# Patient Record
Sex: Male | Born: 1950 | Race: Black or African American | Hispanic: No | Marital: Single | Smoking: Current some day smoker
Health system: Southern US, Community
[De-identification: ages and names within clinical notes are randomized; demographics above are authoritative.]

## PROBLEM LIST (undated history)

## (undated) DIAGNOSIS — N182 Chronic kidney disease, stage 2 (mild): Secondary | ICD-10-CM

## (undated) DIAGNOSIS — I6340 Cerebral infarction due to embolism of unspecified cerebral artery: Secondary | ICD-10-CM

## (undated) DIAGNOSIS — I10 Essential (primary) hypertension: Secondary | ICD-10-CM

## (undated) DIAGNOSIS — E7800 Pure hypercholesterolemia, unspecified: Secondary | ICD-10-CM

## (undated) DIAGNOSIS — A029 Salmonella infection, unspecified: Secondary | ICD-10-CM

## (undated) DIAGNOSIS — E43 Unspecified severe protein-calorie malnutrition: Secondary | ICD-10-CM

## (undated) DIAGNOSIS — C61 Malignant neoplasm of prostate: Secondary | ICD-10-CM

## (undated) DIAGNOSIS — J449 Chronic obstructive pulmonary disease, unspecified: Secondary | ICD-10-CM

## (undated) DIAGNOSIS — I959 Hypotension, unspecified: Secondary | ICD-10-CM

## (undated) DIAGNOSIS — R569 Unspecified convulsions: Secondary | ICD-10-CM

## (undated) DIAGNOSIS — J189 Pneumonia, unspecified organism: Secondary | ICD-10-CM

## (undated) DIAGNOSIS — K635 Polyp of colon: Secondary | ICD-10-CM

## (undated) DIAGNOSIS — I639 Cerebral infarction, unspecified: Secondary | ICD-10-CM

## (undated) DIAGNOSIS — R972 Elevated prostate specific antigen [PSA]: Secondary | ICD-10-CM

## (undated) DIAGNOSIS — A419 Sepsis, unspecified organism: Secondary | ICD-10-CM

## (undated) HISTORY — DX: Salmonella infection, unspecified: A02.9

## (undated) HISTORY — PX: TIBIA FRACTURE SURGERY: SHX806

## (undated) HISTORY — DX: Chronic obstructive pulmonary disease, unspecified: J44.9

## (undated) HISTORY — DX: Essential (primary) hypertension: I10

## (undated) HISTORY — DX: Elevated prostate specific antigen (PSA): R97.20

## (undated) HISTORY — PX: FRACTURE SURGERY: SHX138

## (undated) HISTORY — DX: Polyp of colon: K63.5

## (undated) HISTORY — PX: TOE AMPUTATION: SHX809

## (undated) HISTORY — DX: Malignant neoplasm of prostate: C61

## (undated) HISTORY — DX: Cerebral infarction, unspecified: I63.9

---
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
## 1999-09-13 ENCOUNTER — Encounter: Payer: Self-pay | Admitting: Emergency Medicine

## 1999-09-13 ENCOUNTER — Emergency Department (HOSPITAL_COMMUNITY): Admission: EM | Admit: 1999-09-13 | Discharge: 1999-09-13 | Payer: Self-pay | Admitting: Emergency Medicine

## 1999-10-01 ENCOUNTER — Emergency Department (HOSPITAL_COMMUNITY): Admission: EM | Admit: 1999-10-01 | Discharge: 1999-10-01 | Payer: Self-pay | Admitting: Emergency Medicine

## 1999-10-01 ENCOUNTER — Encounter: Payer: Self-pay | Admitting: Emergency Medicine

## 2001-12-25 ENCOUNTER — Emergency Department (HOSPITAL_COMMUNITY): Admission: EM | Admit: 2001-12-25 | Discharge: 2001-12-25 | Payer: Self-pay | Admitting: Emergency Medicine

## 2001-12-25 ENCOUNTER — Encounter: Payer: Self-pay | Admitting: Emergency Medicine

## 2002-08-18 ENCOUNTER — Encounter: Payer: Self-pay | Admitting: Emergency Medicine

## 2002-08-19 ENCOUNTER — Encounter: Payer: Self-pay | Admitting: Internal Medicine

## 2002-08-19 ENCOUNTER — Inpatient Hospital Stay (HOSPITAL_COMMUNITY): Admission: EM | Admit: 2002-08-19 | Discharge: 2002-08-24 | Payer: Self-pay | Admitting: Emergency Medicine

## 2002-08-21 ENCOUNTER — Encounter: Payer: Self-pay | Admitting: Internal Medicine

## 2002-09-24 ENCOUNTER — Emergency Department (HOSPITAL_COMMUNITY): Admission: EM | Admit: 2002-09-24 | Discharge: 2002-09-24 | Payer: Self-pay | Admitting: Emergency Medicine

## 2002-10-24 ENCOUNTER — Encounter: Admission: RE | Admit: 2002-10-24 | Discharge: 2002-10-24 | Payer: Self-pay | Admitting: Gastroenterology

## 2002-10-24 ENCOUNTER — Encounter: Payer: Self-pay | Admitting: Gastroenterology

## 2003-11-29 ENCOUNTER — Emergency Department (HOSPITAL_COMMUNITY): Admission: EM | Admit: 2003-11-29 | Discharge: 2003-11-29 | Payer: Self-pay | Admitting: Emergency Medicine

## 2003-12-10 ENCOUNTER — Inpatient Hospital Stay (HOSPITAL_COMMUNITY): Admission: EM | Admit: 2003-12-10 | Discharge: 2003-12-21 | Payer: Self-pay | Admitting: Emergency Medicine

## 2003-12-12 ENCOUNTER — Encounter (INDEPENDENT_AMBULATORY_CARE_PROVIDER_SITE_OTHER): Payer: Self-pay | Admitting: *Deleted

## 2007-03-21 ENCOUNTER — Emergency Department (HOSPITAL_COMMUNITY): Admission: EM | Admit: 2007-03-21 | Discharge: 2007-03-21 | Payer: Self-pay | Admitting: Emergency Medicine

## 2007-03-27 ENCOUNTER — Emergency Department (HOSPITAL_COMMUNITY): Admission: EM | Admit: 2007-03-27 | Discharge: 2007-03-27 | Payer: Self-pay | Admitting: Emergency Medicine

## 2007-05-03 ENCOUNTER — Emergency Department (HOSPITAL_COMMUNITY): Admission: EM | Admit: 2007-05-03 | Discharge: 2007-05-03 | Payer: Self-pay | Admitting: Emergency Medicine

## 2007-05-18 ENCOUNTER — Encounter: Payer: Self-pay | Admitting: Family Medicine

## 2007-05-18 ENCOUNTER — Ambulatory Visit: Payer: Self-pay | Admitting: Family Medicine

## 2007-05-18 DIAGNOSIS — J449 Chronic obstructive pulmonary disease, unspecified: Secondary | ICD-10-CM | POA: Insufficient documentation

## 2007-05-18 DIAGNOSIS — I10 Essential (primary) hypertension: Secondary | ICD-10-CM

## 2007-05-18 HISTORY — DX: Essential (primary) hypertension: I10

## 2007-05-18 HISTORY — DX: Chronic obstructive pulmonary disease, unspecified: J44.9

## 2007-05-18 LAB — CONVERTED CEMR LAB
ALT: 48 units/L (ref 0–53)
AST: 41 units/L — ABNORMAL HIGH (ref 0–37)
Alkaline Phosphatase: 79 units/L (ref 39–117)
BUN: 12 mg/dL (ref 6–23)
Chloride: 102 meq/L (ref 96–112)
Creatinine, Ser: 1.07 mg/dL (ref 0.40–1.50)
PSA: 9.58 ng/mL — ABNORMAL HIGH (ref 0.10–4.00)
Potassium: 4.7 meq/L (ref 3.5–5.3)
Total Protein: 8.6 g/dL — ABNORMAL HIGH (ref 6.0–8.3)

## 2007-05-25 ENCOUNTER — Ambulatory Visit: Payer: Self-pay | Admitting: Family Medicine

## 2007-05-25 ENCOUNTER — Encounter (INDEPENDENT_AMBULATORY_CARE_PROVIDER_SITE_OTHER): Payer: Self-pay | Admitting: Family Medicine

## 2007-05-25 DIAGNOSIS — R972 Elevated prostate specific antigen [PSA]: Secondary | ICD-10-CM

## 2007-05-25 HISTORY — DX: Elevated prostate specific antigen (PSA): R97.20

## 2007-05-25 LAB — CONVERTED CEMR LAB
Cholesterol: 178 mg/dL (ref 0–200)
Triglycerides: 57 mg/dL (ref <150)
VLDL: 11 mg/dL (ref 0–40)

## 2007-05-27 ENCOUNTER — Encounter (INDEPENDENT_AMBULATORY_CARE_PROVIDER_SITE_OTHER): Payer: Self-pay | Admitting: Family Medicine

## 2007-06-01 ENCOUNTER — Encounter (INDEPENDENT_AMBULATORY_CARE_PROVIDER_SITE_OTHER): Payer: Self-pay | Admitting: *Deleted

## 2007-06-10 ENCOUNTER — Encounter (INDEPENDENT_AMBULATORY_CARE_PROVIDER_SITE_OTHER): Payer: Self-pay | Admitting: *Deleted

## 2009-04-07 ENCOUNTER — Emergency Department (HOSPITAL_COMMUNITY): Admission: EM | Admit: 2009-04-07 | Discharge: 2009-04-07 | Payer: Self-pay | Admitting: Emergency Medicine

## 2009-10-18 ENCOUNTER — Emergency Department (HOSPITAL_COMMUNITY): Admission: EM | Admit: 2009-10-18 | Discharge: 2009-10-18 | Payer: Self-pay | Admitting: Emergency Medicine

## 2009-11-19 ENCOUNTER — Emergency Department (HOSPITAL_COMMUNITY): Admission: EM | Admit: 2009-11-19 | Discharge: 2009-11-19 | Payer: Self-pay | Admitting: Emergency Medicine

## 2009-12-05 ENCOUNTER — Emergency Department (HOSPITAL_COMMUNITY): Admission: EM | Admit: 2009-12-05 | Discharge: 2009-12-05 | Payer: Self-pay | Admitting: Emergency Medicine

## 2010-10-14 ENCOUNTER — Inpatient Hospital Stay (HOSPITAL_COMMUNITY)
Admission: EM | Admit: 2010-10-14 | Discharge: 2010-10-16 | DRG: 066 | Disposition: A | Discharge status: Home / Self Care | Payer: Self-pay | Attending: Family Medicine | Admitting: Family Medicine

## 2010-10-14 ENCOUNTER — Emergency Department (HOSPITAL_COMMUNITY): Payer: Self-pay

## 2010-10-14 ENCOUNTER — Observation Stay (HOSPITAL_COMMUNITY): Payer: Self-pay

## 2010-10-14 DIAGNOSIS — S98139A Complete traumatic amputation of one unspecified lesser toe, initial encounter: Secondary | ICD-10-CM

## 2010-10-14 DIAGNOSIS — F17200 Nicotine dependence, unspecified, uncomplicated: Secondary | ICD-10-CM | POA: Diagnosis present

## 2010-10-14 DIAGNOSIS — E785 Hyperlipidemia, unspecified: Secondary | ICD-10-CM | POA: Diagnosis present

## 2010-10-14 DIAGNOSIS — Z880 Allergy status to penicillin: Secondary | ICD-10-CM

## 2010-10-14 DIAGNOSIS — F1010 Alcohol abuse, uncomplicated: Secondary | ICD-10-CM | POA: Diagnosis present

## 2010-10-14 DIAGNOSIS — I6350 Cerebral infarction due to unspecified occlusion or stenosis of unspecified cerebral artery: Principal | ICD-10-CM | POA: Diagnosis present

## 2010-10-14 DIAGNOSIS — Z7902 Long term (current) use of antithrombotics/antiplatelets: Secondary | ICD-10-CM

## 2010-10-14 DIAGNOSIS — R29898 Other symptoms and signs involving the musculoskeletal system: Secondary | ICD-10-CM | POA: Diagnosis present

## 2010-10-14 DIAGNOSIS — Z8673 Personal history of transient ischemic attack (TIA), and cerebral infarction without residual deficits: Secondary | ICD-10-CM | POA: Insufficient documentation

## 2010-10-14 DIAGNOSIS — F1410 Cocaine abuse, uncomplicated: Secondary | ICD-10-CM | POA: Diagnosis present

## 2010-10-14 DIAGNOSIS — I10 Essential (primary) hypertension: Secondary | ICD-10-CM | POA: Diagnosis present

## 2010-10-14 LAB — DIFFERENTIAL
Basophils Absolute: 0 10*3/uL (ref 0.0–0.1)
Eosinophils Relative: 1 % (ref 0–5)
Lymphocytes Relative: 34 % (ref 12–46)
Monocytes Relative: 10 % (ref 3–12)
Neutro Abs: 4.6 10*3/uL (ref 1.7–7.7)
Neutrophils Relative %: 54 % (ref 43–77)

## 2010-10-14 LAB — CK TOTAL AND CKMB (NOT AT ARMC): CK, MB: 2.5 ng/mL (ref 0.3–4.0)

## 2010-10-14 LAB — CBC
HCT: 44.9 % (ref 39.0–52.0)
Hemoglobin: 15.2 g/dL (ref 13.0–17.0)
MCH: 29.9 pg (ref 26.0–34.0)
MCHC: 33.9 g/dL (ref 30.0–36.0)
MCV: 88.2 fL (ref 78.0–100.0)
Platelets: 278 10*3/uL (ref 150–400)

## 2010-10-14 LAB — URINALYSIS, ROUTINE W REFLEX MICROSCOPIC
Bilirubin Urine: NEGATIVE
Hgb urine dipstick: NEGATIVE
Specific Gravity, Urine: 1.023 (ref 1.005–1.030)
Urobilinogen, UA: 1 mg/dL (ref 0.0–1.0)

## 2010-10-14 LAB — COMPREHENSIVE METABOLIC PANEL
Albumin: 3.5 g/dL (ref 3.5–5.2)
Alkaline Phosphatase: 74 U/L (ref 39–117)
Chloride: 105 mEq/L (ref 96–112)
Creatinine, Ser: 0.98 mg/dL (ref 0.4–1.5)
GFR calc Af Amer: >60 mL/min (ref >60)
GFR calc non Af Amer: >60 mL/min (ref >60)
Glucose, Bld: 87 mg/dL (ref 70–99)
Total Protein: 8.3 g/dL (ref 6.0–8.3)

## 2010-10-14 LAB — TROPONIN I: Troponin I: 0.02 ng/mL (ref 0.00–0.06)

## 2010-10-14 LAB — APTT: aPTT: 28 seconds (ref 24–37)

## 2010-10-15 DIAGNOSIS — I6350 Cerebral infarction due to unspecified occlusion or stenosis of unspecified cerebral artery: Secondary | ICD-10-CM

## 2010-10-15 LAB — URINE DRUGS OF ABUSE SCREEN W ALC, ROUTINE (REF LAB)
Barbiturate Quant, Ur: NEGATIVE
Cocaine Metabolites: POSITIVE — AB
Creatinine,U: 208.8 mg/dL
Methadone: NEGATIVE

## 2010-10-15 LAB — LIPID PANEL
Cholesterol: 175 mg/dL (ref 0–200)
LDL Cholesterol: 102 mg/dL — ABNORMAL HIGH (ref 0–99)
Total CHOL/HDL Ratio: 3.1 RATIO
VLDL: 17 mg/dL (ref 0–40)

## 2010-10-20 NOTE — Discharge Summary (Signed)
NAME:  [name], [name] NO.:  [id number]  MEDICAL RECORD NO.:  [id number]           PATIENT TYPE:  I  LOCATION:  3020                         FACILITY:  [facility]  PHYSICIAN:  Brendia Sacks, MD    DATE OF BIRTH:  08-09-1951  DATE OF ADMISSION:  10/14/2010 DATE OF DISCHARGE:  10/16/2010                              DISCHARGE SUMMARY   CONDITION ON DISCHARGE:  Improved.  PRIMARY CARE PHYSICIAN:  None but he will be establishing with [facility].  DISCHARGE DIAGNOSES: 1. Acute ischemic stroke secondary to untreated hypertension and     likely secondary to cocaine use. 2. Uncontrolled hypertension, now stable. 3. Cocaine use. 4. Polysubstance abuse. 5. Hyperlipidemia. 6. Cigarette smoker. 7. Mild elevation of AST and ALT of unclear significance.  HISTORY OF PRESENT ILLNESS:  This is a 59 year old male who presented to the emergency room with complaint of 3 days of left-sided weakness. Denied slurred speech and felt his condition had been improving prior to admission.  He did admit to recent cocaine use.  Exam was unremarkable on admission by review of history and physical.  HOSPITAL COURSE: 1. Acute ischemic stroke.  The patient was admitted and further     evaluation and testing did reveal an ischemic stroke.  Presumably     secondary to the patient's uncontrolled hypertension and possible     vasospasm from cocaine.  He was strongly counseled by myself, as     well as the admitting physician and nurse, the link between cocaine     and vascular disease in likelihood of inducing further stroke with     cocaine use.  I also counseled him on polysubstance abuse.  His     preliminary report of his vascular studies of his carotids was     unremarkable.  Unfortunately, a 2-D echocardiogram could not be     completed in a timely fashion and this will be deferred to the     outpatient setting.  The patient will be placed on statin, and he     will be started on  blood pressure medications.  He is allergic to     ASPIRIN, so he has been started on Plavix.  Neurologic exam was     nonfocal.  On discharge, he is feeling quite well and has no neuro     deficits.  He was evaluated by physical therapy with no outpatient     needs identified.  He will be evaluated by occupational therapy     prior to discharge.  However, physical therapy did note that they     did not feel that the patient had any occupational therapy needs.     It was felt that he was at his baseline per physical therapy which     I have discussed the case with.  He does not need a formal speech     evaluation as he has had no dysphagia.  He passed a swallow screen,     and his speech is fluent and clear.  His cognitive function seems     grossly normal. 2. Uncontrolled hypertension.  This has improved.  He has been started     on daily medications, and he will follow up in the outpatient     setting.  I have discussed that both medications he has been placed     on can be obtained at [facility] for 4 dollars a month.  He has not     been started on a beta-blocker secondary to his history of cocaine     use. 3. Polysubstance abuse including 4-5 drinks of alcohol a day, six     cigarettes a day, and cocaine use.  I have counseled him strongly     on cessation of cocaine and tobacco as well as no more than two     drinks per day. 4. Hyperlipidemia.  He will be started on statin.  CONSULTATIONS:  None.  PROCEDURES:  None.  IMAGING: 1. CT of the head on October 14, 2010:  No evidence of acute intracranial     abnormality. 2. MRI of the brain on October 14, 2010:  Moderate sized acute     nonhemorrhagic right centrum semiovale infarct.  Prominent white     matter type changes.  Remote small right cerebellar infarct. 3. MRA of the head on October 14, 2010:  Motion degraded with     atherosclerotic-type changes as detailed in report.  PERTINENT LABORATORY STUDIES: 1. Total cholesterol 175, LDL  102, triglycerides within normal limits. 2. Drug screen positive for cocaine. 3. Hemoglobin A1c 5.6. 4. Serum alcohol negative on admission. 5. Urinalysis negative. 6. AST and ALT mildly elevated, 66 and 60 respectively.  Review of     records does demonstrate a previously elevated ALT.  PHYSICAL EXAMINATION ON DISCHARGE:  GENERAL:  The patient is eating well, speaking well.  He has no focal neurologic deficits.  He has no complaints. VITAL SIGNS:  Temperature is 97.9, pulse 81, respirations 18, blood pressure 195/95, sat 94% on room air. CARDIOVASCULAR:  Regular rate and rhythm.  No murmur, rub, or gallop. RESPIRATORY:  Clear to auscultation bilaterally.  No wheezes, rales, or rhonchi.  Normal respiratory effort. NEUROLOGIC:  Tone and strength in the upper and lower extremities is 5/5 and symmetric.  No dysdiadochokinesis.  Cranial nerves II through XII are intact.  Speech is fluent and clear.  Strength in bilateral upper and lower extremities is 5/5 and symmetric.  DISCHARGE INSTRUCTIONS:  The patient will be discharged home today. Diet is a heart-healthy diet.  Activities unrestricted.  He should follow up with [facility] which case manager will wake an appointment and follow up for an outpatient echocardiogram which will be arranged prior to discharge.  He has been strongly counseled no cocaine or illegal drugs, no more than 2 drinks of alcohol per day and no smoking.  DISCHARGE MEDICATIONS: 1. Plavix 75 mg p.o. daily with meal, 30-day supply with one refill     prescription given. 2. Hydrochlorothiazide 25 mg p.o. daily. 3. Lisinopril 10 mg p.o. daily. 4. Simvastatin 20 mg p.o. daily.  TIME COORDINATING DISCHARGE:  35 minutes.     Brendia Sacks, MD     DG/MEDQ  D:  10/16/2010  T:  10/17/2010  Job:  [id number]  cc:   [facility]  Electronically Signed by Brendia Sacks  on 10/20/2010 08:32:01 PM

## 2010-10-21 NOTE — H&P (Signed)
NAME:  [name], [name]                ACCOUNT NO.:  [id number]  MEDICAL RECORD NO.:  [id number]          PATIENT TYPE:  LOCATION:                                 FACILITY:  PHYSICIAN:  Erick Blinks, MD     DATE OF BIRTH:  May 18, 1951  DATE OF ADMISSION:  10/14/2010 DATE OF DISCHARGE:                             HISTORY & PHYSICAL   PRIMARY CARE PHYSICIAN:  The patient does not have a primary care physician.  CHIEF COMPLAINT:  Left-sided weakness.  HISTORY OF PRESENT ILLNESS:  This is a 60 year old African American male with history of hypertension, polysubstance abuse, who presents to the emergency room with complaints of left-sided weakness.  Currently, the patient's symptoms started on Saturday, approximately 3 days ago, when he started notice left-sided weakness and heaviness in his arm as well as his leg.  The patient was having difficulty walking.  He denies any changes in vision, slurring of speech, and not having chest pain, shortness of breath, or nausea and vomiting.  Over the course of the last few days, he has been somewhat improving.  He reports today to the emergency room for evaluation.  The patient denies any fever, shortness of breath, cough, nausea, vomiting, diarrhea, abdominal pain, dysuria, any new skin outbreaks, or any other complaints other than his weakness. He has not had any numbness, changes in vision, double vision, difficulty swallowing, or any other complaints.  PAST MEDICAL HISTORY:  The patient has a history of hypertension, although, he is not taking any current medication.  He has not seen a doctor in many many years, and otherwise has a history of COPD, but again not on any medication.  The patient also has a history of tobacco abuse as well as alcohol abuse.  ALLERGIES: 1. ASPIRIN, which causes itching and rash. 2. PENICILLIN, which also causes itching and rash.  MEDICATIONS PRIOR TO ADMISSION:  None.  FAMILY HISTORY:  Mother died of an  MI.  Father died in a motor vehicle accident.  SOCIAL HISTORY:  The patient is an active smoker.  He drinks approximately 6-pack per day.  His last drink was yesterday.  He reports that he has not had any tremors or signs of delirium tremens when he stops drinking.  He also uses cocaine recreationally, his last use was on Friday.  REVIEW OF SYSTEMS:  As per HPI.  PHYSICAL EXAMINATION:  VITAL SIGNS:  Temperature 98.3, blood pressure 159/104, heart rate of 86, respiratory rate of 18, pulse ox 99% on room air. GENERAL:  The patient in no acute distress, lying comfortably in bed. HEENT:  Normocephalic, atraumatic.  Pupils are equal, round, and reactive to light. NECK:  Supple. CHEST:  Clear to auscultation bilaterally. CARDIAC:  Shows S1 and S2 with regular rate and rhythm. ABDOMEN:  Soft, nontender.  Bowel sounds are active. EXTREMITIES:  Show no cyanosis, clubbing, or edema. NEUROLOGIC:  The patient has 5/5 strength bilaterally.  No facial symmetry.  Cranial nerves II through XII are grossly intact.  Plantars are downgoing. SKIN:  Warm.  LABS:  WBC 8.5, hemoglobin 16.2, platelet count of 278.  INR of  0.92. Sodium 136, potassium 4.1, chloride 105, bicarb 23, glucose 87, BUN 13, creatinine 0.98.  Liver function tests within normal limits.  Albumin of 3.5.  Calcium of 9.9.  Cardiac point-of-care markers are negative.  CT head shows no evidence of acute intracranial abnormality.  ASSESSMENT AND PLAN: 1. Left-sided weakness, rule out transient ischemic attack. 2. Tobacco abuse. 3. Alcohol abuse. 4. Cocaine abuse. 5. Hypertension.  PLAN:  We will admit the patient to complete his TIA workup including MRI, MRA, carotid Dopplers, and 2D echocardiogram.  His left-sided weakness was likely secondary to vasospasm from the cocaine use, he had done the night before the onset of his symptoms.  The patient has been counseled extensively regarding the use and abuse of cocaine.  He  was offered nicotine patch for tobacco abuse, he has refused this.  He will be placed on thiamine and folate for his alcohol abuse.  We will monitor him clinically for any signs of withdrawal.  He will be started on Norvasc for his blood pressure.  We will avoid beta-blockers due to his cocaine use and we will start him on Plavix for antiplatelet therapy due to his aspirin allergy.  Further orders per the clinical course.     Erick Blinks, MD     JM/MEDQ  D:  10/14/2010  T:  10/14/2010  Job:  [id number]  Electronically Signed by Durward Mallard Dailey Buccheri  on 10/21/2010 10:11:06 PM

## 2010-10-22 ENCOUNTER — Ambulatory Visit (HOSPITAL_COMMUNITY): Payer: Self-pay | Attending: Family Medicine

## 2010-10-28 LAB — CBC
HCT: 42.8 % (ref 39.0–52.0)
Hemoglobin: 14.3 g/dL (ref 13.0–17.0)
RBC: 4.79 MIL/uL (ref 4.22–5.81)
RDW: 13.9 % (ref 11.5–15.5)

## 2010-10-28 LAB — DIFFERENTIAL
Eosinophils Relative: 7 % — ABNORMAL HIGH (ref 0–5)
Lymphocytes Relative: 26 % (ref 12–46)
Lymphs Abs: 2.4 10*3/uL (ref 0.7–4.0)
Monocytes Absolute: 0.5 10*3/uL (ref 0.1–1.0)
Monocytes Relative: 6 % (ref 3–12)
Neutro Abs: 5.5 10*3/uL (ref 1.7–7.7)

## 2010-10-28 LAB — BASIC METABOLIC PANEL
Calcium: 9.6 mg/dL (ref 8.4–10.5)
GFR calc Af Amer: >60 mL/min (ref >60)
GFR calc non Af Amer: >60 mL/min (ref >60)
Glucose, Bld: 119 mg/dL — ABNORMAL HIGH (ref 70–99)
Potassium: 3.5 mEq/L (ref 3.5–5.1)
Sodium: 138 mEq/L (ref 135–145)

## 2010-11-03 LAB — BASIC METABOLIC PANEL
BUN: 10 mg/dL (ref 6–23)
CO2: 26 mEq/L (ref 19–32)
Chloride: 108 mEq/L (ref 96–112)
Creatinine, Ser: 1.04 mg/dL (ref 0.4–1.5)
Glucose, Bld: 93 mg/dL (ref 70–99)
Potassium: 4.5 mEq/L (ref 3.5–5.1)

## 2010-11-03 LAB — DIFFERENTIAL
Basophils Relative: 1 % (ref 0–1)
Eosinophils Absolute: 0.4 10*3/uL (ref 0.0–0.7)
Eosinophils Relative: 5 % (ref 0–5)
Lymphs Abs: 2.9 10*3/uL (ref 0.7–4.0)
Neutrophils Relative %: 53 % (ref 43–77)

## 2010-11-03 LAB — CBC
HCT: 42.1 % (ref 39.0–52.0)
MCHC: 33.9 g/dL (ref 30.0–36.0)
MCV: 89.8 fL (ref 78.0–100.0)
Platelets: 281 10*3/uL (ref 150–400)
RDW: 14.6 % (ref 11.5–15.5)
WBC: 9.1 10*3/uL (ref 4.0–10.5)

## 2010-12-26 NOTE — Discharge Summary (Signed)
NAME:  Terry, Pittman                          ACCOUNT NO.:  [id number]   MEDICAL RECORD NO.:  [id number]                   PATIENT TYPE:  INP   LOCATION:  5006                                 FACILITY:  [facility]   PHYSICIAN:  Sherin Quarry, MD                   DATE OF BIRTH:  1951-02-20   DATE OF ADMISSION:  08/18/2002  DATE OF DISCHARGE:                                 DISCHARGE SUMMARY   HISTORY:  [name] is a 60 year old man, who initially presented to  [facility] Emergency Room on August 18, 2002, with a four day history of  cough, chest discomfort somewhat pleuritic in nature, fever, and production  of greenish phlegm with associated anorexia and malaise.  The patient has a  history of heroin, cocaine, and alcohol abuse and is a heavy smoker.  On  presentation to the emergency room, a chest x-ray was obtained which  revealed evidence of a right upper lobe consolidation, consistent with  pneumonia.  The patient's past history was remarkable for COPD and  hypertension.  He is reported to be ALLERGIC to PENICILLIN and ASPIRIN.   PHYSICAL EXAMINATION AS DESCRIBED BY KARRAR HUSAIN,M.D.:  VITAL SIGNS:  Temperature 97, blood pressure 122/73, pulse 100, respirations 20, O2  saturations 87% on room air.  HEENT:  Within normal limits.  CHEST:  Decreased breath sounds in the right upper portion of the chest,  particularly anteriorly.  Left lung rhonchi were identified.  CARDIOVASCULAR:  Normal S1, S2.  No murmurs, rubs, or gallops.  ABDOMEN:  Benign.  NEUROLOGIC:  Normal.  EXTREMITIES:  Normal.   LABORATORY DATA:  Arterial blood gas showed a pO2 of 51, pCO2 of 29 on room  air.  The white count was 18,400 with a significant left shift.  Hemoglobin  was 12.3, hematocrit was 37.1.  Serial blood cultures were obtained.  Blood  cultures grew a Streptococcus pneumoniae species.  The sputum was cultured,  and this also grew a Streptococcus pneumoniae.  Sensitivities in the  specimen  revealed that it was penicillin-sensitive.   HOSPITAL COURSE:  On admission, the patient was placed on Rocephin 1 g every  12 hours and Zithromax 500 mg every 24 hours.  He was given nebulizer  treatments and oxygen.  A nicotine patch was placed to encourage the patient  to discontinue smoking.  Subsequently, when the report of the positive blood  cultures was known, the patient was shifted from Zithromax and Rocephin to  Rocephin alone.  He tolerated these medications well and had no apparent  signs of drug or alcohol withdrawal.  For about 48 hours prior to discharge,  he had been afebrile.  On August 24, 2002, the patient was feeling well.  His appetite had improved.  He was having no respiratory difficulty.  He was  afebrile.  He denied chest pain.  He emphatically promised that he would  take antibiotic medicine when he was discharged.  Therefore, on August 24, 2002, the patient was discharged.   DISCHARGE DIAGNOSES:  1. Right upper lobe consolidation, pneumococcal pneumonia.  2. Pneumococcal sepsis.  3. Chronic obstructive pulmonary disease with history of cigarette smoking.  4. History of drug and alcohol abuse.   DISCHARGE MEDICATIONS:  Ceftin 250 mg b.i.d. x 5 additional days.  The  patient will be encouraged to continue the use of the nicotine patch.   FOLLOW UP:  [facility].  It will be important for this patient to have a  follow-up chest x-ray in about 6-8 weeks to document the clearing of the  upper lobe consolidation.  It should be pointed out that during the course  of his hospitalization, he also had a CT scan of the chest which did not  suggest any underlying mass or bronchial obstruction.   CONDITION ON DISCHARGE:  Good.                                               Sherin Quarry, MD    SY/MEDQ  D:  08/24/2002  T:  08/24/2002  Job:  [id number]   cc:   Dala Dock

## 2010-12-26 NOTE — H&P (Signed)
NAME:  [name], [name]                          ACCOUNT NO.:  [id number]   MEDICAL RECORD NO.:  [id number]                   PATIENT TYPE:  EMS   LOCATION:  MINO                                 FACILITY:  [facility]   PHYSICIAN:  Georgann Housekeeper, M.D.                 DATE OF BIRTH:  07/14/1951   DATE OF ADMISSION:  08/18/2002  DATE OF DISCHARGE:                                HISTORY & PHYSICAL   CHIEF COMPLAINT:  Shortness of breath, fevers and cough.   The patient is a 60 year old male with a four day history of cough, chest  pain with coughing, fevers, with positive sweating, vomiting and nausea,  poor appetite.  He has a history of heroin and cocaine use, alcohol and  tobacco use.  He presented to the emergency room with those complaints and  was found to have infiltrates,  dense in the right upper lobe and also  nodules on the right lung field, new from previous.  He has been mildly  hypoxic.   PAST MEDICAL HISTORY:  History of hypertension, COPD and bronchitis.   MEDICATIONS:  None.   ALLERGIES:  Penicillin and aspirin.   PAST SURGICAL HISTORY:  None.   SOCIAL HISTORY:  He lives with a girlfriend.  From [place].  He smokes  one pack a day since the age of 41.  Positive cocaine, heroin.  Positive  alcohol.  He works as a Audiological scientist, no children.   FAMILY HISTORY:  Mother died of an MI, father died in a motor vehicle  accident.   REVIEW OF SYSTEMS:  Has some weight loss, fever and night sweats.   PHYSICAL EXAMINATION:  VITAL SIGNS:  Temperature 97, blood pressure 122/73,  pulse 100, respirations 20, saturations 87% on room air, when he came in was  94% but subsequently was checked again and it was 87% and on two liters 94%.  GENERAL:  Awake and alert, in no acute distress.  Ill-appearing.  OROPHARYNX:  Mucous membranes were dry.  NECK:  Supple, no adenopathy.  LUNGS:  Decreased breath sounds in the right mid lower lung field.  Left  lung crackles  at the bottom.  CARDIAC:  Regular rhythm, S1 and S2 without any murmurs.  EXTREMITIES:  No edema.  ABDOMEN:  Soft, epigastric tenderness without any masses or bruits.  NEUROLOGICAL:  Nonfocal.   LABORATORY DATA:  Hemoglobin 14, white count 8.8, platelets 206.  Alcohol  level less than 5, CK 100.  Chemistries showed sodium 135, potassium 4.9,  BUN 40, creatinine 2.8, glucose of 107.  ABG on room air showed 7.46 pH,  pCO2 of 29, pO2 of 51, 89% saturations.  EKG showed nonspecific T-wave  changes, normal sinus rhythm.  Chest x-ray showed right upper lobe dense  infiltrates with questionable mass and the left lung nodule in mid field,  new from a previous x-ray.   IMPRESSION:  60 year old male with a history of tobacco use, heroin, and  cocaine who presented with dense infiltrates, renal failure with pre-renal  azotemia.  1. Pneumonia.  2. Renal failure.  3. Dehydration.  4. Pneumonia, questionably community acquired versus post obstructive with a     question of nodule and underlying probable, rule out mass.   PLAN:  IV antibiotic of Rocephin and Zithromax.  Nebulizer.  O2.  IV fluids  and aggressive hydration.  Follow renal function.  As far as secondary to  his creatinine elevation, hold off the CAT scan.  If the creatinine does not  resolve, he might need a pulmonary consult for bronchoscopy.  As far as  ETOH, cocaine and heroin use, I would give him some Ativan for agitation  p.r.n., fluids.  As far as nausea and vomiting, will put him on Phenergan  secondary to amylase, lipase and LFTs.  Protonix 40 mg q.d.                                               Georgann Housekeeper, M.D.    [name]  D:  08/19/2002  T:  08/19/2002  Job:  [id number]

## 2010-12-26 NOTE — Discharge Summary (Signed)
NAME:  [name], [name]                          ACCOUNT NO.:  [id number]   MEDICAL RECORD NO.:  [id number]                   PATIENT TYPE:  INP   LOCATION:  5711                                 FACILITY:  [facility]   PHYSICIAN:  Blanch Media, M.D.             DATE OF BIRTH:  [birth year]   DATE OF ADMISSION:  12/10/2003  DATE OF DISCHARGE:  12/21/2003                                 DISCHARGE SUMMARY   CONSULTING PHYSICIAN:  Mark C. Ophelia Charter, M.D., [facility].   FINAL DIAGNOSES:  1. Cellulitis of the right foot.  2. 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  3. Amputation of the third toe.  4. Methicillin-resistant Staphylococcus aureus infection.  5. Tobacco abuse.  6. Cocaine abuse.  7. Hypertension.   PRINCIPAL PROCEDURES:  1. MRI of the right foot showed osteomyelitis of the third and fourth head     of the metatarsals and obvious cellulitis of the dorsum of the right     foot.  2. Arterial Dopplers of the right and left foot within normal limits with     ABIs greater than 1.  3. Amputation of the right third toe with Wound Vac application.   LABORATORY DATA:  Wound cultures shows methicillin-resistant Staphylococcus  aureus sensitive to clindamycin.  WBC 9.0, hemoglobin 11. 9, hematocrit  35.2, platelets 432,000.  Sodium 136, potassium 3.4, chloride 104, CO2 29,  BUN 9, creatinine 0.9.  Urine drug screen positive for cocaine.   HOSPITAL COURSE:  PROBLEM #1:  RIGHT FOOT CELLULITIS AND RIGHT OSTEOMYELITIS  OF THE RIGHT THIRD AND FOURTH METATARSALS:  In short, Mr. Matheney had been  seen in the emergency room several weeks prior to admission and had been  treated with a course of Keflex for right foot cellulitis.  He had not  completed his course of antibiotics, however, and represented on the day of  admission with complaints of increasing right foot pain.  He was evaluated  with right foot MRI and found to have osteomyelitis of the right toes.  He  was started  on Ancef and received approximately one week's worth of Ancef  and on approximately day #4 of Ancef the patient underwent amputation of the  third toe by Dr. Ophelia Charter secondary to gangrenous progressive of the  cellulitis.  Cultures of the wound returned methicillin-resistant  Staphylococcus aureus, and the patient was immediately started on vancomycin  with rapid improvement of the infection.  Wound vac was placed on the wound  at the time of amputation and the patient's cultures sent for clindamycin  sensitivity.  The Wound vac was maintained for some time and at the time of  removal the patient's wound showed excellent granulation tissue and  excellent resolution of the cellulitis on the dorsum of the foot.  It was  thought that this patient's cellulitis was greatly impacted by the patient's  cocaine abuse.  This issue will be addressed  below.  The patient will follow  up for further addressing  of closure of this wound with Dr. Ophelia Charter on Jan 02, 2004, at 9:15 a.m.   PROBLEM #2:  HYPERTENSION:  Up admission, the patient had extremely high  blood pressures in the low 200s over 120s.  He was initially started on  clonidine for the first couple of days of admission and then transitioned  over to hydrochlorothiazide and at discharge his blood pressures were under  relatively good control in the 140/80s on hydrochlorothiazide 25 mg p.o.  every day.   PROBLEM #3:  TOBACCO ABUSE:  The patient was maintained on a nicotine patch  21 mcg every day throughout his hospitalization.  The patient was counseled  on the importance of tobacco cessation.   PROBLEM #4:  COCAINE ABUSE:  The patient states that he has been abusing  cocaine for greater than 10 years throughout his life.  He states that at  some point in his life he has used on a daily basis.  The patient was made  aware if he continues to use upon discharge that it is likely that it will  greatly affect the blood supply to his healing foot  and that it could  compromise his right foot and result in further damage to this foot.  We had  care management visit the patient, and the patient was supplied with  information for ADS as well as other numbers for support for drug cessation  program.   PROBLEM #5:  PAIN:  The patient had a very minimal amount of pain throughout  his hospital course and used very limited amount of pain and on occasion he  would require Dilaudid with vacuum changes and was discharged on no pain  medications.   DISCHARGE INSTRUCTIONS:  Instructions to patient and family:  The patient  was instructed to follow up with myself, Dr. Gwenyth Bouillon, at [facility], on January 10, 2004, at 1:45 p.m. and then was instructed to  attend an intake appointment at [facility] at which time he would be  established with the physicians there.  He will be established with a  pharmacy and better services at [facility].  He is also to follow up with  Dr. Ophelia Charter on Jan 02, 2004, at 9:15 a.m.  The patient was also set up with  home health services for wound changes.  The patient was instructed to stop  smoking and also to stop using cocaine.   DISCHARGE MEDICATIONS:  1. Hydrochlorothiazide 25 mg one tablet p.o. every day.  2. Clindamycin 300 mg p.o. t.i.d. for two weeks' time.   <DNR STATUS/>  Full code.      Penni Bombard, MD                          Blanch Media, M.D.    SJ/MEDQ  D:  12/21/2003  T:  12/22/2003  Job:  [id number]   cc:   [facility]  [place], [place]

## 2010-12-26 NOTE — Consult Note (Signed)
NAME:  [name], [name]                          ACCOUNT NO.:  [id number]   MEDICAL RECORD NO.:  [id number]                   PATIENT TYPE:  INP   LOCATION:  5711                                 FACILITY:  [facility]   PHYSICIAN:  Mark C. Ophelia Charter, M.D.                 DATE OF BIRTH:  25-Jun-1951   DATE OF CONSULTATION:  12/11/2003  DATE OF DISCHARGE:                                   CONSULTATION   CHIEF COMPLAINT:  Right foot cellulitis with ulceration.   SUBJECTIVE:  [name] is a 60 year old black male who works as a Software engineer.  He apparently started developing pain in his right foot  approximately one month ago.  He presented to the emergency department  around the end of April and was treated with Keflex for cellulitis.  He  states that the right foot did improve some after these antibiotics.  However, recently, the patient's pain started to increase, he started having  difficulty with walking and then drainage from his right third and fourth  toes.  He has been soaking his foot in Epsom salt and alcohol and has been  rubbing the third and fourth toes with his hands.  He presented on Dec 10, 2003 with these symptoms.  X-rays were taken and showed no evidence of any  osteomyelitis.  The patient denied any injury to his foot and denied any  history of gout or diabetes.  Arterial Dopplers were ordered.  Ankle  brachial indices were within normal limits bilaterally.  IV Ancef has been  started and the patient is now on his second day of IV antibiotics.   PAST MEDICAL HISTORY:  Unremarkable.   CURRENT MEDICATIONS:  None.   ALLERGIES:  1. ASPIRIN.  2. PENICILLIN.   SOCIAL HISTORY:  Positive for smoking tobacco and cocaine.  He also admitted  to drinking one pint of alcohol a day.  He denies any IV type drug use.  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   FAMILY HISTORY:  Unremarkable.   REVIEW OF SYSTEMS:  Positive for pain, swelling, erythema and edema of  the  patient's right foot.   OBJECTIVE:  [name] is a 60 year old black male.  Examination of his right  foot demonstrates skin breakdown extensively in the dorsum of the foot.  The  patient states that this is secondary to soaking his foot at home in Epsom  salt and alcohol.  He has an ulceration on the dorsum of the third toe and  in the web spaces of the third and fourth toes.  There appears to be no odor  from these.  The right foot appears to be erythematous to the ankle.  He is  able to wiggle his toes; denies any numbness or tingling.  Lower extremity  strength is within normal limits.   ASSESSMENT:  1. Right foot cellulitis.  2. Right third and fourth toe ulcers, rule out osteomyelitis.   SECONDARY DIAGNOSES:  1. Hypertension.  2. Tobacco use.  3. Substance abuse.   PLAN:  We will continue the IV Ancef at this time.  MRI scans will be  reviewed to rule out osteomyelitis.  The patient may require debridement or  possible amputation depending on the results of the MRI.  Further  recommendations will be made pending the review of the scan.  Thank you for  allowing [place] to participate in this consultation.     Sandrea Matte, P.A.                       Mark C. Ophelia Charter, M.D.    JH/MEDQ  D:  12/11/2003  T:  12/12/2003  Job:  [id number]

## 2010-12-26 NOTE — Op Note (Signed)
NAME:  [name], [name]                          ACCOUNT NO.:  [id number]   MEDICAL RECORD NO.:  [id number]                   PATIENT TYPE:  INP   LOCATION:  5711                                 FACILITY:  [facility]   PHYSICIAN:  Mark C. Ophelia Charter, M.D.                 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   OPERATION PERFORMED:  Right third toe amputation and VAC application.   SURGEON:  Mark C. Ophelia Charter, M.D.   ANESTHESIA:  GOT.   TOURNIQUET TIME:  Less than 30 minutes.   INDICATIONS FOR PROCEDURE:  This 60 year old male crack cocaine smoker but  not an IV drug abuse is a nondiabetic without peripheral vascular disease  and developed a right third toe infection with MRI evidence of purulence  between the third and fourth toe, purulence along the flexor tendon over the  plantar surface and also on the dorsum.   DESCRIPTION OF PROCEDURE:  After Betadine scrub Betadine solution prepping  up past the ankle, rolled towel, towel clip, extremity sheet and drapes were  applied, leg was elevated, tourniquet was inflated.  Initially, a racquet  handle incision was made saving some plantar skin for closure; however, the  skin had some purulent material underneath it. There was some inflammation  but no purulence running up the flexor tendons were put on stretch and  allowed to retract.  The tip of the toe was gangrenous.  There was some  mushy subcutaneous tissue present but no obvious pocket of purulence.  Some  of the skin over the first metatarsal had separated from the dermis and it  was resected with a scalpel blade, peeled back and removed.  The base of the  wound then had  aerobic and anaerobic cultures obtained.  Metatarsal head was  visualized.  The articular surface looked good.  Copious irrigation was  performed and probing between the metatarsal heads was performed.  There was  no obvious purulence present, continued lavage was performed and then  milking of the foot to see if there were any purulent areas more proximal.  Probing of the wound and further irrigation was performed, some debridement  of some small amount of remaining capsule and then small VAC application.  The VAC was able to seal and the patient was transferred to recovery room in  stable condition.  Mark C. Ophelia Charter, M.D.    MCY/MEDQ  D:  12/12/2003  T:  12/13/2003  Job:  [id number]

## 2011-05-21 LAB — I-STAT 8, (EC8 V) (CONVERTED LAB)
BUN: 15
Bicarbonate: 32 — ABNORMAL HIGH
Glucose, Bld: 92
pCO2, Ven: 48.6

## 2011-05-21 LAB — DIFFERENTIAL
Basophils Absolute: 0.1
Eosinophils Absolute: 0.6
Eosinophils Relative: 8 — ABNORMAL HIGH
Lymphocytes Relative: 28
Neutrophils Relative %: 51

## 2011-05-21 LAB — POCT I-STAT CREATININE: Operator id: 285491

## 2011-05-21 LAB — CBC
HCT: 44.1
Platelets: 285
RDW: 14.4 — ABNORMAL HIGH

## 2011-05-21 LAB — D-DIMER, QUANTITATIVE: D-Dimer, Quant: 0.35

## 2011-05-21 LAB — B-NATRIURETIC PEPTIDE (CONVERTED LAB): Pro B Natriuretic peptide (BNP): <30

## 2012-10-14 ENCOUNTER — Encounter: Payer: Self-pay | Admitting: Internal Medicine

## 2012-10-18 ENCOUNTER — Encounter: Payer: Self-pay | Admitting: Internal Medicine

## 2012-10-18 ENCOUNTER — Ambulatory Visit (INDEPENDENT_AMBULATORY_CARE_PROVIDER_SITE_OTHER): Payer: Medicaid Other | Admitting: Internal Medicine

## 2012-10-18 VITALS — BP 170/100 | HR 85 | Temp 97.3°F | Ht 68.0 in | Wt 181.5 lb

## 2012-10-18 DIAGNOSIS — Z8673 Personal history of transient ischemic attack (TIA), and cerebral infarction without residual deficits: Secondary | ICD-10-CM

## 2012-10-18 DIAGNOSIS — F17200 Nicotine dependence, unspecified, uncomplicated: Secondary | ICD-10-CM

## 2012-10-18 DIAGNOSIS — Z1211 Encounter for screening for malignant neoplasm of colon: Secondary | ICD-10-CM

## 2012-10-18 DIAGNOSIS — J449 Chronic obstructive pulmonary disease, unspecified: Secondary | ICD-10-CM

## 2012-10-18 DIAGNOSIS — R972 Elevated prostate specific antigen [PSA]: Secondary | ICD-10-CM

## 2012-10-18 DIAGNOSIS — I10 Essential (primary) hypertension: Secondary | ICD-10-CM

## 2012-10-18 DIAGNOSIS — Z23 Encounter for immunization: Secondary | ICD-10-CM

## 2012-10-18 DIAGNOSIS — Z888 Allergy status to other drugs, medicaments and biological substances status: Secondary | ICD-10-CM

## 2012-10-18 DIAGNOSIS — Z720 Tobacco use: Secondary | ICD-10-CM | POA: Insufficient documentation

## 2012-10-18 LAB — LIPID PANEL
Cholesterol: 182 mg/dL (ref 0–200)
HDL: 53 mg/dL (ref >39)
Total CHOL/HDL Ratio: 3.4 Ratio
Triglycerides: 146 mg/dL (ref <150)
VLDL: 29 mg/dL (ref 0–40)

## 2012-10-18 MED ORDER — HYDROCHLOROTHIAZIDE 25 MG PO TABS: 25.0000 mg | ORAL_TABLET | Freq: Every day | ORAL | Status: DC

## 2012-10-18 MED ORDER — ATORVASTATIN CALCIUM 40 MG PO TABS: 40.0000 mg | ORAL_TABLET | Freq: Every day | ORAL | Status: DC

## 2012-10-18 MED ORDER — LISINOPRIL 10 MG PO TABS: 10.0000 mg | ORAL_TABLET | Freq: Every day | ORAL | Status: DC

## 2012-10-18 MED ORDER — CLOPIDOGREL BISULFATE 75 MG PO TABS: 75.0000 mg | ORAL_TABLET | Freq: Every day | ORAL | Status: DC

## 2012-10-18 NOTE — Assessment & Plan Note (Addendum)
PSA was 9.58 in October 2008. He received no followup on this test. We will repeat a PSA today and then decide on urology referral. - PSA today - Urology referral if PSA greater than or equal to 2.5  ADDENDUM: PSA increased further to 236.2.  Referral to urology made.

## 2012-10-18 NOTE — Assessment & Plan Note (Signed)
Continue albuterol as needed. Will consider PFTs at next visit to guide management.

## 2012-10-18 NOTE — Assessment & Plan Note (Addendum)
Ischemic stroke in 2012. Will restart clopidogrel for secondary prevention since he is allergic to aspirin. We will begin lipid-lowering treatment with atorvastatin 40 mg. We are repeating a lipid panel today with baseline CMP. - Clopidogrel 75 mg daily - Atorvastatin 40 mg daily - CMP today - Lipid panel today  ADDENDUM:   CMP  Component Value Date/Time   NA 139 10/18/2012 0927   K 4.1 10/18/2012 0927   CL 104 10/18/2012 0927   CO2 28 10/18/2012 0927   GLUCOSE 88 10/18/2012 0927   BUN 8 10/18/2012 0927   CREATININE 0.99 10/18/2012 0927   CALCIUM 9.6 10/18/2012 0927   PROT 8.4* 10/18/2012 0927   ALBUMIN 4.2 10/18/2012 0927   AST 62* 10/18/2012 0927   ALT 48 10/18/2012 0927   ALKPHOS 75 10/18/2012 0927   BILITOT 0.4 10/18/2012 0927   eGFR > 89 10/18/2012 0927   Lipid Panel   Component Value Date/Time   CHOL 182 10/18/2012 0927   TRIG 146 10/18/2012 0927   HDL 53 10/18/2012 0927   LDLCALC 100* 10/18/2012 0927   10-YEAR CVD RISK (%) 23.4*

## 2012-10-18 NOTE — Patient Instructions (Addendum)
General Instructions:  Begin taking the following for medicines:  1. HYDROCHLOROTHIAZIDE 25 mg once daily (for blood pressure)  2. ATORVASTATIN 40 mg once daily (for cholesterol)  3. CLOPIDOGREL 75 mg once daily (for stroke prevention)   We will refer you for a screening colonoscopy.  We are getting lab work today.     Treatment Goals:  Goals (1 Years of Data) as of 10/18/12         As of Today As of Today 05/25/07     Blood Pressure    . Blood Pressure < 140/90  170/100 211/114 176/97      Progress Toward Treatment Goals:    Self Care Goals & Plans:  Self Care Goal 10/18/2012  Manage my medications take my medicines as prescribed  Eat healthy foods eat more vegetables; eat foods that are low in salt  Be physically active take a walk every day

## 2012-10-18 NOTE — Progress Notes (Signed)
Subjective:    Patient ID: [name], male    DOB: 07/22/1951, 62 y.o.   MRN: [id number]  HPI:  This is a 62 year old man with hypertension and a history of a ischemic stroke in 2012 who presents to establish care with [place]. He also has a history of tobacco abuse, COPD, and an elevated PSA from 2008. He has no complaints and no concerns. His only symptom is dyspnea from COPD. He still has an albuterol inhaler which he uses twice a day. He denies headaches, dizziness, blurred vision, heat or cold intolerance, fevers, chest pain, palpitations, cough, polyuria, polydipsia, dysuria, and difficulty urinating.   Review of Systems  Constitutional: Negative.  Negative for fever and chills.  HENT: Negative.  Negative for hearing loss, ear pain, congestion and sore throat.   Eyes: Negative.  Negative for visual disturbance.  Respiratory: Positive for shortness of breath. Negative for cough.   Cardiovascular: Negative.  Negative for chest pain, palpitations and leg swelling.  Gastrointestinal: Negative.  Negative for nausea, vomiting, abdominal pain, diarrhea, constipation and blood in stool.  Endocrine: Negative.  Negative for cold intolerance, heat intolerance, polydipsia and polyuria.  Genitourinary: Negative.  Negative for dysuria, hematuria and difficulty urinating.  Skin: Negative.  Negative for rash.  Neurological: Negative.  Negative for dizziness, weakness, light-headedness, numbness and headaches.    Current Medications: 1. Albuterol MDI twice a day   Allergies  Allergen Reactions  . Aspirin Hives  . Penicillins Hives    Past Medical History  Diagnosis Date  . Hypertension 05/18/2007  . COPD 05/18/2007  . Elevated PSA 05/25/2007  . 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 Liver disease Mother   . Heart attack Mother 60     Family Status  Relation Status Death Age  . Mother Deceased 8  . Father Deceased 14    accidental  . Sister Alive   . Sister Alive   . Sister Alive   . Sister Alive   . Brother Alive   . Brother Alive     Social History  . Marital Status: Single    Spouse Name: N/A    Number of Children: 0  . Years of Education: N/A   Occupational History  . Flooring instalation     Retired   Social History Main Topics  . Smoking status: Current Every Day Smoker -- 0.33 packs/day    Types: Cigarettes  . Smokeless tobacco: Never Used  . Alcohol Use: 7 - 10.5 oz/week    14-21 drink(s) per week     Comment: 2-3 beers per day  . Drug Use: No     Comment: history of cocaine use  . Sexually Active: Not Currently       Objective:   Physical Exam:  GENERAL: overweight; no acute distress HEAD: atraumatic, normocephalic EYES: pupils equal, round and reactive; sclera anicteric; normal conjunctiva EARS: external ears normal, canals patent, and TMs normal bilaterally NOSE: normal nasal mucosa, no erythema or drainage MOUTH/THROAT: oropharynx clear, moist mucous membranes, pink gingiva, poor and carious dentition NECK: supple, no carotid bruits, thyroid normal in size and without palpable nodules LYMPH: no cervical or supraclavicular lymphadenopathy LUNGS: diminished breath sounds, normal work of breathing, no rales or wheezing HEART: normal rate and regular rhythm; normal S1 and S2; no murmurs,  rubs, or clicks PULSES: radial 2+ and symmetric ABDOMEN: soft, non-tender, normal bowel sounds SKIN: warm, dry, intact, normal turgor, no rashes EXTREMITIES: no peripheral edema, clubbing, or cyanosis PSYCH: patient is alert and oriented, mood and affect are normal and congruent, thought content is normal without delusions, thought process is linear, speech is normal and non-pressured, behavior is reserved, judgement and insight are normal  Filed Vitals:   10/18/12 0902  BP: 170/100  Pulse:  85  Temp:     BP Readings from Last 3 Encounters:  10/18/12 170/100  05/25/07 176/97  05/18/07 183/99         Assessment & Plan:    Cancer Screening:  referral made for screening colonoscopy.  Vaccinations:  Influenza vaccination given today. Tdap up to date.  Pneumovax at next visit.

## 2012-10-18 NOTE — Assessment & Plan Note (Addendum)
BP Readings from Last 3 Encounters:  10/18/12 170/100  05/25/07 176/97  05/18/07 183/99    Lab Results  Component Value Date   NA 136 10/14/2010   K 4.1 10/14/2010   CREATININE 0.98 10/14/2010    Assessment:  Blood pressure control: severely elevated  Progress toward BP goal:  unable to assess  Comments: Patient has not seen a physician in several years and ran out of medicines long ago.  Plan:  Medications:  Begin treatment with hydrochlorothiazide 25 mg once daily  Educational resources provided: brochure;handout  Self management tools provided: home blood pressure logbook  Other plans: Baseline complete metabolic panel today  ADDENDUM:   CMP  Component Value Date/Time   NA 139 10/18/2012 0927   K 4.1 10/18/2012 0927   CL 104 10/18/2012 0927   CO2 28 10/18/2012 0927   GLUCOSE 88 10/18/2012 0927   BUN 8 10/18/2012 0927   CREATININE 0.99 10/18/2012 0927   CALCIUM 9.6 10/18/2012 0927   PROT 8.4* 10/18/2012 0927   ALBUMIN 4.2 10/18/2012 0927   AST 62* 10/18/2012 0927   ALT 48 10/18/2012 0927   ALKPHOS 75 10/18/2012 0927   BILITOT 0.4 10/18/2012 0927   eGFR > 89 10/18/2012 0927   Lipid Panel   Component Value Date/Time   CHOL 182 10/18/2012 0927   TRIG 146 10/18/2012 0927   HDL 53 10/18/2012 0927   LDLCALC 100* 10/18/2012 0927   10-YEAR CVD RISK (%) 23.4*

## 2012-10-18 NOTE — Assessment & Plan Note (Addendum)
Assessment:  Progress toward smoking cessation:  smoking the same amount  Barriers to progress toward smoking cessation:  withdrawal symptoms  Plan:  Instruction/counseling given:  I counseled patient on the dangers of tobacco use and advised patient to stop smoking.  Educational resources provided:  [employer]) brochure

## 2012-10-19 LAB — COMPLETE METABOLIC PANEL WITH GFR
AST: 62 U/L — ABNORMAL HIGH (ref 0–37)
Alkaline Phosphatase: 75 U/L (ref 39–117)
BUN: 8 mg/dL (ref 6–23)
Calcium: 9.6 mg/dL (ref 8.4–10.5)
Creat: 0.99 mg/dL (ref 0.50–1.35)
Total Bilirubin: 0.4 mg/dL (ref 0.3–1.2)

## 2012-10-19 LAB — PSA: PSA: 236.2 ng/mL — ABNORMAL HIGH (ref <=4.00)

## 2012-10-19 NOTE — Addendum Note (Signed)
Addended by: Gwynn Burly B on: 10/19/2012 07:49 AM   Modules accepted: Orders

## 2012-11-07 ENCOUNTER — Ambulatory Visit (INDEPENDENT_AMBULATORY_CARE_PROVIDER_SITE_OTHER): Payer: Medicaid Other | Admitting: Internal Medicine

## 2012-11-07 ENCOUNTER — Encounter: Payer: Self-pay | Admitting: Internal Medicine

## 2012-11-07 VITALS — BP 155/99 | HR 99 | Temp 97.8°F | Ht 68.0 in | Wt 177.7 lb

## 2012-11-07 DIAGNOSIS — I739 Peripheral vascular disease, unspecified: Secondary | ICD-10-CM

## 2012-11-07 DIAGNOSIS — J449 Chronic obstructive pulmonary disease, unspecified: Secondary | ICD-10-CM

## 2012-11-07 DIAGNOSIS — I10 Essential (primary) hypertension: Secondary | ICD-10-CM

## 2012-11-07 DIAGNOSIS — R972 Elevated prostate specific antigen [PSA]: Secondary | ICD-10-CM

## 2012-11-07 DIAGNOSIS — Z8673 Personal history of transient ischemic attack (TIA), and cerebral infarction without residual deficits: Secondary | ICD-10-CM

## 2012-11-07 DIAGNOSIS — J4489 Other specified chronic obstructive pulmonary disease: Secondary | ICD-10-CM

## 2012-11-07 MED ORDER — TIOTROPIUM BROMIDE MONOHYDRATE 18 MCG IN CAPS: 18.0000 ug | ORAL_CAPSULE | Freq: Every day | RESPIRATORY_TRACT | Status: DC

## 2012-11-07 MED ORDER — PRAVASTATIN SODIUM 80 MG PO TABS: 80.0000 mg | ORAL_TABLET | Freq: Every day | ORAL | Status: DC

## 2012-11-07 MED ORDER — LISINOPRIL 10 MG PO TABS: 10.0000 mg | ORAL_TABLET | Freq: Every day | ORAL | Status: DC

## 2012-11-07 MED ORDER — HYDROCHLOROTHIAZIDE 25 MG PO TABS: 25.0000 mg | ORAL_TABLET | Freq: Every day | ORAL | Status: DC

## 2012-11-07 MED ORDER — ALBUTEROL SULFATE HFA 108 (90 BASE) MCG/ACT IN AERS: 2.0000 | INHALATION_SPRAY | Freq: Four times a day (QID) | RESPIRATORY_TRACT | Status: DC | PRN

## 2012-11-07 NOTE — Assessment & Plan Note (Signed)
History and exam findings consistent with intermittent claudication. Since he does still have palpable pulses in the right foot, I don't believe this is an urgent issue. We will begin with conservative medical management. He is already on an antiplatelet (clopidogrel) and a statin (pravastatin), so no other medications are needed. I encouraged him to begin a rigorous walking exercise regimen to improve exercise tolerance in that right leg and to develop collateral circulation. If this problem worsens, we will need ABI and/or angiography in conjunction with a referral to vascular surgery.

## 2012-11-07 NOTE — Assessment & Plan Note (Signed)
Patient has an appointment with urology tomorrow. Patient provided with information on location and time of this appointment.

## 2012-11-07 NOTE — Patient Instructions (Signed)
General Instructions:  1.  Continue taking ATORVASTATIN. When you run out, fill prescription for PRAVASTATIN and take one 80 mg tablet every day.  (for cholesterol)  2.  Continue taking HYDROCHLOROTHIAZIDE for blood pressure.  3.  Start taking LISINOPRIL for blood pressure.  Take one 10 mg tablet every day.  4.  Continue taking CLOPIDOGREL for leg pain and for protection against stroke and heart attacks.  5.  Start taking TIOTROPIUM.  Inhale one capsule every day.  6.  Continue using ALBUTEROL as needed.    Treatment Goals:  Goals (1 Years of Data) as of 11/07/12         As of Today 10/18/12 10/18/12 10/15/10 05/25/07     Blood Pressure    . Blood Pressure < 140/90  155/99 170/100 211/114  176/97     Lifestyle    . Quit smoking / using tobacco  No No        Result Component    . LDL CALC < 100   100  102       ...       Progress Toward Treatment Goals:  Treatment Goal 11/07/2012  Blood pressure improved  Stop smoking smoking the same amount    Self Care Goals & Plans:  Self Care Goal 10/18/2012  Manage my medications take my medicines as prescribed  Eat healthy foods eat more vegetables; eat foods that are low in salt  Be physically active take a walk every day       Care Management & Community Referrals:  Referral 11/07/2012  Referrals made for care management support none needed

## 2012-11-07 NOTE — Assessment & Plan Note (Signed)
Because patient is having to use albuterol twice a day everyday, I think it's safe to assume this is more than mild COPD. While we workup the elevated PSA and work to bring his blood pressure under better control, I will not pursue PFTs at this time. I will empirically begin treatment with tiotropium. - Continue albuterol MDI 2 puffs every 6 hours when necessary (refilled today) - Start tiotropium 18 mcg inhaled daily (prescription and sample provided today)

## 2012-11-07 NOTE — Assessment & Plan Note (Signed)
Patient is a candidate for high intensity statin therapy, unfortunately he is unable to afford atorvastatin or rosuvastatin. I have asked him to finish his current atorvastatin and then fill pravastatin 80 mg a day. His goal for LDL based on the old recommendations is <100. His most recent LDL, while not on any statin, was 100; so I think pravastatin will do just fine and meeting LDL goals. We will have to live with moderate intensity statin therapy given his financial situation. I did, however, emphasize the importance of continued therapy with clopidogrel. - Switched to pravastatin 80 mg daily - Continue clopidogrel 75 mg daily

## 2012-11-07 NOTE — Progress Notes (Signed)
Subjective:    Patient ID: [name], male    DOB: 02/15/1951, 62 y.o.   MRN: [id number]  HPI: This is a 62 year old man with COPD, hypertension, tobacco smoke, and a history of a stroke and an elevated PSA level. He comes for followup and with a new complaint of leg cramping.  He established visit with [place] for the first time at his last visit a few weeks ago. At that visit, we began pharmacotherapy for hypertension and started clopidogrel and atorvastatin for CVD prevention. Since his last visit, he has done well. He has no complaints. He has tolerated his medicines well. He actually brought all of his medicines with him today and has been taking them as previously instructed. He is concerned about the cost. The 3 medicines cost $64 at [facility]. He denies headaches, dizziness, chest pains, fatigue, and muscle aches.  COPD We do not have PFTs on him. He uses albuterol twice a day every day. He has significant dyspnea on exertion. Cough and sputum production are not prominent symptoms.  Leg Pain Onset was 3 months ago. No provoking injury identified by patient. Quality described as cramping. Location is right sided and the hamstring musculature and calf musculature. Pain is not radiating from the buttocks. The pain is provoked by walking, particularly uphill. The pain is so severe he has to stop walking and rest for about 5 minutes for the pain to resolve. There are no other aggravating factors. There are no alleviating factors. There are no associated symptoms. He denies weakness and numbness in his feet and legs. He denies sores on his feet.    Review of Systems  Constitutional: Negative for fatigue.  Respiratory: Positive for shortness of breath. Negative for cough.   Cardiovascular: Negative for chest pain.  Musculoskeletal: Positive for myalgias. Negative for back pain, joint swelling and arthralgias.  Neurological: Negative for dizziness, weakness, numbness and headaches.    Current  Medications: 1. Albuterol MDI 2 puffs every 6 hours as needed 2. Clopidogrel 75 mg daily 3. Hydrochlorothiazide 25 mg daily 4. Atorvastatin 40 mg daily      Objective:   Physical Exam:  GENERAL: well developed, well nourished; no acute distress HEAD: atraumatic, normocephalic EYES: pupils equal, round and reactive; sclera anicteric; normal conjunctiva LUNGS: clear to auscultation bilaterally, normal work of breathing HEART: normal rate and regular rhythm; normal S1 and S2 without S3 or S4; no murmurs, rubs, or clicks PULSES: radial 2+ and symmetric; dorsalis pedis and posterior tibial pulses are 2+ on the left; dorsalis pedis and posterior tibial pulses are diminished and 1+ on the right ABDOMEN: soft, non-tender, normal bowel sounds MSK: no pain with palpation of the ankle, leg musculature, or knee; no effusion or erythema noted on the right the or ankle SKIN: warm, dry, intact, normal turgor, no rashes EXTREMITIES: no peripheral edema, clubbing, or cyanosis   Filed Vitals:   11/07/12 1327  BP: 155/99  Pulse: 99  Temp: 97.8 F (36.6 C)    BP Readings from Last 3 Encounters:  11/07/12 155/99  10/18/12 170/100  05/25/07 176/97    Lipid Panel  Component Value Date/Time   CHOL 182 10/18/2012 0927   TRIG 146 10/18/2012 0927   HDL 53 10/18/2012 0927   LDLCALC 100* 10/18/2012 0927    BMET Component Value Date/Time   NA 139 10/18/2012 0927   K 4.1 10/18/2012 0927   CL 104 10/18/2012 0927   CO2 28 10/18/2012 0927   GLUCOSE 88 10/18/2012 0927  BUN 8 10/18/2012 0927   CREATININE 0.99 10/18/2012 0927   CALCIUM 9.6 10/18/2012 0927          Assessment & Plan:

## 2012-11-07 NOTE — Assessment & Plan Note (Signed)
BP Readings from Last 3 Encounters:  11/07/12 155/99  10/18/12 170/100  05/25/07 176/97    Lab Results  Component Value Date   NA 139 10/18/2012   K 4.1 10/18/2012   CREATININE 0.99 10/18/2012    Assessment:  Blood pressure control: mildly elevated  Progress toward BP goal:  improved   Plan:  Medications: Continue hydrochlorothiazide 25 mg daily, add lisinopril 10 mg daily  Other plans: Recheck BMP at next visit

## 2012-12-05 ENCOUNTER — Emergency Department (HOSPITAL_COMMUNITY): Payer: Medicaid Other

## 2012-12-05 ENCOUNTER — Encounter (HOSPITAL_COMMUNITY): Payer: Self-pay | Admitting: Internal Medicine

## 2012-12-05 ENCOUNTER — Inpatient Hospital Stay (HOSPITAL_COMMUNITY)
Admission: EM | Admit: 2012-12-05 | Discharge: 2012-12-09 | DRG: 065 | Disposition: A | Discharge status: Rehabilitation Facility | Payer: Medicaid Other | Attending: Internal Medicine | Admitting: Internal Medicine

## 2012-12-05 DIAGNOSIS — I161 Hypertensive emergency: Secondary | ICD-10-CM

## 2012-12-05 DIAGNOSIS — R4789 Other speech disturbances: Secondary | ICD-10-CM | POA: Diagnosis present

## 2012-12-05 DIAGNOSIS — IMO0002 Reserved for concepts with insufficient information to code with codable children: Secondary | ICD-10-CM

## 2012-12-05 DIAGNOSIS — Z720 Tobacco use: Secondary | ICD-10-CM | POA: Diagnosis present

## 2012-12-05 DIAGNOSIS — E785 Hyperlipidemia, unspecified: Secondary | ICD-10-CM | POA: Diagnosis present

## 2012-12-05 DIAGNOSIS — I639 Cerebral infarction, unspecified: Secondary | ICD-10-CM

## 2012-12-05 DIAGNOSIS — R29810 Facial weakness: Secondary | ICD-10-CM | POA: Diagnosis present

## 2012-12-05 DIAGNOSIS — F1010 Alcohol abuse, uncomplicated: Secondary | ICD-10-CM | POA: Diagnosis present

## 2012-12-05 DIAGNOSIS — I10 Essential (primary) hypertension: Secondary | ICD-10-CM | POA: Diagnosis present

## 2012-12-05 DIAGNOSIS — F17200 Nicotine dependence, unspecified, uncomplicated: Secondary | ICD-10-CM | POA: Diagnosis present

## 2012-12-05 DIAGNOSIS — R972 Elevated prostate specific antigen [PSA]: Secondary | ICD-10-CM | POA: Diagnosis present

## 2012-12-05 DIAGNOSIS — H5500 Unspecified nystagmus: Secondary | ICD-10-CM | POA: Diagnosis present

## 2012-12-05 DIAGNOSIS — Z8673 Personal history of transient ischemic attack (TIA), and cerebral infarction without residual deficits: Secondary | ICD-10-CM

## 2012-12-05 DIAGNOSIS — R29898 Other symptoms and signs involving the musculoskeletal system: Secondary | ICD-10-CM | POA: Diagnosis present

## 2012-12-05 DIAGNOSIS — H02409 Unspecified ptosis of unspecified eyelid: Secondary | ICD-10-CM | POA: Diagnosis present

## 2012-12-05 DIAGNOSIS — I739 Peripheral vascular disease, unspecified: Secondary | ICD-10-CM

## 2012-12-05 DIAGNOSIS — J449 Chronic obstructive pulmonary disease, unspecified: Secondary | ICD-10-CM

## 2012-12-05 DIAGNOSIS — J4489 Other specified chronic obstructive pulmonary disease: Secondary | ICD-10-CM | POA: Diagnosis present

## 2012-12-05 DIAGNOSIS — I6340 Cerebral infarction due to embolism of unspecified cerebral artery: Principal | ICD-10-CM | POA: Diagnosis present

## 2012-12-05 DIAGNOSIS — E876 Hypokalemia: Secondary | ICD-10-CM | POA: Diagnosis present

## 2012-12-05 LAB — DIFFERENTIAL
Basophils Absolute: 0 10*3/uL (ref 0.0–0.1)
Basophils Relative: 0 % (ref 0–1)
Eosinophils Absolute: 0.2 10*3/uL (ref 0.0–0.7)
Neutro Abs: 3.2 10*3/uL (ref 1.7–7.7)
Neutrophils Relative %: 34 % — ABNORMAL LOW (ref 43–77)

## 2012-12-05 LAB — RAPID URINE DRUG SCREEN, HOSP PERFORMED
Opiates: NOT DETECTED
Tetrahydrocannabinol: NOT DETECTED

## 2012-12-05 LAB — PROTIME-INR
INR: 0.99 (ref 0.00–1.49)
Prothrombin Time: 13 seconds (ref 11.6–15.2)

## 2012-12-05 LAB — COMPREHENSIVE METABOLIC PANEL
AST: 40 U/L — ABNORMAL HIGH (ref 0–37)
Albumin: 3.2 g/dL — ABNORMAL LOW (ref 3.5–5.2)
Alkaline Phosphatase: 87 U/L (ref 39–117)
BUN: 8 mg/dL (ref 6–23)
Potassium: 3.1 mEq/L — ABNORMAL LOW (ref 3.5–5.1)
Sodium: 139 mEq/L (ref 135–145)
Total Protein: 8.3 g/dL (ref 6.0–8.3)

## 2012-12-05 LAB — URINALYSIS, ROUTINE W REFLEX MICROSCOPIC
Specific Gravity, Urine: 1.016 (ref 1.005–1.030)
Urobilinogen, UA: 0.2 mg/dL (ref 0.0–1.0)
pH: 5.5 (ref 5.0–8.0)

## 2012-12-05 LAB — CBC
MCH: 29.7 pg (ref 26.0–34.0)
MCHC: 35.6 g/dL (ref 30.0–36.0)
Platelets: 275 10*3/uL (ref 150–400)
RDW: 14 % (ref 11.5–15.5)

## 2012-12-05 LAB — POCT I-STAT, CHEM 8
BUN: 7 mg/dL (ref 6–23)
Calcium, Ion: 1.25 mmol/L (ref 1.13–1.30)
Chloride: 108 mEq/L (ref 96–112)
Creatinine, Ser: 1.2 mg/dL (ref 0.50–1.35)

## 2012-12-05 LAB — URINE MICROSCOPIC-ADD ON

## 2012-12-05 LAB — APTT: aPTT: 27 seconds (ref 24–37)

## 2012-12-05 LAB — GLUCOSE, CAPILLARY: Glucose-Capillary: 99 mg/dL (ref 70–99)

## 2012-12-05 LAB — TROPONIN I
Troponin I: <0.3 ng/mL (ref <0.30)
Troponin I: <0.3 ng/mL (ref <0.30)

## 2012-12-05 LAB — POCT I-STAT TROPONIN I: Troponin i, poc: 0.01 ng/mL (ref 0.00–0.08)

## 2012-12-05 MED ORDER — NICARDIPINE HCL IN NACL 20-0.86 MG/200ML-% IV SOLN
5.0000 mg/h | INTRAVENOUS | Status: DC
  Administered 2012-12-05: 5 mg/h via INTRAVENOUS
  Filled 2012-12-05: qty 200

## 2012-12-05 MED ORDER — LABETALOL HCL 5 MG/ML IV SOLN
10.0000 mg | Freq: Once | INTRAVENOUS | Status: AC
  Administered 2012-12-05: 10 mg via INTRAVENOUS

## 2012-12-05 MED ORDER — LABETALOL HCL 5 MG/ML IV SOLN: 5.0000 mg | INTRAVENOUS | Status: DC | PRN

## 2012-12-05 MED ORDER — LABETALOL HCL 5 MG/ML IV SOLN
INTRAVENOUS | Status: AC
  Filled 2012-12-05: qty 4

## 2012-12-05 MED ORDER — ONDANSETRON HCL 4 MG/2ML IJ SOLN
4.0000 mg | Freq: Once | INTRAMUSCULAR | Status: AC
  Administered 2012-12-05: 4 mg via INTRAVENOUS
  Filled 2012-12-05: qty 2

## 2012-12-05 MED ORDER — NICARDIPINE HCL IN NACL 20-0.86 MG/200ML-% IV SOLN: 5.0000 mg/h | INTRAVENOUS | Status: DC

## 2012-12-05 NOTE — ED Notes (Signed)
PT TRANSPORTED TO MRI.

## 2012-12-05 NOTE — ED Provider Notes (Signed)
History     CSN: [id number]  Arrival date & time 12/05/12  1845   First MD Initiated Contact with Patient 12/05/12 1851      Chief Complaint  Patient presents with  . Code Stroke  level V caveat due to urgent need for intervention.  (Consider location/radiation/quality/duration/timing/severity/associated sxs/prior treatment) The history is provided by the patient.  patient presents as a code stroke.he had acute left-sided weakness and slurred speech that began at 5:30 today. EMS was called and patient was found to be hypertensive. Patient upon arrival here states that he was feeling just generalized weakness and not on the left. He appears somewhat sedate. He was hypertensive with a systolic blood pressure of 220.  Past Medical History  Diagnosis Date  . Hypertension 05/18/2007  . COPD 05/18/2007  . Elevated PSA 05/25/2007  . 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 Stroke 12/05/2012    Past Surgical History  Procedure Laterality Date  . 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 Liver disease Mother   . Heart attack Mother 82    History  Substance Use Topics  . Smoking status: Current Every Day Smoker -- 0.33 packs/day    Types: Cigarettes  . Smokeless tobacco: Never Used  . Alcohol Use: 7 - 10.5 oz/week    14-21 drink(s) per week     Comment: 2-3 beers per day      Review of Systems  Unable to perform ROS: Acuity of condition    Allergies  Aspirin and Penicillins  Home Medications   Current Outpatient Rx  Name  Route  Sig  Dispense  Refill  . lisinopril (PRINIVIL,ZESTRIL) 10 MG tablet   Oral   Take 1 tablet (10 mg total) by mouth daily.   30 tablet   11   . pravastatin (PRAVACHOL) 80 MG tablet   Oral   Take 1 tablet (80 mg total) by mouth daily.   30 tablet   11   . albuterol (PROVENTIL HFA;VENTOLIN HFA) 108 (90 BASE) MCG/ACT inhaler   Inhalation   Inhale 2 puffs into the lungs every 6  (six) hours as needed for wheezing.   8.5 g   11   . clopidogrel (PLAVIX) 75 MG tablet   Oral   Take 1 tablet (75 mg total) by mouth daily.   30 tablet   11   . hydrochlorothiazide (HYDRODIURIL) 25 MG tablet   Oral   Take 1 tablet (25 mg total) by mouth daily.         Marland Kitchen tiotropium (SPIRIVA HANDIHALER) 18 MCG inhalation capsule   Inhalation   Place 1 capsule (18 mcg total) into inhaler and inhale daily.   30 capsule   11     BP 152/53  Pulse 80  Resp 21  SpO2 96%  Physical Exam  Constitutional: He appears well-developed and well-nourished.  HENT:  Head: Normocephalic.  Eyes: Pupils are equal, round, and reactive to light.  Neck: Normal range of motion. Neck supple.  Cardiovascular: Normal rate and regular rhythm.   Pulmonary/Chest: Effort normal and breath sounds normal.  Abdominal: Soft. Bowel sounds are normal.  Musculoskeletal: Normal range of motion.  Neurological:  Patient with equal grip strength bilaterally. Moving all extremities. He is awake and somewhat slow to answer. Complete NIH score done by neurology.  Skin: Skin is warm.    ED Course  Procedures (including critical care time)  Labs Reviewed  ETHANOL - Abnormal; Notable for the following:    Alcohol, Ethyl (B) 81 (*)    All other components within normal limits  CBC - Abnormal; Notable for the following:    HCT 37.4 (*)    All other components within normal limits  DIFFERENTIAL - Abnormal; Notable for the following:    Neutrophils Relative 34 (*)    Lymphocytes Relative 54 (*)    Lymphs Abs 5.1 (*)    All other components within normal limits  COMPREHENSIVE METABOLIC PANEL - Abnormal; Notable for the following:    Potassium 3.1 (*)    Glucose, Bld 123 (*)    Albumin 3.2 (*)    AST 40 (*)    GFR calc non Af Amer 86 (*)    All other components within normal limits  URINALYSIS, ROUTINE W REFLEX MICROSCOPIC - Abnormal; Notable for the following:    Hgb urine dipstick MODERATE (*)    All  other components within normal limits  URINE MICROSCOPIC-ADD ON - Abnormal; Notable for the following:    Casts HYALINE CASTS (*)    All other components within normal limits  POCT I-STAT, CHEM 8 - Abnormal; Notable for the following:    Potassium 3.1 (*)    Glucose, Bld 126 (*)    All other components within normal limits  PROTIME-INR  APTT  TROPONIN I  GLUCOSE, CAPILLARY  URINE RAPID DRUG SCREEN (HOSP PERFORMED)  TROPONIN I  HEMOGLOBIN A1C  POCT I-STAT TROPONIN I   Ct Head Wo Contrast  12/05/2012  *RADIOLOGY REPORT*  Clinical Data: Stroke.  Left-sided weakness.  Slurred speech. Abnormal gait.  CT HEAD WITHOUT CONTRAST  Technique:  Contiguous axial images were obtained from the base of the skull through the vertex without contrast.  Comparison: MRI 10/14/2010.  CT 10/14/2010.  Findings: No mass lesion, mass effect, midline shift, hydrocephalus, hemorrhage.  No acute territorial cortical ischemia/infarct. Atrophy and chronic ischemic white matter disease is present.  Intracranial atherosclerosis is present.  Gray-white differentiation appears preserved.  There is a new tiny lacunar infarct adjacent to the frontal horn of the right lateral ventricle compared to prior MRI and CT.  Complete opacification of the left maxillary sinus is chronic.  There is also left maxillary mucoperiosteal thickening.  The other paranasal sinuses appear within normal limits.  Mastoid air cells within normal limits. High right frontal subcutaneous scalp lesion is present measuring 14 mm, nonspecific.  IMPRESSION: Atrophy and chronic ischemic white matter disease without acute intracranial abnormality.  Chronic left paranasal sinus disease.  Critical Value/emergent results were called by telephone at the time of interpretation on 12/05/2012 at 1905 hours to Dr. Leroy Kennedy who verbally acknowledged these results.   Original Report Authenticated By: Andreas Newport, M.D.    Mr [facility] Wo Contrast  12/05/2012  *RADIOLOGY  REPORT*  Clinical Data:  Left-sided weakness.  MRI HEAD WITHOUT CONTRAST MRA HEAD WITHOUT CONTRAST  Technique:  Multiplanar, multiecho pulse sequences of the brain and surrounding structures were obtained without intravenous contrast. Angiographic images of the head were obtained using MRA technique without contrast.  Comparison:   None  MRI HEAD  Findings:  There is swelling and restricted diffusion in the region of the genu of the corpus callosum more towards the right.  This probably represents an acute/subacute infarction.  The differential diagnosis does include neoplastic disease, but that is felt less likely. There is also probably an acute infarction at the inferior cerebellum on the right.  I  have some concern that this could be artifactual, but there is loss of flow in the right vertebral artery since the previous study, further evidence of this is probably an acute infarction.  There is an old inferior cerebellar infarction on the right.  Elsewhere, the patient has chronic small vessel disease throughout the white matter and an old cortical and subcortical infarction at the right parietal vertex.  No evidence of hemorrhage, hydrocephalus or extra-axial collection.  IMPRESSION: Acute or subacute abnormality at the genu of the corpus callosum more to the right of midline.  This is most consistent with infarction.  The differential diagnosis does include tumor, but that is felt considerably less likely.  Probable acute infarction at the inferior cerebellum on the right. Loss of flow in the right vertebral artery since the previous study.  Extensive chronic small vessel disease elsewhere throughout the brain.  Old cortical and subcortical infarction at the vertex on the right.  MRA HEAD  Findings: Both internal carotid arteries are patent into the brain. There is atherosclerotic irregularity in the siphon region with narrowing, worse on the left than the right.  The anterior and middle cerebral vessels show  flow, but there are multiple stenoses that have worsened considerably since the previous study.  The left vertebral artery remains patent to the basilar.  The right vertebral artery is now occluded except for a small amount of retrograde flow distally.  No basilar stenosis.  Superior cerebellar and posterior cerebral arteries continue to show flow.  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 Only a small amount of retrograde flow in the distal part of that vessel.  The left vertebral artery remains widely patent to the basilar.  Marked progression of atherosclerotic disease throughout the anterior and middle cerebral branches bilaterally, worse on the left than the right.   Original Report Authenticated By: Paulina Fusi, M.D.    [name]  12/05/2012  *RADIOLOGY REPORT*  Clinical Data:  Left-sided weakness.  MRI HEAD WITHOUT CONTRAST MRA HEAD WITHOUT CONTRAST  Technique:  Multiplanar, multiecho pulse sequences of the brain and surrounding structures were obtained without intravenous contrast. Angiographic images of the head were obtained using MRA technique without contrast.  Comparison:   None  MRI HEAD  Findings:  There is swelling and restricted diffusion in the region of the genu of the corpus callosum more towards the right.  This probably represents an acute/subacute infarction.  The differential diagnosis does include neoplastic disease, but that is felt less likely. There is also probably an acute infarction at the inferior cerebellum on the right.  I have some concern that this could be artifactual, but there is loss of flow in the right vertebral artery since the previous study, further evidence of this is probably an acute infarction.  There is an old inferior cerebellar infarction on the right.  Elsewhere, the patient has chronic small vessel disease throughout the white matter and an old cortical and subcortical infarction at the right parietal vertex.  No  evidence of hemorrhage, hydrocephalus or extra-axial collection.  IMPRESSION: Acute or subacute abnormality at the genu of the corpus callosum more to the right of midline.  This is most consistent with infarction.  The differential diagnosis does include tumor, but that is felt considerably less likely.  Probable acute infarction at the inferior cerebellum on the right. Loss of flow in the right vertebral artery since the previous study.  Extensive chronic small vessel disease elsewhere throughout the  brain.  Old cortical and subcortical infarction at the vertex on the right.  MRA HEAD  Findings: Both internal carotid arteries are patent into the brain. There is atherosclerotic irregularity in the siphon region with narrowing, worse on the left than the right.  The anterior and middle cerebral vessels show flow, but there are multiple stenoses that have worsened considerably since the previous study.  The left vertebral artery remains patent to the basilar.  The right vertebral artery is now occluded except for a small amount of retrograde flow distally.  No basilar stenosis.  Superior cerebellar and posterior cerebral arteries continue to show flow.  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 Only a small amount of retrograde flow in the distal part of that vessel.  The left vertebral artery remains widely patent to the basilar.  Marked progression of atherosclerotic disease throughout the anterior and middle cerebral branches bilaterally, worse on the left than the right.   Original Report Authenticated By: Paulina Fusi, M.D.    Dg Chest Port 1 View  12/05/2012  *RADIOLOGY REPORT*  Clinical Data: Altered mental status.  Hypertension.  PORTABLE CHEST - 1 VIEW  Comparison: 12/05/2009.  Findings: Monitoring leads are projected over the chest. Cardiopericardial silhouette within normal limits.  No focal consolidation is identified.  Tortuous thoracic aorta.  Lung volumes are lower  than on prior exam.  No effusion.  IMPRESSION: Suboptimal inspiration.  No acute cardiopulmonary disease.   Original Report Authenticated By: Andreas Newport, M.D.      1. Malignant hypertension   2. Cerebral embolism with cerebral infarction     Date: 12/05/2012  Rate: 77  Rhythm: normal sinus rhythm  QRS Axis: normal  Intervals: PR prolonged  ST/T Wave abnormalities: nonspecific ST/T changes  Conduction Disutrbances:none  Narrative Interpretation:   Old EKG Reviewed: unchanged     MDM  Patient presents as a code stroke. Symptoms have improved. CT was negative but MRI showed some lesions. Seen in the ED by neurology. Patient required IV antihypertensives. Neurology requests medicine admission. He'll be admitted to his primary care doctors.        Juliet Rude. Rubin Payor, MD 12/05/12 [phone]

## 2012-12-05 NOTE — ED Notes (Signed)
PT RETURNED FROM MRI. DR. Thad Ranger AT BEDSIDE

## 2012-12-05 NOTE — Code Documentation (Addendum)
62 year old patient presents to ED via [facility] as code stroke.  Roommate stated to EMS that patient was fine - they were sitting there talking - when patient suddenly started to have slurred speech and left side weakness.  EMS called the stroke from the field at 1841.  Patient arrived at 46.  EDP exam at 1847.  Stroke team arrived at 11.  Patient to CT scanner at 1850.  LSW 1730.  EMS reports HTN in field - 200./114.  On arrival to ED sx began to resolve.  NIHSS post CT scan was 01 for lethargy.  BP 178/108.  Arouses easily but very sleepy.  Patient states he took his prescription meds today -denies any other drug use today.  Patient states he fell 3 times today because he was dizzy.  Only c/o now is headache.   Dr. Cyril Mourning present.   Dr. Thad Ranger present.  To MRI for DWI to r/o acute event.  Handoff report to [place] RRT RN and ED RN [place].

## 2012-12-05 NOTE — ED Notes (Signed)
Patient arrived via [facility] with witnessed onset of stroke symptoms that started at 1730 today. Patient started having slurred speech and left sided weakness. Facial drooping started during transport.

## 2012-12-05 NOTE — H&P (Signed)
Hospital Admission Note Date: 12/05/2012  Patient name: [name] Medical record number: [id number] Date of birth: December 01, 1950 Age: 62 y.o. Gender: male PCP: Lollie Sails, MD  Medical Service: Internal Medicine Attending physician: Dr. Kem Kays     1st Contact: Dr. Heloise Beecham [phone] 2nd Contact: Dr. Manson Passey [phone] After 5 pm or weekends: 1st Contact:  Pager: [phone] 2nd Contact: Pager: [phone]  Chief Complaint: Dizziness, headache  History of Present Illness: Around 5:30 PM patient had sudden onset of witnessed slurred speech accompanied by 9/10 generalized headache, left sided weakness (with left weak hand grip) and unsteady gait. En route patient begun to lean towards right side per ED documentation.  The patient admits to dizziness (now improved) which brought him into the ED associated with generalized 9/10 headache.   Meds: Current Outpatient Rx  Name  Route  Sig  Dispense  Refill  . lisinopril (PRINIVIL,ZESTRIL) 10 MG tablet   Oral   Take 1 tablet (10 mg total) by mouth daily.   30 tablet   11   . pravastatin (PRAVACHOL) 80 MG tablet   Oral   Take 1 tablet (80 mg total) by mouth daily.   30 tablet   11   . albuterol (PROVENTIL HFA;VENTOLIN HFA) 108 (90 BASE) MCG/ACT inhaler   Inhalation   Inhale 2 puffs into the lungs every 6 (six) hours as needed for wheezing.   8.5 g   11   . clopidogrel (PLAVIX) 75 MG tablet   Oral   Take 1 tablet (75 mg total) by mouth daily.   30 tablet   11   . hydrochlorothiazide (HYDRODIURIL) 25 MG tablet   Oral   Take 1 tablet (25 mg total) by mouth daily.         Marland Kitchen tiotropium (SPIRIVA HANDIHALER) 18 MCG inhalation capsule   Inhalation   Place 1 capsule (18 mcg total) into inhaler and inhale daily.   30 capsule   11     Allergies: Allergies as of 12/05/2012 - Review Complete 11/07/2012  Allergen Reaction Noted  . Aspirin Hives 10/18/2012  . Penicillins Hives 10/18/2012   Past Medical History  Diagnosis  Date  . Hypertension 05/18/2007  . COPD 05/18/2007  . Elevated PSA 05/25/2007  . 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 Stroke 12/05/2012   Past Surgical History  Procedure Laterality Date  . 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 Liver disease Mother   . Heart attack Mother 8   History   Social History  . Marital Status: Single    Spouse Name: N/A    Number of Children: 0  . Years of Education: N/A   Occupational History  . Flooring instalation     Retired   Social History Main Topics  . Smoking status: Current Every Day Smoker -- 0.33 packs/day    Types: Cigarettes  . Smokeless tobacco: Never Used  . Alcohol Use: 7 - 10.5 oz/week    14-21 drink(s) per week     Comment: 2-3 beers per day  . Drug Use: No     Comment: history of cocaine use  . Sexually Active: Not Currently   Other Topics Concern  . Not on file   Social History Narrative   Drinks 1/2 pt of wine daily and beer daily (at least 12 oz)   Denies recreational drugs    Smokes 3-4 cigarettes. Smoking x 50 years  Lives alone no kids, unemployed previously worked as a Music therapist.    2 years of college at [employer] per patient           Review of Systems: General: denies fever/chills HEENT: +h/a (generalized 9/10), denies blurry vision Cardiac: denies chest pain Pulm: denies sob  Abd/GU: denies abdominal pain, constipation, +recent hematuria resolved, denies dysuria/blood in stool  Ext: denies lower extremity swelling  Neuro: +h/a, +left arm weakness, +slurred speech, +facial droop, +dizziness (improved)   Physical Exam: VS 80/23/172/83 (116)  Blood pressure 175/97, pulse 77, resp. rate 19, SpO2 91.00%. Vitals reviewed. General: resting in bed, NAD, alert and oriented x 3  HEENT: PERRL b/l, normal scleral, dry oral mucosa  Cardiac: RRR, no rubs, murmurs or gallops Pulm: ctab Abd: soft, nontender, nondistended, BS present,  obese Ext: warm and well perfused, no pedal edema, distal pulse intact Neuro: alert and oriented X3, +right facial droop, +slurred speech, otherwise neurologically intact, 5/5 strength all 4 extremities, intact sensation face and upper and lower extremities b/l, heel shin intact, rapid alternating movements intact  Lab results: Basic Metabolic Panel:  Recent Labs  96/29/52 1851 12/05/12 1855  NA 139 143  K 3.1* 3.1*  CL 105 108  CO2 21  --   GLUCOSE 123* 126*  BUN 8 7  CREATININE 0.96 1.20  CALCIUM 9.7  --    Liver Function Tests:  Recent Labs  12/05/12 1851  AST 40*  ALT 36  ALKPHOS 87  BILITOT 0.3  PROT 8.3  ALBUMIN 3.2*   CBC:  Recent Labs  12/05/12 1851 12/05/12 1855  WBC 9.4  --   NEUTROABS 3.2  --   HGB 13.3 13.9  HCT 37.4* 41.0  MCV 83.5  --   PLT 275  --    Cardiac Enzymes:  Recent Labs  12/05/12 1851 12/05/12 2128  TROPONINI <0.30 <0.30   CBG:  Recent Labs  12/05/12 2040  GLUCAP 99   Hemoglobin A1C: No results found for this basename: HGBA1C,  in the last 72 hours Fasting Lipid Panel: No results found for this basename: CHOL, HDL, LDLCALC, TRIG, CHOLHDL, LDLDIRECT,  in the last 72 hours  Coagulation:  Recent Labs  12/05/12 1851  LABPROT 13.0  INR 0.99   Urine Drug Screen: Drugs of Abuse     Component Value Date/Time   LABOPIA NONE DETECTED 12/05/2012 2207   LABOPIA NEGATIVE 10/14/2010 2140   COCAINSCRNUR NONE DETECTED 12/05/2012 2207   COCAINSCRNUR  Value: POSITIVE (NOTE) Result repeated and verified. Sent for confirmatory testing* 10/14/2010 2140   LABBENZ NONE DETECTED 12/05/2012 2207   LABBENZ NEGATIVE 10/14/2010 2140   AMPHETMU NONE DETECTED 12/05/2012 2207   AMPHETMU NEGATIVE 10/14/2010 2140   THCU NONE DETECTED 12/05/2012 2207   LABBARB NONE DETECTED 12/05/2012 2207    Alcohol Level:  Recent Labs  12/05/12 1851  ETH 81*   Urinalysis:  Recent Labs  12/05/12 2207  COLORURINE YELLOW  LABSPEC 1.016  PHURINE 5.5   GLUCOSEU NEGATIVE  HGBUR MODERATE*  BILIRUBINUR NEGATIVE  KETONESUR NEGATIVE  PROTEINUR NEGATIVE  UROBILINOGEN 0.2  NITRITE NEGATIVE  LEUKOCYTESUR NEGATIVE   Misc. Labs: UDS Mag UA TSH   Imaging results:  Ct Head Wo Contrast  12/05/2012  *RADIOLOGY REPORT*  Clinical Data: Stroke.  Left-sided weakness.  Slurred speech. Abnormal gait.  CT HEAD WITHOUT CONTRAST  Technique:  Contiguous axial images were obtained from the base of the skull through the vertex without contrast.  Comparison: MRI 10/14/2010.  CT 10/14/2010.  Findings: No mass lesion, mass effect, midline shift, hydrocephalus, hemorrhage.  No acute territorial cortical ischemia/infarct. Atrophy and chronic ischemic white matter disease is present.  Intracranial atherosclerosis is present.  Gray-white differentiation appears preserved.  There is a new tiny lacunar infarct adjacent to the frontal horn of the right lateral ventricle compared to prior MRI and CT.  Complete opacification of the left maxillary sinus is chronic.  There is also left maxillary mucoperiosteal thickening.  The other paranasal sinuses appear within normal limits.  Mastoid air cells within normal limits. High right frontal subcutaneous scalp lesion is present measuring 14 mm, nonspecific.  IMPRESSION: Atrophy and chronic ischemic white matter disease without acute intracranial abnormality.  Chronic left paranasal sinus disease.  Critical Value/emergent results were called by telephone at the time of interpretation on 12/05/2012 at 1905 hours to Dr. Leroy Kennedy who verbally acknowledged these results.   Original Report Authenticated By: Andreas Newport, M.D.    Mr [facility] Wo Contrast  12/05/2012  *RADIOLOGY REPORT*  Clinical Data:  Left-sided weakness.  MRI HEAD WITHOUT CONTRAST MRA HEAD WITHOUT CONTRAST  Technique:  Multiplanar, multiecho pulse sequences of the brain and surrounding structures were obtained without intravenous contrast. Angiographic images of the head  were obtained using MRA technique without contrast.  Comparison:   None  MRI HEAD  Findings:  There is swelling and restricted diffusion in the region of the genu of the corpus callosum more towards the right.  This probably represents an acute/subacute infarction.  The differential diagnosis does include neoplastic disease, but that is felt less likely. There is also probably an acute infarction at the inferior cerebellum on the right.  I have some concern that this could be artifactual, but there is loss of flow in the right vertebral artery since the previous study, further evidence of this is probably an acute infarction.  There is an old inferior cerebellar infarction on the right.  Elsewhere, the patient has chronic small vessel disease throughout the white matter and an old cortical and subcortical infarction at the right parietal vertex.  No evidence of hemorrhage, hydrocephalus or extra-axial collection.  IMPRESSION: Acute or subacute abnormality at the genu of the corpus callosum more to the right of midline.  This is most consistent with infarction.  The differential diagnosis does include tumor, but that is felt considerably less likely.  Probable acute infarction at the inferior cerebellum on the right. Loss of flow in the right vertebral artery since the previous study.  Extensive chronic small vessel disease elsewhere throughout the brain.  Old cortical and subcortical infarction at the vertex on the right.  MRA HEAD  Findings: Both internal carotid arteries are patent into the brain. There is atherosclerotic irregularity in the siphon region with narrowing, worse on the left than the right.  The anterior and middle cerebral vessels show flow, but there are multiple stenoses that have worsened considerably since the previous study.  The left vertebral artery remains patent to the basilar.  The right vertebral artery is now occluded except for a small amount of retrograde flow distally.  No basilar  stenosis.  Superior cerebellar and posterior cerebral arteries continue to show flow.  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 Only a small amount of retrograde flow in the distal part of that vessel.  The left vertebral artery remains widely patent to the basilar.  Marked progression of atherosclerotic disease throughout the anterior and middle cerebral branches bilaterally, worse on the left than  the right.   Original Report Authenticated By: Paulina Fusi, M.D.    [name]  12/05/2012  *RADIOLOGY REPORT*  Clinical Data:  Left-sided weakness.  MRI HEAD WITHOUT CONTRAST MRA HEAD WITHOUT CONTRAST  Technique:  Multiplanar, multiecho pulse sequences of the brain and surrounding structures were obtained without intravenous contrast. Angiographic images of the head were obtained using MRA technique without contrast.  Comparison:   None  MRI HEAD  Findings:  There is swelling and restricted diffusion in the region of the genu of the corpus callosum more towards the right.  This probably represents an acute/subacute infarction.  The differential diagnosis does include neoplastic disease, but that is felt less likely. There is also probably an acute infarction at the inferior cerebellum on the right.  I have some concern that this could be artifactual, but there is loss of flow in the right vertebral artery since the previous study, further evidence of this is probably an acute infarction.  There is an old inferior cerebellar infarction on the right.  Elsewhere, the patient has chronic small vessel disease throughout the white matter and an old cortical and subcortical infarction at the right parietal vertex.  No evidence of hemorrhage, hydrocephalus or extra-axial collection.  IMPRESSION: Acute or subacute abnormality at the genu of the corpus callosum more to the right of midline.  This is most consistent with infarction.  The differential diagnosis does include tumor,  but that is felt considerably less likely.  Probable acute infarction at the inferior cerebellum on the right. Loss of flow in the right vertebral artery since the previous study.  Extensive chronic small vessel disease elsewhere throughout the brain.  Old cortical and subcortical infarction at the vertex on the right.  MRA HEAD  Findings: Both internal carotid arteries are patent into the brain. There is atherosclerotic irregularity in the siphon region with narrowing, worse on the left than the right.  The anterior and middle cerebral vessels show flow, but there are multiple stenoses that have worsened considerably since the previous study.  The left vertebral artery remains patent to the basilar.  The right vertebral artery is now occluded except for a small amount of retrograde flow distally.  No basilar stenosis.  Superior cerebellar and posterior cerebral arteries continue to show flow.  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 Only a small amount of retrograde flow in the distal part of that vessel.  The left vertebral artery remains widely patent to the basilar.  Marked progression of atherosclerotic disease throughout the anterior and middle cerebral branches bilaterally, worse on the left than the right.   Original Report Authenticated By: Paulina Fusi, M.D.    Dg Chest Port 1 View  12/05/2012  *RADIOLOGY REPORT*  Clinical Data: Altered mental status.  Hypertension.  PORTABLE CHEST - 1 VIEW  Comparison: 12/05/2009.  Findings: Monitoring leads are projected over the chest. Cardiopericardial silhouette within normal limits.  No focal consolidation is identified.  Tortuous thoracic aorta.  Lung volumes are lower than on prior exam.  No effusion.  IMPRESSION: Suboptimal inspiration.  No acute cardiopulmonary disease.   Original Report Authenticated By: Andreas Newport, M.D.     Other results: EKG: 77 NSR LAD, LVH, nl QRS, prolonged PR, prolonged QT, T wave inversions  in V5 V6 avR, V1, no ST changes, Q waves V1, avr  Assessment & Plan by Problem: 62 y.o PMH HTN, dyslipidemia, tobacco abuse (all risk factors for stroke) presents with hypertensive emergency  and acute or subacute abnormality at the right genu of the corpus callosum and right inferior cerebellum and atherosclerosis noted.   1. 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 Patient already prescribed Plavix 75 mg qd but not taking -Neuro checks  -trend cardiac enzymes (negative x 2)  2. HTN emergency  -Evidenced by acute stroke.  BP 209/99 on admission.  Trended up this admission.  Given Labetalol 10 mg x 2 iv and started on Nicardipine gtt (per Dr. Thad Ranger Neurology); per neurology goal sbp 180s-190s but discontinued Nicardipine gtt per Critical Care Dr. Herma Carson -will add prn Labetalol 5 mg q2 hours sbp >200 -Monitor VS -Hold HCTZ 25 mg qd, Lisiniopril 10 mg qd. Patient states he was only taking Lisinopril at home out of HCTZ   3. COPD -prn Albuterol, Spiriva qd   4. Dyslipidemia  Lipid Panel     Component Value Date/Time   CHOL 182 10/18/2012 0927   TRIG 146 10/18/2012 0927   HDL 53 10/18/2012 0927   CHOLHDL 3.4 10/18/2012 0927   VLDL 29 10/18/2012 0927   LDLCALC 100* 10/18/2012 0927   Previously taking Pravachol 80 mg qhs. Ordered Lipitor 80 mg qhs   5. Tobacco abuse and alcohol abuse  -Smoking cessation  -EtOH 81.  -social work consulted   6. Recent elevated PSA (236.20 10/2012) and hematuria (per patient recently) -patient states hematuria resolved  -patient had a procedure with [facility] 12/01/12- will need to get records in am   7.  F/E/N -Hypokalemia 3.1 on admission-given K 10 meq x 5  -will monitor and replace electrolytes prn  -NPO pending RN swallow and SLP evaluation   8. DVT Px  -Heparin sq    Dispo: Disposition is deferred at this time, awaiting improvement of current medical problems. Anticipated discharge in approximately 2-5 day(s).   The patient does have a current PCP Earlene Plater, Dorthula Rue, MD), therefore will be requiring [facility]PC follow-up after discharge.   Unknown if the patient has transportation limitations that hinder transportation to clinic appointments.  SignedAnnett Gula [phone] 12/05/2012, 11:32 PM

## 2012-12-05 NOTE — Consult Note (Signed)
Referring Physician: Dr. Rubin Payor    Chief Complaint: left sided weakness  HPI: [name] is an 62 y.o. male who was seen by his family to have acute left sided weakness with slurred speech at 1730 today, 12/05/2012. EMS arrived, SBP~220. Patient complained of concurrent headache. He does have hypertension and did take his medications today. En route EMS states that his symptoms were resolving although his blood pressure remained high. He does remain sleepy but easy to awake.  Date last known well: 12/05/2012 Time last known well: 0730 NIHSS=1 (groggy, yet easy to awake)  tPA Given: No: not at this time, symptoms resolved.   Past Medical History  Diagnosis Date  . Hypertension 05/18/2007  . COPD 05/18/2007  . Elevated PSA 05/25/2007  . 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 Liver disease Mother   . Heart attack Mother 25   Social History:  reports that he has been smoking Cigarettes.  He has been smoking about 0.33 packs per day. He has never used smokeless tobacco. He reports that he drinks about 7.0 ounces of alcohol per week. He reports that he does not use illicit drugs.  Allergies:  Allergies  Allergen Reactions  . Aspirin Hives  . Penicillins Hives    No current facility-administered medications for this encounter.   Current Outpatient Prescriptions  Medication Sig Dispense Refill  . albuterol (PROVENTIL HFA;VENTOLIN HFA) 108 (90 BASE) MCG/ACT inhaler Inhale 2 puffs into the lungs every 6 (six) hours as needed for wheezing.  8.5 g  11  . clopidogrel (PLAVIX) 75 MG tablet Take 1 tablet (75 mg total) by mouth daily.  30 tablet  11  . hydrochlorothiazide (HYDRODIURIL) 25 MG tablet Take 1 tablet (25 mg total) by mouth daily.      Marland Kitchen lisinopril (PRINIVIL,ZESTRIL) 10 MG tablet Take 1 tablet (10 mg total) by mouth  daily.  30 tablet  11  . pravastatin (PRAVACHOL) 80 MG tablet Take 1 tablet (80 mg total) by mouth daily.  30 tablet  11  . tiotropium (SPIRIVA HANDIHALER) 18 MCG inhalation capsule Place 1 capsule (18 mcg total) into inhaler and inhale daily.  30 capsule  11   ROS: History obtained from the patient and EMS  General ROS: negative for - chills, fatigue, fever, night sweats, weight gain or weight loss Psychological ROS: negative for - behavioral disorder, hallucinations, memory difficulties, mood swings or suicidal ideation Ophthalmic ROS: negative for - blurry vision, double vision, eye pain or loss of vision ENT ROS: negative for - epistaxis, nasal discharge, oral lesions, sore throat, tinnitus or vertigo +++ headache Allergy and Immunology ROS: negative for - hives or itchy/watery eyes Hematological and Lymphatic ROS: negative for - bleeding problems, bruising or swollen lymph nodes Endocrine ROS: negative for - galactorrhea, hair pattern changes, polydipsia/polyuria or temperature intolerance Respiratory ROS: negative for - cough, hemoptysis, shortness of breath or wheezing Cardiovascular ROS: negative for - chest pain, dyspnea on exertion, edema or irregular heartbeat Gastrointestinal ROS: negative for - abdominal pain, diarrhea, hematemesis, nausea/vomiting or stool incontinence Genito-Urinary ROS: negative for - dysuria, hematuria, incontinence or urinary frequency/urgency Musculoskeletal ROS: negative for - joint swelling or muscular weakness, +++left arm weakness Neurological ROS: as noted in HPI Dermatological ROS: negative for rash and skin lesion changes   Physical Examination: Blood  pressure 196/92, pulse 75, resp. rate 18, SpO2 100.00%.  Neurologic Examination: Mental Status: Groggy but easy to awake. Oriented, thought content appropriate.  Speech fluent without evidence of aphasia.  Able to follow 3 step commands without difficulty. Cranial Nerves: II: visual fields grossly  normal, pupils equal, round III,IV, VI: ptosis not present, extra-ocular motions intact bilaterally V,VII: smile symmetric, facial light touch sensation normal bilaterally VIII: hearing normal bilaterally Motor: Right : Upper extremity   5/5    Left:     Upper extremity   5/5  Lower extremity   5/5     Lower extremity   5/5 Tone and bulk:normal tone throughout; no atrophy noted Sensory: Pinprick and light touch intact throughout, bilaterally Plantars: Right: downgoing   Left: downgoing Cerebellar: normal finger-to-nose. Gait not tested due to patient safety.   Results for orders placed during the hospital encounter of 12/05/12 (from the past 48 hour(s))  CBC     Status: Abnormal   Collection Time    12/05/12  6:51 PM      Result Value Range   WBC 9.4  4.0 - 10.5 K/uL   RBC 4.48  4.22 - 5.81 MIL/uL   Hemoglobin 13.3  13.0 - 17.0 g/dL   HCT 16.1 (*) 09.6 - 04.5 %   MCV 83.5  78.0 - 100.0 fL   MCH 29.7  26.0 - 34.0 pg   MCHC 35.6  30.0 - 36.0 g/dL   RDW 40.9  81.1 - 91.4 %   Platelets 275  150 - 400 K/uL  DIFFERENTIAL     Status: Abnormal   Collection Time    12/05/12  6:51 PM      Result Value Range   Neutrophils Relative 34 (*) 43 - 77 %   Neutro Abs 3.2  1.7 - 7.7 K/uL   Lymphocytes Relative 54 (*) 12 - 46 %   Lymphs Abs 5.1 (*) 0.7 - 4.0 K/uL   Monocytes Relative 9  3 - 12 %   Monocytes Absolute 0.9  0.1 - 1.0 K/uL   Eosinophils Relative 2  0 - 5 %   Eosinophils Absolute 0.2  0.0 - 0.7 K/uL   Basophils Relative 0  0 - 1 %   Basophils Absolute 0.0  0.0 - 0.1 K/uL  POCT I-STAT, CHEM 8     Status: Abnormal   Collection Time    12/05/12  6:55 PM      Result Value Range   Sodium 143  135 - 145 mEq/L   Potassium 3.1 (*) 3.5 - 5.1 mEq/L   Chloride 108  96 - 112 mEq/L   BUN 7  6 - 23 mg/dL   Creatinine, Ser 7.82  0.50 - 1.35 mg/dL   Glucose, Bld 956 (*) 70 - 99 mg/dL   Calcium, Ion 2.13  0.86 - 1.30 mmol/L   TCO2 23  0 - 100 mmol/L   Hemoglobin 13.9  13.0 - 17.0 g/dL    HCT 57.8  46.9 - 62.9 %  POCT I-STAT TROPONIN I     Status: None   Collection Time    12/05/12  7:00 PM      Result Value Range   Troponin i, poc 0.01  0.00 - 0.08 ng/mL   Comment 3            Comment: Due to the release kinetics of cTnI,     a negative result within the first hours     of the onset of  symptoms does not rule out     myocardial infarction with certainty.     If myocardial infarction is still suspected,     repeat the test at appropriate intervals.   Ct Head Wo Contrast  12/05/2012  *RADIOLOGY REPORT*  Clinical Data: Stroke.  Left-sided weakness.  Slurred speech. Abnormal gait.  CT HEAD WITHOUT CONTRAST  Technique:  Contiguous axial images were obtained from the base of the skull through the vertex without contrast.  Comparison: MRI 10/14/2010.  CT 10/14/2010.  Findings: No mass lesion, mass effect, midline shift, hydrocephalus, hemorrhage.  No acute territorial cortical ischemia/infarct. Atrophy and chronic ischemic white matter disease is present.  Intracranial atherosclerosis is present.  Gray-white differentiation appears preserved.  There is a new tiny lacunar infarct adjacent to the frontal horn of the right lateral ventricle compared to prior MRI and CT.  Complete opacification of the left maxillary sinus is chronic.  There is also left maxillary mucoperiosteal thickening.  The other paranasal sinuses appear within normal limits.  Mastoid air cells within normal limits. High right frontal subcutaneous scalp lesion is present measuring 14 mm, nonspecific.  IMPRESSION: Atrophy and chronic ischemic white matter disease without acute intracranial abnormality.  Chronic left paranasal sinus disease.  Critical Value/emergent results were called by telephone at the time of interpretation on 12/05/2012 at 1905 hours to Dr. Leroy Kennedy who verbally acknowledged these results.   Original Report Authenticated By: Andreas Newport, M.D.     Assessment: 62 y.o. male with transient left sided  weakness and slurred speech. Now only with grogginess but easy to awake. CT was non-acute; therefore, will proceed with MR Brain imaging. Proceed with stroke workup.  Stroke Risk Factors - hyperlipidemia and hypertension  Plan: 1. HgbA1c, fasting lipid panel 2. MRI, MRA  of the brain without contrast ( I have ordered)  3. PT consult, OT consult, Speech consult 4. Echocardiogram 5. Carotid dopplers 6. Prophylactic therapy-Antiplatelet med: patient cannot take aspirin, consider plavix. 7. Risk factor modification 8. Telemetry monitoring 9. Frequent neuro checks  Job Founds, MBA, MHA [facility] Pager [phone]  12/05/2012, 7:18 PM  Patient seen and examined together with physician assistant and I concur with the assessment and plan.  Wyatt Portela, MD

## 2012-12-05 NOTE — ED Notes (Signed)
CARDENE DRIP PAUSED FOR APPROX 30 MIN DUE TO PT BP DROPPING BELOW THERAPEUTIC LEVEL OF 180-190 SYSTOLIC (PER DR. Thad Ranger)

## 2012-12-05 NOTE — ED Notes (Addendum)
ORDER PER DR. Thad Ranger TO KEEP PT FLAT

## 2012-12-05 NOTE — Consult Note (Addendum)
Addendum:  Review of MRI of the brain has been reviewed and shows right cerebellar and right corpus callossal acute infarcts.  MRA has been reviewed as well and is significant for a nonvisualized right vertebral.   Repeat examination shows mild right ptosis, right nystagmus, ?mild right facial droop, slurred speech (felt to be baseline), normal symmetric strength and no evidence of dysmetria.  Plantars downgoing.   BP on return from MRI is 238/122.  20mg  of Labetolol given with repeat BP of 211/112.  Heart rate responding.  Patient ordered to start a Cardene drip.  SBP to be maintained at 180-190.   This patient is critically ill and at significant risk of neurological worsening, death and care requires constant monitoring of vital signs, hemodynamics,respiratory and cardiac monitoring, neurological assessment, discussion with family, other specialists and medical decision making of high complexity. I spent 60 minutes of neurocritical care time  in the care of  this patient.   Case discussed with Dr. Demetrius Charity, MD [facility] [phone]  12/05/2012, 9:04PM

## 2012-12-05 NOTE — ED Notes (Signed)
Per EMS pt had sudden onset of slurred speech witnessed by roomates at 1730, accompanied by headache and left sided weakness evidenced by left weak hand grip and unsteady gait. Pt alert and oriented. En route pt begun to lean towards right side.

## 2012-12-06 ENCOUNTER — Inpatient Hospital Stay (HOSPITAL_COMMUNITY): Payer: Medicaid Other

## 2012-12-06 ENCOUNTER — Encounter (HOSPITAL_COMMUNITY): Payer: Self-pay | Admitting: *Deleted

## 2012-12-06 DIAGNOSIS — F1010 Alcohol abuse, uncomplicated: Secondary | ICD-10-CM

## 2012-12-06 DIAGNOSIS — I6340 Cerebral infarction due to embolism of unspecified cerebral artery: Principal | ICD-10-CM

## 2012-12-06 DIAGNOSIS — I639 Cerebral infarction, unspecified: Secondary | ICD-10-CM

## 2012-12-06 DIAGNOSIS — E876 Hypokalemia: Secondary | ICD-10-CM

## 2012-12-06 DIAGNOSIS — I739 Peripheral vascular disease, unspecified: Secondary | ICD-10-CM

## 2012-12-06 DIAGNOSIS — I10 Essential (primary) hypertension: Secondary | ICD-10-CM

## 2012-12-06 DIAGNOSIS — J449 Chronic obstructive pulmonary disease, unspecified: Secondary | ICD-10-CM

## 2012-12-06 DIAGNOSIS — I161 Hypertensive emergency: Secondary | ICD-10-CM

## 2012-12-06 DIAGNOSIS — E785 Hyperlipidemia, unspecified: Secondary | ICD-10-CM

## 2012-12-06 DIAGNOSIS — I6350 Cerebral infarction due to unspecified occlusion or stenosis of unspecified cerebral artery: Secondary | ICD-10-CM

## 2012-12-06 LAB — CBC WITH DIFFERENTIAL/PLATELET
Eosinophils Absolute: 0 10*3/uL (ref 0.0–0.7)
Eosinophils Relative: 0 % (ref 0–5)
HCT: 39.1 % (ref 39.0–52.0)
Hemoglobin: 14.1 g/dL (ref 13.0–17.0)
Lymphocytes Relative: 24 % (ref 12–46)
Lymphs Abs: 2.5 10*3/uL (ref 0.7–4.0)
MCH: 30.3 pg (ref 26.0–34.0)
MCV: 84.1 fL (ref 78.0–100.0)
Monocytes Absolute: 0.8 10*3/uL (ref 0.1–1.0)
Monocytes Relative: 8 % (ref 3–12)
RBC: 4.65 MIL/uL (ref 4.22–5.81)
WBC: 10.4 10*3/uL (ref 4.0–10.5)

## 2012-12-06 LAB — BASIC METABOLIC PANEL
BUN: 8 mg/dL (ref 6–23)
CO2: 24 mEq/L (ref 19–32)
CO2: 24 mEq/L (ref 19–32)
Calcium: 9.7 mg/dL (ref 8.4–10.5)
Calcium: 9.9 mg/dL (ref 8.4–10.5)
Chloride: 101 mEq/L (ref 96–112)
GFR calc non Af Amer: >90 mL/min (ref >90)
Glucose, Bld: 143 mg/dL — ABNORMAL HIGH (ref 70–99)
Glucose, Bld: 105 mg/dL — ABNORMAL HIGH (ref 70–99)
Potassium: 3.8 mEq/L (ref 3.5–5.1)
Sodium: 138 mEq/L (ref 135–145)
Sodium: 134 mEq/L — ABNORMAL LOW (ref 135–145)

## 2012-12-06 LAB — HEMOGLOBIN A1C
Hgb A1c MFr Bld: 5.5 % (ref <5.7)
Mean Plasma Glucose: 111 mg/dL (ref <117)

## 2012-12-06 LAB — LIPID PANEL
Cholesterol: 146 mg/dL (ref 0–200)
LDL Cholesterol: 78 mg/dL (ref 0–99)
Total CHOL/HDL Ratio: 3.1 RATIO
VLDL: 21 mg/dL (ref 0–40)

## 2012-12-06 LAB — TSH: TSH: 1.109 u[IU]/mL (ref 0.350–4.500)

## 2012-12-06 LAB — TROPONIN I: Troponin I: <0.3 ng/mL (ref <0.30)

## 2012-12-06 LAB — MRSA PCR SCREENING: MRSA by PCR: NEGATIVE

## 2012-12-06 MED ORDER — SODIUM CHLORIDE 0.9 % IJ SOLN: 3.0000 mL | Freq: Two times a day (BID) | INTRAMUSCULAR | Status: DC

## 2012-12-06 MED ORDER — CHLORHEXIDINE GLUCONATE 0.12 % MT SOLN
OROMUCOSAL | Status: AC
  Administered 2012-12-06: 15 mL
  Filled 2012-12-06: qty 15

## 2012-12-06 MED ORDER — BIOTENE DRY MOUTH MT LIQD
15.0000 mL | Freq: Two times a day (BID) | OROMUCOSAL | Status: DC
  Administered 2012-12-07 – 2012-12-09 (×6): 15 mL via OROMUCOSAL

## 2012-12-06 MED ORDER — LISINOPRIL 10 MG PO TABS
10.0000 mg | ORAL_TABLET | Freq: Every day | ORAL | Status: DC
  Administered 2012-12-07 – 2012-12-08 (×2): 10 mg via ORAL
  Filled 2012-12-06 (×4): qty 1

## 2012-12-06 MED ORDER — HYDROCHLOROTHIAZIDE 25 MG PO TABS
25.0000 mg | ORAL_TABLET | Freq: Every day | ORAL | Status: DC
  Administered 2012-12-07 – 2012-12-08 (×2): 25 mg via ORAL
  Filled 2012-12-06 (×4): qty 1

## 2012-12-06 MED ORDER — ONDANSETRON HCL 4 MG/2ML IJ SOLN: 4.0000 mg | Freq: Three times a day (TID) | INTRAMUSCULAR | Status: DC | PRN

## 2012-12-06 MED ORDER — TIOTROPIUM BROMIDE MONOHYDRATE 18 MCG IN CAPS
18.0000 ug | ORAL_CAPSULE | Freq: Every day | RESPIRATORY_TRACT | Status: DC
  Filled 2012-12-06 (×3): qty 5

## 2012-12-06 MED ORDER — CLOPIDOGREL BISULFATE 75 MG PO TABS
75.0000 mg | ORAL_TABLET | Freq: Every day | ORAL | Status: DC
  Administered 2012-12-06 – 2012-12-08 (×3): 75 mg via ORAL
  Filled 2012-12-06 (×4): qty 1

## 2012-12-06 MED ORDER — ATORVASTATIN CALCIUM 80 MG PO TABS
80.0000 mg | ORAL_TABLET | Freq: Every day | ORAL | Status: DC
  Administered 2012-12-06 – 2012-12-08 (×3): 80 mg via ORAL
  Filled 2012-12-06 (×4): qty 1

## 2012-12-06 MED ORDER — NICARDIPINE HCL IN NACL 20-0.86 MG/200ML-% IV SOLN
5.0000 mg/h | INTRAVENOUS | Status: DC
  Administered 2012-12-06: 5 mg/h via INTRAVENOUS
  Administered 2012-12-06: 3 mg/h via INTRAVENOUS
  Filled 2012-12-06 (×3): qty 200

## 2012-12-06 MED ORDER — CHLORHEXIDINE GLUCONATE 0.12 % MT SOLN
15.0000 mL | Freq: Two times a day (BID) | OROMUCOSAL | Status: DC
  Administered 2012-12-07 – 2012-12-09 (×5): 15 mL via OROMUCOSAL
  Filled 2012-12-06 (×7): qty 15

## 2012-12-06 MED ORDER — POTASSIUM CHLORIDE 10 MEQ/100ML IV SOLN
10.0000 meq | Freq: Once | INTRAVENOUS | Status: AC
  Administered 2012-12-06: 10 meq via INTRAVENOUS

## 2012-12-06 MED ORDER — NICARDIPINE HCL IN NACL 20-0.86 MG/200ML-% IV SOLN: 5.0000 mg/h | INTRAVENOUS | Status: DC

## 2012-12-06 MED ORDER — SODIUM CHLORIDE 0.9 % IV SOLN
250.0000 mL | INTRAVENOUS | Status: DC | PRN
  Administered 2012-12-07: 03:00:00 via INTRAVENOUS

## 2012-12-06 MED ORDER — SODIUM CHLORIDE 0.9 % IJ SOLN: 3.0000 mL | INTRAMUSCULAR | Status: DC | PRN

## 2012-12-06 MED ORDER — ALBUTEROL SULFATE HFA 108 (90 BASE) MCG/ACT IN AERS: 2.0000 | INHALATION_SPRAY | Freq: Four times a day (QID) | RESPIRATORY_TRACT | Status: DC | PRN

## 2012-12-06 MED ORDER — ENOXAPARIN SODIUM 40 MG/0.4ML ~~LOC~~ SOLN
40.0000 mg | SUBCUTANEOUS | Status: DC
  Administered 2012-12-06 – 2012-12-09 (×4): 40 mg via SUBCUTANEOUS
  Filled 2012-12-06 (×5): qty 0.4

## 2012-12-06 MED ORDER — SODIUM CHLORIDE 0.9 % IJ SOLN
3.0000 mL | Freq: Two times a day (BID) | INTRAMUSCULAR | Status: DC
  Administered 2012-12-06: 3 mL via INTRAVENOUS
  Administered 2012-12-06: 22:00:00 via INTRAVENOUS
  Administered 2012-12-06 – 2012-12-09 (×6): 3 mL via INTRAVENOUS

## 2012-12-06 MED ORDER — PNEUMOCOCCAL VAC POLYVALENT 25 MCG/0.5ML IJ INJ
0.5000 mL | INJECTION | INTRAMUSCULAR | Status: AC
  Administered 2012-12-07: 0.5 mL via INTRAMUSCULAR
  Filled 2012-12-06: qty 0.5

## 2012-12-06 MED ORDER — PANTOPRAZOLE SODIUM 40 MG IV SOLR
40.0000 mg | INTRAVENOUS | Status: DC
  Administered 2012-12-06 – 2012-12-09 (×4): 40 mg via INTRAVENOUS
  Filled 2012-12-06 (×5): qty 40

## 2012-12-06 MED ORDER — POTASSIUM CHLORIDE 10 MEQ/100ML IV SOLN
10.0000 meq | INTRAVENOUS | Status: AC
  Administered 2012-12-06 (×4): 10 meq via INTRAVENOUS
  Filled 2012-12-06 (×6): qty 100

## 2012-12-06 MED ORDER — HEPARIN SODIUM (PORCINE) 5000 UNIT/ML IJ SOLN
5000.0000 [IU] | Freq: Three times a day (TID) | INTRAMUSCULAR | Status: DC
  Administered 2012-12-06: 5000 [IU] via SUBCUTANEOUS
  Filled 2012-12-06 (×4): qty 1

## 2012-12-06 NOTE — Evaluation (Signed)
Clinical/Bedside Swallow Evaluation Patient Details  Name: [name] MRN: [id number] Date of Birth: 1950/10/29  Today's Date: 12/06/2012 Time: 4098-1191 SLP Time Calculation (min): 16 min  Past Medical History:  Past Medical History  Diagnosis Date  . Hypertension 05/18/2007  . COPD 05/18/2007  . Elevated PSA 05/25/2007  . 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 Stroke 12/05/2012   Past Surgical History:  Past Surgical History  Procedure Laterality Date  . 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    Assessment / Plan / Recommendation Clinical Impression  Patient presents with bedside s/s of a moderate-severe oropharyngeal dysphagia characterized by decreased pharyngeal secretions management as evidenced by wet vocal quality. Po trials elicited multiple swallows per bolus, wet vocal quality, and throat clearing suggestive of pharyngeal residuals in which patient unable to fully clear despite max clinician cues for hard throat clear and dry swallow. Objective evaluation will be beneficial in determining extent of dysphagia and potential to resume a po diet. Will proceed with MBS at 1030 this am.     Aspiration Risk  Moderate    Diet Recommendation NPO   Medication Administration: Via alternative means    Other  Recommendations Recommended Consults: MBS Oral Care Recommendations: Oral care QID   Follow Up Recommendations   (TBD pending MBS)       Pertinent Vitals/Pain None reported        Swallow Study    General HPI: 62 year old male admitted with 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  Type of Study: Bedside swallow evaluation Previous Swallow Assessment: none reported Diet Prior to this Study: NPO Temperature Spikes Noted: No Respiratory Status: Room air History of Recent Intubation: No Behavior/Cognition: Alert;Cooperative;Pleasant  mood Self-Feeding Abilities: Able to feed self Patient Positioning: Upright in bed Baseline Vocal Quality: Wet;Low vocal intensity Volitional Cough: Strong Volitional Swallow: Able to elicit    Oral/Motor/Sensory Function Overall Oral Motor/Sensory Function: Impaired Labial ROM: Reduced right Labial Symmetry: Abnormal symmetry right Labial Strength: Reduced Labial Sensation: Within Functional Limits Lingual ROM: Within Functional Limits Lingual Symmetry: Within Functional Limits Lingual Strength: Within Functional Limits Lingual Sensation: Within Functional Limits Facial ROM: Reduced right Facial Symmetry: Right droop Facial Strength: Reduced Facial Sensation: Within Functional Limits Velum:  (difficult to assess due to size of tongue) Mandible: Within Functional Limits   Ice Chips Ice chips: Impaired Presentation: Spoon Oral Phase Impairments: Reduced labial seal Oral Phase Functional Implications: Right anterior spillage Pharyngeal Phase Impairments: Suspected delayed Swallow;Decreased hyoid-laryngeal movement;Wet Vocal Quality;Throat Clearing - Delayed (multiple swallows)   Thin Liquid Thin Liquid: Impaired Presentation: Cup;Self Fed Oral Phase Impairments: Reduced labial seal Oral Phase Functional Implications: Right anterior spillage Pharyngeal  Phase Impairments: Multiple swallows;Suspected delayed Swallow;Decreased hyoid-laryngeal movement;Wet Vocal Quality;Throat Clearing - Immediate    Nectar Thick Nectar Thick Liquid: Not tested   Honey Thick Honey Thick Liquid: Not tested   Puree Puree: Not tested   Solid   GO   Terry Ladouceur MA, CCC-SLP [phone]  Solid: Not tested       [name] 12/06/2012,9:05 AM

## 2012-12-06 NOTE — H&P (Signed)
PULMONARY  / CRITICAL CARE MEDICINE  Name: [name] MRN: [id number] DOB: 1950/09/16    ADMISSION DATE:  2012/12/06 CONSULTATION DATE:  December 06, 2012  REFERRING MD :  EDP PRIMARY SERVICE: PCCM  CHIEF COMPLAINT:  Generalized weakness   BRIEF PATIENT DESCRIPTION: 62 yo with h/o CVA admitted with acute right cerebellar and right corpus callosum infarcts and hypertensive emergency.  SIGNIFICANT EVENTS / STUDIES:  12/07/2022  Head CT  >>> nad 12/07/22  Brain MRI >>> acute right cerebellar and right corpus callosum infarcts  LINES / TUBES:  CULTURES:  ANTIBIOTICS:  HISTORY OF PRESENT ILLNESS:  62 yo with h/o CVA admitted with acute right cerebellar and right corpus callosum infarcts and hypertensive emergency.  PAST MEDICAL HISTORY :  Past Medical History  Diagnosis Date  . Hypertension 05/18/2007  . COPD 05/18/2007  . Elevated PSA 05/25/2007  . 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 Stroke 2012-12-06   Past Surgical History  Procedure Laterality Date  . 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 Authorizing Provider  lisinopril (PRINIVIL,ZESTRIL) 10 MG tablet Take 1 tablet (10 mg total) by mouth daily. 11/07/12 11/07/13 Yes Lollie Sails, MD  pravastatin (PRAVACHOL) 80 MG tablet Take 1 tablet (80 mg total) by mouth daily. 11/07/12  Yes Lollie Sails, MD  albuterol (PROVENTIL HFA;VENTOLIN HFA) 108 (90 BASE) MCG/ACT inhaler Inhale 2 puffs into the lungs every 6 (six) hours as needed for wheezing. 11/07/12   Lollie Sails, MD  clopidogrel (PLAVIX) 75 MG tablet Take 1 tablet (75 mg total) by mouth daily. 10/18/12 10/18/13  Lollie Sails, MD  hydrochlorothiazide (HYDRODIURIL) 25 MG tablet Take 1 tablet (25 mg total) by mouth daily. 11/07/12   Lollie Sails, MD  tiotropium (SPIRIVA HANDIHALER) 18 MCG inhalation capsule Place 1 capsule (18 mcg total) into inhaler and inhale daily. 11/07/12 11/07/13  Lollie Sails, MD   Allergies  Allergen Reactions  . Aspirin Hives  . Penicillins Hives   FAMILY HISTORY:  Family History  Problem Relation Age of Onset  . Liver disease Mother   . Heart attack Mother 82   SOCIAL HISTORY:  reports that he has been smoking Cigarettes.  He has been smoking about 0.33 packs per day. He has never used smokeless tobacco. He reports that he drinks about 7.0 ounces of alcohol per week. He reports that he does not use illicit drugs.  REVIEW OF SYSTEMS:   Gen: Denies fever, chills, decreased appetite HEENT: denies headache or blurry vision Cardiac: denies chest pain Pulmonary: denies shortness of breath Abd/GU: denies abdominal pain, diarrhea, constipation, or melena. Recent hematuria that resolved, one episode of nonbloody emesis with food particles  MSK: denies LE edema or weakness Neuro: slurred speech and facial droop, no dizziness   VITAL SIGNS: Pulse Rate:  [73-81] 77 12/07/22 2315) Resp:  [16-24] 19 2022/12/07 2315) BP: (152-232)/(53-134) 175/97 mmHg December 07, 2022 2315) SpO2:  [91 %-100 %] 91 % 2022-12-07 2315)  HEMODYNAMICS:   VENTILATOR SETTINGS:   INTAKE / OUTPUT: Intake/Output   None     PHYSICAL EXAMINATION: General:  Lying in bed in NAD Neuro:  Alert and Oriented x3, normal ARM, no tongue deviation, slurred speech, nl finger-to-nose test, nl heel to shin test, no pronator drip HEENT:  PERRL, mild right ptosis, right facial droop Cardiovascular:  S1, s2, nl rate Lungs:  CTA, no wheezing Abdomen:  Soft,  nontender, nondistended, normoactive BS Musculoskeletal:  strenght 5/5 in UE and LE, hand grip 5/5 bilaterally Skin:  Dry skin in LE  LABS:  Recent Labs Lab 12/05/12 1851 12/05/12 1855 12/05/12 2128  HGB 13.3 13.9  --   WBC 9.4  --   --   PLT 275  --   --   NA 139 143  --   K 3.1* 3.1*  --   CL 105 108  --   CO2 21  --   --   GLUCOSE 123* 126*  --   BUN 8 7  --   CREATININE 0.96 1.20  --   CALCIUM 9.7  --   --   AST 40*  --   --   ALT  36  --   --   ALKPHOS 87  --   --   BILITOT 0.3  --   --   PROT 8.3  --   --   ALBUMIN 3.2*  --   --   APTT 27  --   --   INR 0.99  --   --   TROPONINI <0.30  --  <0.30    Recent Labs Lab 12/05/12 2040  GLUCAP 99   CXR: 4/28 >>> nad  ASSESSMENT / PLAN:  PULMONARY A: COPD, no evidence of exacerbation. P:   Albuterol inhaler q6h PRN Spiriva  CARDIOVASCULAR A: Hypertensive emergency.  Dyslipidemia. P:  Nicardipine drip titration with goal of SBP of 180-190 Continue Lisinopril, HCTZ  RENAL A:  Hypokalemia. P:   Trend BMET Replete K 10 x 5  GASTROINTESTINAL A:  Emesis - resolved P:   Zofran PRN  HEMATOLOGIC A:  No active issues. P:  Trend CBC  INFECTIOUS A:  No active issues. P:   No intervention required  ENDOCRINE A:  Hyperglycemia. P:   SSI  NEUROLOGIC A: Acute right cerebellar and right corpus callossal infarcts - likely ischemic: cardioembolic v atherosclerosis. Presented outside tPA window, Neurology following.  P:   Per Neurology Plavix  TODAY'S SUMMARY:  62 yo with h/o CVA admitted with acute right cerebellar and right corpus callosum infarcts and hypertensive emergency.  Tonight:  Nicardipine gtt, replace K.  In AM:  CVA workup per Neurology.  I have personally obtained a history, examined the patient, evaluated laboratory and imaging results, formulated the assessment and plan and placed orders.  Lonia Farber, MD Pulmonary and Critical Care Medicine [facility] Pager: [phone]  12/06/2012, 12:34 AM

## 2012-12-06 NOTE — H&P (Signed)
PULMONARY  / CRITICAL CARE MEDICINE  Name: [name] MRN: [id number] DOB: 26-Dec-1950    ADMISSION DATE:  12/05/2012 CONSULTATION DATE:  12/05/2012  REFERRING MD :  EDP PRIMARY SERVICE: PCCM  CHIEF COMPLAINT:  Generalized weakness   BRIEF PATIENT DESCRIPTION: 62 yo with h/o CVA admitted with acute right cerebellar and right corpus callosum infarcts and hypertensive emergency.  SIGNIFICANT EVENTS / STUDIES:  4/28  Head CT  >>>Atrophy and chronic ischemic white matter disease without acute<BR>intracranial abnormality. Chronic left paranasal sinus disease 4/28  Brain MRI >>> acute right cerebellar and right corpus callosum infarcts  LINES / TUBES:  CULTURES:  ANTIBIOTICS:  Overnight: Remains HTn on nicardipine  VITAL SIGNS: Temp:  [97.5 F (36.4 C)-97.6 F (36.4 C)] 97.5 F (36.4 C) (04/29 0700) Pulse Rate:  [65-83] 71 (04/29 1115) Resp:  [16-26] 21 (04/29 1015) BP: (142-232)/(53-134) 168/96 mmHg (04/29 1115) SpO2:  [91 %-100 %] 98 % (04/29 1115) Weight:  [78.2 kg (172 lb 6.4 oz)] 78.2 kg (172 lb 6.4 oz) (04/29 0145)  HEMODYNAMICS:   VENTILATOR SETTINGS:   INTAKE / OUTPUT: Intake/Output     04/28 0701 - 04/29 0700 04/29 0701 - 04/30 0700   I.V. (mL/kg) 189.2 (2.4) 90 (1.2)   Other 20 80   IV Piggyback 400 100   Total Intake(mL/kg) 609.2 (7.8) 270 (3.5)   Urine (mL/kg/hr) 300 900 (2.7)   Total Output 300 900   Net +309.2 -630          PHYSICAL EXAMINATION: General:  Lying in bed in NAD Neuro:  Alert and Oriented x3, normal ARM, no tongue deviation, slurred speech,  HEENT:  PERRL, mild right ptosis, right facial droop Cardiovascular:  S1, s2, nl rate Lungs:  CTA Abdomen:  Soft, nontender, nondistended, normoactive BS Musculoskeletal:  strenght 5/5 in UE and LE, hand grip 5/5 bilaterally Skin:  Dry skin in LE  LABS:  Recent Labs Lab 12/05/12 1851 12/05/12 1855 12/05/12 2128 12/06/12 0229 12/06/12 0820  HGB 13.3 13.9  --   --  14.1  WBC 9.4  --    --   --  10.4  PLT 275  --   --   --  279  NA 139 143  --  138 134*  K 3.1* 3.1*  --  3.8 4.1  CL 105 108  --  103 101  CO2 21  --   --  24 24  GLUCOSE 123* 126*  --  143* 105*  BUN 8 7  --  8 8  CREATININE 0.96 1.20  --  0.71 0.69  CALCIUM 9.7  --   --  9.7 9.9  MG  --   --   --  1.7  --   PHOS  --   --   --  3.2  --   AST 40*  --   --   --   --   ALT 36  --   --   --   --   ALKPHOS 87  --   --   --   --   BILITOT 0.3  --   --   --   --   PROT 8.3  --   --   --   --   ALBUMIN 3.2*  --   --   --   --   APTT 27  --   --   --   --   INR 0.99  --   --   --   --  TROPONINI <0.30  --  <0.30 <0.30 <0.30    Recent Labs Lab 12/05/12 2040  GLUCAP 99   CXR: 4/28 >>> hint fluid rt fissue  ASSESSMENT / PLAN:  PULMONARY A: COPD, no evidence of exacerbation. P:   Albuterol inhaler q6h PRN Spiriva  CARDIOVASCULAR A: Hypertensive emergency.  Dyslipidemia., no evidence ischemia P:  Nicardipine drip titration with goal of SBP of 180-190, permissive x 2-3 days Once able to swallow, will load oral meds to dc drip Continue Lisinopril, HCTZ when orals allow  RENAL A:  Hypokalemia. P:   Trend BMETin am  Assess k in am  Ok with even balance  GASTROINTESTINAL A:  Emesis - resolved P:   Zofran PRN For objective slp With CVA , needs ppi, to oral when able  HEMATOLOGIC A:  dvt prevention P:  lovenox ( in setting cva)  INFECTIOUS A:  No active issues. P:   No intervention required  ENDOCRINE A:  Hyperglycemia. P:   SSI  NEUROLOGIC A: Acute right cerebellar and right corpus callossal infarcts - likely ischemic: cardioembolic v atherosclerosis. Presented outside tPA window, Neurology following.  P:   Per Neurology Plavix when swallow, may need ngt Asas allergy Pt/ot  TODAY'S SUMMARY:  Slp, nicardpine to goals, when orals, will load oral HTN meds, pt, ot  I have personally obtained a history, examined the patient, evaluated laboratory and imaging results,  formulated the assessment and plan and placed orders.  Ccm time 30 min , HTN crisis, vaso actives  Nelda Bucks., MD Pulmonary and Critical Care Medicine [facility] Pager: [phone]  12/06/2012, 11:20 AM

## 2012-12-06 NOTE — Progress Notes (Addendum)
*  PRELIMINARY RESULTS* Vascular Ultrasound Carotid Duplex (Doppler) has been completed.   There is no obvious evidence of hemodynamically significant internal carotid artery stenosis bilaterally. Vertebral arteries are patent with antegrade flow.    Vascular Ultrasound Transcranial Doppler has been completed.    12/06/2012 2:29 PM Gertie Fey, RDMS, RDCS

## 2012-12-06 NOTE — Evaluation (Signed)
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 Ot to follow acutely. Recommend SNF for d/c planning. Pt with poor caregiver support for d/c    OT Assessment  Patient needs continued OT Services    Follow Up Recommendations  SNF    Barriers to Discharge Decreased caregiver support    Equipment Recommendations  3 in 1 bedside comode    Recommendations for Other Services    Frequency  Min 3X/week    Precautions / Restrictions Precautions Precautions: Fall   Pertinent Vitals/Pain BP  170's / 80's during session    ADL  Eating/Feeding: Set up Where Assessed - Eating/Feeding: Chair Grooming: Teeth care;Set up Where Assessed - Grooming: Supported sitting Toilet Transfer: Moderate assistance Toilet Transfer Method: Sit to stand Toilet Transfer Equipment: Raised toilet seat with arms (or 3-in-1 over toilet) Equipment Used: Gait belt;Rolling walker Transfers/Ambulation Related to ADLs: Pt with strong lean to the right side during all mobility and static standing. pt needs cues to reposition to neutral static standing. Pt is unable to sustain position at this time ADL Comments: Pt supien on arrival and states that he is hungry. Pt with excellent recall of therapist AManda name and last seen location as Radiology. Pt reports that friend Simona Huh could provide support upon d/c home however patient lives at boarding house and question assistance available. Pt currently requires (A) for all mobility total +2 for safety. pt provided oral kit . Pt demonstrates facial numbness and weakness. Pt able to pucker lips and stick out tongue. Pt however with  assymetrical smile and slurred speech    OT Diagnosis: Generalized weakness;Disturbance of vision;Ataxia  OT Problem List: Decreased strength;Decreased activity tolerance;Impaired balance (sitting and/or standing);Impaired vision/perception;Decreased knowledge of precautions;Decreased knowledge of use of DME or AE OT Treatment Interventions: Self-care/ADL training;Therapeutic exercise;Neuromuscular education;DME and/or AE instruction;Therapeutic activities;Visual/perceptual remediation/compensation;Patient/family education;Balance training   OT Goals Acute Rehab OT Goals OT Goal Formulation: With patient Time For Goal Achievement: 12/20/12 Potential to Achieve Goals: Good ADL Goals Pt Will Perform Grooming: with set-up;Sitting, chair;Supported ADL Goal: Grooming - Progress: Goal set today Pt Will Perform Upper Body Bathing: with set-up;Sitting, chair;Supported ADL Goal: Upper Body Bathing - Progress: Goal set today Pt Will Perform Upper Body Dressing: with set-up;Sitting, chair;Supported ADL Goal: Patent attorney - Progress: Goal set today Pt Will Transfer to Toilet: with mod assist;Ambulation;3-in-1 ADL Goal: Toilet Transfer - Progress: Goal set today Pt Will Perform Toileting - Clothing Manipulation: with mod assist;Sitting on 3-in-1 or toilet ADL Goal: Toileting - Clothing Manipulation - Progress: Goal set today Pt Will Perform Toileting - Hygiene: with mod assist;Sit to stand from 3-in-1/toilet ADL Goal: Toileting - Hygiene - Progress: Goal set today  Visit Information  Last OT Received On: 12/06/12 Assistance Needed: +2 PT/OT Co-Evaluation/Treatment: Yes    Subjective Data  Subjective: "I have COPD "  "she said it didnt go to good but I am hungry" Patient Stated Goal: " I dont have a goal"   Prior Functioning     Home Living Lives With: Alone Type of Home: Other (Comment) (boarding house) Home Access: Level entry Home Layout: One level Bathroom Shower/Tub: Walk-in  shower;Door [employer] Toilet: Standard Home Adaptive Equipment:  None Prior Function Level of Independence: Independent Able to Take Stairs?: No Driving: No Vocation: Unemployed Comments: take the bus or walk Communication Communication: Expressive difficulties (slurred speech) Dominant Hand: Right         Vision/Perception Vision - History Baseline Vision: Wears glasses only for reading Patient Visual Report:  (nystagmus noted) Vision - Assessment Eye Alignment: Impaired (comment) Vision Assessment: Vision tested Ocular Range of Motion: Within Functional Limits Alignment/Gaze Preference: Head turned Tracking/Visual Pursuits: Decreased smoothness of eye movement to RIGHT superior field;Decreased smoothness of eye movement to RIGHT inferior field;Requires cues, head turns, or add eye shifts to track Convergence: Impaired (comment) Additional Comments: Pt demonstrates nystagmus with visual pursuits to the right side superior and inferior fields. Pt with some nystagmus noted to the left however Rt visual field much more present. Pt reports no changes in vision   Cognition  Cognition Arousal/Alertness: Awake/alert Behavior During Therapy: WFL for tasks assessed/performed Overall Cognitive Status: Within Functional Limits for tasks assessed    Extremity/Trunk Assessment Right Upper Extremity Assessment RUE ROM/Strength/Tone: Within functional levels RUE Sensation: WFL - Light Touch RUE Coordination: WFL - gross/fine motor Left Upper Extremity Assessment LUE ROM/Strength/Tone: Within functional levels LUE Sensation: WFL - Light Touch LUE Coordination: WFL - gross/fine motor Trunk Assessment Trunk Assessment: Normal     Mobility Bed Mobility Bed Mobility: Supine to Sit;Sitting - Scoot to Edge of Bed;Sit to Supine Supine to Sit: 4: Min guard;HOB flat Sitting - Scoot to [place] of Bed: 4: Min guard Details for Bed Mobility Assistance: Pt verbalizes I need help I am going to  fall. pt with LOB to the Rt with scoot to EOB and needed (A) to regain balance. Pt needed BIL UE to maintain upright posture Transfers Transfers: Sit to Stand;Stand to Sit Sit to Stand: 3: Mod assist;With upper extremity assist;From chair/3-in-1 Stand to Sit: 3: Mod assist;With upper extremity assist;To chair/3-in-1 Details for Transfer Assistance: pt needed v/c for hand placment and to control descend to chair. pt with strong right lean with all mobility. pt with incr speed and incr right lean.     Exercise     Balance Balance Balance Assessed: Yes Static Sitting Balance Static Sitting - Balance Support: Bilateral upper extremity supported;Feet supported Static Sitting - Level of Assistance: 4: Min assist;5: Stand by assistance Static Standing Balance Static Standing - Balance Support: Bilateral upper extremity supported;During functional activity Static Standing - Level of Assistance: 1: +2 Total assist (pt 50%)   End of Session OT - End of Session Equipment Utilized During Treatment: Gait belt Activity Tolerance: Patient tolerated treatment well Patient left: in chair;with call bell/phone within reach Nurse Communication: Mobility status;Precautions  GO     Lucile Shutters 12/06/2012, 3:22 PM Pager: [phone]

## 2012-12-06 NOTE — Evaluation (Signed)
Physical Therapy Evaluation Patient Details Name: [name] MRN: [id number] DOB: [birth year] Today's Date: 12/06/2012 Time: 4098-1191 PT Time Calculation (min): 28 min  PT Assessment / Plan / Recommendation Clinical Impression  Patient is a 62 y/o male admitted with right cerebellar and right corpus callosum CVA presenting with ataxia and severe balance deficit as well as poor body spacial awareness, decreased vision with positive right nystagmus and poor gaze stability without visual or dizziness complaint and oral motor dysphagia with poor secretion control.  He will benefit from skilled PT in the acute setting to maximize independence and allow return to independent following prolonged rehab stay likely  SNF level rehab needed.     PT Assessment  Patient needs continued PT services    Follow Up Recommendations  SNF    Does the patient have the potential to tolerate intense rehabilitation    yes  Barriers to Discharge Decreased caregiver support      Equipment Recommendations  Other (comment) (TBA next venue)    Recommendations for Other Services   None  Frequency Min 4X/week    Precautions / Restrictions Precautions Precautions: Fall   Pertinent Vitals/Pain No pain complaints      Mobility  Bed Mobility Bed Mobility: Supine to Sit;Sitting - Scoot to Edge of Bed;Sit to Supine Supine to Sit: 4: Min guard;HOB flat Sitting - Scoot to [place] of Bed: 4: Min guard Details for Bed Mobility Assistance: Pt verbalizes I need help I am going to fall. pt with LOB to the Rt with scoot to EOB and needed (A) to regain balance. Pt needed BIL UE to maintain upright posture Transfers Sit to Stand: 3: Mod assist;With upper extremity assist;From chair/3-in-1 Stand to Sit: 3: Mod assist;With upper extremity assist;To chair/3-in-1 Details for Transfer Assistance: pt needed v/c for hand placment and to control descend to chair. pt with strong right lean with all mobility. pt with incr speed  and incr right lean. Ambulation/Gait Ambulation/Gait Assistance: 1: +2 Total assist Ambulation/Gait: Patient Percentage: 50% Ambulation Distance (Feet): 15 Feet (x 2) Assistive device: Rolling walker Ambulation/Gait Assistance Details: ataxic with right lateral lean needing mod/max assist for left weight shift to improve advancement of right LE. Gait Pattern: Step-to pattern;Decreased step length - right;Lateral trunk lean to right;Wide base of support;Ataxic;Trunk rotated posteriorly on right Modified Rankin (Stroke Patients Only) Pre-Morbid Rankin Score: No symptoms Modified Rankin: Moderately severe disability    Exercises Other Exercises Other Exercises: static standing weight shifting activities with mod facilitation, weight shifts only to midline   PT Diagnosis: Abnormality of gait  PT Problem List: Decreased mobility;Decreased coordination;Decreased balance;Decreased safety awareness;Other (comment) (impaired body spacial awareness, visuo-vestibular discrepanc) PT Treatment Interventions: DME instruction;Balance training;Gait training;Neuromuscular re-education;Functional mobility training;Patient/family education;Therapeutic activities;Therapeutic exercise   PT Goals Acute Rehab PT Goals PT Goal Formulation: With patient Time For Goal Achievement: 12/20/12 Potential to Achieve Goals: Good Pt will go Supine/Side to Sit: with modified independence PT Goal: Supine/Side to Sit - Progress: Goal set today Pt will Sit at Edge of Bed: with unilateral upper extremity support;with modified independence;6-10 min PT Goal: Sit at Edge Of Bed - Progress: Goal set today Pt will go Sit to Supine/Side: with supervision PT Goal: Sit to Supine/Side - Progress: Goal set today Pt will go Sit to Stand: with min assist;with upper extremity assist PT Goal: Sit to Stand - Progress: Goal set today Pt will go Stand to Sit: with min assist;with upper extremity assist PT Goal: Stand to Sit - Progress:  Goal set  today Pt will Stand: with supervision;with bilateral upper extremity support;3 - 5 min PT Goal: Stand - Progress: Goal set today Pt will Ambulate: 51 - 150 feet;with rolling walker;with min assist PT Goal: Ambulate - Progress: Goal set today Pt will Propel Wheelchair: > 150 feet;with supervision PT Goal: Propel Wheelchair - Progress: Goal set today  Visit Information  Last PT Received On: 12/06/12 Assistance Needed: +2 PT/OT Co-Evaluation/Treatment: Yes    Subjective Data  Subjective: I don't have a goal. Patient Stated Goal: To get up and walk.   Prior Functioning  Home Living Lives With: Alone Type of Home: Other (Comment) (boarding house) Home Access: Level entry Home Layout: One level Bathroom Shower/Tub: Walk-in shower;Door Dentist: None Prior Function Level of Independence: Independent Able to Take Stairs?: No Driving: No Vocation: Unemployed Comments: take the bus or walk Communication Communication: Expressive difficulties (slurred speech) Dominant Hand: Right    Cognition  Cognition Arousal/Alertness: Awake/alert Behavior During Therapy: WFL for tasks assessed/performed Overall Cognitive Status: Within Functional Limits for tasks assessed    Extremity/Trunk Assessment Right Upper Extremity Assessment RUE ROM/Strength/Tone: Within functional levels RUE Sensation: WFL - Light Touch RUE Coordination: WFL - gross/fine motor Left Upper Extremity Assessment LUE ROM/Strength/Tone: Within functional levels LUE Sensation: WFL - Light Touch LUE Coordination: WFL - gross/fine motor Right Lower Extremity Assessment RLE ROM/Strength/Tone: WFL for tasks assessed Left Lower Extremity Assessment LLE ROM/Strength/Tone: WFL for tasks assessed Trunk Assessment Trunk Assessment: Normal   Balance Balance Balance Assessed: Yes Static Sitting Balance Static Sitting - Balance Support: Bilateral upper extremity  supported;Feet supported Static Sitting - Level of Assistance: 4: Min assist;5: Stand by assistance Static Standing Balance Static Standing - Balance Support: Bilateral upper extremity supported;During functional activity Static Standing - Level of Assistance: 1: +2 Total assist (pt 50%)  End of Session PT - End of Session Equipment Utilized During Treatment: Gait belt Activity Tolerance: Patient limited by fatigue Patient left: in chair;with call bell/phone within reach  GP     [facility] 12/06/2012, 3:57 PM [place], [place] [phone] 12/06/2012

## 2012-12-06 NOTE — Progress Notes (Signed)
error 

## 2012-12-06 NOTE — Progress Notes (Signed)
OT/ PT Cancellation Note  Patient Details Name: [name] MRN: [id number] DOB: 17-Feb-1951   Cancelled Treatment:    Reason Eval/Treat Not Completed: Patient not medically ready (strict bedrest)  Lucile Shutters Pager: [phone]  12/06/2012, 8:22 AM

## 2012-12-06 NOTE — Progress Notes (Signed)
Echocardiogram 2D Echocardiogram has been performed.  12/06/2012 2:25 PM Gertie Fey, RDMS, RDCS

## 2012-12-06 NOTE — Consult Note (Signed)
Physical Medicine and Rehabilitation Consult  Reason for Consult: Slurred speech and left sided weakness Referring Physician:  Dr. Pearlean Brownie   HPI: [name] is a 62 y.o. male with history of HTN, COPD, prior CVA; who was admitted on 12/05/12 with acute onset of left hand weakness, unsteady gait and slurred speech. Noted to have elevated SBP-220. Patient reported falls x 3 earlier and dizziness that day. 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 Work up initiated and ST evaluation reveals signs of moderate to severe dysphagia with difficulty handling oral secretions therefore NPO recommended. MBS done with recommendations for alternative means of nutrition for now.Carotid dopplers without ICA stenosis.   PT/OT evaluations done revealing patient with poor spacial awareness with decreased vision, ataxia and severe balance deficits. MD recommending CIR  Dysarthric but is intelligible. Denies any pains. Has tube feeding infusing Review of Systems  HENT: Negative for hearing loss.   Eyes: Positive for blurred vision (wears glasses).  Respiratory: Positive for cough and wheezing.   Cardiovascular: Negative for chest pain and palpitations.  Gastrointestinal: Negative for heartburn, nausea and abdominal pain.       Hungry--wants to know when he can have food.   Musculoskeletal: Negative for myalgias and joint pain.  Neurological: Positive for speech change and focal weakness. Negative for dizziness, tingling, tremors and headaches.   Past Medical History  Diagnosis Date  . Hypertension 05/18/2007  . COPD 05/18/2007  . Elevated PSA 05/25/2007  . 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 Stroke 12/05/2012   Past Surgical History  Procedure Laterality Date  . 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 Liver disease Mother   . Heart attack Mother 36   Social  History:  Lives in a boarding house--reports friends there can provide some help past discharge. Used work put down carpet.--retired/disabled since 2012.  Independent PTA. He reports that he has been smoking  3 Cigarettes/day.  He has been smoking about 0.33 packs per day. He has never used smokeless tobacco. He reports that he drinks about 1/2 pint liquor daily. He reports that he does not use illicit drugs.   Allergies  Allergen Reactions  . Aspirin Hives  . Penicillins Hives   Medications Prior to Admission  Medication Sig Dispense Refill  . albuterol (PROVENTIL HFA;VENTOLIN HFA) 108 (90 BASE) MCG/ACT inhaler Inhale 2 puffs into the lungs every 6 (six) hours as needed for wheezing.  8.5 g  11  . clopidogrel (PLAVIX) 75 MG tablet Take 1 tablet (75 mg total) by mouth daily.  30 tablet  11  . hydrochlorothiazide (HYDRODIURIL) 25 MG tablet Take 1 tablet (25 mg total) by mouth daily.      Marland Kitchen lisinopril (PRINIVIL,ZESTRIL) 10 MG tablet Take 1 tablet (10 mg total) by mouth daily.  30 tablet  11  . pravastatin (PRAVACHOL) 80 MG tablet Take 1 tablet (80 mg total) by mouth daily.  30 tablet  11  . tiotropium (SPIRIVA HANDIHALER) 18 MCG inhalation capsule Place 1 capsule (18 mcg total) into inhaler and inhale daily.  30 capsule  11    Home: Home Living Lives With: Alone Type of Home: Other (Comment) (boarding house) Home Access: Level entry Home Layout: One level Bathroom Shower/Tub: Walk-in shower;Door [employer] Toilet: Standard Home Adaptive Equipment: None  Functional History: Prior Function Able to Take Stairs?: No  Driving: No Vocation: Unemployed Comments: take the bus or walk Functional Status:  Mobility: Bed Mobility Bed Mobility: Supine to Sit;Sitting - Scoot to [place] of Bed;Sit to Supine Supine to Sit: 4: Min guard;HOB flat Sitting - Scoot to [place] of Bed: 4: Min guard Transfers Sit to Stand: 3: Mod assist;With upper extremity assist;From chair/3-in-1 Stand to Sit: 3: Mod  assist;With upper extremity assist;To chair/3-in-1 Ambulation/Gait Ambulation/Gait Assistance: 1: +2 Total assist Ambulation/Gait: Patient Percentage: 50% Ambulation Distance (Feet): 15 Feet (x 2) Assistive device: Rolling walker Ambulation/Gait Assistance Details: ataxic with right lateral lean needing mod/max assist for left weight shift to improve advancement of right LE. Gait Pattern: Step-to pattern;Decreased step length - right;Lateral trunk lean to right;Wide base of support;Ataxic;Trunk rotated posteriorly on right    ADL: ADL Eating/Feeding: Set up Where Assessed - Eating/Feeding: Chair Grooming: Teeth care;Set up Where Assessed - Grooming: Supported sitting Toilet Transfer: Moderate assistance Toilet Transfer Method: Sit to stand Toilet Transfer Equipment: Raised toilet seat with arms (or 3-in-1 over toilet) Equipment Used: Gait belt;Rolling walker Transfers/Ambulation Related to ADLs: Pt with strong lean to the right side during all mobility and static standing. pt needs cues to reposition to neutral static standing. Pt is unable to sustain position at this time ADL Comments: Pt supien on arrival and states that he is hungry. Pt with excellent recall of therapist AManda name and last seen location as Radiology. Pt reports that friend Simona Huh could provide support upon d/c home however patient lives at boarding house and question assistance available. Pt currently requires (A) for all mobility total +2 for safety. pt provided oral kit . Pt demonstrates facial numbness and weakness. Pt able to pucker lips and stick out tongue. Pt however with assymetrical smile and slurred speech  Cognition: Cognition Overall Cognitive Status: Within Functional Limits for tasks assessed Arousal/Alertness: Awake/alert Orientation Level: Oriented X4 Cognition Arousal/Alertness: Awake/alert Behavior During Therapy: WFL for tasks assessed/performed Overall Cognitive Status: Within Functional Limits  for tasks assessed  Blood pressure 189/96, pulse 74, temperature 97.6 F (36.4 C), temperature source Oral, resp. rate 18, height 5\' 8"  (1.727 m), weight 78.2 kg (172 lb 6.4 oz), SpO2 94.00%.  Physical Exam  Nursing note and vitals reviewed. Constitutional: He is oriented to person, place, and time. He appears well-developed and well-nourished.  HENT:  Head: Normocephalic and atraumatic.  Tube feeding NG tube right nares, oxygen left nares small amount of dried blood  Eyes: Pupils are equal, round, and reactive to light. Right conjunctiva is injected. Left conjunctiva is injected.  Nystagmus right lateral field.   Neck: Normal range of motion.  Cardiovascular: Normal rate and regular rhythm.   Pulmonary/Chest: Effort normal. He has rhonchi in the right upper field and the left upper field.  Abdominal: Soft. Bowel sounds are normal.  Musculoskeletal: He exhibits no edema.  Infra patellar area with soft solid mass--ROM intact  Neurological: He is alert and oriented to person, place, and time.  Right facial weakness with right ptosis. Soft voice with occasional wet sounds and mild dysarthria. Right hemiparesis with decrease in fine motor control. Follows commands without difficulty.  Ataxia right finger nose to finger as well as right heel to shin. Past pointing left finger nose to finger  Skin: Skin is warm and dry.    Results for orders placed during the hospital encounter of 12/05/12 (from the past 24 hour(s))  ETHANOL     Status: Abnormal   Collection Time    12/05/12  6:51 PM  Result Value Range   Alcohol, Ethyl (B) 81 (*) 0 - 11 mg/dL  PROTIME-INR     Status: None   Collection Time    12/05/12  6:51 PM      Result Value Range   Prothrombin Time 13.0  11.6 - 15.2 seconds   INR 0.99  0.00 - 1.49  APTT     Status: None   Collection Time    12/05/12  6:51 PM      Result Value Range   aPTT 27  24 - 37 seconds  CBC     Status: Abnormal   Collection Time    12/05/12  6:51  PM      Result Value Range   WBC 9.4  4.0 - 10.5 K/uL   RBC 4.48  4.22 - 5.81 MIL/uL   Hemoglobin 13.3  13.0 - 17.0 g/dL   HCT 16.1 (*) 09.6 - 04.5 %   MCV 83.5  78.0 - 100.0 fL   MCH 29.7  26.0 - 34.0 pg   MCHC 35.6  30.0 - 36.0 g/dL   RDW 40.9  81.1 - 91.4 %   Platelets 275  150 - 400 K/uL  DIFFERENTIAL     Status: Abnormal   Collection Time    12/05/12  6:51 PM      Result Value Range   Neutrophils Relative 34 (*) 43 - 77 %   Neutro Abs 3.2  1.7 - 7.7 K/uL   Lymphocytes Relative 54 (*) 12 - 46 %   Lymphs Abs 5.1 (*) 0.7 - 4.0 K/uL   Monocytes Relative 9  3 - 12 %   Monocytes Absolute 0.9  0.1 - 1.0 K/uL   Eosinophils Relative 2  0 - 5 %   Eosinophils Absolute 0.2  0.0 - 0.7 K/uL   Basophils Relative 0  0 - 1 %   Basophils Absolute 0.0  0.0 - 0.1 K/uL  COMPREHENSIVE METABOLIC PANEL     Status: Abnormal   Collection Time    12/05/12  6:51 PM      Result Value Range   Sodium 139  135 - 145 mEq/L   Potassium 3.1 (*) 3.5 - 5.1 mEq/L   Chloride 105  96 - 112 mEq/L   CO2 21  19 - 32 mEq/L   Glucose, Bld 123 (*) 70 - 99 mg/dL   BUN 8  6 - 23 mg/dL   Creatinine, Ser 7.82  0.50 - 1.35 mg/dL   Calcium 9.7  8.4 - 95.6 mg/dL   Total Protein 8.3  6.0 - 8.3 g/dL   Albumin 3.2 (*) 3.5 - 5.2 g/dL   AST 40 (*) 0 - 37 U/L   ALT 36  0 - 53 U/L   Alkaline Phosphatase 87  39 - 117 U/L   Total Bilirubin 0.3  0.3 - 1.2 mg/dL   GFR calc non Af Amer 86 (*) >90 mL/min   GFR calc Af Amer >90  >90 mL/min  TROPONIN I     Status: None   Collection Time    12/05/12  6:51 PM      Result Value Range   Troponin I <0.30  <0.30 ng/mL  POCT I-STAT, CHEM 8     Status: Abnormal   Collection Time    12/05/12  6:55 PM      Result Value Range   Sodium 143  135 - 145 mEq/L   Potassium 3.1 (*) 3.5 - 5.1 mEq/L   Chloride 108  96 - 112 mEq/L   BUN 7  6 - 23 mg/dL   Creatinine, Ser 1.61  0.50 - 1.35 mg/dL   Glucose, Bld 096 (*) 70 - 99 mg/dL   Calcium, Ion 0.45  4.09 - 1.30 mmol/L   TCO2 23  0 - 100  mmol/L   Hemoglobin 13.9  13.0 - 17.0 g/dL   HCT 81.1  91.4 - 78.2 %  POCT I-STAT TROPONIN I     Status: None   Collection Time    12/05/12  7:00 PM      Result Value Range   Troponin i, poc 0.01  0.00 - 0.08 ng/mL   Comment 3           GLUCOSE, CAPILLARY     Status: None   Collection Time    12/05/12  8:40 PM      Result Value Range   Glucose-Capillary 99  70 - 99 mg/dL  HEMOGLOBIN N5A     Status: None   Collection Time    12/05/12  9:28 PM      Result Value Range   Hemoglobin A1C 5.5  <5.7 %   Mean Plasma Glucose 111  <117 mg/dL  TROPONIN I     Status: None   Collection Time    12/05/12  9:28 PM      Result Value Range   Troponin I <0.30  <0.30 ng/mL  URINE RAPID DRUG SCREEN (HOSP PERFORMED)     Status: None   Collection Time    12/05/12 10:07 PM      Result Value Range   Opiates NONE DETECTED  NONE DETECTED   Cocaine NONE DETECTED  NONE DETECTED   Benzodiazepines NONE DETECTED  NONE DETECTED   Amphetamines NONE DETECTED  NONE DETECTED   Tetrahydrocannabinol NONE DETECTED  NONE DETECTED   Barbiturates NONE DETECTED  NONE DETECTED  URINALYSIS, ROUTINE W REFLEX MICROSCOPIC     Status: Abnormal   Collection Time    12/05/12 10:07 PM      Result Value Range   Color, Urine YELLOW  YELLOW   APPearance CLEAR  CLEAR   Specific Gravity, Urine 1.016  1.005 - 1.030   pH 5.5  5.0 - 8.0   Glucose, UA NEGATIVE  NEGATIVE mg/dL   Hgb urine dipstick MODERATE (*) NEGATIVE   Bilirubin Urine NEGATIVE  NEGATIVE   Ketones, ur NEGATIVE  NEGATIVE mg/dL   Protein, ur NEGATIVE  NEGATIVE mg/dL   Urobilinogen, UA 0.2  0.0 - 1.0 mg/dL   Nitrite NEGATIVE  NEGATIVE   Leukocytes, UA NEGATIVE  NEGATIVE  URINE MICROSCOPIC-ADD ON     Status: Abnormal   Collection Time    12/05/12 10:07 PM      Result Value Range   WBC, UA 0-2  <3 WBC/hpf   RBC / HPF 7-10  <3 RBC/hpf   Bacteria, UA RARE  RARE   Casts HYALINE CASTS (*) NEGATIVE  MRSA PCR SCREENING     Status: None   Collection Time     12/06/12  1:38 AM      Result Value Range   MRSA by PCR NEGATIVE  NEGATIVE  TSH     Status: None   Collection Time    12/06/12  2:29 AM      Result Value Range   TSH 1.109  0.350 - 4.500 uIU/mL  MAGNESIUM     Status: None   Collection Time    12/06/12  2:29 AM  Result Value Range   Magnesium 1.7  1.5 - 2.5 mg/dL  PHOSPHORUS     Status: None   Collection Time    12/06/12  2:29 AM      Result Value Range   Phosphorus 3.2  2.3 - 4.6 mg/dL  BASIC METABOLIC PANEL     Status: Abnormal   Collection Time    12/06/12  2:29 AM      Result Value Range   Sodium 138  135 - 145 mEq/L   Potassium 3.8  3.5 - 5.1 mEq/L   Chloride 103  96 - 112 mEq/L   CO2 24  19 - 32 mEq/L   Glucose, Bld 143 (*) 70 - 99 mg/dL   BUN 8  6 - 23 mg/dL   Creatinine, Ser 4.09  0.50 - 1.35 mg/dL   Calcium 9.7  8.4 - 81.1 mg/dL   GFR calc non Af Amer >90  >90 mL/min   GFR calc Af Amer >90  >90 mL/min  TROPONIN I     Status: None   Collection Time    12/06/12  2:29 AM      Result Value Range   Troponin I <0.30  <0.30 ng/mL  TROPONIN I     Status: None   Collection Time    12/06/12  8:20 AM      Result Value Range   Troponin I <0.30  <0.30 ng/mL  BASIC METABOLIC PANEL     Status: Abnormal   Collection Time    12/06/12  8:20 AM      Result Value Range   Sodium 134 (*) 135 - 145 mEq/L   Potassium 4.1  3.5 - 5.1 mEq/L   Chloride 101  96 - 112 mEq/L   CO2 24  19 - 32 mEq/L   Glucose, Bld 105 (*) 70 - 99 mg/dL   BUN 8  6 - 23 mg/dL   Creatinine, Ser 9.14  0.50 - 1.35 mg/dL   Calcium 9.9  8.4 - 78.2 mg/dL   GFR calc non Af Amer >90  >90 mL/min   GFR calc Af Amer >90  >90 mL/min  CBC WITH DIFFERENTIAL     Status: Abnormal   Collection Time    12/06/12  8:20 AM      Result Value Range   WBC 10.4  4.0 - 10.5 K/uL   RBC 4.65  4.22 - 5.81 MIL/uL   Hemoglobin 14.1  13.0 - 17.0 g/dL   HCT 95.6  21.3 - 08.6 %   MCV 84.1  78.0 - 100.0 fL   MCH 30.3  26.0 - 34.0 pg   MCHC 36.1 (*) 30.0 - 36.0 g/dL   RDW  57.8  46.9 - 62.9 %   Platelets 279  150 - 400 K/uL   Neutrophils Relative 68  43 - 77 %   Neutro Abs 7.1  1.7 - 7.7 K/uL   Lymphocytes Relative 24  12 - 46 %   Lymphs Abs 2.5  0.7 - 4.0 K/uL   Monocytes Relative 8  3 - 12 %   Monocytes Absolute 0.8  0.1 - 1.0 K/uL   Eosinophils Relative 0  0 - 5 %   Eosinophils Absolute 0.0  0.0 - 0.7 K/uL   Basophils Relative 0  0 - 1 %   Basophils Absolute 0.0  0.0 - 0.1 K/uL  LIPID PANEL     Status: None   Collection Time    12/06/12  8:20 AM  Result Value Range   Cholesterol 146  0 - 200 mg/dL   Triglycerides 045  <409 mg/dL   HDL 47  >81 mg/dL   Total CHOL/HDL Ratio 3.1     VLDL 21  0 - 40 mg/dL   LDL Cholesterol 78  0 - 99 mg/dL   Ct Head Wo Contrast  Mr Maxine Glenn Head Wo Contrast  12/05/2012  *RADIOLOGY REPORT*  Clinical Data:  Left-sided weakness.  MRI HEAD WITHOUT CONTRAST MRA HEAD WITHOUT CONTRAST  Technique:  Multiplanar, multiecho pulse sequences of the brain and surrounding structures were obtained without intravenous contrast. Angiographic images of the head were obtained using MRA technique without contrast.  Comparison:   None  MRI HEAD  Findings:  There is swelling and restricted diffusion in the region of the genu of the corpus callosum more towards the right.  This probably represents an acute/subacute infarction.  The differential diagnosis does include neoplastic disease, but that is felt less likely. There is also probably an acute infarction at the inferior cerebellum on the right.  I have some concern that this could be artifactual, but there is loss of flow in the right vertebral artery since the previous study, further evidence of this is probably an acute infarction.  There is an old inferior cerebellar infarction on the right.  Elsewhere, the patient has chronic small vessel disease throughout the white matter and an old cortical and subcortical infarction at the right parietal vertex.  No evidence of hemorrhage, hydrocephalus or  extra-axial collection.  IMPRESSION: Acute or subacute abnormality at the genu of the corpus callosum more to the right of midline.  This is most consistent with infarction.  The differential diagnosis does include tumor, but that is felt considerably less likely.  Probable acute infarction at the inferior cerebellum on the right. Loss of flow in the right vertebral artery since the previous study.  Extensive chronic small vessel disease elsewhere throughout the brain.  Old cortical and subcortical infarction at the vertex on the right.  MRA HEAD  Findings: Both internal carotid arteries are patent into the brain. There is atherosclerotic irregularity in the siphon region with narrowing, worse on the left than the right.  The anterior and middle cerebral vessels show flow, but there are multiple stenoses that have worsened considerably since the previous study.  The left vertebral artery remains patent to the basilar.  The right vertebral artery is now occluded except for a small amount of retrograde flow distally.  No basilar stenosis.  Superior cerebellar and posterior cerebral arteries continue to show flow.  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 Only a small amount of retrograde flow in the distal part of that vessel.  The left vertebral artery remains widely patent to the basilar.  Marked progression of atherosclerotic disease throughout the anterior and middle cerebral branches bilaterally, worse on the left than the right.   Original Report Authenticated By: Paulina Fusi, M.D.    [name]  12/05/2012  *RADIOLOGY REPORT*  Clinical Data:  Left-sided weakness.  MRI HEAD WITHOUT CONTRAST MRA HEAD WITHOUT CONTRAST  Technique:  Multiplanar, multiecho pulse sequences of the brain and surrounding structures were obtained without intravenous contrast. Angiographic images of the head were obtained using MRA technique without contrast.  Comparison:   None  MRI HEAD   Findings:  There is swelling and restricted diffusion in the region of the genu of the corpus callosum more towards the right.  This probably  represents an acute/subacute infarction.  The differential diagnosis does include neoplastic disease, but that is felt less likely. There is also probably an acute infarction at the inferior cerebellum on the right.  I have some concern that this could be artifactual, but there is loss of flow in the right vertebral artery since the previous study, further evidence of this is probably an acute infarction.  There is an old inferior cerebellar infarction on the right.  Elsewhere, the patient has chronic small vessel disease throughout the white matter and an old cortical and subcortical infarction at the right parietal vertex.  No evidence of hemorrhage, hydrocephalus or extra-axial collection.  IMPRESSION: Acute or subacute abnormality at the genu of the corpus callosum more to the right of midline.  This is most consistent with infarction.  The differential diagnosis does include tumor, but that is felt considerably less likely.  Probable acute infarction at the inferior cerebellum on the right. Loss of flow in the right vertebral artery since the previous study.  Extensive chronic small vessel disease elsewhere throughout the brain.  Old cortical and subcortical infarction at the vertex on the right.  MRA HEAD  Findings: Both internal carotid arteries are patent into the brain. There is atherosclerotic irregularity in the siphon region with narrowing, worse on the left than the right.  The anterior and middle cerebral vessels show flow, but there are multiple stenoses that have worsened considerably since the previous study.  The left vertebral artery remains patent to the basilar.  The right vertebral artery is now occluded except for a small amount of retrograde flow distally.  No basilar stenosis.  Superior cerebellar and posterior cerebral arteries continue to show flow.   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 Only a small amount of retrograde flow in the distal part of that vessel.  The left vertebral artery remains widely patent to the basilar.  Marked progression of atherosclerotic disease throughout the anterior and middle cerebral branches bilaterally, worse on the left than the right.   Original Report Authenticated By: Paulina Fusi, M.D.    Dg Chest Port 1 View  12/05/2012  *RADIOLOGY REPORT*  Clinical Data: Altered mental status.  Hypertension.  PORTABLE CHEST - 1 VIEW  Comparison: 12/05/2009.  Findings: Monitoring leads are projected over the chest. Cardiopericardial silhouette within normal limits.  No focal consolidation is identified.  Tortuous thoracic aorta.  Lung volumes are lower than on prior exam.  No effusion.  IMPRESSION: Suboptimal inspiration.  No acute cardiopulmonary disease.   Original Report Authenticated By: Andreas Newport, M.D.    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 Does the need for close, 24 hr/day medical supervision in concert with the patient's rehab needs make it unreasonable for this patient to be served in a less intensive setting? Yes 2. Co-Morbidities requiring supervision/potential complications: Malignant hypertension, alcohol abuse, COPD, left patellar tendon mass 3. Due to bladder management, bowel management, safety, skin/wound care, disease management, medication administration and patient education, does the patient require 24 hr/day rehab nursing? Yes 4. Does the patient require coordinated care of a physician, rehab nurse, PT (1-2 hrs/day, 5 days/week), OT (1-2 hrs/day, 5 days/week) and SLP (0.5-1 hrs/day, 5 days/week) to address physical and functional deficits in the context of the above medical diagnosis(es)? Yes Addressing deficits in the following areas:  balance, endurance, locomotion, strength, transferring, bowel/bladder control, bathing, dressing, feeding, grooming, toileting and speech  5. Can the patient actively participate in an intensive therapy program of at least 3 hrs of therapy per day at least 5 days per week? Yes 6. The potential for patient to make measurable gains while on inpatient rehab is good 7. Anticipated functional outcomes upon discharge from inpatient rehab are Supervision with PT, Supervision to minimal assist ADLs with OT, Safe by mouth intake fluids and solids adequate amounts with SLP. 8. Estimated rehab length of stay to reach the above functional goals is: 2-weeks 9. Does the patient have adequate social supports to accommodate these discharge functional goals? Potentially 10. Anticipated D/C setting: Home 11. Anticipated post D/C treatments: HH therapy 12. Overall Rehab/Functional Prognosis: good  RECOMMENDATIONS: This patient's condition is appropriate for continued rehabilitative care in the following setting: CIR Patient has agreed to participate in recommended program. Potentially Note that insurance prior authorization may be required for reimbursement for recommended care.  Comment:    12/06/2012

## 2012-12-06 NOTE — Procedures (Signed)
Objective Swallowing Evaluation: Modified Barium Swallowing Study  Patient Details  Name: [name] MRN: [id number] Date of Birth: 10-24-1950  Today's Date: 12/06/2012 Time: 8657-8469 SLP Time Calculation (min): 30 min  Past Medical History:  Past Medical History  Diagnosis Date  . Hypertension 05/18/2007  . COPD 05/18/2007  . Elevated PSA 05/25/2007  . 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 Stroke 12/05/2012   Past Surgical History:  Past Surgical History  Procedure Laterality Date  . 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 Bedside swallow eval resulted in recs for NPO and MBS.       Assessment / Plan / Recommendation Clinical Impression  Dysphagia Diagnosis: Severe pharyngeal phase dysphagia  Clinical impression: Pt presents with a severe primary pharyngeal phase dysphagia.  There was minimal mobility of the hyolaryngeal apparatus, leading to marginal movement of the epiglottis over the airway, and significantly impaired opening of the UES. There was also severely reduced pharyngeal wall movement and asymmetry of the pharynx, presumably on the right.  These motor deficits led to severe PO residuals which sat in hypopharynx and penetrated larynx. Pt was unable to achieve sufficient mobility to propel material into UES.  Coughing was not effective in removing residuals.   Study was discontinued, oral suctioning was provided in an effort to remove residuals, pt desaturated into the 60s and O2 was immediately provided. Sp02 then increased into mid 90s.   Pt is high aspiration risk for POs and secretions.   Recommend NPO with consideration of  temporary enteral feeding.  Will initiate dysphagia therapy and follow for improvements.    Treatment Recommendation  Therapeutic exercises    Diet Recommendation Alternative means - temporary   Medication Administration: Via  alternative means    Other  Recommendations Recommended Consults: MBS Oral Care Recommendations: Oral care QID   Follow Up Recommendations   (pending PT/OT evals)    Frequency and Duration min 3x week  2 weeks   Pertinent Vitals/Pain No pain    SLP Swallow Goals Goal #3: Pt will execute effortful swallow five times per session with max assist Goal #4: Pt will execute pharyngeal strengthening exercises with min cues 10x/session   General Date of Onset: 12/05/12 HPI: 62 year old male admitted with 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 Bedside swallow eval resulted in recs for NPO and MBS.   Type of Study: Modified Barium Swallowing Study Reason for Referral: Objectively evaluate swallowing function Previous Swallow Assessment: bedside swallow eval same date Diet Prior to this Study: NPO Temperature Spikes Noted: No Respiratory Status: Room air History of Recent Intubation: No Behavior/Cognition: Alert;Cooperative;Pleasant mood Oral Motor / Sensory Function: Impaired - see Bedside swallow eval Self-Feeding Abilities: Able to feed self Patient Positioning: Upright in chair Baseline Vocal Quality: Wet;Low vocal intensity Volitional Cough: Strong Volitional Swallow: Able to elicit Anatomy: Within functional limits Pharyngeal Secretions: Not observed secondary MBS    Reason for Referral Objectively evaluate swallowing function   Oral Phase Oral Preparation/Oral Phase Oral Phase: Impaired Oral - Nectar Oral - Nectar Teaspoon: Right anterior bolus loss   Pharyngeal Phase Pharyngeal Phase Pharyngeal Phase: Impaired Pharyngeal - Nectar Pharyngeal - Nectar Teaspoon: Delayed swallow initiation;Reduced pharyngeal peristalsis;Reduced epiglottic inversion;Reduced laryngeal elevation;Reduced airway/laryngeal closure;Penetration/Aspiration before swallow;Penetration/Aspiration during swallow;Penetration/Aspiration after swallow Penetration/Aspiration details (nectar  teaspoon): Material enters airway, remains ABOVE vocal cords and not ejected out Pharyngeal -  Solids Pharyngeal - Puree: Delayed swallow initiation;Reduced pharyngeal peristalsis;Reduced epiglottic inversion;Reduced laryngeal elevation;Reduced airway/laryngeal closure;Penetration/Aspiration before swallow;Penetration/Aspiration during swallow;Penetration/Aspiration after swallow Penetration/Aspiration details (puree): Material enters airway, remains ABOVE vocal cords and not ejected out  Cervical Esophageal Phase       Brynn Reznik L. [place], [place] CCC/SLP Pager [phone]  Cervical Esophageal Phase Cervical Esophageal Phase: Impaired Cervical Esophageal Phase - Nectar Nectar Teaspoon: Esophageal backflow into the pharynx         [name] [name] 12/06/2012, 11:41 AM

## 2012-12-06 NOTE — Significant Event (Signed)
Pt failed swallow evaluation.  Will have NG tube placed for meds and tube feeds.  Coralyn Helling, MD [facility] Pulmonary/Critical Care 12/06/2012, 4:35 PM Pager:  [phone] After 3pm call: [phone]

## 2012-12-06 NOTE — Progress Notes (Signed)
As per OT note will hold for OOB activity orders. [place], [place] [phone] 12/06/2012

## 2012-12-06 NOTE — Progress Notes (Signed)
Stroke Team Progress Note  HISTORY [name] is an 62 y.o. male who was seen by his family to have acute left sided weakness with slurred speech at 1730 today, 12/05/2012. EMS arrived, SBP~220. Patient complained of concurrent headache. He does have hypertension and did take his medications today. En route EMS states that his symptoms were resolving although his blood pressure remained high. He does remain sleepy but easy to awake. Patient was not initially felt to be a TPA candidate secondary to NIHSS=1. He was admitted for further evaluation and treatment.  Re-evaluation in the ED: MRI shows a right cerebellar and right corpus callossal acute infarcts. MRA shows nonvisualized right vertebral. Repeat examination shows mild right ptosis, right nystagmus, ?mild right facial droop, slurred speech (felt to be baseline), normal symmetric strength and no evidence of dysmetria. Plantars downgoing.  BP on return from MRI is 238/122. 20mg  of Labetolol given with repeat BP of 211/112. Heart rate responding. Patient ordered to start a Cardene drip. SBP to be maintained at 180-190.   SUBJECTIVE   OBJECTIVE Most recent Vital Signs: Filed Vitals:   12/06/12 0700 12/06/12 0800 12/06/12 0830 12/06/12 0845  BP: 158/107 207/115 181/95 194/97  Pulse: 75 72 73 75  Temp: 97.5 F (36.4 C)     TempSrc: Oral     Resp: 26 20 21 23   Height:      Weight:      SpO2: 96% 100% 99% 99%   CBG (last 3)   Recent Labs  12/05/12 2040  GLUCAP 99    IV Fluid Intake:   . niCARDipine 3 mg/hr (12/06/12 0911)    MEDICATIONS  . atorvastatin  80 mg Oral q1800  . clopidogrel  75 mg Oral Daily  . heparin  5,000 Units Subcutaneous Q8H  . hydrochlorothiazide  25 mg Oral Daily  . lisinopril  10 mg Oral Daily  . pantoprazole (PROTONIX) IV  40 mg Intravenous Q24H  . [START ON 12/07/2012] pneumococcal 23 valent vaccine  0.5 mL Intramuscular Tomorrow-1000  . sodium chloride  3 mL Intravenous Q12H  . sodium chloride  3  mL Intravenous Q12H  . tiotropium  18 mcg Inhalation Daily   PRN:  sodium chloride, albuterol, ondansetron, sodium chloride  Diet:  NPO  Activity:  Bedrest DVT Prophylaxis:  Heparin 5000 units sq tid  CLINICALLY SIGNIFICANT STUDIES Basic Metabolic Panel:  Recent Labs Lab 12/05/12 1851 12/05/12 1855 12/06/12 0229  NA 139 143 138  K 3.1* 3.1* 3.8  CL 105 108 103  CO2 21  --  24  GLUCOSE 123* 126* 143*  BUN 8 7 8   CREATININE 0.96 1.20 0.71  CALCIUM 9.7  --  9.7  MG  --   --  1.7  PHOS  --   --  3.2   Liver Function Tests:  Recent Labs Lab 12/05/12 1851  AST 40*  ALT 36  ALKPHOS 87  BILITOT 0.3  PROT 8.3  ALBUMIN 3.2*   CBC:  Recent Labs Lab 12/05/12 1851 12/05/12 1855 12/06/12 0820  WBC 9.4  --  10.4  NEUTROABS 3.2  --  7.1  HGB 13.3 13.9 14.1  HCT 37.4* 41.0 39.1  MCV 83.5  --  84.1  PLT 275  --  279   Coagulation:  Recent Labs Lab 12/05/12 1851  LABPROT 13.0  INR 0.99   Cardiac Enzymes:  Recent Labs Lab 12/05/12 1851 12/05/12 2128 12/06/12 0229  TROPONINI <0.30 <0.30 <0.30   Urinalysis:  Recent Labs Lab  12/05/12 2207  COLORURINE YELLOW  LABSPEC 1.016  PHURINE 5.5  GLUCOSEU NEGATIVE  HGBUR MODERATE*  BILIRUBINUR NEGATIVE  KETONESUR NEGATIVE  PROTEINUR NEGATIVE  UROBILINOGEN 0.2  NITRITE NEGATIVE  LEUKOCYTESUR NEGATIVE   Lipid Panel    Component Value Date/Time   CHOL 182 10/18/2012 0927   TRIG 146 10/18/2012 0927   HDL 53 10/18/2012 0927   CHOLHDL 3.4 10/18/2012 0927   VLDL 29 10/18/2012 0927   LDLCALC 100* 10/18/2012 0927   HgbA1C  Lab Results  Component Value Date   HGBA1C 5.5 12/05/2012    Urine Drug Screen:     Component Value Date/Time   LABOPIA NONE DETECTED 12/05/2012 2207   LABOPIA NEGATIVE 10/14/2010 2140   COCAINSCRNUR NONE DETECTED 12/05/2012 2207   COCAINSCRNUR  Value: POSITIVE (NOTE) Result repeated and verified. Sent for confirmatory testing* 10/14/2010 2140   LABBENZ NONE DETECTED 12/05/2012 2207   LABBENZ NEGATIVE  10/14/2010 2140   AMPHETMU NONE DETECTED 12/05/2012 2207   AMPHETMU NEGATIVE 10/14/2010 2140   THCU NONE DETECTED 12/05/2012 2207   LABBARB NONE DETECTED 12/05/2012 2207    Alcohol Level:  Recent Labs Lab 12/05/12 1851  ETH 81*   CT of the brain  12/05/2012  Atrophy and chronic ischemic white matter disease without acute intracranial abnormality.  Chronic left paranasal sinus disease.   MRI of the brain  12/05/2012   Acute or subacute abnormality at the genu of the corpus callosum more to the right of midline.  This is most consistent with infarction.  The differential diagnosis does include tumor, but that is felt considerably less likely.  Probable acute infarction at the inferior cerebellum on the right. Loss of flow in the right vertebral artery since the previous study.  Extensive chronic small vessel disease elsewhere throughout the brain.  Old cortical and subcortical infarction at the vertex on the right.    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 Only a small amount of retrograde flow in the distal part of that vessel.  The left vertebral artery remains widely patent to the basilar.  Marked progression of atherosclerotic disease throughout the anterior and middle cerebral branches bilaterally, worse on the left than the right.      2D Echocardiogram  12/05/2012   Suboptimal inspiration.  No acute cardiopulmonary disease.    Carotid Doppler    TCD  CXR    EKG  normal sinus rhythm, RBBB, 1st degree AV block.   Therapy Recommendations   Physical Exam   Elderly african [place] male not in distress.Awake alert. Afebrile. Head is nontraumatic. Neck is supple without bruit. Hearing is normal. Cardiac exam no murmur or gallop. Lungs are clear to auscultation. Distal pulses are well felt. Neurological Exam ; awake alert oriented x3. Moderate dysarthria but can be understood. Eyes and primary position extraocular movements are full range  with nystagmus on lateral gaze right more than left. Pupils are equal reactive. Fundi were not visualized. There is severe right lower face weakness. Tongue is midline. Cough and gag aren't week. No upper or lower eczema to drift. Diminished fine finger movements on the left. Finger-to-nose dysmetria on the right upper extremity. No focal weakness. Touch and pinprick sensation are preserved. Deep tendon pulses are 2+ symmetric. Plantars are downgoing. ASSESSMENT [name] is a 62 y.o. male presenting with left sided weakness and slurred speech. Imaging confirms a right  genu of the corpus callosum, right inferior cerebellum  infarct. Also, nonvisualized right vertebral artery. Etiology of Infarct unclear. On clopidogrel 75 mg orally every day prior to admission. Now on clopidogrel 75 mg orally every day for secondary stroke prevention - though patient unable to swallow. Patient with resultant right facial weakness, right ptosis. Work up underway.  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   Check swallow - MBSS later today  Check lipids  F/u carotid doppler and 2D echo  PT, OT, ST - CIR evals  Annie Main, MSN, RN, ANVP-BC, ANP-BC, GNP-BC [facility] Pager: [phone] 12/06/2012 9:28 AM  I have personally obtained a history, examined the patient, evaluated imaging results, and formulated the assessment and plan of care. I agree with the above. Delia Heady, MD

## 2012-12-06 NOTE — H&P (Signed)
INTERNAL MEDICINE TEACHING SERVICE Attending Note  Date: 12/06/2012  Patient name: [name]  Medical record number: [id number]  Date of birth: 10-Feb-1951    This patient has been discussed with the house staff. Please see their note for complete details. Patient with acute right cerebellar and right corpus callosum acute infarcts, with hypertensive emergency.  Evaluated by PCCM and neurology admitted to intensive care unit service.  Jonah Blue, DO  12/06/2012, 2:52 PM

## 2012-12-07 ENCOUNTER — Encounter (HOSPITAL_COMMUNITY): Payer: Self-pay | Admitting: Radiology

## 2012-12-07 ENCOUNTER — Inpatient Hospital Stay (HOSPITAL_COMMUNITY): Payer: Medicaid Other

## 2012-12-07 DIAGNOSIS — I6340 Cerebral infarction due to embolism of unspecified cerebral artery: Secondary | ICD-10-CM

## 2012-12-07 DIAGNOSIS — I69993 Ataxia following unspecified cerebrovascular disease: Secondary | ICD-10-CM

## 2012-12-07 LAB — MAGNESIUM: Magnesium: 2 mg/dL (ref 1.5–2.5)

## 2012-12-07 LAB — BASIC METABOLIC PANEL
Chloride: 105 mEq/L (ref 96–112)
GFR calc Af Amer: >90 mL/min (ref >90)
Potassium: 3.6 mEq/L (ref 3.5–5.1)

## 2012-12-07 LAB — GLUCOSE, CAPILLARY: Glucose-Capillary: 98 mg/dL (ref 70–99)

## 2012-12-07 MED ORDER — SIMVASTATIN 40 MG PO TABS: 40.0000 mg | ORAL_TABLET | Freq: Every day | ORAL | Status: DC

## 2012-12-07 MED ORDER — FREE WATER
160.0000 mL | Freq: Four times a day (QID) | Status: DC
  Administered 2012-12-07 – 2012-12-08 (×6): 160 mL

## 2012-12-07 MED ORDER — HYDRALAZINE HCL 20 MG/ML IJ SOLN: 10.0000 mg | INTRAMUSCULAR | Status: DC | PRN

## 2012-12-07 MED ORDER — OSMOLITE 1.2 CAL PO LIQD
1000.0000 mL | ORAL | Status: DC
  Administered 2012-12-07: 1000 mL
  Filled 2012-12-07 (×2): qty 1000

## 2012-12-07 MED ORDER — JEVITY 1.2 CAL PO LIQD
1000.0000 mL | ORAL | Status: DC
  Administered 2012-12-07: 1000 mL
  Administered 2012-12-08: 65 mL
  Filled 2012-12-07 (×5): qty 1000

## 2012-12-07 NOTE — Progress Notes (Signed)
PULMONARY  / CRITICAL CARE MEDICINE  Name: [name] MRN: [id number] DOB: 08-Jun-1951    ADMISSION DATE:  12/05/2012 CONSULTATION DATE:  12/05/2012  REFERRING MD :  EDP PRIMARY SERVICE: PCCM  CHIEF COMPLAINT:  Generalized weakness   BRIEF PATIENT DESCRIPTION: 62 yo with h/o CVA admitted with acute right cerebellar and right corpus callosum infarcts and hypertensive emergency.  SIGNIFICANT EVENTS / STUDIES:  4/28  Head CT  >>>Atrophy and chronic ischemic white matter disease without acute<BR>intracranial abnormality. Chronic left paranasal sinus disease 4/28  Brain MRI >>> acute right cerebellar and right corpus callosum infarcts 4/30 ct head - Stable appearance   LINES / TUBES:  CULTURES:  ANTIBIOTICS:  Overnight: Off nicardipine drip NGT placed as failed slp  VITAL SIGNS: Temp:  [97 F (36.1 C)-98.1 F (36.7 C)] 97.8 F (36.6 C) (04/30 0730) Pulse Rate:  [65-83] 74 (04/30 0700) Resp:  [18-32] 21 (04/30 0730) BP: (123-198)/(71-101) 179/101 mmHg (04/30 0730) SpO2:  [70 %-100 %] 98 % (04/30 0700)  HEMODYNAMICS:   VENTILATOR SETTINGS:   INTAKE / OUTPUT: Intake/Output     04/29 0701 - 04/30 0700 04/30 0701 - 05/01 0700   I.V. (mL/kg) 681.8 (8.7)    IV Piggyback 100    Total Intake(mL/kg) 781.8 (10)    Urine (mL/kg/hr) 1950 (1) 225 (1.5)   Total Output 1950 225   Net -1168.2 -225          PHYSICAL EXAMINATION: General:  No distress Neuro:  Alert and Oriented x3, no changes in exam  HEENT:  PERRL, mild right ptosis, right facial droop mild Cardiovascular:  S1, s2, nl rate Lungs:  CTA Abdomen:  Soft, nontender, nondistended, normoactive BS Musculoskeletal:  strenght 5/5 in UE and LE, hand grip 5/5 bilaterally, poor finger nose Skin:  Dry skin in LE  LABS:  Recent Labs Lab 12/05/12 1851 12/05/12 1855 12/05/12 2128 12/06/12 0229 12/06/12 0820 12/07/12 0345  HGB 13.3 13.9  --   --  14.1  --   WBC 9.4  --   --   --  10.4  --   PLT 275  --   --   --   279  --   NA 139 143  --  138 134* 138  K 3.1* 3.1*  --  3.8 4.1 3.6  CL 105 108  --  103 101 105  CO2 21  --   --  24 24 26   GLUCOSE 123* 126*  --  143* 105* 95  BUN 8 7  --  8 8 8   CREATININE 0.96 1.20  --  0.71 0.69 0.75  CALCIUM 9.7  --   --  9.7 9.9 9.7  MG  --   --   --  1.7  --  2.0  PHOS  --   --   --  3.2  --  2.9  AST 40*  --   --   --   --   --   ALT 36  --   --   --   --   --   ALKPHOS 87  --   --   --   --   --   BILITOT 0.3  --   --   --   --   --   PROT 8.3  --   --   --   --   --   ALBUMIN 3.2*  --   --   --   --   --  APTT 27  --   --   --   --   --   INR 0.99  --   --   --   --   --   TROPONINI <0.30  --  <0.30 <0.30 <0.30  --     Recent Labs Lab 12/05/12 2040  GLUCAP 99   CXR: 4/28 >>> hint fluid rt fissue  ASSESSMENT / PLAN:  PULMONARY A: COPD, no evidence of exacerbation. P:   Albuterol inhaler q6h PRN Spiriva No active wheezing  CARDIOVASCULAR A: Hypertensive emergency.  Dyslipidemia., no evidence ischemia P:  Nicardipine drip titration with goal of SBP of 180-190, permissive x 2-3 days Once able to swallow, will load oral meds to dc drip Continue Lisinopril, HCTZ add now Add prn hydrazine May need additional HTN meds, eval after home regimen started  RENAL A:  Hypokalemia improved P:   Trend BMETin am  Neg balance tolerated on own  GASTROINTESTINAL A:  Emesis - resolved, dysphagia P:   Failed slp Start TF Asp precautions  HEMATOLOGIC A:  dvt prevention P:  lovenox ( in setting cva) No need cbc  INFECTIOUS A:  No active issues. P:   No intervention required  ENDOCRINE A:  Hyperglycemia. P:   SSI  NEUROLOGIC A: Acute right cerebellar and right corpus callossal infarcts - likely ischemic: cardioembolic v atherosclerosis. Presented outside tPA window, Neurology following.  P:   Per Neurology Plavix through tube Asas allergy Pt/ot active  TODAY'S SUMMARY:  Off nicardipine, adding oral home meds, to tele, to  triad  I have personally obtained a history, examined the patient, evaluated laboratory and imaging results, formulated the assessment and plan and placed orders.  Nelda Bucks., MD Pulmonary and Critical Care Medicine [facility] Pager: [phone]  12/07/2012, 8:54 AM

## 2012-12-07 NOTE — Progress Notes (Signed)
INITIAL NUTRITION ASSESSMENT  DOCUMENTATION CODES Per approved criteria  -Not Applicable   INTERVENTION: 1. Initiate Jevity 1.2 @ 20 ml/hr via NG tube and increase by 10 ml every 4 hours to goal rate of 65 ml/hr. At goal rate, tube feeding regimen will provide 1872 kcal, 86 grams of protein, and 1263 ml of H2O. Total free water: 1903 ml/day  2. 160 ml H2O flush every 6 hours   NUTRITION DIAGNOSIS: Inadequate oral intake related to inability to eat as evidenced by NPO status.  Goal: Pt to meet >/= 90% of their estimated nutrition needs.   Monitor:  TF tolerance, vent status, weight   Reason for Assessment: Consult received to initiate and manage enteral nutrition support.  62 y.o. male  Admitting Dx: Cerebral embolism with cerebral infarction  ASSESSMENT: Pt admitted with right cerebellar and right corpus callossal acute infarcts. Pt with malignant HTN. Pt discussed during ICU rounds and with RN.  Consult received to start enteral nutrition after pt failed MBS 4/29.   Pt with NG tube in place in the gastric fundus    Height: Ht Readings from Last 1 Encounters:  12/06/12 5\' 8"  (1.727 m)    Weight: Wt Readings from Last 1 Encounters:  12/06/12 172 lb 6.4 oz (78.2 kg)  No new weight  Ideal Body Weight: 70 kg  % Ideal Body Weight: 112%  Wt Readings from Last 10 Encounters:  12/06/12 172 lb 6.4 oz (78.2 kg)  11/07/12 177 lb 11.2 oz (80.604 kg)  10/18/12 181 lb 8 oz (82.328 kg)  05/25/07 152 lb 14.4 oz (69.355 kg)    Usual Body Weight: 175 lb  % Usual Body Weight: 98%  BMI:  Body mass index is 26.22 kg/(m^2). Overweight  Estimated Nutritional Needs: Kcal: 1850-2050 Protein: 85-100 grams Fluid: > 1.9 L/day  Skin: no issues noted  Diet Order:   NPO  EDUCATION NEEDS: -No education needs identified at this time   Intake/Output Summary (Last 24 hours) at 12/07/12 0841 Last data filed at 12/07/12 0600  Gross per 24 hour  Intake 661.83 ml  Output   1300  ml  Net -638.17 ml    Last BM: 4/28   Labs:   Recent Labs Lab 12/05/12 1855 12/06/12 0229 12/06/12 0820 12/07/12 0345  NA 143 138 134* 138  K 3.1* 3.8 4.1 3.6  CL 108 103 101 105  CO2  --  24 24 26   BUN 7 8 8 8   CREATININE 1.20 0.71 0.69 0.75  CALCIUM  --  9.7 9.9 9.7  MG  --  1.7  --  2.0  PHOS  --  3.2  --  2.9  GLUCOSE 126* 143* 105* 95    CBG (last 3)   Recent Labs  12/05/12 2040  GLUCAP 99   Lab Results  Component Value Date   HGBA1C 5.5 12/05/2012    Scheduled Meds: . antiseptic oral rinse  15 mL Mouth Rinse q12n4p  . atorvastatin  80 mg Oral q1800  . chlorhexidine  15 mL Mouth Rinse BID  . clopidogrel  75 mg Oral Daily  . enoxaparin (LOVENOX) injection  40 mg Subcutaneous Q24H  . hydrochlorothiazide  25 mg Oral Daily  . lisinopril  10 mg Oral Daily  . pantoprazole (PROTONIX) IV  40 mg Intravenous Q24H  . pneumococcal 23 valent vaccine  0.5 mL Intramuscular Tomorrow-1000  . sodium chloride  3 mL Intravenous Q12H  . tiotropium  18 mcg Inhalation Daily    Continuous Infusions: .  niCARDipine Stopped (12/06/12 2300)    Past Medical History  Diagnosis Date  . Hypertension 05/18/2007  . COPD 05/18/2007  . Elevated PSA 05/25/2007  . 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 Stroke 12/05/2012    Past Surgical History  Procedure Laterality Date  . 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

## 2012-12-07 NOTE — Progress Notes (Signed)
Occupational Therapy Treatment Patient Details Name: [name] MRN: [id number] DOB: 1951-04-24 Today's Date: 12/07/2012 Time: 1191-4782 OT Time Calculation (min): 29 min  OT Assessment / Plan / Recommendation Comments on Treatment Session Pt demonstrates incr awareness deficits, verbalizes blurred vision and requesting food. Nystagmus noted to be more intense this session and horizontal beating.    Follow Up Recommendations  SNF    Barriers to Discharge       Equipment Recommendations  3 in 1 bedside comode    Recommendations for Other Services    Frequency Min 3X/week   Plan Discharge plan remains appropriate    Precautions / Restrictions Precautions Precautions: Fall   Pertinent Vitals/Pain BP monitored 170s over 90s    ADL  Eating/Feeding: NPO (requesting food) Grooming: Wash/dry face;Set up Where Assessed - Grooming: Supported sitting Toilet Transfer: Minimal assistance Statistician Method: Sit to Barista: Raised toilet seat with arms (or 3-in-1 over toilet) Equipment Used: Gait belt;Rolling walker Transfers/Ambulation Related to ADLs: Pt demonstrates deficits with somatosensory (toe lifting off floor, reports "kinda feel it" when asked if he can feel his feet on the floor, and supination of  foot) Pt scissoring with ambulation. Pt following sequence RW Right left RW. Pt with good recall when asked. pt needs constant v/c for hand placement and safety. Pt leaning to the right with mobility. Pt needs rest breaks and prolonged sitting to recover ADL Comments: Pt agreeable to OOB on arrival. pt with good recall of therapist from previous session. Pt continues to request food and currently with panda without running fluids. Pt progressed to EOB with LOB static sitting to the right. Pt able to initiate neutral posture but unable to sustain. Pt with lean to the right after ~15 -30 seconds of static sitting. Pt demonstrates nystagmus of eyes with  horizontal beating. Pt today with incr nystagmus and reports blurred vision.     OT Diagnosis:    OT Problem List:   OT Treatment Interventions:     OT Goals Acute Rehab OT Goals OT Goal Formulation: With patient Time For Goal Achievement: 12/20/12 Potential to Achieve Goals: Good ADL Goals Pt Will Perform Grooming: with set-up;Sitting, chair;Supported ADL Goal: Grooming - Progress: Progressing toward goals Pt Will Perform Upper Body Bathing: with set-up;Sitting, chair;Supported Pt Will Perform Upper Body Dressing: with set-up;Sitting, chair;Supported Engineer, water to Toilet: with mod assist;Ambulation;3-in-1 ADL Goal: Statistician - Progress: Progressing toward goals Pt Will Perform Toileting - Clothing Manipulation: with mod assist;Sitting on 3-in-1 or toilet ADL Goal: Toileting - Clothing Manipulation - Progress: Progressing toward goals Pt Will Perform Toileting - Hygiene: with mod assist;Sit to stand from 3-in-1/toilet Miscellaneous OT Goals Miscellaneous OT Goal #1: Pt will tolerate smooth pursuit exercises for 5 minutes (rest breaks as needed) occluded vision and conjugated eye movements OT Goal: Miscellaneous Goal #1 - Progress: Goal set today  Visit Information  Last OT Received On: 12/07/12 Assistance Needed: +2 PT/OT Co-Evaluation/Treatment: Yes    Subjective Data      Prior Functioning       Cognition  Cognition Arousal/Alertness: Awake/alert Behavior During Therapy: WFL for tasks assessed/performed Overall Cognitive Status: Within Functional Limits for tasks assessed    Mobility  Bed Mobility Bed Mobility: Supine to Sit Supine to Sit: 4: Min guard;HOB flat Sitting - Scoot to Edge of Bed: 4: Min guard;With rail Sit to Supine: 4: Min guard;HOB flat Details for Bed Mobility Assistance: Pt with LOB in static sitting and using foot board with  Rt UE to support self Transfers Sit to Stand: 4: Min assist;With upper extremity assist;From chair/3-in-1 Stand  to Sit: 4: Min assist;With upper extremity assist;To chair/3-in-1 Details for Transfer Assistance: Pt needs v/c for hand placement and control to descend,. Pt states "thats my problem I go fast"     Exercises  Other Exercises Other Exercises: Pt needs static standing with RW to weight shift prior to progressing.    Balance Static Sitting Balance Static Sitting - Balance Support: Bilateral upper extremity supported;Feet supported Static Sitting - Level of Assistance: 5: Stand by assistance Static Standing Balance Static Standing - Balance Support: Bilateral upper extremity supported;During functional activity Static Standing - Level of Assistance: 4: Min assist;3: Mod assist   End of Session OT - End of Session Activity Tolerance: Patient tolerated treatment well Patient left: in bed;with call bell/phone within reach Nurse Communication: Mobility status;Precautions  GO     Lucile Shutters 12/07/2012, 9:59 AM Pager: [phone]

## 2012-12-07 NOTE — Progress Notes (Signed)
Physical Therapy Treatment Patient Details Name: [name] MRN: [id number] DOB: 01-15-1951 Today's Date: 12/07/2012 Time: 7829-5621 PT Time Calculation (min): 25 min  PT Assessment / Plan / Recommendation Comments on Treatment Session  pt presents with R Cerebellar and Corpus Callosum CVA.  pt very motivated to continue working with therapy, very interested in being able to eat.      Follow Up Recommendations  SNF     Does the patient have the potential to tolerate intense rehabilitation     Barriers to Discharge        Equipment Recommendations  None recommended by PT    Recommendations for Other Services    Frequency Min 4X/week   Plan Discharge plan remains appropriate;Frequency remains appropriate    Precautions / Restrictions Precautions Precautions: Fall Restrictions Weight Bearing Restrictions: No   Pertinent Vitals/Pain Denies pain.      Mobility  Bed Mobility Bed Mobility: Supine to Sit Supine to Sit: 4: Min guard;HOB flat Sitting - Scoot to Edge of Bed: 4: Min guard;With rail Sit to Supine: 4: Min guard;HOB flat Details for Bed Mobility Assistance: Pt with LOB in static sitting and using foot board with Rt UE to support self Transfers Transfers: Sit to Stand;Stand to Sit Sit to Stand: 4: Min assist;With upper extremity assist;From chair/3-in-1 Stand to Sit: 4: Min assist;With upper extremity assist;To chair/3-in-1 Details for Transfer Assistance: Pt needs v/c for hand placement and control to descend,. Pt states "thats my problem I go fast"   pt with R lateral and posterior lean.   Ambulation/Gait Ambulation/Gait Assistance: 1: +2 Total assist Ambulation/Gait: Patient Percentage: 70% Ambulation Distance (Feet): 15 Feet (20, 20, 15) Assistive device: Rolling walker Ambulation/Gait Assistance Details: pt ataxic with R lateral and posterior lean.  Cues for positioning in RW, upright posture, gait sequencing.  pt fatiuges easily and needs seated rest  breaks.  pt tends to rush as he gets closer to chair or bed causing decreased balance.   Gait Pattern: Step-to pattern;Decreased stance time - right Stairs: No Wheelchair Mobility Wheelchair Mobility: No Modified Rankin (Stroke Patients Only) Pre-Morbid Rankin Score: No symptoms Modified Rankin: Moderately severe disability    Exercises Other Exercises Other Exercises: Pt needs static standing with RW to weight shift prior to progressing.    PT Diagnosis:    PT Problem List:   PT Treatment Interventions:     PT Goals Acute Rehab PT Goals Time For Goal Achievement: 12/20/12 Potential to Achieve Goals: Good PT Goal: Supine/Side to Sit - Progress: Progressing toward goal PT Goal: Sit at Edge Of Bed - Progress: Progressing toward goal PT Goal: Sit to Supine/Side - Progress: Progressing toward goal PT Goal: Sit to Stand - Progress: Progressing toward goal PT Goal: Stand to Sit - Progress: Progressing toward goal PT Goal: Stand - Progress: Progressing toward goal PT Goal: Ambulate - Progress: Progressing toward goal  Visit Information  Last PT Received On: 12/07/12 Assistance Needed: +2 PT/OT Co-Evaluation/Treatment: Yes    Subjective Data  Subjective: I want to eat.     Cognition  Cognition Arousal/Alertness: Awake/alert Behavior During Therapy: WFL for tasks assessed/performed Overall Cognitive Status: Within Functional Limits for tasks assessed    Balance  Balance Balance Assessed: Yes Static Sitting Balance Static Sitting - Balance Support: Bilateral upper extremity supported;Feet supported Static Sitting - Level of Assistance: 5: Stand by assistance Static Standing Balance Static Standing - Balance Support: Bilateral upper extremity supported;During functional activity Static Standing - Level of Assistance: 4: Min assist;3:  Mod assist  End of Session PT - End of Session Equipment Utilized During Treatment: Gait belt Activity Tolerance: Patient limited by  fatigue Patient left: in chair;with call bell/phone within reach Nurse Communication: Mobility status   GP     Sunny Schlein, [place] [phone] 12/07/2012, 10:43 AM

## 2012-12-07 NOTE — Progress Notes (Signed)
Patient's right pupil is 2 round and brisk and the left pupil is 3 round and brisk. Confirmed this pupil assessment with Misty Stanley, RN and she agrees with findings.  Otherwise neuro assessment remains unchanged. Dr. Thad Ranger called and made aware of change. Stat CT ordered.  Will continue to monitor. Jacqulynn Cadet

## 2012-12-07 NOTE — Progress Notes (Signed)
Stroke Team Progress Note  HISTORY [name] is an 62 y.o. male who was seen by his family to have acute left sided weakness with slurred speech at 1730 today, 12/05/2012. EMS arrived, SBP~220. Patient complained of concurrent headache. He does have hypertension and did take his medications today. En route EMS states that his symptoms were resolving although his blood pressure remained high. He does remain sleepy but easy to awake. Patient was not initially felt to be a TPA candidate secondary to NIHSS=1. He was admitted for further evaluation and treatment.  Re-evaluation in the ED: MRI shows a right cerebellar and right corpus callossal acute infarcts. MRA shows nonvisualized right vertebral. Repeat examination shows mild right ptosis, right nystagmus, ?mild right facial droop, slurred speech (felt to be baseline), normal symmetric strength and no evidence of dysmetria. Plantars downgoing.  BP on return from MRI is 238/122. 20mg  of Labetolol given with repeat BP of 211/112. Heart rate responding. Patient ordered to start a Cardene drip. SBP to be maintained at 180-190.   SUBJECTIVE No family at bedside.   OBJECTIVE Most recent Vital Signs: Filed Vitals:   12/07/12 0600 12/07/12 0630 12/07/12 0700 12/07/12 0730  BP: 189/91 179/91 198/98 179/101  Pulse: 70 77 74   Temp:    97.8 F (36.6 C)  TempSrc:    Oral  Resp: 25 28 32 21  Height:      Weight:      SpO2: 100% 99% 98%    CBG (last 3)   Recent Labs  12/05/12 2040  GLUCAP 99   IV Fluid Intake:   . niCARDipine Stopped (12/06/12 2300)   MEDICATIONS  . antiseptic oral rinse  15 mL Mouth Rinse q12n4p  . atorvastatin  80 mg Oral q1800  . chlorhexidine  15 mL Mouth Rinse BID  . clopidogrel  75 mg Oral Daily  . enoxaparin (LOVENOX) injection  40 mg Subcutaneous Q24H  . hydrochlorothiazide  25 mg Oral Daily  . lisinopril  10 mg Oral Daily  . pantoprazole (PROTONIX) IV  40 mg Intravenous Q24H  . pneumococcal 23 valent vaccine   0.5 mL Intramuscular Tomorrow-1000  . sodium chloride  3 mL Intravenous Q12H  . tiotropium  18 mcg Inhalation Daily   PRN:  sodium chloride, albuterol, ondansetron, sodium chloride  Diet:     Activity:  up with assistance DVT Prophylaxis:  Lovenox 40 mg sq daily   CLINICALLY SIGNIFICANT STUDIES Basic Metabolic Panel:   Recent Labs Lab 12/06/12 0229 12/06/12 0820 12/07/12 0345  NA 138 134* 138  K 3.8 4.1 3.6  CL 103 101 105  CO2 24 24 26   GLUCOSE 143* 105* 95  BUN 8 8 8   CREATININE 0.71 0.69 0.75  CALCIUM 9.7 9.9 9.7  MG 1.7  --  2.0  PHOS 3.2  --  2.9   Liver Function Tests:   Recent Labs Lab 12/05/12 1851  AST 40*  ALT 36  ALKPHOS 87  BILITOT 0.3  PROT 8.3  ALBUMIN 3.2*   CBC:   Recent Labs Lab 12/05/12 1851 12/05/12 1855 12/06/12 0820  WBC 9.4  --  10.4  NEUTROABS 3.2  --  7.1  HGB 13.3 13.9 14.1  HCT 37.4* 41.0 39.1  MCV 83.5  --  84.1  PLT 275  --  279   Coagulation:   Recent Labs Lab 12/05/12 1851  LABPROT 13.0  INR 0.99   Cardiac Enzymes:   Recent Labs Lab 12/05/12 2128 12/06/12 0229 12/06/12 0820  TROPONINI <0.30 <0.30 <0.30   Urinalysis:   Recent Labs Lab 12/05/12 2207  COLORURINE YELLOW  LABSPEC 1.016  PHURINE 5.5  GLUCOSEU NEGATIVE  HGBUR MODERATE*  BILIRUBINUR NEGATIVE  KETONESUR NEGATIVE  PROTEINUR NEGATIVE  UROBILINOGEN 0.2  NITRITE NEGATIVE  LEUKOCYTESUR NEGATIVE   Lipid Panel    Component Value Date/Time   CHOL 146 12/06/2012 0820   TRIG 103 12/06/2012 0820   HDL 47 12/06/2012 0820   CHOLHDL 3.1 12/06/2012 0820   VLDL 21 12/06/2012 0820   LDLCALC 78 12/06/2012 0820   HgbA1C  Lab Results  Component Value Date   HGBA1C 5.5 12/05/2012    Urine Drug Screen:     Component Value Date/Time   LABOPIA NONE DETECTED 12/05/2012 2207   LABOPIA NEGATIVE 10/14/2010 2140   COCAINSCRNUR NONE DETECTED 12/05/2012 2207   COCAINSCRNUR  Value: POSITIVE (NOTE) Result repeated and verified. Sent for confirmatory testing*  10/14/2010 2140   LABBENZ NONE DETECTED 12/05/2012 2207   LABBENZ NEGATIVE 10/14/2010 2140   AMPHETMU NONE DETECTED 12/05/2012 2207   AMPHETMU NEGATIVE 10/14/2010 2140   THCU NONE DETECTED 12/05/2012 2207   LABBARB NONE DETECTED 12/05/2012 2207    Alcohol Level:   Recent Labs Lab 12/05/12 1851  ETH 81*   CT of the brain   12/07/2012 Stable appearance of the areas of focal ischemic change in the genu of the corpus callosum on the right and in the right inferior cerebellum. Mild sulci effacement without midline shift. No evidence of acute intracranial hemorrhage. 12/05/2012  Atrophy and chronic ischemic white matter disease without acute intracranial abnormality.  Chronic left paranasal sinus disease.   MRI of the brain  12/05/2012   Acute or subacute abnormality at the genu of the corpus callosum more to the right of midline.  This is most consistent with infarction.  The differential diagnosis does include tumor, but that is felt considerably less likely.  Probable acute infarction at the inferior cerebellum on the right. Loss of flow in the right vertebral artery since the previous study.  Extensive chronic small vessel disease elsewhere throughout the brain.  Old cortical and subcortical infarction at the vertex on the right.    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 Only a small amount of retrograde flow in the distal part of that vessel.  The left vertebral artery remains widely patent to the basilar.  Marked progression of atherosclerotic disease throughout the anterior and middle cerebral branches bilaterally, worse on the left than the right.      2D Echocardiogram    Carotid Doppler  No significant extracranial carotid artery stenosis demonstrated. Vertebrals are patent with antegrade flow.  TCD  CXR  12/05/2012 Suboptimal inspiration. No acute cardiopulmonary disease.   EKG  normal sinus rhythm, RBBB, 1st degree AV block.   Therapy  Recommendations SNF  Physical Exam   Elderly african [place] male not in distress.Awake alert. Afebrile. Head is nontraumatic. Neck is supple without bruit. Hearing is normal. Cardiac exam no murmur or gallop. Lungs are clear to auscultation. Distal pulses are well felt. Neurological Exam ; awake alert oriented x3. Moderate dysarthria but can be understood. Eyes and primary position extraocular movements are full range with nystagmus on lateral gaze right more than left. Pupils are equal reactive. Fundi were not visualized. There is severe right lower face weakness. Tongue is midline. Cough and gag aren't week. No upper or lower eczema to drift. Diminished fine finger  movements on the left. Finger-to-nose dysmetria on the right upper extremity. No focal weakness. Touch and pinprick sensation are preserved. Deep tendon pulses are 2+ symmetric. Plantars are downgoing.  ASSESSMENT [name] is a 62 y.o. male presenting with left sided weakness and slurred speech. Imaging confirms a right  genu of the corpus callosum, right inferior cerebellum infarct. Also, nonvisualized right vertebral artery. Etiology of Infarct intracranial atherosclerosis likely.. On clopidogrel 75 mg orally every day prior to admission. Now on clopidogrel 75 mg orally every day for secondary stroke prevention. Patient with resultant right facial weakness, right ptosis.  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 Patient is allergic to aspirin.   ST following swallow  F/u 2D echo  CIR consult pending  SBP goal < 100, DBP < 110  Ok to transfer to the floor from neuro standpoing  Annie Main, MSN, RN, ANVP-BC, ANP-BC, [facility] Pager:  [phone] 12/07/2012 8:22 AM  I have personally obtained a history, examined the patient, evaluated imaging results, and formulated the assessment and plan of care. I agree with the above.  Delia Heady, MD

## 2012-12-07 NOTE — Progress Notes (Signed)
Speech Language Pathology Dysphagia Treatment Patient Details Name: [name] MRN: [id number] DOB: 1950/12/25 Today's Date: 12/07/2012 Time: 0865-7846 SLP Time Calculation (min): 24 min  Assessment / Plan / Recommendation Clinical Impression  F/u after yesterday's MBS.  Pt now with NG; using oral suctioning independently secondary to poor secretion management.  Pt with rhonchi bilaterally; consistent congestion, audible and palpable at level of larynx.  Reviewed results/recs from yesterday's study; pt verbalized understanding.  Provided instruction to carry out two exercises addressing pharyngeal/submental musc strengthening.  Pt able to execute with mod assist.  Provided with limited ice chips - no change in clinical symptoms from yesterday.  Severe dysphagia persists.  Rec continued SLP for dysphagia tx and to assess readiness for POs.      Diet Recommendation  Continue with Current Diet: NPO    SLP Plan Continue with current plan of care   Pertinent Vitals/Pain No pain   Swallowing Goals  SLP Swallowing Goals Goal #3: Pt will execute effortful swallow five times per session with max assist Swallow Study Goal #3 - Progress: Revised (modified due to lack of progress/goal met) (supervision goal) Goal #4: Pt will execute pharyngeal strengthening exercises with min cues 10x/session Swallow Study Goal #4 - Progress: Progressing toward goal  General Temperature Spikes Noted: No Respiratory Status: Supplemental O2 delivered via (comment) (nasal cannula 2l) Behavior/Cognition: Alert;Cooperative;Pleasant mood Patient Positioning: Upright in bed  Oral Cavity - Oral Hygiene Does patient have any of the following "at risk" factors?: Nutritional status - dependent feeder;Other - dysphagia;Oxygen therapy - cannula, mask, simple oxygen devices Patient is HIGH RISK - Oral Care Protocol followed (see row info): Yes   Dysphagia Treatment Treatment focused on: Patient/family/caregiver  education;Facilitation of pharyngeal phase;Other (comment) (therapeutic exercises) Treatment Methods/Modalities: Masako Maneuver;Shaker exercise (modified shaker - towel tucked under chin) Patient observed directly with PO's: Yes Type of PO's observed: Ice chips Feeding: Able to feed self Pharyngeal Phase Signs & Symptoms: Suspected delayed swallow initiation;Wet vocal quality;Immediate cough Type of cueing: Verbal Amount of cueing: Moderate   GO     [name] [name] 12/07/2012, 4:50 PM

## 2012-12-08 DIAGNOSIS — I10 Essential (primary) hypertension: Secondary | ICD-10-CM

## 2012-12-08 HISTORY — PX: PEG TUBE PLACEMENT: SUR1034

## 2012-12-08 LAB — BASIC METABOLIC PANEL
GFR calc Af Amer: 87 mL/min — ABNORMAL LOW (ref >90)
GFR calc non Af Amer: 75 mL/min — ABNORMAL LOW (ref >90)
Potassium: 3.4 mEq/L — ABNORMAL LOW (ref 3.5–5.1)
Sodium: 139 mEq/L (ref 135–145)

## 2012-12-08 LAB — MAGNESIUM: Magnesium: 2.2 mg/dL (ref 1.5–2.5)

## 2012-12-08 MED ORDER — POTASSIUM CHLORIDE 20 MEQ/15ML (10%) PO LIQD
40.0000 meq | Freq: Two times a day (BID) | ORAL | Status: DC
  Administered 2012-12-08 (×2): 40 meq via ORAL
  Filled 2012-12-08 (×4): qty 30

## 2012-12-08 NOTE — Progress Notes (Signed)
[facility] PROGRESS NOTE Assessment/Plan: Hypertensive emergency/ Malignant hypertension - started on admission on nicardipine drip titrated off. - currently on lisinopril and HCTZ - BP <160/90  Stroke, acute, embolic - MRI 4.28.2014: Acute or subacute abnormality at the genu of the corpus callosum more to the right of midline. T - neurology consulted. Recommended plavix. - failed swallowing evaluation. - CIR consult pending - ECHO 4.29.2014: estimated ejection fraction was in the range of 65% to 70% - carotid doppler 4.29.2014: showed no ICA stenosis. - statins.   Dyslipidemia - statins    Alcohol abuse: - thiamine and folate. - moniotr with CIWA.   Hypokalemia - replete check a mag   Smokes tobacco daily   Code Status: full Family Communication: none  Disposition Plan: inpatient 2-3 days   Consultants:  neuro  Procedures: 4/28 Head CT >>>Atrophy and chronic ischemic white matter disease without acute<BR>intracranial abnormality. Chronic left paranasal sinus disease  4/28 Brain MRI >>> acute right cerebellar and right corpus callosum infarcts   Antibiotics:  HPI/Subjective: He relates he wants to eat.  Objective: Filed Vitals:   12/07/12 2207 12/08/12 0109 12/08/12 0621 12/08/12 1008  BP: 152/81 154/89 144/85 135/69  Pulse: 99 91 83 80  Temp: 99.1 F (37.3 C) 98.4 F (36.9 C) 98.2 F (36.8 C) 98.2 F (36.8 C)  TempSrc: Oral Oral Oral Oral  Resp: 20 18 18 18   Height:      Weight:   75.3 kg (166 lb 0.1 oz)   SpO2: 98% 97% 94% 96%    Intake/Output Summary (Last 24 hours) at 12/08/12 1238 Last data filed at 12/07/12 2212  Gross per 24 hour  Intake    400 ml  Output    450 ml  Net    -50 ml   Filed Weights   12/06/12 0145 12/08/12 0621  Weight: 78.2 kg (172 lb 6.4 oz) 75.3 kg (166 lb 0.1 oz)    Exam:  General: Alert, awake, oriented x3, in no acute distress.  HEENT: No bruits, no goiter.  Heart: Regular rate and rhythm, without  murmurs, rubs, gallops.  Lungs: Good air movement, bilateral air movement.  Abdomen: Soft, nontender, nondistended, positive bowel sounds.  Neuro: Grossly intact, nonfocal.   Data Reviewed: Basic Metabolic Panel:  Recent Labs Lab 12/05/12 1851 12/05/12 1855 12/06/12 0229 12/06/12 0820 12/07/12 0345 12/08/12 0525  NA 139 143 138 134* 138 139  K 3.1* 3.1* 3.8 4.1 3.6 3.4*  CL 105 108 103 101 105 103  CO2 21  --  24 24 26 25   GLUCOSE 123* 126* 143* 105* 95 112*  BUN 8 7 8 8 8 17   CREATININE 0.96 1.20 0.71 0.69 0.75 1.03  CALCIUM 9.7  --  9.7 9.9 9.7 9.9  MG  --   --  1.7  --  2.0  --   PHOS  --   --  3.2  --  2.9  --    Liver Function Tests:  Recent Labs Lab 12/05/12 1851  AST 40*  ALT 36  ALKPHOS 87  BILITOT 0.3  PROT 8.3  ALBUMIN 3.2*   No results found for this basename: LIPASE, AMYLASE,  in the last 168 hours No results found for this basename: AMMONIA,  in the last 168 hours CBC:  Recent Labs Lab 12/05/12 1851 12/05/12 1855 12/06/12 0820  WBC 9.4  --  10.4  NEUTROABS 3.2  --  7.1  HGB 13.3 13.9 14.1  HCT 37.4* 41.0 39.1  MCV 83.5  --  84.1  PLT 275  --  279   Cardiac Enzymes:  Recent Labs Lab 12/05/12 1851 12/05/12 2128 12/06/12 0229 12/06/12 0820  TROPONINI <0.30 <0.30 <0.30 <0.30   BNP (last 3 results) No results found for this basename: PROBNP,  in the last 8760 hours CBG:  Recent Labs Lab 12/05/12 2040 12/07/12 2210  GLUCAP 99 98    Recent Results (from the past 240 hour(s))  MRSA PCR SCREENING     Status: None   Collection Time    12/06/12  1:38 AM      Result Value Range Status   MRSA by PCR NEGATIVE  NEGATIVE Final   Comment:            The GeneXpert MRSA Assay (FDA     approved for NASAL specimens     only), is one component of a     comprehensive MRSA colonization     surveillance program. It is not     intended to diagnose MRSA     infection nor to guide or     monitor treatment for     MRSA infections.      Studies: Ct Head Wo Contrast  12/07/2012  *RADIOLOGY REPORT*  Clinical Data: History of cerebellar infarct with development of unequal pupils.  CT HEAD WITHOUT CONTRAST  Technique:  Contiguous axial images were obtained from the base of the skull through the vertex without contrast.  Comparison: MRI brain 12/05/2012.  CT head 12/05/2012.  Findings: There is low attenuation change in the region of the genu of the corpus callosum corresponding to the area of acute infarct seen on the MRI examination.  Low attenuation change in the right inferior cerebellum consistent with infarct, also demonstrated on prior MRI.  There is cerebellar sulci effacement.  The fourth ventricle and basal cisterns remain without effacement.  Mild cerebellar tonsillar ectopia.  Chronic-appearing cerebral atrophy. Low attenuation changes in the deep white matter consistent with small vessel ischemia.  No ventricular dilatation.  No midline shift.  No abnormal extra-axial fluid collections.  No acute intracranial hemorrhage.  Opacification of the left maxillary antrum again demonstrated.  IMPRESSION: Stable appearance of the areas of focal ischemic change in the genu of the corpus callosum on the right and in the right inferior cerebellum.  Mild sulci effacement without midline shift.  No evidence of acute intracranial hemorrhage.   Original Report Authenticated By: Burman Nieves, M.D.    Dg Abd Portable 1v  12/06/2012  *RADIOLOGY REPORT*  Clinical Data: Feeding tube placement.  PORTABLE ABDOMEN - 1 VIEW  Comparison: None.  Findings: Weighted tip feeding tube is present with the tip in the mid gastric fundus.  Small amount of oral contrast is present opacifying the folds of the stomach proximally.  The atherosclerosis noted.  Bowel gas pattern appears normal.  IMPRESSION: Feeding tube tip in the gastric fundus.   Original Report Authenticated By: Andreas Newport, M.D.     Scheduled Meds: . antiseptic oral rinse  15 mL Mouth Rinse  q12n4p  . atorvastatin  80 mg Oral q1800  . chlorhexidine  15 mL Mouth Rinse BID  . clopidogrel  75 mg Oral Daily  . enoxaparin (LOVENOX) injection  40 mg Subcutaneous Q24H  . free water  160 mL Per Tube Q6H  . hydrochlorothiazide  25 mg Oral Daily  . lisinopril  10 mg Oral Daily  . pantoprazole (PROTONIX) IV  40 mg Intravenous Q24H  . sodium chloride  3 mL Intravenous Q12H  .  tiotropium  18 mcg Inhalation Daily   Continuous Infusions: . feeding supplement (JEVITY 1.2 CAL) 1,000 mL (12/07/12 1440)     Marinda Elk  [facility] Pager [phone]. If 8PM-8AM, please contact night-coverage at [email or web address], password [facility] 12/08/2012, 12:38 PM  LOS: 3 days

## 2012-12-08 NOTE — H&P (Signed)
Physical Medicine and Rehabilitation Admission H&P    Chief Complaint  Patient presents with  . Left sided weakness, slurred speech, problems walking.  : HPI:  [name] is a 62 y.o. RH-male with history of HTN, COPD, prior CVA; who was admitted on 12/05/12 with acute onset of left hand weakness, unsteady gait and slurred speech. Noted to have elevated SBP-220. Patient reported falls x 3 and dizziness earlier that day. 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 Work up initiated and ST evaluation reveals signs of moderate to severe dysphagia with difficulty handling oral secretions therefore NPO recommended with alternative means of nutrition for now. Panda placed but patient continues to ask for po's.Carotid dopplers without ICA stenosis.  2D echo done revealing EF 65-70% with grade 1 diastolic dysfunction. PT/OT evaluations done revealing patient with poor spacial awareness with decreased vision, ataxia and severe balance deficits. MD recommending CIR   Review of Systems  HENT: Negative for hearing loss.   Eyes: Negative for double vision.  Respiratory: Positive for cough. Negative for shortness of breath.   Cardiovascular: Negative for chest pain and palpitations.  Gastrointestinal: Negative for heartburn, nausea and vomiting.       Hungry.  Genitourinary: Negative for urgency and frequency.  Musculoskeletal: Negative for myalgias and back pain.  Neurological: Positive for speech change and focal weakness. Negative for headaches.  Psychiatric/Behavioral: The patient has insomnia (due to multiple medical interventions. ).    Past Medical History  Diagnosis Date  . Hypertension 05/18/2007  . COPD 05/18/2007  . Elevated PSA 05/25/2007  . 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 Stroke 12/05/2012   Past Surgical History  Procedure Laterality Date  . 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 Liver disease Mother   . Heart attack Mother 23   Social History: Lives in a boarding house--reports friends there can provide some help past discharge. Used work put down carpet.--retired/disabled since 2012. Independent PTA. He reports that he has been smoking 3 Cigarettes/day. He has been smoking about 0.33 packs per day. He has never used smokeless tobacco. He reports that he drinks about 1/2 pint liquor daily. He reports that he does not use illicit drugs.    Allergies  Allergen Reactions  . Aspirin Hives  . Penicillins Hives   Medications Prior to Admission  Medication Sig Dispense Refill  . albuterol (PROVENTIL HFA;VENTOLIN HFA) 108 (90 BASE) MCG/ACT inhaler Inhale 2 puffs into the lungs every 6 (six) hours as needed for wheezing.  8.5 g  11  . clopidogrel (PLAVIX) 75 MG tablet Take 1 tablet (75 mg total) by mouth daily.  30 tablet  11  . hydrochlorothiazide (HYDRODIURIL) 25 MG tablet Take 1 tablet (25 mg total) by mouth daily.      Marland Kitchen lisinopril (PRINIVIL,ZESTRIL) 10 MG tablet Take 1 tablet (10 mg total) by mouth daily.  30 tablet  11  . tiotropium (SPIRIVA HANDIHALER) 18 MCG inhalation capsule Place 1 capsule (18 mcg total) into inhaler and inhale daily.  30 capsule  11  . [DISCONTINUED] pravastatin (PRAVACHOL) 80 MG tablet Take 1 tablet (80 mg total) by mouth daily.  30 tablet  11    Home: Home Living Lives With: Alone Type of Home: Other (Comment) (boarding house) Home Access: Level entry Home Layout: One level Bathroom Shower/Tub: Walk-in shower;Door [employer]  Toilet: Standard Home Adaptive Equipment: None   Functional History: Prior Function Able to Take Stairs?: No Driving: No Vocation: Unemployed Comments: take the bus or walk  Functional Status:  Mobility: Bed Mobility Bed Mobility: Supine to Sit Supine to Sit: 4: Min guard Sitting - Scoot to Edge of Bed: 4: Min guard;With rail Sit to Supine: 4: Min  guard;HOB flat Transfers Transfers: Sit to Stand;Stand to Sit Sit to Stand: 4: Min assist (v/c to activate lower extremities. facilitate hip extension) Stand to Sit: 3: Mod assist (once initiated, minimal pt control of descent) Ambulation/Gait Ambulation/Gait Assistance: 1: +2 Total assist Ambulation/Gait: Patient Percentage: 70% Ambulation Distance (Feet): 20 Feet Assistive device: Rolling walker Ambulation/Gait Assistance Details: Pt very reliant on walker and lacking weight shift with ambulation.  R knee hyperextension in stance despite adequate strength in quads.  V/c needed to slow down pt. Gait Pattern: Step-to pattern;Decreased stride length;Decreased weight shift to left;Decreased weight shift to right Stairs: No Wheelchair Mobility Wheelchair Mobility: No  ADL: ADL Eating/Feeding: NPO (requesting food) Where Assessed - Eating/Feeding: Chair Grooming: Wash/dry face;Set up Where Assessed - Grooming: Supported sitting Toilet Transfer: Minimal assistance Statistician Method: Sit to Barista: Raised toilet seat with arms (or 3-in-1 over toilet) Equipment Used: Gait belt;Rolling walker Transfers/Ambulation Related to ADLs: Pt demonstrates deficits with somatosensory (toe lifting off floor, reports "kinda feel it" when asked if he can feel his feet on the floor, and supination of  foot) Pt scissoring with ambulation. Pt following sequence RW Right left RW. Pt with good recall when asked. pt needs constant v/c for hand placement and safety. Pt leaning to the right with mobility. Pt needs rest breaks and prolonged sitting to recover ADL Comments: Pt agreeable to OOB on arrival. pt with good recall of therapist from previous session. Pt continues to request food and currently with panda without running fluids. Pt progressed to EOB with LOB static sitting to the right. Pt able to initiate neutral posture but unable to sustain. Pt with lean to the right after ~15 -30  seconds of static sitting. Pt demonstrates nystagmus of eyes with horizontal beating. Pt today with incr nystagmus and reports blurred vision.   Cognition: Cognition Overall Cognitive Status: Within Functional Limits for tasks assessed Arousal/Alertness: Awake/alert Orientation Level: Oriented X4 Cognition Arousal/Alertness: Awake/alert Behavior During Therapy: Impulsive Overall Cognitive Status: Within Functional Limits for tasks assessed  Physical Exam: Blood pressure 123/80, pulse 89, temperature 97.8 F (36.6 C), temperature source Oral, resp. rate 18, height 5\' 8"  (1.727 m), weight 76.703 kg (169 lb 1.6 oz), SpO2 98.00%.  Physical Exam  Nursing note and vitals reviewed. Constitutional: He is oriented to person, place, and time. He appears well-developed and well-nourished.  HENT:  Head: Normocephalic and atraumatic.  Right Ear: External ear normal.  Left Ear: External ear normal.  Eyes: Conjunctivae and EOM are normal. Pupils are equal, round, and reactive to light.  Neck: Normal range of motion. No JVD present. No tracheal deviation present. No thyromegaly present.  Cardiovascular: Normal rate and regular rhythm.  Exam reveals no friction rub.   No murmur heard. Pulmonary/Chest: Effort normal. No respiratory distress. He has no wheezes. He has rhonchi in the right upper field and the left upper field. He has no rales.  Multiple upper airway sounds due to oral secretions.   Abdominal: Soft. Bowel sounds are normal. He exhibits no distension. There is no tenderness.  Musculoskeletal: He exhibits no edema.  Large nontender mass under left patella.  Does not affect ROM.  Neurological: He is alert and oriented to person, place, and time.  Wet voice with copious secretions, gurgling sounds with occasional coughing.  Soft voice. Right facial paresis with ptosis. Absent gag reflex. Poor insight with impaired awareness.  RLE>RUE weakness weakness.   Skin: Skin is warm and dry.     Results for orders placed during the hospital encounter of 12/05/12 (from the past 48 hour(s))  GLUCOSE, CAPILLARY     Status: None   Collection Time    12/07/12 10:10 PM      Result Value Range   Glucose-Capillary 98  70 - 99 mg/dL   Comment 1 Notify RN    BASIC METABOLIC PANEL     Status: Abnormal   Collection Time    12/08/12  5:25 AM      Result Value Range   Sodium 139  135 - 145 mEq/L   Potassium 3.4 (*) 3.5 - 5.1 mEq/L   Chloride 103  96 - 112 mEq/L   CO2 25  19 - 32 mEq/L   Glucose, Bld 112 (*) 70 - 99 mg/dL   BUN 17  6 - 23 mg/dL   Creatinine, Ser 1.19  0.50 - 1.35 mg/dL   Calcium 9.9  8.4 - 14.7 mg/dL   GFR calc non Af Amer 75 (*) >90 mL/min   GFR calc Af Amer 87 (*) >90 mL/min   Comment:            The eGFR has been calculated     using the CKD EPI equation.     This calculation has not been     validated in all clinical     situations.     eGFR's persistently     <90 mL/min signify     possible Chronic Kidney Disease.  MAGNESIUM     Status: None   Collection Time    12/08/12  5:25 AM      Result Value Range   Magnesium 2.2  1.5 - 2.5 mg/dL  GLUCOSE, CAPILLARY     Status: Abnormal   Collection Time    12/08/12  8:45 PM      Result Value Range   Glucose-Capillary 107 (*) 70 - 99 mg/dL   Comment 1 Notify RN    GLUCOSE, CAPILLARY     Status: Abnormal   Collection Time    12/09/12  1:09 AM      Result Value Range   Glucose-Capillary 109 (*) 70 - 99 mg/dL   Comment 1 Notify RN    GLUCOSE, CAPILLARY     Status: Abnormal   Collection Time    12/09/12  5:03 AM      Result Value Range   Glucose-Capillary 123 (*) 70 - 99 mg/dL   Comment 1 Notify RN    BASIC METABOLIC PANEL     Status: Abnormal   Collection Time    12/09/12  6:00 AM      Result Value Range   Sodium 138  135 - 145 mEq/L   Potassium 3.8  3.5 - 5.1 mEq/L   Chloride 103  96 - 112 mEq/L   CO2 26  19 - 32 mEq/L   Glucose, Bld 118 (*) 70 - 99 mg/dL   BUN 26 (*) 6 - 23 mg/dL   Creatinine,  Ser 8.29  0.50 - 1.35 mg/dL   Calcium 56.2  8.4 - 13.0 mg/dL   GFR calc non Af Amer 63 (*) >90 mL/min  GFR calc Af Amer 73 (*) >90 mL/min   Comment:            The eGFR has been calculated     using the CKD EPI equation.     This calculation has not been     validated in all clinical     situations.     eGFR's persistently     <90 mL/min signify     possible Chronic Kidney Disease.  GLUCOSE, CAPILLARY     Status: Abnormal   Collection Time    12/09/12 11:27 AM      Result Value Range   Glucose-Capillary 102 (*) 70 - 99 mg/dL      Post Admission Physician Evaluation: 1. 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 2. Patient is admitted to receive collaborative, interdisciplinary care between the physiatrist, rehab nursing staff, and therapy team. 3. Patient's level of medical complexity and substantial therapy needs in context of that medical necessity cannot be provided at a lesser intensity of care such as a SNF. 4. Patient has experienced substantial functional loss from his/her baseline which was documented above under the "Functional History" and "Functional Status" headings.  Judging by the patient's diagnosis, physical exam, and functional history, the patient has potential for functional progress which will result in measurable gains while on inpatient rehab.  These gains will be of substantial and practical use upon discharge  in facilitating mobility and self-care at the household level. 5. Physiatrist will provide 24 hour management of medical needs as well as oversight of the therapy plan/treatment and provide guidance as appropriate regarding the interaction of the two. 6. 24 hour rehab nursing will assist with bladder management, bowel management, safety, skin/wound care, disease management, medication administration and patient education  and help integrate therapy concepts, techniques,education, etc. 7. PT will assess and  treat for/with: Lower extremity strength, range of motion, stamina, balance, functional mobility, safety, adaptive techniques and equipment, NMR, vestibular assessment, coordination.   Goals are: supervision. 8. OT will assess and treat for/with: ADL's, functional mobility, safety, upper extremity strength, adaptive techniques and equipment, NMR, vestibular dysfunction and treatment.   Goals are: supervision to min assist. 9. SLP will assess and treat for/with: swallowing, speech, communication.  Goals are: min to mod assist. 10. Case Management and Social Worker will assess and treat for psychological issues and discharge planning. 11. Team conference will be held weekly to assess progress toward goals and to determine barriers to discharge. 12. Patient will receive at least 3 hours of therapy per day at least 5 days per week. 13. ELOS: 2wk (?lessen the burden of care upon next venue)      Prognosis:  good   Medical Problem List and Plan: 1. DVT Prophylaxis/Anticoagulation: Pharmaceutical: Lovenox 2. Pain Management: N/A 3. Mood:  Will have LCSW follow for evaluation.  4. Neuropsych: This patient is not capable of making decisions on his/her own behalf. 5. HTN: Monitor with bid checks. Continue HCTZ and Prinivil for now. May need to re-evaluate diuretic in face of modified diet and acute renal insufficiency.  6. COPD: Continue spiriva. Prn nebs for SOB. Keep HOB elevated at 30 degrees. Encourage IS.  7. Dysphagia: Continue NPO due to severe dysphagia.  TF for nutritional support.  Continue to educate on aspiration risk. Replace NGT. May need to increase FT rate. Check CXR to rule out aspiration. . Will change BS to qid to help patient get rest and as BS 98-123 range.  SSI for elevated BS.   Ranelle Oyster, MD, [place] [facility]  12/09/2012

## 2012-12-08 NOTE — Progress Notes (Signed)
I have read and agree with the below note and plan. Chanson Teems Helen Whitlow PT, DPT Pager: [phone] 

## 2012-12-08 NOTE — Progress Notes (Signed)
Rehab admissions - Evaluated for possible admission.  Noted Pt and OT recommending SNF since patient has poor caregiver support and will need prolonged recovery time.  I called and left a message with emergency contact number.  Patient lived alone in a boarding house PTA.  I will need to speak with family or someone to determine caregiver support after discharge.  Will follow progress for now.  Call me for questions.  #[phone]

## 2012-12-08 NOTE — Progress Notes (Signed)
NUTRITION FOLLOW UP  Intervention:   Continue Jevity 1.2 @ 65 ml/hr with 160 ml H2O flush every 6 hours.   Nutrition Dx:   Inadequate oral intake related to inability to eat as evidenced by NPO status; ongoing.   Goal:  Pt to meet >/= 90% of their estimated nutrition needs; met.   Monitor:  TF tolerance, labs, weight   Assessment:   Pt admitted with right cerebellar and right corpus callossal acute infarcts. Pt with malignant HTN. Pt discussed during rounds with MD and RN. Long term nutrition plan to be established prior to d/c.  Pt with NG tube in place with tip in the gastric fundus.  Jevity 1.2 is infusing @ 65 ml/hr. Tube feeding regimen currently providing 1872 kcal, 86 grams protein, and 1263 ml H2O.   Free water flushes: 160 ml every 6 hrs providing 640 of free water.  Total free water: 1903 ml per day.  Residuals: 10 ml   Height: Ht Readings from Last 1 Encounters:  12/06/12 5\' 8"  (1.727 m)    Weight Status:   Wt Readings from Last 1 Encounters:  12/08/12 166 lb 0.1 oz (75.3 kg)  Admission weight: 172 lb  Re-estimated needs:  Kcal: 1850-2050  Protein: 85-100 grams  Fluid: > 1.9 L/day  Skin: no issues noted  Diet Order: NPO   Intake/Output Summary (Last 24 hours) at 12/08/12 0931 Last data filed at 12/07/12 2212  Gross per 24 hour  Intake    563 ml  Output    450 ml  Net    113 ml    Last BM: 4/28   Labs:   Recent Labs Lab 12/05/12 1855 12/06/12 0229 12/06/12 0820 12/07/12 0345 12/08/12 0525  NA 143 138 134* 138 139  K 3.1* 3.8 4.1 3.6 3.4*  CL 108 103 101 105 103  CO2  --  24 24 26 25   BUN 7 8 8 8 17   CREATININE 1.20 0.71 0.69 0.75 1.03  CALCIUM  --  9.7 9.9 9.7 9.9  MG  --  1.7  --  2.0  --   PHOS  --  3.2  --  2.9  --   GLUCOSE 126* 143* 105* 95 112*    CBG (last 3)   Recent Labs  12/05/12 2040 12/07/12 2210  GLUCAP 99 98    Scheduled Meds: . antiseptic oral rinse  15 mL Mouth Rinse q12n4p  . atorvastatin  80 mg Oral  q1800  . chlorhexidine  15 mL Mouth Rinse BID  . clopidogrel  75 mg Oral Daily  . enoxaparin (LOVENOX) injection  40 mg Subcutaneous Q24H  . free water  160 mL Per Tube Q6H  . hydrochlorothiazide  25 mg Oral Daily  . lisinopril  10 mg Oral Daily  . pantoprazole (PROTONIX) IV  40 mg Intravenous Q24H  . sodium chloride  3 mL Intravenous Q12H  . tiotropium  18 mcg Inhalation Daily    Continuous Infusions: . feeding supplement (JEVITY 1.2 CAL) 1,000 mL (12/07/12 1440)    Kendell Bane RD, LDN, CNSC [phone] Pager [phone] After Hours Pager

## 2012-12-08 NOTE — Progress Notes (Signed)
Stroke Team Progress Note  HISTORY [name] is an 62 y.o. male who was seen by his family to have acute left sided weakness with slurred speech at 1730 today, 12/05/2012. EMS arrived, SBP~220. Patient complained of concurrent headache. He does have hypertension and did take his medications today. En route EMS states that his symptoms were resolving although his blood pressure remained high. He does remain sleepy but easy to awake. Patient was not initially felt to be a TPA candidate secondary to NIHSS=1. He was admitted for further evaluation and treatment.  Re-evaluation in the ED: MRI shows a right cerebellar and right corpus callossal acute infarcts. MRA shows nonvisualized right vertebral. Repeat examination shows mild right ptosis, right nystagmus, ?mild right facial droop, slurred speech (felt to be baseline), normal symmetric strength and no evidence of dysmetria. Plantars downgoing.  BP on return from MRI is 238/122. 20mg  of Labetolol given with repeat BP of 211/112. Heart rate responding. Patient ordered to start a Cardene drip. SBP to be maintained at 180-190.   SUBJECTIVE No family at bedside. No obvious distress. No complaints.  OBJECTIVE Most recent Vital Signs: Filed Vitals:   12/07/12 2207 12/08/12 0109 12/08/12 0621 12/08/12 1008  BP: 152/81 154/89 144/85 135/69  Pulse: 99 91 83 80  Temp: 99.1 F (37.3 C) 98.4 F (36.9 C) 98.2 F (36.8 C) 98.2 F (36.8 C)  TempSrc: Oral Oral Oral Oral  Resp: 20 18 18 18   Height:      Weight:   75.3 kg (166 lb 0.1 oz)   SpO2: 98% 97% 94% 96%   CBG (last 3)   Recent Labs  12/05/12 2040 12/07/12 2210  GLUCAP 99 98   IV Fluid Intake:   . feeding supplement (JEVITY 1.2 CAL) 1,000 mL (12/07/12 1440)   MEDICATIONS  . antiseptic oral rinse  15 mL Mouth Rinse q12n4p  . atorvastatin  80 mg Oral q1800  . chlorhexidine  15 mL Mouth Rinse BID  . clopidogrel  75 mg Oral Daily  . enoxaparin (LOVENOX) injection  40 mg Subcutaneous  Q24H  . free water  160 mL Per Tube Q6H  . hydrochlorothiazide  25 mg Oral Daily  . lisinopril  10 mg Oral Daily  . pantoprazole (PROTONIX) IV  40 mg Intravenous Q24H  . sodium chloride  3 mL Intravenous Q12H  . tiotropium  18 mcg Inhalation Daily   PRN:  sodium chloride, albuterol, hydrALAZINE, ondansetron, sodium chloride  Diet:  NPO  Activity:  up with assistance DVT Prophylaxis:  Lovenox 40 mg sq daily   CLINICALLY SIGNIFICANT STUDIES Basic Metabolic Panel:   Recent Labs Lab 12/06/12 0229  12/07/12 0345 12/08/12 0525  NA 138  < > 138 139  K 3.8  < > 3.6 3.4*  CL 103  < > 105 103  CO2 24  < > 26 25  GLUCOSE 143*  < > 95 112*  BUN 8  < > 8 17  CREATININE 0.71  < > 0.75 1.03  CALCIUM 9.7  < > 9.7 9.9  MG 1.7  --  2.0  --   PHOS 3.2  --  2.9  --   < > = values in this interval not displayed. Liver Function Tests:   Recent Labs Lab 12/05/12 1851  AST 40*  ALT 36  ALKPHOS 87  BILITOT 0.3  PROT 8.3  ALBUMIN 3.2*   CBC:   Recent Labs Lab 12/05/12 1851 12/05/12 1855 12/06/12 0820  WBC 9.4  --  10.4  NEUTROABS 3.2  --  7.1  HGB 13.3 13.9 14.1  HCT 37.4* 41.0 39.1  MCV 83.5  --  84.1  PLT 275  --  279   Coagulation:   Recent Labs Lab 12/05/12 1851  LABPROT 13.0  INR 0.99   Cardiac Enzymes:   Recent Labs Lab 12/05/12 2128 12/06/12 0229 12/06/12 0820  TROPONINI <0.30 <0.30 <0.30   Urinalysis:   Recent Labs Lab 12/05/12 2207  COLORURINE YELLOW  LABSPEC 1.016  PHURINE 5.5  GLUCOSEU NEGATIVE  HGBUR MODERATE*  BILIRUBINUR NEGATIVE  KETONESUR NEGATIVE  PROTEINUR NEGATIVE  UROBILINOGEN 0.2  NITRITE NEGATIVE  LEUKOCYTESUR NEGATIVE   Lipid Panel    Component Value Date/Time   CHOL 146 12/06/2012 0820   TRIG 103 12/06/2012 0820   HDL 47 12/06/2012 0820   CHOLHDL 3.1 12/06/2012 0820   VLDL 21 12/06/2012 0820   LDLCALC 78 12/06/2012 0820   HgbA1C  Lab Results  Component Value Date   HGBA1C 5.5 12/05/2012    Urine Drug Screen:      Component Value Date/Time   LABOPIA NONE DETECTED 12/05/2012 2207   LABOPIA NEGATIVE 10/14/2010 2140   COCAINSCRNUR NONE DETECTED 12/05/2012 2207   COCAINSCRNUR  Value: POSITIVE (NOTE) Result repeated and verified. Sent for confirmatory testing* 10/14/2010 2140   LABBENZ NONE DETECTED 12/05/2012 2207   LABBENZ NEGATIVE 10/14/2010 2140   AMPHETMU NONE DETECTED 12/05/2012 2207   AMPHETMU NEGATIVE 10/14/2010 2140   THCU NONE DETECTED 12/05/2012 2207   LABBARB NONE DETECTED 12/05/2012 2207    Alcohol Level:   Recent Labs Lab 12/05/12 1851  ETH 81*   CT of the brain   12/07/2012 Stable appearance of the areas of focal ischemic change in the genu of the corpus callosum on the right and in the right inferior cerebellum. Mild sulci effacement without midline shift. No evidence of acute intracranial hemorrhage. 12/05/2012  Atrophy and chronic ischemic white matter disease without acute intracranial abnormality.  Chronic left paranasal sinus disease.   MRI of the brain  12/05/2012   Acute or subacute abnormality at the genu of the corpus callosum more to the right of midline.  This is most consistent with infarction.  The differential diagnosis does include tumor, but that is felt considerably less likely.  Probable acute infarction at the inferior cerebellum on the right. Loss of flow in the right vertebral artery since the previous study.  Extensive chronic small vessel disease elsewhere throughout the brain.  Old cortical and subcortical infarction at the vertex on the right.    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 Only a small amount of retrograde flow in the distal part of that vessel.  The left vertebral artery remains widely patent to the basilar.  Marked progression of atherosclerotic disease throughout the anterior and middle cerebral branches bilaterally, worse on the left than the right.      2D Echocardiogram  EF 65%, no ASD or PFO  identified.  Carotid Doppler  No significant extracranial carotid artery stenosis demonstrated. Vertebrals are patent with antegrade flow.  TCD Highly suboptimal and limited study due to absent bitemporal and occipital windows. Antegrade flow inbilateral opthalmic and right carotid arteries.  CXR  12/05/2012 Suboptimal inspiration. No acute cardiopulmonary disease.   EKG  normal sinus rhythm, RBBB, 1st degree AV block.   Therapy Recommendations SNF  Physical Exam   Elderly african [place] male not in distress.Awake alert. Afebrile. Head  is nontraumatic. Neck is supple without bruit. Hearing is normal. Cardiac exam no murmur or gallop. Lungs are clear to auscultation. Distal pulses are well felt. Neurological Exam ; awake alert oriented x3. Moderate dysarthria but can be understood. Eyes and primary position extraocular movements are full range with nystagmus on lateral gaze right more than left. Pupils are equal reactive. Fundi were not visualized. There is severe right lower face weakness. Tongue is midline. Cough and gag aren't week. No upper or lower eczema to drift. Diminished fine finger movements on the left. Finger-to-nose dysmetria on the right upper extremity. No focal weakness. Touch and pinprick sensation are preserved. Deep tendon pulses are 2+ symmetric. Plantars are downgoing.  ASSESSMENT [name] is a 62 y.o. male presenting with left sided weakness and slurred speech. Imaging confirms a right  genu of the corpus callosum, right inferior cerebellum infarct. Also, nonvisualized right vertebral artery. Etiology of Infarct intracranial atherosclerosis likely. On clopidogrel 75 mg orally every day prior to admission. Now on clopidogrel 75 mg orally every day for secondary stroke prevention. Patient with resultant right facial weakness, right ptosis.  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 Patient is allergic to aspirin.   Failed swallow: NPO, with NG currently  CIR recommended  SBP goal < 100, DBP < 110  Lynn D. Manson Passey, [facility], MBA, MHA [facility] Pager: [phone] 12/08/2012 11:23 AM  I have personally obtained a history, examined the patient, evaluated imaging results, and formulated the assessment and plan of care. I agree with the above. Delia Heady, MD

## 2012-12-08 NOTE — Progress Notes (Signed)
Physical Therapy Treatment Patient Details Name: [name] MRN: [id number] DOB: [birth year] Today's Date: 12/08/2012 Time: 1478-2956 PT Time Calculation (min): 44 min  PT Assessment / Plan / Recommendation Comments on Treatment Session  Pt showed willingness to participate in all activities.  Able to stand and ambulate small distance with assistance of 2.  Lack of control of R lower extremity suspected rather than strength deficit due to  5/5 MMT for bilateral quadriceps and intact lower extremity proprioception.  Acute PT needed to improve weight shifting and control for saftey and  functional mobility.    Follow Up Recommendations  CIR     Frequency Min 4X/week   Plan Discharge plan needs to be updated;Frequency remains appropriate    Precautions / Restrictions Precautions Precautions: Fall Precaution Comments: Impulsive with diminished control of right lower extremity, and lack of weight shift   Pertinent Vitals/Pain Pt denies pain    Mobility  Bed Mobility Bed Mobility: Supine to Sit Supine to Sit: 4: Min guard Details for Bed Mobility Assistance: Diminished sitting balance Transfers Transfers: Sit to Stand;Stand to Sit Sit to Stand: 4: Min assist (v/c to activate lower extremities. facilitate hip extension) Stand to Sit: 3: Mod assist (once initiated, minimal pt control of descent) Details for Transfer Assistance: v/c to slow down  Ambulation/Gait Ambulation/Gait Assistance: 1: +2 Total assist Ambulation/Gait: Patient Percentage: 70% Ambulation Distance (Feet): 20 Feet Assistive device: Rolling walker Ambulation/Gait Assistance Details: Pt very reliant on walker and lacking weight shift with ambulation.  R knee hyperextension in stance despite adequate strength in quads.  V/c needed to slow down pt. Gait Pattern: Step-to pattern;Decreased stride length;Decreased weight shift to left;Decreased weight shift to right Stairs: No Wheelchair Mobility Wheelchair Mobility:  No    Exercises Total Joint Exercises Long Arc Quad: AROM;Both;5 reps    PT Goals Acute Rehab PT Goals PT Goal Formulation: With patient Time For Goal Achievement: 12/20/12 Potential to Achieve Goals: Good PT Goal: Supine/Side to Sit - Progress: Progressing toward goal PT Goal: Sit at Edge Of Bed - Progress: Progressing toward goal PT Goal: Sit to Supine/Side - Progress: Progressing toward goal PT Goal: Sit to Stand - Progress: Progressing toward goal PT Goal: Stand to Sit - Progress: Progressing toward goal PT Goal: Stand - Progress: Progressing toward goal PT Goal: Ambulate - Progress: Progressing toward goal PT Goal: Propel Wheelchair - Progress: Progressing toward goal  Visit Information  Last PT Received On: 12/08/12 Assistance Needed: +2    Subjective Data  Subjective: Pt reports feeling good, but wants to eat. Patient Stated Goal: Walking and eating   Cognition  Cognition Arousal/Alertness: Awake/alert Behavior During Therapy: Impulsive Overall Cognitive Status: Within Functional Limits for tasks assessed    Balance  Balance Balance Assessed: Yes Static Sitting Balance Static Sitting - Balance Support: Bilateral upper extremity supported Static Sitting - Level of Assistance: 5: Stand by assistance Static Standing Balance Static Standing - Balance Support: Bilateral upper extremity supported Static Standing - Level of Assistance: 4: Min assist;3: Mod assist Dynamic Standing Balance Dynamic Standing - Balance Support: Bilateral upper extremity supported;During functional activity Dynamic Standing - Balance Activities: Reaching for objects;Lateral lean/weight shifting Dynamic Standing - Comments: Weight shif performed in standing position and staggered stance using walker for bilateral UE support 6-8 x each position.  Also incorperated reaching for objects to facilitate weight shift to both sides.  When reaching, unilateral UE support performed 6- x each side.  Facilitation to R knee to prevent hyperextension during weight shift.  V/c for improved control.  End of Session PT - End of Session Equipment Utilized During Treatment: Gait belt Activity Tolerance: Patient limited by fatigue Patient left: in chair;with family/visitor present   GP     [name] 12/08/2012, 4:30 PM [name], PT Student

## 2012-12-09 ENCOUNTER — Encounter (HOSPITAL_COMMUNITY): Payer: Self-pay | Admitting: *Deleted

## 2012-12-09 ENCOUNTER — Inpatient Hospital Stay (HOSPITAL_COMMUNITY)
Admission: AD | Admit: 2012-12-09 | Discharge: 2013-01-02 | DRG: 945 | Disposition: A | Discharge status: Skilled Nursing Facility | Payer: Medicaid Other | Source: Intra-hospital | Attending: Physical Medicine & Rehabilitation | Admitting: Physical Medicine & Rehabilitation

## 2012-12-09 ENCOUNTER — Inpatient Hospital Stay (HOSPITAL_COMMUNITY): Payer: Medicaid Other

## 2012-12-09 DIAGNOSIS — K219 Gastro-esophageal reflux disease without esophagitis: Secondary | ICD-10-CM

## 2012-12-09 DIAGNOSIS — J449 Chronic obstructive pulmonary disease, unspecified: Secondary | ICD-10-CM

## 2012-12-09 DIAGNOSIS — G4700 Insomnia, unspecified: Secondary | ICD-10-CM

## 2012-12-09 DIAGNOSIS — E876 Hypokalemia: Secondary | ICD-10-CM

## 2012-12-09 DIAGNOSIS — H02409 Unspecified ptosis of unspecified eyelid: Secondary | ICD-10-CM

## 2012-12-09 DIAGNOSIS — N289 Disorder of kidney and ureter, unspecified: Secondary | ICD-10-CM

## 2012-12-09 DIAGNOSIS — F17200 Nicotine dependence, unspecified, uncomplicated: Secondary | ICD-10-CM

## 2012-12-09 DIAGNOSIS — Z5189 Encounter for other specified aftercare: Principal | ICD-10-CM

## 2012-12-09 DIAGNOSIS — I6340 Cerebral infarction due to embolism of unspecified cerebral artery: Secondary | ICD-10-CM

## 2012-12-09 DIAGNOSIS — R066 Hiccough: Secondary | ICD-10-CM

## 2012-12-09 DIAGNOSIS — I6330 Cerebral infarction due to thrombosis of unspecified cerebral artery: Secondary | ICD-10-CM

## 2012-12-09 DIAGNOSIS — Z8546 Personal history of malignant neoplasm of prostate: Secondary | ICD-10-CM

## 2012-12-09 DIAGNOSIS — Z886 Allergy status to analgesic agent status: Secondary | ICD-10-CM

## 2012-12-09 DIAGNOSIS — Z8249 Family history of ischemic heart disease and other diseases of the circulatory system: Secondary | ICD-10-CM

## 2012-12-09 DIAGNOSIS — C61 Malignant neoplasm of prostate: Secondary | ICD-10-CM

## 2012-12-09 DIAGNOSIS — Z8673 Personal history of transient ischemic attack (TIA), and cerebral infarction without residual deficits: Secondary | ICD-10-CM

## 2012-12-09 DIAGNOSIS — I10 Essential (primary) hypertension: Secondary | ICD-10-CM

## 2012-12-09 DIAGNOSIS — Z7902 Long term (current) use of antithrombotics/antiplatelets: Secondary | ICD-10-CM

## 2012-12-09 DIAGNOSIS — R471 Dysarthria and anarthria: Secondary | ICD-10-CM

## 2012-12-09 DIAGNOSIS — R1310 Dysphagia, unspecified: Secondary | ICD-10-CM

## 2012-12-09 DIAGNOSIS — Z79899 Other long term (current) drug therapy: Secondary | ICD-10-CM

## 2012-12-09 DIAGNOSIS — Z880 Allergy status to penicillin: Secondary | ICD-10-CM

## 2012-12-09 DIAGNOSIS — R29898 Other symptoms and signs involving the musculoskeletal system: Secondary | ICD-10-CM

## 2012-12-09 DIAGNOSIS — I639 Cerebral infarction, unspecified: Secondary | ICD-10-CM

## 2012-12-09 DIAGNOSIS — J4489 Other specified chronic obstructive pulmonary disease: Secondary | ICD-10-CM

## 2012-12-09 DIAGNOSIS — E785 Hyperlipidemia, unspecified: Secondary | ICD-10-CM

## 2012-12-09 LAB — BASIC METABOLIC PANEL
Chloride: 103 mEq/L (ref 96–112)
GFR calc Af Amer: 73 mL/min — ABNORMAL LOW (ref >90)
GFR calc non Af Amer: 63 mL/min — ABNORMAL LOW (ref >90)
Potassium: 3.8 mEq/L (ref 3.5–5.1)
Sodium: 138 mEq/L (ref 135–145)

## 2012-12-09 LAB — CBC
HCT: 40.9 % (ref 39.0–52.0)
MCH: 30.1 pg (ref 26.0–34.0)
MCV: 86.1 fL (ref 78.0–100.0)
RBC: 4.75 MIL/uL (ref 4.22–5.81)
WBC: 9.4 10*3/uL (ref 4.0–10.5)

## 2012-12-09 LAB — GLUCOSE, CAPILLARY
Glucose-Capillary: 107 mg/dL — ABNORMAL HIGH (ref 70–99)
Glucose-Capillary: 109 mg/dL — ABNORMAL HIGH (ref 70–99)

## 2012-12-09 LAB — CREATININE, SERUM: GFR calc Af Amer: 76 mL/min — ABNORMAL LOW (ref >90)

## 2012-12-09 MED ORDER — PROCHLORPERAZINE EDISYLATE 5 MG/ML IJ SOLN
5.0000 mg | Freq: Four times a day (QID) | INTRAMUSCULAR | Status: DC | PRN
  Filled 2012-12-09: qty 2

## 2012-12-09 MED ORDER — ACETAMINOPHEN 325 MG PO TABS
325.0000 mg | ORAL_TABLET | ORAL | Status: DC | PRN
  Administered 2012-12-11 – 2012-12-12 (×3): 650 mg
  Filled 2012-12-09 (×4): qty 2

## 2012-12-09 MED ORDER — SODIUM CHLORIDE 0.9 % IV SOLN
INTRAVENOUS | Status: DC
  Administered 2012-12-09: 17:00:00 via INTRAVENOUS

## 2012-12-09 MED ORDER — HYDROCHLOROTHIAZIDE 25 MG PO TABS
25.0000 mg | ORAL_TABLET | Freq: Every day | ORAL | Status: DC
  Administered 2012-12-10 – 2012-12-22 (×12): 25 mg via ORAL
  Filled 2012-12-09 (×16): qty 1

## 2012-12-09 MED ORDER — JEVITY 1.2 CAL PO LIQD
1000.0000 mL | ORAL | Status: DC
  Administered 2012-12-10: 11:00:00
  Filled 2012-12-09 (×4): qty 1000

## 2012-12-09 MED ORDER — SODIUM CHLORIDE 0.9 % IV SOLN: 250.0000 mL | INTRAVENOUS | Status: DC | PRN

## 2012-12-09 MED ORDER — FREE WATER
200.0000 mL | Freq: Four times a day (QID) | Status: DC
  Administered 2012-12-10 – 2012-12-13 (×15): 200 mL

## 2012-12-09 MED ORDER — PROCHLORPERAZINE MALEATE 5 MG PO TABS
5.0000 mg | ORAL_TABLET | Freq: Four times a day (QID) | ORAL | Status: DC | PRN
  Filled 2012-12-09: qty 2

## 2012-12-09 MED ORDER — BISACODYL 10 MG RE SUPP: 10.0000 mg | Freq: Every day | RECTAL | Status: DC | PRN

## 2012-12-09 MED ORDER — POTASSIUM CHLORIDE 20 MEQ/15ML (10%) PO LIQD
40.0000 meq | Freq: Two times a day (BID) | ORAL | Status: DC
  Administered 2012-12-10 – 2012-12-13 (×7): 40 meq via ORAL
  Filled 2012-12-09 (×10): qty 30

## 2012-12-09 MED ORDER — PROCHLORPERAZINE 25 MG RE SUPP
12.5000 mg | Freq: Four times a day (QID) | RECTAL | Status: DC | PRN
  Filled 2012-12-09: qty 1

## 2012-12-09 MED ORDER — ALUM & MAG HYDROXIDE-SIMETH 200-200-20 MG/5ML PO SUSP: 30.0000 mL | ORAL | Status: DC | PRN

## 2012-12-09 MED ORDER — CHLORHEXIDINE GLUCONATE 0.12 % MT SOLN
15.0000 mL | Freq: Two times a day (BID) | OROMUCOSAL | Status: DC
  Administered 2012-12-09 – 2013-01-02 (×45): 15 mL via OROMUCOSAL
  Filled 2012-12-09 (×50): qty 15

## 2012-12-09 MED ORDER — CLOPIDOGREL BISULFATE 75 MG PO TABS
75.0000 mg | ORAL_TABLET | Freq: Every day | ORAL | Status: DC
  Administered 2012-12-10 – 2012-12-16 (×7): 75 mg via ORAL
  Filled 2012-12-09 (×8): qty 1

## 2012-12-09 MED ORDER — ATORVASTATIN CALCIUM 80 MG PO TABS
80.0000 mg | ORAL_TABLET | Freq: Every day | ORAL | Status: DC
  Administered 2012-12-10 – 2012-12-20 (×11): 80 mg via ORAL
  Filled 2012-12-09 (×13): qty 1

## 2012-12-09 MED ORDER — FLEET ENEMA 7-19 GM/118ML RE ENEM
1.0000 | ENEMA | Freq: Once | RECTAL | Status: AC | PRN
  Filled 2012-12-09: qty 1

## 2012-12-09 MED ORDER — ALBUTEROL SULFATE HFA 108 (90 BASE) MCG/ACT IN AERS
2.0000 | INHALATION_SPRAY | Freq: Four times a day (QID) | RESPIRATORY_TRACT | Status: DC | PRN
Start: 2012-12-09 — End: 2012-12-14
  Administered 2012-12-13: 2 via RESPIRATORY_TRACT
  Filled 2012-12-09: qty 6.7

## 2012-12-09 MED ORDER — ATORVASTATIN CALCIUM 80 MG PO TABS: 80.0000 mg | ORAL_TABLET | Freq: Every day | ORAL | Status: DC

## 2012-12-09 MED ORDER — TIOTROPIUM BROMIDE MONOHYDRATE 18 MCG IN CAPS
18.0000 ug | ORAL_CAPSULE | Freq: Every day | RESPIRATORY_TRACT | Status: DC
  Administered 2012-12-10 – 2013-01-02 (×20): 18 ug via RESPIRATORY_TRACT
  Filled 2012-12-09 (×6): qty 5

## 2012-12-09 MED ORDER — BIOTENE DRY MOUTH MT LIQD
15.0000 mL | Freq: Two times a day (BID) | OROMUCOSAL | Status: DC
  Administered 2012-12-10 – 2012-12-31 (×35): 15 mL via OROMUCOSAL

## 2012-12-09 MED ORDER — GUAIFENESIN-DM 100-10 MG/5ML PO SYRP: 5.0000 mL | ORAL_SOLUTION | Freq: Four times a day (QID) | ORAL | Status: DC | PRN

## 2012-12-09 MED ORDER — FREE WATER: 160.0000 mL | Freq: Four times a day (QID) | Status: DC

## 2012-12-09 MED ORDER — TRAZODONE HCL 50 MG PO TABS
25.0000 mg | ORAL_TABLET | Freq: Every evening | ORAL | Status: DC | PRN
  Administered 2012-12-11 (×2): 50 mg via NASOGASTRIC
  Administered 2012-12-12: 25 mg via NASOGASTRIC
  Filled 2012-12-09 (×3): qty 1

## 2012-12-09 MED ORDER — ENOXAPARIN SODIUM 40 MG/0.4ML ~~LOC~~ SOLN
40.0000 mg | SUBCUTANEOUS | Status: DC
  Administered 2012-12-10 – 2013-01-02 (×23): 40 mg via SUBCUTANEOUS
  Filled 2012-12-09 (×24): qty 0.4

## 2012-12-09 MED ORDER — INSULIN ASPART 100 UNIT/ML ~~LOC~~ SOLN
0.0000 [IU] | Freq: Three times a day (TID) | SUBCUTANEOUS | Status: DC
  Administered 2012-12-11 – 2012-12-13 (×3): 1 [IU] via SUBCUTANEOUS

## 2012-12-09 MED ORDER — LISINOPRIL 10 MG PO TABS
10.0000 mg | ORAL_TABLET | Freq: Every day | ORAL | Status: DC
  Administered 2012-12-10 – 2012-12-22 (×13): 10 mg via ORAL
  Filled 2012-12-09 (×15): qty 1

## 2012-12-09 MED ORDER — BIOTENE DRY MOUTH MT LIQD: 15.0000 mL | Freq: Two times a day (BID) | OROMUCOSAL | Status: DC

## 2012-12-09 MED ORDER — INSULIN ASPART 100 UNIT/ML ~~LOC~~ SOLN: 0.0000 [IU] | Freq: Every day | SUBCUTANEOUS | Status: DC

## 2012-12-09 NOTE — Progress Notes (Signed)
Speech Language Pathology Dysphagia Treatment Patient Details Name: [name] MRN: [id number] DOB: 04-24-1951 Today's Date: 12/09/2012 Time: 0920-0950 SLP Time Calculation (min): 30 min  Assessment / Plan / Recommendation Clinical Impression  Session focused on swallow retraining.  Panda removed; pt states it became dislodged.   Demonstrates improved symmetry right face and improved cough.  Continues with congestion, bilateral rhonchi, and difficulty managing secretions.  Pt verbalizing distress at not being able to eat.  Reviewed basic anatomy and the effects of the stroke upon his swallow - after review, pt verbalized willingness to replace Panda.  Engaged in therapeutic exercise with mod assist for completion (masako, modified Shaker). Consumed limited ice chips with persisting clinical symptoms of severe dysphagia - however, cough is stronger and may be more effective in clearing airway.  REC: Continued NPO, temporary enteral feeding, and swallow retraining.      Diet Recommendation  Continue with Current Diet: NPO    SLP Plan Continue with current plan of care   Pertinent Vitals/Pain No pain   Swallowing Goals  SLP Swallowing Goals Goal #3: Pt will execute effortful swallow five times per session with max assist Swallow Study Goal #3 - Progress: Progressing toward goal Goal #4: Pt will execute pharyngeal strengthening exercises with min cues 10x/session Swallow Study Goal #4 - Progress: Progressing toward goal  General Temperature Spikes Noted: No Behavior/Cognition: Alert;Cooperative Oral Cavity - Dentition: Poor condition Patient Positioning: Upright in bed  Oral Cavity - Oral Hygiene Does patient have any of the following "at risk" factors?: Nutritional status - inadequate;Other - dysphagia Patient is HIGH RISK - Oral Care Protocol followed (see row info): Yes   Dysphagia Treatment Treatment focused on: Patient/family/caregiver education;Facilitation of pharyngeal  phase;Other (comment) Treatment Methods/Modalities: Masako Maneuver;Shaker exercise (towel tuck) Patient observed directly with PO's: Yes Type of PO's observed: Ice chips Liquids provided via: Teaspoon Pharyngeal Phase Signs & Symptoms: Suspected delayed swallow initiation;Wet vocal quality;Immediate cough Type of cueing: Verbal Amount of cueing: Moderate      [name] L. [name], [place] CCC/SLP Pager [phone]  [name] [name] 12/09/2012, 10:12 AM

## 2012-12-09 NOTE — Progress Notes (Signed)
Upon rounding this morning around 0610, writer noticed PANDA from patient's rt nare has been removed. Patient states he wants real food and does not want PANDA. Educated him on not passing swallow eval and he agreed. L. [place], PA  Notified and gave verbal order to keep patient NPO till rounding team comes by and for Respiratory to do deep suctioning as patient is really congested. Order carried out.

## 2012-12-09 NOTE — Plan of Care (Signed)
Overall Plan of Care [facility]) Patient Details Name: [name] MRN: [id number] DOB: 1951/07/16  Diagnosis:  Thrombotic R cerebellar infarct  Co-morbidities: Hypertension  05/18/2007  .  COPD  05/18/2007  .  Elevated PSA  05/25/2007  .  Stroke  10/14/2010  Right centrum semiovale    Functional Problem List  Patient demonstrates impairments in the following areas: Balance, Cognition, Endurance, Medication Management, Motor, Nutrition and Safety  Basic ADL's: grooming, bathing, dressing, toileting and transfers Advanced ADL's: simple meal preparation, laundry and light housekeeping  Transfers:  bed to chair, toilet, tub/shower, car, furniture and floor Locomotion:  ambulation, wheelchair mobility and stairs  Additional Impairments:  Swallowing, Communication  expression and Social Cognition   awareness  Anticipated Outcomes Item Anticipated Outcome  Eating/Swallowing  Mod A with least restrictive diet  Basic self-care  MOD I  Tolieting  MOD I  Bowel/Bladder  Continent of bowel and bladder independently.  Transfers  Mod I  Locomotion  Mod I  Communication  supervision  Cognition  Min A for anticipatory awareness in regards to dysphagia  Pain  Pain controlled less than 3.  Safety/Judgment  MOD I  Other     Therapy Plan: PT Intensity: Minimum of 1-2 x/day ,45 to 90 minutes PT Frequency: 5 out of 7 days PT Duration Estimated Length of Stay: 2 weeks OT Intensity: Minimum of 1-2 x/day, 45 to 90 minutes OT Frequency: 5 out of 7 days OT Duration/Estimated Length of Stay: 2 weeks SLP Intensity: Minumum of 1-2 x/day, 30 to 90 minutes SLP Frequency: 5 out of 7 days SLP Duration/Estimated Length of Stay: 2 weeks    Team Interventions: Item RN PT OT SLP SW TR Other  Self Care/Advanced ADL Retraining   X      Neuromuscular Re-Education  x X x     Therapeutic Activities  x X x     UE/LE Strength Training/ROM  x X      UE/LE Coordination Activities  x X       Visual/Perceptual Remediation/Compensation         DME/Adaptive Equipment Instruction  x X      Therapeutic Exercise  x X x     Balance/Vestibular Training  x X      Patient/Family Education x x X x     Cognitive Remediation/Compensation  x X x     Functional Mobility Training  x X      Ambulation/Gait Training  x       Stair Training  x       Wheelchair Propulsion/Positioning  x X      Control and instrumentation engineer  x       Community Reintegration  x       Dysphagia/Aspiration Precaution Training    x     Speech/Language Facilitation    x     Bladder Management x        Bowel Management x        Disease Management/Prevention x        Pain Management x x X      Medication Management x        Skin Care/Wound Management x        Splinting/Orthotics  x X      Discharge Planning  x X      Psychosocial Support   X x         x  Team Discharge Planning: Destination: PT-Home ,OT-  HOME , SLP- (TBD, home vs. SNF) Projected Follow-up: PT-Home health PT, OT-HOME VS SNF   , SLP-24 hour supervision/assistance (TBD, Home health vs. SNF) Projected Equipment Needs: PT- , OT-TBA  , SLP-None recommended by SLP Patient/family involved in discharge planning: PT- Patient,  OT-Patient, SLP-Patient  MD ELOS: 10-14 days Medical Rehab Prognosis:  Good Assessment: 62 yo male with prior CVA admitted with recurrent CVA R cerebellar with severe dysphagia, now requiring 24/7 rehab RN/MD, CIR level PT/OT/SLP.  Team to focus on swallow , pulmonary tiolet, nutrition as well as fine motor and balance deficits.  Goals sup/modI    See Team Conference Notes for weekly updates to the plan of care

## 2012-12-09 NOTE — Progress Notes (Signed)
[facility] PROGRESS NOTE Assessment/Plan: Hypertensive emergency/ Malignant hypertension - started on admission on nicardipine drip titrated off. - currently on lisinopril and HCTZ - BP <160/90  Stroke, acute, embolic - MRI 4.28.2014: Acute or subacute abnormality at the genu of the corpus callosum more to the right of midline. T - neurology consulted. Recommended plavix. - failed swallowing evaluation. Reinsert PANDA. - CIR consulted. - ECHO 4.29.2014: estimated ejection fraction was in the range of 65% to 70% - carotid doppler 4.29.2014: showed no ICA stenosis. - statins.   Dyslipidemia - statins    Alcohol abuse: - thiamine and folate. - moniotr with CIWA.   Hypokalemia - replete check a mag   Smokes tobacco daily   Code Status: full Family Communication: none  Disposition Plan: inpatient 2-3 days   Consultants:  neuro  Procedures: 4/28 Head CT >>>Atrophy and chronic ischemic white matter disease without acute<BR>intracranial abnormality. Chronic left paranasal sinus disease  4/28 Brain MRI >>> acute right cerebellar and right corpus callosum infarcts   Antibiotics:  HPI/Subjective: He relates he wants to eat. PANDA came out overnight.  Objective: Filed Vitals:   12/09/12 0108 12/09/12 0500 12/09/12 0501 12/09/12 0700  BP: 135/83  132/73   Pulse: 80  86 90  Temp: 97.5 F (36.4 C)  97.6 F (36.4 C)   TempSrc: Oral  Oral   Resp: 20  18 18   Height:      Weight:  76.703 kg (169 lb 1.6 oz)    SpO2: 95%  96% 98%    Intake/Output Summary (Last 24 hours) at 12/09/12 0943 Last data filed at 12/08/12 1300  Gross per 24 hour  Intake      0 ml  Output    275 ml  Net   -275 ml   Filed Weights   12/06/12 0145 12/08/12 0621 12/09/12 0500  Weight: 78.2 kg (172 lb 6.4 oz) 75.3 kg (166 lb 0.1 oz) 76.703 kg (169 lb 1.6 oz)    Exam:  General: Alert, awake, oriented x3, in no acute distress.  HEENT: No bruits, no goiter.  Heart: Regular rate and  rhythm, without murmurs, rubs, gallops.  Lungs: Good air movement, bilateral air movement.  Abdomen: Soft, nontender, nondistended, positive bowel sounds.  Neuro: Grossly intact, nonfocal.   Data Reviewed: Basic Metabolic Panel:  Recent Labs Lab 12/05/12 1855 12/06/12 0229 12/06/12 0820 12/07/12 0345 12/08/12 0525 12/09/12 0600  NA 143 138 134* 138 139 138  K 3.1* 3.8 4.1 3.6 3.4* 3.8  CL 108 103 101 105 103 103  CO2  --  24 24 26 25 26   GLUCOSE 126* 143* 105* 95 112* 118*  BUN 7 8 8 8 17  26*  CREATININE 1.20 0.71 0.69 0.75 1.03 1.20  CALCIUM  --  9.7 9.9 9.7 9.9 10.1  MG  --  1.7  --  2.0 2.2  --   PHOS  --  3.2  --  2.9  --   --    Liver Function Tests:  Recent Labs Lab 12/05/12 1851  AST 40*  ALT 36  ALKPHOS 87  BILITOT 0.3  PROT 8.3  ALBUMIN 3.2*   No results found for this basename: LIPASE, AMYLASE,  in the last 168 hours No results found for this basename: AMMONIA,  in the last 168 hours CBC:  Recent Labs Lab 12/05/12 1851 12/05/12 1855 12/06/12 0820  WBC 9.4  --  10.4  NEUTROABS 3.2  --  7.1  HGB 13.3 13.9 14.1  HCT  37.4* 41.0 39.1  MCV 83.5  --  84.1  PLT 275  --  279   Cardiac Enzymes:  Recent Labs Lab 12/05/12 1851 12/05/12 2128 12/06/12 0229 12/06/12 0820  TROPONINI <0.30 <0.30 <0.30 <0.30   BNP (last 3 results) No results found for this basename: PROBNP,  in the last 8760 hours CBG:  Recent Labs Lab 12/05/12 2040 12/07/12 2210 12/08/12 2045 12/09/12 0109 12/09/12 0503  GLUCAP 99 98 107* 109* 123*    Recent Results (from the past 240 hour(s))  MRSA PCR SCREENING     Status: None   Collection Time    12/06/12  1:38 AM      Result Value Range Status   MRSA by PCR NEGATIVE  NEGATIVE Final   Comment:            The GeneXpert MRSA Assay (FDA     approved for NASAL specimens     only), is one component of a     comprehensive MRSA colonization     surveillance program. It is not     intended to diagnose MRSA      infection nor to guide or     monitor treatment for     MRSA infections.     Studies: No results found.  Scheduled Meds: . antiseptic oral rinse  15 mL Mouth Rinse q12n4p  . atorvastatin  80 mg Oral q1800  . chlorhexidine  15 mL Mouth Rinse BID  . clopidogrel  75 mg Oral Daily  . enoxaparin (LOVENOX) injection  40 mg Subcutaneous Q24H  . free water  160 mL Per Tube Q6H  . hydrochlorothiazide  25 mg Oral Daily  . lisinopril  10 mg Oral Daily  . pantoprazole (PROTONIX) IV  40 mg Intravenous Q24H  . potassium chloride  40 mEq Oral BID  . sodium chloride  3 mL Intravenous Q12H  . tiotropium  18 mcg Inhalation Daily   Continuous Infusions: . feeding supplement (JEVITY 1.2 CAL) 65 mL (12/08/12 1400)     Radonna Ricker Rosine Beat  [facility] Pager [phone]. If 8PM-8AM, please contact night-coverage at [email or web address], password [facility] 12/09/2012, 9:43 AM  LOS: 4 days

## 2012-12-09 NOTE — H&P (View-Only) (Signed)
Physical Medicine and Rehabilitation Admission H&P    Chief Complaint  Patient presents with  . Left sided weakness, slurred speech, problems walking.  : HPI:  [name] is a 61 y.o. RH-male with history of HTN, COPD, prior CVA; who was admitted on 12/05/12 with acute onset of left hand weakness, unsteady gait and slurred speech. Noted to have elevated SBP-220. Patient reported falls x 3 and dizziness earlier that day. 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 Work up initiated and ST evaluation reveals signs of moderate to severe dysphagia with difficulty handling oral secretions therefore NPO recommended with alternative means of nutrition for now. Panda placed but patient continues to ask for po's.Carotid dopplers without ICA stenosis.  2D echo done revealing EF 65-70% with grade 1 diastolic dysfunction. PT/OT evaluations done revealing patient with poor spacial awareness with decreased vision, ataxia and severe balance deficits. MD recommending CIR   Review of Systems  HENT: Negative for hearing loss.   Eyes: Negative for double vision.  Respiratory: Positive for cough. Negative for shortness of breath.   Cardiovascular: Negative for chest pain and palpitations.  Gastrointestinal: Negative for heartburn, nausea and vomiting.       Hungry.  Genitourinary: Negative for urgency and frequency.  Musculoskeletal: Negative for myalgias and back pain.  Neurological: Positive for speech change and focal weakness. Negative for headaches.  Psychiatric/Behavioral: The patient has insomnia (due to multiple medical interventions. ).    Past Medical History  Diagnosis Date  . Hypertension 05/18/2007  . COPD 05/18/2007  . Elevated PSA 05/25/2007  . 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 Stroke 12/05/2012   Past Surgical History  Procedure Laterality Date  . 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 Liver disease Mother   . Heart attack Mother 42   Social History: Lives in a boarding house--reports friends there can provide some help past discharge. Used work put down carpet.--retired/disabled since 2012. Independent PTA. He reports that he has been smoking 3 Cigarettes/day. He has been smoking about 0.33 packs per day. He has never used smokeless tobacco. He reports that he drinks about 1/2 pint liquor daily. He reports that he does not use illicit drugs.    Allergies  Allergen Reactions  . Aspirin Hives  . Penicillins Hives   Medications Prior to Admission  Medication Sig Dispense Refill  . albuterol (PROVENTIL HFA;VENTOLIN HFA) 108 (90 BASE) MCG/ACT inhaler Inhale 2 puffs into the lungs every 6 (six) hours as needed for wheezing.  8.5 g  11  . clopidogrel (PLAVIX) 75 MG tablet Take 1 tablet (75 mg total) by mouth daily.  30 tablet  11  . hydrochlorothiazide (HYDRODIURIL) 25 MG tablet Take 1 tablet (25 mg total) by mouth daily.      . lisinopril (PRINIVIL,ZESTRIL) 10 MG tablet Take 1 tablet (10 mg total) by mouth daily.  30 tablet  11  . tiotropium (SPIRIVA HANDIHALER) 18 MCG inhalation capsule Place 1 capsule (18 mcg total) into inhaler and inhale daily.  30 capsule  11  . [DISCONTINUED] pravastatin (PRAVACHOL) 80 MG tablet Take 1 tablet (80 mg total) by mouth daily.  30 tablet  11    Home: Home Living Lives With: Alone Type of Home: Other (Comment) (boarding house) Home Access: Level entry Home Layout: One level Bathroom Shower/Tub: Walk-in shower;Door Bathroom   Toilet: Standard Home Adaptive Equipment: None   Functional History: Prior Function Able to Take Stairs?: No Driving: No Vocation: Unemployed Comments: take the bus or walk  Functional Status:  Mobility: Bed Mobility Bed Mobility: Supine to Sit Supine to Sit: 4: Min guard Sitting - Scoot to Edge of Bed: 4: Min guard;With rail Sit to Supine: 4: Min  guard;HOB flat Transfers Transfers: Sit to Stand;Stand to Sit Sit to Stand: 4: Min assist (v/c to activate lower extremities. facilitate hip extension) Stand to Sit: 3: Mod assist (once initiated, minimal pt control of descent) Ambulation/Gait Ambulation/Gait Assistance: 1: +2 Total assist Ambulation/Gait: Patient Percentage: 70% Ambulation Distance (Feet): 20 Feet Assistive device: Rolling walker Ambulation/Gait Assistance Details: Pt very reliant on walker and lacking weight shift with ambulation.  R knee hyperextension in stance despite adequate strength in quads.  V/c needed to slow down pt. Gait Pattern: Step-to pattern;Decreased stride length;Decreased weight shift to left;Decreased weight shift to right Stairs: No Wheelchair Mobility Wheelchair Mobility: No  ADL: ADL Eating/Feeding: NPO (requesting food) Where Assessed - Eating/Feeding: Chair Grooming: Wash/dry face;Set up Where Assessed - Grooming: Supported sitting Toilet Transfer: Minimal assistance Toilet Transfer Method: Sit to stand Toilet Transfer Equipment: Raised toilet seat with arms (or 3-in-1 over toilet) Equipment Used: Gait belt;Rolling walker Transfers/Ambulation Related to ADLs: Pt demonstrates deficits with somatosensory (toe lifting off floor, reports "kinda feel it" when asked if he can feel his feet on the floor, and supination of  foot) Pt scissoring with ambulation. Pt following sequence RW Right left RW. Pt with good recall when asked. pt needs constant v/c for hand placement and safety. Pt leaning to the right with mobility. Pt needs rest breaks and prolonged sitting to recover ADL Comments: Pt agreeable to OOB on arrival. pt with good recall of therapist from previous session. Pt continues to request food and currently with panda without running fluids. Pt progressed to EOB with LOB static sitting to the right. Pt able to initiate neutral posture but unable to sustain. Pt with lean to the right after ~15 -30  seconds of static sitting. Pt demonstrates nystagmus of eyes with horizontal beating. Pt today with incr nystagmus and reports blurred vision.   Cognition: Cognition Overall Cognitive Status: Within Functional Limits for tasks assessed Arousal/Alertness: Awake/alert Orientation Level: Oriented X4 Cognition Arousal/Alertness: Awake/alert Behavior During Therapy: Impulsive Overall Cognitive Status: Within Functional Limits for tasks assessed  Physical Exam: Blood pressure 123/80, pulse 89, temperature 97.8 F (36.6 C), temperature source Oral, resp. rate 18, height 5' 8" (1.727 m), weight 76.703 kg (169 lb 1.6 oz), SpO2 98.00%.  Physical Exam  Nursing note and vitals reviewed. Constitutional: He is oriented to person, place, and time. He appears well-developed and well-nourished.  HENT:  Head: Normocephalic and atraumatic.  Right Ear: External ear normal.  Left Ear: External ear normal.  Eyes: Conjunctivae and EOM are normal. Pupils are equal, round, and reactive to light.  Neck: Normal range of motion. No JVD present. No tracheal deviation present. No thyromegaly present.  Cardiovascular: Normal rate and regular rhythm.  Exam reveals no friction rub.   No murmur heard. Pulmonary/Chest: Effort normal. No respiratory distress. He has no wheezes. He has rhonchi in the right upper field and the left upper field. He has no rales.  Multiple upper airway sounds due to oral secretions.   Abdominal: Soft. Bowel sounds are normal. He exhibits no distension. There is no tenderness.  Musculoskeletal: He exhibits no edema.  Large nontender mass under left patella.   Does not affect ROM.  Neurological: He is alert and oriented to person, place, and time.  Wet voice with copious secretions, gurgling sounds with occasional coughing.  Soft voice. Right facial paresis with ptosis. Absent gag reflex. Poor insight with impaired awareness.  RLE>RUE weakness weakness.   Skin: Skin is warm and dry.     Results for orders placed during the hospital encounter of 12/05/12 (from the past 48 hour(s))  GLUCOSE, CAPILLARY     Status: None   Collection Time    12/07/12 10:10 PM      Result Value Range   Glucose-Capillary 98  70 - 99 mg/dL   Comment 1 Notify RN    BASIC METABOLIC PANEL     Status: Abnormal   Collection Time    12/08/12  5:25 AM      Result Value Range   Sodium 139  135 - 145 mEq/L   Potassium 3.4 (*) 3.5 - 5.1 mEq/L   Chloride 103  96 - 112 mEq/L   CO2 25  19 - 32 mEq/L   Glucose, Bld 112 (*) 70 - 99 mg/dL   BUN 17  6 - 23 mg/dL   Creatinine, Ser 1.03  0.50 - 1.35 mg/dL   Calcium 9.9  8.4 - 10.5 mg/dL   GFR calc non Af Amer 75 (*) >90 mL/min   GFR calc Af Amer 87 (*) >90 mL/min   Comment:            The eGFR has been calculated     using the CKD EPI equation.     This calculation has not been     validated in all clinical     situations.     eGFR's persistently     <90 mL/min signify     possible Chronic Kidney Disease.  MAGNESIUM     Status: None   Collection Time    12/08/12  5:25 AM      Result Value Range   Magnesium 2.2  1.5 - 2.5 mg/dL  GLUCOSE, CAPILLARY     Status: Abnormal   Collection Time    12/08/12  8:45 PM      Result Value Range   Glucose-Capillary 107 (*) 70 - 99 mg/dL   Comment 1 Notify RN    GLUCOSE, CAPILLARY     Status: Abnormal   Collection Time    12/09/12  1:09 AM      Result Value Range   Glucose-Capillary 109 (*) 70 - 99 mg/dL   Comment 1 Notify RN    GLUCOSE, CAPILLARY     Status: Abnormal   Collection Time    12/09/12  5:03 AM      Result Value Range   Glucose-Capillary 123 (*) 70 - 99 mg/dL   Comment 1 Notify RN    BASIC METABOLIC PANEL     Status: Abnormal   Collection Time    12/09/12  6:00 AM      Result Value Range   Sodium 138  135 - 145 mEq/L   Potassium 3.8  3.5 - 5.1 mEq/L   Chloride 103  96 - 112 mEq/L   CO2 26  19 - 32 mEq/L   Glucose, Bld 118 (*) 70 - 99 mg/dL   BUN 26 (*) 6 - 23 mg/dL   Creatinine,  Ser 1.20  0.50 - 1.35 mg/dL   Calcium 10.1  8.4 - 10.5 mg/dL   GFR calc non Af Amer 63 (*) >90 mL/min     GFR calc Af Amer 73 (*) >90 mL/min   Comment:            The eGFR has been calculated     using the CKD EPI equation.     This calculation has not been     validated in all clinical     situations.     eGFR's persistently     <90 mL/min signify     possible Chronic Kidney Disease.  GLUCOSE, CAPILLARY     Status: Abnormal   Collection Time    12/09/12 11:27 AM      Result Value Range   Glucose-Capillary 102 (*) 70 - 99 mg/dL      Post Admission Physician Evaluation: 1. 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 2. Patient is admitted to receive collaborative, interdisciplinary care between the physiatrist, rehab nursing staff, and therapy team. 3. Patient's level of medical complexity and substantial therapy needs in context of that medical necessity cannot be provided at a lesser intensity of care such as a SNF. 4. Patient has experienced substantial functional loss from his/her baseline which was documented above under the "Functional History" and "Functional Status" headings.  Judging by the patient's diagnosis, physical exam, and functional history, the patient has potential for functional progress which will result in measurable gains while on inpatient rehab.  These gains will be of substantial and practical use upon discharge  in facilitating mobility and self-care at the household level. 5. Physiatrist will provide 24 hour management of medical needs as well as oversight of the therapy plan/treatment and provide guidance as appropriate regarding the interaction of the two. 6. 24 hour rehab nursing will assist with bladder management, bowel management, safety, skin/wound care, disease management, medication administration and patient education  and help integrate therapy concepts, techniques,education, etc. 7. PT will assess and  treat for/with: Lower extremity strength, range of motion, stamina, balance, functional mobility, safety, adaptive techniques and equipment, NMR, vestibular assessment, coordination.   Goals are: supervision. 8. OT will assess and treat for/with: ADL's, functional mobility, safety, upper extremity strength, adaptive techniques and equipment, NMR, vestibular dysfunction and treatment.   Goals are: supervision to min assist. 9. SLP will assess and treat for/with: swallowing, speech, communication.  Goals are: min to mod assist. 10. Case Management and Social Worker will assess and treat for psychological issues and discharge planning. 11. Team conference will be held weekly to assess progress toward goals and to determine barriers to discharge. 12. Patient will receive at least 3 hours of therapy per day at least 5 days per week. 13. ELOS: 2wk (?lessen the burden of care upon next venue)      Prognosis:  good   Medical Problem List and Plan: 1. DVT Prophylaxis/Anticoagulation: Pharmaceutical: Lovenox 2. Pain Management: N/A 3. Mood:  Will have LCSW follow for evaluation.  4. Neuropsych: This patient is not capable of making decisions on his/her own behalf. 5. HTN: Monitor with bid checks. Continue HCTZ and Prinivil for now. May need to re-evaluate diuretic in face of modified diet and acute renal insufficiency.  6. COPD: Continue spiriva. Prn nebs for SOB. Keep HOB elevated at 30 degrees. Encourage IS.  7. Dysphagia: Continue NPO due to severe dysphagia.  TF for nutritional support.  Continue to educate on aspiration risk. Replace NGT. May need to increase FT rate. Check CXR to rule out aspiration. . Will change BS to qid to help patient get rest and as BS 98-123 range.    SSI for elevated BS.   Zachary T. Swartz, MD, FAAPMR  12/09/2012 

## 2012-12-09 NOTE — Progress Notes (Signed)
Rehab admissions - I spoke with patient today.  He was able to give me [facility]aze Boyden phone number [phone].  Crystal is his cousin.  Crystal can assist by taking patient to her home after an inpatient rehab stay.  She will get other family to assist after discharge as well.  She can take some time off from work as needed.  I hope to have a rehab bed either today or tomorrow.  Call me for questions.  #[phone]

## 2012-12-09 NOTE — Progress Notes (Signed)
Pt d/c to rehab. Assessment stable. Panda tube unable to be reinserted by RN's. IR notified for feeding tube placement. IR stated it would be Saturday 5/3 before they would be able to insert the feeding tube.

## 2012-12-09 NOTE — Progress Notes (Signed)
New patient admitted to 70, arrived at 17, no family present. Report received from Leland Johns, RN. Stroke information booklet provided, use of call button, suction, and bed control demonstrated. Patient safety video viewed by patient, Patient safety plan signed. NG tube not in place, patient is NPO, currently has NS infusing through PIV at 100cc/hr until NG replacement in the morning per RN report. Patient stable and resting. Admission complete.

## 2012-12-09 NOTE — PMR Pre-admission (Signed)
PMR Admission Coordinator Pre-Admission Assessment  Patient: [name] is an 62 y.o., male MRN: [id number] DOB: 22-Feb-1951 Height: 5\' 8"  (172.7 cm) Weight: 76.703 kg (169 lb 1.6 oz)              Insurance Information HMO:      PPO:       PCP:       IPA:       80/20:       OTHER:   PRIMARY: Medicaid [place] [facility]      Policy#: [id number]      Subscriber: Kae Heller CM Name:        Phone#:       Fax#:   Pre-Cert#:        Employer: Not working Benefits:  Phone #: [phone]      Name: Automated Eff. 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 male with history of HTN, COPD, prior CVA; who was admitted on 12/05/12 with acute onset of left hand weakness, unsteady gait and slurred speech. Noted to have elevated SBP-220. Patient reported falls x 3 earlier and dizziness that day. 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 Work up initiated and ST evaluation reveals signs of moderate to severe dysphagia with difficulty handling oral secretions therefore NPO recommended. MBS done with recommendations for alternative means of nutrition for now.Carotid dopplers without ICA stenosis. PT/OT evaluations done revealing patient with poor spacial awareness with decreased vision, ataxia and severe balance deficits. MD recommending CIR  Dysarthric but is intelligible. Denies any pains. Has tube feeding infusing.    Total: 3=NIH  GCS=15  Past  Medical History  Past Medical History  Diagnosis Date  . Hypertension 05/18/2007  . COPD 05/18/2007  . Elevated PSA 05/25/2007  . 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 Stroke 12/05/2012    Family History  family history includes Heart attack (age of onset: 27) in his mother and Liver disease in his mother.  Prior Rehab/Hospitalizations: No previous rehab admissions.   Current Medications  Current facility-administered medications:0.9 %  sodium chloride infusion, 250 mL, Intravenous, PRN, Lorretta Harp, MD, Last Rate: 10 mL/hr at 12/07/12 1700, 250 mL at 12/07/12 1700;  albuterol (PROVENTIL HFA;VENTOLIN HFA) 108 (90 BASE) MCG/ACT inhaler 2 puff, 2 puff, Inhalation, Q6H PRN, Ky Barban, MD;  antiseptic oral rinse (BIOTENE) solution 15 mL, 15 mL, Mouth Rinse, q12n4p, Lonia Farber, MD, 15 mL at 12/08/12 1630 atorvastatin (LIPITOR) tablet 80 mg, 80 mg, Oral, q1800, Lorretta Harp, MD, 80 mg at 12/08/12 1709;  chlorhexidine (PERIDEX) 0.12 % solution 15 mL, 15 mL, Mouth Rinse, BID, Lonia Farber, MD, 15 mL at 12/09/12 1046;  clopidogrel (PLAVIX) tablet 75 mg, 75  mg, Oral, Daily, Mathis Dad, MD, 75 mg at 12/08/12 0926;  enoxaparin (LOVENOX) injection 40 mg, 40 mg, Subcutaneous, Q24H, Layne Benton, NP, 40 mg at 12/09/12 1046 feeding supplement (JEVITY 1.2 CAL) liquid 1,000 mL, 1,000 mL, Per Tube, Continuous, Heather Cornelison Pitts, RD, Last Rate: 65 mL/hr at 12/08/12 1400, 65 mL at 12/08/12 1400;  free water 160 mL, 160 mL, Per Tube, Q6H, Heather Cornelison Pitts, RD, 160 mL at 12/08/12 2040;  hydrALAZINE (APRESOLINE) injection 10 mg, 10 mg, Intravenous, Q4H PRN, Nelda Bucks, MD hydrochlorothiazide (HYDRODIURIL) tablet 25 mg, 25 mg, Oral, Daily, Lonia Farber, MD, 25 mg at 12/08/12 0926;  lisinopril (PRINIVIL,ZESTRIL) tablet 10 mg, 10 mg, Oral, Daily, Lonia Farber, MD, 10 mg at 12/08/12 0926;  ondansetron (ZOFRAN) injection 4 mg, 4 mg,  Intravenous, Q8H PRN, Ky Barban, MD;  pantoprazole (PROTONIX) injection 40 mg, 40 mg, Intravenous, Q24H, Lorretta Harp, MD, 40 mg at 12/09/12 0235 potassium chloride 20 MEQ/15ML (10%) liquid 40 mEq, 40 mEq, Oral, BID, Marinda Elk, MD, 40 mEq at 12/08/12 2148;  sodium chloride 0.9 % injection 3 mL, 3 mL, Intravenous, Q12H, Lorretta Harp, MD, 3 mL at 12/08/12 2147;  sodium chloride 0.9 % injection 3 mL, 3 mL, Intravenous, PRN, Lorretta Harp, MD;  tiotropium [facility]) inhalation capsule 18 mcg, 18 mcg, Inhalation, Daily, Ky Barban, MD  Patients Current Diet: NPO  Precautions / Restrictions Precautions Precautions: Fall Precaution Comments: Impulsive with diminished control of right lower extremity, and lack of weight shift Restrictions Weight Bearing Restrictions: No   Prior Activity Level Community (5-7x/wk): Went out daily.  Home Assistive Devices / Equipment Home Assistive Devices/Equipment: None Home Adaptive Equipment: None  Prior Functional Level Prior Function Level of Independence: Independent Able to Take Stairs?: No Driving: No Vocation: Unemployed Comments: take the bus or walk  Current Functional Level Cognition  Arousal/Alertness: Awake/alert Overall Cognitive Status: Within Functional Limits for tasks assessed Orientation Level: Oriented X4    Extremity Assessment (includes Sensation/Coordination)  RUE ROM/Strength/Tone: Within functional levels RUE Sensation: WFL - Light Touch RUE Coordination: WFL - gross/fine motor  RLE ROM/Strength/Tone: WFL for tasks assessed    ADLs  Eating/Feeding: NPO (requesting food) Where Assessed - Eating/Feeding: Chair Grooming: Wash/dry face;Set up Where Assessed - Grooming: Supported sitting Toilet Transfer: Minimal assistance Statistician Method: Sit to Barista: Raised toilet seat with arms (or 3-in-1 over toilet) Equipment Used: Gait belt;Rolling walker Transfers/Ambulation Related  to ADLs: Pt demonstrates deficits with somatosensory (toe lifting off floor, reports "kinda feel it" when asked if he can feel his feet on the floor, and supination of  foot) Pt scissoring with ambulation. Pt following sequence RW Right left RW. Pt with good recall when asked. pt needs constant v/c for hand placement and safety. Pt leaning to the right with mobility. Pt needs rest breaks and prolonged sitting to recover ADL Comments: Pt agreeable to OOB on arrival. pt with good recall of therapist from previous session. Pt continues to request food and currently with panda without running fluids. Pt progressed to EOB with LOB static sitting to the right. Pt able to initiate neutral posture but unable to sustain. Pt with lean to the right after ~15 -30 seconds of static sitting. Pt demonstrates nystagmus of eyes with horizontal beating. Pt today with incr nystagmus and reports blurred vision.     Mobility  Bed Mobility: Supine to Sit Supine to Sit: 4: Min guard Sitting - Scoot to Edge of Bed: 4:  Min guard;With rail Sit to Supine: 4: Min guard;HOB flat    Transfers  Transfers: Sit to Stand;Stand to Sit Sit to Stand: 4: Min assist (v/c to activate lower extremities. facilitate hip extension) Stand to Sit: 3: Mod assist (once initiated, minimal pt control of descent)    Ambulation / Gait / Stairs / Wheelchair Mobility  Ambulation/Gait Ambulation/Gait Assistance: 1: +2 Total assist Ambulation/Gait: Patient Percentage: 70% Ambulation Distance (Feet): 20 Feet Assistive device: Rolling walker Ambulation/Gait Assistance Details: Pt very reliant on walker and lacking weight shift with ambulation.  R knee hyperextension in stance despite adequate strength in quads.  V/c needed to slow down pt. Gait Pattern: Step-to pattern;Decreased stride length;Decreased weight shift to left;Decreased weight shift to right Stairs: No Wheelchair Mobility Wheelchair Mobility: No    Posture / Balance Static Sitting  Balance Static Sitting - Balance Support: Bilateral upper extremity supported Static Sitting - Level of Assistance: 5: Stand by assistance Static Standing Balance Static Standing - Balance Support: Bilateral upper extremity supported Static Standing - Level of Assistance: 4: Min assist;3: Mod assist Dynamic Standing Balance Dynamic Standing - Balance Support: Bilateral upper extremity supported;During functional activity Dynamic Standing - Balance Activities: Reaching for objects;Lateral lean/weight shifting Dynamic Standing - Comments: Weight shif performed in standing position and staggered stance using walker for bilateral UE support 6-8 x each position.  Also incorperated reaching for objects to facilitate weight shift to both sides.  When reaching, unilateral UE support performed 6- x each side.    Special needs/care consideration BiPAP/CPAP No CPM No Continuous Drip IV No Dialysis No        Life Vest No Oxygen No Special Bed No Trach Size No Wound Vac (area) No       Skin No                               Bowel mgmt: Had loose BM 12/08/12 Bladder mgmt: Voiding in urinal Diabetic mgmt No    Previous Home Environment Living Arrangements: Alone Lives With: Alone Type of Home: Other (Comment) (boarding house) Home Layout: One level Home Access: Level entry Bathroom Shower/Tub: Walk-in shower;Door Firefighter: Standard Home Care Services: No  Discharge Living Setting Plans for Discharge Living Setting: Other (Comment) (Can go to cousin's home at discharge.) Type of Home at Discharge: House Discharge Home Layout: One level Discharge Home Access: Stairs to enter Entrance Stairs-Number of Steps: 3-4 Do you have any problems obtaining your medications?: No  Social/Family/Support Systems Contact Information: Haze Boyden - cousin Anticipated Caregiver: cousin and other family Anticipated Caregiver's Contact Information: Crystal [phone] Ability/Limitations of  Caregiver: Wonda Olds works but can take time off.  Cousin will talk to other family to arrange caregiver support Caregiver Availability: Other (Comment) (Cousin working on 24 hr supervision.) Discharge Plan Discussed with Primary Caregiver: Yes Is Caregiver In Agreement with Plan?: Yes Does Caregiver/Family have Issues with Lodging/Transportation while Pt is in Rehab?: No  Goals/Additional Needs Patient/Family Goal for Rehab: PT S, OT S/Min A, ST S goals Expected length of stay: 2 weeks Cultural Considerations: Christian Dietary Needs: NPO with panda tube feeds Equipment Needs: TBD Pt/Family Agrees to Admission and willing to participate: Yes Program Orientation Provided & Reviewed with Pt/Caregiver Including Roles  & Responsibilities: Yes (I spoke with patient's cousin, Crystal Graves.)   Decrease burden of Care through IP rehab admission: Diet advancement and Patient/family education   Possible need for SNF placement upon discharge:  Yes.  If cousin cannot pull caregiver plan together.   Patient Condition:  The patient's condition remains as documented in the consult dated 12/07/12, in which the Rehabilitation Physician determined and documented that the patient's condition is appropriate for intensive rehabilitative care in an inpatient rehabilitation facility.  Will admit to inpatient rehab today.  Preadmission Screen Completed By:  Trish Mage, 12/09/2012 2:12 PM ______________________________________________________________________   Discussed status with Dr. Riley Kill on 12/09/12 at 1400 and received telephone approval for admission today.  Admission Coordinator:  Trish Mage, time1430/Date05/02/14

## 2012-12-09 NOTE — Progress Notes (Signed)
Stroke Team Progress Note  HISTORY [name] is an 62 y.o. male who was seen by his family to have acute left sided weakness with slurred speech at 1730 today, 12/05/2012. EMS arrived, SBP~220. Patient complained of concurrent headache. He does have hypertension and did take his medications today. En route EMS states that his symptoms were resolving although his blood pressure remained high. He does remain sleepy but easy to awake. Patient was not initially felt to be a TPA candidate secondary to NIHSS=1. He was admitted for further evaluation and treatment.  Re-evaluation in the ED: MRI shows a right cerebellar and right corpus callossal acute infarcts. MRA shows nonvisualized right vertebral. Repeat examination shows mild right ptosis, right nystagmus, ?mild right facial droop, slurred speech (felt to be baseline), normal symmetric strength and no evidence of dysmetria. Plantars downgoing.  BP on return from MRI is 238/122. 20mg  of Labetolol given with repeat BP of 211/112. Heart rate responding. Patient ordered to start a Cardene drip. SBP to be maintained at 180-190.   SUBJECTIVE No family at bedside. Panda tube is out, hanging on the pole beside the bed.  OBJECTIVE Most recent Vital Signs: Filed Vitals:   12/09/12 0500 12/09/12 0501 12/09/12 0700 12/09/12 0950  BP:  132/73  123/80  Pulse:  86 90 89  Temp:  97.6 F (36.4 C)  97.8 F (36.6 C)  TempSrc:  Oral  Oral  Resp:  18 18 18   Height:      Weight: 76.703 kg (169 lb 1.6 oz)     SpO2:  96% 98% 98%   CBG (last 3)   Recent Labs  12/09/12 0109 12/09/12 0503 12/09/12 1127  GLUCAP 109* 123* 102*   IV Fluid Intake:   . feeding supplement (JEVITY 1.2 CAL) 65 mL (12/08/12 1400)   MEDICATIONS  . antiseptic oral rinse  15 mL Mouth Rinse q12n4p  . atorvastatin  80 mg Oral q1800  . chlorhexidine  15 mL Mouth Rinse BID  . clopidogrel  75 mg Oral Daily  . enoxaparin (LOVENOX) injection  40 mg Subcutaneous Q24H  . free water   160 mL Per Tube Q6H  . hydrochlorothiazide  25 mg Oral Daily  . lisinopril  10 mg Oral Daily  . pantoprazole (PROTONIX) IV  40 mg Intravenous Q24H  . potassium chloride  40 mEq Oral BID  . sodium chloride  3 mL Intravenous Q12H  . tiotropium  18 mcg Inhalation Daily   PRN:  sodium chloride, albuterol, hydrALAZINE, ondansetron, sodium chloride  Diet:  NPO  Activity:  up with assistance DVT Prophylaxis:  Lovenox 40 mg sq daily   CLINICALLY SIGNIFICANT STUDIES Basic Metabolic Panel:   Recent Labs Lab 12/06/12 0229  12/07/12 0345 12/08/12 0525 12/09/12 0600  NA 138  < > 138 139 138  K 3.8  < > 3.6 3.4* 3.8  CL 103  < > 105 103 103  CO2 24  < > 26 25 26   GLUCOSE 143*  < > 95 112* 118*  BUN 8  < > 8 17 26*  CREATININE 0.71  < > 0.75 1.03 1.20  CALCIUM 9.7  < > 9.7 9.9 10.1  MG 1.7  --  2.0 2.2  --   PHOS 3.2  --  2.9  --   --   < > = values in this interval not displayed. Liver Function Tests:   Recent Labs Lab 12/05/12 1851  AST 40*  ALT 36  ALKPHOS 87  BILITOT  0.3  PROT 8.3  ALBUMIN 3.2*   CBC:   Recent Labs Lab 12/05/12 1851 12/05/12 1855 12/06/12 0820  WBC 9.4  --  10.4  NEUTROABS 3.2  --  7.1  HGB 13.3 13.9 14.1  HCT 37.4* 41.0 39.1  MCV 83.5  --  84.1  PLT 275  --  279   Coagulation:   Recent Labs Lab 12/05/12 1851  LABPROT 13.0  INR 0.99   Cardiac Enzymes:   Recent Labs Lab 12/05/12 2128 12/06/12 0229 12/06/12 0820  TROPONINI <0.30 <0.30 <0.30   Urinalysis:   Recent Labs Lab 12/05/12 2207  COLORURINE YELLOW  LABSPEC 1.016  PHURINE 5.5  GLUCOSEU NEGATIVE  HGBUR MODERATE*  BILIRUBINUR NEGATIVE  KETONESUR NEGATIVE  PROTEINUR NEGATIVE  UROBILINOGEN 0.2  NITRITE NEGATIVE  LEUKOCYTESUR NEGATIVE   Lipid Panel    Component Value Date/Time   CHOL 146 12/06/2012 0820   TRIG 103 12/06/2012 0820   HDL 47 12/06/2012 0820   CHOLHDL 3.1 12/06/2012 0820   VLDL 21 12/06/2012 0820   LDLCALC 78 12/06/2012 0820   HgbA1C  Lab Results   Component Value Date   HGBA1C 5.5 12/05/2012    Urine Drug Screen:     Component Value Date/Time   LABOPIA NONE DETECTED 12/05/2012 2207   LABOPIA NEGATIVE 10/14/2010 2140   COCAINSCRNUR NONE DETECTED 12/05/2012 2207   COCAINSCRNUR  Value: POSITIVE (NOTE) Result repeated and verified. Sent for confirmatory testing* 10/14/2010 2140   LABBENZ NONE DETECTED 12/05/2012 2207   LABBENZ NEGATIVE 10/14/2010 2140   AMPHETMU NONE DETECTED 12/05/2012 2207   AMPHETMU NEGATIVE 10/14/2010 2140   THCU NONE DETECTED 12/05/2012 2207   LABBARB NONE DETECTED 12/05/2012 2207    Alcohol Level:   Recent Labs Lab 12/05/12 1851  ETH 81*   CT of the brain   12/07/2012 Stable appearance of the areas of focal ischemic change in the genu of the corpus callosum on the right and in the right inferior cerebellum. Mild sulci effacement without midline shift. No evidence of acute intracranial hemorrhage. 12/05/2012  Atrophy and chronic ischemic white matter disease without acute intracranial abnormality.  Chronic left paranasal sinus disease.   MRI of the brain  12/05/2012   Acute or subacute abnormality at the genu of the corpus callosum more to the right of midline.  This is most consistent with infarction.  The differential diagnosis does include tumor, but that is felt considerably less likely.  Probable acute infarction at the inferior cerebellum on the right. Loss of flow in the right vertebral artery since the previous study.  Extensive chronic small vessel disease elsewhere throughout the brain.  Old cortical and subcortical infarction at the vertex on the right.    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 Only a small amount of retrograde flow in the distal part of that vessel.  The left vertebral artery remains widely patent to the basilar.  Marked progression of atherosclerotic disease throughout the anterior and middle cerebral branches bilaterally, worse on the left  than the right.      2D Echocardiogram  EF 65%, no ASD or PFO identified.  Carotid Doppler  No significant extracranial carotid artery stenosis demonstrated. Vertebrals are patent with antegrade flow.  TCD Highly suboptimal and limited study due to absent bitemporal and occipital windows. Antegrade flow inbilateral opthalmic and right carotid arteries.  CXR  12/05/2012 Suboptimal inspiration. No acute cardiopulmonary disease.   EKG  normal  sinus rhythm, RBBB, 1st degree AV block.   Therapy Recommendations SNF  Physical Exam   Elderly african [place] male not in distress.Awake alert. Afebrile. Head is nontraumatic. Neck is supple without bruit. Hearing is normal. Cardiac exam no murmur or gallop. Lungs are clear to auscultation. Distal pulses are well felt. Neurological Exam ; awake alert oriented x3. Moderate dysarthria but can be understood. Eyes and primary position extraocular movements are full range with nystagmus on lateral gaze right more than left. Pupils are equal reactive. Fundi were not visualized. There is severe right lower face weakness. Tongue is midline. Cough and gag aren't week. No upper or lower eczema to drift. Diminished fine finger movements on the left. Finger-to-nose dysmetria on the right upper extremity. No focal weakness. Touch and pinprick sensation are preserved. Deep tendon pulses are 2+ symmetric. Plantars are downgoing.  ASSESSMENT [name] is a 62 y.o. male presenting with left sided weakness and slurred speech. Imaging confirms a right  genu of the corpus callosum, right inferior cerebellum infarct. Also, nonvisualized right vertebral artery. Etiology of Infarct intracranial atherosclerosis. On clopidogrel 75 mg orally every day prior to admission. Now on clopidogrel 75 mg orally every day for secondary stroke prevention. Patient with resultant right facial weakness, right ptosis, dysphagia.  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 Patient is allergic to aspirin.   Failed swallow: NPO, with NG currently out. Plans are to resume  CIR planned when bed available  SBP goal < 200, DBP < 110 No further stroke workup indicated. Patient has a 10-15% risk of having another stroke over the next year, the highest risk is within 2 weeks of the most recent stroke/TIA (risk of having a stroke following a stroke or TIA is the same). Ongoing risk factor control  Stroke Service will sign off. Please call should any needs arise. Follow up with Dr. Pearlean Brownie, Stroke Clinic, in 2 months.  Annie Main, MSN, RN, ANVP-BC, ANP-BC, [facility] Pager: [phone] 12/09/2012 11:48 AM  I have personally obtained a history, examined the patient, evaluated imaging results, and formulated the assessment and plan of care. I agree with the above. Delia Heady, MD

## 2012-12-09 NOTE — Discharge Summary (Signed)
Physician Discharge Summary  [name]:[id number] DOB: [birth year] DOA: 12/05/2012  PCP: Lollie Sails, MD  Admit date: 12/05/2012 Discharge date: 12/09/2012  Time spent: 35 minutes  Recommendations for Outpatient Follow-up:  1. Follow up with Dr. Pearlean Brownie in 5 weeks. 2. Revaluate swallowing at [facility].  Discharge Diagnoses:  Principal Problem:   Cerebral embolism with cerebral infarction Active Problems:   Elevated PSA   Smokes tobacco daily   Malignant hypertension   Dyslipidemia   Alcohol abuse   Hypokalemia   Hypertensive emergency   Stroke, acute, embolic   Discharge Condition: stable  Diet recommendation: NPO  Filed Weights   12/06/12 0145 12/08/12 0621 12/09/12 0500  Weight: 78.2 kg (172 lb 6.4 oz) 75.3 kg (166 lb 0.1 oz) 76.703 kg (169 lb 1.6 oz)    History of present illness:  patient had sudden onset of witnessed slurred speech accompanied by 9/10 generalized headache, left sided weakness (with left weak hand grip) and unsteady gait. En route patient begun to lean towards right side per ED documentation. The patient admits to dizziness (now improved) which brought him into the ED associated with generalized 9/10 headache.    Hospital Course:  Hypertensive emergency/ Malignant hypertension  - started on admission on nicardipine drip titrated off.  - currently on lisinopril and HCTZ  - BP <160/90.  Stroke, acute, embolic  - MRI 4.28.2014: Acute or subacute abnormality at the genu of the corpus callosum more to the right of midline. T  - neurology consulted. Recommended plavix.  - failed swallowing evaluation 4.29.2014. Speech relates after re-evaluating on him on 5.2.2014,  his speech had significantly improved, will benefit from PANDA and re-evalaute at CIR. - CIR consult rec in=patient rehab. - ECHO 4.29.2014: estimated ejection fraction was in the range of 65% to 70%  - carotid doppler 4.29.2014: showed no ICA stenosis.  - statins.   Dyslipidemia  -  statins   Alcohol abuse:  - thiamine and folate.  - moniotr with CIWA.   Hypokalemia  - replete check a mag   Smokes tobacco daily - counseling  Procedures:  MRI/MRA 4.28.2014  ECHO  Doppler 4.29.2014   Consultations:  [facility]  Rehab  Discharge Exam: Filed Vitals:   12/09/12 0108 12/09/12 0500 12/09/12 0501 12/09/12 0700  BP: 135/83  132/73   Pulse: 80  86 90  Temp: 97.5 F (36.4 C)  97.6 F (36.4 C)   TempSrc: Oral  Oral   Resp: 20  18 18   Height:      Weight:  76.703 kg (169 lb 1.6 oz)    SpO2: 95%  96% 98%    General: A&O x3 Cardiovascular: RRR Respiratory: good air movement CTA B/L  Discharge Instructions  Discharge Orders   Future Orders Complete By Expires     Diet - low sodium heart healthy  As directed     Increase activity slowly  As directed         Medication List    STOP taking these medications       pravastatin 80 MG tablet  Commonly known as:  PRAVACHOL      TAKE these medications       albuterol 108 (90 BASE) MCG/ACT inhaler  Commonly known as:  PROVENTIL HFA;VENTOLIN HFA  Inhale 2 puffs into the lungs every 6 (six) hours as needed for wheezing.     antiseptic oral rinse Liqd  15 mLs by Mouth Rinse route 2 times daily at 12 noon and 4 pm.  atorvastatin 80 MG tablet  Commonly known as:  LIPITOR  Take 1 tablet (80 mg total) by mouth daily at 6 PM.     clopidogrel 75 MG tablet  Commonly known as:  PLAVIX  Take 1 tablet (75 mg total) by mouth daily.     free water Soln  Place 160 mLs into feeding tube every 6 (six) hours.     hydrochlorothiazide 25 MG tablet  Commonly known as:  HYDRODIURIL  Take 1 tablet (25 mg total) by mouth daily.     lisinopril 10 MG tablet  Commonly known as:  PRINIVIL,ZESTRIL  Take 1 tablet (10 mg total) by mouth daily.     tiotropium 18 MCG inhalation capsule  Commonly known as:  SPIRIVA HANDIHALER  Place 1 capsule (18 mcg total) into inhaler and inhale daily.       Allergies   Allergen Reactions  . Aspirin Hives  . Penicillins Hives       Follow-up Information   Follow up with SETHI,PRAMODKUMAR P, MD In 6 weeks. (hospital follow up)    Contact information:   [address] [place] [address] [phone]        The results of significant diagnostics from this hospitalization (including imaging, microbiology, ancillary and laboratory) are listed below for reference.    Significant Diagnostic Studies: Ct Head Wo Contrast  12/07/2012  *RADIOLOGY REPORT*  Clinical Data: History of cerebellar infarct with development of unequal pupils.  CT HEAD WITHOUT CONTRAST  Technique:  Contiguous axial images were obtained from the base of the skull through the vertex without contrast.  Comparison: MRI brain 12/05/2012.  CT head 12/05/2012.  Findings: There is low attenuation change in the region of the genu of the corpus callosum corresponding to the area of acute infarct seen on the MRI examination.  Low attenuation change in the right inferior cerebellum consistent with infarct, also demonstrated on prior MRI.  There is cerebellar sulci effacement.  The fourth ventricle and basal cisterns remain without effacement.  Mild cerebellar tonsillar ectopia.  Chronic-appearing cerebral atrophy. Low attenuation changes in the deep white matter consistent with small vessel ischemia.  No ventricular dilatation.  No midline shift.  No abnormal extra-axial fluid collections.  No acute intracranial hemorrhage.  Opacification of the left maxillary antrum again demonstrated.  IMPRESSION: Stable appearance of the areas of focal ischemic change in the genu of the corpus callosum on the right and in the right inferior cerebellum.  Mild sulci effacement without midline shift.  No evidence of acute intracranial hemorrhage.   Original Report Authenticated By: Burman Nieves, M.D.    Ct Head Wo Contrast  12/05/2012  *RADIOLOGY REPORT*  Clinical Data: Stroke.  Left-sided weakness.   Slurred speech. Abnormal gait.  CT HEAD WITHOUT CONTRAST  Technique:  Contiguous axial images were obtained from the base of the skull through the vertex without contrast.  Comparison: MRI 10/14/2010.  CT 10/14/2010.  Findings: No mass lesion, mass effect, midline shift, hydrocephalus, hemorrhage.  No acute territorial cortical ischemia/infarct. Atrophy and chronic ischemic white matter disease is present.  Intracranial atherosclerosis is present.  Gray-white differentiation appears preserved.  There is a new tiny lacunar infarct adjacent to the frontal horn of the right lateral ventricle compared to prior MRI and CT.  Complete opacification of the left maxillary sinus is chronic.  There is also left maxillary mucoperiosteal thickening.  The other paranasal sinuses appear within normal limits.  Mastoid air cells within normal limits. High right frontal subcutaneous  scalp lesion is present measuring 14 mm, nonspecific.  IMPRESSION: Atrophy and chronic ischemic white matter disease without acute intracranial abnormality.  Chronic left paranasal sinus disease.  Critical Value/emergent results were called by telephone at the time of interpretation on 12/05/2012 at 1905 hours to Dr. Leroy Kennedy who verbally acknowledged these results.   Original Report Authenticated By: Andreas Newport, M.D.    Mr [facility] Wo Contrast  12/05/2012  *RADIOLOGY REPORT*  Clinical Data:  Left-sided weakness.  MRI HEAD WITHOUT CONTRAST MRA HEAD WITHOUT CONTRAST  Technique:  Multiplanar, multiecho pulse sequences of the brain and surrounding structures were obtained without intravenous contrast. Angiographic images of the head were obtained using MRA technique without contrast.  Comparison:   None  MRI HEAD  Findings:  There is swelling and restricted diffusion in the region of the genu of the corpus callosum more towards the right.  This probably represents an acute/subacute infarction.  The differential diagnosis does include neoplastic disease,  but that is felt less likely. There is also probably an acute infarction at the inferior cerebellum on the right.  I have some concern that this could be artifactual, but there is loss of flow in the right vertebral artery since the previous study, further evidence of this is probably an acute infarction.  There is an old inferior cerebellar infarction on the right.  Elsewhere, the patient has chronic small vessel disease throughout the white matter and an old cortical and subcortical infarction at the right parietal vertex.  No evidence of hemorrhage, hydrocephalus or extra-axial collection.  IMPRESSION: Acute or subacute abnormality at the genu of the corpus callosum more to the right of midline.  This is most consistent with infarction.  The differential diagnosis does include tumor, but that is felt considerably less likely.  Probable acute infarction at the inferior cerebellum on the right. Loss of flow in the right vertebral artery since the previous study.  Extensive chronic small vessel disease elsewhere throughout the brain.  Old cortical and subcortical infarction at the vertex on the right.  MRA HEAD  Findings: Both internal carotid arteries are patent into the brain. There is atherosclerotic irregularity in the siphon region with narrowing, worse on the left than the right.  The anterior and middle cerebral vessels show flow, but there are multiple stenoses that have worsened considerably since the previous study.  The left vertebral artery remains patent to the basilar.  The right vertebral artery is now occluded except for a small amount of retrograde flow distally.  No basilar stenosis.  Superior cerebellar and posterior cerebral arteries continue to show flow.  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 Only a small amount of retrograde flow in the distal part of that vessel.  The left vertebral artery remains widely patent to the basilar.  Marked progression of  atherosclerotic disease throughout the anterior and middle cerebral branches bilaterally, worse on the left than the right.   Original Report Authenticated By: Paulina Fusi, M.D.    [name]  12/05/2012  *RADIOLOGY REPORT*  Clinical Data:  Left-sided weakness.  MRI HEAD WITHOUT CONTRAST MRA HEAD WITHOUT CONTRAST  Technique:  Multiplanar, multiecho pulse sequences of the brain and surrounding structures were obtained without intravenous contrast. Angiographic images of the head were obtained using MRA technique without contrast.  Comparison:   None  MRI HEAD  Findings:  There is swelling and restricted diffusion in the region of the genu of the corpus callosum more towards  the right.  This probably represents an acute/subacute infarction.  The differential diagnosis does include neoplastic disease, but that is felt less likely. There is also probably an acute infarction at the inferior cerebellum on the right.  I have some concern that this could be artifactual, but there is loss of flow in the right vertebral artery since the previous study, further evidence of this is probably an acute infarction.  There is an old inferior cerebellar infarction on the right.  Elsewhere, the patient has chronic small vessel disease throughout the white matter and an old cortical and subcortical infarction at the right parietal vertex.  No evidence of hemorrhage, hydrocephalus or extra-axial collection.  IMPRESSION: Acute or subacute abnormality at the genu of the corpus callosum more to the right of midline.  This is most consistent with infarction.  The differential diagnosis does include tumor, but that is felt considerably less likely.  Probable acute infarction at the inferior cerebellum on the right. Loss of flow in the right vertebral artery since the previous study.  Extensive chronic small vessel disease elsewhere throughout the brain.  Old cortical and subcortical infarction at the vertex on the right.  MRA  HEAD  Findings: Both internal carotid arteries are patent into the brain. There is atherosclerotic irregularity in the siphon region with narrowing, worse on the left than the right.  The anterior and middle cerebral vessels show flow, but there are multiple stenoses that have worsened considerably since the previous study.  The left vertebral artery remains patent to the basilar.  The right vertebral artery is now occluded except for a small amount of retrograde flow distally.  No basilar stenosis.  Superior cerebellar and posterior cerebral arteries continue to show flow.  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 Only a small amount of retrograde flow in the distal part of that vessel.  The left vertebral artery remains widely patent to the basilar.  Marked progression of atherosclerotic disease throughout the anterior and middle cerebral branches bilaterally, worse on the left than the right.   Original Report Authenticated By: Paulina Fusi, M.D.    Dg Chest Port 1 View  12/05/2012  *RADIOLOGY REPORT*  Clinical Data: Altered mental status.  Hypertension.  PORTABLE CHEST - 1 VIEW  Comparison: 12/05/2009.  Findings: Monitoring leads are projected over the chest. Cardiopericardial silhouette within normal limits.  No focal consolidation is identified.  Tortuous thoracic aorta.  Lung volumes are lower than on prior exam.  No effusion.  IMPRESSION: Suboptimal inspiration.  No acute cardiopulmonary disease.   Original Report Authenticated By: Andreas Newport, M.D.    Dg Abd Portable 1v  12/06/2012  *RADIOLOGY REPORT*  Clinical Data: Feeding tube placement.  PORTABLE ABDOMEN - 1 VIEW  Comparison: None.  Findings: Weighted tip feeding tube is present with the tip in the mid gastric fundus.  Small amount of oral contrast is present opacifying the folds of the stomach proximally.  The atherosclerosis noted.  Bowel gas pattern appears normal.  IMPRESSION: Feeding tube tip in the  gastric fundus.   Original Report Authenticated By: Andreas Newport, M.D.    Dg Swallowing Func-speech Pathology  12/06/2012  Carolan Shiver, CCC-SLP     12/06/2012 11:46 AM Objective Swallowing Evaluation: Modified Barium Swallowing Study   Patient Details  Name: STANELY SEXSON MRN: [id number] Date of Birth: 1950-12-26  Today's Date: 12/06/2012 Time: 7829-5621 SLP Time Calculation (min): 30 min  Past Medical History:  Past Medical History  Diagnosis  Date  . Hypertension 05/18/2007  . COPD 05/18/2007  . Elevated PSA 05/25/2007  . 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 Stroke 12/05/2012   Past Surgical History:  Past Surgical History  Procedure Laterality Date  . 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 Bedside swallow eval resulted in recs  for NPO and MBS.       Assessment / Plan / Recommendation Clinical Impression  Dysphagia Diagnosis: Severe pharyngeal phase dysphagia  Clinical impression: Pt presents with a severe primary pharyngeal  phase dysphagia.  There was minimal mobility of the hyolaryngeal  apparatus, leading to marginal movement of the epiglottis over  the airway, and significantly impaired opening of the UES. There  was also severely reduced pharyngeal wall movement and asymmetry  of the pharynx, presumably on the right.  These motor deficits  led to severe PO residuals which sat in hypopharynx and  penetrated larynx. Pt was unable to achieve sufficient mobility  to propel material into UES.  Coughing was not effective in  removing residuals.   Study was discontinued, oral suctioning was provided in an effort  to remove residuals, pt desaturated into the 60s and O2 was  immediately provided. Sp02 then increased into mid 90s.   Pt is high aspiration risk for POs and secretions.   Recommend  NPO with consideration of  temporary enteral feeding.  Will  initiate dysphagia therapy and follow for  improvements.    Treatment Recommendation  Therapeutic exercises    Diet Recommendation Alternative means - temporary   Medication Administration: Via alternative means    Other  Recommendations Recommended Consults: MBS Oral Care Recommendations: Oral care QID   Follow Up Recommendations   (pending PT/OT evals)    Frequency and Duration min 3x week  2 weeks   Pertinent Vitals/Pain No pain    SLP Swallow Goals Goal #3: Pt will execute effortful swallow five times per session  with max assist Goal #4: Pt will execute pharyngeal strengthening exercises with  min cues 10x/session   General Date of Onset: 12/05/12 HPI: 62 year old male admitted with acute right cerebellar and  right corpus callossal infarcts. Bedside swallow eval resulted in  recs for NPO and MBS.   Type of Study: Modified Barium Swallowing Study Reason for Referral: Objectively evaluate swallowing function Previous Swallow Assessment: bedside swallow eval same date Diet Prior to this Study: NPO Temperature Spikes Noted: No Respiratory Status: Room air History of Recent Intubation: No Behavior/Cognition: Alert;Cooperative;Pleasant mood Oral Motor / Sensory Function: Impaired - see Bedside swallow  eval Self-Feeding Abilities: Able to feed self Patient Positioning: Upright in chair Baseline Vocal Quality: Wet;Low vocal intensity Volitional Cough: Strong Volitional Swallow: Able to elicit Anatomy: Within functional limits Pharyngeal Secretions: Not observed secondary MBS    Reason for Referral Objectively evaluate swallowing function   Oral Phase Oral Preparation/Oral Phase Oral Phase: Impaired Oral - Nectar Oral - Nectar Teaspoon: Right anterior bolus loss   Pharyngeal Phase Pharyngeal Phase Pharyngeal Phase: Impaired Pharyngeal - Nectar Pharyngeal - Nectar Teaspoon: Delayed swallow initiation;Reduced  pharyngeal peristalsis;Reduced epiglottic inversion;Reduced  laryngeal elevation;Reduced airway/laryngeal  closure;Penetration/Aspiration before   swallow;Penetration/Aspiration during  swallow;Penetration/Aspiration after swallow Penetration/Aspiration details (nectar teaspoon): Material enters  airway, remains ABOVE vocal cords and not ejected out Pharyngeal - Solids Pharyngeal - Puree: Delayed swallow initiation;Reduced pharyngeal  peristalsis;Reduced epiglottic inversion;Reduced laryngeal  elevation;Reduced airway/laryngeal  closure;Penetration/Aspiration  before swallow;Penetration/Aspiration during  swallow;Penetration/Aspiration after swallow Penetration/Aspiration details (puree): Material enters airway,  remains ABOVE vocal cords and not ejected out  Cervical Esophageal Phase       Amanda L. Foyil, [place] CCC/SLP Pager [phone]  Cervical Esophageal Phase Cervical Esophageal Phase: Impaired Cervical Esophageal Phase - Nectar Nectar Teaspoon: Esophageal backflow into the pharynx         [name] [name] 12/06/2012, 11:41 AM      Microbiology: Recent Results (from the past 240 hour(s))  MRSA PCR SCREENING     Status: None   Collection Time    12/06/12  1:38 AM      Result Value Range Status   MRSA by PCR NEGATIVE  NEGATIVE Final   Comment:            The GeneXpert MRSA Assay (FDA     approved for NASAL specimens     only), is one component of a     comprehensive MRSA colonization     surveillance program. It is not     intended to diagnose MRSA     infection nor to guide or     monitor treatment for     MRSA infections.     Labs: Basic Metabolic Panel:  Recent Labs Lab 12/05/12 1855 12/06/12 0229 12/06/12 0820 12/07/12 0345 12/08/12 0525 12/09/12 0600  NA 143 138 134* 138 139 138  K 3.1* 3.8 4.1 3.6 3.4* 3.8  CL 108 103 101 105 103 103  CO2  --  24 24 26 25 26   GLUCOSE 126* 143* 105* 95 112* 118*  BUN 7 8 8 8 17  26*  CREATININE 1.20 0.71 0.69 0.75 1.03 1.20  CALCIUM  --  9.7 9.9 9.7 9.9 10.1  MG  --  1.7  --  2.0 2.2  --   PHOS  --  3.2  --  2.9  --   --    Liver Function Tests:  Recent Labs Lab  12/05/12 1851  AST 40*  ALT 36  ALKPHOS 87  BILITOT 0.3  PROT 8.3  ALBUMIN 3.2*   No results found for this basename: LIPASE, AMYLASE,  in the last 168 hours No results found for this basename: AMMONIA,  in the last 168 hours CBC:  Recent Labs Lab 12/05/12 1851 12/05/12 1855 12/06/12 0820  WBC 9.4  --  10.4  NEUTROABS 3.2  --  7.1  HGB 13.3 13.9 14.1  HCT 37.4* 41.0 39.1  MCV 83.5  --  84.1  PLT 275  --  279   Cardiac Enzymes:  Recent Labs Lab 12/05/12 1851 12/05/12 2128 12/06/12 0229 12/06/12 0820  TROPONINI <0.30 <0.30 <0.30 <0.30   BNP: BNP (last 3 results) No results found for this basename: PROBNP,  in the last 8760 hours CBG:  Recent Labs Lab 12/05/12 2040 12/07/12 2210 12/08/12 2045 12/09/12 0109 12/09/12 0503  GLUCAP 99 98 107* 109* 123*       Signed:  FELIZ ORTIZ, ABRAHAM  [facility] 12/09/2012, 9:53 AM

## 2012-12-09 NOTE — Progress Notes (Signed)
Rehab admissions - A bed has become available on inpatient rehab and can admit to inpatient rehab today.  Call me for questions.  #[phone]

## 2012-12-09 NOTE — Interval H&P Note (Signed)
[name] was admitted today to Inpatient Rehabilitation with the diagnosis of right cerebellar and corpus callosum infarcts.  The patient's history has been reviewed, patient examined, and there is no change in status.  Patient continues to be appropriate for intensive inpatient rehabilitation.  I have reviewed the patient's chart and labs.  Questions were answered to the patient's satisfaction.  SWARTZ,ZACHARY T 12/09/2012, 9:15 PM

## 2012-12-10 ENCOUNTER — Inpatient Hospital Stay (HOSPITAL_COMMUNITY): Payer: Medicaid Other | Admitting: *Deleted

## 2012-12-10 ENCOUNTER — Inpatient Hospital Stay (HOSPITAL_COMMUNITY): Payer: Medicaid Other

## 2012-12-10 ENCOUNTER — Inpatient Hospital Stay (HOSPITAL_COMMUNITY): Payer: Medicaid Other | Admitting: Physical Therapy

## 2012-12-10 ENCOUNTER — Inpatient Hospital Stay (HOSPITAL_COMMUNITY): Payer: Medicaid Other | Admitting: Speech Pathology

## 2012-12-10 LAB — GLUCOSE, CAPILLARY
Glucose-Capillary: 94 mg/dL (ref 70–99)
Glucose-Capillary: 83 mg/dL (ref 70–99)
Glucose-Capillary: 91 mg/dL (ref 70–99)
Glucose-Capillary: 105 mg/dL — ABNORMAL HIGH (ref 70–99)

## 2012-12-10 MED ORDER — JEVITY 1.2 CAL PO LIQD
1000.0000 mL | ORAL | Status: DC
  Administered 2012-12-11 – 2012-12-12 (×2)
  Administered 2012-12-12 – 2012-12-13 (×2): 1000 mL
  Filled 2012-12-10 (×11): qty 1000

## 2012-12-10 MED ORDER — IOHEXOL 300 MG/ML  SOLN: 20.0000 mL | Freq: Once | INTRAMUSCULAR | Status: AC | PRN

## 2012-12-10 NOTE — Progress Notes (Signed)
Patient ID: [name], male   DOB: 1951/01/01, 62 y.o.   MRN: [id number] Subjective/Complaints: 62 y.o. RH-male with history of HTN, COPD, prior CVA; who was admitted on 12/05/12 with acute onset of left hand weakness, unsteady gait and slurred speech. Noted to have elevated SBP-220. Patient reported falls x 3 and dizziness earlier that day. 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 Work up initiated and ST evaluation reveals signs of moderate to severe dysphagia with difficulty handling oral secretions therefore NPO recommended with alternative means of nutrition for now. Panda placed but patient continues to ask for po's.Carotid dopplers without ICA stenosis. 2D echo done revealing EF 65-70% with grade 1 diastolic dysfunction. PT/OT evaluations done revealing patient with poor spacial awareness with decreased vision, ataxia and severe balance deficits  RN tried putting in PANDA feeding tube yesterday without success.  Pt refusing second tube attempt per Nsg.  Is NPO for severe dysphagia after stroke   Review of Systems  Respiratory: Positive for cough.    Objective: Vital Signs: Blood pressure 150/95, pulse 77, temperature 97.7 F (36.5 C), temperature source Oral, resp. rate 17, height 5\' 8"  (1.727 m), weight 74.8 kg (164 lb 14.5 oz), SpO2 96.00%. Dg Chest 2 View  12/10/2012  *RADIOLOGY REPORT*  Clinical Data: Severe dysphagia, cough  CHEST - 2 VIEW  Comparison: Chest x-ray 12/05/2012  Findings: Linear atelectasis in the left base.  Cardiac and mediastinal contours remain within normal limits.  Chronic bronchitic changes again noted.  No edema, pleural effusion, pneumothorax or focal airspace consolidation.  No acute osseous abnormality.  IMPRESSION: Left basilar atelectasis.  Otherwise, no acute cardiopulmonary disease.   Original Report Authenticated By: Malachy Moan, M.D.    Results for orders placed during  the hospital encounter of 12/09/12 (from the past 72 hour(s))  CBC     Status: None   Collection Time    12/09/12  9:27 PM      Result Value Range   WBC 9.4  4.0 - 10.5 K/uL   RBC 4.75  4.22 - 5.81 MIL/uL   Hemoglobin 14.3  13.0 - 17.0 g/dL   HCT 21.3  08.6 - 57.8 %   MCV 86.1  78.0 - 100.0 fL   MCH 30.1  26.0 - 34.0 pg   MCHC 35.0  30.0 - 36.0 g/dL   RDW 46.9  62.9 - 52.8 %   Platelets 305  150 - 400 K/uL  CREATININE, SERUM     Status: Abnormal   Collection Time    12/09/12  9:27 PM      Result Value Range   Creatinine, Ser 1.16  0.50 - 1.35 mg/dL   GFR calc non Af Amer 65 (*) >90 mL/min   GFR calc Af Amer 76 (*) >90 mL/min   Comment:            The eGFR has been calculated     using the CKD EPI equation.     This calculation has not been     validated in all clinical     situations.     eGFR's persistently     <90 mL/min signify     possible Chronic Kidney Disease.  GLUCOSE, CAPILLARY     Status: None   Collection Time    12/09/12 10:18 PM      Result Value Range   Glucose-Capillary 94  70 - 99 mg/dL   Comment  1 Notify RN       HEENT: porr oro-motor movement Cardio: RRR and no murmur Resp: CTA B/L and upper airway congestion GI: BS positive and non tender Extremity:  Pulses positive and No Edema Skin:   Intact Neuro: Alert/Oriented, Cranial Nerve II-XII normal, Abnormal Motor 4-/5 RUE, 4+/5 LUE, 3-/5 B HF, 4/5 B KE,ADF/PF, Abnormal FMC Ataxic/ dec FMC and Dysarthric Musc/Skel:  Normal Gen NAD   Assessment/Plan: 1. Functional deficits secondary to R cerebellar, R corpus callosum infarct which require 3+ hours per day of interdisciplinary therapy in a comprehensive inpatient rehab setting. Physiatrist is providing close team supervision and 24 hour management of active medical problems listed below. Physiatrist and rehab team continue to assess barriers to discharge/monitor patient progress toward functional and medical goals. FIM:                    Comprehension Comprehension Mode: Auditory Comprehension: 6-Follows complex conversation/direction: With extra time/assistive device  Expression Expression Mode: Verbal Expression: 6-Expresses complex ideas: With extra time/assistive device  Social Interaction Social Interaction: 6-Interacts appropriately with others with medication or extra time (anti-anxiety, antidepressant).  Problem Solving Problem Solving: 5-Solves complex 90% of the time/cues < 10% of the time  Memory Memory: 5-Requires cues to use assistive device  Medical Problem List and Plan:  1. DVT Prophylaxis/Anticoagulation: Pharmaceutical: Lovenox  2. Pain Management: N/A  3. Mood: Will have LCSW follow for evaluation.  4. Neuropsych: This patient is not capable of making decisions on his/her own behalf.  5. HTN: Monitor with bid checks. Continue HCTZ and Prinivil for now. May need to re-evaluate diuretic in face of modified diet and acute renal insufficiency.  6. COPD: Continue spiriva. Prn nebs for SOB. Keep HOB elevated at 30 degrees. Encourage IS.  7. Dysphagia: Continue NPO due to severe dysphagia. TF for nutritional support.  Continue to educate on aspiration risk. Replace NGT.Spoke to radiologist who will place today Check CXR to rule out aspiration. . Will change BS to qid to help patient get rest and as BS 98-123 range. SSI for elevated BS.      LOS (Days) 1 A FACE TO FACE EVALUATION WAS PERFORMED  [name],Seve E 12/10/2012, 9:11 AM

## 2012-12-10 NOTE — Evaluation (Signed)
Speech Language Pathology Assessment and Plan  Patient Details  Name: [name] MRN: [id number] Date of Birth: [birth year]  SLP Diagnosis: Dysarthria;Dysphagia  Rehab Potential: Good ELOS: 2 weeks   Today's Date: 12/10/2012 Time: 4098-1191 Time Calculation (min): 60 min  Skilled Therapeutic Intervention: Administered BSE and cognitive-linguistic evaluation. Please see below for details.   Problem List:  Patient Active Problem List   Diagnosis Date Noted  . Hypertensive emergency 12/06/2012  . Stroke, acute, embolic 12/06/2012  . Malignant hypertension 12/05/2012  . Cerebral embolism with cerebral infarction 12/05/2012  . Dyslipidemia 12/05/2012  . Alcohol abuse 12/05/2012  . Hypokalemia 12/05/2012  . Claudication, intermittent 11/07/2012  . Smokes tobacco daily 10/18/2012  . History of stroke 10/14/2010  . Elevated PSA 05/25/2007  . Hypertension 05/18/2007  . COPD 05/18/2007   Past Medical History:  Past Medical History  Diagnosis Date  . Hypertension 05/18/2007  . COPD 05/18/2007  . Elevated PSA 05/25/2007  . 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 Stroke 12/05/2012   Past Surgical History:  Past Surgical History  Procedure Laterality Date  . 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 Limited BSE completed today due to pt getting NG tube replaced today by radiology so PO trials were not performed. Pt required max encouragement and education in order to verbalize willingness to replace NG tube. Pt continues to demonstrate decreased management of secretions with a constant wet vocal quality that could not be cleared despite Max cueing for several strong coughs and throat clears. Pt also demonstrated  constant hiccups throughout the evaluation. Pt also presents with a mild dysarthria characterized by a wet vocal quality with decreased vocal intensity and increased rate of speech impacting pt's overall speech intelligibility at the conversation level. Pt recalled swallowing exercises with Min question cues and performed appropriately with Mod verbal and visual cues. Pt also required Max verbal cues to utilize an increased vocal intensity and decreased rate of speech throughout functional conversation. Pt would benefit from skilled SLP intervention to maximize functional communication and swallowing function for eventual initiation of a PO diet.     SLP Assessment  Patient will need skilled Speech Lanaguage Pathology Services during CIR admission    Recommendations  Recommended Consults: MBS (when clinically appropriate) Diet Recommendations: NPO;Alternative means - temporary Medication Administration: Via alternative means Oral Care Recommendations: Oral care QID Patient destination:  (TBD, home vs. SNF) Follow up Recommendations: 24 hour supervision/assistance (TBD, Home health vs. SNF) Equipment Recommended: None recommended by SLP    SLP Frequency 5 out of 7 days   SLP Treatment/Interventions Dysphagia/aspiration precaution training;Neuromuscular electrical stimulation;Patient/family education;Therapeutic Activities;Oral motor exercises;Speech/Language facilitation;Therapeutic Exercise;Cueing hierarchy;Cognitive remediation/compensation    Pain Pain Assessment Pain Assessment: No/denies pain  Short Term Goals: Week 1: SLP Short Term Goal 1 (Week 1): Pt will self-monitor and correct wet vocal quality with Mod verbal and question cues.  SLP Short Term Goal 2 (Week 1): Pt will perform pharyngeal strengthening exercises with Min verbal cues.  SLP Short Term Goal 3 (Week 1): Pt will consume trials of ice chips with minimal overt s/s of aspiration with 50% of trials with Mod A verbal cues.   SLP Short Term Goal 4 (Week 1): Pt will utilize speech intelligibility strategies with Mod A verbal  and question cues for 75% intelligibility at the conversation level  See FIM for current functional status Refer to Care Plan for Long Term Goals  Recommendations for other services: None  Discharge Criteria: Patient will be discharged from SLP if patient refuses treatment 3 consecutive times without medical reason, if treatment goals not met, if there is a change in medical status, if patient makes no progress towards goals or if patient is discharged from hospital.  The above assessment, treatment plan, treatment alternatives and goals were discussed and mutually agreed upon: by patient  Nikolay Demetriou 12/10/2012, 9:39 AM

## 2012-12-10 NOTE — Progress Notes (Signed)
Physical Therapy Note  Patient Details  Name: [name] MRN: [id number] Date of Birth: 1950-12-04 Today's Date: 12/10/2012  Time: 1300-1325 25 minutes  No c/o pain.  Pt c/o fatigue but willing to participate.  Gait with RW with manual facilitation at hips to encourage L wt shift and verbal cues for increased step length.  Pt continues to require mod A due to LOB to R and posterior.  Standing balance training focusing on B hip strength with wt shifts and perturbations, pt requires min-mod A for static stance, total A for perturbations.  Individual therapy   Rihanna Marseille 12/10/2012, 1:24 PM

## 2012-12-10 NOTE — Progress Notes (Addendum)
INITIAL NUTRITION ASSESSMENT  DOCUMENTATION CODES Per approved criteria  -Not Applicable   INTERVENTION: 1.  Enteral nutrition; transition to cyclic TFs of Jevity 1.2 @ 80 mL/hr x 20 hrs for greater ability to participate in rehab.  Aim for maximum needs estimate due to wt change. New regimen with free water to provide 1920 kcal, 88g protein, 2079 mL free water.   NUTRITION DIAGNOSIS: Inadequate oral intake related to inability to eat as evidenced by NPO.   Goal: Pt to meet >/=90% estimated needs  Monitor:  TF tolerance, transition to cyclic, vent status, wt  Reason for Assessment: TF  62 y.o. male  Admitting Dx: right cerebellar and corpus callosum infarcts  ASSESSMENT: Pt admitted to rehab s/p right cerebellar and corpus callosum infarcts.  Pt remains on TF via NGT due to decreased management of secretions.  Pt followed by SLP who performed limited assessment today and recommended continue alternative means of nutrition while pt progresses with POs.   Pt followed by clinical nutrition staff during recent hospitalization and was able to achieve goal rate of Jevity 1.2 @ 65 mL/hr providing 1872 kcal, 86g protein with tolerance.  Pt denies discomfort.  States he is ready for POs when able.  Pt ready to begin transition to cyclic feeds for rehab participation.   Pt currently ordered free water 200 mL 4 times daily. RD notes pt with recent wt change. Will monitor trends.       Height: Ht Readings from Last 1 Encounters:  12/09/12 5\' 8"  (1.727 m)    Weight: Wt Readings from Last 1 Encounters:  12/09/12 164 lb 14.5 oz (74.8 kg)    Ideal Body Weight: 70 kg  % Ideal Body Weight: 106%  Wt Readings from Last 10 Encounters:  12/09/12 164 lb 14.5 oz (74.8 kg)  12/09/12 169 lb 1.6 oz (76.703 kg)  11/07/12 177 lb 11.2 oz (80.604 kg)  10/18/12 181 lb 8 oz (82.328 kg)  05/25/07 152 lb 14.4 oz (69.355 kg)    Usual Body Weight: 175 lbs  % Usual Body Weight: 93%  BMI:  Body  mass index is 25.08 kg/(m^2).  Estimated Nutritional Needs: Kcal: 1850-2050 Protein: 85-100 Fluid: >1.9 L/day  Skin: intact  Diet Order: NPO  EDUCATION NEEDS: -Education needs addressed   Intake/Output Summary (Last 24 hours) at 12/10/12 1445 Last data filed at 12/10/12 1610  Gross per 24 hour  Intake      0 ml  Output    500 ml  Net   -500 ml    Last BM: 5/3  Labs:   Recent Labs Lab 12/05/12 1855 12/06/12 0229  12/07/12 0345 12/08/12 0525 12/09/12 0600 12/09/12 2127  NA 143 138  < > 138 139 138  --   K 3.1* 3.8  < > 3.6 3.4* 3.8  --   CL 108 103  < > 105 103 103  --   CO2  --  24  < > 26 25 26   --   BUN 7 8  < > 8 17 26*  --   CREATININE 1.20 0.71  < > 0.75 1.03 1.20 1.16  CALCIUM  --  9.7  < > 9.7 9.9 10.1  --   MG  --  1.7  --  2.0 2.2  --   --   PHOS  --  3.2  --  2.9  --   --   --   GLUCOSE 126* 143*  < > 95 112* 118*  --   < > =  values in this interval not displayed.  CBG (last 3)   Recent Labs  12/09/12 2218 12/10/12 0745 12/10/12 1143  GLUCAP 94 83 91    Scheduled Meds: . antiseptic oral rinse  15 mL Mouth Rinse q12n4p  . atorvastatin  80 mg Oral q1800  . chlorhexidine  15 mL Mouth Rinse BID  . clopidogrel  75 mg Oral Daily  . enoxaparin (LOVENOX) injection  40 mg Subcutaneous Q24H  . free water  200 mL Per Tube Q6H  . hydrochlorothiazide  25 mg Oral Daily  . insulin aspart  0-5 Units Subcutaneous QHS  . insulin aspart  0-9 Units Subcutaneous TID WC  . lisinopril  10 mg Oral Daily  . potassium chloride  40 mEq Oral BID  . tiotropium  18 mcg Inhalation Daily    Continuous Infusions: . feeding supplement (JEVITY 1.2 CAL) 65 mL/hr at 12/10/12 1119    Past Medical History  Diagnosis Date  . Hypertension 05/18/2007  . COPD 05/18/2007  . Elevated PSA 05/25/2007  . 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 Stroke 12/05/2012    Past Surgical History  Procedure Laterality Date  . 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

## 2012-12-10 NOTE — Evaluation (Signed)
Physical Therapy Assessment and Plan  Patient Details  Name: [name] MRN: [id number] Date of Birth: 1950-09-01  PT Diagnosis: Abnormality of gait, Ataxic gait and Cognitive deficits Rehab Potential: Good ELOS: 2 weeks   Today's Date: 12/10/2012 Time: [phone] 60 minutes  Problem List:  Patient Active Problem List   Diagnosis Date Noted  . Hypertensive emergency 12/06/2012  . Stroke, acute, embolic 12/06/2012  . Malignant hypertension 12/05/2012  . Cerebral embolism with cerebral infarction 12/05/2012  . Dyslipidemia 12/05/2012  . Alcohol abuse 12/05/2012  . Hypokalemia 12/05/2012  . Claudication, intermittent 11/07/2012  . Smokes tobacco daily 10/18/2012  . History of stroke 10/14/2010  . Elevated PSA 05/25/2007  . Hypertension 05/18/2007  . COPD 05/18/2007    Past Medical History:  Past Medical History  Diagnosis Date  . Hypertension 05/18/2007  . COPD 05/18/2007  . Elevated PSA 05/25/2007  . 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 Stroke 12/05/2012   Past Surgical History:  Past Surgical History  Procedure Laterality Date  . 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 year old male with recent admission to the hospital on 12/05/12 with acute onset of left hand weakness, unsteady gait and slurred speech. Noted to have elevated SBP-220. Patient reported falls x 3 and dizziness earlier that day. 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 Work up initiated and ST evaluation reveals signs of moderate to severe dysphagia with difficulty handling oral secretions therefore NPO recommended with alternative means of nutrition for now. Panda placed but patient continues to ask for po's.Carotid dopplers without ICA stenosis. 2D echo done revealing EF 65-70% with grade 1 diastolic dysfunction.  Patient transferred to CIR on 12/09/2012 .    Patient currently requires mod with mobility secondary to ataxia and decreased coordination, decreased awareness and decreased standing balance, decreased postural control and decreased balance strategies.  Prior to hospitalization, patient was independent  with mobility and lived with Alone (in boarding house with 3 other men) in a House home.  Home access is 2Stairs to enter.  Patient will benefit from skilled PT intervention to maximize safe functional mobility, minimize fall risk and decrease caregiver burden for planned discharge home with intermittent assist.  Anticipate patient will benefit from follow up [facility] at discharge.  PT - End of Session Activity Tolerance: Tolerates 30+ min activity with multiple rests Endurance Deficit: Yes PT Assessment Rehab Potential: Good Barriers to Discharge: Decreased caregiver support PT Plan PT Intensity: Minimum of 1-2 x/day ,45 to 90 minutes PT Frequency: 5 out of 7 days PT Duration Estimated Length of Stay: 2 weeks PT Treatment/Interventions: Ambulation/gait training;Balance/vestibular training;Discharge planning;Functional mobility training;Therapeutic Activities;Wheelchair propulsion/positioning;Therapeutic Exercise;Neuromuscular re-education;Cognitive remediation/compensation;DME/adaptive equipment instruction;Pain management;Splinting/orthotics;UE/LE Strength taining/ROM;UE/LE Coordination activities;Stair training;Patient/family education;Community reintegration PT Recommendation Follow Up Recommendations: Home health PT Patient destination: Home  Skilled Therapeutic Intervention Gait training with HHA vs RW with pt requiring mod A with each due to R bias, LOB R and anteriorly.  Pt continues with small, shuffling steps, decreased step length on R, wide BOS.  Pt reports he feels more comfortable with RW.  NMR standing with focus on anterior and L wt shifting, B hip strengthening statically with min-mod A, max-total A for dynamic standing balance  activities.  PT Evaluation Precautions/Restrictions Precautions Precautions: Fall Precaution Comments:  (uncoord movements; impulsive; decreased safety awareness) Restrictions Weight Bearing  Restrictions: No Pain Pain Assessment Pain Assessment: No/denies pain Home Living/Prior Functioning Home Living Lives With: Alone (in boarding house with 3 other men) Available Help at Discharge: Friend(s) Type of Home: House Home Access: Stairs to enter [employer] of Steps: 2 Entrance Stairs-Rails: None Home Layout: One level Bathroom Shower/Tub: Walk-in shower;Curtain (grab bars) Bathroom Toilet: Standard Bathroom Accessibility: Yes How Accessible: Accessible via wheelchair Home Adaptive Equipment: None Prior Function Level of Independence: Independent with basic ADLs;Independent with homemaking with ambulation Able to Take Stairs?: Yes Driving: No Vision/Perception  Vision - History Baseline Vision: Wears glasses only for reading  Cognition Overall Cognitive Status: Difficult to assess Arousal/Alertness: Awake/alert Orientation Level: Oriented X4 Awareness: Impaired Awareness Impairment: Anticipatory impairment Safety/Judgment: Impaired Comments: decreased awareness Sensation Sensation Light Touch: Appears Intact Stereognosis: Appears Intact Proprioception: Impaired Detail Proprioception Impaired Details: Impaired LLE;Impaired RLE Coordination Gross Motor Movements are Fluid and Coordinated: No Fine Motor Movements are Fluid and Coordinated: No Coordination and Movement Description: ataxic Motor  Motor Motor: Ataxia Motor - Skilled Clinical Observations: hyperkinetic mvmetns  Mobility Bed Mobility Supine to Sit: 6: Modified independent (Device/Increase time) Transfers Sit to Stand: 4: Min assist Sit to Stand Details: Tactile cues for posture;Tactile cues for placement;Verbal cues for precautions/safety;Verbal cues for safe use of DME/AE Sit to Stand  Details (indicate cue type and reason): mod. cues to lock wc Stand to Sit: 4: Min assist Stand to Sit Details (indicate cue type and reason): Verbal cues for precautions/safety;Tactile cues for weight shifting Stand Pivot Transfers: 4: Min assist Locomotion  Ambulation Ambulation: Yes Ambulation/Gait Assistance: 3: Mod assist Ambulation Distance (Feet): 15 Feet Assistive device: None Ambulation/Gait Assistance Details: ataxic B LEs, some buckling of R LE, small, shuffling steps, wide BOS Stairs / Additional Locomotion Stairs: Yes Stairs Assistance: 3: Mod assist Stair Management Technique: Two rails Number of Stairs: 10 Wheelchair Mobility Wheelchair Mobility: Yes Wheelchair Assistance: 5: Investment banker, operational: Both upper extremities Wheelchair Parts Management: Needs assistance Distance: 100  Trunk/Postural Assessment  Cervical Assessment Cervical Assessment: Within Functional Limits Thoracic Assessment Thoracic Assessment: Within Functional Limits Lumbar Assessment Lumbar Assessment: Within Functional Limits Postural Control Postural Control: Deficits on evaluation Trunk Control: ataxic trunk Righting Reactions: delayed Postural Limitations: dec. trunk strength for reaching  Balance Balance Balance Assessed: Yes Static Sitting Balance Static Sitting - Balance Support: Left upper extremity supported;Feet supported Static Sitting - Level of Assistance: 6: Modified independent (Device/Increase time) Dynamic Sitting Balance Dynamic Sitting - Balance Support: Feet supported;Right upper extremity supported Dynamic Sitting - Level of Assistance: 5: Stand by assistance Dynamic Sitting Balance - Compensations: grab bars, counter for reaching feet, floor Dynamic Sitting - Balance Activities: Reaching for objects;Reaching across midline Static Standing Balance Static Standing - Balance Support: Bilateral upper extremity supported Static Standing - Level of  Assistance: 4: Min assist Dynamic Standing Balance Dynamic Standing - Balance Support: During functional activity Dynamic Standing - Balance Activities: Forward lean/weight shifting;Lateral lean/weight shifting Dynamic Standing - Comments: max A Extremity Assessment  RUE Assessment RUE Assessment: Exceptions to [facility] RUE Strength RUE Overall Strength: Deficits LUE Assessment LUE Assessment: Exceptions to [facility] LUE Strength LUE Overall Strength: Due to premorbid status RLE Assessment RLE Assessment: Within Functional Limits LLE Assessment LLE Assessment: Within Functional Limits  FIM:  FIM - Bed/Chair Transfer Bed/Chair Transfer: 4: Chair or W/C > Bed: Min A (steadying Pt. > 75%);4: Bed > Chair or W/C: Min A (steadying Pt. > 75%) FIM - Locomotion: Wheelchair Distance: 100 Locomotion: Wheelchair: 2: Travels 50 - 149 ft  with supervision, cueing or coaxing FIM - Locomotion: Ambulation Ambulation/Gait Assistance: 3: Mod assist Locomotion: Ambulation: 1: Travels less than 50 ft with moderate assistance (Pt: 50 - 74%) FIM - Locomotion: Stairs Locomotion: Stairs: 2: Up and Down 4 - 11 stairs with minimal assistance (Pt.>75%)   Refer to Care Plan for Long Term Goals  Recommendations for other services: None  Discharge Criteria: Patient will be discharged from PT if patient refuses treatment 3 consecutive times without medical reason, if treatment goals not met, if there is a change in medical status, if patient makes no progress towards goals or if patient is discharged from hospital.  The above assessment, treatment plan, treatment alternatives and goals were discussed and mutually agreed upon: by patient  [facility] 12/10/2012, 12:56 PM

## 2012-12-10 NOTE — Evaluation (Signed)
Occupational Therapy Assessment and Plan  Patient Details  Name: [name] MRN: [id number] Date of Birth: Dec 21, 1950  OT Diagnosis: ataxia and muscle weakness (generalized) Rehab Potential: Rehab Potential: Good ELOS: 2 weeks   Today's Date: 12/10/2012 Time: 1100-1215  (75 min)  Time Calculation (min): 75 min  Problem List:  Patient Active Problem List   Diagnosis Date Noted  . Hypertensive emergency 12/06/2012  . Stroke, acute, embolic 12/06/2012  . Malignant hypertension 12/05/2012  . Cerebral embolism with cerebral infarction 12/05/2012  . Dyslipidemia 12/05/2012  . Alcohol abuse 12/05/2012  . Hypokalemia 12/05/2012  . Claudication, intermittent 11/07/2012  . Smokes tobacco daily 10/18/2012  . History of stroke 10/14/2010  . Elevated PSA 05/25/2007  . Hypertension 05/18/2007  . COPD 05/18/2007    Past Medical History:  Past Medical History  Diagnosis Date  . Hypertension 05/18/2007  . COPD 05/18/2007  . Elevated PSA 05/25/2007  . 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 Stroke 12/05/2012   Past Surgical History:  Past Surgical History  Procedure Laterality Date  . 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 RH-male with history of HTN, COPD, prior CVA; who was admitted on 12/05/12 with acute onset of left hand weakness, unsteady gait and slurred speech. Noted to have elevated SBP-220. Patient reported falls x 3 and dizziness earlier that day. 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 Work up initiated and ST evaluation reveals signs of moderate to severe dysphagia with difficulty handling oral secretions therefore NPO recommended with alternative means of nutrition for now. Panda placed but patient continues to ask for po's.Carotid dopplers without ICA stenosis. 2D echo done revealing EF 65-70% with grade 1  diastolic dysfunction. .  Patient transferred to CIR on 12/09/2012 .    Patient currently requires independent  with basic self-care skills secondary to impaired timing and sequencing, unbalanced muscle activation, ataxia and decreased coordination.  Prior to hospitalization, patient could complete *BADL with independent .  Patient will benefit from skilled intervention to increase independence with basic self-care skills prior to discharge home independently.  Anticipate patient will require minimal physical assistance and follow up home health.  OT - End of Session Activity Tolerance: Tolerates 10 - 20 min activity with multiple rests Endurance Deficit: Yes OT Assessment Rehab Potential: Good Barriers to Discharge: Decreased caregiver support OT Plan OT Intensity: Minimum of 1-2 x/day, 45 to 90 minutes OT Frequency: 5 out of 7 days OT Duration/Estimated Length of Stay: 2 weeks OT Treatment/Interventions: Balance/vestibular training;Community reintegration;Discharge planning;DME/adaptive equipment instruction;Functional mobility training;Neuromuscular re-education;Patient/family education;Self Care/advanced ADL retraining;Therapeutic Activities;Therapeutic Exercise;UE/LE Strength taining/ROM;UE/LE Coordination activities;Wheelchair propulsion/positioning   Skilled Therapeutic Intervention Pt. Observed propelling self in wc with decreased awareness of deficits,ie over reaching, not locking wc, Educated pt on wc safety and not standing up on his own but needs reinforcement.  Pt. Has dysarthric speech and hard to understand.  He lived a sedentary life PTA.    OT Evaluation Precautions/Restrictions  Precautions Precautions: Fall Precaution Comments:  (uncoord movements; impulsive; decreased safety awareness) Restrictions Weight Bearing Restrictions: No       Pain Pain Assessment Pain Assessment: No/denies pain Home Living/Prior Functioning Home Living Lives With: Alone Available  Help at Discharge:  (friends, Bobbie Stack) Type of Home:  (boarding house) Home Access: Level entry Home Layout: One level Bathroom  Shower/Tub: Walk-in shower;Curtain (grab bars) Bathroom Toilet: Pharmacist, community: Yes How Accessible: Accessible via wheelchair Home Adaptive Equipment: None IADL History Homemaking Responsibilities: Yes Meal Prep Responsibility: Primary Laundry Responsibility: Primary Cleaning Responsibility: Primary Shopping Responsibility: Primary Mode of Transportation: Bus Occupation: Retired Leisure and Hobbies:  (TV) Prior Function Able to Take Stairs?: No Driving: No ADL   Vision/Perception  Vision - History Baseline Vision: Wears glasses only for reading  Cognition Overall Cognitive Status: Difficult to assess Arousal/Alertness: Awake/alert Orientation Level: Oriented X4 Attention: Selective;Sustained Sustained Attention: Appears intact Selective Attention: Appears intact Alternating Attention: Appears intact Memory: Appears intact Awareness: Impaired Awareness Impairment: Emergent impairment;Anticipatory impairment Problem Solving: Appears intact Executive Function: Reasoning Reasoning: Impaired Reasoning Impairment: Verbal complex;Functional complex Behaviors: Impulsive Safety/Judgment: Impaired Comments: decreased awareness Sensation Sensation Light Touch: Appears Intact Stereognosis: Appears Intact Proprioception: Appears Intact Coordination Gross Motor Movements are Fluid and Coordinated: No Fine Motor Movements are Fluid and Coordinated: No Coordination and Movement Description: mis hitting with RUE; dec. proximal control Motor  Motor Motor - Skilled Clinical Observations: hyperkinetic mvmetns Mobility  Transfers Sit to Stand: 4: Min assist Sit to Stand Details: Tactile cues for posture;Tactile cues for placement;Verbal cues for precautions/safety;Verbal cues for safe use of DME/AE Sit to Stand Details (indicate  cue type and reason): mod. cues to lock wc Stand to Sit: 4: Min assist Stand to Sit Details (indicate cue type and reason): Verbal cues for precautions/safety;Tactile cues for weight shifting  Trunk/Postural Assessment  Cervical Assessment Cervical Assessment: Within Functional Limits Thoracic Assessment Thoracic Assessment: Within Functional Limits Lumbar Assessment Lumbar Assessment: Within Functional Limits Postural Control Postural Control: Deficits on evaluation Postural Limitations: dec. trunk strength for reaching  Balance Balance Balance Assessed: Yes Static Sitting Balance Static Sitting - Balance Support: Left upper extremity supported;Feet supported Static Sitting - Level of Assistance: 6: Modified independent (Device/Increase time) Dynamic Sitting Balance Dynamic Sitting - Balance Support: Feet supported;Right upper extremity supported Dynamic Sitting - Level of Assistance: 4: Min assist Dynamic Sitting Balance - Compensations: grab bars, counter for reaching feet, floor Dynamic Sitting - Balance Activities: Reaching for objects;Reaching across midline Static Standing Balance Static Standing - Balance Support: Bilateral upper extremity supported Static Standing - Level of Assistance: 3: Mod assist Dynamic Standing Balance Dynamic Standing - Balance Support: Bilateral upper extremity supported;During functional activity Dynamic Standing - Balance Activities: Forward lean/weight shifting;Lateral lean/weight shifting Dynamic Standing - Comments:  (shifting to pull up pants) Extremity/Trunk Assessment RUE Assessment RUE Assessment: Exceptions to [facility] RUE Strength RUE Overall Strength: Deficits LUE Assessment LUE Assessment: Exceptions to [facility] LUE Strength LUE Overall Strength: Due to premorbid status  FIM:  FIM - Eating Eating Activity: 0: Activity did not occur FIM - Bed/Chair Transfer Bed/Chair Transfer: 4: Chair or W/C > Bed: Min A (steadying Pt. > 75%);4: Bed >  Chair or W/C: Min A (steadying Pt. > 75%)   Refer to Care Plan for Long Term Goals  Recommendations for other services: None  Discharge Criteria: Patient will be discharged from OT if patient refuses treatment 3 consecutive times without medical reason, if treatment goals not met, if there is a change in medical status, if patient makes no progress towards goals or if patient is discharged from hospital.  The above assessment, treatment plan, treatment alternatives and goals were discussed and mutually agreed upon: by patient  Humberto Seals 12/10/2012, 12:11 PM

## 2012-12-11 ENCOUNTER — Inpatient Hospital Stay (HOSPITAL_COMMUNITY): Payer: Medicaid Other | Admitting: *Deleted

## 2012-12-11 LAB — GLUCOSE, CAPILLARY
Glucose-Capillary: 118 mg/dL — ABNORMAL HIGH (ref 70–99)
Glucose-Capillary: 121 mg/dL — ABNORMAL HIGH (ref 70–99)

## 2012-12-11 MED ORDER — BACLOFEN 10 MG PO TABS
10.0000 mg | ORAL_TABLET | Freq: Three times a day (TID) | ORAL | Status: DC
  Administered 2012-12-11 – 2012-12-12 (×5): 10 mg via ORAL
  Filled 2012-12-11 (×9): qty 1

## 2012-12-11 NOTE — Progress Notes (Signed)
Occupational Therapy Note  Patient Details  Name: [name] MRN: [id number] Date of Birth: 21-Jul-1951 Today's Date: 12/11/2012  Time:  1500-1600  (60 min) Individual session Pain:  None  Therapy session focused on wc mobility, RUE coordination, functional ambulation, balance, therapeutic activities.  Pt. Propelled wc from room to gym with supervision.  Went over wc set up and pt able to return set up for foot brakes.   Addressed standing balance with ring toss (minimal assist for balance), ambulated the length of the gym, and performed RUE fine motor tasks.  Pt. Reported fatigue so did not walk back to room, but propelled wc.     Humberto Seals 12/11/2012, 5:23 PM

## 2012-12-11 NOTE — Plan of Care (Signed)
Problem: RH SAFETY Goal: RH STG ADHERE TO SAFETY PRECAUTIONS W/ASSISTANCE/DEVICE STG Adhere to Safety Precautions With modified independence.  Outcome: Progressing On 5/3, did not call for assist to BR. Called appropriately for assit on 5/4 to BR and to bed.

## 2012-12-11 NOTE — Progress Notes (Signed)
Patient ID: [name], male   DOB: Jan 01, 1951, 62 y.o.   MRN: [id number] Subjective/Complaints: Hiccups all nite, increased secretions after Panda placement  Objective: Vital Signs: Blood pressure 145/90, pulse 73, temperature 98.1 F (36.7 C), temperature source Oral, resp. rate 18, height 5\' 8"  (1.727 m), weight 74.8 kg (164 lb 14.5 oz), SpO2 97.00%. Dg Chest 2 View  12/10/2012  *RADIOLOGY REPORT*  Clinical Data: Severe dysphagia, cough  CHEST - 2 VIEW  Comparison: Chest x-ray 12/05/2012  Findings: Linear atelectasis in the left base.  Cardiac and mediastinal contours remain within normal limits.  Chronic bronchitic changes again noted.  No edema, pleural effusion, pneumothorax or focal airspace consolidation.  No acute osseous abnormality.  IMPRESSION: Left basilar atelectasis.  Otherwise, no acute cardiopulmonary disease.   Original Report Authenticated By: Malachy Moan, M.D.    Dg Intro Long Gi Tube  12/10/2012  *RADIOLOGY REPORT*  Clinical history:Dysphagia and needs tube feedings.  PROCEDURE(S): PLACEMENT OF FEEDING TUBE WITH FLUOROSCOPY  Physician: Rachelle Hora. Henn, MD  Medications:None  Moderate sedation time:None  Fluoroscopy time: 2 minutes and 34 seconds  Procedure:The patient was placed supine on the fluoroscopic table. The right nostril was anesthetized with viscous lidocaine.  A Kangaroo feeding tube was placed in the right nostril and directed into the stomach with fluoroscopy.  The tube was successfully advanced into the distal duodenum with fluoroscopic guidance. Contrast injection confirmed placement in the small bowel.  The tube was flushed with water.  Findings:Feeding tube tip is near the ligament of Treitz.  Complications: None  Impression:Successful placement of a feeding tube in the distal duodenum.  The tube is ready to be used.   Original Report Authenticated By: Richarda Overlie, M.D.    Results for orders placed during the hospital encounter of 12/09/12 (from the past 72 hour(s))   CBC     Status: None   Collection Time    12/09/12  9:27 PM      Result Value Range   WBC 9.4  4.0 - 10.5 K/uL   RBC 4.75  4.22 - 5.81 MIL/uL   Hemoglobin 14.3  13.0 - 17.0 g/dL   HCT 13.0  86.5 - 78.4 %   MCV 86.1  78.0 - 100.0 fL   MCH 30.1  26.0 - 34.0 pg   MCHC 35.0  30.0 - 36.0 g/dL   RDW 69.6  29.5 - 28.4 %   Platelets 305  150 - 400 K/uL  CREATININE, SERUM     Status: Abnormal   Collection Time    12/09/12  9:27 PM      Result Value Range   Creatinine, Ser 1.16  0.50 - 1.35 mg/dL   GFR calc non Af Amer 65 (*) >90 mL/min   GFR calc Af Amer 76 (*) >90 mL/min   Comment:            The eGFR has been calculated     using the CKD EPI equation.     This calculation has not been     validated in all clinical     situations.     eGFR's persistently     <90 mL/min signify     possible Chronic Kidney Disease.  GLUCOSE, CAPILLARY     Status: None   Collection Time    12/09/12 10:18 PM      Result Value Range   Glucose-Capillary 94  70 - 99 mg/dL   Comment 1 Notify RN    GLUCOSE, CAPILLARY  Status: None   Collection Time    12/10/12  7:45 AM      Result Value Range   Glucose-Capillary 83  70 - 99 mg/dL  GLUCOSE, CAPILLARY     Status: None   Collection Time    12/10/12 11:43 AM      Result Value Range   Glucose-Capillary 91  70 - 99 mg/dL  GLUCOSE, CAPILLARY     Status: Abnormal   Collection Time    12/10/12  4:15 PM      Result Value Range   Glucose-Capillary 105 (*) 70 - 99 mg/dL  GLUCOSE, CAPILLARY     Status: None   Collection Time    12/10/12  9:53 PM      Result Value Range   Glucose-Capillary 90  70 - 99 mg/dL  GLUCOSE, CAPILLARY     Status: Abnormal   Collection Time    12/11/12  6:27 AM      Result Value Range   Glucose-Capillary 104 (*) 70 - 99 mg/dL  GLUCOSE, CAPILLARY     Status: Abnormal   Collection Time    12/11/12  7:24 AM      Result Value Range   Glucose-Capillary 103 (*) 70 - 99 mg/dL     HEENT: oromotor weakness Cardio: RRR and no  murmur Resp: Rales and upper airway sounds, unlabored, using Yankauer suction GI: BS positive and non tender Extremity:  Pulses positive and No Edema Skin:   Intact Neuro: Alert/Oriented, Cranial Nerve Abnormalities CN 9 and 10, , Abnormal Motor 4/5 in BUE and BLE, Abnormal FMC Ataxic/ dec FMC and Dysarthric Musc/Skel:  Normal Gen NAD, hiccups   Assessment/Plan: 1. 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 Physiatrist is providing close team supervision and 24 hour management of active medical problems listed below. Physiatrist and rehab team continue to assess barriers to discharge/monitor patient progress toward functional and medical goals. FIM: FIM - Bathing Bathing Steps Patient Completed: Chest;Right Arm;Left Arm;Abdomen;Front perineal area;Buttocks;Right upper leg;Left upper leg Bathing: 4: Min-Patient completes 8-9 89f 10 parts or 75+ percent  FIM - Upper Body Dressing/Undressing Upper body dressing/undressing steps patient completed: Thread/unthread right sleeve of pullover shirt/dresss;Thread/unthread left sleeve of pullover shirt/dress;Put head through opening of pull over shirt/dress;Pull shirt over trunk Upper body dressing/undressing: 4: Steadying assist FIM - Lower Body Dressing/Undressing Lower body dressing/undressing steps patient completed: Thread/unthread right pants leg;Thread/unthread left pants leg;Pull pants up/down;Don/Doff right sock;Don/Doff left sock Lower body dressing/undressing: 4: Min-Patient completed 75 plus % of tasks  FIM - Toileting Toileting steps completed by patient: Performs perineal hygiene Toileting Assistive Devices: Grab bar or rail for support Toileting: 4: Steadying assist  FIM - Diplomatic Services operational officer Devices: Grab bars Toilet Transfers: 4-To toilet/BSC: Min A (steadying Pt. > 75%);4-From toilet/BSC:  Min A (steadying Pt. > 75%)  FIM - Bed/Chair Transfer Bed/Chair Transfer: 4: Bed > Chair or W/C: Min A (steadying Pt. > 75%);4: Chair or W/C > Bed: Min A (steadying Pt. > 75%)  FIM - Locomotion: Wheelchair Distance: 100 Locomotion: Wheelchair: 2: Travels 50 - 149 ft with supervision, cueing or coaxing FIM - Locomotion: Ambulation Ambulation/Gait Assistance: 3: Mod assist Locomotion: Ambulation: 1: Travels less than 50 ft with moderate assistance (Pt: 50 - 74%)  Comprehension Comprehension Mode: Auditory Comprehension: 5-Understands complex 90% of the time/Cues < 10% of the time  Expression Expression Mode: Verbal Expression: 4-Expresses basic 75 -  89% of the time/requires cueing 10 - 24% of the time. Needs helper to occlude trach/needs to repeat words.  Social Interaction Social Interaction: 5-Interacts appropriately 90% of the time - Needs monitoring or encouragement for participation or interaction.  Problem Solving Problem Solving: 5-Solves basic 90% of the time/requires cueing < 10% of the time  Memory Memory: 5-Requires cues to use assistive device  Medical Problem List and Plan:  1. DVT Prophylaxis/Anticoagulation: Pharmaceutical: Lovenox  2. Pain Management: N/A  3. Mood: Will have LCSW follow for evaluation.  4. Neuropsych: This patient is not capable of making decisions on his/her own behalf.  5. HTN: Monitor with bid checks. Continue HCTZ and Prinivil for now. May need to re-evaluate diuretic in face of modified diet and acute renal insufficiency.  6. COPD: Continue spiriva. Prn nebs for SOB. Keep HOB elevated at 30 degrees. Encourage IS.  7. Dysphagia: Continue NPO due to severe dysphagia. TF for nutritional support.  8.  Hiccups, baclofen LOS (Days) 2 A FACE TO FACE EVALUATION WAS PERFORMED  [name],Gurtej E 12/11/2012, 9:42 AM

## 2012-12-12 ENCOUNTER — Inpatient Hospital Stay (HOSPITAL_COMMUNITY): Payer: Medicaid Other | Admitting: Physical Therapy

## 2012-12-12 ENCOUNTER — Inpatient Hospital Stay (HOSPITAL_COMMUNITY): Payer: Medicaid Other

## 2012-12-12 ENCOUNTER — Inpatient Hospital Stay (HOSPITAL_COMMUNITY): Payer: Medicaid Other | Admitting: Speech Pathology

## 2012-12-12 ENCOUNTER — Inpatient Hospital Stay (HOSPITAL_COMMUNITY): Payer: Medicaid Other | Admitting: Occupational Therapy

## 2012-12-12 DIAGNOSIS — I6330 Cerebral infarction due to thrombosis of unspecified cerebral artery: Secondary | ICD-10-CM

## 2012-12-12 LAB — CBC WITH DIFFERENTIAL/PLATELET
Basophils Absolute: 0 10*3/uL (ref 0.0–0.1)
Basophils Relative: 0 % (ref 0–1)
HCT: 38.5 % — ABNORMAL LOW (ref 39.0–52.0)
Lymphocytes Relative: 30 % (ref 12–46)
MCHC: 34 g/dL (ref 30.0–36.0)
Monocytes Absolute: 1.1 10*3/uL — ABNORMAL HIGH (ref 0.1–1.0)
Neutro Abs: 4.3 10*3/uL (ref 1.7–7.7)
Neutrophils Relative %: 54 % (ref 43–77)
Platelets: 294 10*3/uL (ref 150–400)
RDW: 14.1 % (ref 11.5–15.5)
WBC: 8 10*3/uL (ref 4.0–10.5)

## 2012-12-12 LAB — COMPREHENSIVE METABOLIC PANEL
ALT: 41 U/L (ref 0–53)
AST: 41 U/L — ABNORMAL HIGH (ref 0–37)
Albumin: 3.2 g/dL — ABNORMAL LOW (ref 3.5–5.2)
Alkaline Phosphatase: 79 U/L (ref 39–117)
CO2: 27 mEq/L (ref 19–32)
Chloride: 109 mEq/L (ref 96–112)
Creatinine, Ser: 0.95 mg/dL (ref 0.50–1.35)
GFR calc non Af Amer: 87 mL/min — ABNORMAL LOW (ref >90)
Potassium: 4.1 mEq/L (ref 3.5–5.1)
Sodium: 142 mEq/L (ref 135–145)
Total Bilirubin: 0.2 mg/dL — ABNORMAL LOW (ref 0.3–1.2)

## 2012-12-12 LAB — GLUCOSE, CAPILLARY
Glucose-Capillary: 103 mg/dL — ABNORMAL HIGH (ref 70–99)
Glucose-Capillary: 110 mg/dL — ABNORMAL HIGH (ref 70–99)
Glucose-Capillary: 107 mg/dL — ABNORMAL HIGH (ref 70–99)
Glucose-Capillary: 87 mg/dL (ref 70–99)

## 2012-12-12 NOTE — Progress Notes (Signed)
Occupational Therapy Note  Patient Details  Name: [name] MRN: [id number] Date of Birth: 1951/08/09 Today's Date: 12/12/2012  Time: 1:03-1:44pm ( ) Pt seen for 1:1 OT session focusing on transfers, standing/activiy tolerance/balance and functional use of bil UE's. Pt sitting EOB upon arrival, c/o NG tube. Pt agreeable to go to gym and maneuvered himself in wheelchair. Pt stood at raised table to put together pipe maze x 3 using bil UE's. Pt took 1 rest break during activity. While standing, intermittent verbal/manual cueing to keep R knee straight. Pt self corrected postural lean several times while standing. Pt used to do carpet/tile work, and seemed to enjoy the pipe puzzles. In kitchen, pt stood at counter to retrieve different sized items out of cabinets. LOB 1x, with pt leaning towards R. Once back in room, pt got back into bed with bed alarm set and call bell within reach. No pain reported today.    [name] 12/12/2012, 2:33 PM

## 2012-12-12 NOTE — Progress Notes (Signed)
Physical Therapy Session Note  Patient Details  Name: [name] MRN: [id number] Date of Birth: 11/21/1950  Today's Date: 12/12/2012 Time: 1010-1104 Time Calculation (min): 54 min  Short Term Goals: Week 1:  PT Short Term Goal 1 (Week 1): Pt will perform functional transfers with min A PT Short Term Goal 2 (Week 1): Pt will gait in controlled environment min A 150'  Therapy Documentation Precautions:  Precautions Precautions: Fall Precaution Comments:  (uncoord movements; impulsive; decreased safety awareness) Restrictions Weight Bearing Restrictions: No Vital Signs: Therapy Vitals BP: 132/80 mmHg Oxygen Therapy SpO2: 93 % O2 Device: None (Room air) Pain: Pain Assessment Pain Assessment: No/denies pain Pain Score: 0-No pain Mobility:  Patient performed supine > sit EOB with supervision with HOB elevated but verbal cues for safety secondary to impulsiveness.  Transfers bed > w/c with mod A to maintain full hip elevation during full pivot.  During transfer from mat back to w/c patient instructed to perform controlled sit > squat and pivot; patient able to carry over NMR and able to perform transfer min A with improved control. Locomotion : Wheelchair Mobility Distance: 150 Performed w/c mobility in controlled environment x 150' x 2 reps with supervision-min A with frequent verbal cues for UE propulsion sequence for changing direction and to navigate around obstacles Balance: Balance Balance Assessed: Yes Standardized Balance Assessment Standardized Balance Assessment: Berg Balance Test Berg Balance Test Sit to Stand: Needs minimal aid to stand or to stabilize Standing Unsupported: Able to stand 2 minutes with supervision Sitting with Back Unsupported but Feet Supported on Floor or Stool: Able to sit safely and securely 2 minutes Stand to Sit: Sits independently, has uncontrolled descent Transfers: Needs one person to assist Standing Unsupported with Eyes Closed: Able to  stand 10 seconds with supervision Standing Ubsupported with Feet Together: Needs help to attain position but able to stand for 30 seconds with feet together From Standing, Reach Forward with Outstretched Arm: Can reach forward >12 cm safely (5") From Standing Position, Pick up Object from Floor: Able to pick up shoe, needs supervision From Standing Position, Turn to Look Behind Over each Shoulder: Turn sideways only but maintains balance Turn 360 Degrees: Needs assistance while turning Standing Unsupported, Alternately Place Feet on Step/Stool: Able to complete >2 steps/needs minimal assist Standing Unsupported, One Foot in Front: Loses balance while stepping or standing Standing on One Leg: Unable to try or needs assist to prevent fall Total Score: 23 Patient demonstrates increased fall risk as noted by score of 23/56 on Berg Balance Scale.  (<36= high risk for falls, close to 100%; 37-45 significant >80%; 46-51 moderate >50%; 52-55 lower >25%)  Other Treatments: Treatments Neuromuscular Facilitation: Right;Left;Upper Extremity;Lower Extremity;Activity to increase coordination;Activity to increase motor control;Activity to increase timing and sequencing;Activity to increase grading;Activity to increase lateral weight shifting;Activity to increase anterior-posterior weight shifting during controlled sit <> stand from mat beginning with all 4 extremities in WB with UE support on back of w/c x 5-6 reps with pt use of counting to 8 for controlled sit <> stand; transitioned to use of UE support on knees and sliding UE up/down knees and still use of 8 count for increased control sit <> stand.  Required manual facilitation for full anterior translation of COG over BOS during sit > stand and anterior tibial and femoral translation during sit <> stand and cues to maintain COG in midline; patient tends to lean to R. Min-mod A needed overall.    See FIM for current functional  status  Therapy/Group: Individual  Therapy  [name] [facility] 12/12/2012, 11:57 AM

## 2012-12-12 NOTE — Progress Notes (Signed)
Patient information reviewed and entered into eRehab system by Ademide Schaberg, RN, CRRN, PPS Coordinator.  Information including medical coding and functional independence measure will be reviewed and updated through discharge.    

## 2012-12-12 NOTE — Care Management Note (Signed)
[facility] Individual Statement of Services  Patient Name:  [name]  Date:  12/12/2012  [facility] the [facility].  Our goal is to provide you with an individualized program based on your diagnosis and situation, designed to meet your specific needs.  With this comprehensive rehabilitation program, you will be expected to participate in at least 3 hours of rehabilitation therapies Monday-Friday, with modified therapy programming on the weekends.  Your rehabilitation program will include the following services:  Physical Therapy (PT), Occupational Therapy (OT), Speech Therapy (ST), 24 hour per day rehabilitation nursing, Therapeutic Recreaction (TR), Case Management (  Social Worker), Rehabilitation Medicine, Nutrition Services and Pharmacy Services  Weekly team conferences will be held on Wednesday to discuss your progress.  Your Social Worker will talk with you frequently to get your input and to update you on team discussions.  Team conferences with you and your family in attendance may also be held.  Expected length of stay: 2 weeks Overall anticipated outcome: supervision/min level  Depending on your progress and recovery, your program may change. Your Social Worker will coordinate services and will keep you informed of any changes. Your Child psychotherapist names and contact numbers are listed  below.  The following services may also be recommended but are not provided by the [facility]:   Home Health Rehabiltiation Services  Outpatient Rehabilitatation Servives   Arrangements will be made to provide these services after discharge if needed.  Arrangements include referral to agencies that provide these services.  Your insurance has been verified to be:  Medicaid Your primary doctor is:  Dr Gwynn Burly  Pertinent information will be shared with your doctor and your insurance company.  Social Worker:  Dossie Der, [place]  [phone]  Information discussed with and copy given to patient by: Lucy Chris, 12/12/2012, 10:23 AM

## 2012-12-12 NOTE — Progress Notes (Signed)
Social Work Assessment and Plan Social Work Assessment and Plan  Patient Details  Name: [name] MRN: [id number] Date of Birth: 1951-05-03  Today's Date: 12/12/2012  Problem List:  Patient Active Problem List   Diagnosis Date Noted  . Hypertensive emergency 12/06/2012  . Stroke, acute, embolic 12/06/2012  . Malignant hypertension 12/05/2012  . Cerebral embolism with cerebral infarction 12/05/2012  . Dyslipidemia 12/05/2012  . Alcohol abuse 12/05/2012  . Hypokalemia 12/05/2012  . Claudication, intermittent 11/07/2012  . Smokes tobacco daily 10/18/2012  . History of stroke 10/14/2010  . Elevated PSA 05/25/2007  . Hypertension 05/18/2007  . COPD 05/18/2007   Past Medical History:  Past Medical History  Diagnosis Date  . Hypertension 05/18/2007  . COPD 05/18/2007  . Elevated PSA 05/25/2007  . 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 Stroke 12/05/2012   Past Surgical History:  Past Surgical History  Procedure Laterality Date  . 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  He has been smoking about 0.33 packs per day. He has never used smokeless tobacco. He reports that he drinks about 7.0 ounces of alcohol per week. He reports that he does not use illicit drugs.  Family / Support Systems Marital Status: Single Patient Roles: Other (Comment) (Cousin & Friend) Other Supports: Crystal Graves-cousin  [phone] Anticipated Caregiver: Have offered to have pt come and stay with her but she works full time-looking for someoen to stay with him while she is working Ability/Limitations of Caregiver: Currently does not have 24 hr care-Crystal looking into this. Caregiver Availability: Other (Comment) (trying to come up with a care plan) Family Dynamics: Pt has limited supports-has cousins.  He has friends in the boarding home where he has been the past two years.  Pt would like to go back to if he can upon  discharge.  Social History Preferred language: English Religion: Baptist Cultural Background: No issues Education: [employer] Read: Yes Write: Yes Employment Status: Disabled Fish farm manager Issues: No issues Guardian/Conservator: None-according to MD pt is capable of making his own decisions   Abuse/Neglect Physical Abuse: Denies Verbal Abuse: Denies Sexual Abuse: Denies Exploitation of patient/patient's resources: Denies Self-Neglect: Denies  Emotional Status Pt's affect, behavior adn adjustment status: Pt is motivated and wants to improve, he really wants to eat but awar eof his swallowing issues.  He hopes to have a swallow test this week to see if he can get the NG tube out.  He reports: " It is annoying." Recent Psychosocial Issues: Other medical issues-previous CVA Pyschiatric History: No history deferred depression screen due to tired from all morning therapies and wanted to rest also coughing from his secretions.  Will monitor while here and see if would benefit from Neuro-psych eval  Substance Abuse History: History of ETOH reprots he has quit  Patient / Family Perceptions, Expectations & Goals Pt/Family understanding of illness & functional limitations: Pt and cousin can expalin his stroke and deficits.  Both have a basic understanding of his swallowing issues and weakness.  This worker does not feel he will get to mod/i level and will need assist at discharge, unsure if cousin is prepared to provide this. 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) Premorbid Home Care/DME Agencies: None Transportation available at discharge:  Uses bus or walks Resource referrals recommended: Support group (specify) (CVA Support group)  Discharge Planning Living Arrangements: Other (Comment) Armed forces training and education officer) Support Systems: Other relatives;Friends/neighbors Type of Residence: Other (Comment) (Boarding [employer]) Insurance Resources: Medicaid (specify county) ([place]) Surveyor, quantity Resources: SSI Financial Screen Referred: Previously completed Living Expenses: Psychologist, sport and exercise Management: Patient Do you have any problems obtaining your medications?: No Home Management: Self Patient/Family Preliminary Plans: Wants to return to the boarding house-would need to be mod/i to return.  Crystal-cousin to look to see if she can provide 24 hr care.  Informed her he would need 24 hr supervision at the very least upon discharge.  See how progresses and next swallow test goes. Barriers to Discharge: Other (Comment) (Limited supports-no 24 hr plan) Social Work Anticipated Follow Up Needs: HH/OP;SNF;Support Group DC Planning Additional Notes/Comments: Pt most likely will require NHP if can not reach mod/i level at discharge  Clinical Impression Pleasant gentleman who is motivated and wants to eat again.  He has awareness deficits and will likely require 24 hour supervision/care at discharge.  Crystal-cousin willing to help but can not Provide 24 hr care.  Await pt's progress here.  Lucy Chris 12/12/2012, 11:33 AM

## 2012-12-12 NOTE — Progress Notes (Signed)
Speech Language Pathology Daily Session Note  Patient Details  Name: [name] MRN: [id number] Date of Birth: May 30, 1951  Today's Date: 12/12/2012 Time: 0935-1000 Time Calculation (min): 25 min  Short Term Goals: Week 1: SLP Short Term Goal 1 (Week 1): Pt will self-monitor and correct wet vocal quality with Mod verbal and question cues.  SLP Short Term Goal 2 (Week 1): Pt will perform pharyngeal strengthening exercises with Min verbal cues.  SLP Short Term Goal 3 (Week 1): Pt will consume trials of ice chips with minimal overt s/s of aspiration with 50% of trials with Mod A verbal cues.  SLP Short Term Goal 4 (Week 1): Pt will utilize speech intelligibility strategies with Mod A verbal and question cues for 75% intelligibility at the conversation level SLP Short Term Goal 5 (Week 1): Pt will demonstrate emergent awareness of swallowing impairments with Mod A question and semantic cues.  Skilled Therapeutic Interventions: Skilled treatment session focused on addressing dysphagia goals.  SLP facilitated session with set up and supervision level vebrbal cues to perform oral care with suctioning.  SLP also facilitated session with max assist verbal and demonstrative cues to utilize previously given handout to recall and perform pharyngeal strengthening exercises.  Patient initially required max assist cues to self-monitor and clear wet vocal quality.  Once trials began patient demonstrated timely swallows with 6/6 ice chip trials and required SLP cues for multiple swallows and thraot clears as a precaution; however, patient demonstrated no overt s/s of aspiration during trials.  Continue with current plan of care; if patient demonstrates improved management of oral secreations recommend repeat objective study in about 2 days.     FIM:  Comprehension Comprehension Mode: Auditory Comprehension: 6-Follows complex conversation/direction: With extra time/assistive device Expression Expression Mode:  Verbal Expression: 3-Expresses basic 50 - 74% of the time/requires cueing 25 - 50% of the time. Needs to repeat parts of sentences. Social Interaction Social Interaction: 4-Interacts appropriately 75 - 89% of the time - Needs redirection for appropriate language or to initiate interaction. Problem Solving Problem Solving: 5-Solves basic 90% of the time/requires cueing < 10% of the time Memory Memory: 2-Recognizes or recalls 25 - 49% of the time/requires cueing 51 - 75% of the time FIM - Eating Eating Activity: 0: Activity did not occur;5: Needs verbal cues/supervision  Pain Pain Assessment Pain Assessment: No/denies pain  Therapy/Group: Individual Therapy  Terry Ferretti., CCC-SLP [phone]  [name] 12/12/2012, 11:25 AM

## 2012-12-12 NOTE — Progress Notes (Signed)
Patient ID: [name], male   DOB: 1951/07/25, 62 y.o.   MRN: [id number] Subjective/Complaints: Hiccups all nite, increased secretions after Panda placement Pt is frustrated by tube "not helping me"  Objective: Vital Signs: Blood pressure 143/92, pulse 76, temperature 97.5 F (36.4 C), temperature source Oral, resp. rate 20, height 5\' 8"  (1.727 m), weight 74.6 kg (164 lb 7.4 oz), SpO2 97.00%. Dg Intro Long Gi Tube  12/10/2012  *RADIOLOGY REPORT*  Clinical history:Dysphagia and needs tube feedings.  PROCEDURE(S): PLACEMENT OF FEEDING TUBE WITH FLUOROSCOPY  Physician: Rachelle Hora. Henn, MD  Medications:None  Moderate sedation time:None  Fluoroscopy time: 2 minutes and 34 seconds  Procedure:The patient was placed supine on the fluoroscopic table. The right nostril was anesthetized with viscous lidocaine.  A Kangaroo feeding tube was placed in the right nostril and directed into the stomach with fluoroscopy.  The tube was successfully advanced into the distal duodenum with fluoroscopic guidance. Contrast injection confirmed placement in the small bowel.  The tube was flushed with water.  Findings:Feeding tube tip is near the ligament of Treitz.  Complications: None  Impression:Successful placement of a feeding tube in the distal duodenum.  The tube is ready to be used.   Original Report Authenticated By: Richarda Overlie, M.D.    Results for orders placed during the hospital encounter of 12/09/12 (from the past 72 hour(s))  CBC     Status: None   Collection Time    12/09/12  9:27 PM      Result Value Range   WBC 9.4  4.0 - 10.5 K/uL   RBC 4.75  4.22 - 5.81 MIL/uL   Hemoglobin 14.3  13.0 - 17.0 g/dL   HCT 40.9  81.1 - 91.4 %   MCV 86.1  78.0 - 100.0 fL   MCH 30.1  26.0 - 34.0 pg   MCHC 35.0  30.0 - 36.0 g/dL   RDW 78.2  95.6 - 21.3 %   Platelets 305  150 - 400 K/uL  CREATININE, SERUM     Status: Abnormal   Collection Time    12/09/12  9:27 PM      Result Value Range   Creatinine, Ser 1.16  0.50 - 1.35  mg/dL   GFR calc non Af Amer 65 (*) >90 mL/min   GFR calc Af Amer 76 (*) >90 mL/min   Comment:            The eGFR has been calculated     using the CKD EPI equation.     This calculation has not been     validated in all clinical     situations.     eGFR's persistently     <90 mL/min signify     possible Chronic Kidney Disease.  GLUCOSE, CAPILLARY     Status: None   Collection Time    12/09/12 10:18 PM      Result Value Range   Glucose-Capillary 94  70 - 99 mg/dL   Comment 1 Notify RN    GLUCOSE, CAPILLARY     Status: None   Collection Time    12/10/12  7:45 AM      Result Value Range   Glucose-Capillary 83  70 - 99 mg/dL  GLUCOSE, CAPILLARY     Status: None   Collection Time    12/10/12 11:43 AM      Result Value Range   Glucose-Capillary 91  70 - 99 mg/dL  GLUCOSE, CAPILLARY     Status: Abnormal  Collection Time    12/10/12  4:15 PM      Result Value Range   Glucose-Capillary 105 (*) 70 - 99 mg/dL  GLUCOSE, CAPILLARY     Status: None   Collection Time    12/10/12  9:53 PM      Result Value Range   Glucose-Capillary 90  70 - 99 mg/dL  GLUCOSE, CAPILLARY     Status: Abnormal   Collection Time    12/11/12  6:27 AM      Result Value Range   Glucose-Capillary 104 (*) 70 - 99 mg/dL  GLUCOSE, CAPILLARY     Status: Abnormal   Collection Time    12/11/12  7:24 AM      Result Value Range   Glucose-Capillary 103 (*) 70 - 99 mg/dL  GLUCOSE, CAPILLARY     Status: Abnormal   Collection Time    12/11/12 11:27 AM      Result Value Range   Glucose-Capillary 118 (*) 70 - 99 mg/dL  GLUCOSE, CAPILLARY     Status: Abnormal   Collection Time    12/11/12  4:12 PM      Result Value Range   Glucose-Capillary 121 (*) 70 - 99 mg/dL  GLUCOSE, CAPILLARY     Status: Abnormal   Collection Time    12/11/12  9:11 PM      Result Value Range   Glucose-Capillary 118 (*) 70 - 99 mg/dL   Comment 1 Notify RN    CBC WITH DIFFERENTIAL     Status: Abnormal   Collection Time    12/12/12   5:10 AM      Result Value Range   WBC 8.0  4.0 - 10.5 K/uL   RBC 4.44  4.22 - 5.81 MIL/uL   Hemoglobin 13.1  13.0 - 17.0 g/dL   HCT 27.2 (*) 53.6 - 64.4 %   MCV 86.7  78.0 - 100.0 fL   MCH 29.5  26.0 - 34.0 pg   MCHC 34.0  30.0 - 36.0 g/dL   RDW 03.4  74.2 - 59.5 %   Platelets 294  150 - 400 K/uL   Neutrophils Relative 54  43 - 77 %   Neutro Abs 4.3  1.7 - 7.7 K/uL   Lymphocytes Relative 30  12 - 46 %   Lymphs Abs 2.4  0.7 - 4.0 K/uL   Monocytes Relative 14 (*) 3 - 12 %   Monocytes Absolute 1.1 (*) 0.1 - 1.0 K/uL   Eosinophils Relative 2  0 - 5 %   Eosinophils Absolute 0.2  0.0 - 0.7 K/uL   Basophils Relative 0  0 - 1 %   Basophils Absolute 0.0  0.0 - 0.1 K/uL  COMPREHENSIVE METABOLIC PANEL     Status: Abnormal   Collection Time    12/12/12  5:10 AM      Result Value Range   Sodium 142  135 - 145 mEq/L   Potassium 4.1  3.5 - 5.1 mEq/L   Chloride 109  96 - 112 mEq/L   CO2 27  19 - 32 mEq/L   Glucose, Bld 125 (*) 70 - 99 mg/dL   BUN 21  6 - 23 mg/dL   Creatinine, Ser 6.38  0.50 - 1.35 mg/dL   Calcium 75.6  8.4 - 43.3 mg/dL   Total Protein 8.1  6.0 - 8.3 g/dL   Albumin 3.2 (*) 3.5 - 5.2 g/dL   AST 41 (*) 0 - 37 U/L   ALT  41  0 - 53 U/L   Alkaline Phosphatase 79  39 - 117 U/L   Total Bilirubin 0.2 (*) 0.3 - 1.2 mg/dL   GFR calc non Af Amer 87 (*) >90 mL/min   GFR calc Af Amer >90  >90 mL/min   Comment:            The eGFR has been calculated     using the CKD EPI equation.     This calculation has not been     validated in all clinical     situations.     eGFR's persistently     <90 mL/min signify     possible Chronic Kidney Disease.  GLUCOSE, CAPILLARY     Status: Abnormal   Collection Time    12/12/12  5:31 AM      Result Value Range   Glucose-Capillary 110 (*) 70 - 99 mg/dL   Comment 1 Notify RN    GLUCOSE, CAPILLARY     Status: Abnormal   Collection Time    12/12/12  7:27 AM      Result Value Range   Glucose-Capillary 110 (*) 70 - 99 mg/dL     HEENT:  oromotor weakness Cardio: RRR and no murmur Resp: Rales and upper airway sounds, unlabored, using Yankauer suction GI: BS positive and non tender Extremity:  Pulses positive and No Edema Skin:   Intact Neuro: Alert/Oriented, Cranial Nerve Abnormalities CN 9 and 10, , Abnormal Motor 4/5 in BUE and BLE, Abnormal FMC Ataxic/ dec FMC and Dysarthric Musc/Skel:  Normal Gen NAD, hiccups   Assessment/Plan: 1. 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 Physiatrist is providing close team supervision and 24 hour management of active medical problems listed below. Physiatrist and rehab team continue to assess barriers to discharge/monitor patient progress toward functional and medical goals. FIM: FIM - Bathing Bathing Steps Patient Completed: Chest;Right Arm;Left Arm;Abdomen;Front perineal area;Buttocks;Right upper leg;Left upper leg Bathing: 4: Min-Patient completes 8-9 87f 10 parts or 75+ percent  FIM - Upper Body Dressing/Undressing Upper body dressing/undressing steps patient completed: Thread/unthread right sleeve of pullover shirt/dresss;Thread/unthread left sleeve of pullover shirt/dress;Put head through opening of pull over shirt/dress;Pull shirt over trunk Upper body dressing/undressing: 4: Steadying assist FIM - Lower Body Dressing/Undressing Lower body dressing/undressing steps patient completed: Thread/unthread right pants leg;Thread/unthread left pants leg;Pull pants up/down;Don/Doff right sock;Don/Doff left sock Lower body dressing/undressing: 4: Min-Patient completed 75 plus % of tasks  FIM - Toileting Toileting steps completed by patient: Performs perineal hygiene Toileting Assistive Devices: Grab bar or rail for support Toileting: 4: Steadying assist  FIM - Diplomatic Services operational officer Devices: Grab bars Toilet Transfers: 4-To toilet/BSC: Min A  (steadying Pt. > 75%);4-From toilet/BSC: Min A (steadying Pt. > 75%)  FIM - Bed/Chair Transfer Bed/Chair Transfer: 4: Bed > Chair or W/C: Min A (steadying Pt. > 75%);4: Chair or W/C > Bed: Min A (steadying Pt. > 75%)  FIM - Locomotion: Wheelchair Distance: 100 Locomotion: Wheelchair: 2: Travels 50 - 149 ft with supervision, cueing or coaxing FIM - Locomotion: Ambulation Ambulation/Gait Assistance: 3: Mod assist Locomotion: Ambulation: 1: Travels less than 50 ft with moderate assistance (Pt: 50 - 74%)  Comprehension Comprehension Mode: Auditory Comprehension: 5-Understands complex 90% of the time/Cues < 10% of the time  Expression Expression Mode: Verbal Expression: 4-Expresses basic 75 - 89% of the time/requires cueing 10 - 24% of the time. Needs helper to  occlude trach/needs to repeat words.  Social Interaction Social Interaction: 5-Interacts appropriately 90% of the time - Needs monitoring or encouragement for participation or interaction.  Problem Solving Problem Solving: 5-Solves basic 90% of the time/requires cueing < 10% of the time  Memory Memory: 5-Requires cues to use assistive device  Medical Problem List and Plan:  1. DVT Prophylaxis/Anticoagulation: Pharmaceutical: Lovenox  2. Pain Management: N/A  3. Mood: Will have LCSW follow for evaluation.  4. Neuropsych: This patient is not capable of making decisions on his/her own behalf.  5. HTN: Monitor with bid checks. Continue HCTZ and Prinivil for now. May need to re-evaluate diuretic in face of modified diet and acute renal insufficiency.  6. COPD: Continue spiriva. Prn nebs for SOB. Keep HOB elevated at 30 degrees. Encourage IS.  7. Dysphagia: Continue NPO due to severe dysphagia. TF for nutritional support.  8.  Hiccups, baclofen titrate upwards LOS (Days) 3 A FACE TO FACE EVALUATION WAS PERFORMED  Jacques Fife,Nobuo E 12/12/2012, 7:58 AM

## 2012-12-12 NOTE — Progress Notes (Signed)
Occupational Therapy Session Note  Patient Details  Name: [name] MRN: [id number] Date of Birth: [birth year]  Today's Date: 12/12/2012 Time: 1115-1200 Time Calculation (min): 45 min  Short Term Goals: Week 1:  OT Short Term Goal 1 (Week 1): Pt. will bath self using sit to stand with supervision OT Short Term Goal 2 (Week 1): Pt. will groom self with standing at sup for 5 minutes OT Short Term Goal 3 (Week 1): Pt. will dress self at supervision level OT Short Term Goal 4 (Week 1): Pt. will engage in toilet transfer at supervision level OT Short Term Goal 5 (Week 1): Pt. will transfer to shower stall at minimal assist level  Skilled Therapeutic Interventions/Progress Updates:    Pt initially began session with bathing and dressing at sink level secondary to NG tube feeding.  Pt able to perform all UB bathing and dressing as well as grooming in sitting with supervision.  Pt did not want to attempt any LB bathing and was already dressed prior to session.  Continued session working on dynamic balance and mobility with use of the RW.  Pt walked down to the orthopedic gym with mod assist and increased lean to the right side with each step.  At times pt unable to control his right foot and would stumble and need assist to keep from falling.  In the gym worked on dynamic standing balance by having pt reach for therapist's hand in different planes in order to promote weightshift to the right and left as well as forward.  Pt able to perform reaching with min guard assist.  However, when attempting to take steps with hand held assist he was unable to keep his balance or control his LLE with stepping.  Ambulated back to the room with the RW at end of session.  Therapy Documentation Precautions:  Precautions Precautions: Fall Precaution Comments: NPO NG tube Restrictions Weight Bearing Restrictions: No  Pain: Pain Assessment Pain Assessment: No/denies pain ADL: See FIM for current functional  status  Therapy/Group: Individual Therapy  Rumi Taras OTR/L 12/12/2012, 12:49 PM

## 2012-12-13 ENCOUNTER — Inpatient Hospital Stay (HOSPITAL_COMMUNITY): Payer: Medicaid Other

## 2012-12-13 ENCOUNTER — Inpatient Hospital Stay (HOSPITAL_COMMUNITY): Payer: Medicaid Other | Admitting: Occupational Therapy

## 2012-12-13 ENCOUNTER — Inpatient Hospital Stay (HOSPITAL_COMMUNITY): Payer: Medicaid Other | Admitting: Physical Therapy

## 2012-12-13 ENCOUNTER — Inpatient Hospital Stay (HOSPITAL_COMMUNITY): Payer: Medicaid Other | Admitting: Speech Pathology

## 2012-12-13 LAB — GLUCOSE, CAPILLARY
Glucose-Capillary: 97 mg/dL (ref 70–99)
Glucose-Capillary: 108 mg/dL — ABNORMAL HIGH (ref 70–99)

## 2012-12-13 MED ORDER — POTASSIUM CHLORIDE 20 MEQ/15ML (10%) PO LIQD
20.0000 meq | Freq: Every day | ORAL | Status: DC
  Administered 2012-12-14 – 2012-12-22 (×8): 20 meq via ORAL
  Filled 2012-12-13 (×11): qty 15

## 2012-12-13 MED ORDER — PANTOPRAZOLE SODIUM 40 MG PO PACK
40.0000 mg | PACK | Freq: Every day | ORAL | Status: DC
  Administered 2012-12-13 – 2012-12-22 (×8): 40 mg
  Filled 2012-12-13 (×14): qty 20

## 2012-12-13 MED ORDER — ALBUTEROL SULFATE (5 MG/ML) 0.5% IN NEBU
2.5000 mg | INHALATION_SOLUTION | Freq: Four times a day (QID) | RESPIRATORY_TRACT | Status: DC | PRN
  Filled 2012-12-13: qty 0.5

## 2012-12-13 MED ORDER — BACLOFEN 10 MG PO TABS
10.0000 mg | ORAL_TABLET | Freq: Four times a day (QID) | ORAL | Status: DC
  Administered 2012-12-13 (×3): 10 mg via ORAL
  Filled 2012-12-13 (×8): qty 1

## 2012-12-13 NOTE — Progress Notes (Signed)
Occupational Therapy Session Note  Patient Details  Name: [name] MRN: [id number] Date of Birth: 1950-11-03  Today's Date: 12/13/2012 Time: 2130-8657 Time Calculation (min): 45 min  Short Term Goals: Week 1:  OT Short Term Goal 1 (Week 1): Pt. will bath self using sit to stand with supervision OT Short Term Goal 2 (Week 1): Pt. will groom self with standing at sup for 5 minutes OT Short Term Goal 3 (Week 1): Pt. will dress self at supervision level OT Short Term Goal 4 (Week 1): Pt. will engage in toilet transfer at supervision level OT Short Term Goal 5 (Week 1): Pt. will transfer to shower stall at minimal assist level  Skilled Therapeutic Interventions/Progress Updates:    Pt worked on dynamic balance and functional weightshifts in the gym.  With transition sit to stand pt demonstrates decreased adequate trunk flexion and at times has to stabilize his LEs up against the mat surface to achieve sit to stand.  Demonstrates increased weightshift to the right as well in standing and with transitions sit to stand.  Worked on lateral reaching to the left side to move rings from one side of the ROM arc to the other, incorporating increased weightshift to the LLE.  Also worked in squat position to place rings as well and maintain balance and midline weight distribution and to the left.  Progressed to stepping forward and back with the RLE.  Pt demonstrates decreased weightshift forward over the right foot and just tends to place his leg forward, with slight scissoring motion.  Mod assist for functional mobility during simulated toilet transfer after performing weightshift tasks.  Therapy Documentation Precautions:  Precautions Precautions: Fall Precaution Comments: NPO NG tube Restrictions Weight Bearing Restrictions: No  Vital Signs: Therapy Vitals Temp: 97.8 F (36.6 C) Temp src: Oral Pulse Rate: 84 Resp: 18 BP: 128/86 mmHg Patient Position, if appropriate: Sitting Oxygen  Therapy SpO2: 94 % O2 Device: None (Room air) Pain: Pain Assessment Pain Assessment: No/denies pain ADL: See FIM for current functional status  Therapy/Group: Individual Therapy  Verneice Caspers OTR/L 12/13/2012, 3:26 PM

## 2012-12-13 NOTE — Progress Notes (Signed)
Physical Therapy Session Note  Patient Details  Name: [name] MRN: [id number] Date of Birth: November 06, 1950  Today's Date: 12/13/2012 Time: 7829-5621 Time Calculation (min): 39 min  Short Term Goals: Week 1:  PT Short Term Goal 1 (Week 1): Pt will perform functional transfers with min A PT Short Term Goal 2 (Week 1): Pt will gait in controlled environment min A 150'  Therapy Documentation Precautions:  Precautions Precautions: Fall Precaution Comments: NPO NG tube Restrictions Weight Bearing Restrictions: No General: Amount of Missed PT Time (min): 21 Minutes Missed Time Reason: X-Ray Vital Signs: Therapy Vitals Temp: 97.8 F (36.6 C) Temp src: Oral Pulse Rate: 84 Resp: 18 BP: 128/86 mmHg Patient Position, if appropriate: Sitting Oxygen Therapy SpO2: 94 % O2 Device: None (Room air) Pain: Pain Assessment Pain Assessment: No/denies pain Mobility:  Patient performed w/c <> mat transfers squat pivot mod A with improved grading of movement; performed stand pivot to Nustep with RW and min-mod A overall with assistance to pivot RW and manual facilitation for lateral weight shifting to pivot feet.   Locomotion : Ambulation Ambulation/Gait Assistance: 2: Max Scientist, clinical (histocompatibility and immunogenetics): 150 with supervision Other Treatments: Treatments Neuromuscular Facilitation: Right;Left;Upper Extremity;Lower Extremity;Activity to increase coordination;Activity to increase motor control;Activity to increase timing and sequencing;Activity to increase grading;Activity to increase lateral weight shifting;Activity to increase anterior-posterior weight shifting beginning on Nustep at level 6 x 8 minutes with bilat UE and LE for coordination + 2 min with LE only.  Continued coordination training with ambulation with bilat UE support on walking poles with focus on coordinating contralateral UE and LE forward progression for trunk and pelvic rotation x 25' with max-total A at times with total  verbal cues for sequencing and at times total A needed to maintain balance secondary to LE giving out on patient; patient ambulating with very wide BOS and very short step length and significant R lateral lean.  Changed to standing with bilat UE support on tall rolling table to facilitate upright trunk during R and L lateral stepping, forwards and retro stepping x 25' each direction with mod A to facilitate L lateral weight shifting to allow full RLE clearance and step length.    See FIM for current functional status  Therapy/Group: Individual Therapy  [name] [facility] 12/13/2012, 4:58 PM

## 2012-12-13 NOTE — Progress Notes (Signed)
Occupational Therapy Session Note  Patient Details  Name: [name] MRN: [id number] Date of Birth: [birth year]  Today's Date: 12/13/2012 Time: 1105-1205 Time Calculation (min): 60 min  Short Term Goals: Week 1:  OT Short Term Goal 1 (Week 1): Pt. will bath self using sit to stand with supervision OT Short Term Goal 2 (Week 1): Pt. will groom self with standing at sup for 5 minutes OT Short Term Goal 3 (Week 1): Pt. will dress self at supervision level OT Short Term Goal 4 (Week 1): Pt. will engage in toilet transfer at supervision level OT Short Term Goal 5 (Week 1): Pt. will transfer to shower stall at minimal assist level  Skilled Therapeutic Interventions/Progress Updates:  Self care retraining to include sponge bath and dress at EOB to include sit and stand.  Addressed simple with focus on gathering dirty clothes and putting them away, opening and closing each closet and drawer in his hospital room, walker safety, functional mobility to include dynamic reaching high and low, walking backwards and sideways, and turning.  Patient's assist level for functional mobility varied from close supervision to mod A with LOB while backing up and some turns.  Also addressed shower transfers to include dry run to safely transfer into the shower stall in his room and practice shower transfer set up similar to home environment with ledge to step over.  Patient was min assist with simulated shower transfer.  Therapy Documentation Precautions:  Precautions Precautions: Fall Precaution Comments: NPO NG tube Restrictions Weight Bearing Restrictions: No Pain: Pain Assessment Pain Assessment: No/denies pain ADL: See FIM for current functional status  Therapy/Group: Individual Therapy  Dalynn Jhaveri 12/13/2012, 12:17 PM

## 2012-12-13 NOTE — Progress Notes (Signed)
Speech Language Pathology Daily Session Note  Patient Details  Name: VAHE PIENTA MRN: [id number] Date of Birth: [birth year]  Today's Date: 12/13/2012 Time: 0905-0930 Time Calculation (min): 25 min  Short Term Goals: Week 1: SLP Short Term Goal 1 (Week 1): Pt will self-monitor and correct wet vocal quality with Mod verbal and question cues.  SLP Short Term Goal 2 (Week 1): Pt will perform pharyngeal strengthening exercises with Min verbal cues.  SLP Short Term Goal 3 (Week 1): Pt will consume trials of ice chips with minimal overt s/s of aspiration with 50% of trials with Mod A verbal cues.  SLP Short Term Goal 4 (Week 1): Pt will utilize speech intelligibility strategies with Mod A verbal and question cues for 75% intelligibility at the conversation level SLP Short Term Goal 5 (Week 1): Pt will demonstrate emergent awareness of swallowing impairments with Mod A question and semantic cues.  Skilled Therapeutic Interventions: Skilled treatment session focused on addressing dysphagia goals.  SLP facilitated session with set up and supervision level verbal cues to perform oral care with suction toothbrush.  Patient required max assist cues to self-monitor and clear wet vocal quality.  SLP also facilitated session with mod assist verbal and demonstrative cues to perform 2 hard effortful swallows per teaspoon sip of water followed by a throat clear and then another 2 hard effortful swallows.  Once trials began patient demonstrated timely swallows with 5/5 teaspoon sips but after a delay patient began hiccupping and reported feeling SOB cues for deep breaths from Physician's Assistant were effective and resolving reports and resulted in coughs.  Patient reported that NG tube was irritating his throat throughout session.  Recommend repeat objective study tomorrow to determine if p.o. goals during CIR stay are reasonable.      FIM:  Comprehension Comprehension Mode: Auditory Comprehension: 5-Understands  complex 90% of the time/Cues < 10% of the time Expression Expression Mode: Verbal Expression: 4-Expresses basic 75 - 89% of the time/requires cueing 10 - 24% of the time. Needs helper to occlude trach/needs to repeat words. Social Interaction Social Interaction: 5-Interacts appropriately 90% of the time - Needs monitoring or encouragement for participation or interaction. Problem Solving Problem Solving: 5-Solves basic 90% of the time/requires cueing < 10% of the time Memory Memory: 5-Recognizes or recalls 90% of the time/requires cueing < 10% of the time FIM - Eating Eating Activity: 5: Needs verbal cues/supervision  Pain Pain Assessment Pain Assessment: No/denies pain  Therapy/Group: Individual Therapy  Charlane Ferretti., CCC-SLP [phone]  Fatoumata Albaugh 12/13/2012, 10:22 AM

## 2012-12-13 NOTE — Progress Notes (Signed)
Patient ID: [name], male   DOB: May 14, 1951, 62 y.o.   MRN: [id number] Subjective/Complaints: Hiccups a little better No SOB  Review of Systems  Respiratory: Positive for cough. Negative for shortness of breath.        Fast breathing  Gastrointestinal: Positive for nausea. Negative for vomiting.     Objective: Vital Signs: Blood pressure 159/81, pulse 88, temperature 98 F (36.7 C), temperature source Oral, resp. rate 20, height 5\' 8"  (1.727 m), weight 74.6 kg (164 lb 7.4 oz), SpO2 97.00%. No results found. Results for orders placed during the hospital encounter of 12/09/12 (from the past 72 hour(s))  GLUCOSE, CAPILLARY     Status: None   Collection Time    12/10/12 11:43 AM      Result Value Range   Glucose-Capillary 91  70 - 99 mg/dL  GLUCOSE, CAPILLARY     Status: Abnormal   Collection Time    12/10/12  4:15 PM      Result Value Range   Glucose-Capillary 105 (*) 70 - 99 mg/dL  GLUCOSE, CAPILLARY     Status: None   Collection Time    12/10/12  9:53 PM      Result Value Range   Glucose-Capillary 90  70 - 99 mg/dL  GLUCOSE, CAPILLARY     Status: Abnormal   Collection Time    12/11/12  6:27 AM      Result Value Range   Glucose-Capillary 104 (*) 70 - 99 mg/dL  GLUCOSE, CAPILLARY     Status: Abnormal   Collection Time    12/11/12  7:24 AM      Result Value Range   Glucose-Capillary 103 (*) 70 - 99 mg/dL  GLUCOSE, CAPILLARY     Status: Abnormal   Collection Time    12/11/12 11:27 AM      Result Value Range   Glucose-Capillary 118 (*) 70 - 99 mg/dL  GLUCOSE, CAPILLARY     Status: Abnormal   Collection Time    12/11/12  4:12 PM      Result Value Range   Glucose-Capillary 121 (*) 70 - 99 mg/dL  GLUCOSE, CAPILLARY     Status: Abnormal   Collection Time    12/11/12  9:11 PM      Result Value Range   Glucose-Capillary 118 (*) 70 - 99 mg/dL   Comment 1 Notify RN    CBC WITH DIFFERENTIAL     Status: Abnormal   Collection Time    12/12/12  5:10 AM      Result  Value Range   WBC 8.0  4.0 - 10.5 K/uL   RBC 4.44  4.22 - 5.81 MIL/uL   Hemoglobin 13.1  13.0 - 17.0 g/dL   HCT 57.8 (*) 46.9 - 62.9 %   MCV 86.7  78.0 - 100.0 fL   MCH 29.5  26.0 - 34.0 pg   MCHC 34.0  30.0 - 36.0 g/dL   RDW 52.8  41.3 - 24.4 %   Platelets 294  150 - 400 K/uL   Neutrophils Relative 54  43 - 77 %   Neutro Abs 4.3  1.7 - 7.7 K/uL   Lymphocytes Relative 30  12 - 46 %   Lymphs Abs 2.4  0.7 - 4.0 K/uL   Monocytes Relative 14 (*) 3 - 12 %   Monocytes Absolute 1.1 (*) 0.1 - 1.0 K/uL   Eosinophils Relative 2  0 - 5 %   Eosinophils Absolute 0.2  0.0 -  0.7 K/uL   Basophils Relative 0  0 - 1 %   Basophils Absolute 0.0  0.0 - 0.1 K/uL  COMPREHENSIVE METABOLIC PANEL     Status: Abnormal   Collection Time    12/12/12  5:10 AM      Result Value Range   Sodium 142  135 - 145 mEq/L   Potassium 4.1  3.5 - 5.1 mEq/L   Chloride 109  96 - 112 mEq/L   CO2 27  19 - 32 mEq/L   Glucose, Bld 125 (*) 70 - 99 mg/dL   BUN 21  6 - 23 mg/dL   Creatinine, Ser 1.61  0.50 - 1.35 mg/dL   Calcium 09.6  8.4 - 04.5 mg/dL   Total Protein 8.1  6.0 - 8.3 g/dL   Albumin 3.2 (*) 3.5 - 5.2 g/dL   AST 41 (*) 0 - 37 U/L   ALT 41  0 - 53 U/L   Alkaline Phosphatase 79  39 - 117 U/L   Total Bilirubin 0.2 (*) 0.3 - 1.2 mg/dL   GFR calc non Af Amer 87 (*) >90 mL/min   GFR calc Af Amer >90  >90 mL/min   Comment:            The eGFR has been calculated     using the CKD EPI equation.     This calculation has not been     validated in all clinical     situations.     eGFR's persistently     <90 mL/min signify     possible Chronic Kidney Disease.  GLUCOSE, CAPILLARY     Status: Abnormal   Collection Time    12/12/12  5:31 AM      Result Value Range   Glucose-Capillary 110 (*) 70 - 99 mg/dL   Comment 1 Notify RN    GLUCOSE, CAPILLARY     Status: Abnormal   Collection Time    12/12/12  7:27 AM      Result Value Range   Glucose-Capillary 110 (*) 70 - 99 mg/dL  GLUCOSE, CAPILLARY     Status:  Abnormal   Collection Time    12/12/12 11:33 AM      Result Value Range   Glucose-Capillary 107 (*) 70 - 99 mg/dL  GLUCOSE, CAPILLARY     Status: None   Collection Time    12/12/12  5:09 PM      Result Value Range   Glucose-Capillary 87  70 - 99 mg/dL  GLUCOSE, CAPILLARY     Status: Abnormal   Collection Time    12/12/12  9:30 PM      Result Value Range   Glucose-Capillary 118 (*) 70 - 99 mg/dL  GLUCOSE, CAPILLARY     Status: Abnormal   Collection Time    12/13/12  6:12 AM      Result Value Range   Glucose-Capillary 127 (*) 70 - 99 mg/dL     HEENT: oromotor weakness Cardio: RRR and no murmur Resp: Rales and upper airway sounds, unlabored, using Yankauer suction GI: BS positive and non tender Extremity:  Pulses positive and No Edema Skin:   Intact Neuro: Alert/Oriented, Cranial Nerve Abnormalities CN 9 and 10, , Abnormal Motor 4/5 in BUE and BLE, Abnormal FMC Ataxic/ dec FMC and Dysarthric Musc/Skel:  Normal Gen NAD, hiccups   Assessment/Plan: 1. 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 Physiatrist  is providing close team supervision and 24 hour management of active medical problems listed below. Physiatrist and rehab team continue to assess barriers to discharge/monitor patient progress toward functional and medical goals. FIM: FIM - Bathing Bathing Steps Patient Completed: Right Arm;Left Arm;Chest;Abdomen Bathing: 5: Supervision: Safety issues/verbal cues  FIM - Upper Body Dressing/Undressing Upper body dressing/undressing steps patient completed: Thread/unthread right sleeve of pullover shirt/dresss;Thread/unthread left sleeve of pullover shirt/dress;Put head through opening of pull over shirt/dress;Pull shirt over trunk Upper body dressing/undressing: 5: Supervision: Safety issues/verbal cues FIM - Lower Body Dressing/Undressing Lower body  dressing/undressing steps patient completed: Thread/unthread right pants leg;Thread/unthread left pants leg;Pull pants up/down;Don/Doff right sock;Don/Doff left sock Lower body dressing/undressing: 0: Activity did not occur  FIM - Toileting Toileting steps completed by patient: Performs perineal hygiene Toileting Assistive Devices: Grab bar or rail for support Toileting: 4: Steadying assist  FIM - Diplomatic Services operational officer Devices: Grab bars Toilet Transfers: 4-To toilet/BSC: Min A (steadying Pt. > 75%);4-From toilet/BSC: Min A (steadying Pt. > 75%)  FIM - Bed/Chair Transfer Bed/Chair Transfer Assistive Devices: Arm rests;HOB elevated Bed/Chair Transfer: 5: Bed > Chair or W/C: Supervision (verbal cues/safety issues);5: Chair or W/C > Bed: Supervision (verbal cues/safety issues)  FIM - Locomotion: Wheelchair Distance: 150 Locomotion: Wheelchair: 5: Travels 150 ft or more: maneuvers on rugs and over door sills with supervision, cueing or coaxing FIM - Locomotion: Ambulation Ambulation/Gait Assistance: 3: Mod assist Locomotion: Ambulation: 0: Activity did not occur  Comprehension Comprehension Mode: Auditory Comprehension: 5-Understands complex 90% of the time/Cues < 10% of the time  Expression Expression Mode: Verbal Expression: 4-Expresses basic 75 - 89% of the time/requires cueing 10 - 24% of the time. Needs helper to occlude trach/needs to repeat words.  Social Interaction Social Interaction: 5-Interacts appropriately 90% of the time - Needs monitoring or encouragement for participation or interaction.  Problem Solving Problem Solving: 5-Solves basic 90% of the time/requires cueing < 10% of the time  Memory Memory: 5-Requires cues to use assistive device  Medical Problem List and Plan:  1. DVT Prophylaxis/Anticoagulation: Pharmaceutical: Lovenox  2. Pain Management: N/A  3. Mood: Will have LCSW follow for evaluation.  4. Neuropsych: This patient is not  capable of making decisions on his/her own behalf.  5. HTN: Monitor with bid checks. Continue HCTZ and Prinivil for now. May need to re-evaluate diuretic in face of modified diet and acute renal insufficiency.  6. COPD: Continue spiriva. Prn nebs for SOB. Keep HOB elevated at 30 degrees. Encourage IS.  7. Dysphagia: Continue NPO due to severe dysphagia. TF for nutritional support.  8.  Hiccups, baclofen titrate upwards LOS (Days) 4 A FACE TO FACE EVALUATION WAS PERFORMED  Estoria Geary,Abrahim E 12/13/2012, 8:11 AM

## 2012-12-14 ENCOUNTER — Inpatient Hospital Stay (HOSPITAL_COMMUNITY): Payer: Medicaid Other | Admitting: Occupational Therapy

## 2012-12-14 ENCOUNTER — Inpatient Hospital Stay (HOSPITAL_COMMUNITY): Payer: Medicaid Other | Admitting: Speech Pathology

## 2012-12-14 ENCOUNTER — Inpatient Hospital Stay (HOSPITAL_COMMUNITY): Payer: Medicaid Other

## 2012-12-14 ENCOUNTER — Inpatient Hospital Stay (HOSPITAL_COMMUNITY): Payer: Medicaid Other | Admitting: Physical Therapy

## 2012-12-14 LAB — GLUCOSE, CAPILLARY
Glucose-Capillary: 104 mg/dL — ABNORMAL HIGH (ref 70–99)
Glucose-Capillary: 106 mg/dL — ABNORMAL HIGH (ref 70–99)
Glucose-Capillary: 117 mg/dL — ABNORMAL HIGH (ref 70–99)

## 2012-12-14 MED ORDER — GUAIFENESIN ER 600 MG PO TB12
1200.0000 mg | ORAL_TABLET | Freq: Two times a day (BID) | ORAL | Status: DC
  Filled 2012-12-14 (×3): qty 2

## 2012-12-14 MED ORDER — ACETAMINOPHEN 325 MG PO TABS
325.0000 mg | ORAL_TABLET | ORAL | Status: DC | PRN
  Administered 2012-12-21: 650 mg via ORAL
  Administered 2012-12-21: 325 mg via ORAL
  Administered 2012-12-22 – 2013-01-01 (×2): 650 mg via ORAL
  Filled 2012-12-14 (×4): qty 2

## 2012-12-14 MED ORDER — GUAIFENESIN 100 MG/5ML PO SYRP
400.0000 mg | ORAL_SOLUTION | Freq: Three times a day (TID) | ORAL | Status: DC
  Filled 2012-12-14 (×3): qty 118

## 2012-12-14 MED ORDER — BACLOFEN 20 MG PO TABS
20.0000 mg | ORAL_TABLET | Freq: Three times a day (TID) | ORAL | Status: DC
  Administered 2012-12-14 – 2012-12-22 (×23): 20 mg via ORAL
  Filled 2012-12-14 (×28): qty 1

## 2012-12-14 MED ORDER — PROCHLORPERAZINE MALEATE 5 MG PO TABS
5.0000 mg | ORAL_TABLET | Freq: Four times a day (QID) | ORAL | Status: DC | PRN
  Filled 2012-12-14: qty 2

## 2012-12-14 MED ORDER — PROCHLORPERAZINE EDISYLATE 5 MG/ML IJ SOLN
5.0000 mg | Freq: Four times a day (QID) | INTRAMUSCULAR | Status: DC | PRN
  Filled 2012-12-14: qty 2

## 2012-12-14 MED ORDER — ALBUTEROL SULFATE HFA 108 (90 BASE) MCG/ACT IN AERS
2.0000 | INHALATION_SPRAY | Freq: Four times a day (QID) | RESPIRATORY_TRACT | Status: DC
  Filled 2012-12-14: qty 6.7

## 2012-12-14 MED ORDER — TRAZODONE HCL 50 MG PO TABS: 25.0000 mg | ORAL_TABLET | Freq: Every evening | ORAL | Status: DC | PRN

## 2012-12-14 MED ORDER — PROCHLORPERAZINE 25 MG RE SUPP
12.5000 mg | Freq: Four times a day (QID) | RECTAL | Status: DC | PRN
  Filled 2012-12-14: qty 1

## 2012-12-14 MED ORDER — ALBUTEROL SULFATE HFA 108 (90 BASE) MCG/ACT IN AERS
2.0000 | INHALATION_SPRAY | Freq: Two times a day (BID) | RESPIRATORY_TRACT | Status: DC
  Administered 2012-12-14 – 2013-01-02 (×35): 2 via RESPIRATORY_TRACT
  Filled 2012-12-14 (×2): qty 6.7

## 2012-12-14 MED ORDER — GUAIFENESIN 100 MG/5ML PO SOLN
20.0000 mL | Freq: Three times a day (TID) | ORAL | Status: DC
  Administered 2012-12-14 – 2012-12-22 (×30): 400 mg via ORAL
  Filled 2012-12-14 (×38): qty 20

## 2012-12-14 NOTE — Progress Notes (Addendum)
Occupational Therapy Session Note  Patient Details  Name: [name] MRN: [id number] Date of Birth: 05/10/1951  Today's Date: 12/14/2012 Time: 2440-1027 and 2536-6440 Time Calculation (min): 57 min and 40 min  Short Term Goals: Week 1:  OT Short Term Goal 1 (Week 1): Pt. will bath self using sit to stand with supervision OT Short Term Goal 2 (Week 1): Pt. will groom self with standing at sup for 5 minutes OT Short Term Goal 3 (Week 1): Pt. will dress self at supervision level OT Short Term Goal 4 (Week 1): Pt. will engage in toilet transfer at supervision level OT Short Term Goal 5 (Week 1): Pt. will transfer to shower stall at minimal assist level  Skilled Therapeutic Interventions/Progress Updates:    1) Pt seen for ADL retraining at sit to stand level at sink.  Addressed functional mobility with focus on gathering dirty towels and putting them away, opening and closing closet and room doors, walker safety with turning and crossing thresholds. Pt's assist level for functional mobility varied from close supervision to mod A with LOB while backing up and some turns as he tends to push his walker out of the way with turns. Pt with frequent lean to Rt with ambulation and shuffling gait when tired, becoming more unsafe. Engaged in standing activity without UE, pt demonstrated 2 LOB in static standing with simple reaching task within BOS.  2) Pt seen for 1:1 OT with focus on static and dynamic standing balance with and without UE support.  Engaged in reaching outside BOS with RUE to obtain items and place in order while integrating crossing midline.  Pt required min/steady assist secondary to RLE weakness, however completed task with no episodes of knee buckling.  Increased challenge to integrating 3" foam to increase balance challenge.  Pt required min assist when reaching outside BOS, especially when crossing midline towards Lt.  Use of RUE with wiping down vertical mirror.  Completed pipe tree  activity in standing with again min/steady assist to maintain standing balance. Pt requested seated rest break prior to PT session immediately following OT session.  Therapy Documentation Precautions:  Precautions Precautions: Fall Precaution Comments: NPO NG tube Restrictions Weight Bearing Restrictions: No General:   Vital Signs: Therapy Vitals Temp: 98.1 F (36.7 C) Temp src: Oral Pulse Rate: 76 Resp: 20 BP: 142/88 mmHg Oxygen Therapy SpO2: 96 % O2 Device: None (Room air) Pain:  Pt with no c/o pain this session.  See FIM for current functional status  Therapy/Group: Individual Therapy  Leonette Monarch 12/14/2012, 9:07 AM

## 2012-12-14 NOTE — Procedures (Signed)
**Note Terry-Identified via Obfuscation** Objective Swallowing Evaluation:    Patient Details  Name: [name] MRN: [id number] Date of Birth: [birth year]  Today's Date: 12/14/2012 Time: 4098-1191 Time Calculation (min): 30 min  Past Medical History:  Past Medical History  Diagnosis Date  . Hypertension 05/18/2007  . COPD 05/18/2007  . Elevated PSA 05/25/2007  . 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 Stroke 12/05/2012   Past Surgical History:  Past Surgical History  Procedure Laterality Date  . 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 Bedside swallow eval resulted in recs for NPO and MBS.  MBS completed 12/06/12 recommended NPO due to severe dysphagia.  Patient admtted to CIR and has been participating in therapies with imporved bedside presentation.         Recommendation/Prognosis  Clinical Impression Dysphagia Diagnosis: Mild oral phase dysphagia;Moderate pharyngeal phase dysphagia;Moderate cervical esophageal phase dysphagia Clinical impression: Patient demonstrates functional improvement compared to his previous objective study.  Dysphagia persists with mild oral weakness and moderate pharyngeal sensory and motor impairments.  Of note, cervical esophageal impairments also observed; however, are suspected to be baseline due to observed boney prominences on cervical spine.  Patient's pharyngeal phase of swallow was characterized by reduced base of tongue strength and hyolaryngeal excursion, leading to incomplete epiglottis deflection and reduced airway protection.  Additionally, post swallow residuals in the valleculea, pyriform sinuses and above the UES resulted in intermittent episodes of penetration.   Thin resulted in decreased residuals, along with clinician cues to perform 2 swallows followed by a cough with 2 more swallows this was effective at reducing penetrates.  Standing secretions were observed in the pharynx  post swallows and appeared to trigger cough between trials.  Eventually patient was able to orally expectorate these secretions.   Patient is at moderate aspiration risk due to residue.  SLP educated patient regarding option of re-placing temporary alternative means and continuing to address pharyngeal deficits during therapy with SLP provided p.o. trials; however, patient refused replacing "the tube."  As a result, it is recommended that he initiate a diet modified diet with strict use of safe swallow precautions to reduce risk of aspiration.  Given aspiration risk and continued need to reinforce safe swallow strategies full staff supervision is recommended along with continued dysphagia therapy.    Swallow Evaluation Recommendations Diet Recommendations: Dysphagia 2 (Fine chop);Thin liquid Liquid Administration via: Cup;No straw Medication Administration: Crushed with puree Supervision: Patient able to self feed;Full supervision/cueing for compensatory strategies Compensations: Slow rate;Small sips/bites;Multiple dry swallows after each bite/sip;Hard cough after swallow;Effortful swallow Postural Changes and/or Swallow Maneuvers: Out of bed for meals;Seated upright 90 degrees;Upright 30-60 min after meal Oral Care Recommendations: Oral care BID Follow up Recommendations: 24 hour supervision/assistance;Home health SLP Prognosis Prognosis for Safe Diet Advancement: Fair Barriers to Reach Goals:  (baseline function) Individuals Consulted Consulted and Agree with Results and Recommendations: Patient  SLP Assessment/Plan Dysphagia Diagnosis: Mild oral phase dysphagia;Moderate pharyngeal phase dysphagia;Moderate cervical esophageal phase dysphagia Clinical impression: Patient demonstrates functional improvement compared to his previous objective study.  Dysphagia persists with mild oral weakness and moderate pharyngeal sensory and motor impairments.  Of note, cervical esophageal impairments also  observed; however, are suspected to be baseline due to observed boney prominences on cervical spine.  Patient's pharyngeal phase of swallow was characterized by reduced base of tongue strength and hyolaryngeal excursion, leading to incomplete epiglottis deflection and reduced airway  protection.  Additionally, post swallow residuals in the valleculea, pyriform sinuses and above the UES resulted in intermittent episodes of penetration.   Thin resulted in decreased residuals, along with clinician cues to perform 2 swallows followed by a cough with 2 more swallows this was effective at reducing penetrates.  Standing secretions were observed in the pharynx post swallows and appeared to trigger cough between trials.  Eventually patient was able to orally expectorate these secretions.   Patient is at moderate aspiration risk due to residue.  SLP educated patient regarding option of re-placing temporary alternative means and continuing to address pharyngeal deficits during therapy with SLP provided p.o. trials; however, patient refused replacing "the tube."  As a result, it is recommended that he initiate a diet modified diet with strict use of safe swallow precautions to reduce risk of aspiration.  Given aspiration risk and continued need to reinforce safe swallow strategies full staff supervision is recommended along with continued dysphagia therapy.    Short Term Goals: Week 1: SLP Short Term Goal 1 (Week 1): Pt will self-monitor and correct wet vocal quality with Mod verbal and question cues.  SLP Short Term Goal 2 (Week 1): Pt will perform pharyngeal strengthening exercises with Min verbal cues.  SLP Short Term Goal 3 (Week 1): Pt will consume trials of ice chips with minimal overt s/s of aspiration with 50% of trials with Mod A verbal cues.  SLP Short Term Goal 4 (Week 1): Pt will utilize speech intelligibility strategies with Mod A verbal and question cues for 75% intelligibility at the conversation level SLP  Short Term Goal 5 (Week 1): Pt will demonstrate emergent awareness of swallowing impairments with Mod A question and semantic cues. SLP Short Term Goal 6 (Week 1): Patient will consume Dys.2 textures and thin liquids with mod assist clinician cues to follow safe swallow strategies.  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 Bedside swallow eval resulted in recs for NPO and MBS.  MBS completed 12/06/12 recommended NPO due to severe dysphagia.  Patient admtted to CIR and has been participating in therapies with imporved bedside presentation.     Reason for Referral: Objectively evaluate swallowing function Previous Swallow Assessment: MBS on 12/06/12 and recommended to conitnue NPO status due to severe pharyngeal dysphagia Respiratory Status: Room air History of Recent Intubation: No Behavior/Cognition: Alert;Cooperative Oral Cavity - Dentition: Adequate natural dentition;Poor condition Oral Motor / Sensory Function: Impaired - see Bedside swallow eval Baseline Vocal Quality: Low vocal intensity;Wet Volitional Cough: Strong Volitional Swallow: Able to elicit Anatomy: Within functional limits Pharyngeal Secretions:  (observed post swallow with barium residuals; orally expect )  Reason for Referral:  Objectively evaluate swallowing function   Oral Phase   Pharyngeal Phase  Pharyngeal Phase Pharyngeal Phase: Impaired Pharyngeal - Nectar Pharyngeal - Nectar Teaspoon: Reduced epiglottic inversion;Reduced laryngeal elevation;Reduced tongue base retraction;Reduced airway/laryngeal closure;Penetration/Aspiration after swallow;Penetration/Aspiration before swallow;Trace aspiration;Pharyngeal residue - valleculae;Pharyngeal residue - pyriform sinuses;Pharyngeal residue - cp segment;Compensatory strategies attempted (Comment) (1st trial resulted in aspiration before all others pen.after) Penetration/Aspiration  details (nectar teaspoon): Material enters airway, passes BELOW cords and not ejected out despite cough attempt by patient;Material enters airway, remains ABOVE vocal cords then ejected out Pharyngeal - Nectar Cup: Not tested Pharyngeal - Thin Pharyngeal - Thin Teaspoon: Reduced epiglottic inversion;Reduced laryngeal elevation;Reduced airway/laryngeal closure;Reduced tongue base retraction;Penetration/Aspiration after swallow;Pharyngeal residue - valleculae;Pharyngeal residue - pyriform sinuses;Pharyngeal residue - cp segment;Compensatory strategies attempted (Comment) (  2 swallows, cough, 2 swallows) Penetration/Aspiration details (thin teaspoon): Material enters airway, remains ABOVE vocal cords then ejected out Pharyngeal - Thin Cup: Reduced epiglottic inversion;Reduced laryngeal elevation;Reduced airway/laryngeal closure;Reduced tongue base retraction;Penetration/Aspiration after swallow;Pharyngeal residue - valleculae;Pharyngeal residue - pyriform sinuses;Pharyngeal residue - cp segment;Compensatory strategies attempted (Comment) (2 swallows, cough, 2 swallows effective) Penetration/Aspiration details (thin cup): Material enters airway, remains ABOVE vocal cords then ejected out Pharyngeal - Solids Pharyngeal - Puree: Reduced epiglottic inversion;Reduced laryngeal elevation;Reduced tongue base retraction;Reduced airway/laryngeal closure;Penetration/Aspiration after swallow;Pharyngeal residue - valleculae;Pharyngeal residue - pyriform sinuses;Pharyngeal residue - cp segment;Compensatory strategies attempted (Comment) (2 swallows, cough, 2 swallows effective) Penetration/Aspiration details (puree): Material enters airway, remains ABOVE vocal cords then ejected out Pharyngeal - Mechanical Soft: Reduced epiglottic inversion;Reduced laryngeal elevation;Reduced airway/laryngeal closure;Reduced tongue base retraction;Penetration/Aspiration after swallow;Pharyngeal residue - valleculae;Pharyngeal residue -  pyriform sinuses;Pharyngeal residue - cp segment;Compensatory strategies attempted (Comment) (2 swallows, cough, 2 swallows effective) Penetration/Aspiration details (mechanical soft): Material enters airway, remains ABOVE vocal cords then ejected out Pharyngeal Phase - Comment Pharyngeal Comment: with barium residue secreations were observed and appeared to be thick and sticky in nature, difficult for patient to clear with hard effortful swallows; however he was able to orally expectorate them Cervical Esophageal Phase  Cervical Esophageal Phase Cervical Esophageal Phase: Impaired Cervical Esophageal Phase - Nectar Nectar Teaspoon: Esophageal backflow into cervical esophagus  [name]., CCC-SLP [phone]  [name] 12/14/2012, 5:37 PM

## 2012-12-14 NOTE — Progress Notes (Signed)
Inpatient RehabilitationTeam Conference and Plan of Care Update Date: 12/14/2012   Time: 3:36 PM    Patient Name: [name]      Medical Record Number: [id number]  Date of Birth: Jan 15, 1951 Sex: Male         Room/Bed: 4146/4146-01 Payor Info: Payor: MEDICAID [place]  Plan: MEDICAID [place] ACCESS  Product Type: *No Product type*     Admitting Diagnosis: CVA, SPEECH THEP  Admit Date/Time:  12/09/2012  7:04 PM Admission Comments: No comment available   Primary Diagnosis:  Stroke, acute, embolic Principal Problem: Stroke, acute, embolic  Patient Active Problem List   Diagnosis Date Noted  . Hypertensive emergency 12/06/2012  . Stroke, acute, embolic 12/06/2012  . Malignant hypertension 12/05/2012  . Cerebral embolism with cerebral infarction 12/05/2012  . Dyslipidemia 12/05/2012  . Alcohol abuse 12/05/2012  . Hypokalemia 12/05/2012  . Claudication, intermittent 11/07/2012  . Smokes tobacco daily 10/18/2012  . History of stroke 10/14/2010  . Elevated PSA 05/25/2007  . Hypertension 05/18/2007  . COPD 05/18/2007    Expected Discharge Date:  12/23/12   Team Members Present:  Dr. Claudette Laws, Melanee Spry, [address] Tora Duck, Bretta Bang, [place], Kayla Perkinson, Sarah Hoxie, [place]adeland Ripa, [place], Bufford Lope Pillow, Melissa Albertine Patricia [place].        Current Status/Progress Goal Weekly Team Focus  Medical   dysphagia passed MBS  maintain po hydration and calories, no asp PNA  initiate po   Bowel/Bladder   cont of B/B. LBM-12/12/12  remain cont of B/B.   remain continent.    Swallow/Nutrition/ Hydration   NPO; MBS completed 12/14/12 recommending Dys.2 textures and thin liquids with full staff supervision   least restrictive p.o. intake   carryover of safe swallow strategies   ADL's   Pt is currently min assist for toileting, bathing, and dressing tasks.  Demonstrates decreased coordination in the LLE with all transitional movements.   Goals were set at a modified  independent level.    Selfcare retraining, functional transfers, static and dynamic standing balance for selfcare tasks.   Mobility   mod  A overall secondary to ataxia, impulsiveness  Supervision transfers and ambulation, min A stairs, mod I w/c mobiltiy  Motor control, coordination, safety with mobility, balance, gait   Communication   min-mod assist  supervision   increase carryover of compensatory strategies   Safety/Cognition/ Behavioral Observations  min-mod assist  supervision-min assist  increase recall and carryover of new information    Pain   pt denies   remain free of pain  assess q shift medicate as needed.    Skin   dry skin, Right great toe amputated, baseball sized hard lump to LLE   remain free of infection/skin breakdown  remain free of infection skin breakdown      *See Care Plan and progress notes for long and short-term goals.  Barriers to Discharge: lives in boarding house, needs to be mod I    Possible Resolutions to Barriers:  Cont rehab efforts    Discharge Planning/Teaching Needs:  Pt hopes to be able to return to boarding house with only intermittent support.      Team Discussion:  Problems managing secretions-did pass MBS, but occ needs reminding about clearing throat/cough-pt refuses replacement of panda-on D2, thin per ST-needs meds changed to po.   Revisions to Treatment Plan:  Downgraded to S goals d/t ataxia, balance issues-currently Mod A/inconsistent-?assist available at d/c-may need SNF.   Continued Need for Acute Rehabilitation Level  of Care: The patient requires daily medical management by a physician with specialized training in physical medicine and rehabilitation for the following conditions: Daily direction of a multidisciplinary physical rehabilitation program to ensure safe treatment while eliciting the highest outcome that is of practical value to the patient.: Yes Daily medical management of patient stability for increased activity  during participation in an intensive rehabilitation regime.: Yes Daily analysis of laboratory values and/or radiology reports with any subsequent need for medication adjustment of medical intervention for : Neurological problems  Brock Ra 12/14/2012, 3:36 PM

## 2012-12-14 NOTE — Progress Notes (Signed)
Patient ID: [name], male   DOB: [birth year], 62 y.o.   MRN: [id number] Subjective/Complaints: Karie Soda out for MBS, says he will refuse reinsertion no matter what results of MBS Still with wet voice, difficulty clearing secretions  Review of Systems  Respiratory: Positive for cough. Negative for shortness of breath.        Fast breathing  Gastrointestinal: Positive for nausea. Negative for vomiting.     Objective: Vital Signs: Blood pressure 142/88, pulse 76, temperature 98.1 F (36.7 C), temperature source Oral, resp. rate 20, height 5\' 8"  (1.727 m), weight 74.6 kg (164 lb 7.4 oz), SpO2 96.00%. Dg Chest 2 View  12/13/2012  *RADIOLOGY REPORT*  Clinical Data:  Stroke 2 weeks ago, trouble swallowing, congestion, history hypertension, COPD  CHEST - 2 VIEW  Comparison: 12/09/2012  Findings: Upper normal heart size. Newly tortuous thoracic aorta. Mediastinal contours and pulmonary vascularity normal. Emphysematous changes with minimal linear subsegmental atelectasis versus scarring at left base. Lungs otherwise clear. No pleural effusion or pneumothorax. Feeding tube extends into stomach. Probable bilateral nipple shadows.  IMPRESSION: Changes of COPD with minimal linear subsegmental atelectasis versus scarring left base.   Original Report Authenticated By: Ulyses Southward, M.D.    Results for orders placed during the hospital encounter of 12/09/12 (from the past 72 hour(s))  GLUCOSE, CAPILLARY     Status: Abnormal   Collection Time    12/11/12 11:27 AM      Result Value Range   Glucose-Capillary 118 (*) 70 - 99 mg/dL  GLUCOSE, CAPILLARY     Status: Abnormal   Collection Time    12/11/12  4:12 PM      Result Value Range   Glucose-Capillary 121 (*) 70 - 99 mg/dL  GLUCOSE, CAPILLARY     Status: Abnormal   Collection Time    12/11/12  9:11 PM      Result Value Range   Glucose-Capillary 118 (*) 70 - 99 mg/dL   Comment 1 Notify RN    CBC WITH DIFFERENTIAL     Status: Abnormal   Collection Time     12/12/12  5:10 AM      Result Value Range   WBC 8.0  4.0 - 10.5 K/uL   RBC 4.44  4.22 - 5.81 MIL/uL   Hemoglobin 13.1  13.0 - 17.0 g/dL   HCT 40.9 (*) 81.1 - 91.4 %   MCV 86.7  78.0 - 100.0 fL   MCH 29.5  26.0 - 34.0 pg   MCHC 34.0  30.0 - 36.0 g/dL   RDW 78.2  95.6 - 21.3 %   Platelets 294  150 - 400 K/uL   Neutrophils Relative 54  43 - 77 %   Neutro Abs 4.3  1.7 - 7.7 K/uL   Lymphocytes Relative 30  12 - 46 %   Lymphs Abs 2.4  0.7 - 4.0 K/uL   Monocytes Relative 14 (*) 3 - 12 %   Monocytes Absolute 1.1 (*) 0.1 - 1.0 K/uL   Eosinophils Relative 2  0 - 5 %   Eosinophils Absolute 0.2  0.0 - 0.7 K/uL   Basophils Relative 0  0 - 1 %   Basophils Absolute 0.0  0.0 - 0.1 K/uL  COMPREHENSIVE METABOLIC PANEL     Status: Abnormal   Collection Time    12/12/12  5:10 AM      Result Value Range   Sodium 142  135 - 145 mEq/L   Potassium 4.1  3.5 - 5.1 mEq/L  Chloride 109  96 - 112 mEq/L   CO2 27  19 - 32 mEq/L   Glucose, Bld 125 (*) 70 - 99 mg/dL   BUN 21  6 - 23 mg/dL   Creatinine, Ser 4.09  0.50 - 1.35 mg/dL   Calcium 81.1  8.4 - 91.4 mg/dL   Total Protein 8.1  6.0 - 8.3 g/dL   Albumin 3.2 (*) 3.5 - 5.2 g/dL   AST 41 (*) 0 - 37 U/L   ALT 41  0 - 53 U/L   Alkaline Phosphatase 79  39 - 117 U/L   Total Bilirubin 0.2 (*) 0.3 - 1.2 mg/dL   GFR calc non Af Amer 87 (*) >90 mL/min   GFR calc Af Amer >90  >90 mL/min   Comment:            The eGFR has been calculated     using the CKD EPI equation.     This calculation has not been     validated in all clinical     situations.     eGFR's persistently     <90 mL/min signify     possible Chronic Kidney Disease.  GLUCOSE, CAPILLARY     Status: Abnormal   Collection Time    12/12/12  5:31 AM      Result Value Range   Glucose-Capillary 110 (*) 70 - 99 mg/dL   Comment 1 Notify RN    GLUCOSE, CAPILLARY     Status: Abnormal   Collection Time    12/12/12  7:27 AM      Result Value Range   Glucose-Capillary 110 (*) 70 - 99 mg/dL   GLUCOSE, CAPILLARY     Status: Abnormal   Collection Time    12/12/12 11:33 AM      Result Value Range   Glucose-Capillary 107 (*) 70 - 99 mg/dL  GLUCOSE, CAPILLARY     Status: None   Collection Time    12/12/12  5:09 PM      Result Value Range   Glucose-Capillary 87  70 - 99 mg/dL  GLUCOSE, CAPILLARY     Status: Abnormal   Collection Time    12/12/12  9:30 PM      Result Value Range   Glucose-Capillary 118 (*) 70 - 99 mg/dL  GLUCOSE, CAPILLARY     Status: Abnormal   Collection Time    12/13/12  6:12 AM      Result Value Range   Glucose-Capillary 127 (*) 70 - 99 mg/dL  GLUCOSE, CAPILLARY     Status: None   Collection Time    12/13/12 11:50 AM      Result Value Range   Glucose-Capillary 97  70 - 99 mg/dL   Comment 1 Notify RN    GLUCOSE, CAPILLARY     Status: Abnormal   Collection Time    12/13/12  4:26 PM      Result Value Range   Glucose-Capillary 124 (*) 70 - 99 mg/dL   Comment 1 Notify RN    GLUCOSE, CAPILLARY     Status: Abnormal   Collection Time    12/13/12  8:47 PM      Result Value Range   Glucose-Capillary 108 (*) 70 - 99 mg/dL   Comment 1 Notify RN    GLUCOSE, CAPILLARY     Status: Abnormal   Collection Time    12/14/12  6:09 AM      Result Value Range   Glucose-Capillary 104 (*)  70 - 99 mg/dL   Comment 1 Notify RN       HEENT: oromotor weakness Cardio: RRR and no murmur Resp: Rales and upper airway sounds, unlabored, using Yankauer suction GI: BS positive and non tender Extremity:  Pulses positive and No Edema Skin:   Intact Neuro: Alert/Oriented, Cranial Nerve Abnormalities CN 9 and 10, , Abnormal Motor 4/5 in BUE and BLE, Abnormal FMC Ataxic/ dec FMC and Dysarthric Musc/Skel:  Normal Gen NAD, hiccups   Assessment/Plan: 1. 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 Physiatrist is providing close team supervision and 24 hour  management of active medical problems listed below. Physiatrist and rehab team continue to assess barriers to discharge/monitor patient progress toward functional and medical goals. FIM: FIM - Bathing Bathing Steps Patient Completed: Right Arm;Left Arm;Chest;Abdomen;Front perineal area;Buttocks;Right upper leg;Left upper leg;Right lower leg (including foot);Left lower leg (including foot) Bathing: 4: Steadying assist  FIM - Upper Body Dressing/Undressing Upper body dressing/undressing steps patient completed: Thread/unthread right sleeve of pullover shirt/dresss;Thread/unthread left sleeve of pullover shirt/dress;Put head through opening of pull over shirt/dress;Pull shirt over trunk Upper body dressing/undressing: 5: Set-up assist to: Obtain clothing/put away FIM - Lower Body Dressing/Undressing Lower body dressing/undressing steps patient completed: Thread/unthread right pants leg;Thread/unthread left pants leg;Pull pants up/down;Don/Doff right sock;Don/Doff left sock;Don/Doff right shoe;Don/Doff left shoe;Fasten/unfasten right shoe;Fasten/unfasten left shoe Lower body dressing/undressing: 5: Set-up assist to: Obtain clothing  FIM - Toileting Toileting steps completed by patient: Performs perineal hygiene Toileting Assistive Devices: Grab bar or rail for support Toileting: 4: Steadying assist  FIM - Diplomatic Services operational officer Devices: Grab bars Toilet Transfers: 4-To toilet/BSC: Min A (steadying Pt. > 75%);4-From toilet/BSC: Min A (steadying Pt. > 75%)  FIM - Bed/Chair Transfer Bed/Chair Transfer Assistive Devices: Arm rests;HOB elevated Bed/Chair Transfer: 3: Chair or W/C > Bed: Mod A (lift or lower assist);3: Bed > Chair or W/C: Mod A (lift or lower assist)  FIM - Locomotion: Wheelchair Distance: 150 Locomotion: Wheelchair: 5: Travels 150 ft or more: maneuvers on rugs and over door sills with supervision, cueing or coaxing FIM - Locomotion: Ambulation Locomotion:  Ambulation Assistive Devices: Other (comment) (bilat walking poles) Ambulation/Gait Assistance: 2: Max assist Locomotion: Ambulation: 1: Travels less than 50 ft with maximal assistance (Pt: 25 - 49%)  Comprehension Comprehension Mode: Auditory Comprehension: 5-Understands complex 90% of the time/Cues < 10% of the time  Expression Expression Mode: Verbal Expression: 4-Expresses basic 75 - 89% of the time/requires cueing 10 - 24% of the time. Needs helper to occlude trach/needs to repeat words.  Social Interaction Social Interaction: 5-Interacts appropriately 90% of the time - Needs monitoring or encouragement for participation or interaction.  Problem Solving Problem Solving: 5-Solves basic 90% of the time/requires cueing < 10% of the time  Memory Memory: 5-Recognizes or recalls 90% of the time/requires cueing < 10% of the time  Medical Problem List and Plan:  1. DVT Prophylaxis/Anticoagulation: Pharmaceutical: Lovenox  2. Pain Management: N/A  3. Mood: Will have LCSW follow for evaluation.  4. Neuropsych: This patient is not capable of making decisions on his/her own behalf.  5. HTN: Monitor with bid checks. Continue HCTZ and Prinivil for now. May need to re-evaluate diuretic in face of modified diet and acute renal insufficiency.  6. COPD: Continue spiriva. Prn nebs for SOB. Keep HOB elevated at 30 degrees. Encourage IS.  7. Dysphagia: Continue NPO due to severe dysphagia. TF for  nutritional support. Check MBS 8.  Hiccups, baclofen titrate upwards LOS (Days) 5 A FACE TO FACE EVALUATION WAS PERFORMED  Janeka Libman,[place]orgon E 12/14/2012, 8:44 AM

## 2012-12-14 NOTE — Progress Notes (Signed)
D- Patient was started on a DYS 2  Thin liquid diet with lunch today. Patient unable to manage consistency with copious amounts of phlegm, thick white in nature. Patient max verbal cues to follow swallowing strategies. Tray removed to slow patient down until could clear secretions, then tried again with applesauce,did not eat that, drank 120 cc Ensure, spoke with SLP, Toni Amend. Discussed with Marissa Nestle, PA of above findings A- Diet downgraded to DYS 1 Thin, tolerated better, still requiring max verbal cueing and reminders for small bites, 2 swallows, hard cough, 2 swallows with each bite, Continues with copious amounts of secretions, self suctions with [place] after spitting out secretions into basin, noted has color tint of food or liquid at times, repeated need to go slow and had to take tray away to allow adequate time for him to manage secretions. R- Patient able to state why precautions needed and what could happen if he does not. Patient stated " Food into lungs and cause pneumonia."" Not going to have that tube back no matter what" . Patient also started on Robitussin 400 mg po with meals and out bedtime.SLP to monitor breakfast with him in am. [name], [name] Elon Jester

## 2012-12-14 NOTE — Progress Notes (Signed)
Physical Therapy Session Note  Patient Details  Name: [name] MRN: [id number] Date of Birth: [birth year]  Today's Date: 12/14/2012 Time: 1135-1205 and 1430-1500 Time Calculation (min): 30 min and 30 min  Short Term Goals: Week 1:  PT Short Term Goal 1 (Week 1): Pt will perform functional transfers with min A PT Short Term Goal 2 (Week 1): Pt will gait in controlled environment min A 150'  Therapy Documentation Precautions:  Precautions Precautions: Fall Precaution Comments: NPO NG tube Restrictions Weight Bearing Restrictions: No Pain: Pain Assessment Pain Assessment: No/denies pain Mobility:  Patient with NG tube out; returned from [facility].  Performed supine > sit from bed with supervision; donned shoes EOB supervision and performed squat pivot bed > w/c with supervision-min A Locomotion : Wheelchair Mobility Distance: 300 in controlled environment with supervision Other Treatments: Treatments Neuromuscular Facilitation: Right;Lower Extremity;Forced use;Activity to increase coordination;Activity to increase motor control;Activity to increase timing and sequencing;Activity to increase grading;Activity to increase sustained activation;Activity to increase lateral weight shifting;Activity to increase anterior-posterior weight shifting in standing w support on RW during alternating ball kicks to focus on lateral weight shifting, single limb stance and coordination/motor control with mod-max A secondary to RLE buckling when BOS too wide or too narrow.  Performed stair negotiation up and down 3 short steps x 3 reps with bilat rails ascending with RLE and descending with LLE to focus on sustained activation, concentric and eccentric motor control, and anterior translation of COG on RLE with min A overall.  Performed single limb sit > stand from tall mat with use of RLE only for R lateral weight shifting and activation of RLE extensors in stance.    PM session: performed transfer w/c <> mat  and w/c > bed squat pivot with UE support and supervision overall.  Sit <> supine on flat mat and bed with supervision.  On mat performed bilat LE Frenkel's exercises in supine for coordination, motor control, timing/sequencing and grading of movement x 8 reps each exercises.  Patient able to perform unilateral exercises without difficulty but had increased difficulty with alternating reciprocal LE exercises.  Returned to room with w/c mobility and supervision.  Patient re-educated on swallowing strategies while drinking thin liquids and cued for coughing and clearing secretions during session.    See FIM for current functional status  Therapy/Group: Individual Therapy  [name] [facility] 12/14/2012, 12:25 PM

## 2012-12-15 ENCOUNTER — Inpatient Hospital Stay (HOSPITAL_COMMUNITY): Payer: Medicaid Other | Admitting: Occupational Therapy

## 2012-12-15 ENCOUNTER — Inpatient Hospital Stay (HOSPITAL_COMMUNITY): Payer: Medicaid Other | Admitting: *Deleted

## 2012-12-15 ENCOUNTER — Inpatient Hospital Stay (HOSPITAL_COMMUNITY): Payer: Medicaid Other | Admitting: Speech Pathology

## 2012-12-15 LAB — GLUCOSE, CAPILLARY
Glucose-Capillary: 100 mg/dL — ABNORMAL HIGH (ref 70–99)
Glucose-Capillary: 99 mg/dL (ref 70–99)

## 2012-12-15 MED ORDER — SODIUM CHLORIDE 0.9 % IV SOLN
INTRAVENOUS | Status: DC
  Administered 2012-12-15 – 2012-12-21 (×8): via INTRAVENOUS

## 2012-12-15 NOTE — Progress Notes (Signed)
Occupational Therapy Session Note  Patient Details  Name: [name] MRN: [id number] Date of Birth: 1950-11-01  Today's Date: 12/15/2012 Time: 0730-0827 Time Calculation (min): 57 min  Short Term Goals: Week 1:  OT Short Term Goal 1 (Week 1): Pt. will bath self using sit to stand with supervision OT Short Term Goal 2 (Week 1): Pt. will groom self with standing at sup for 5 minutes OT Short Term Goal 3 (Week 1): Pt. will dress self at supervision level OT Short Term Goal 4 (Week 1): Pt. will engage in toilet transfer at supervision level OT Short Term Goal 5 (Week 1): Pt. will transfer to shower stall at minimal assist level  Skilled Therapeutic Interventions/Progress Updates:    Pt seen for ADL retraining with focus on functional mobility, safety with RW and transfers, and static and dynamic standing balance.  Pt completed bathing at sit to stand level in room shower with use of grab bars and shower chair for safety.  Pt required min-mod assist with ambulation with RW, with increased assist when turning and walking backwards.  Verbal cues provided to slow down to increase safety with mobility.  Pt completed bathing with close supervision - min/steady assist when standing to wash buttocks.  Educated pt on completing LB dressing at sit to stand level to increase safety as decreased dynamic balance and balance reactions.  Engaged in therapeutic activity with increased challenge of dynamic standing balance with 2" wedge under Lt foot to increase RLE weight bearing and weight shift during table top activity, progressing to trunk rotation activity in standing without UE support.  Pt required min/steady assist with standing activities this session.  Therapy Documentation Precautions:  Precautions Precautions: Fall Precaution Comments: NPO NG tube Restrictions Weight Bearing Restrictions: No General:   Vital Signs: Therapy Vitals Temp: 97.8 F (36.6 C) Temp src: Oral Pulse Rate: 73 Resp:  18 BP: 142/88 mmHg Patient Position, if appropriate: Lying Oxygen Therapy SpO2: 97 % Pain:  Pt with no c/o pain this session.  See FIM for current functional status  Therapy/Group: Individual Therapy  Leonette Monarch 12/15/2012, 8:31 AM

## 2012-12-15 NOTE — Progress Notes (Addendum)
Physical Therapy Session Note  Patient Details  Name: [name] MRN: [id number] Date of Birth: [birth year]  Today's Date: 12/15/2012 Time: 9:47-10:45 ( )   Short Term Goals: Week 1:  PT Short Term Goal 1 (Week 1): Pt will perform functional transfers with min A PT Short Term Goal 2 (Week 1): Pt will gait in controlled environment min A 150'  Skilled Therapeutic Interventions/Progress Updates:  Tx focused on gait training and NMR for coordination and balance.  Pt propelled WC2x150' with S in controlled environment.  Sit<>stands with Min A x10 throughout tx with cues for controlled descent and for safety to wait until therapist ready.   Gait training 2x150' with RW and up to Mod A for steadying due to R trunk lean and erratic R LE placement periodically. Pt continues to have difficulty maintaining straight path and R foot placement drifts medially.  Gait training on carpet and controlled environment with RW and with HHA only x75' with mod A and greater difficutly with LE placement, crossing midline at times.   NMR including seated toe taps to stool and then cone for increased challenge x20 each, then in standing with bil UE>> 1UE>>no UE x20 each to stool then cone for graded challenge. Pt continues to demonstrate LOB to R with difficulty sustaining midline correction. Pt given cues to focus on controlled movements.  Pt performed static standing balance task with no UE support reaching overhead and outside BOS in all directions x102min with Min A for steadying. R knee remains flexed until cued, and then bends again while concentrating on other task.   Ascended/descended 12 stairs with 1-2 rails and up to Mod A due to decreased control of descent. Pt has difficulty following directions for step-to pattern after unsafe reciprocal attempt noted. Pt also needs cues to bring arms along rails, tending to leave them behind him as he descends. Decreased safety awareness noted.   Pt needed sitting  rest breaks x5 throughout due to muscular fatigue.      Therapy Documentation Precautions:  Precautions Precautions: Fall Precaution Comments: NPO NG tube Restrictions Weight Bearing Restrictions: No General:   Vital Signs: Therapy Vitals Temp: 97.8 F (36.6 C) Temp src: Oral Pulse Rate: 73 Resp: 18 BP: 142/88 mmHg Patient Position, if appropriate: Lying Oxygen Therapy SpO2: 97 % Pain: none      Locomotion : Ambulation Ambulation/Gait Assistance: 3: Mod assist Wheelchair Mobility Distance: 150   See FIM for current functional status  Therapy/Group: Individual Therapy  Clydene Laming, PT, DPT   12/15/2012, 10:17 AM

## 2012-12-15 NOTE — Progress Notes (Signed)
Occupational Therapy Session Note  Patient Details  Name: DAMARIE SCHOOLFIELD MRN: [id number] Date of Birth: 08-13-1950  Today's Date: 12/15/2012 Time: 1130-1145 Time Calculation (min): 15 min  Skilled Therapeutic Interventions/Progress Updates:    Pt seen for group therapy session today in [employer] with focus on swallowing strategies & safety. Pt required Maximal assistance throughout session to cough and clear throat, often stating "It's clear", when it was not. Pt ate very little food due to difficulty with swallowing.  Therapy Documentation Precautions:  Precautions Precautions: Fall Precaution Comments: NPO NG tube Restrictions Weight Bearing Restrictions: No   Pain: Pain Assessment Pain Assessment: No/denies pain   See FIM for current functional status  Therapy/Group: Group Therapy  [place], Amy Beth Dixon 12/15/2012, 1:32 PM

## 2012-12-15 NOTE — Progress Notes (Signed)
Patient ID: [name], male   DOB: September 08, 1950, 62 y.o.   MRN: [id number] Subjective/Complaints: Occ SOB in bed, appears comfortable wheeling WC  Still with wet voice, difficulty clearing secretions  Review of Systems  Respiratory: Positive for cough. Negative for shortness of breath.        Fast breathing  Gastrointestinal: Positive for nausea. Negative for vomiting.     Objective: Vital Signs: Blood pressure 142/88, pulse 73, temperature 97.8 F (36.6 C), temperature source Oral, resp. rate 18, height 5\' 8"  (1.727 m), weight 76.114 kg (167 lb 12.8 oz), SpO2 97.00%.  Results for orders placed during the hospital encounter of 12/09/12 (from the past 72 hour(s))  GLUCOSE, CAPILLARY     Status: Abnormal   Collection Time    12/12/12 11:33 AM      Result Value Range   Glucose-Capillary 107 (*) 70 - 99 mg/dL  GLUCOSE, CAPILLARY     Status: None   Collection Time    12/12/12  5:09 PM      Result Value Range   Glucose-Capillary 87  70 - 99 mg/dL  GLUCOSE, CAPILLARY     Status: Abnormal   Collection Time    12/12/12  9:30 PM      Result Value Range   Glucose-Capillary 118 (*) 70 - 99 mg/dL  GLUCOSE, CAPILLARY     Status: Abnormal   Collection Time    12/13/12  6:12 AM      Result Value Range   Glucose-Capillary 127 (*) 70 - 99 mg/dL  GLUCOSE, CAPILLARY     Status: None   Collection Time    12/13/12 11:50 AM      Result Value Range   Glucose-Capillary 97  70 - 99 mg/dL   Comment 1 Notify RN    GLUCOSE, CAPILLARY     Status: Abnormal   Collection Time    12/13/12  4:26 PM      Result Value Range   Glucose-Capillary 124 (*) 70 - 99 mg/dL   Comment 1 Notify RN    GLUCOSE, CAPILLARY     Status: Abnormal   Collection Time    12/13/12  8:47 PM      Result Value Range   Glucose-Capillary 108 (*) 70 - 99 mg/dL   Comment 1 Notify RN    GLUCOSE, CAPILLARY     Status: Abnormal   Collection Time    12/14/12  6:09 AM      Result Value Range   Glucose-Capillary 104 (*) 70 -  99 mg/dL   Comment 1 Notify RN    GLUCOSE, CAPILLARY     Status: Abnormal   Collection Time    12/14/12 11:17 AM      Result Value Range   Glucose-Capillary 106 (*) 70 - 99 mg/dL   Comment 1 Notify RN    GLUCOSE, CAPILLARY     Status: Abnormal   Collection Time    12/14/12  4:30 PM      Result Value Range   Glucose-Capillary 102 (*) 70 - 99 mg/dL   Comment 1 Notify RN    GLUCOSE, CAPILLARY     Status: Abnormal   Collection Time    12/14/12  8:44 PM      Result Value Range   Glucose-Capillary 117 (*) 70 - 99 mg/dL   Comment 1 Notify RN    GLUCOSE, CAPILLARY     Status: Abnormal   Collection Time    12/15/12  6:06 AM  Result Value Range   Glucose-Capillary 107 (*) 70 - 99 mg/dL   Comment 1 Notify RN       HEENT: oromotor weakness Cardio: RRR and no murmur Resp: Rales and upper airway sounds, unlabored, breath sounds improve with cough GI: BS positive and non tender Extremity:  Pulses positive and No Edema Skin:   Intact Neuro: Alert/Oriented, Cranial Nerve Abnormalities CN 9 and 10, , Abnormal Motor 4/5 in BUE and BLE, Abnormal FMC Ataxic/ dec FMC and Dysarthric Musc/Skel:  Normal Gen NAD, hiccups   Assessment/Plan: 1. 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 Physiatrist is providing close team supervision and 24 hour management of active medical problems listed below. Physiatrist and rehab team continue to assess barriers to discharge/monitor patient progress toward functional and medical goals. FIM: FIM - Bathing Bathing Steps Patient Completed: Chest;Right Arm;Left Arm;Abdomen Bathing: 5: Supervision: Safety issues/verbal cues  FIM - Upper Body Dressing/Undressing Upper body dressing/undressing steps patient completed: Thread/unthread right sleeve of pullover shirt/dresss;Thread/unthread left sleeve of pullover shirt/dress;Put head through opening  of pull over shirt/dress;Pull shirt over trunk Upper body dressing/undressing: 5: Supervision: Safety issues/verbal cues FIM - Lower Body Dressing/Undressing Lower body dressing/undressing steps patient completed: Don/Doff right shoe;Don/Doff left shoe;Fasten/unfasten right shoe;Fasten/unfasten left shoe Lower body dressing/undressing: 5: Supervision: Safety issues/verbal cues  FIM - Toileting Toileting steps completed by patient: Performs perineal hygiene Toileting Assistive Devices: Grab bar or rail for support Toileting: 4: Steadying assist  FIM - Diplomatic Services operational officer Devices: Grab bars Toilet Transfers: 4-To toilet/BSC: Min A (steadying Pt. > 75%);4-From toilet/BSC: Min A (steadying Pt. > 75%)  FIM - Bed/Chair Transfer Bed/Chair Transfer Assistive Devices: Arm rests Bed/Chair Transfer: 5: Supine > Sit: Supervision (verbal cues/safety issues);5: Bed > Chair or W/C: Supervision (verbal cues/safety issues);4: Chair or W/C > Bed: Min A (steadying Pt. > 75%)  FIM - Locomotion: Wheelchair Distance: 300 Locomotion: Wheelchair: 5: Travels 150 ft or more: maneuvers on rugs and over door sills with supervision, cueing or coaxing FIM - Locomotion: Ambulation Locomotion: Ambulation Assistive Devices: Other (comment) (bilat walking poles) Ambulation/Gait Assistance: 2: Max assist Locomotion: Ambulation: 1: Travels less than 50 ft with maximal assistance (Pt: 25 - 49%)  Comprehension Comprehension Mode: Auditory Comprehension: 5-Understands complex 90% of the time/Cues < 10% of the time  Expression Expression Mode: Verbal Expression: 4-Expresses basic 75 - 89% of the time/requires cueing 10 - 24% of the time. Needs helper to occlude trach/needs to repeat words.  Social Interaction Social Interaction: 5-Interacts appropriately 90% of the time - Needs monitoring or encouragement for participation or interaction.  Problem Solving Problem Solving: 5-Solves basic 90% of  the time/requires cueing < 10% of the time  Memory Memory: 5-Recognizes or recalls 90% of the time/requires cueing < 10% of the time  Medical Problem List and Plan:  1. DVT Prophylaxis/Anticoagulation: Pharmaceutical: Lovenox  2. Pain Management: N/A  3. Mood: Will have LCSW follow for evaluation.  4. Neuropsych: This patient is not capable of making decisions on his/her own behalf.  5. HTN: Monitor with bid checks. Continue HCTZ and Prinivil for now. May need to re-evaluate diuretic in face of modified diet and acute renal insufficiency.  6. COPD: Continue spiriva. Prn nebs for SOB. Keep HOB elevated at 30 degrees. Encourage IS.  7. Dysphagia:. ReCheck MBS showed safe for thin liquid D2 diet, monitoring for signs of aspiration PNA 8.  Hiccups, improved on baclofen  LOS (Days) 6 A FACE TO FACE EVALUATION WAS PERFORMED  Shirin Echeverry,Micheal E 12/15/2012, 8:05 AM

## 2012-12-15 NOTE — Progress Notes (Signed)
Social Work Patient ID: [name], male   DOB: 01/23/1951, 62 y.o.   MRN: [id number]  Met yesterday afternoon with pt to review team conference.  Informed pt that team targeting 5/16 for d/c, however, feel he must have 24/7 min assist at home.  Pt response difficult to understanding but he did nod "yes".  Plan to contact pt's' cousin to discuss pt's assistance needs.  Will keep team posted.  Carmelle Bamberg, LCSW

## 2012-12-15 NOTE — Progress Notes (Signed)
Speech Language Pathology Daily Session Note  Patient Details  Name: [name] MRN: [id number] Date of Birth: 1950-11-29  Today's Date: 12/15/2012 Time: 4098-1191 Time Calculation (min): 20 min  Short Term Goals: Week 1: SLP Short Term Goal 1 (Week 1): Pt will self-monitor and correct wet vocal quality with Mod verbal and question cues.  SLP Short Term Goal 2 (Week 1): Pt will perform pharyngeal strengthening exercises with Min verbal cues.  SLP Short Term Goal 3 (Week 1): Pt will consume trials of ice chips with minimal overt s/s of aspiration with 50% of trials with Mod A verbal cues.  SLP Short Term Goal 4 (Week 1): Pt will utilize speech intelligibility strategies with Mod A verbal and question cues for 75% intelligibility at the conversation level SLP Short Term Goal 5 (Week 1): Pt will demonstrate emergent awareness of swallowing impairments with Mod A question and semantic cues. SLP Short Term Goal 6 (Week 1): Patient will consume Dys.2 textures and thin liquids with mod assist clinician cues to follow safe swallow strategies.  Skilled Therapeutic Interventions: Group, co-treatment session with OT to focus on safety with swallowing and self-feeding. SLP facilitated session with Dys.1 textures and thin liquids via cup. Patient required mod-max assist verbal cues to recall and utilize recommended safe swallow strategies.  Patient demonstrated intermittent episodes of wet vocal quality between sips and bites and even at rest at times; however, he was able to orally expectorate these secretions.  Continue with current plan of care.    FIM:  Comprehension Comprehension Mode: Auditory Comprehension: 5-Understands basic 90% of the time/requires cueing < 10% of the time Expression Expression Mode: Verbal Expression: 4-Expresses basic 75 - 89% of the time/requires cueing 10 - 24% of the time. Needs helper to occlude trach/needs to repeat words. Social Interaction Social Interaction:  5-Interacts appropriately 90% of the time - Needs monitoring or encouragement for participation or interaction. Problem Solving Problem Solving: 5-Solves basic 90% of the time/requires cueing < 10% of the time Memory Memory: 3-Recognizes or recalls 50 - 74% of the time/requires cueing 25 - 49% of the time FIM - Eating Eating Activity: 5: Needs verbal cues/supervision  Pain Pain Assessment Pain Assessment: No/denies pain  Therapy/Group: Group Therapy  Charlane Ferretti., CCC-SLP [phone]  Umeka Wrench 12/15/2012, 4:18 PM

## 2012-12-15 NOTE — Progress Notes (Signed)
Speech Language Pathology Daily Session Note  Patient Details  Name: [name] MRN: [id number] Date of Birth: 26-May-1951  Today's Date: 12/15/2012 Time: 4098-1191 Time Calculation (min): 45 min  Short Term Goals: Week 1: SLP Short Term Goal 1 (Week 1): Pt will self-monitor and correct wet vocal quality with Mod verbal and question cues.  SLP Short Term Goal 2 (Week 1): Pt will perform pharyngeal strengthening exercises with Min verbal cues.  SLP Short Term Goal 3 (Week 1): Pt will consume trials of ice chips with minimal overt s/s of aspiration with 50% of trials with Mod A verbal cues.  SLP Short Term Goal 4 (Week 1): Pt will utilize speech intelligibility strategies with Mod A verbal and question cues for 75% intelligibility at the conversation level SLP Short Term Goal 5 (Week 1): Pt will demonstrate emergent awareness of swallowing impairments with Mod A question and semantic cues. SLP Short Term Goal 6 (Week 1): Patient will consume Dys.2 textures and thin liquids with mod assist clinician cues to follow safe swallow strategies.  Skilled Therapeutic Interventions: Skilled treatment session focused on addressing dysphagia goals.  SLP facilitated session with Dys.1 textures and thin liquids as well as max faded to mod assist verbal cues to consume small single bites and sips as well as recall and utilize 2 swallows followed by a cough and then 2 more swallows.  SLP implemented an external aid to assist with carryover; however, patient continued to require cues to use perform strategies.  Patient also required max assist clinician cues to take rest breaks.  During these periods patient would demonstrate clear vocal quality after bite and then become wet sounding again.  He would orally expectorate these secretions which would contain traces of p.o.   SLP questions if esophageal back flow or reflux could be impacting function.  Patient verbalized frustration with how long it took to eat so  little food and verbalized concern regarding discharging like this, "I won't last a week at home."  SLP initiated discussion regarding follow therapy at next level of care (SNF) and recommended patient discuss options for alternative means with MD/PA.  Patient stated "I don't want that tube down my nose again."  SLP requested patient be open minded to his options.  MD/PA please follow up with patient regarding his concerns.     FIM:  Comprehension Comprehension Mode: Auditory Comprehension: 5-Understands basic 90% of the time/requires cueing < 10% of the time Expression Expression Mode: Verbal Expression: 4-Expresses basic 75 - 89% of the time/requires cueing 10 - 24% of the time. Needs helper to occlude trach/needs to repeat words. Social Interaction Social Interaction: 5-Interacts appropriately 90% of the time - Needs monitoring or encouragement for participation or interaction. Problem Solving Problem Solving: 5-Solves basic 90% of the time/requires cueing < 10% of the time Memory Memory: 3-Recognizes or recalls 50 - 74% of the time/requires cueing 25 - 49% of the time FIM - Eating Eating Activity: 5: Needs verbal cues/supervision  Pain Pain Assessment Pain Assessment: No/denies pain  Therapy/Group: Individual Therapy  Charlane Ferretti., CCC-SLP [phone]  [name] 12/15/2012, 11:33 AM

## 2012-12-16 ENCOUNTER — Inpatient Hospital Stay (HOSPITAL_COMMUNITY): Payer: Medicaid Other | Admitting: Speech Pathology

## 2012-12-16 ENCOUNTER — Inpatient Hospital Stay (HOSPITAL_COMMUNITY): Payer: Medicaid Other | Admitting: Occupational Therapy

## 2012-12-16 ENCOUNTER — Inpatient Hospital Stay (HOSPITAL_COMMUNITY): Payer: Medicaid Other | Admitting: Physical Therapy

## 2012-12-16 LAB — BASIC METABOLIC PANEL
Calcium: 9.7 mg/dL (ref 8.4–10.5)
Creatinine, Ser: 1.18 mg/dL (ref 0.50–1.35)
GFR calc Af Amer: 74 mL/min — ABNORMAL LOW (ref >90)
Sodium: 139 mEq/L (ref 135–145)

## 2012-12-16 LAB — GLUCOSE, CAPILLARY: Glucose-Capillary: 101 mg/dL — ABNORMAL HIGH (ref 70–99)

## 2012-12-16 MED ORDER — CLOPIDOGREL BISULFATE 75 MG PO TABS
75.0000 mg | ORAL_TABLET | Freq: Every day | ORAL | Status: DC
  Filled 2012-12-16 (×2): qty 1

## 2012-12-16 MED ORDER — CLOPIDOGREL BISULFATE 75 MG PO TABS
75.0000 mg | ORAL_TABLET | Freq: Every day | ORAL | Status: DC
  Administered 2012-12-22 – 2013-01-02 (×12): 75 mg via ORAL
  Filled 2012-12-16 (×14): qty 1

## 2012-12-16 MED ORDER — ENSURE PUDDING PO PUDG
1.0000 | Freq: Four times a day (QID) | ORAL | Status: DC
  Administered 2012-12-16 – 2012-12-22 (×20): 1 via ORAL

## 2012-12-16 MED ORDER — SORBITOL 70 % SOLN
45.0000 mL | Freq: Every day | Status: DC | PRN
  Administered 2012-12-16 – 2013-01-01 (×5): 45 mL via ORAL
  Filled 2012-12-16: qty 30
  Filled 2012-12-16 (×2): qty 60
  Filled 2012-12-16 (×2): qty 30
  Filled 2012-12-16: qty 60

## 2012-12-16 NOTE — Progress Notes (Signed)
NUTRITION FOLLOW UP  Intervention:    Ensure Pudding po QID with medications, each supplement provides 170 kcal and 4 grams of protein.   Nutrition Dx:   Inadequate oral intake related to dysphagia as evidenced by poor intake of meals. Ongoing.  Goal:   Intake to meet >90% of estimated nutrition needs. Unmet.  Monitor:   PO intake, ability to resume TF when PEG is placed, labs, weight trend.  Assessment:   Patient's NGT was removed for a swallow evaluation 5/7. Diet advanced to Dysphagia 2-thin, then downgraded to Dysphagia 1-thin 5/7. Patient refusing to have NGT re-inserted. RN reports that patient is not tolerating PO diet well with increased secretions. Patient is receiving NS @ 75 ml/h x 12 hours nocturnally to ensure adequate fluid intake. PO intake is inadequate, therefore, plans are for PEG next week once patient is off Plavix for 5 days. Patient is taking medications PO. Will order Ensure pudding to give with medications QID per MOST trial protocol.  Height: Ht Readings from Last 1 Encounters:  12/09/12 5\' 8"  (1.727 m)    Weight Status:  stable Wt Readings from Last 1 Encounters:  12/14/12 167 lb 12.8 oz (76.114 kg)  12/09/12  164 lb 14.5 oz (74.8 kg)   Re-estimated needs:  Kcal: 1850-2050  Protein: 85-100  Fluid: >1.9 L/day  Skin: no problems noted  Diet Order: Dysphagia 1 with thin liquids   Intake/Output Summary (Last 24 hours) at 12/16/12 1215 Last data filed at 12/16/12 1011  Gross per 24 hour  Intake   1500 ml  Output   1200 ml  Net    300 ml    Last BM: 5/5   Labs:   Recent Labs Lab 12/09/12 2127 12/12/12 0510 12/16/12 0640  NA  --  142 139  K  --  4.1 4.2  CL  --  109 102  CO2  --  27 31  BUN  --  21 23  CREATININE 1.16 0.95 1.18  CALCIUM  --  10.0 9.7  GLUCOSE  --  125* 93    CBG (last 3)   Recent Labs  12/15/12 2058 12/16/12 0718 12/16/12 1107  GLUCAP 117* 94 101*    Scheduled Meds: . sodium chloride   Intravenous  Custom  . albuterol  2 puff Inhalation BID  . antiseptic oral rinse  15 mL Mouth Rinse q12n4p  . atorvastatin  80 mg Oral q1800  . baclofen  20 mg Oral TID  . chlorhexidine  15 mL Mouth Rinse BID  . [START ON 12/22/2012] clopidogrel  75 mg Oral Q breakfast  . enoxaparin (LOVENOX) injection  40 mg Subcutaneous Q24H  . guaiFENesin  20 mL Oral TID AC & HS  . hydrochlorothiazide  25 mg Oral Daily  . lisinopril  10 mg Oral Daily  . pantoprazole sodium  40 mg Per Tube Daily  . potassium chloride  20 mEq Oral Daily  . tiotropium  18 mcg Inhalation Daily    Continuous Infusions: None  Joaquin Courts, RD, LDN, CNSC Pager [phone] After Hours Pager [phone]

## 2012-12-16 NOTE — Progress Notes (Signed)
Physical Therapy Weekly Progress Note  Patient Details  Name: [name] MRN: [id number] Date of Birth: 07/08/1951  Today's Date: 12/16/2012 Time: 1104-1201 Time Calculation (min): 57 min   Patient has made steady progress and has met 1 of 2 short term goals.  Patient is currently supervision for bed mobility and w/c mobility in controlled environment but continues to require min-mod A for basic transfers bed <> w/c and mod-max A for gait and stair negotiation secondary to intermittent R lateral lean, pushing and LOB and intermittent RLE buckling during standing.  Secondary to slow progress patient's goals have been downgraded to supervision-min A overall.  Patient is also to receive PEG tube for nutrition supplementation prior to D/C.  It is unclear if there will be anyone at boarding house that can provide 24/7 supervision, assistance for mobility and PEG management.    Patient continues to demonstrate the following deficits: R sided weakness, impaired motor control, timing, sequencing, coordination, ataxia, impaired postural control, balance, gait and therefore will continue to benefit from skilled PT intervention to enhance overall performance with balance, postural control, ability to compensate for deficits, functional use of  right upper extremity and right lower extremity and coordination.  Patient not progressing toward long term goals.  See goal revision..  Plan of care revisions: goals downgraded to supervision-min A overall.  PT Short Term Goals Week 1:  PT Short Term Goal 1 (Week 1): Pt will perform functional transfers with min A PT Short Term Goal 1 - Progress (Week 1): Met PT Short Term Goal 2 (Week 1): Pt will gait in controlled environment min A 150' PT Short Term Goal 2 - Progress (Week 1): Progressing toward goal Week 2:  PT Short Term Goal 1 (Week 2): = LTG of supervision-min A overall  Therapy Documentation Precautions:  Precautions Precautions: Fall Precaution  Comments: dys 1 diet, thin liquids.  2 swallows, hard cough, followed by 2 swallows with each bite/sip Restrictions Weight Bearing Restrictions: No Vital Signs: Therapy Vitals Temp: 98.2 F (36.8 C) Temp src: Oral Pulse Rate: 81 Resp: 17 BP: 145/88 mmHg Patient Position, if appropriate: Lying Oxygen Therapy SpO2: 97 % O2 Device: None (Room air) Pain: Pain Assessment Pain Assessment: No/denies pain Pain Score: 0-No pain Patients Stated Pain Goal: 0 Multiple Pain Sites: No Locomotion :  Performed gait with RW x 100' in controlled environment; patient requiring max A today for gait secondary to significant R lateral lean/pushing to R, decreased R step length and unsafe management of RW.  Patient also experiencing increased buckling of RLE and R lateral LOB.  Patient continued to gym in w/c with supervision. Following NMR performed gait again with RW x 100' with min-mod A overall with decreased R lateral lean and pushing and improved L weight shift, improved safe management of RW and no episode of RLE buckling; still required mod A at times to maintain upright posture and for full L lateral weight shifting for full RLE step length. Other Treatments: Treatments Neuromuscular Facilitation: Right;Left;Lower Extremity;Activity to increase motor control;Activity to increase coordination;Activity to increase timing and sequencing;Activity to increase lateral weight shifting;Activity to increase anterior-posterior weight shifting;Activity to increase sustained activation;Activity to increase grading in standing beginning with static standing with LUE reaching high along wall; patient cued to maintain LUE placement on wall and reach upwards for full L lateral weight shifting during controlled RLE foot taps to 4" step x 2 sets x 10 reps with min-mod A overall and verbal cues for slower, more controlled  taps.  Changed to NMR in hall ways during 2 reps L lateral stepping x 25' with LUE reaching high on wall  for increased L weight shifting, forward gait x 25' with LUE reaching up and forwards to maintain contact with wall for L lateral weight shifting, and R side stepping x 25' x 2 reps with RLE performing closed chain hip ABD activation during pushing of bosu on floor during lateral stepping all with min-mod A overall and intermittent sitting rest breaks.    See FIM for current functional status  Therapy/Group: Individual Therapy  [name] [facility] 12/16/2012, 11:27 AM

## 2012-12-16 NOTE — Progress Notes (Signed)
Patient ID: [name], male   DOB: 20-Aug-1950, 61 y.o.   MRN: [id number] Subjective/Complaints: Occ SOB in bed, appears comfortable wheeling WC  Still with wet voice, difficulty clearing secretions Agreed to PEG,afraid of choking on food Review of Systems  Respiratory: Positive for cough. Negative for shortness of breath.        Fast breathing  Gastrointestinal: Positive for nausea. Negative for vomiting.     Objective: Vital Signs: Blood pressure 151/83, pulse 89, temperature 98.5 F (36.9 C), temperature source Oral, resp. rate 18, height 5\' 8"  (1.727 m), weight 76.114 kg (167 lb 12.8 oz), SpO2 95.00%.  Results for orders placed during the hospital encounter of 12/09/12 (from the past 72 hour(s))  GLUCOSE, CAPILLARY     Status: None   Collection Time    12/13/12 11:50 AM      Result Value Range   Glucose-Capillary 97  70 - 99 mg/dL   Comment 1 Notify RN    GLUCOSE, CAPILLARY     Status: Abnormal   Collection Time    12/13/12  4:26 PM      Result Value Range   Glucose-Capillary 124 (*) 70 - 99 mg/dL   Comment 1 Notify RN    GLUCOSE, CAPILLARY     Status: Abnormal   Collection Time    12/13/12  8:47 PM      Result Value Range   Glucose-Capillary 108 (*) 70 - 99 mg/dL   Comment 1 Notify RN    GLUCOSE, CAPILLARY     Status: Abnormal   Collection Time    12/14/12  6:09 AM      Result Value Range   Glucose-Capillary 104 (*) 70 - 99 mg/dL   Comment 1 Notify RN    GLUCOSE, CAPILLARY     Status: Abnormal   Collection Time    12/14/12 11:17 AM      Result Value Range   Glucose-Capillary 106 (*) 70 - 99 mg/dL   Comment 1 Notify RN    GLUCOSE, CAPILLARY     Status: Abnormal   Collection Time    12/14/12  4:30 PM      Result Value Range   Glucose-Capillary 102 (*) 70 - 99 mg/dL   Comment 1 Notify RN    GLUCOSE, CAPILLARY     Status: Abnormal   Collection Time    12/14/12  8:44 PM      Result Value Range   Glucose-Capillary 117 (*) 70 - 99 mg/dL   Comment 1 Notify  RN    GLUCOSE, CAPILLARY     Status: Abnormal   Collection Time    12/15/12  6:06 AM      Result Value Range   Glucose-Capillary 107 (*) 70 - 99 mg/dL   Comment 1 Notify RN    GLUCOSE, CAPILLARY     Status: Abnormal   Collection Time    12/15/12 11:23 AM      Result Value Range   Glucose-Capillary 100 (*) 70 - 99 mg/dL  GLUCOSE, CAPILLARY     Status: None   Collection Time    12/15/12  5:03 PM      Result Value Range   Glucose-Capillary 99  70 - 99 mg/dL  GLUCOSE, CAPILLARY     Status: Abnormal   Collection Time    12/15/12  8:58 PM      Result Value Range   Glucose-Capillary 117 (*) 70 - 99 mg/dL  BASIC METABOLIC PANEL  Status: Abnormal   Collection Time    12/16/12  6:40 AM      Result Value Range   Sodium 139  135 - 145 mEq/L   Potassium 4.2  3.5 - 5.1 mEq/L   Chloride 102  96 - 112 mEq/L   CO2 31  19 - 32 mEq/L   Glucose, Bld 93  70 - 99 mg/dL   BUN 23  6 - 23 mg/dL   Creatinine, Ser 1.61  0.50 - 1.35 mg/dL   Calcium 9.7  8.4 - 09.6 mg/dL   GFR calc non Af Amer 64 (*) >90 mL/min   GFR calc Af Amer 74 (*) >90 mL/min   Comment:            The eGFR has been calculated     using the CKD EPI equation.     This calculation has not been     validated in all clinical     situations.     eGFR's persistently     <90 mL/min signify     possible Chronic Kidney Disease.  GLUCOSE, CAPILLARY     Status: None   Collection Time    12/16/12  7:18 AM      Result Value Range   Glucose-Capillary 94  70 - 99 mg/dL   Comment 1 Notify RN       HEENT: oromotor weakness Cardio: RRR and no murmur Resp: Rales and upper airway sounds, unlabored, breath sounds improve with cough GI: BS positive and non tender Extremity:  Pulses positive and No Edema Skin:   Intact Neuro: Alert/Oriented, Cranial Nerve Abnormalities CN 9 and 10, , Abnormal Motor 4/5 in BUE and BLE, Abnormal FMC Ataxic/ dec FMC and Dysarthric Musc/Skel:  Normal Gen NAD, hiccups   Assessment/Plan: 1. 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 Physiatrist is providing close team supervision and 24 hour management of active medical problems listed below. Physiatrist and rehab team continue to assess barriers to discharge/monitor patient progress toward functional and medical goals. FIM: FIM - Bathing Bathing Steps Patient Completed: Chest;Right Arm;Left Arm;Abdomen;Front perineal area;Buttocks;Right upper leg;Left upper leg;Right lower leg (including foot);Left lower leg (including foot) Bathing: 4: Steadying assist  FIM - Upper Body Dressing/Undressing Upper body dressing/undressing steps patient completed: Thread/unthread right sleeve of pullover shirt/dresss;Thread/unthread left sleeve of pullover shirt/dress;Put head through opening of pull over shirt/dress;Pull shirt over trunk Upper body dressing/undressing: 5: Set-up assist to: Obtain clothing/put away FIM - Lower Body Dressing/Undressing Lower body dressing/undressing steps patient completed: Thread/unthread right pants leg;Thread/unthread left pants leg;Pull pants up/down;Don/Doff right sock;Don/Doff left sock;Don/Doff right shoe;Don/Doff left shoe;Fasten/unfasten right shoe;Fasten/unfasten left shoe Lower body dressing/undressing: 5: Set-up assist to: Obtain clothing  FIM - Toileting Toileting steps completed by patient: Performs perineal hygiene Toileting Assistive Devices: Grab bar or rail for support Toileting: 4: Steadying assist  FIM - Diplomatic Services operational officer Devices: Grab bars Toilet Transfers: 4-To toilet/BSC: Min A (steadying Pt. > 75%);4-From toilet/BSC: Min A (steadying Pt. > 75%)  FIM - Banker Devices: Walker;Arm rests Bed/Chair Transfer: 4: Bed > Chair or W/C: Min A (steadying Pt. > 75%);4: Chair or W/C > Bed: Min A (steadying Pt. > 75%)  FIM -  Locomotion: Wheelchair Distance: 150 Locomotion: Wheelchair: 5: Travels 150 ft or more: maneuvers on rugs and over door sills with supervision, cueing or coaxing FIM - Locomotion: Ambulation Locomotion: Health visitor  Devices: Designer, industrial/product Ambulation/Gait Assistance: 3: Mod assist Locomotion: Ambulation: 3: Travels 150 ft or more with moderate assistance (Pt: 50 - 74%)  Comprehension Comprehension Mode: Auditory Comprehension: 5-Understands basic 90% of the time/requires cueing < 10% of the time  Expression Expression Mode: Verbal Expression: 4-Expresses basic 75 - 89% of the time/requires cueing 10 - 24% of the time. Needs helper to occlude trach/needs to repeat words.  Social Interaction Social Interaction: 5-Interacts appropriately 90% of the time - Needs monitoring or encouragement for participation or interaction.  Problem Solving Problem Solving: 5-Solves basic 90% of the time/requires cueing < 10% of the time  Memory Memory: 3-Recognizes or recalls 50 - 74% of the time/requires cueing 25 - 49% of the time  Medical Problem List and Plan:  1. DVT Prophylaxis/Anticoagulation: Pharmaceutical: Lovenox  2. Pain Management: N/A  3. Mood: Will have LCSW follow for evaluation.  4. Neuropsych: This patient is not capable of making decisions on his/her own behalf.  5. HTN: Monitor with bid checks. Continue HCTZ and Prinivil for now. May need to re-evaluate diuretic in face of modified diet and acute renal insufficiency.  6. COPD: Continue spiriva. Prn nebs for SOB. Keep HOB elevated at 30 degrees. Encourage IS.  7. Dysphagia,Severe:. ReCheck MBS showed safe for thin liquid D2 diet, monitoring for signs of aspiration PNA 8.  Hiccups, improved on baclofen, no sedation LOS (Days) 7 A FACE TO FACE EVALUATION WAS PERFORMED  [name],Yakir E 12/16/2012, 8:16 AM

## 2012-12-16 NOTE — Progress Notes (Signed)
Speech Language Pathology Daily Session Note & Weekly Progress Note  Patient Details  Name: [name] MRN: [id number] Date of Birth: [birth year]  Today's Date: 12/16/2012 Time: 4098-1191 Time Calculation (min): 45 min  Short Term Goals: Week 1: SLP Short Term Goal 1 (Week 1): Pt will self-monitor and correct wet vocal quality with Mod verbal and question cues.  SLP Short Term Goal 2 (Week 1): Pt will perform pharyngeal strengthening exercises with Min verbal cues.  SLP Short Term Goal 3 (Week 1): Pt will consume trials of ice chips with minimal overt s/s of aspiration with 50% of trials with Mod A verbal cues.  SLP Short Term Goal 4 (Week 1): Pt will utilize speech intelligibility strategies with Mod A verbal and question cues for 75% intelligibility at the conversation level SLP Short Term Goal 5 (Week 1): Pt will demonstrate emergent awareness of swallowing impairments with Mod A question and semantic cues. SLP Short Term Goal 6 (Week 1): Patient will consume Dys.2 textures and thin liquids with mod assist clinician cues to follow safe swallow strategies.  Skilled Therapeutic Interventions: Skilled treatment session focused on addressing dysphagia goals. SLP facilitated session with Dys.1 textures and thin liquids as well as max faded to mod assist verbal cues to consume small single bites and sips as well as recall and utilize 2 swallows followed by a cough and then 2 more swallows. Patient also required max assist clinician cues to take rest breaks. It appears that sticky food items like oatmeal, grits are more difficult to manage.  Patient demonstrated less coughing with applesauce and water.  Patient orally expectorated secretions which would contain traces of p.o. Patient spoke to SLP regarding decision for tube in stomach and that he wants to go to a SNF for more therapy and care after the hospital.  Patient required mod assist verbal cues to carryover use of speech intelligibility  strategies.    FIM:  Comprehension Comprehension Mode: Auditory Comprehension: 5-Understands basic 90% of the time/requires cueing < 10% of the time Expression Expression Mode: Verbal Expression: 4-Expresses basic 75 - 89% of the time/requires cueing 10 - 24% of the time. Needs helper to occlude trach/needs to repeat words. Social Interaction Social Interaction: 5-Interacts appropriately 90% of the time - Needs monitoring or encouragement for participation or interaction. Problem Solving Problem Solving: 5-Solves basic 90% of the time/requires cueing < 10% of the time Memory Memory: 3-Recognizes or recalls 50 - 74% of the time/requires cueing 25 - 49% of the time FIM - Eating Eating Activity: 5: Needs verbal cues/supervision  Pain Pain Assessment Pain Assessment: No/denies pain  Therapy/Group: Individual Therapy   Speech Language Pathology Weekly Progress Note  Patient Details  Name: [name] MRN: [id number] Date of Birth: 29-Oct-1950  Today's Date: 12/16/2012  Short Term Goals: Week 1: SLP Short Term Goal 1 (Week 1): Pt will self-monitor and correct wet vocal quality with Mod verbal and question cues.  SLP Short Term Goal 1 - Progress (Week 1): Met SLP Short Term Goal 2 (Week 1): Pt will perform pharyngeal strengthening exercises with Min verbal cues.  SLP Short Term Goal 2 - Progress (Week 1): Progressing toward goal SLP Short Term Goal 3 (Week 1): Pt will consume trials of ice chips with minimal overt s/s of aspiration with 50% of trials with Mod A verbal cues.  SLP Short Term Goal 3 - Progress (Week 1): Met SLP Short Term Goal 4 (Week 1): Pt will utilize speech intelligibility strategies with Mod  A verbal and question cues for 75% intelligibility at the conversation level SLP Short Term Goal 4 - Progress (Week 1): Progressing toward goal SLP Short Term Goal 5 (Week 1): Pt will demonstrate emergent awareness of swallowing impairments with Mod A question and semantic  cues. SLP Short Term Goal 5 - Progress (Week 1): Met SLP Short Term Goal 6 (Week 1): Patient will consume Dys.2 textures and thin liquids with mod assist clinician cues to follow safe swallow strategies. SLP Short Term Goal 6 - Progress (Week 1): Progressing toward goal Week 2: SLP Short Term Goal 1 (Week 2): Pt will self-monitor and correct wet vocal quality with Min verbal and question cues.  SLP Short Term Goal 2 (Week 2): Pt will perform pharyngeal strengthening exercises with Min verbal cues.  SLP Short Term Goal 3 (Week 2): Pt will utilize speech intelligibility strategies with Mod A verbal and question cues for 75% intelligibility at the conversation level SLP Short Term Goal 4 (Week 2): Pt will consume Dys.1 textures and thin liquids with min-mod assist clinician cues to follow safe swallow strategies.  Weekly Progress Updates: Patient met 3 out of 6 short term goals this reporting period due to gains in toleration of trials, initiation of diet with max assist cues to carryover safe swallow strategies and improved self-monitoring and in turn management of secretions via suction and oral expectoration.  Patient continues to require skilled SLP services to address carryover of safe swallow strategies and speech intelligibility strategies and toleration of diet.  Continue plan of care.    SLP Intensity: Minumum of 1-2 x/day, 30 to 90 minutes SLP Frequency: 5 out of 7 days SLP Duration/Estimated Length of Stay: 1 week SLP Treatment/Interventions: Dysphagia/aspiration precaution training;Neuromuscular electrical stimulation;Patient/family education;Therapeutic Activities;Oral motor exercises;Speech/Language facilitation;Therapeutic Exercise;Cueing hierarchy;Cognitive remediation/compensation  Charlane Ferretti., CCC-SLP [phone]  [name] 12/16/2012, 10:29 AM

## 2012-12-16 NOTE — Progress Notes (Signed)
Aware of request for G-tube placement for dysphagia Pt is s/p recent CVA  He is on Plavix, which will need to be held X 5 days. Pt received dose this morning.  Spoke to pt briefly regarding procedure. Will tentatively plan for mid next week once pt is off Plavix X 5 days.  Will follow up and place orders and further discuss once closer to expected procedure date. Discussed with Leida Lauth PA-C

## 2012-12-16 NOTE — Progress Notes (Signed)
Occupational Therapy Weekly Progress Note  Patient Details  Name: [name] MRN: [id number] Date of Birth: October 30, 1950  Today's Date: 12/16/2012 Time: 0732-0830 and 1305-1405 Time Calculation (min): 58 min and 30 min  Patient has met 3 of 5 short term goals.  Pt is making steady progress towards goals, he is currently close supervision (squat pivot) to mod assist (ambulating with RW) with transfers. Pt continues to demonstrate intermittent Rt lateral lean, pushing and LOB and intermittent RLE buckling during standing tasks requiring close supervision to min-mod assist when knee buckles.  Pt has been slow to progress overall and therefore goals have been downgraded from mod I overall to supervision.  D/C to prior living situation is unclear as treatment team is recommending 24/7 supervision which may not be available at boarding house.  Patient continues to demonstrate the following deficits: Rt sided weakness, impaired motor control, timing, sequencing, coordination, ataxia, impaired postural control, balance and therefore will continue to benefit from skilled OT intervention to enhance overall performance with BADL and Reduce care partner burden.  Patient progressing toward long term goals..  Plan of care revisions: downgraded from modified independent overall to supervision overall.  OT Short Term Goals Week 1:  OT Short Term Goal 1 (Week 1): Pt. will bath self using sit to stand with supervision OT Short Term Goal 1 - Progress (Week 1): Met OT Short Term Goal 2 (Week 1): Pt. will groom self with standing at sup for 5 minutes OT Short Term Goal 2 - Progress (Week 1): Progressing toward goal OT Short Term Goal 3 (Week 1): Pt. will dress self at supervision level OT Short Term Goal 3 - Progress (Week 1): Met OT Short Term Goal 4 (Week 1): Pt. will engage in toilet transfer at supervision level OT Short Term Goal 4 - Progress (Week 1): Progressing toward goal OT Short Term Goal 5 (Week 1):  Pt. will transfer to shower stall at minimal assist level OT Short Term Goal 5 - Progress (Week 1): Met Week 2:  OT Short Term Goal 1 (Week 2): Pt. will groom self with standing at sup for 5 minutes OT Short Term Goal 2 (Week 2): Pt. will engage in toilet transfer at supervision level OT Short Term Goal 3 (Week 2): Pt will transfer to shower stall at supervision level  Skilled Therapeutic Interventions/Progress Updates:    1)Pt seen for ADL retraining at sit to stand level at sink. Addressed functional mobility with focus on gathering dirty towels and putting them away, opening and closing closet and room doors, walker safety with turning and crossing thresholds. Pt's assist level for functional mobility varied from close supervision to mod A with LOB while backing up and some turns as he tends to push his walker out of the way with turns. Pt with frequent lean to Rt with ambulation and shuffling gait when tired and Rt foot scissoring pattern with decreased coordination and motor planning. Engaged in standing activity without UE, pt demonstrated 2 large LOB with dynamic standing with simple reaching task within BOS with increased challenge to integrate weight shifting and RLE stepping pattern.  2) Pt seen for 1:1 OT with focus on transfers, standing balance with BUE activity, and FMC with various tasks.  Pt demonstrated squat pivot transfers this session with close supervision and cues to take his time.  No LOB during standing task this session with 1 UE support on table in standing.  Noted slight decrease in [facility] with turning over cards, which  improved with repetition.  Therapy Documentation Precautions:  Precautions Precautions: Fall Precaution Comments: dys 1 diet, thin liquids.  2 swallows, hard cough, followed by 2 swallows with each bite/sip Restrictions Weight Bearing Restrictions: No General:   Vital Signs: Therapy Vitals Temp: 98.2 F (36.8 C) Temp src: Oral Pulse Rate: 81 Resp:  17 BP: 145/88 mmHg Patient Position, if appropriate: Lying Oxygen Therapy SpO2: 97 % O2 Device: None (Room air) Pain: Pain Assessment Pain Assessment: No/denies pain Pain Score: 0-No pain Patients Stated Pain Goal: 0 Multiple Pain Sites: No ADL: ADL Grooming: Supervision/safety Where Assessed-Grooming: Sitting at sink Upper Body Bathing: Supervision/safety Where Assessed-Upper Body Bathing: Shower Lower Body Bathing: Minimal assistance Where Assessed-Lower Body Bathing: Shower Upper Body Dressing: Supervision/safety Where Assessed-Upper Body Dressing: Edge of bed Lower Body Dressing: Supervision/safety Where Assessed-Lower Body Dressing: Edge of bed Toilet Transfer: Minimal assistance Toilet Transfer Method: Proofreader: Engineer, manufacturing systems Transfer: Minimal assistance Film/video editor Method: Designer, industrial/product: Shower seat with back;Grab bars  See FIM for current functional status  Therapy/Group: Individual Therapy  Leonette Monarch 12/16/2012, 11:43 AM

## 2012-12-17 ENCOUNTER — Inpatient Hospital Stay (HOSPITAL_COMMUNITY): Payer: Medicaid Other | Admitting: Speech Pathology

## 2012-12-17 ENCOUNTER — Inpatient Hospital Stay (HOSPITAL_COMMUNITY): Payer: Medicaid Other

## 2012-12-17 DIAGNOSIS — C61 Malignant neoplasm of prostate: Secondary | ICD-10-CM

## 2012-12-17 LAB — GLUCOSE, CAPILLARY: Glucose-Capillary: 139 mg/dL — ABNORMAL HIGH (ref 70–99)

## 2012-12-17 NOTE — Progress Notes (Signed)
[name] is a 62 y.o. male Aug 06, 1951 [id number]  Subjective: No new complaints. No new problems. Slept well. Feeling OK.  Objective: Vital signs in last 24 hours: Temp:  [97.4 F (36.3 C)-98.2 F (36.8 C)] 97.8 F (36.6 C) (05/10 0542) Pulse Rate:  [69-82] 69 (05/10 0542) Resp:  [17-18] 18 (05/10 0542) BP: (123-160)/(78-98) 160/98 mmHg (05/10 0542) SpO2:  [97 %] 97 % (05/10 0542) Weight:  [168 lb 11.2 oz (76.522 kg)] 168 lb 11.2 oz (76.522 kg) (05/10 0542) Weight change:  Last BM Date: 12/12/12 (refused laxative)  Intake/Output from previous day: 05/09 0701 - 05/10 0700 In: 1380 [P.O.:480; I.V.:900] Out: 1101 [Urine:1100; Stool:1] Last cbgs: CBG (last 3)   Recent Labs  12/16/12 1652 12/16/12 2016 12/17/12 0752  GLUCAP 92 156* 95     Physical Exam General: No apparent distress    HEENT: moist mucosa Lungs: Normal effort. Lungs clear to auscultation, no crackles or wheezes. Cardiovascular: Regular rate and rhythm, no edema Musculoskeletal:  No change from before Neurological: No new neurological deficits Wounds: N/A    Skin: clear Alert, cooperative   Lab Results: BMET    Component Value Date/Time   NA 139 12/16/2012 0640   K 4.2 12/16/2012 0640   CL 102 12/16/2012 0640   CO2 31 12/16/2012 0640   GLUCOSE 93 12/16/2012 0640   BUN 23 12/16/2012 0640   CREATININE 1.18 12/16/2012 0640   CREATININE 0.99 10/18/2012 0927   CALCIUM 9.7 12/16/2012 0640   GFRNONAA 64* 12/16/2012 0640   GFRAA 74* 12/16/2012 0640   CBC    Component Value Date/Time   WBC 8.0 12/12/2012 0510   RBC 4.44 12/12/2012 0510   HGB 13.1 12/12/2012 0510   HCT 38.5* 12/12/2012 0510   PLT 294 12/12/2012 0510   MCV 86.7 12/12/2012 0510   MCH 29.5 12/12/2012 0510   MCHC 34.0 12/12/2012 0510   RDW 14.1 12/12/2012 0510   LYMPHSABS 2.4 12/12/2012 0510   MONOABS 1.1* 12/12/2012 0510   EOSABS 0.2 12/12/2012 0510   BASOSABS 0.0 12/12/2012 0510    Studies/Results: No results found.  Medications: I have reviewed the patient's  current medications.  Assessment/Plan:   1. DVT Prophylaxis/Anticoagulation: Pharmaceutical: Lovenox  2. Pain Management: N/A  3. Mood: Will have LCSW follow for evaluation.  4. Neuropsych: This patient is not capable of making decisions on his/her own behalf.  5. HTN: Monitor with bid checks. Continue HCTZ and Prinivil for now. May need to re-evaluate diuretic in face of modified diet and acute renal insufficiency.  6. COPD: Continue spiriva. Prn nebs for SOB. Keep HOB elevated at 30 degrees. Encourage IS.  7. Dysphagia,Severe:. ReCheck MBS showed safe for thin liquid D2 diet, monitoring for signs of aspiration PNA  8. Hiccups, improved on baclofen, no sedation    Length of stay, days: 8  Sonda Primes , MD 12/17/2012, 8:24 AM

## 2012-12-17 NOTE — Progress Notes (Signed)
Informed Dr. Posey Rea of patient's inability to eat without coughing and gurgling.  CXR ordered to rule out aspiration and changed diet to dysphagia II per Dr. Posey Rea.

## 2012-12-17 NOTE — Progress Notes (Signed)
SLP Cancellation Note  Patient Details Name: [name] MRN: [id number] DOB: 15-Aug-1950   Cancelled treatment:        Patient missed 30 minutes of skilled SLP services due to refusal to participate.  SLP attempted to educate patient regarding rationale of pharyngeal strengthening exercises and purpose to improve swallow function.  Patient stated that he did not want to swallow anymore today.    Fae Pippin, M.A., CCC-SLP [phone]  [name] 12/17/2012, 3:34 PM

## 2012-12-17 NOTE — Progress Notes (Signed)
Occupational Therapy Session Note  Patient Details  Name: [name] MRN: [id number] Date of Birth: [birth year]  Today's Date: 12/17/2012 Time: 4098-1191 Time Calculation (min): 42 min  Short Term Goals: Week 2:  OT Short Term Goal 1 (Week 2): Pt. will groom self with standing at sup for 5 minutes OT Short Term Goal 2 (Week 2): Pt. will engage in toilet transfer at supervision level OT Short Term Goal 3 (Week 2): Pt will transfer to shower stall at supervision level  Skilled Therapeutic Interventions: ADL-retraining at walk-in shower with emphasis on safety, dynamic sitting/standing balance, functional mobility using RW, and reinforcement of adherence to self-feeding precautions.   During self-feeding pt initiated coughs to clear his throat and suction, prn, but related lack of confidence with 2-swallow method stating, "yeah, but it doesn't work all the time." Patient complete bathing with close supervision and occasional contact guard (steadying) d/t rapid movements and rapid pace despite verbal cues to slow down.  During functional mobility with RW patient demo's weakness at R-LE resulting in impaired gait.   Patient demo'd LOB x2 while turning in/near shower and during functional mobility in hallway while turning and pivoting on his right leg.    Therapy Documentation Precautions:  Precautions Precautions: Fall Precaution Comments: dys 1 diet, thin liquids.  2 swallows, hard cough, followed by 2 swallows with each bite/sip Restrictions Weight Bearing Restrictions: No  Pain: Pain Assessment Pain Assessment: No/denies pain  See FIM for current functional status  Therapy/Group: Individual Therapy  Georgeanne Nim 12/17/2012, 9:43 AM

## 2012-12-18 ENCOUNTER — Inpatient Hospital Stay (HOSPITAL_COMMUNITY): Payer: Medicaid Other | Admitting: Physical Therapy

## 2012-12-18 DIAGNOSIS — R1310 Dysphagia, unspecified: Secondary | ICD-10-CM

## 2012-12-18 DIAGNOSIS — I10 Essential (primary) hypertension: Secondary | ICD-10-CM

## 2012-12-18 DIAGNOSIS — J449 Chronic obstructive pulmonary disease, unspecified: Secondary | ICD-10-CM

## 2012-12-18 LAB — GLUCOSE, CAPILLARY
Glucose-Capillary: 95 mg/dL (ref 70–99)
Glucose-Capillary: 85 mg/dL (ref 70–99)
Glucose-Capillary: 82 mg/dL (ref 70–99)

## 2012-12-18 NOTE — Progress Notes (Signed)
Patient ID: [name], male   DOB: 1951/04/21, 62 y.o.   MRN: [id number] [name] is a 62 y.o. male [birth year] [id number]  Subjective: No new complaints. Per staff: chocking on food, coughing during meals. Slept well. Feeling OK.  Objective: Vital signs in last 24 hours: Temp:  [98.2 F (36.8 C)-98.6 F (37 C)] 98.2 F (36.8 C) (05/11 0439) Pulse Rate:  [75] 75 (05/11 0439) Resp:  [19] 19 (05/10 1516) BP: (136-143)/(85-91) 136/85 mmHg (05/11 0439) SpO2:  [95 %-98 %] 95 % (05/11 0439) Weight change:  Last BM Date: 12/16/12  Intake/Output from previous day: 05/10 0701 - 05/11 0700 In: 1070 [P.O.:170; I.V.:900] Out: 1300 [Urine:1300] Last cbgs: CBG (last 3)   Recent Labs  12/17/12 1123 12/17/12 1647 12/17/12 2154  GLUCAP 109* 105* 139*     Physical Exam General: No apparent distress    HEENT: moist mucosa Lungs: Normal effort. Lungs clear to auscultation, no crackles or wheezes this am. Cardiovascular: Regular rate and rhythm, no edema Musculoskeletal:  No change from before Neurological: No new neurological deficits Wounds: N/A    Skin: clear Alert, cooperative. His speech is difficult to understand   Lab Results: BMET    Component Value Date/Time   NA 139 12/16/2012 0640   K 4.2 12/16/2012 0640   CL 102 12/16/2012 0640   CO2 31 12/16/2012 0640   GLUCOSE 93 12/16/2012 0640   BUN 23 12/16/2012 0640   CREATININE 1.18 12/16/2012 0640   CREATININE 0.99 10/18/2012 0927   CALCIUM 9.7 12/16/2012 0640   GFRNONAA 64* 12/16/2012 0640   GFRAA 74* 12/16/2012 0640   CBC    Component Value Date/Time   WBC 8.0 12/12/2012 0510   RBC 4.44 12/12/2012 0510   HGB 13.1 12/12/2012 0510   HCT 38.5* 12/12/2012 0510   PLT 294 12/12/2012 0510   MCV 86.7 12/12/2012 0510   MCH 29.5 12/12/2012 0510   MCHC 34.0 12/12/2012 0510   RDW 14.1 12/12/2012 0510   LYMPHSABS 2.4 12/12/2012 0510   MONOABS 1.1* 12/12/2012 0510   EOSABS 0.2 12/12/2012 0510   BASOSABS 0.0 12/12/2012 0510    Studies/Results: Dg Chest 2  View  12/17/2012  *RADIOLOGY REPORT*  Clinical Data: Cough and congestion.  Question aspiration.  CHEST - 2 VIEW  Comparison: Two-view chest 12/13/2012.  Findings: Heart size is normal.  And nodular densities are again seen in both lungs.  Bilateral pleural effusions are slightly more prominent than on the prior exam.  No significant airspace consolidation is evident to suggest aspiration.  The visualized soft tissues and bony thorax are unremarkable.  IMPRESSION:  1.  No evidence for significant aspiration. 2.  Slight increase in bilateral pleural effusions. 3.  Stable bilateral nodular densities and both lungs.  Mild edema or atypical airspace disease is not excluded.   Original Report Authenticated By: Marin Roberts, M.D.     Medications: I have reviewed the patient's current medications.  Assessment/Plan:   1. DVT Prophylaxis/Anticoagulation: Pharmaceutical: Lovenox  2. Pain Management: N/A  3. Mood: Will have LCSW follow for evaluation.  4. Neuropsych: This patient is not capable of making decisions on his/her own behalf.  5. HTN: Monitor with bid checks. Continue HCTZ and Prinivil for now. May need to re-evaluate diuretic in face of modified diet and acute renal insufficiency.  6. COPD: Continue spiriva. Prn nebs for SOB. Keep HOB elevated at 30 degrees. Encourage IS. We did another CXR on 5/10 7. Dysphagia,Severe. We changed him to D2  diet - he had clinical signs of aspiration per staff. 8. Hiccups, improved on baclofen, no sedation    Length of stay, days: 9  Sonda Primes , MD 12/18/2012, 8:48 AM

## 2012-12-18 NOTE — Progress Notes (Signed)
Physical Therapy Note  Patient Details  Name: [name] MRN: [id number] Date of Birth: 1951/04/29 Today's Date: 12/18/2012  1450-1530 (40 minutes) individual Pain: no reported pain Focus of treatment: Therapeutic exercise focused on Rt LE coordination/control/ activity tolerance; gait training Treatment: wc mobility SBA unit using bilateral UE (120 feet); transfers stand/turn RW min assist; Nustep Level 4 X 10 minutes for activity tolerance; gait 80 feet X 2 RW min assist with increased lean to right, decreased timing swing on right; up/down 4 inch step RT LE only for quad strengthening (pt intermittently hyperextends RT knee); alternate stepping to 4 inch step without UE support max assist to prevent fall secondary to decreased ability to place RT LE consistently; stepping to targets on floor RT LE mod assist for balance; supine heel to toe coordination exercises (pt able to consistently place RT heel on knee).    Josilyn Shippee,JIM 12/18/2012, 3:00 PM

## 2012-12-19 ENCOUNTER — Inpatient Hospital Stay (HOSPITAL_COMMUNITY): Payer: Medicaid Other

## 2012-12-19 ENCOUNTER — Inpatient Hospital Stay (HOSPITAL_COMMUNITY): Payer: Medicaid Other | Admitting: Physical Therapy

## 2012-12-19 ENCOUNTER — Inpatient Hospital Stay (HOSPITAL_COMMUNITY): Payer: Medicaid Other | Admitting: Occupational Therapy

## 2012-12-19 DIAGNOSIS — I69921 Dysphasia following unspecified cerebrovascular disease: Secondary | ICD-10-CM

## 2012-12-19 DIAGNOSIS — I69991 Dysphagia following unspecified cerebrovascular disease: Secondary | ICD-10-CM

## 2012-12-19 DIAGNOSIS — I69922 Dysarthria following unspecified cerebrovascular disease: Secondary | ICD-10-CM

## 2012-12-19 DIAGNOSIS — I6330 Cerebral infarction due to thrombosis of unspecified cerebral artery: Secondary | ICD-10-CM

## 2012-12-19 NOTE — Progress Notes (Signed)
Physical Therapy Session Note  Patient Details  Name: [name] MRN: [id number] Date of Birth: 11-04-1950  Today's Date: 12/19/2012 Time: 1100-1159 and 7829-5621 Time Calculation (min): 59 min and 28 min  Short Term Goals: Week 1:  PT Short Term Goal 1 (Week 1): Pt will perform functional transfers with min A PT Short Term Goal 1 - Progress (Week 1): Met PT Short Term Goal 2 (Week 1): Pt will gait in controlled environment min A 150' PT Short Term Goal 2 - Progress (Week 1): Progressing toward goal Week 2:  PT Short Term Goal 1 (Week 2): = LTG of supervision-min A overall  Therapy Documentation Precautions:  Precautions Precautions: Fall Precaution Comments: dys 1 diet, thin liquids.  2 swallows, hard cough, followed by 2 swallows with each bite/sip Restrictions Weight Bearing Restrictions: No Pain:  No c/o pain Mobility:  Performed supine > sit EOB with mod I; performed transfers bed > w/c <> mat with supervision with verbal cues to perform transfer from w/c more safely; patient transferring without removing leg rests from w/c.  Sit <> supine and rolling to L and R on mat with mod I.  Returned to w/c squat pivot and to room with w/c mobility x 150' supervision.    Locomotion : Began with pre gait training for lateral weight shifting, single limb stance training and motor control, grading of movement and coordination training during alternating toe taps to styrofoam cups on 4" step (as feedback for pressure and grading of step up) with bilat UE support with focus on maintaining foot on cup for 2-3 seconds at a time with min-mod A overall for full lateral weight shifting and verbal cues for placement of R foot for stability in stance.  Patient performs very quickly and demonstrates poor attention to task, self monitoring or correcting.   High Level Ambulation High Level Ambulation: Side stepping;Backwards walking Side Stepping: with no UE support x 30' each direction with mod A with  verbal cues for full lateral step and step together to facilitate full lateral weight shifting Backwards Walking: x 10' each direction x 5-6 reps with mod A with verbal cues for full step length and foot clearance and manual facilitation for trunk and pelvic anterior and posterior rotation Wheelchair Mobility Wheelchair Mobility: Yes Wheelchair Assistance: 5: Supervision Wheelchair Propulsion: Both upper extremities x 150' x 2 reps  Other Treatments: Treatments Neuromuscular Facilitation: Right;Left;Lower Extremity;Activity to increase coordination;Activity to increase motor control;Activity to increase timing and sequencing;Activity to increase grading;Activity to increase sustained activation;Activity to increase lateral weight shifting;Activity to increase anterior-posterior weight shifting beginning in R and L sidelying with focus on sustained activation, stabilization and control of hip ABD during 2 sets x 12 reps each theraband resisted clamshells, gravity resisted open chain hip ABD full range, end range ABD pulses, isometric ABD with forward and backward rotations, isometric ABD with controlled knee flexion <> extension.  Supine isometric hip ABD with bridges 2 sets x 10 reps, bilat therapy ball hamstring curls x 10 reps bilat with mod A.  Performed standing lateral weight shifts and single limb stance training during controlled, alternating R and L toe taps to 4" block with min-mod A still with R lateral LOB during R toe taps and R buckling during R stance when BOS too narrow and poor grading of RLE tapping even with total verbal cues and manual facilitation to assist with maintaining L lateral and anterior weight shift.  Multiple LOB posterior and to the R onto mat.  See FIM for current functional status  Therapy/Group: Individual Therapy  [name] 12/19/2012, 4:29 PM

## 2012-12-19 NOTE — Progress Notes (Signed)
Occupational Therapy Session Note  Patient Details  Name: [name] MRN: [id number] Date of Birth: 29-Jul-1951  Today's Date: 12/19/2012 Time: 0732-0830 Time Calculation (min): 58 min  Short Term Goals: Week 2:  OT Short Term Goal 1 (Week 2): Pt. will groom self with standing at sup for 5 minutes OT Short Term Goal 2 (Week 2): Pt. will engage in toilet transfer at supervision level OT Short Term Goal 3 (Week 2): Pt will transfer to shower stall at supervision level  Skilled Therapeutic Interventions/Progress Updates:    Pt seen for ADL retraining with focus on functional mobility, safety with RW and transfers, and static and dynamic standing balance. Pt completed bathing at sit to stand level in room shower with use of grab bars and shower chair for safety and occasional steady assist secondary to quick movements despite cues to slow down. Pt required min-mod assist with ambulation with RW, with increased assist when turning and walking backwards. Verbal cues provided to slow down to increase safety with mobility. Pt completed bathing with close supervision - min/steady assist when standing to wash buttocks. Educated pt on completing LB dressing at sit to stand level to increase safety as decreased dynamic balance and balance reactions. Pt completed grooming in standing with min/steady assist secondary to RLE weakness.  Engaged in static and dynamic standing balance activity in therapy gym with focus on RLE control in standing while completed BUE task with reaching overhead and outside BOS.  Pt with decreased LOB with wider stance, still requiring contact guard assist.  Therapy Documentation Precautions:  Precautions Precautions: Fall Precaution Comments: dys 1 diet, thin liquids.  2 swallows, hard cough, followed by 2 swallows with each bite/sip Restrictions Weight Bearing Restrictions: No Pain: Pain Assessment Pain Assessment: 0-10 Pain Score: 0-No pain Patients Stated Pain Goal:  0 Multiple Pain Sites: No  See FIM for current functional status  Therapy/Group: Individual Therapy  Leonette Monarch 12/19/2012, 10:26 AM

## 2012-12-19 NOTE — Progress Notes (Signed)
Speech Language Pathology Daily Session Note  Patient Details  Name: [name] MRN: [id number] Date of Birth: 1951/01/15  Today's Date: 12/19/2012 Time: 4098-1191 Time Calculation (min): 45 min  Short Term Goals: Week 2: SLP Short Term Goal 1 (Week 2): Pt will self-monitor and correct wet vocal quality with Min verbal and question cues.  SLP Short Term Goal 2 (Week 2): Pt will perform pharyngeal strengthening exercises with Min verbal cues.  SLP Short Term Goal 3 (Week 2): Pt will utilize speech intelligibility strategies with Mod A verbal and question cues for 75% intelligibility at the conversation level SLP Short Term Goal 4 (Week 2): Pt will consume Dys.1 textures and thin liquids with min-mod assist clinician cues to follow safe swallow strategies.  Skilled Therapeutic Interventions: Skilled treatment session focused on dysphagia goals. SLP facilitated session by providing additional wait time with Max verbal/visual cues to cough with each bite. Pt used multiple swallows with each bite/sip with modified independence, and required Mod verbal/visual cues to use a slow rate. Pt consumed Dys 2 textures, but used puree (applesauce, pudding) to facilitate oropharyngeal clearance. Continue current plan of care.   FIM:  Comprehension Comprehension Mode: Auditory Comprehension: 5-Follows basic conversation/direction: With no assist Expression Expression Mode: Verbal Expression: 4-Expresses basic 75 - 89% of the time/requires cueing 10 - 24% of the time. Needs helper to occlude trach/needs to repeat words. Social Interaction Social Interaction: 4-Interacts appropriately 75 - 89% of the time - Needs redirection for appropriate language or to initiate interaction. Problem Solving Problem Solving: 4-Solves basic 75 - 89% of the time/requires cueing 10 - 24% of the time Memory Memory: 4-Recognizes or recalls 75 - 89% of the time/requires cueing 10 - 24% of the time FIM - Eating Eating  Activity: 5: Set-up assist for open containers;5: Needs verbal cues/supervision  Pain Pain Assessment Pain Assessment: No/denies pain Pain Score: 0-No pain Patients Stated Pain Goal: 0 Multiple Pain Sites: No  Therapy/Group: Individual Therapy  Maxcine Ham 12/19/2012, 12:06 PM  Maxcine Ham, M.A. CCC-SLP

## 2012-12-19 NOTE — Progress Notes (Signed)
Patient ID: [name], male   DOB: May 01, 1951, 62 y.o.   MRN: [id number] Subjective/Complaints: Occ SOB in bed, appears comfortable wheeling WC  Still with wet voice, difficulty clearing secretions Agreed to PEG,afraid of choking on food Appreciate IR note needs to be off plavix x 5 days Review of Systems  Respiratory: Positive for cough. Negative for shortness of breath.        Fast breathing  Gastrointestinal: Positive for nausea. Negative for vomiting.     Objective: Vital Signs: Blood pressure 153/82, pulse 77, temperature 98 F (36.7 C), temperature source Oral, resp. rate 16, height 5\' 8"  (1.727 m), weight 76.522 kg (168 lb 11.2 oz), SpO2 96.00%.  Results for orders placed during the hospital encounter of 12/09/12 (from the past 72 hour(s))  GLUCOSE, CAPILLARY     Status: Abnormal   Collection Time    12/16/12 11:07 AM      Result Value Range   Glucose-Capillary 101 (*) 70 - 99 mg/dL   Comment 1 Notify RN    GLUCOSE, CAPILLARY     Status: None   Collection Time    12/16/12  4:52 PM      Result Value Range   Glucose-Capillary 92  70 - 99 mg/dL  GLUCOSE, CAPILLARY     Status: Abnormal   Collection Time    12/16/12  8:16 PM      Result Value Range   Glucose-Capillary 156 (*) 70 - 99 mg/dL  GLUCOSE, CAPILLARY     Status: None   Collection Time    12/17/12  7:52 AM      Result Value Range   Glucose-Capillary 95  70 - 99 mg/dL  GLUCOSE, CAPILLARY     Status: Abnormal   Collection Time    12/17/12 11:23 AM      Result Value Range   Glucose-Capillary 109 (*) 70 - 99 mg/dL  GLUCOSE, CAPILLARY     Status: Abnormal   Collection Time    12/17/12  4:47 PM      Result Value Range   Glucose-Capillary 105 (*) 70 - 99 mg/dL  GLUCOSE, CAPILLARY     Status: Abnormal   Collection Time    12/17/12  9:54 PM      Result Value Range   Glucose-Capillary 139 (*) 70 - 99 mg/dL  GLUCOSE, CAPILLARY     Status: None   Collection Time    12/18/12  8:23 AM      Result Value Range    Glucose-Capillary 95  70 - 99 mg/dL  GLUCOSE, CAPILLARY     Status: None   Collection Time    12/18/12 11:26 AM      Result Value Range   Glucose-Capillary 85  70 - 99 mg/dL  GLUCOSE, CAPILLARY     Status: None   Collection Time    12/18/12  4:50 PM      Result Value Range   Glucose-Capillary 82  70 - 99 mg/dL     HEENT: oromotor weakness Cardio: RRR and no murmur Resp: Rales and upper airway sounds, unlabored, breath sounds improve with cough GI: BS positive and non tender Extremity:  Pulses positive and No Edema Skin:   Intact Neuro: Alert/Oriented, Cranial Nerve Abnormalities CN 9 and 10, , Abnormal Motor 4/5 in BUE and BLE, Abnormal FMC Ataxic/ dec FMC and Dysarthric Musc/Skel:  Normal Gen NAD, hiccups   Assessment/Plan: 1. 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 Physiatrist is providing close team supervision and 24 hour management of active medical problems listed below. Physiatrist and rehab team continue to assess barriers to discharge/monitor patient progress toward functional and medical goals. FIM: FIM - Bathing Bathing Steps Patient Completed: Chest;Right Arm;Left Arm;Abdomen;Front perineal area;Buttocks;Right upper leg;Left upper leg;Right lower leg (including foot);Left lower leg (including foot) Bathing: 4: Steadying assist  FIM - Upper Body Dressing/Undressing Upper body dressing/undressing steps patient completed: Thread/unthread right sleeve of pullover shirt/dresss;Thread/unthread left sleeve of pullover shirt/dress;Put head through opening of pull over shirt/dress;Pull shirt over trunk Upper body dressing/undressing: 5: Supervision: Safety issues/verbal cues FIM - Lower Body Dressing/Undressing Lower body dressing/undressing steps patient completed: Thread/unthread right underwear leg;Thread/unthread left underwear leg;Pull underwear  up/down;Thread/unthread right pants leg;Thread/unthread left pants leg;Pull pants up/down;Fasten/unfasten pants;Don/Doff right sock;Don/Doff left sock;Don/Doff right shoe;Don/Doff left shoe;Fasten/unfasten right shoe;Fasten/unfasten left shoe Lower body dressing/undressing: 5: Supervision: Safety issues/verbal cues  FIM - Toileting Toileting steps completed by patient: Adjust clothing prior to toileting;Performs perineal hygiene Toileting Assistive Devices: Grab bar or rail for support Toileting: 4: Assist with fasteners  FIM - Diplomatic Services operational officer Devices: Therapist, music Transfers: 0-Activity did not occur  FIM - Banker Devices: Therapist, occupational: 4: Bed > Chair or W/C: Min A (steadying Pt. > 75%);5: Supine > Sit: Supervision (verbal cues/safety issues)  FIM - Locomotion: Wheelchair Distance: 150 Locomotion: Wheelchair: 5: Travels 150 ft or more: maneuvers on rugs and over door sills with supervision, cueing or coaxing FIM - Locomotion: Ambulation Locomotion: Ambulation Assistive Devices: Designer, industrial/product Ambulation/Gait Assistance: 2: Max assist Locomotion: Ambulation: 2: Travels 50 - 149 ft with maximal assistance (Pt: 25 - 49%)  Comprehension Comprehension Mode: Auditory Comprehension: 5-Understands complex 90% of the time/Cues < 10% of the time  Expression Expression Mode: Verbal Expression: 5-Expresses basic needs/ideas: With extra time/assistive device  Social Interaction Social Interaction: 4-Interacts appropriately 75 - 89% of the time - Needs redirection for appropriate language or to initiate interaction.  Problem Solving Problem Solving: 4-Solves basic 75 - 89% of the time/requires cueing 10 - 24% of the time  Memory Memory: 4-Recognizes or recalls 75 - 89% of the time/requires cueing 10 - 24% of the time  Medical Problem List and Plan:  1. DVT Prophylaxis/Anticoagulation: Pharmaceutical:  Lovenox  2. Pain Management: N/A  3. Mood: Will have LCSW follow for evaluation.  4. Neuropsych: This patient is not capable of making decisions on his/her own behalf.  5. HTN: Monitor with bid checks. Continue HCTZ and Prinivil for now. May need to re-evaluate diuretic in face of modified diet and acute renal insufficiency.  6. COPD: Continue spiriva. Prn nebs for SOB. Keep HOB elevated at 30 degrees. Encourage IS.  7. Dysphagia,Severe:. ReCheck MBS showed safe for thin liquid D2 diet, monitoring for signs of aspiration PNA, intake poor , plan PEG once off Plavix 8.  Hiccups, improved on baclofen, no sedation LOS (Days) 10 A FACE TO FACE EVALUATION WAS PERFORMED  [name],Daud E 12/19/2012, 8:54 AM

## 2012-12-20 ENCOUNTER — Encounter (HOSPITAL_COMMUNITY): Payer: Self-pay | Admitting: Radiology

## 2012-12-20 ENCOUNTER — Inpatient Hospital Stay (HOSPITAL_COMMUNITY): Payer: Medicaid Other | Admitting: Speech Pathology

## 2012-12-20 ENCOUNTER — Inpatient Hospital Stay (HOSPITAL_COMMUNITY): Payer: Medicaid Other | Admitting: Occupational Therapy

## 2012-12-20 ENCOUNTER — Inpatient Hospital Stay (HOSPITAL_COMMUNITY): Payer: Medicaid Other | Admitting: Physical Therapy

## 2012-12-20 MED ORDER — VANCOMYCIN HCL IN DEXTROSE 1-5 GM/200ML-% IV SOLN
1000.0000 mg | INTRAVENOUS | Status: AC
  Administered 2012-12-21: 1000 mg via INTRAVENOUS
  Filled 2012-12-20: qty 200

## 2012-12-20 NOTE — H&P (Signed)
HPI: [name] is an 62 y.o. male who suffered a CVA a few weeks ago. He is recovering well in Rehab but has persistent dysphagia. Though he is somewhat tolerating DYS 2 diet, he is not meeting his nutritional needs. IR is requested to place G-tube. His Plavix has been held since 5/9 and no other issues have surfaced. He is stable to proceed with G-tube placement. PMHx and meds reviewed.   Past Medical History:  Past Medical History  Diagnosis Date  . Hypertension 05/18/2007  . COPD 05/18/2007  . Elevated PSA 05/25/2007  . 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 Stroke 12/05/2012    Past Surgical History:  Past Surgical History  Procedure Laterality Date  . 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 Liver disease Mother   . Heart attack Mother 55    Social History:  reports that he has been smoking Cigarettes.  He has been smoking about 0.33 packs per day. He has never used smokeless tobacco. He reports that he drinks about 7.0 ounces of alcohol per week. He reports that he does not use illicit drugs.  Allergies:  Allergies  Allergen Reactions  . Aspirin Hives  . Penicillins Hives    Medications: Medications Prior to Admission  Medication Sig Dispense Refill  . albuterol (PROVENTIL HFA;VENTOLIN HFA) 108 (90 BASE) MCG/ACT inhaler Inhale 2 puffs into the lungs every 6 (six) hours as needed for wheezing.  8.5 g  11  . antiseptic oral rinse (BIOTENE) LIQD 15 mLs by Mouth Rinse route 2 times daily at 12 noon and 4 pm.      . atorvastatin (LIPITOR) 80 MG tablet Take 1 tablet (80 mg total) by mouth daily at 6 PM.      . clopidogrel (PLAVIX) 75 MG tablet Take 1 tablet (75 mg total) by mouth daily.  30 tablet  11  . hydrochlorothiazide (HYDRODIURIL) 25 MG tablet Take 1 tablet (25 mg total) by mouth daily.      Marland Kitchen lisinopril (PRINIVIL,ZESTRIL) 10 MG tablet Take 1 tablet (10 mg total) by mouth daily.  30  tablet  11  . tiotropium (SPIRIVA HANDIHALER) 18 MCG inhalation capsule Place 1 capsule (18 mcg total) into inhaler and inhale daily.  30 capsule  11  . Water For Irrigation, Sterile (FREE WATER) SOLN Place 160 mLs into feeding tube every 6 (six) hours.        Please HPI for pertinent positives, otherwise complete 10 system ROS negative.  Physical Exam: Blood pressure 167/95, pulse 71, temperature 98 F (36.7 C), temperature source Oral, resp. rate 19, height 5\' 8"  (1.727 m), weight 168 lb 11.2 oz (76.522 kg), SpO2 97.00%. Body mass index is 25.66 kg/(m^2).   General Appearance:  Alert, cooperative, no distress, appears stated age  Head:  Normocephalic, without obvious abnormality, atraumatic  ENT: Unremarkable  Neck: Supple, symmetrical, trachea midline,   Lungs:   Clear to auscultation bilaterally, no w/r/r, respirations unlabored without use of accessory muscles.  Heart:  Regular rate and rhythm, S1, S2 normal, no murmur, rub or gallop.   Abdomen:   Soft, non-tender, non distended. No scars  Neurologic: Normal affect, no gross deficits.   Results for orders placed during the hospital encounter of 12/09/12 (from the past 48 hour(s))  GLUCOSE, CAPILLARY     Status: None   Collection Time    12/18/12  4:50 PM  Result Value Range   Glucose-Capillary 82  70 - 99 mg/dL   No results found.  Assessment/Plan Dysphagia secondary to recent CVA Discussed G-tube placement, risks, complications, use of sedation. Plavix has been held appropriately. Will check some labs. Orders in for Barium to be given tonight. Check KUB in am Consent signed in chart  Brayton El PA-C 12/20/2012, 12:55 PM

## 2012-12-20 NOTE — Progress Notes (Signed)
Patient ID: [name], male   DOB: 1951/03/27, 62 y.o.   MRN: [id number] Subjective/Complaints: Occ SOB in bed, appears comfortable wheeling WC  Still with wet voice, difficulty clearing secretions Agreed to PEG,afraid of choking on food Appreciate IR note needs to be off plavix x 5 days Review of Systems  Respiratory: Positive for cough. Negative for shortness of breath.        Fast breathing  Gastrointestinal: Positive for nausea. Negative for vomiting.     Objective: Vital Signs: Blood pressure 162/81, pulse 71, temperature 98 F (36.7 C), temperature source Oral, resp. rate 19, height 5\' 8"  (1.727 m), weight 76.522 kg (168 lb 11.2 oz), SpO2 95.00%.  Results for orders placed during the hospital encounter of 12/09/12 (from the past 72 hour(s))  GLUCOSE, CAPILLARY     Status: Abnormal   Collection Time    12/17/12 11:23 AM      Result Value Range   Glucose-Capillary 109 (*) 70 - 99 mg/dL  GLUCOSE, CAPILLARY     Status: Abnormal   Collection Time    12/17/12  4:47 PM      Result Value Range   Glucose-Capillary 105 (*) 70 - 99 mg/dL  GLUCOSE, CAPILLARY     Status: Abnormal   Collection Time    12/17/12  9:54 PM      Result Value Range   Glucose-Capillary 139 (*) 70 - 99 mg/dL  GLUCOSE, CAPILLARY     Status: None   Collection Time    12/18/12  8:23 AM      Result Value Range   Glucose-Capillary 95  70 - 99 mg/dL  GLUCOSE, CAPILLARY     Status: None   Collection Time    12/18/12 11:26 AM      Result Value Range   Glucose-Capillary 85  70 - 99 mg/dL  GLUCOSE, CAPILLARY     Status: None   Collection Time    12/18/12  4:50 PM      Result Value Range   Glucose-Capillary 82  70 - 99 mg/dL     HEENT: oromotor weakness Cardio: RRR and no murmur Resp: Rales and upper airway sounds, unlabored, breath sounds improve with cough GI: BS positive and non tender Extremity:  Pulses positive and No Edema Skin:   Intact Neuro: Alert/Oriented, Cranial Nerve Abnormalities CN 9  and 10, , Abnormal Motor 4/5 in BUE and BLE, Abnormal FMC Ataxic/ dec FMC and Dysarthric Musc/Skel:  Normal Gen NAD, hiccups   Assessment/Plan: 1. 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 Physiatrist is providing close team supervision and 24 hour management of active medical problems listed below. Physiatrist and rehab team continue to assess barriers to discharge/monitor patient progress toward functional and medical goals. FIM: FIM - Bathing Bathing Steps Patient Completed: Chest;Right Arm;Left Arm;Abdomen;Front perineal area;Buttocks;Right upper leg;Left upper leg;Right lower leg (including foot);Left lower leg (including foot) Bathing: 4: Steadying assist  FIM - Upper Body Dressing/Undressing Upper body dressing/undressing steps patient completed: Thread/unthread right sleeve of pullover shirt/dresss;Thread/unthread left sleeve of pullover shirt/dress;Put head through opening of pull over shirt/dress;Pull shirt over trunk Upper body dressing/undressing: 5: Supervision: Safety issues/verbal cues FIM - Lower Body Dressing/Undressing Lower body dressing/undressing steps patient completed: Thread/unthread right underwear leg;Thread/unthread left underwear leg;Pull underwear up/down;Thread/unthread right pants leg;Thread/unthread left pants leg;Pull pants up/down;Fasten/unfasten pants;Don/Doff right sock;Don/Doff left sock;Don/Doff right shoe;Don/Doff left shoe;Fasten/unfasten right shoe;Fasten/unfasten left shoe Lower body dressing/undressing: 5:  Supervision: Safety issues/verbal cues  FIM - Toileting Toileting steps completed by patient: Adjust clothing prior to toileting;Performs perineal hygiene Toileting Assistive Devices: Grab bar or rail for support Toileting: 4: Assist with fasteners  FIM - Diplomatic Services operational officer Devices: Grab bars Toilet  Transfers: 0-Activity did not occur  FIM - Banker Devices: HOB elevated;Arm rests Bed/Chair Transfer: 6: Supine > Sit: No assist;6: Sit > Supine: No assist;5: Bed > Chair or W/C: Supervision (verbal cues/safety issues);5: Chair or W/C > Bed: Supervision (verbal cues/safety issues)  FIM - Locomotion: Wheelchair Distance: 150 Locomotion: Wheelchair: 5: Travels 150 ft or more: maneuvers on rugs and over door sills with supervision, cueing or coaxing FIM - Locomotion: Ambulation Locomotion: Ambulation Assistive Devices: Designer, industrial/product Ambulation/Gait Assistance: 2: Max assist Locomotion: Ambulation: 0: Activity did not occur  Comprehension Comprehension Mode: Auditory Comprehension: 7-Follows complex conversation/direction: With no assist  Expression Expression Mode: Verbal Expression: 7-Expresses complex ideas: With no assist  Social Interaction Social Interaction: 7-Interacts appropriately with others - No medications needed.  Problem Solving Problem Solving: 7-Solves complex problems: Recognizes & self-corrects  Memory Memory: 7-Complete Independence: No helper  Medical Problem List and Plan:  1. DVT Prophylaxis/Anticoagulation: Pharmaceutical: Lovenox  2. Pain Management: N/A  3. Mood: Will have LCSW follow for evaluation.  4. Neuropsych: This patient is not capable of making decisions on his/her own behalf.  5. HTN: Monitor with bid checks. Continue HCTZ and Prinivil for now. May need to re-evaluate diuretic in face of modified diet and acute renal insufficiency.  6. COPD: Continue spiriva. Prn nebs for SOB. Keep HOB elevated at 30 degrees. Encourage IS.  7. Dysphagia,Severe:. ReCheck MBS showed safe for thin liquid D2 diet, monitoring for signs of aspiration PNA, intake poor , plan PEG once off Plavix 8.  Hiccups, improved on baclofen, no sedation LOS (Days) 11 A FACE TO FACE EVALUATION WAS PERFORMED  Worthy Boschert,Danyel  E 12/20/2012, 8:12 AM

## 2012-12-20 NOTE — Progress Notes (Signed)
Occupational Therapy Session Note  Patient Details  Name: FRANKLYN CAFARO MRN: [id number] Date of Birth: September 22, 1950  Today's Date: 12/20/2012 Time: 7829-5621 Time Calculation (min): 57 min  Short Term Goals: Week 2:  OT Short Term Goal 1 (Week 2): Pt. will groom self with standing at sup for 5 minutes OT Short Term Goal 2 (Week 2): Pt. will engage in toilet transfer at supervision level OT Short Term Goal 3 (Week 2): Pt will transfer to shower stall at supervision level  Skilled Therapeutic Interventions/Progress Updates:    Pt seen for ADL retraining with focus on functional mobility and static and dynamic standing balance. Pt completed UB bathing and dressing at sink with distant supervision, close supervision with sit to stand to pull up pants.  Engaged in dynamic sitting and standing balance with ball toss activity in sitting progressing to standing to increase challenge.  Use of rebounder in standing with ball with focus on balance reactions and challenging balance by encouraging pt to narrow BOS as he tends to default to wide BOS.  Pt with improved balance reactions this session, ambulated ~100 feet with contact guard - steady assist with 1 LOB when distracted by person in hallway.    Therapy Documentation Precautions:  Precautions Precautions: Fall Precaution Comments: dys 1 diet, thin liquids.  2 swallows, hard cough, followed by 2 swallows with each bite/sip Restrictions Weight Bearing Restrictions: No General:   Vital Signs: Therapy Vitals Temp: 98 F (36.7 C) Temp src: Oral Pulse Rate: 71 Resp: 19 BP: 162/81 mmHg Patient Position, if appropriate: Sitting Oxygen Therapy SpO2: 95 % O2 Device: None (Room air) Pain:  Pt with no c/o pain this session.  See FIM for current functional status  Therapy/Group: Individual Therapy  Leonette Monarch 12/20/2012, 8:35 AM

## 2012-12-20 NOTE — Progress Notes (Signed)
Speech Language Pathology Daily Session Note  Patient Details  Name: [name] MRN: [id number] Date of Birth: 1950-08-30  Today's Date: 12/20/2012 Time: 9147-8295 Time Calculation (min): 40 min  Short Term Goals: Week 2: SLP Short Term Goal 1 (Week 2): Pt will self-monitor and correct wet vocal quality with Min verbal and question cues.  SLP Short Term Goal 2 (Week 2): Pt will perform pharyngeal strengthening exercises with Min verbal cues.  SLP Short Term Goal 3 (Week 2): Pt will utilize speech intelligibility strategies with Mod A verbal and question cues for 75% intelligibility at the conversation level SLP Short Term Goal 4 (Week 2): Pt will consume Dys.1 textures and thin liquids with min-mod assist clinician cues to follow safe swallow strategies.  Skilled Therapeutic Interventions: Skilled treatment session focused on dysphagia goals. SLP facilitated session with breakfast try of Dys.2 textures and thin liquids; providing additional wait time with Max verbal/demonstrative cues for intermittent cough and Mod assist verbal cues for small portions. Patient used multiple swallows with each bite/sip with Min assist verbal cues and required Mod verbal/visual cues to use a slow rate. Patient consumed Dys. 2 textures with use of applesauce to facilitate reducing oral residue. Continue with current plan of care.   FIM:  Comprehension Comprehension Mode: Auditory Comprehension: 5-Follows basic conversation/direction: With no assist Expression Expression Mode: Verbal Expression: 4-Expresses basic 75 - 89% of the time/requires cueing 10 - 24% of the time. Needs helper to occlude trach/needs to repeat words. Social Interaction Social Interaction: 4-Interacts appropriately 75 - 89% of the time - Needs redirection for appropriate language or to initiate interaction. Problem Solving Problem Solving: 4-Solves basic 75 - 89% of the time/requires cueing 10 - 24% of the time Memory Memory:  4-Recognizes or recalls 75 - 89% of the time/requires cueing 10 - 24% of the time FIM - Eating Eating Activity: 5: Set-up assist for open containers;5: Needs verbal cues/supervision  Pain Pain Assessment Pain Assessment: No/denies pain  Therapy/Group: Individual Therapy  Charlane Ferretti., CCC-SLP [phone]  [name] 12/20/2012, 12:19 PM

## 2012-12-20 NOTE — Progress Notes (Signed)
Physical Therapy Session Note  Patient Details  Name: [name] MRN: [id number] Date of Birth: 1950-10-30  Today's Date: 12/20/2012 Time: 1100-1158 and 6644-0347 Time Calculation (min): 58 min and 29 min  Skilled Therapeutic Interventions/Progress Updates:   PM session: patient performed w/c mob x 150' in controlled environment x 2 reps with supervision; discussed transportation options upon D/C and patient reports that his cousin will be providing supervision and transportation; verbalized and practiced simulated low car transfer stand pivot with RW with verbal cues for sitting first and then placing LE into car with supervision-min A.  Discussed patient's falls risk and demonstrated to patient how to perform floor transfer, safety and indications for calling EMS; patient gave repeat demonstration with supervision of floor > mat transfer.  Continued gait training with household obstacles with RW while weaving L and R, tight turns, around cones and stepping over low obstacles x 2-4 reps each with min A for stepping over obstacles but mod A for turning with verbal cues and assistance for safe management of RW around cones and verbal cues for full step length while pivoting/turning and mod A at times to maintain balance.  Patient still with impaired lateral weight shifts and shuffling gait while pivoting.  Returned to room and to bed with supervision.   Therapy Documentation Precautions:  Precautions Precautions: Fall Precaution Comments: dys 1 diet, thin liquids.  2 swallows, hard cough, followed by 2 swallows with each bite/sip Restrictions Weight Bearing Restrictions: No Vital Signs: Therapy Vitals BP: 167/95 mmHg Oxygen Therapy SpO2: 97 % O2 Device: None (Room air) Pain: Pain Assessment Pain Assessment: No/denies pain Mobility:  Performed supine to sit mod I and transfers bed <> w/c squat pivot supervision.  W/c mobility in controlled environment x 150' supervision.   Locomotion  : Ambulation Ambulation/Gait Assistance: 4: Min Lawyer Distance: 150  Other Treatments: Treatments Neuromuscular Facilitation: Right;Lower Extremity;Activity to increase motor control;Activity to increase timing and sequencing;Activity to increase grading;Activity to increase sustained activation;Activity to increase lateral weight shifting;Activity to increase anterior-posterior weight shifting;Forced use;Activity to increase coordination beginning on Kinetron during static standing beginning with bilat >> No UE support with focus on maintaining activation of RLE for L lateral weight shift to keep COG in midline; added in reaching with LUE up, forwards and to the L to place horseshoes on tall target to facilitate weight shift and RLE activation x 10 reps x 2 sets with min A.  Continued training with sit > stand from seat without UE support with manual facilitation to assist with RLE anterior translation of femur and tibia to bring COG over BOS secondary to patient performing sit > stand through bilat knee hyperextension and maintaining COG posterior to BOS x 10 reps total; performed dynamic training with 2 sets x 2 minutes of lateral weight shifting and LE extension to push Kinetron pedal to floor with min-mod A to maintain upright trunk and full lateral weight shifting.  Continued NMR off of Kinetron in standing with UE support on RW with LLE beside mat and RLE behind corner performing isolated RLE hip and knee flexion to place knee on mat and perform anterior rotation of R pelvis and trunk for full terminal hip extension x 10 reps.  Performed RLE eccentric and concentric strengthening and motor control on stairs while negotiating 5 stairs x 2 reps with bilat UE support ascending with RLE and descending with LLE with step to sequencing.  Patient requested to return to room in w/c total A secondary  to fatigue.    See FIM for current functional status  Therapy/Group: Individual  Therapy  [name] [facility] 12/20/2012, 12:21 PM

## 2012-12-21 ENCOUNTER — Inpatient Hospital Stay (HOSPITAL_COMMUNITY): Payer: Medicaid Other

## 2012-12-21 ENCOUNTER — Inpatient Hospital Stay (HOSPITAL_COMMUNITY): Payer: Medicaid Other | Admitting: Speech Pathology

## 2012-12-21 ENCOUNTER — Inpatient Hospital Stay (HOSPITAL_COMMUNITY): Payer: Medicaid Other | Admitting: Occupational Therapy

## 2012-12-21 ENCOUNTER — Inpatient Hospital Stay (HOSPITAL_COMMUNITY): Payer: Medicaid Other | Admitting: Physical Therapy

## 2012-12-21 LAB — GLUCOSE, CAPILLARY: Glucose-Capillary: 90 mg/dL (ref 70–99)

## 2012-12-21 LAB — CBC
MCV: 86.6 fL (ref 78.0–100.0)
Platelets: 403 10*3/uL — ABNORMAL HIGH (ref 150–400)
RDW: 13.1 % (ref 11.5–15.5)
WBC: 9 10*3/uL (ref 4.0–10.5)

## 2012-12-21 LAB — PROTIME-INR
INR: 1.04 (ref 0.00–1.49)
Prothrombin Time: 13.5 seconds (ref 11.6–15.2)

## 2012-12-21 MED ORDER — IOHEXOL 300 MG/ML  SOLN
50.0000 mL | Freq: Once | INTRAMUSCULAR | Status: AC | PRN
  Administered 2012-12-21: 10 mL

## 2012-12-21 MED ORDER — FENTANYL CITRATE 0.05 MG/ML IJ SOLN
INTRAMUSCULAR | Status: AC | PRN
  Administered 2012-12-21: 50 ug via INTRAVENOUS
  Administered 2012-12-21: 25 ug via INTRAVENOUS

## 2012-12-21 MED ORDER — MIDAZOLAM HCL 2 MG/2ML IJ SOLN
INTRAMUSCULAR | Status: AC | PRN
  Administered 2012-12-21 (×2): 1 mg via INTRAVENOUS

## 2012-12-21 NOTE — Progress Notes (Signed)
Occupational Therapy Session Note  Patient Details  Name: [name] MRN: [id number] Date of Birth: 1951/01/22  Today's Date: 12/21/2012 Time: 4098-1191 Time Calculation (min): 30 min  Short Term Goals: Week 2:  OT Short Term Goal 1 (Week 2): Pt. will groom self with standing at sup for 5 minutes OT Short Term Goal 2 (Week 2): Pt. will engage in toilet transfer at supervision level OT Short Term Goal 3 (Week 2): Pt will transfer to shower stall at supervision level  Skilled Therapeutic Interventions/Progress Updates:  Pt seen for 1:1 OT with focus on standing balance and balance reactions with and without UE support. Engaged in ball toss with min assist at hips to maintain upright standing balance when shifting outside BOS. Increased challenge by having pt narrow BOS. Pt with increased standing tolerance and balance this session.  Pt with more intelligible speech this session with fewer secretions.  Upgraded pt to mod I level with squat pivot transfers bed <> w/c.   Therapy Documentation Precautions:  Precautions Precautions: Fall Precaution Comments: dys 1 diet, thin liquids.  2 swallows, hard cough, followed by 2 swallows with each bite/sip Restrictions Weight Bearing Restrictions: No General:   Vital Signs: Therapy Vitals Temp: 98.1 F (36.7 C) Temp src: Oral Pulse Rate: 72 Resp: 16 BP: 159/94 mmHg Patient Position, if appropriate: Sitting Pain: Pain Assessment Pain Assessment: No/denies pain  See FIM for current functional status  Therapy/Group: Individual Therapy  Leonette Monarch 12/21/2012, 4:23 PM

## 2012-12-21 NOTE — Progress Notes (Signed)
Patient ID: [name], male   DOB: [birth year], 62 y.o.   MRN: [id number] Subjective/Complaints: Continues with cough and congestion in throat  Still with wet voice, difficulty clearing secretions Agreed to PEG,afraid of choking on food Appreciate IR note needs to be off plavix x 5 days Review of Systems  Respiratory: Positive for cough. Negative for shortness of breath.        Fast breathing  Gastrointestinal: Positive for nausea. Negative for vomiting.     Objective: Vital Signs: Blood pressure 137/86, pulse 80, temperature 98.1 F (36.7 C), temperature source Oral, resp. rate 17, height 5\' 8"  (1.727 m), weight 76.522 kg (168 lb 11.2 oz), SpO2 98.00%.  Results for orders placed during the hospital encounter of 12/09/12 (from the past 72 hour(s))  GLUCOSE, CAPILLARY     Status: None   Collection Time    12/18/12 11:26 AM      Result Value Range   Glucose-Capillary 85  70 - 99 mg/dL  GLUCOSE, CAPILLARY     Status: None   Collection Time    12/18/12  4:50 PM      Result Value Range   Glucose-Capillary 82  70 - 99 mg/dL  CBC     Status: Abnormal   Collection Time    12/21/12  7:10 AM      Result Value Range   WBC 9.0  4.0 - 10.5 K/uL   RBC 4.39  4.22 - 5.81 MIL/uL   Hemoglobin 12.9 (*) 13.0 - 17.0 g/dL   HCT 40.9 (*) 81.1 - 91.4 %   MCV 86.6  78.0 - 100.0 fL   MCH 29.4  26.0 - 34.0 pg   MCHC 33.9  30.0 - 36.0 g/dL   RDW 78.2  95.6 - 21.3 %   Platelets 403 (*) 150 - 400 K/uL  PROTIME-INR     Status: None   Collection Time    12/21/12  7:10 AM      Result Value Range   Prothrombin Time 13.5  11.6 - 15.2 seconds   INR 1.04  0.00 - 1.49     HEENT: oromotor weakness Cardio: RRR and no murmur Resp: Rales and upper airway sounds, unlabored, breath sounds improve with cough GI: BS positive and non tender Extremity:  Pulses positive and No Edema Skin:   Intact Neuro: Alert/Oriented, Cranial Nerve Abnormalities CN 9 and 10, , Abnormal Motor 4/5 in BUE and BLE, Abnormal  FMC Ataxic/ dec FMC and Dysarthric Musc/Skel:  Normal Gen NAD, hiccups   Assessment/Plan: 1. 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 Physiatrist is providing close team supervision and 24 hour management of active medical problems listed below. Physiatrist and rehab team continue to assess barriers to discharge/monitor patient progress toward functional and medical goals. FIM: FIM - Bathing Bathing Steps Patient Completed: Chest;Right Arm;Left Arm;Abdomen Bathing: 5: Supervision: Safety issues/verbal cues  FIM - Upper Body Dressing/Undressing Upper body dressing/undressing steps patient completed: Thread/unthread right sleeve of pullover shirt/dresss;Thread/unthread left sleeve of pullover shirt/dress;Put head through opening of pull over shirt/dress;Pull shirt over trunk Upper body dressing/undressing: 5: Supervision: Safety issues/verbal cues FIM - Lower Body Dressing/Undressing Lower body dressing/undressing steps patient completed: Thread/unthread right underwear leg;Thread/unthread left underwear leg;Pull underwear up/down;Thread/unthread right pants leg;Thread/unthread left pants leg;Pull pants up/down;Fasten/unfasten pants;Don/Doff right sock;Don/Doff left sock;Don/Doff right shoe;Don/Doff left shoe;Fasten/unfasten right shoe;Fasten/unfasten left shoe Lower body dressing/undressing: 5: Supervision: Safety issues/verbal cues  FIM -  Toileting Toileting steps completed by patient: Adjust clothing prior to toileting;Performs perineal hygiene Toileting Assistive Devices: Grab bar or rail for support Toileting: 4: Assist with fasteners  FIM - Diplomatic Services operational officer Devices: Therapist, music Transfers: 0-Activity did not occur  FIM - Banker Devices: Arm rests Bed/Chair Transfer: 6: Supine > Sit: No assist;5:  Bed > Chair or W/C: Supervision (verbal cues/safety issues);5: Chair or W/C > Bed: Supervision (verbal cues/safety issues)  FIM - Locomotion: Wheelchair Distance: 150 Locomotion: Wheelchair: 5: Travels 150 ft or more: maneuvers on rugs and over door sills with supervision, cueing or coaxing FIM - Locomotion: Ambulation Locomotion: Ambulation Assistive Devices: Designer, industrial/product Ambulation/Gait Assistance: 4: Min assist Locomotion: Ambulation: 1: Travels less than 50 ft with minimal assistance (Pt.>75%)  Comprehension Comprehension Mode: Auditory Comprehension: 5-Follows basic conversation/direction: With no assist  Expression Expression Mode: Verbal Expression: 4-Expresses basic 75 - 89% of the time/requires cueing 10 - 24% of the time. Needs helper to occlude trach/needs to repeat words.  Social Interaction Social Interaction: 4-Interacts appropriately 75 - 89% of the time - Needs redirection for appropriate language or to initiate interaction.  Problem Solving Problem Solving: 4-Solves basic 75 - 89% of the time/requires cueing 10 - 24% of the time  Memory Memory: 4-Recognizes or recalls 75 - 89% of the time/requires cueing 10 - 24% of the time  Medical Problem List and Plan:  1. DVT Prophylaxis/Anticoagulation: Pharmaceutical: Lovenox  2. Pain Management: N/A  3. Mood: Will have LCSW follow for evaluation.  4. Neuropsych: This patient is not capable of making decisions on his/her own behalf.  5. HTN: Monitor with bid checks. Continue HCTZ and Prinivil for now. May need to re-evaluate diuretic in face of modified diet and acute renal insufficiency.  6. COPD: Continue spiriva. Prn nebs for SOB. Keep HOB elevated at 30 degrees. Encourage IS.  7. Dysphagia,Severe:. ReCheck MBS showed safe for thin liquid D2 diet, monitoring for signs of aspiration PNA, intake poor , plan PEG today, resume Plavix after procedure 8.  Hiccups, improved on baclofen, no sedation LOS (Days) 12 A FACE TO  FACE EVALUATION WAS PERFORMED  Reigan Tolliver,Amedeo E 12/21/2012, 10:21 AM

## 2012-12-21 NOTE — Progress Notes (Signed)
Speech Language Pathology Daily Session Note  Patient Details  Name: [name] MRN: [id number] Date of Birth: [birth year]  Today's Date: 12/21/2012 Time: 0915-0950 Time Calculation (min): 35 min  Short Term Goals: Week 2: SLP Short Term Goal 1 (Week 2): Pt will self-monitor and correct wet vocal quality with Min verbal and question cues.  SLP Short Term Goal 2 (Week 2): Pt will perform pharyngeal strengthening exercises with Min verbal cues.  SLP Short Term Goal 3 (Week 2): Pt will utilize speech intelligibility strategies with Mod A verbal and question cues for 75% intelligibility at the conversation level SLP Short Term Goal 4 (Week 2): Pt will consume Dys.1 textures and thin liquids with min-mod assist clinician cues to follow safe swallow strategies.  Skilled Therapeutic Interventions: Skilled treatment session focused on dysphagia and self-care goals. Patient NPO for PEG placement procedure; patient initially refused because of NPO status; SLP encouraged participation and patient agreed.  SLP facilitated session with Supervision level verbal cues to setup and perform oral care via suctioning.  SLP also facilitated session with Mod assist verbal and visual cues to perform pharyngeal strengthening exercises.  SLP educated on frequency and provided another handout to reinforce exercises.  Patient then refused to participate any further therapy because SLP could not give him anything to swallow, i.e. eat.     FIM:  Comprehension Comprehension Mode: Auditory Comprehension: 5-Follows basic conversation/direction: With no assist Expression Expression Mode: Verbal Expression: 4-Expresses basic 75 - 89% of the time/requires cueing 10 - 24% of the time. Needs helper to occlude trach/needs to repeat words. Social Interaction Social Interaction: 4-Interacts appropriately 75 - 89% of the time - Needs redirection for appropriate language or to initiate interaction. Problem Solving Problem  Solving: 4-Solves basic 75 - 89% of the time/requires cueing 10 - 24% of the time Memory Memory: 4-Recognizes or recalls 75 - 89% of the time/requires cueing 10 - 24% of the time FIM - Eating Eating Activity: 5: Set-up assist for open containers;5: Needs verbal cues/supervision  Pain Pain Assessment Pain Assessment: No/denies pain Pain Score: 0-No pain Patients Stated Pain Goal: 0 Multiple Pain Sites: No  Therapy/Group: Individual Therapy  Charlane Ferretti., CCC-SLP [phone]  [name] 12/21/2012, 11:19 AM

## 2012-12-21 NOTE — Progress Notes (Signed)
Occupational Therapy Session Note  Patient Details  Name: [name] MRN: [id number] Date of Birth: 20-Feb-1951  Today's Date: 12/21/2012 Time: 1015-1110 Time Calculation (min): 55 min  Short Term Goals: Week 2:  OT Short Term Goal 1 (Week 2): Pt. will groom self with standing at sup for 5 minutes OT Short Term Goal 2 (Week 2): Pt. will engage in toilet transfer at supervision level OT Short Term Goal 3 (Week 2): Pt will transfer to shower stall at supervision level  Skilled Therapeutic Interventions/Progress Updates:  Patient in bed upon arrival stating that he was scheduled to go down for a procedure (feeding tube placement).  Discussed need to continue to participate in all therapies until procedure is scheduled.  Patient showered on seat, donned hospital to prepare for procedure later in day.  Patient ambulated with R/W around his room and gathered all of his dirty clothes located in about 4 locations which required reaching outside of his BOS and turning his head.  Worked on walker safety when need to carry items.  Patient placed his clothes in the washer then ambulated all the way back to his room with RW.  Patient with LOB ~10 times throughout session and able to recover each time except 2 times ne needed min assist to recover.  Patient resting in w/c with all items within reach.  Therapy Documentation Precautions:  Precautions Precautions: Fall Precaution Comments: dys 1 diet, thin liquids.  2 swallows, hard cough, followed by 2 swallows with each bite/sip Restrictions Weight Bearing Restrictions: No Pain: Pain Assessment Pain Assessment: No/denies pain ADL: See FIM for current functional status  Therapy/Group: Individual Therapy  [name] 12/21/2012, 12:42 PM

## 2012-12-21 NOTE — Progress Notes (Signed)
Patient back to room from radiology for PEG tube. Dressing Clean dry intact to abdomen, tube clamped and capped. [name], [name]

## 2012-12-21 NOTE — Progress Notes (Signed)
Discussed patient blood pressure of 170/100 pulse 82 manually after returning from xray.Patient has had no medications today due to NPO status. Requesting something to eat, discussed with Marissa Nestle, PA, orders received to resume previous diet of Dys 2 thin liquids, Full Supervision, no straws, multiple swallows, intermittent cough, medications crushed in puree. [name], [name]

## 2012-12-21 NOTE — Procedures (Signed)
Procedure:  Percutaneous gastrostomy Findings:  20 Fr bumper retention gastrostomy tube placed with tip in body of stomach. OK to use for feeds in 24 hours.

## 2012-12-21 NOTE — ED Notes (Signed)
Patient denies pain and is resting comfortably.  

## 2012-12-21 NOTE — Progress Notes (Signed)
Physical Therapy Session Note  Patient Details  Name: [name] MRN: [id number] Date of Birth: [birth year]  Today's Date: 12/21/2012 Time: 1303-1400 Time Calculation (min): 57 min  Short Term Goals: Week 2:  PT Short Term Goal 1 (Week 2): = LTG of supervision-min A overall Week 3:     Skilled Therapeutic Interventions/Progress Updates:   Patient performed w/c mobility x 150' with supervision bilat UE propulsion.  Performed transfer w/c > Nustep with squat pivot and supervision.  Performed bilat UE and LE strengthening, coordination and endurance on Nustep at level 5 x 10 min + 5 minutes at level 6 with LE only for extensor strengthening and coordination.  Discussed stair set up for home entry/exit; reports 2 stairs to porch with no rails but wall on L side.  Discussed using RW and placing it on porch first and then use of RW for UE support during ascension and descending.  Demonstrated sequence to patient.  Patient gave repeat demonstration x 2 reps placing RW on porch first and then performing up and down 2 stairs with step to and alternating sequence with min A overall.  Patient reports that he has decided to pursue SNF placement for more prolonged rehab to reach higher independence level prior to D/C home to decrease burden of care.  Does not feel his cousin can handle current burden of care.  Will downgrade goals accordingly.  Continued sit > stand training from hi-lo mat at various heights beginning with tall height >> low height without UE use during 5-8 reps sit <> stand from each height with focus on concentric and eccentric control and focus on anterior translation of COG over BOS during sit > stand and full RLE hip and knee extension in stance.  Min A overall.  Performed gait x 150' with RW in controlled environment with min A overall with improved management of RW when turning and no evidence of RLE buckling, no lateral LOB even during head turns but still presents with some RLE  scissoring during gait.    Therapy Documentation Precautions:  Precautions Precautions: Fall Precaution Comments: dys 1 diet, thin liquids.  2 swallows, hard cough, followed by 2 swallows with each bite/sip Restrictions Weight Bearing Restrictions: No General:  Pt to go for PEG later this pm Vital Signs: Therapy Vitals Pulse Rate: 66 Pain: Pain Assessment Pain Assessment: No/denies pain Locomotion : Ambulation Ambulation/Gait Assistance: 4: Min assist Wheelchair Mobility Distance: 150   See FIM for current functional status  Therapy/Group: Individual Therapy  [name] [facility] 12/21/2012, 2:19 PM

## 2012-12-22 ENCOUNTER — Inpatient Hospital Stay (HOSPITAL_COMMUNITY): Payer: Medicaid Other | Admitting: Occupational Therapy

## 2012-12-22 ENCOUNTER — Inpatient Hospital Stay (HOSPITAL_COMMUNITY): Payer: Medicaid Other | Admitting: Speech Pathology

## 2012-12-22 ENCOUNTER — Inpatient Hospital Stay (HOSPITAL_COMMUNITY): Payer: Medicaid Other

## 2012-12-22 ENCOUNTER — Inpatient Hospital Stay (HOSPITAL_COMMUNITY): Payer: Medicaid Other | Admitting: *Deleted

## 2012-12-22 DIAGNOSIS — I6330 Cerebral infarction due to thrombosis of unspecified cerebral artery: Secondary | ICD-10-CM

## 2012-12-22 DIAGNOSIS — I69922 Dysarthria following unspecified cerebrovascular disease: Secondary | ICD-10-CM

## 2012-12-22 DIAGNOSIS — I69991 Dysphagia following unspecified cerebrovascular disease: Secondary | ICD-10-CM

## 2012-12-22 DIAGNOSIS — I69921 Dysphasia following unspecified cerebrovascular disease: Secondary | ICD-10-CM

## 2012-12-22 LAB — BASIC METABOLIC PANEL
BUN: 10 mg/dL (ref 6–23)
CO2: 30 mEq/L (ref 19–32)
Chloride: 102 mEq/L (ref 96–112)
Creatinine, Ser: 0.91 mg/dL (ref 0.50–1.35)
GFR calc Af Amer: >90 mL/min (ref >90)
Sodium: 140 mEq/L (ref 135–145)

## 2012-12-22 MED ORDER — JEVITY 1.2 CAL PO LIQD
200.0000 mL | Freq: Four times a day (QID) | ORAL | Status: DC
  Administered 2012-12-22: 50 mL
  Administered 2012-12-23 (×2): 200 mL
  Administered 2012-12-23: 13:00:00
  Administered 2012-12-23 – 2012-12-27 (×7): 200 mL
  Filled 2012-12-22 (×30): qty 237

## 2012-12-22 MED ORDER — BACITRACIN-NEOMYCIN-POLYMYXIN OINTMENT TUBE
TOPICAL_OINTMENT | Freq: Every day | CUTANEOUS | Status: AC
  Administered 2012-12-22 – 2012-12-28 (×5): via TOPICAL
  Filled 2012-12-22: qty 14
  Filled 2012-12-22: qty 15

## 2012-12-22 MED ORDER — BACLOFEN 20 MG PO TABS
20.0000 mg | ORAL_TABLET | Freq: Three times a day (TID) | ORAL | Status: DC
  Administered 2012-12-22 – 2012-12-27 (×14): 20 mg
  Filled 2012-12-22 (×17): qty 1

## 2012-12-22 MED ORDER — BACITRACIN-NEOMYCIN-POLYMYXIN OINTMENT TUBE: TOPICAL_OINTMENT | Freq: Every day | CUTANEOUS | Status: DC

## 2012-12-22 MED ORDER — LISINOPRIL 10 MG PO TABS
10.0000 mg | ORAL_TABLET | Freq: Every day | ORAL | Status: DC
  Administered 2012-12-23 – 2012-12-27 (×5): 10 mg
  Filled 2012-12-22 (×6): qty 1

## 2012-12-22 MED ORDER — HYDROCHLOROTHIAZIDE 25 MG PO TABS
25.0000 mg | ORAL_TABLET | Freq: Every day | ORAL | Status: DC
  Administered 2012-12-23 – 2013-01-02 (×11): 25 mg
  Filled 2012-12-22 (×14): qty 1

## 2012-12-22 MED ORDER — PANTOPRAZOLE SODIUM 40 MG PO PACK
40.0000 mg | PACK | Freq: Two times a day (BID) | ORAL | Status: DC
  Administered 2012-12-22 – 2013-01-02 (×22): 40 mg
  Filled 2012-12-22 (×29): qty 20

## 2012-12-22 MED ORDER — ATORVASTATIN CALCIUM 80 MG PO TABS
80.0000 mg | ORAL_TABLET | Freq: Every day | ORAL | Status: DC
Start: 2012-12-22 — End: 2013-01-02
  Administered 2012-12-22 – 2013-01-01 (×11): 80 mg
  Filled 2012-12-22 (×12): qty 1

## 2012-12-22 MED ORDER — GUAIFENESIN 100 MG/5ML PO SOLN
20.0000 mL | Freq: Three times a day (TID) | ORAL | Status: DC
Start: 2012-12-22 — End: 2013-01-02
  Administered 2012-12-22 – 2013-01-02 (×43): 400 mg
  Filled 2012-12-22 (×48): qty 20

## 2012-12-22 MED ORDER — POTASSIUM CHLORIDE 20 MEQ/15ML (10%) PO LIQD
20.0000 meq | Freq: Every day | ORAL | Status: DC
  Administered 2012-12-23 – 2013-01-02 (×11): 20 meq
  Filled 2012-12-22 (×14): qty 15

## 2012-12-22 MED ORDER — TRAZODONE HCL 50 MG PO TABS
25.0000 mg | ORAL_TABLET | Freq: Every evening | ORAL | Status: DC | PRN
  Administered 2013-01-01: 50 mg
  Filled 2012-12-22: qty 1

## 2012-12-22 NOTE — Plan of Care (Signed)
Problem: RH KNOWLEDGE DEFICIT Goal: RH STG INCREASE KNOWLEDGE OF DYSPHAGIA/FLUID INTAKE Patient/caregiver with min asssit will be able to verbalize strategies to maintain swallowing/aspiration precautions,correct food textures, medication administration and tube feeding thru PEG tube and peg care.Patient/caregiver will return demonstrate use of PEG tube for water flushes, tube feeds and medication administration. Outcome: Not Progressing Patient unable to and no family present for education

## 2012-12-22 NOTE — Progress Notes (Signed)
Social Work Patient ID: [name], male   DOB: 01-16-1951, 62 y.o.   MRN: [id number]   Met with patient and sister yesterday to review team conference.  Explaining that he will need to have 24/7 supervision and need for niece, Crystal to come in for education.  Her mother reports that Crystal is not able to provide this level of assistance.  Pt is agreed with plan to change d/c back to SNF.  Will prep paperwork and send out to area NHs.  Moreen Piggott, LCSW

## 2012-12-22 NOTE — Patient Care Conference (Signed)
Inpatient RehabilitationTeam Conference and Plan of Care Update Date: 12/21/2012   Time: 11:05 AM    Patient Name: [name]      Medical Record Number: [id number]  Date of Birth: [birth year] Sex: Male         Room/Bed: 4146/4146-01 Payor Info: Payor: MEDICAID [place] / Plan: MEDICAID [place] ACCESS / Product Type: *No Product type* /    Admitting Diagnosis: CVA, SPEECH THEP  Admit Date/Time:  12/09/2012  7:04 PM Admission Comments: No comment available   Primary Diagnosis:  Stroke, acute, embolic Principal Problem: Stroke, acute, embolic  Patient Active Problem List   Diagnosis Date Noted  . Hypertensive emergency 12/06/2012  . Stroke, acute, embolic 12/06/2012  . Malignant hypertension 12/05/2012  . Cerebral embolism with cerebral infarction 12/05/2012  . Dyslipidemia 12/05/2012  . Alcohol abuse 12/05/2012  . Hypokalemia 12/05/2012  . Claudication, intermittent 11/07/2012  . Smokes tobacco daily 10/18/2012  . History of stroke 10/14/2010  . Elevated PSA 05/25/2007  . Hypertension 05/18/2007  . COPD 05/18/2007    Expected Discharge Date: Expected Discharge Date: 12/27/12  Team Members Present: Physician leading conference: Dr. Claudette Laws Social Worker Present: Amada Jupiter, LCSW Nurse Present: Gregor Hams, RN PT Present: [name], PT;Caroline Adriana Simas, PT;Other (comment) Clarisse Gouge Ripa, PT) OT Present: Other (comment);Leonette Monarch, Felipa Eth, OT SLP Present: Fae Pippin, SLP     Current Status/Progress Goal Weekly Team Focus  Medical   Unable to maintain intake, frequent coughing, fear of choking  Maintain adequate caloric and fluid intake via enteral route  Take placement today   Bowel/Bladder   continent bowel/bladder  remain continent  remain continent   Swallow/Nutrition/ Hydration   Dys.2 textures and thin liquids with full staff supervision for carryover of safe swallow strategies; pending PEG placement  least restrictive p.o. intake    carryover of safe swallow strategies and family education   ADL's   Close supervision bathing, toileting, and dressing from sit to stand level.  Min-mod assist transfers with RW due to tendency to lean Rt, supervision squat pivot from w/c  downgraded to supervision overall, except mod I grooming and toileting  self-care retraining, functional transfers, static and dynamic standing balance, and family education   Mobility   Min-mod A; intermittent max A with gait during R lateral lean  Supervision transfers and ambulation, min A stairs, mod I w/c mobiltiy  Motor control, coordination, RLE sustained activation during transfers and gait   Communication   min-mod assist   supervision   increase use of compensatory strategies and family education    Safety/Cognition/ Behavioral Observations  min assist   supervision-min assist  increase safety awareness of new defiicts post CVA   Pain   denies pain  no pain   assess q shift prn   Skin   skin intact  no skin breakdown  assess skin q shift/ turn q2 prn      *See Care Plan and progress notes for long and short-term goals.  Barriers to Discharge: Needs PEG and needs to tolerate tube feeding    Possible Resolutions to Barriers:  See above    Discharge Planning/Teaching Needs:    Home with family vs. SNF -      Team Discussion:  For peg today.  Will likely need home suctionaing.  Need to confirm home assistance available  Revisions to Treatment Plan:  None   Continued Need for Acute Rehabilitation Level of Care: The patient requires daily medical management by a physician with specialized  training in physical medicine and rehabilitation for the following conditions: Daily direction of a multidisciplinary physical rehabilitation program to ensure safe treatment while eliciting the highest outcome that is of practical value to the patient.: Yes Daily medical management of patient stability for increased activity during participation in an  intensive rehabilitation regime.: Yes Daily analysis of laboratory values and/or radiology reports with any subsequent need for medication adjustment of medical intervention for : Neurological problems;Other  Rossie Bretado 12/22/2012, 4:03 PM

## 2012-12-22 NOTE — Progress Notes (Signed)
Occupational Therapy Session Note  Patient Details  Name: BRONSEN SERANO MRN: [id number] Date of Birth: Jul 08, 1951  Today's Date: 12/22/2012 Time: 1030-1130 and 4098-1191 Time Calculation (min): 60 min and 30 min  Short Term Goals: Week 2:  OT Short Term Goal 1 (Week 2): Pt. will groom self with standing at sup for 5 minutes OT Short Term Goal 2 (Week 2): Pt. will engage in toilet transfer at supervision level OT Short Term Goal 3 (Week 2): Pt will transfer to shower stall at supervision level  Skilled Therapeutic Interventions/Progress Updates:    1) Engaged in therapeutic activities with focus on standing balance, RLE knee control with squats to increase sit <> stand, and balance reactions when reaching outside BOS.  Pt donned clothes seated EOB, c/o pain in stomach secondary to PEG placement.  Engaged in card game in standing with focus on weight shifting outside BOS to obtain cards and engaging in squats every other move to increase knee control necessary for transfers and sit <> stand.  Pt with increased activity tolerance this session and increased intelligible speech.  2) 1:1 OT with focus on standing balance when reaching outside BOS and limits of stability on Biodex.  Pt finishing lunch upon arrival, verbal cues provided for swallow strategies with multiple swallows and a hard cough between bites.  Engaged in standing activity with clothespins with reaching across body to hand pins on clothesline to challenge weight shifting and balance reactions.  Pt required occasional steady assist when reaching across body.  Engaged in Biodex balance activity with limits of stability activity, pt with decreased weight shift Lt forward diagonal.  Results of limits of stability activity were 54% and 61%.  Therapy Documentation Precautions:  Precautions Precautions: Fall Precaution Comments: dys 1 diet, thin liquids.  2 swallows, hard cough, followed by 2 swallows with each  bite/sip Restrictions Weight Bearing Restrictions: No General:   Vital Signs: Therapy Vitals Pulse Rate: 84 BP: 149/84 mmHg Patient Position, if appropriate: Lying Oxygen Therapy SpO2: 95 % O2 Device: None (Room air) Pain: Pain Assessment Pain Assessment: 0-10 Pain Score:   6 Pain Type: Surgical pain Pain Location: Abdomen Pain Descriptors: Aching Pain Onset: Gradual Patients Stated Pain Goal: 3 Pain Intervention(s): RN made aware Multiple Pain Sites: No  See FIM for current functional status  Therapy/Group: Individual Therapy  Leonette Monarch 12/22/2012, 12:24 PM

## 2012-12-22 NOTE — Progress Notes (Signed)
Speech Language Pathology Daily Session Note & Weekly Progress Note  Patient Details  Name: [name] MRN: [id number] Date of Birth: [birth year]  Today's Date: 12/22/2012 Time: 0920-1000 Time Calculation (min): 40 min  Short Term Goals: Week 2: SLP Short Term Goal 1 (Week 2): Pt will self-monitor and correct wet vocal quality with Min verbal and question cues.  SLP Short Term Goal 2 (Week 2): Pt will perform pharyngeal strengthening exercises with Min verbal cues.  SLP Short Term Goal 3 (Week 2): Pt will utilize speech intelligibility strategies with Mod A verbal and question cues for 75% intelligibility at the conversation level SLP Short Term Goal 4 (Week 2): Pt will consume Dys.1 textures and thin liquids with min-mod assist clinician cues to follow safe swallow strategies.  Skilled Therapeutic Interventions: Skilled treatment session focused on dysphagia and self-care goals. Patient with PEG placed after procedure yesterday.  SLP facilitated session with Supervision level verbal cues to setup and perform oral care via suctioning.  SLP also facilitated session with puree trials and Mod assist verbal and visual cues to recall and perform pharyngeal strengthening exercises; Patient exhibited no overt s/s of aspiration.  RN student and professor present for session and administered medications; patient was able to recall and verbally express how he took his medications.  SLP provided Supervision level vebral cues to utilize slow intake and multiple swallows.     FIM:  Comprehension Comprehension Mode: Auditory Comprehension: 5-Follows basic conversation/direction: With no assist Expression Expression Mode: Verbal Expression: 4-Expresses basic 75 - 89% of the time/requires cueing 10 - 24% of the time. Needs helper to occlude trach/needs to repeat words. Social Interaction Social Interaction: 4-Interacts appropriately 75 - 89% of the time - Needs redirection for appropriate language or to  initiate interaction. Problem Solving Problem Solving: 4-Solves basic 75 - 89% of the time/requires cueing 10 - 24% of the time Memory Memory: 3-Recognizes or recalls 50 - 74% of the time/requires cueing 25 - 49% of the time FIM - Eating Eating Activity: 5: Needs verbal cues/supervision  Pain Pain Assessment Pain Assessment: 0-10 Pain Score:   6 Pain Type: Surgical pain Pain Location: Abdomen Pain Descriptors: Aching Pain Onset: Gradual Patients Stated Pain Goal: 3 Pain Intervention(s): RN made aware Multiple Pain Sites: No  Therapy/Group: Individual Therapy   Speech Language Pathology Weekly Progress Note  Patient Details  Name: [name] MRN: [id number] Date of Birth: April 07, 1951  Today's Date: 12/22/2012  Short Term Goals: Week 2: SLP Short Term Goal 1 (Week 2): Pt will self-monitor and correct wet vocal quality with Min verbal and question cues.  SLP Short Term Goal 1 - Progress (Week 2): Met SLP Short Term Goal 2 (Week 2): Pt will perform pharyngeal strengthening exercises with Min verbal cues.  SLP Short Term Goal 2 - Progress (Week 2): Progressing toward goal SLP Short Term Goal 3 (Week 2): Pt will utilize speech intelligibility strategies with Mod A verbal and question cues for 75% intelligibility at the conversation level SLP Short Term Goal 3 - Progress (Week 2): Met SLP Short Term Goal 4 (Week 2): Pt will consume Dys.1 textures and thin liquids with min-mod assist clinician cues to follow safe swallow strategies. SLP Short Term Goal 4 - Progress (Week 2): Met Week 3: SLP Short Term Goal 1 (Week 3): Pt will self-monitor and correct wet vocal quality with Supervision verbal and question cues.  SLP Short Term Goal 2 (Week 3): Pt will perform pharyngeal strengthening exercises with Min verbal  cues.  SLP Short Term Goal 3 (Week 3): Pt will utilize speech intelligibility strategies with Supervision verbal and question cues for 85% intelligibility at the conversation  level SLP Short Term Goal 4 (Week 3): Pt will consume Dys.2 textures and thin liquids with Min assist clinician cues to follow safe swallow strategies during a meal.  Weekly Progress Updates: Patient met 3 out of 4 short term goals this reporting period due to gains in toleration of diet with min-mod assist cues to carryover safe swallow strategies during a meal and improved self-monitoring and in turn management of secretions via suction and oral expectoration.  Patient also made gains inspeech intelligibilty and is requiring fewer cues to repeat.  Patient continues to require skilled SLP services to address carryover of safe swallow strategies and speech intelligibility strategies as well as ability to recall and perform pharyngeal strengthening exercises.  Continue plan of care.    SLP Intensity: Minumum of 1-2 x/day, 30 to 90 minutes SLP Frequency: 5 out of 7 days SLP Duration/Estimated Length of Stay: 1 week SLP Treatment/Interventions: Dysphagia/aspiration precaution training;Neuromuscular electrical stimulation;Patient/family education;Therapeutic Activities;Oral motor exercises;Speech/Language facilitation;Therapeutic Exercise;Cueing hierarchy;Cognitive remediation/compensation  Charlane Ferretti., CCC-SLP [phone]  Cerys Winget 12/22/2012, 10:37 AM

## 2012-12-22 NOTE — Progress Notes (Signed)
Patient ID: [name], male   DOB: [birth year], 62 y.o.   MRN: [id number] Subjective/Complaints: Continues with cough and congestion in throat  Still with wet voice, difficulty clearing secretions POD#1 s/p PEG, pt has been getting IVF because of inadequate po fluid intake Review of Systems  Respiratory: Positive for cough. Negative for shortness of breath.        Fast breathing  Gastrointestinal: Positive for nausea. Negative for vomiting.     Objective: Vital Signs: Blood pressure 144/90, pulse 95, temperature 99.6 F (37.6 C), temperature source Oral, resp. rate 18, height 5\' 8"  (1.727 m), weight 76.658 kg (169 lb), SpO2 95.00%.  Results for orders placed during the hospital encounter of 12/09/12 (from the past 72 hour(s))  CBC     Status: Abnormal   Collection Time    12/21/12  7:10 AM      Result Value Range   WBC 9.0  4.0 - 10.5 K/uL   RBC 4.39  4.22 - 5.81 MIL/uL   Hemoglobin 12.9 (*) 13.0 - 17.0 g/dL   HCT 78.2 (*) 95.6 - 21.3 %   MCV 86.6  78.0 - 100.0 fL   MCH 29.4  26.0 - 34.0 pg   MCHC 33.9  30.0 - 36.0 g/dL   RDW 08.6  57.8 - 46.9 %   Platelets 403 (*) 150 - 400 K/uL  PROTIME-INR     Status: None   Collection Time    12/21/12  7:10 AM      Result Value Range   Prothrombin Time 13.5  11.6 - 15.2 seconds   INR 1.04  0.00 - 1.49  GLUCOSE, CAPILLARY     Status: None   Collection Time    12/21/12  9:07 PM      Result Value Range   Glucose-Capillary 90  70 - 99 mg/dL  BASIC METABOLIC PANEL     Status: Abnormal   Collection Time    12/22/12  5:45 AM      Result Value Range   Sodium 140  135 - 145 mEq/L   Potassium 4.2  3.5 - 5.1 mEq/L   Chloride 102  96 - 112 mEq/L   CO2 30  19 - 32 mEq/L   Glucose, Bld 88  70 - 99 mg/dL   BUN 10  6 - 23 mg/dL   Creatinine, Ser 6.29  0.50 - 1.35 mg/dL   Calcium 52.8  8.4 - 41.3 mg/dL   GFR calc non Af Amer 88 (*) >90 mL/min   GFR calc Af Amer >90  >90 mL/min   Comment:            The eGFR has been calculated   using the CKD EPI equation.     This calculation has not been     validated in all clinical     situations.     eGFR's persistently     <90 mL/min signify     possible Chronic Kidney Disease.     HEENT: oromotor weakness Cardio: RRR and no murmur Resp: Rales and upper airway sounds, unlabored, breath sounds improve with cough GI: BS positive and non tender Extremity:  Pulses positive and No Edema Skin:   Intact Neuro: Alert/Oriented, Cranial Nerve Abnormalities CN 9 and 10, , Abnormal Motor 4/5 in BUE and BLE, Abnormal FMC Ataxic/ dec FMC and Dysarthric Musc/Skel:  Normal Gen NAD, hiccups   Assessment/Plan: 1. 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 Physiatrist is providing close team supervision and 24 hour management of active medical problems listed below. Physiatrist and rehab team continue to assess barriers to discharge/monitor patient progress toward functional and medical goals. FIM: FIM - Bathing Bathing Steps Patient Completed: Chest;Right Arm;Left Arm;Abdomen Bathing: 5: Supervision: Safety issues/verbal cues  FIM - Upper Body Dressing/Undressing Upper body dressing/undressing steps patient completed: Thread/unthread right sleeve of pullover shirt/dresss;Thread/unthread left sleeve of pullover shirt/dress;Put head through opening of pull over shirt/dress;Pull shirt over trunk Upper body dressing/undressing: 5: Supervision: Safety issues/verbal cues FIM - Lower Body Dressing/Undressing Lower body dressing/undressing steps patient completed: Thread/unthread right pants leg;Thread/unthread left pants leg;Pull pants up/down;Don/Doff right sock;Don/Doff left sock;Don/Doff right shoe;Don/Doff left shoe;Fasten/unfasten right shoe;Fasten/unfasten left shoe Lower body dressing/undressing: 5: Supervision: Safety issues/verbal cues  FIM - Toileting Toileting steps  completed by patient: Adjust clothing prior to toileting;Performs perineal hygiene Toileting Assistive Devices: Grab bar or rail for support Toileting: 4: Steadying assist  FIM - Diplomatic Services operational officer Devices: Therapist, music Transfers: 0-Activity did not occur  FIM - Banker Devices: Walker;Cane;Arm rests Bed/Chair Transfer: 5: Bed > Chair or W/C: Supervision (verbal cues/safety issues);4: Chair or W/C > Bed: Min A (steadying Pt. > 75%)  FIM - Locomotion: Wheelchair Distance: 150 Locomotion: Wheelchair: 5: Travels 150 ft or more: maneuvers on rugs and over door sills with supervision, cueing or coaxing FIM - Locomotion: Ambulation Locomotion: Ambulation Assistive Devices: Walker - Rolling;Cane - Straight Ambulation/Gait Assistance: 4: Min assist Locomotion: Ambulation: 4: Travels 150 ft or more with minimal assistance (Pt.>75%) (<50' with straight cane)  Comprehension Comprehension Mode: Auditory Comprehension: 5-Follows basic conversation/direction: With no assist  Expression Expression Mode: Verbal Expression: 4-Expresses basic 75 - 89% of the time/requires cueing 10 - 24% of the time. Needs helper to occlude trach/needs to repeat words.  Social Interaction Social Interaction: 4-Interacts appropriately 75 - 89% of the time - Needs redirection for appropriate language or to initiate interaction.  Problem Solving Problem Solving: 4-Solves basic 75 - 89% of the time/requires cueing 10 - 24% of the time  Memory Memory: 3-Recognizes or recalls 50 - 74% of the time/requires cueing 25 - 49% of the time  Medical Problem List and Plan:  1. DVT Prophylaxis/Anticoagulation: Pharmaceutical: Lovenox  2. Pain Management: N/A  3. Mood: Will have LCSW follow for evaluation.  4. Neuropsych: This patient is not capable of making decisions on his/her own behalf.  5. HTN: Monitor with bid checks. Continue HCTZ and Prinivil for  now. May need to re-evaluate diuretic in face of modified diet and acute renal insufficiency.  6. COPD: Continue spiriva. Prn nebs for SOB. Keep HOB elevated at 30 degrees. Encourage IS.  7. Dysphagia,Severe:. ReCheck MBS showed "safe" for thin liquid D2 diet, pt with poor fluid intake but goo po caloric intake.  I suspect that his main problem is handling thin liquids and he is pool saliva in vallecula requiring freq suctioning  Will change liquid to nectar.  Allow D2 solids ,LOS (Days) 13 A FACE TO FACE EVALUATION WAS PERFORMED  Daisuke Bailey,Keldon E 12/22/2012, 6:34 PM

## 2012-12-22 NOTE — Progress Notes (Signed)
NUTRITION FOLLOW UP  Intervention:    Start bolus feedings at 6 PM today: 50 ml Jevity 1.2, increase each feeding by 50 ml until reaching goal of 200 ml Jevity 1.2 QID to provide 960 kcals, 44 gm protein, 648 ml free water daily.  If patient requires NPO status and need to meet 100% of nutrition needs with TF, recommend goal of 400 ml Jevity 1.2 QID.   Nutrition Dx:   Inadequate oral intake related to dysphagia as evidenced by poor intake of meals. Ongoing.  Goal:   Intake to meet >90% of estimated nutrition needs. Unmet.  Monitor:   PO intake, TF initiation and tolerance, labs, weight trend.  Assessment:   S/P PEG yesterday, will be ready to use at 6 PM today. Plans to start bolus feedings and advance slowly. Patient has been receiving a PO diet (Dysphagia 2 with thin liquids) and is eating fair, consumed 75% of breakfast today and 65% of lunch today per discussion with RN. Will order bolus feedings to meet ~50% of estimated needs. Patient discussed with PA, RD to order TF.  Patient is receiving NS @ 75 ml/h x 12 hours nocturnally to ensure adequate fluid intake.   Height: Ht Readings from Last 1 Encounters:  12/09/12 5\' 8"  (1.727 m)    Weight Status:  Trending up slightly  Wt Readings from Last 1 Encounters:  12/21/12 169 lb (76.658 kg)  12/14/12  167 lb 12.8 oz (76.114 kg)  12/09/12  164 lb 14.5 oz (74.8 kg)   Re-estimated needs:  Kcal: 1850-2050  Protein: 85-100  Fluid: >1.9 L/day  Skin: no problems noted  Diet Order: Dysphagia 2 with thin liquids; Ensure Pudding PO QID with medications   Intake/Output Summary (Last 24 hours) at 12/22/12 1432 Last data filed at 12/22/12 1100  Gross per 24 hour  Intake    443 ml  Output    500 ml  Net    -57 ml    Last BM: 5/13   Labs:   Recent Labs Lab 12/16/12 0640 12/22/12 0545  NA 139 140  K 4.2 4.2  CL 102 102  CO2 31 30  BUN 23 10  CREATININE 1.18 0.91  CALCIUM 9.7 10.3  GLUCOSE 93 88    CBG (last 3)    Recent Labs  12/21/12 2107  GLUCAP 90    Scheduled Meds: . sodium chloride   Intravenous Custom  . albuterol  2 puff Inhalation BID  . antiseptic oral rinse  15 mL Mouth Rinse q12n4p  . atorvastatin  80 mg Oral q1800  . baclofen  20 mg Oral TID  . chlorhexidine  15 mL Mouth Rinse BID  . clopidogrel  75 mg Oral Q breakfast  . enoxaparin (LOVENOX) injection  40 mg Subcutaneous Q24H  . feeding supplement  1 Container Oral QID  . guaiFENesin  20 mL Oral TID AC & HS  . hydrochlorothiazide  25 mg Oral Daily  . lisinopril  10 mg Oral Daily  . neomycin-bacitracin-polymyxin   Topical Daily  . pantoprazole sodium  40 mg Per Tube Daily  . potassium chloride  20 mEq Oral Daily  . tiotropium  18 mcg Inhalation Daily    Continuous Infusions: None  Joaquin Courts, RD, LDN, CNSC Pager [phone] After Hours Pager [phone]

## 2012-12-22 NOTE — Progress Notes (Signed)
Physical Therapy Session Note  Patient Details  Name: [name] MRN: [id number] Date of Birth: 07/13/1951  Today's Date: 12/22/2012 Time: 13:32-14:30 ( )   Short Term Goals: Week 2:  PT Short Term Goal 1 (Week 2): = LTG of supervision-min A overall   Skilled Therapeutic Interventions/Progress Updates:  Tx focused on gait training with various and no devices as well as NMR for dynamic balance and therex for LE strengthening.  Pt propelled WC x150' with S for UE strengthening.  Sit<>stands with S and cues for safe hand placement.   Gait training with RW 1x180' and min-guard, challenged with changes in gait speed and obstacles in environment.  Gait x30' with no device and still Min A, though quality of gait less than with RW, with increased LE flexion with fatigue and more limp.  Instructed pt in use of SPC, demonstrated technique and performed gait training 2x30' with Min A overall, decreased pace.   Stairs x6 with 1 rails and cane with Min A and cues for sequence.   Dynamic balance kicking ball with bil LE and cane in 1 UE with up to Mod A for steadying. Pt able to recover LOB 50% of the time, but needing assist when reaching too far for ball with LE. Pt demonstrates difficulty with timing and targeting ball kick.   Nustep x73min with bil UE and LE for increased coordination and timing, level 5. Pt encouraged to target 60spm, but unable to attain due to fatigue.   Pt very fatigued at end of x.     Therapy Documentation Precautions:  Precautions Precautions: Fall Precaution Comments: dys 1 diet, thin liquids.  2 swallows, hard cough, followed by 2 swallows with each bite/sip Restrictions Weight Bearing Restrictions: No Pain: None  See FIM for current functional status  Therapy/Group: Individual Therapy  Clydene Laming, PT, DPT 12/22/2012, 1:53 PM

## 2012-12-22 NOTE — Progress Notes (Addendum)
Pt is s/p perc gastrostomy tube placement yesterday. Has some abdominal distention this am, no N/V Minimal pain/soreness at site BP 149/84  Pulse 84  Temp(Src) 97.7 F (36.5 C) (Oral)  Resp 18  Ht 5\' 8"  (1.727 m)  Wt 169 lb (76.658 kg)  BMI 25.7 kg/m2  SpO2 95%\ Abdomen: mildly distended but soft, NT G-tube intact, site clean  KUB ordered, possible distention from retained barium or mild ileus from procedure.  Should be able to begin using G-tube for TF later today  Barbee Shropshire [facility] 12/22/2012 11:43 AM   KUB looks good. No ileus. Tube appears in good position. Barium remains in colon and is likely contributing to abd fullness. Once he moves bowel and barium passes through, would expect fullness to subside.  Otherwise ok to begin using G-tube for TF. Recommend start at low rate or small bolus and increase as tolerated to Dietary recs.  1:47 PM

## 2012-12-23 ENCOUNTER — Inpatient Hospital Stay (HOSPITAL_COMMUNITY): Payer: Medicaid Other | Admitting: Occupational Therapy

## 2012-12-23 ENCOUNTER — Inpatient Hospital Stay (HOSPITAL_COMMUNITY): Payer: Medicaid Other | Admitting: Physical Therapy

## 2012-12-23 ENCOUNTER — Inpatient Hospital Stay (HOSPITAL_COMMUNITY): Payer: Medicaid Other | Admitting: Speech Pathology

## 2012-12-23 DIAGNOSIS — I69922 Dysarthria following unspecified cerebrovascular disease: Secondary | ICD-10-CM

## 2012-12-23 DIAGNOSIS — I69921 Dysphasia following unspecified cerebrovascular disease: Secondary | ICD-10-CM

## 2012-12-23 DIAGNOSIS — I69991 Dysphagia following unspecified cerebrovascular disease: Secondary | ICD-10-CM

## 2012-12-23 DIAGNOSIS — I6330 Cerebral infarction due to thrombosis of unspecified cerebral artery: Secondary | ICD-10-CM

## 2012-12-23 MED ORDER — FREE WATER
150.0000 mL | Freq: Every day | Status: DC
  Administered 2012-12-23 – 2013-01-02 (×51): 150 mL

## 2012-12-23 NOTE — Progress Notes (Signed)
Physical Therapy Session Note  Patient Details  Name: [name] MRN: [id number] Date of Birth: May 23, 1951  Today's Date: 12/23/2012 Time: 1130-1200 Time Calculation (min): 30 min     Skilled Therapeutic Interventions/Progress Updates:    Pt seen for gait training with 2ww with min assist and mod vcs to improve navigating turns and changing directions for 230 and 165 feet with pt displaying mild fatigue, gait training without AD in hall with use of handrail 60 feet x 2 and min to mod assist with mod vcs, pt displayed LOB with turning requiring therapist assist to recover. Pt required mod vcs to slow down as pt tends to demonstrate some compulsive movements, increased LOB noted with increased pace. Pt also performed dynamic balance activities including reaching ipsilaterally with increased difficulty noted to the R as pt tends to display right lateral lean, pt also performed dynamic balance in parallel bars with use of foam for uneven surface without use of B UEs with various BOS and B UE positioning with eyes open and eyes closed with pt displaying greater LOB with narrow BOS and eyes closed demonstrating increased LOB to the right.  Therapy Documentation Precautions:  Precautions Precautions: Fall Precaution Comments: dys 1 diet, thin liquids.  2 swallows, hard cough, followed by 2 swallows with each bite/sip Restrictions Weight Bearing Restrictions: No    :   Pain: Pain Assessment Pain Assessment: No/denies pain   See FIM for current functional status  Therapy/Group: Individual Therapy  Jackelyn Knife 12/23/2012, 12:13 PM

## 2012-12-23 NOTE — Progress Notes (Signed)
Speech Language Pathology Daily Session Note  Patient Details  Name: [name] MRN: [id number] Date of Birth: 1950-11-15  Today's Date: 12/23/2012 Time: 9562-1308 Time Calculation (min): 30 min  Short Term Goals: Week 3: SLP Short Term Goal 1 (Week 3): Pt will self-monitor and correct wet vocal quality with Supervision verbal and question cues.  SLP Short Term Goal 2 (Week 3): Pt will perform pharyngeal strengthening exercises with Min verbal cues.  SLP Short Term Goal 3 (Week 3): Pt will utilize speech intelligibility strategies with Supervision verbal and question cues for 85% intelligibility at the conversation level SLP Short Term Goal 4 (Week 3): Pt will consume Dys.2 textures and thin liquids with Min assist clinician cues to follow safe swallow strategies during a meal.  Skilled Therapeutic Interventions: Skilled treatment session focused on dysphagia goals. Patient consumed nectar-thick liquids via teaspoon with intermittent wet vocal quality; during trials patient exhibited burping and stated that he could taste his meds that were given via PEG prior to initiitaion of session.  SLP facilitated session with Min assist verbal cues to recall and perform safe swallow compensatory strategies as well as pharyngeal strengthening exercises.     FIM:  Comprehension Comprehension Mode: Auditory Comprehension: 5-Follows basic conversation/direction: With no assist Expression Expression Mode: Verbal Expression: 4-Expresses basic 75 - 89% of the time/requires cueing 10 - 24% of the time. Needs helper to occlude trach/needs to repeat words. Social Interaction Social Interaction: 4-Interacts appropriately 75 - 89% of the time - Needs redirection for appropriate language or to initiate interaction. Problem Solving Problem Solving: 4-Solves basic 75 - 89% of the time/requires cueing 10 - 24% of the time Memory Memory: 4-Recognizes or recalls 75 - 89% of the time/requires cueing 10 - 24% of  the time FIM - Eating Eating Activity: 5: Needs verbal cues/supervision  Pain Pain Assessment Pain Assessment: 0-10 Pain Score:   4 Pain Type: Surgical pain Pain Location: Abdomen Pain Descriptors: Aching Patients Stated Pain Goal: 2 Pain Intervention(s): RN made aware Multiple Pain Sites: No  Therapy/Group: Individual Therapy  Charlane Ferretti., CCC-SLP [phone]  Alyla Pietila 12/23/2012, 10:36 AM

## 2012-12-23 NOTE — Progress Notes (Signed)
Occupational Therapy Weekly Progress Note  Patient Details  Name: [name] MRN: [id number] Date of Birth: 01/01/1951  Today's Date: 12/23/2012 Time: 1000-1045 and 4034-7425 Time Calculation (min): 45 min and 18 min  Patient has met 2 of 3 short term goals.  Pt is currently mod I for bed mobility, bed <> w/c transfers, w/c mobility in controlled environment and supervision-min A overall for dynamic standing balance and ambulation with RW, supervision overall with self-care skills. Secondary to patient's continued to need for 24/7 supervision, physical assistance and assistance with eating and PEG management patient feels that family would be unable to provide sufficient supervision and assistance and has chosen to D/C to SNF for more prolonged rehabilitation to reach higher level of functional independence prior to returning to boarding home.   Patient continues to demonstrate the following deficits: Rt sided weakness with intermittent RLE buckling, impaired motor control, timing, sequencing, muscle grading and coordination, ataxia, impaired attention and safety awareness, impaired postural control, dynamic balance, and therefore will continue to benefit from skilled OT intervention to enhance overall performance with BADL and Reduce care partner burden.  Patient progressing toward long term goals..  Plan of care revisions: D/C homemaking goals due to change to SNF placement.  OT Short Term Goals Week 2:  OT Short Term Goal 1 (Week 2): Pt. will groom self with standing at sup for 5 minutes OT Short Term Goal 1 - Progress (Week 2): Met OT Short Term Goal 2 (Week 2): Pt. will engage in toilet transfer at supervision level OT Short Term Goal 2 - Progress (Week 2): Met OT Short Term Goal 3 (Week 2): Pt will transfer to shower stall at supervision level OT Short Term Goal 3 - Progress (Week 2): Progressing toward goal Week 3:  OT Short Term Goal 1 (Week 3): Pt will transfer to shower stall at  supervision level with RW OT Short Term Goal 2 (Week 3): STG = LTGs due to remaining LOS  Skilled Therapeutic Interventions/Progress Updates:    1) Pt seen for ADL retraining with focus on functional mobility with RW, transfers, and standing balance during self-care tasks.  Pt ambulated to walk-in shower with RW with min assist and min cues for safety as pt tends to speed up with transfers and disregard RW placement.  Supervision bathing at seated level except standing to wash buttocks.  Pt completed grooming in standing with supervision and no LOB this session.  Continues to demonstrate at Rt weight shift bias with ambulation and standing activities.  Engaged in Wii bowling activity without UE support with tactile facilitation at hips to promote weight shifting through LLE.  2) Pt seen for 1:1 OT with focus on functional mobility with RW.  Pt ambulated from room to therapy gym with close supervision and min cues for attention to task.  Engaged in rebounder activity with focus on standing balance and balance reactions with quick return of ball.  [name] toss activity with supervision challenging balance reactions and weight shifting.  Pt with 1 slight LOB forward, he was able to regain balance without any assistance.  Pt missed 12 mins due to RN providing meds and feeding through PEG tube.  Therapy Documentation Precautions:  Precautions Precautions: Fall Precaution Comments: dys 1 diet, thin liquids.  2 swallows, hard cough, followed by 2 swallows with each bite/sip Restrictions Weight Bearing Restrictions: No Pain: Pain Assessment Pain Assessment: No/denies pain Pain Score:   4 Pain Type: Surgical pain Pain Location: Abdomen Pain Descriptors: Aching  Patients Stated Pain Goal: 2 Pain Intervention(s): RN made aware Multiple Pain Sites: No ADL: ADL Grooming: Supervision/safety Where Assessed-Grooming: Sitting at sink Upper Body Bathing: Supervision/safety Where Assessed-Upper Body Bathing:  Shower Lower Body Bathing: Minimal assistance Where Assessed-Lower Body Bathing: Shower Upper Body Dressing: Supervision/safety Where Assessed-Upper Body Dressing: Edge of bed Lower Body Dressing: Supervision/safety Where Assessed-Lower Body Dressing: Edge of bed Toilet Transfer: Minimal assistance Toilet Transfer Method: Proofreader: Engineer, manufacturing systems Transfer: Minimal assistance Film/video editor Method: Designer, industrial/product: Shower seat with back;Grab bars  See FIM for current functional status  Therapy/Group: Individual Therapy  Leonette Monarch 12/23/2012, 12:19 PM

## 2012-12-23 NOTE — Progress Notes (Signed)
Physical Therapy Weekly Progress Note  Patient Details  Name: [name] MRN: [id number] Date of Birth: 10-20-1950  Today's Date: 12/23/2012 Time: 8295-6213 Time Calculation (min): 55 min  Patient has made steady progress and has met 3 of 7 long term goals.  Patient is currently mod I for bed mobility, bed <> w/c transfers, w/c mobility in controlled environment and supervision-min A overall for dynamic standing balance, higher level gait training with RW and stair negotiation.  Patient underwent PEG placement Wednesday evening to supplement nutrition secondary to continued swallowing deficits.  Secondary to patient's continued to need for 24/7 supervision, physical assistance and assistance with eating and PEG management patient feels that family would be unable to provide sufficient supervision and assistance and has chosen to D/C to SNF for more prolonged rehabilitation to reach higher level of functional independence prior to returning to boarding home.    Patient continues to demonstrate the following deficits: R sided weakness with intermittent RLE buckling and genu recurvatum, impaired motor control, timing, sequencing, muscle grading and coordination, ataxia, impaired attention and safety awareness, impaired postural control, dynamic balance, gait and therefore will continue to benefit from skilled PT intervention to enhance overall performance with balance, postural control, ability to compensate for deficits, functional use of  right lower extremity, attention, awareness and coordination.  Patient progressing toward long term goals..  Plan of care revisions: Car, home ambulation, home w/c mobility and furniture transfer goals D/C secondary to SNF placement now.  PT Short Term Goals Week 2:  PT Short Term Goal 1 (Week 2): = LTG of supervision-min A overall PT Short Term Goal 1 - Progress (Week 2): Progressing toward goal  Skilled Therapeutic Interventions/Progress Updates:   Performed  higher level gait training outdoors with RW over uneven pavement x 150' x 2 with focus on safe management of RW around obstacles and attention to safety hazards in environment, head turns during gait, changes in direction to R and L, up and down curb with RW and across compliant grass with min-mod A overall and intermittent sitting rest breaks.  As patient fatigued he experienced more R lateral leaning, R scissoring, and LOB to R.  In gym performed OTAGO exercises in standing for LE strength and balance: one set x 10 reps each: heel raises, toe raises, hip flexion marches, hip ABD, hip extension, HS curls, squats with bilat UE support on // bars and min A to maintain trunk position and control.  Returned to room with ambulation with RW and min A with intermittent verbal cues to maintain wider BOS and assist with L lateral weight shifting especially during turning.    Therapy Documentation Precautions:  Precautions Precautions: Fall Precaution Comments: dys 1 diet, thin liquids.  2 swallows, hard cough, followed by 2 swallows with each bite/sip Restrictions Weight Bearing Restrictions: No Pain: Pain Assessment Pain Assessment: No/denies pain Locomotion : Ambulation Ambulation/Gait Assistance: 4: Min assist Wheelchair Mobility Distance: 150   See FIM for current functional status  Therapy/Group: Individual Therapy  [name] [facility] 12/23/2012, 2:36 PM

## 2012-12-23 NOTE — Progress Notes (Signed)
Patient ID: [name], male   DOB: Sep 13, 1950, 62 y.o.   MRN: [id number] Subjective/Complaints: Continues with cough and congestion in throat  Still with wet voice, difficulty clearing secretions POD#2 s/p PEG, pt has been getting IVF because of inadequate po fluid intake Review of Systems  Respiratory: Positive for cough. Negative for shortness of breath.        Fast breathing  Gastrointestinal: Positive for nausea. Negative for vomiting.     Objective: Vital Signs: Blood pressure 127/77, pulse 85, temperature 98.4 F (36.9 C), temperature source Oral, resp. rate 16, height 5\' 8"  (1.727 m), weight 76.658 kg (169 lb), SpO2 98.00%.  Results for orders placed during the hospital encounter of 12/09/12 (from the past 72 hour(s))  CBC     Status: Abnormal   Collection Time    12/21/12  7:10 AM      Result Value Range   WBC 9.0  4.0 - 10.5 K/uL   RBC 4.39  4.22 - 5.81 MIL/uL   Hemoglobin 12.9 (*) 13.0 - 17.0 g/dL   HCT 11.9 (*) 14.7 - 82.9 %   MCV 86.6  78.0 - 100.0 fL   MCH 29.4  26.0 - 34.0 pg   MCHC 33.9  30.0 - 36.0 g/dL   RDW 56.2  13.0 - 86.5 %   Platelets 403 (*) 150 - 400 K/uL  PROTIME-INR     Status: None   Collection Time    12/21/12  7:10 AM      Result Value Range   Prothrombin Time 13.5  11.6 - 15.2 seconds   INR 1.04  0.00 - 1.49  GLUCOSE, CAPILLARY     Status: None   Collection Time    12/21/12  9:07 PM      Result Value Range   Glucose-Capillary 90  70 - 99 mg/dL  BASIC METABOLIC PANEL     Status: Abnormal   Collection Time    12/22/12  5:45 AM      Result Value Range   Sodium 140  135 - 145 mEq/L   Potassium 4.2  3.5 - 5.1 mEq/L   Chloride 102  96 - 112 mEq/L   CO2 30  19 - 32 mEq/L   Glucose, Bld 88  70 - 99 mg/dL   BUN 10  6 - 23 mg/dL   Creatinine, Ser 7.84  0.50 - 1.35 mg/dL   Calcium 69.6  8.4 - 29.5 mg/dL   GFR calc non Af Amer 88 (*) >90 mL/min   GFR calc Af Amer >90  >90 mL/min   Comment:            The eGFR has been calculated   using the CKD EPI equation.     This calculation has not been     validated in all clinical     situations.     eGFR's persistently     <90 mL/min signify     possible Chronic Kidney Disease.     HEENT: oromotor weakness Cardio: RRR and no murmur Resp: Rales and upper airway sounds, unlabored, breath sounds improve with cough GI: BS positive and non tender Extremity:  Pulses positive and No Edema Skin:   Intact Neuro: Alert/Oriented, Cranial Nerve Abnormalities CN 9 and 10, , Abnormal Motor 4/5 in BUE and BLE, Abnormal FMC Ataxic/ dec FMC and Dysarthric Musc/Skel:  Normal Gen NAD, hiccups   Assessment/Plan: 1. 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 Physiatrist is providing close team supervision and 24 hour management of active medical problems listed below. Physiatrist and rehab team continue to assess barriers to discharge/monitor patient progress toward functional and medical goals. FIM: FIM - Bathing Bathing Steps Patient Completed: Chest;Right Arm;Left Arm;Abdomen;Front perineal area;Buttocks;Right upper leg;Left upper leg;Right lower leg (including foot);Left lower leg (including foot) Bathing: 5: Supervision: Safety issues/verbal cues  FIM - Upper Body Dressing/Undressing Upper body dressing/undressing steps patient completed: Thread/unthread right sleeve of pullover shirt/dresss;Thread/unthread left sleeve of pullover shirt/dress;Put head through opening of pull over shirt/dress;Pull shirt over trunk Upper body dressing/undressing: 6: More than reasonable amount of time FIM - Lower Body Dressing/Undressing Lower body dressing/undressing steps patient completed: Thread/unthread right pants leg;Thread/unthread left pants leg;Pull pants up/down;Don/Doff right sock;Don/Doff left sock;Don/Doff right shoe;Don/Doff left shoe;Fasten/unfasten right  shoe;Fasten/unfasten left shoe Lower body dressing/undressing: 5: Supervision: Safety issues/verbal cues  FIM - Toileting Toileting steps completed by patient: Adjust clothing prior to toileting;Performs perineal hygiene;Adjust clothing after toileting Toileting Assistive Devices: Grab bar or rail for support Toileting: 4: Steadying assist  FIM - Diplomatic Services operational officer Devices: Therapist, music Transfers: 0-Activity did not occur  FIM - Banker Devices: Walker;Cane;Arm rests Bed/Chair Transfer: 5: Chair or W/C > Bed: Supervision (verbal cues/safety issues);5: Bed > Chair or W/C: Supervision (verbal cues/safety issues)  FIM - Locomotion: Wheelchair Distance: 150 Locomotion: Wheelchair: 5: Travels 150 ft or more: maneuvers on rugs and over door sills with supervision, cueing or coaxing FIM - Locomotion: Ambulation Locomotion: Ambulation Assistive Devices: Designer, industrial/product Ambulation/Gait Assistance: 4: Min assist Locomotion: Ambulation: 4: Travels 150 ft or more with minimal assistance (Pt.>75%)  Comprehension Comprehension Mode: Auditory Comprehension: 5-Follows basic conversation/direction: With no assist  Expression Expression Mode: Verbal Expression: 4-Expresses basic 75 - 89% of the time/requires cueing 10 - 24% of the time. Needs helper to occlude trach/needs to repeat words.  Social Interaction Social Interaction: 4-Interacts appropriately 75 - 89% of the time - Needs redirection for appropriate language or to initiate interaction.  Problem Solving Problem Solving: 4-Solves basic 75 - 89% of the time/requires cueing 10 - 24% of the time  Memory Memory: 4-Recognizes or recalls 75 - 89% of the time/requires cueing 10 - 24% of the time  Medical Problem List and Plan:  1. DVT Prophylaxis/Anticoagulation: Pharmaceutical: Lovenox  2. Pain Management: N/A  3. Mood: Will have LCSW follow for evaluation.  4.  Neuropsych: This patient is not capable of making decisions on his/her own behalf.  5. HTN: Monitor with bid checks. Continue HCTZ and Prinivil for now. May need to re-evaluate diuretic in face of modified diet and acute renal insufficiency.  6. COPD: Continue spiriva. Prn nebs for SOB. Keep HOB elevated at 30 degrees. Encourage IS.  7. Dysphagia,Severe:. ReCheck MBS showed "safe" for thin liquid D2 diet, pt with poor fluid intake but goo po caloric intake.  I suspect that his main problem is handling thin liquids and he is pool saliva in vallecula requiring freq suctioning  Will change liquid to nectar.  Allow D2 solids  8. Severe gastroesophageal reflux. Elevate head of bed. Increase Protonix to twice a day. This is also causing some of his problem with pulmonary toilet   ,LOS (Days) 14 A FACE TO FACE EVALUATION WAS PERFORMED  Concetta Guion,Arkin E 12/23/2012, 4:12 PM

## 2012-12-24 ENCOUNTER — Inpatient Hospital Stay (HOSPITAL_COMMUNITY): Payer: Medicaid Other | Admitting: *Deleted

## 2012-12-24 DIAGNOSIS — I69991 Dysphagia following unspecified cerebrovascular disease: Secondary | ICD-10-CM

## 2012-12-24 DIAGNOSIS — I69921 Dysphasia following unspecified cerebrovascular disease: Secondary | ICD-10-CM

## 2012-12-24 DIAGNOSIS — I6330 Cerebral infarction due to thrombosis of unspecified cerebral artery: Secondary | ICD-10-CM

## 2012-12-24 DIAGNOSIS — I69922 Dysarthria following unspecified cerebrovascular disease: Secondary | ICD-10-CM

## 2012-12-24 NOTE — Progress Notes (Signed)
Physical Therapy Session Note  Patient Details  Name: [name] MRN: [id number] Date of Birth: [birth year]  Today's Date: 12/24/2012 Time:  15:00-16:00 ( )  Skilled Therapeutic Interventions/Progress Updates:  Pt participated in stride right group for gait, balance, and therex for LE strengthening.  Seated LE strengthening with 3# weights: ankle pumps, LAQ, marching Nustep x46min with bil UE/LE for strengthening and coordination, level 6 Gait training with RW 1x150' and 1x185' with Min A and no device 1x100' up to Mod A for steadying  Ball toss with rebounder with 1kG ball for dynamic balance in sitting.          Therapy Documentation Precautions:  Precautions Precautions: Fall Precaution Comments: dys 1 diet, thin liquids.  2 swallows, hard cough, followed by 2 swallows with each bite/sip Restrictions Weight Bearing Restrictions: No Pain: None   See FIM for current functional status  Therapy/Group: Group Therapy  Clydene Laming, PT, DPT  12/24/2012, 2:55 PM

## 2012-12-24 NOTE — Progress Notes (Signed)
Patient ID: [name], male   DOB: [birth year], 62 y.o.   MRN: [id number] Subjective/Complaints: Denies cough. Good appetite. No complaints this am    Review of Systems  Respiratory: Positive for cough. Negative for shortness of breath.        Fast breathing  Gastrointestinal: Positive for nausea. Negative for vomiting.     Objective: Vital Signs: Blood pressure 144/85, pulse 73, temperature 98.5 F (36.9 C), temperature source Oral, resp. rate 18, height 5\' 8"  (1.727 m), weight 76.658 kg (169 lb), SpO2 94.00%.  Results for orders placed during the hospital encounter of 12/09/12 (from the past 72 hour(s))  GLUCOSE, CAPILLARY     Status: None   Collection Time    12/21/12  9:07 PM      Result Value Range   Glucose-Capillary 90  70 - 99 mg/dL  BASIC METABOLIC PANEL     Status: Abnormal   Collection Time    12/22/12  5:45 AM      Result Value Range   Sodium 140  135 - 145 mEq/L   Potassium 4.2  3.5 - 5.1 mEq/L   Chloride 102  96 - 112 mEq/L   CO2 30  19 - 32 mEq/L   Glucose, Bld 88  70 - 99 mg/dL   BUN 10  6 - 23 mg/dL   Creatinine, Ser 6.57  0.50 - 1.35 mg/dL   Calcium 84.6  8.4 - 96.2 mg/dL   GFR calc non Af Amer 88 (*) >90 mL/min   GFR calc Af Amer >90  >90 mL/min   Comment:            The eGFR has been calculated     using the CKD EPI equation.     This calculation has not been     validated in all clinical     situations.     eGFR's persistently     <90 mL/min signify     possible Chronic Kidney Disease.     HEENT: oromotor weakness Cardio: RRR and no murmur Resp: Rales and upper airway sounds, unlabored, breath sounds improve with cough GI: BS positive and non tender Extremity:  Pulses positive and No Edema Skin:   Intact Neuro: Alert/Oriented, Cranial Nerve Abnormalities CN 9 and 10, , Abnormal Motor 4/5 in BUE and BLE, Abnormal FMC Ataxic/ dec FMC and Dysarthric Musc/Skel:  Normal Gen NAD, hiccups   Assessment/Plan: 1. Functional deficits secondary  to R cerebellar infarct which require 3+ hours per day of interdisciplinary therapy in a comprehensive inpatient rehab setting. Physiatrist is providing close team supervision and 24 hour management of active medical problems listed below. Physiatrist and rehab team continue to assess barriers to discharge/monitor patient progress toward functional and medical goals. FIM: FIM - Bathing Bathing Steps Patient Completed: Chest;Right Arm;Left Arm;Abdomen;Front perineal area;Buttocks;Right upper leg;Left upper leg;Right lower leg (including foot);Left lower leg (including foot) Bathing: 5: Supervision: Safety issues/verbal cues  FIM - Upper Body Dressing/Undressing Upper body dressing/undressing steps patient completed: Thread/unthread right sleeve of pullover shirt/dresss;Thread/unthread left sleeve of pullover shirt/dress;Put head through opening of pull over shirt/dress;Pull shirt over trunk Upper body dressing/undressing: 6: More than reasonable amount of time FIM - Lower Body Dressing/Undressing Lower body dressing/undressing steps patient completed: Thread/unthread right pants leg;Thread/unthread left pants leg;Pull pants up/down;Don/Doff right sock;Don/Doff left sock;Don/Doff right shoe;Don/Doff left shoe;Fasten/unfasten right shoe;Fasten/unfasten left shoe Lower body dressing/undressing: 5: Supervision: Safety issues/verbal cues  FIM - Toileting Toileting steps completed by patient: Adjust clothing prior to toileting;Performs  perineal hygiene;Adjust clothing after toileting Toileting Assistive Devices: Grab bar or rail for support Toileting: 4: Steadying assist  FIM - Diplomatic Services operational officer Devices: Therapist, music Transfers: 0-Activity did not occur  FIM - Banker Devices: Walker;Cane;Arm rests Bed/Chair Transfer: 5: Chair or W/C > Bed: Supervision (verbal cues/safety issues);5: Bed > Chair or W/C: Supervision (verbal  cues/safety issues)  FIM - Locomotion: Wheelchair Distance: 150 Locomotion: Wheelchair: 5: Travels 150 ft or more: maneuvers on rugs and over door sills with supervision, cueing or coaxing FIM - Locomotion: Ambulation Locomotion: Ambulation Assistive Devices: Designer, industrial/product Ambulation/Gait Assistance: 4: Min assist Locomotion: Ambulation: 4: Travels 150 ft or more with minimal assistance (Pt.>75%)  Comprehension Comprehension Mode: Auditory Comprehension: 5-Follows basic conversation/direction: With no assist  Expression Expression Mode: Verbal Expression: 4-Expresses basic 75 - 89% of the time/requires cueing 10 - 24% of the time. Needs helper to occlude trach/needs to repeat words.  Social Interaction Social Interaction: 4-Interacts appropriately 75 - 89% of the time - Needs redirection for appropriate language or to initiate interaction.  Problem Solving Problem Solving: 4-Solves basic 75 - 89% of the time/requires cueing 10 - 24% of the time  Memory Memory: 4-Recognizes or recalls 75 - 89% of the time/requires cueing 10 - 24% of the time  Medical Problem List and Plan:  1. DVT Prophylaxis/Anticoagulation: Pharmaceutical: Lovenox  2. Pain Management: N/A  3. Mood: Will have LCSW follow for evaluation.  4. Neuropsych: This patient is not capable of making decisions on his/her own behalf.  5. HTN: Monitor with bid checks. Continue HCTZ and Prinivil for now. May need to re-evaluate diuretic in face of modified diet and acute renal insufficiency.  6. COPD: Continue spiriva. Prn nebs for SOB. Keep HOB elevated at 30 degrees. Encourage IS.  7. Dysphagia,Severe:. ReCheck MBS showed "safe" for thin liquid D2 diet, pt with poor fluid intake but goo po caloric intake.  I suspect that his main problem is handling thin liquids and he is pool saliva in vallecula requiring freq suctioning  Will change liquid to nectar.  Allow D2 solids   -intake good  -need ensure that he's getting enough  fluids  -recheck bmet monday     ,LOS (Days) 15 A FACE TO FACE EVALUATION WAS PERFORMED  Haizley Cannella T 12/24/2012, 8:40 AM

## 2012-12-25 ENCOUNTER — Inpatient Hospital Stay (HOSPITAL_COMMUNITY): Payer: Medicaid Other | Admitting: Physical Therapy

## 2012-12-25 ENCOUNTER — Inpatient Hospital Stay (HOSPITAL_COMMUNITY): Payer: Medicaid Other | Admitting: *Deleted

## 2012-12-25 NOTE — Progress Notes (Signed)
Patient ID: [name], male   DOB: January 31, 1951, 62 y.o.   MRN: [id number] Subjective/Complaints: Slept well. No complaints today.     Review of Systems  Respiratory: Positive for cough. Negative for shortness of breath.        Fast breathing  Gastrointestinal: Positive for nausea. Negative for vomiting.     Objective: Vital Signs: Blood pressure 151/91, pulse 81, temperature 98 F (36.7 C), temperature source Oral, resp. rate 18, height 5\' 8"  (1.727 m), weight 76.658 kg (169 lb), SpO2 92.00%.  No results found for this or any previous visit (from the past 72 hour(s)).   HEENT: oromotor weakness Cardio: RRR and no murmur Resp: Rales and upper airway sounds, unlabored, breath sounds improve with cough GI: BS positive and non tender Extremity:  Pulses positive and No Edema Skin:   Intact Neuro: Alert/Oriented, Cranial Nerve Abnormalities CN 9 and 10, , Abnormal Motor 4/5 in BUE and BLE, Abnormal FMC Ataxic/ dec FMC and Dysarthric Musc/Skel:  Normal Gen NAD, hiccups   Assessment/Plan: 1. 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 Physiatrist is providing close team supervision and 24 hour management of active medical problems listed below. Physiatrist and rehab team continue to assess barriers to discharge/monitor patient progress toward functional and medical goals. FIM: FIM - Bathing Bathing Steps Patient Completed: Chest;Right Arm;Left Arm;Abdomen;Front perineal area;Buttocks;Right upper leg;Left upper leg;Right lower leg (including foot);Left lower leg (including foot) Bathing: 5: Supervision: Safety issues/verbal cues  FIM - Upper Body Dressing/Undressing Upper body dressing/undressing steps patient completed: Thread/unthread right sleeve of pullover shirt/dresss;Thread/unthread left sleeve of pullover shirt/dress;Put head through opening of pull over  shirt/dress;Pull shirt over trunk Upper body dressing/undressing: 6: More than reasonable amount of time FIM - Lower Body Dressing/Undressing Lower body dressing/undressing steps patient completed: Thread/unthread right pants leg;Thread/unthread left pants leg;Pull pants up/down;Don/Doff right sock;Don/Doff left sock;Don/Doff right shoe;Don/Doff left shoe;Fasten/unfasten right shoe;Fasten/unfasten left shoe Lower body dressing/undressing: 5: Supervision: Safety issues/verbal cues  FIM - Toileting Toileting steps completed by patient: Adjust clothing prior to toileting;Performs perineal hygiene;Adjust clothing after toileting Toileting Assistive Devices: Grab bar or rail for support Toileting: 4: Steadying assist  FIM - Diplomatic Services operational officer Devices: Therapist, music Transfers: 0-Activity did not occur  FIM - Banker Devices: Environmental consultant;Arm rests Bed/Chair Transfer: 5: Bed > Chair or W/C: Supervision (verbal cues/safety issues);4: Chair or W/C > Bed: Min A (steadying Pt. > 75%)  FIM - Locomotion: Wheelchair Distance: 150 Locomotion: Wheelchair: 1: Total Assistance/staff pushes wheelchair (Pt<25%) FIM - Locomotion: Ambulation Locomotion: Ambulation Assistive Devices: Other (comment);Walker - Rolling (No device) Ambulation/Gait Assistance: 4: Min assist (Mod A with no deivce) Locomotion: Ambulation: 4: Travels 150 ft or more with minimal assistance (Pt.>75%)  Comprehension Comprehension Mode: Auditory Comprehension: 5-Follows basic conversation/direction: With no assist  Expression Expression Mode: Verbal Expression: 4-Expresses basic 75 - 89% of the time/requires cueing 10 - 24% of the time. Needs helper to occlude trach/needs to repeat words.  Social Interaction Social Interaction: 4-Interacts appropriately 75 - 89% of the time - Needs redirection for appropriate language or to initiate interaction.  Problem  Solving Problem Solving: 4-Solves basic 75 - 89% of the time/requires cueing 10 - 24% of the time  Memory Memory: 4-Recognizes or recalls 75 - 89% of the time/requires cueing 10 - 24% of the time  Medical Problem List and Plan:  1. DVT Prophylaxis/Anticoagulation: Pharmaceutical: Lovenox  2. Pain Management: N/A  3. Mood: Will have LCSW follow for evaluation.  4. Neuropsych: This patient is not capable of making decisions on his/her own behalf.  5. HTN: Monitor with bid checks. Continue HCTZ and Prinivil for now--consider increasing prinivil to bid. Continue hctz as renal function permits.  6. COPD: Continue spiriva. Prn nebs for SOB. Keep HOB elevated at 30 degrees. Encourage IS.  7. Dysphagia,Severe:. ReCheck MBS showed "safe" for thin liquid D2 diet, pt with poor fluid intake but goo po caloric intake.  I suspect that his main problem is handling thin liquids and he is pool saliva in vallecula requiring freq suctioning  Will change liquid to nectar.  Allow D2 solids   -intake good  -need ensure that he's getting enough fluids  -recheck bmet monday     ,LOS (Days) 16 A FACE TO FACE EVALUATION WAS PERFORMED  Dontee Jaso T 12/25/2012, 8:55 AM

## 2012-12-25 NOTE — Progress Notes (Signed)
Physical Therapy Session Note  Patient Details  Name: [name] MRN: [id number] Date of Birth: 1951-04-09  Today's Date: 12/25/2012 Time: 0100-0145 Time Calculation (min): 45 min   Skilled Therapeutic Interventions/Progress Updates:  Patient in his room sitting in w/c ,propels himself to the gym w/o cues or assistance.Gait training with HHA on a distance of 2x 100 feet with cues to reduce scissoring and leaning to the right, min A. NuStep 10 min level 7,to increase strength coordination and reciprocal movements. Biodex,maze: 2x stationery w/o holding to rails ,1x level 12 with holding rails, 1x level 12 w/o holding rails, modA. Obstacle course focused on training in safe turns and maneuvering around objects. Stepping up on a step 2 x15 and side stepping 2 x 15.   Therapy Documentation Precautions:  Precautions Precautions: Fall Precaution Comments: dys 1 diet, thin liquids.  2 swallows, hard cough, followed by 2 swallows with each bite/sip Restrictions Weight Bearing Restrictions: No Vital Signs: Therapy Vitals Temp: 97.6 F (36.4 C) Temp src: Oral Pulse Rate: 71 Resp: 20 BP: 161/99 mmHg Patient Position, if appropriate: Lying  See FIM for current functional status  Therapy/Group: Individual Therapy  Dorna Mai 12/25/2012, 3:29 PM

## 2012-12-25 NOTE — Plan of Care (Signed)
Problem: RH BOWEL ELIMINATION Goal: RH STG MANAGE BOWEL WITH ASSISTANCE STG Manage Bowel with modified independence.  Outcome: Progressing BM x1 2 hours after sorbitol given

## 2012-12-26 ENCOUNTER — Inpatient Hospital Stay (HOSPITAL_COMMUNITY): Payer: Medicaid Other

## 2012-12-26 ENCOUNTER — Inpatient Hospital Stay (HOSPITAL_COMMUNITY): Payer: Medicaid Other | Admitting: Speech Pathology

## 2012-12-26 ENCOUNTER — Inpatient Hospital Stay (HOSPITAL_COMMUNITY): Payer: Medicaid Other | Admitting: Physical Therapy

## 2012-12-26 ENCOUNTER — Encounter (HOSPITAL_COMMUNITY): Payer: Medicaid Other

## 2012-12-26 ENCOUNTER — Inpatient Hospital Stay (HOSPITAL_COMMUNITY): Payer: Medicaid Other | Admitting: Occupational Therapy

## 2012-12-26 DIAGNOSIS — I69921 Dysphasia following unspecified cerebrovascular disease: Secondary | ICD-10-CM

## 2012-12-26 DIAGNOSIS — I69991 Dysphagia following unspecified cerebrovascular disease: Secondary | ICD-10-CM

## 2012-12-26 DIAGNOSIS — I69922 Dysarthria following unspecified cerebrovascular disease: Secondary | ICD-10-CM

## 2012-12-26 DIAGNOSIS — I6330 Cerebral infarction due to thrombosis of unspecified cerebral artery: Secondary | ICD-10-CM

## 2012-12-26 MED ORDER — TECHNETIUM TC 99M MEDRONATE IV KIT
25.0000 | PACK | Freq: Once | INTRAVENOUS | Status: AC | PRN
  Administered 2012-12-26: 25 via INTRAVENOUS

## 2012-12-26 NOTE — Progress Notes (Signed)
Speech Language Pathology Daily Session Note  Patient Details  Name: [name] MRN: [id number] Date of Birth: 08-Nov-1950  Today's Date: 12/26/2012 Time: 4098-1191 Time Calculation (min): 30 min  Short Term Goals: Week 3: SLP Short Term Goal 1 (Week 3): Pt will self-monitor and correct wet vocal quality with Supervision verbal and question cues.  SLP Short Term Goal 2 (Week 3): Pt will perform pharyngeal strengthening exercises with Min verbal cues.  SLP Short Term Goal 3 (Week 3): Pt will utilize speech intelligibility strategies with Supervision verbal and question cues for 85% intelligibility at the conversation level SLP Short Term Goal 4 (Week 3): Pt will consume Dys.2 textures and thin liquids with Min assist clinician cues to follow safe swallow strategies during a meal.  Skilled Therapeutic Interventions: Skilled treatment session focused on dysphagia goals. Patient consumed breakfast of Dys.2 and Dys.1 textures with no initial overt s/s of aspiration.  Towards end of session patient began to burp, regurgitate and orally expectorate frothy secretions that were tinted the color of breakfast foods.  Patient declined nectar-thick liquids.  SLP facilitated session with Min assist verbal cues for pacing, portion control and use of multiple swallow with each bite/sip.  Patient reported feeling more comfortable eating, which a SLP suspect's is contributing to his impulsivity.  SLP educated RN regarding waiting 30 minutes to administer water and medications via PEG due to being full from meal.  Continue with current plan of care.   FIM:  Comprehension Comprehension Mode: Auditory Comprehension: 5-Understands complex 90% of the time/Cues < 10% of the time Expression Expression Mode: Verbal Expression: 4-Expresses basic 75 - 89% of the time/requires cueing 10 - 24% of the time. Needs helper to occlude trach/needs to repeat words. Social Interaction Social Interaction: 5-Interacts  appropriately 90% of the time - Needs monitoring or encouragement for participation or interaction. Problem Solving Problem Solving: 5-Solves basic 90% of the time/requires cueing < 10% of the time Memory Memory: 5-Requires cues to use assistive device FIM - Eating Eating Activity: 5: Needs verbal cues/supervision  Pain Pain Assessment Pain Assessment: No/denies pain  Therapy/Group: Individual Therapy  Charlane Ferretti., CCC-SLP [phone]  Babette Stum 12/26/2012, 9:19 AM

## 2012-12-26 NOTE — Progress Notes (Signed)
Occupational Therapy Session Note  Patient Details  Name: [name] MRN: [id number] Date of Birth: 04/19/1951  Today's Date: 12/26/2012 Time: 4098-1191 Time Calculation (min): 57 min  Short Term Goals: Week 3:  OT Short Term Goal 1 (Week 3): Pt will transfer to shower stall at supervision level with RW OT Short Term Goal 2 (Week 3): STG = LTGs due to remaining LOS  Skilled Therapeutic Interventions/Progress Updates:    Pt seen for ADL retraining with focus on safety with mobility and carryover of education regarding safety with self-care tasks.  Pt ambulated with RW with supervision and performed walk-in shower transfer with supervision.  Pt demonstrated appropriate safety with self-care tasks.  Grooming conducted in standing with distant supervision.  Engaged in Biodex activities with random control and maze activities with pt demonstrated hesitancy to weight shift to Lt and forward.  Reaching task in standing with stocking shelves in kitchen with activity setup to promote forward weight shifting and reaching to Lt.  Wii balance activity with pt with difficulty weight shifting to follow pattern and again noted decreased weight shift to Lt, unable to correct despite verbal cues.  Therapy Documentation Precautions:  Precautions Precautions: Fall Precaution Comments: dys 1 diet, thin liquids.  2 swallows, hard cough, followed by 2 swallows with each bite/sip Restrictions Weight Bearing Restrictions: No Pain: Pain Assessment Pain Assessment: No/denies pain  See FIM for current functional status  Therapy/Group: Individual Therapy  Leonette Monarch 12/26/2012, 12:19 PM

## 2012-12-26 NOTE — Progress Notes (Signed)
Physical Therapy Session Note  Patient Details  Name: [name] MRN: [id number] Date of Birth: [birth year]  Today's Date: 12/26/2012 Time: 1300-1356 and 7829-5621 Time Calculation (min): 56 min and 32 min  Short Term Goals: Week 1:  PT Short Term Goal 1 (Week 1): Pt will perform functional transfers with min A PT Short Term Goal 1 - Progress (Week 1): Met PT Short Term Goal 2 (Week 1): Pt will gait in controlled environment min A 150' PT Short Term Goal 2 - Progress (Week 1): Progressing toward goal Week 2:  PT Short Term Goal 1 (Week 2): = LTG of supervision-min A overall PT Short Term Goal 1 - Progress (Week 2): Progressing toward goal  Therapy Documentation Precautions:  Precautions Precautions: Fall Precaution Comments: dys 1 diet, thin liquids.  2 swallows, hard cough, followed by 2 swallows with each bite/sip Restrictions Weight Bearing Restrictions: No Pain: Pain Assessment Pain Assessment: No/denies pain Locomotion : Ambulation Ambulation/Gait Assistance: 5: Supervision Continued gait training with retro ambulation x 25' x 2 reps with no UE support but min A overall with verbal cues for lateral weight shifting and full step length; combined all movements during forwards, retro and R and L lateral stepping around cones x 25' x 6 reps with no UE support but min-mod A overall for balance and sequencing.  Performed gait x 150' with RW in controlled environment with supervision and decreased incidences of RLE scissoring, decreased R buckling and LOB during head turns, in distracting environment and during changes in direction.    Wheelchair Mobility Distance: 150 mod I Other Treatments: Treatments Neuromuscular Facilitation: Right;Lower Extremity;Forced use;Activity to increase coordination;Activity to increase motor control;Activity to increase timing and sequencing;Activity to increase sustained activation;Activity to increase lateral weight shifting;Activity to increase  anterior-posterior weight shifting beginning in standing with closed chain RLE hip ABD activation and strengthening during R and L lateral stepping while pushing out against weighted BOSU ball x 25' each direction without UE support and min-mod A for balance; also performed closed chain hip ABD training with RLE standing on 4" block performing L lateral hip drops/raises; also performed RLE stance phase activation and weight shift training with partial single limb stance with L foot up on 4" block during static stance without UE support >> adding in dynamic UE movements during RLE stance >> maintaining RLE extension and stabilization during LLE toe taps forwards, laterally and retro to 4" step x 10 reps each direction with no UE support and min-mod A for balance and cues to maintain RLE extension and mirror as visual feedback for RLE and trunk position.    PM session: Patient just returned from bone scan. Continued NMR for RLE sustained activation, lateral and forward weight shift training, hip and knee control training during 10 reps each RLE forwards and lateral step ups to 4" step with bilat UE support >> 2 inch step without UE support for increased LE activation but with min A for balance, verbal cues and mirror as visual feedback for trunk and LE positioning.  Performed green theraband resisted hip ABD during 3 reps each R and L lateral stepping x 25' in squat position with focus on maintaining upright trunk posture to minimize R lateral lean, LOB and increase L lateral weight shifting with min-mod A overall.  Required intermittent sitting rest breaks secondary to RLE fatigue.  Continued gait training with RW x 150' in controlled environment with supervision with higher level challenges of vertical and horizontal head turns, sudden stops, changes  in direction, lateral and retro stepping with one episode of LOB at end of gait sequence; patient pivoting around to sit in w/c and experiences LOB posterior to sit  on bed.  Following LOB patient transferred self to w/c with no assistance.    See FIM for current functional status  Therapy/Group: Individual Therapy  [name] [facility] 12/26/2012, 2:03 PM

## 2012-12-26 NOTE — Progress Notes (Signed)
Patient ID: [name], male   DOB: 28-Mar-1951, 62 y.o.   MRN: [id number] Subjective/Complaints: Food still feels like it is "coming up" Using suction less  Review of Systems  Respiratory: Positive for cough. Negative for shortness of breath.        Fast breathing  Gastrointestinal: Positive for nausea. Negative for vomiting.     Objective: Vital Signs: Blood pressure 124/78, pulse 86, temperature 98.8 F (37.1 C), temperature source Oral, resp. rate 18, height 5\' 8"  (1.727 m), weight 76.658 kg (169 lb), SpO2 91.00%.  No results found for this or any previous visit (from the past 72 hour(s)).   HEENT: oromotor weakness Cardio: RRR and no murmur Resp: Rales and upper airway sounds, unlabored, breath sounds improve with cough GI: BS positive and non tender Extremity:  Pulses positive and No Edema Skin:   Intact Neuro: Alert/Oriented, Cranial Nerve Abnormalities CN 9 and 10, , Abnormal Motor 4/5 in BUE and BLE, Abnormal FMC Ataxic/ dec FMC and Dysarthric Musc/Skel:  Normal Gen NAD, hiccups   Assessment/Plan: 1. 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 Physiatrist is providing close team supervision and 24 hour management of active medical problems listed below. Physiatrist and rehab team continue to assess barriers to discharge/monitor patient progress toward functional and medical goals. FIM: FIM - Bathing Bathing Steps Patient Completed: Chest;Right Arm;Left Arm;Abdomen;Front perineal area;Buttocks;Right upper leg;Left upper leg;Right lower leg (including foot);Left lower leg (including foot) Bathing: 5: Supervision: Safety issues/verbal cues  FIM - Upper Body Dressing/Undressing Upper body dressing/undressing steps patient completed: Thread/unthread right sleeve of pullover shirt/dresss;Thread/unthread left sleeve of pullover shirt/dress;Put head through  opening of pull over shirt/dress;Pull shirt over trunk Upper body dressing/undressing: 6: More than reasonable amount of time FIM - Lower Body Dressing/Undressing Lower body dressing/undressing steps patient completed: Thread/unthread right pants leg;Thread/unthread left pants leg;Pull pants up/down;Don/Doff right sock;Don/Doff left sock;Don/Doff right shoe;Don/Doff left shoe;Fasten/unfasten right shoe;Fasten/unfasten left shoe Lower body dressing/undressing: 5: Supervision: Safety issues/verbal cues  FIM - Toileting Toileting steps completed by patient: Adjust clothing prior to toileting;Performs perineal hygiene;Adjust clothing after toileting Toileting Assistive Devices: Grab bar or rail for support Toileting: 4: Steadying assist  FIM - Diplomatic Services operational officer Devices: Therapist, music Transfers: 0-Activity did not occur  FIM - Banker Devices: Environmental consultant;Arm rests Bed/Chair Transfer: 5: Bed > Chair or W/C: Supervision (verbal cues/safety issues);4: Chair or W/C > Bed: Min A (steadying Pt. > 75%)  FIM - Locomotion: Wheelchair Distance: 150 Locomotion: Wheelchair: 1: Total Assistance/staff pushes wheelchair (Pt<25%) FIM - Locomotion: Ambulation Locomotion: Ambulation Assistive Devices: Other (comment);Walker - Rolling (No device) Ambulation/Gait Assistance: 4: Min assist (Mod A with no deivce) Locomotion: Ambulation: 4: Travels 150 ft or more with minimal assistance (Pt.>75%)  Comprehension Comprehension Mode: Auditory Comprehension: 5-Understands complex 90% of the time/Cues < 10% of the time  Expression Expression Mode: Verbal Expression: 5-Expresses complex 90% of the time/cues < 10% of the time  Social Interaction Social Interaction: 5-Interacts appropriately 90% of the time - Needs monitoring or encouragement for participation or interaction.  Problem Solving Problem Solving: 5-Solves basic 90% of the time/requires  cueing < 10% of the time  Memory Memory: 5-Requires cues to use assistive device  Medical Problem List and Plan:  1. DVT Prophylaxis/Anticoagulation: Pharmaceutical: Lovenox  2. Pain Management: N/A  3. Mood: Will have LCSW follow for evaluation.  4. Neuropsych: This patient is not  capable of making decisions on his/her own behalf.  5. HTN: Monitor with bid checks. Continue HCTZ and Prinivil for now--consider increasing prinivil to bid. Continue hctz as renal function permits.  6. COPD: Continue spiriva. Prn nebs for SOB. Keep HOB elevated at 30 degrees. Encourage IS.  7. Dysphagia,Severe:. ReCheck MBS showed "safe" for thin liquid D2 diet, pt with poor fluid intake but goo po caloric intake.  I suspect that his main problem is handling thin liquids and he is pool saliva in vallecula requiring freq suctioning  Will change liquid to nectar.  Allow D2 solids   -intake good  -need to  ensure that he's getting enough fluids  -recheck bmet monday   ,LOS (Days) 17 A FACE TO FACE EVALUATION WAS PERFORMED  [name],[name] 12/26/2012, 8:05 AM

## 2012-12-27 ENCOUNTER — Inpatient Hospital Stay (HOSPITAL_COMMUNITY): Payer: Medicaid Other | Admitting: Occupational Therapy

## 2012-12-27 ENCOUNTER — Inpatient Hospital Stay (HOSPITAL_COMMUNITY): Payer: Medicaid Other | Admitting: Speech Pathology

## 2012-12-27 ENCOUNTER — Inpatient Hospital Stay (HOSPITAL_COMMUNITY): Payer: Medicaid Other | Admitting: *Deleted

## 2012-12-27 MED ORDER — LISINOPRIL 10 MG PO TABS
10.0000 mg | ORAL_TABLET | Freq: Two times a day (BID) | ORAL | Status: DC
  Administered 2012-12-27 – 2013-01-02 (×12): 10 mg
  Filled 2012-12-27 (×15): qty 1

## 2012-12-27 MED ORDER — BACLOFEN 10 MG PO TABS
10.0000 mg | ORAL_TABLET | Freq: Three times a day (TID) | ORAL | Status: DC
  Administered 2012-12-27 – 2013-01-02 (×18): 10 mg
  Filled 2012-12-27 (×21): qty 1

## 2012-12-27 NOTE — Progress Notes (Signed)
Patient ID: [name], male   DOB: 1951/05/12, 62 y.o.   MRN: [id number] Subjective/Complaints: Less reflux Not using suctioning  Review of Systems  Respiratory: Positive for cough. Negative for shortness of breath.        Fast breathing  Gastrointestinal: Positive for nausea. Negative for vomiting.     Objective: Vital Signs: Blood pressure 157/91, pulse 86, temperature 98 F (36.7 C), temperature source Oral, resp. rate 18, height 5\' 8"  (1.727 m), weight 76.658 kg (169 lb), SpO2 96.00%.  No results found for this or any previous visit (from the past 72 hour(s)).   HEENT: oromotor weakness Cardio: RRR and no murmur Resp: Rales and upper airway sounds, unlabored, breath sounds improved no upper airway sounds GI: BS positive and non tender Extremity:  Pulses positive and No Edema Skin:   Intact Neuro: Alert/Oriented,  , Abnormal Motor 4/5 in BUE and BLE, Abnormal FMC Ataxic/ dec FMC and Dysarthric Musc/Skel:  Normal Gen NAD,    Assessment/Plan: 1. 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 Physiatrist is providing close team supervision and 24 hour management of active medical problems listed below. Physiatrist and rehab team continue to assess barriers to discharge/monitor patient progress toward functional and medical goals. FIM: FIM - Bathing Bathing Steps Patient Completed: Chest;Right Arm;Left Arm;Abdomen;Front perineal area;Buttocks;Right upper leg;Left upper leg;Right lower leg (including foot);Left lower leg (including foot) Bathing: 5: Supervision: Safety issues/verbal cues  FIM - Upper Body Dressing/Undressing Upper body dressing/undressing steps patient completed: Thread/unthread right sleeve of pullover shirt/dresss;Thread/unthread left sleeve of pullover shirt/dress;Put head through opening of pull over shirt/dress;Pull shirt over trunk Upper body  dressing/undressing: 6: More than reasonable amount of time FIM - Lower Body Dressing/Undressing Lower body dressing/undressing steps patient completed: Thread/unthread right pants leg;Thread/unthread left pants leg;Pull pants up/down;Don/Doff right sock;Don/Doff left sock;Don/Doff right shoe;Don/Doff left shoe;Fasten/unfasten right shoe;Fasten/unfasten left shoe;Thread/unthread right underwear leg;Thread/unthread left underwear leg;Pull underwear up/down Lower body dressing/undressing: 5: Supervision: Safety issues/verbal cues  FIM - Toileting Toileting steps completed by patient: Adjust clothing prior to toileting;Performs perineal hygiene;Adjust clothing after toileting Toileting Assistive Devices: Grab bar or rail for support Toileting: 4: Steadying assist  FIM - Diplomatic Services operational officer Devices: Therapist, music Transfers: 0-Activity did not occur  FIM - Banker Devices: Therapist, occupational: 5: Bed > Chair or W/C: Supervision (verbal cues/safety issues);5: Chair or W/C > Bed: Supervision (verbal cues/safety issues)  FIM - Locomotion: Wheelchair Distance: 150 Locomotion: Wheelchair: 6: Travels 150 ft or more, turns around, maneuvers to table, bed or toilet, negotiates 3% grade: maneuvers on rugs and over door sills independently FIM - Locomotion: Ambulation Locomotion: Ambulation Assistive Devices: Other (comment);Walker - Rolling Ambulation/Gait Assistance: 5: Supervision Locomotion: Ambulation: 5: Travels 150 ft or more with supervision/safety issues  Comprehension Comprehension Mode: Auditory Comprehension: 5-Understands complex 90% of the time/Cues < 10% of the time  Expression Expression Mode: Verbal Expression: 5-Expresses complex 90% of the time/cues < 10% of the time  Social Interaction Social Interaction: 5-Interacts appropriately 90% of the time - Needs monitoring or encouragement for participation or  interaction.  Problem Solving Problem Solving: 5-Solves basic problems: With no assist  Memory Memory: 6-More than reasonable amt of time  Medical Problem List and Plan:  1. DVT Prophylaxis/Anticoagulation: Pharmaceutical: Lovenox  2. Pain Management: N/A  3. Mood: Will have LCSW follow for evaluation.  4. Neuropsych: This patient is not capable  of making decisions on his/her own behalf.  5. HTN: Monitor with bid checks. Continue HCTZ and Prinivil for now-- increasing prinivil to bid. Continue hctz as renal function permits.  6. COPD: Continue spiriva. Prn nebs for SOB. Keep HOB elevated at 30 degrees. Encourage IS.  7. Dysphagia,Severe:. ReCheck MBS showed "safe" for thin liquid D2 diet, pt with poor fluid intake but goo po caloric intake.  I suspect that his main problem is handling thin liquids and he is pool saliva in vallecula requiring freq suctioning  Will change liquid to nectar.  Allow D2 solids   -intake good for caloric 8.  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  Pt not c/o pain in that area.  Urology to follow up       ,LOS (Days) 18 A FACE TO FACE EVALUATION WAS PERFORMED  [name],[name] 12/27/2012, 9:08 AM

## 2012-12-27 NOTE — Progress Notes (Signed)
Speech Language Pathology Daily Session Note  Patient Details  Name: [name] MRN: [id number] Date of Birth: 1951/02/17  Today's Date: 12/27/2012 Time: 7829-5621 Time Calculation (min): 40 min  Short Term Goals: Week 3: SLP Short Term Goal 1 (Week 3): Pt will self-monitor and correct wet vocal quality with Supervision verbal and question cues.  SLP Short Term Goal 2 (Week 3): Pt will perform pharyngeal strengthening exercises with Min verbal cues.  SLP Short Term Goal 3 (Week 3): Pt will utilize speech intelligibility strategies with Supervision verbal and question cues for 85% intelligibility at the conversation level SLP Short Term Goal 4 (Week 3): Pt will consume Dys.2 textures and thin liquids with Min assist clinician cues to follow safe swallow strategies during a meal.  Skilled Therapeutic Interventions: Skilled treatment session focused on dysphagia goals. Patient consumed breakfast of Dys.2 and Dys.1 textures and nectar-thick liquids with no initial overt s/s of aspiration.  Towards end of session patient began to burp, regurgitate and orally expectorate frothy secretions that were tinted the color of breakfast foods.  SLP facilitated session with Min assist verbal cues for pacing, portion control, use of multiple swallow with each bite/sip and safety awareness due to impulsivity.  SLP also facilitated session with Min assist verbal and tactile cues to perform pharyngeal strengthening exercises.  Continue with current plan of care.   FIM:  Comprehension Comprehension Mode: Auditory Comprehension: 5-Understands complex 90% of the time/Cues < 10% of the time Expression Expression Mode: Verbal Expression: 4-Expresses basic 75 - 89% of the time/requires cueing 10 - 24% of the time. Needs helper to occlude trach/needs to repeat words. Social Interaction Social Interaction: 5-Interacts appropriately 90% of the time - Needs monitoring or encouragement for participation or  interaction. Problem Solving Problem Solving: 5-Solves basic 90% of the time/requires cueing < 10% of the time Memory Memory: 5-Requires cues to use assistive device FIM - Eating Eating Activity: 5: Needs verbal cues/supervision  Pain Pain Assessment Pain Assessment: No/denies pain  Therapy/Group: Individual Therapy  Charlane Ferretti., CCC-SLP [phone]  [name] 12/27/2012, 9:19 AM

## 2012-12-27 NOTE — Progress Notes (Signed)
NUTRITION FOLLOW UP  Intervention:    Continue diet per SLP/MD.  Recommend discontinue TF boluses, current PO intake is adequate.  Nutrition Dx:   Inadequate oral intake related to dysphagia as evidenced by poor intake of meals. Resolved.  Goal:   Intake to meet >90% of estimated nutrition needs. Met.  Monitor:   PO intake, labs, weight trend.  Assessment:   S/P PEG last week. Per RN, patient has been receiving only one bolus feeding daily due to intake of meals >/= 75%. Intake is excellent, has consumed 100% of all meals so far today, 75-100% of all meals yesterday.  TF Order: Jevity 1.2 200 ml QID (only given if intake of meal is < 50%).  Free water flushes: 150 ml 5 times daily.  Height: Ht Readings from Last 1 Encounters:  12/09/12 5\' 8"  (1.727 m)    Weight Status:  Trending up slightly  Wt Readings from Last 1 Encounters:  12/21/12 169 lb (76.658 kg)  12/14/12  167 lb 12.8 oz (76.114 kg)  12/09/12  164 lb 14.5 oz (74.8 kg)   Re-estimated needs:  Kcal: 1850-2050  Protein: 85-100  Fluid: >1.9 L/day  Skin: no problems noted  Diet Order: Dysphagia 2 with nectar thick liquids   Intake/Output Summary (Last 24 hours) at 12/27/12 1511 Last data filed at 12/27/12 1300  Gross per 24 hour  Intake   1040 ml  Output   1300 ml  Net   -260 ml    Last BM: 5/19   Labs:   Recent Labs Lab 12/22/12 0545  NA 140  K 4.2  CL 102  CO2 30  BUN 10  CREATININE 0.91  CALCIUM 10.3  GLUCOSE 88    CBG (last 3)  No results found for this basename: GLUCAP,  in the last 72 hours  Scheduled Meds: . albuterol  2 puff Inhalation BID  . antiseptic oral rinse  15 mL Mouth Rinse q12n4p  . atorvastatin  80 mg Per Tube q1800  . baclofen  10 mg Per Tube TID  . chlorhexidine  15 mL Mouth Rinse BID  . clopidogrel  75 mg Oral Q breakfast  . enoxaparin (LOVENOX) injection  40 mg Subcutaneous Q24H  . feeding supplement (JEVITY 1.2 CAL)  200 mL Per Tube QID  . free water  150  mL Per Tube 5 X Daily  . guaiFENesin  20 mL Per Tube TID AC & HS  . hydrochlorothiazide  25 mg Per Tube Daily  . lisinopril  10 mg Per Tube BID  . neomycin-bacitracin-polymyxin   Topical Daily  . pantoprazole sodium  40 mg Per Tube BID  . potassium chloride  20 mEq Per Tube Daily  . tiotropium  18 mcg Inhalation Daily    Continuous Infusions: None   Joaquin Courts, RD, LDN, CNSC Pager [phone] After Hours Pager [phone]

## 2012-12-27 NOTE — Progress Notes (Signed)
Occupational Therapy Session Note  Patient Details  Name: [name] MRN: [id number] Date of Birth: August 31, 1950  Today's Date: 12/27/2012 Time: 1015-1055 and 8295-6213 Time Calculation (min): 40 min and 45 min  Short Term Goals: Week 3:  OT Short Term Goal 1 (Week 3): Pt will transfer to shower stall at supervision level with RW OT Short Term Goal 2 (Week 3): STG = LTGs due to remaining LOS  Skilled Therapeutic Interventions/Progress Updates:    1) Pt seen for 1:1 OT with focus on functional mobility with RW and dynamic standing balance with reaching outside BOS. Engaged in overall activity tolerance with 8 mins on Nustep on resistance level 6 with pt reporting moderate level of exertion.  Dynamic standing balance with facilitation of weight shifting to Lt and forward with reaching for horse shoes and then throwing them at target.  Pt with no LOB during activity without UE support.  Required min assist when bending down to retrieve items from floor and cues for safe RW positioning and getting closer to object before reaching to increase safety.  Pt ambulated from gym to room with supervision and no LOB or knee buckling this session.  2) Pt seen for 1:1 OT with focus on balance assessment and reactions by completing Berg balance test.  Results below.  Pt continues to have difficulty with standing with feet together, tandem stance, single leg stance, and weight shifting in succession.  Results 40/56 which still moderate-severe fall risk.  Educated pt on compensation for decreased balance, ie sitting for LB bathing and dressing to compensate for decreased safety with single leg stance.  Pt reports understanding and also reports improvements in balance from initial eval.  Ambulated back to room with supervision in moderately distracting environment with no LOB.  Therapy Documentation Precautions:  Precautions Precautions: Fall Precaution Comments: dys 1 diet, thin liquids.  2 swallows, hard  cough, followed by 2 swallows with each bite/sip Restrictions Weight Bearing Restrictions: No General:   Vital Signs: Oxygen Therapy O2 Device: None (Room air) Pain: Pain Assessment Pain Assessment: No/denies pain Balance: Standardized Balance Assessment Standardized Balance Assessment: Berg Balance Test Berg Balance Test Sit to Stand: Able to stand  independently using hands Standing Unsupported: Able to stand safely 2 minutes Sitting with Back Unsupported but Feet Supported on Floor or Stool: Able to sit safely and securely 2 minutes Stand to Sit: Sits safely with minimal use of hands Transfers: Able to transfer safely, definite need of hands Standing Unsupported with Eyes Closed: Able to stand 10 seconds safely Standing Ubsupported with Feet Together: Needs help to attain position but able to stand for 30 seconds with feet together From Standing, Reach Forward with Outstretched Arm: Can reach forward >12 cm safely (5") From Standing Position, Pick up Object from Floor: Able to pick up shoe, needs supervision From Standing Position, Turn to Look Behind Over each Shoulder: Looks behind from both sides and weight shifts well Turn 360 Degrees: Able to turn 360 degrees safely but slowly Standing Unsupported, Alternately Place Feet on Step/Stool: Able to stand independently and complete 8 steps >20 seconds Standing Unsupported, One Foot in Front: Needs help to step but can hold 15 seconds Standing on One Leg: Tries to lift leg/unable to hold 3 seconds but remains standing independently Total Score: 40  See FIM for current functional status  Therapy/Group: Individual Therapy  Leonette Monarch 12/27/2012, 12:11 PM

## 2012-12-27 NOTE — Progress Notes (Signed)
Physical Therapy Session Note  Patient Details  Name: [name] MRN: [id number] Date of Birth: 1950/09/28  Today's Date: 12/27/2012 Time: 4098-1191 Time Calculation (min): 59 min  Short Term Goals: Week 2:  PT Short Term Goal 1 (Week 2): = LTG of supervision-min A overall PT Short Term Goal 1 - Progress (Week 2): Progressing toward goal  Skilled Therapeutic Interventions/Progress Updates:    Patient received sitting edge of bed. This session focused on overall  functional mobility with emphasis on safety and RW management. See details below for gait training, stair negotiation, and wheelchair mobility. Patient completed obstacle course x2 requiring him to step over 2 poles, weave in and out of 3 cones, step over a bolster, and ambulate over floor mat (compliant surface) with RW. Patient requires supervision to intermittent min guard when he demonstrates poor/unsafe RW management (picking walking up off the ground, ambulating with BOS outside of RW, or R LE outside of RW). Patient requires verbal cues for proper use and safety with RW, attention to obstacles on his R, and attention to placement of R LE. Patient performed 10 sit<>stand transfers without use of UEs with emphasis on slow, controlled descent to increase eccentric control of B quads.  Education about falls risk and indications for calling EMS vs. Attempting floor transfer. Patient able to recall contraindications for attempting floor transfer without any cues. Patient performed floor transfer standing with B UE on mat>half kneeling with B UE on mat> tall kneeling with B UE on mat>quadruped> L side sitting>L sidelying>L side sitting>quadruped> tall kneeling with B UE on mat>half kneeling with B UE on mat>standing. Patient performed floor transfer with supervision/verbal cues for proper sequencing and technique.  Patient exercised on NuStep Level 6 x9' with B UE and B LE to facilitate increased strength, endurance, and coordination  with bilateral training. Patient returned to room and left seated in wheelchair with all needs within reach.  Therapy Documentation Precautions:  Precautions Precautions: Fall Precaution Comments: dys 1 diet, thin liquids.  2 swallows, hard cough, followed by 2 swallows with each bite/sip Restrictions Weight Bearing Restrictions: No Pain: Pain Assessment Pain Assessment: No/denies pain Pain Score: 0-No pain Locomotion : Ambulation Ambulation: Yes Ambulation/Gait Assistance: 5: Supervision Ambulation Distance (Feet): 180 Feet, 150' x1, and approx 19' in home environment (carpet/negotiation of obstacles in confined spaces) Assistive device: Rolling walker Ambulation/Gait Assistance Details: Verbal cues for gait pattern;Verbal cues for precautions/safety Ambulation/Gait Assistance Details: Patient instructed in gait training 180' x1 and 150' x1 in controlled environment and ~50'x1 in home environment (on carpet, negotiation of obstacles in confined spaces). Patient requires verbal cues for attention to R LE for proper placement and attention to obstacles on R side. Gait Gait: Yes Gait Pattern: Step-through pattern;Ataxic;Narrow base of support Stairs / Additional Locomotion Stairs: Yes Stairs Assistance: 4: Min guard Stairs Assistance Details: Verbal cues for sequencing;Verbal cues for precautions/safety Stairs Assistance Details (indicate cue type and reason): Patient requires verbal cues for placement of whole foot on step. Stair Management Technique: Two rails;Alternating pattern;Step to pattern;Forwards Number of Stairs: 10 Height of Stairs: 6 Curb: 4: Min Paediatric nurse: Yes Wheelchair Assistance: 6: Modified independent (Device/Increase time) Occupational hygienist: Both upper extremities Wheelchair Parts Management: Supervision/cueing Distance: 150   See FIM for current functional status  Therapy/Group: Individual Therapy  Chipper Herb. Wylodean Shimmel, PT, DPT  12/27/2012, 3:30 PM

## 2012-12-27 NOTE — Progress Notes (Signed)
Spoke with Dr. Madilyn Hook nurse about bone scan results. She reported that patient had prostate bx 4/24 that's positive for prostate cancer. He missed follow up appointment due to hospitalization. She will have Dr. Brunilda Payor evaluate report and they will follow up with patient past discharge regarding further workup.

## 2012-12-28 ENCOUNTER — Inpatient Hospital Stay (HOSPITAL_COMMUNITY): Payer: Medicaid Other | Admitting: Physical Therapy

## 2012-12-28 ENCOUNTER — Inpatient Hospital Stay (HOSPITAL_COMMUNITY): Payer: Medicaid Other | Admitting: Occupational Therapy

## 2012-12-28 ENCOUNTER — Inpatient Hospital Stay (HOSPITAL_COMMUNITY): Payer: Medicaid Other | Admitting: Speech Pathology

## 2012-12-28 ENCOUNTER — Encounter (HOSPITAL_COMMUNITY): Payer: Medicaid Other

## 2012-12-28 NOTE — Progress Notes (Addendum)
Occupational Therapy Session Note  Patient Details  Name: [name] MRN: [id number] Date of Birth: 11/25/1950  Today's Date: 12/28/2012 Time: 7829-5621 Time Calculation (min): 45 min  Short Term Goals: Week 3:  OT Short Term Goal 1 (Week 3): Pt will transfer to shower stall at supervision level with RW OT Short Term Goal 2 (Week 3): STG = LTGs due to remaining LOS  Skilled Therapeutic Interventions/Progress Updates:    Pt seen for 1:1 OT with focus on functional mobility with and without RW and higher level ADL tasks with focus on dynamic standing balance. Pt reports need to complete laundry.  Wheeled w/c to laundry room > 150 at mod I level, completed sit to stand mod I and moved clothes from washer to dryer and loaded his clothes into washing machine and setting up washer with supervision.  Pt with no LOB in laundry room.  Engaged in dynamic balance activity with basketball shooting activity, pt required min assist to maintain balance without UE support and with quick reactions with shooting and rebounding ball, pt tends to stand with abnormally wide BOS to compensate for balance deficits.  Ambulated with and without RW to obtain ball with pt requiring close supervision when stooping to pick up ball with opposite hand on RW, but requiring min-mod assist when gathering ball without UE support.  Pt with decreased gait stride, requiring increased facilitation and steady assist without RW.  Pt's cousin present during beginning of session and very encouraging regarding pt's progress.  Therapy Documentation Precautions:  Precautions Precautions: Fall Precaution Comments: dys 1 diet, thin liquids.  2 swallows, hard cough, followed by 2 swallows with each bite/sip Restrictions Weight Bearing Restrictions: No General: General Missed Time Reason: Other (comment) (therapist running late from last pt) Vital Signs: Therapy Vitals Temp: 99 F (37.2 C) Temp src: Oral Pulse Rate: 78 Resp:  18 BP: 134/86 mmHg Patient Position, if appropriate: Sitting Pain:  Pt with no c/o pain this session.  See FIM for current functional status  Therapy/Group: Individual Therapy  Leonette Monarch 12/28/2012, 2:32 PM

## 2012-12-28 NOTE — Progress Notes (Signed)
Physical Therapy Session Note  Patient Details  Name: [name] MRN: [id number] Date of Birth: 09/07/1950  Today's Date: 12/28/2012 Time: Session #1:  4098-1191, Session #2: [id number] Time Calculation (min): Session #1: 26 min, Session #2: 17 min   Skilled Therapeutic Interventions/Progress Updates:    Session #1: This session focused on WC mobility 200' mod I indoor controlled surfaces.  Gait with RW >150' supervision and cues for foot placement inside of RW while turning over indoor and carpeted surfaces.  Stairs bil rails supervision.  Cues for safety.  Bed mobility mod I extra time needed due to strength deficits.  Pt needed safety cueing throughout session for hand placement, speed of gait and use of RW.   Session #2: This session focused again on longer distance gait for endurance training 300' with RW supervision one standing and one seated rest break needed as he fatigued.  With fatigue he listed more to the right and had difficulty progressing right leg forward.  He needed cues to slow his gait speed to compensate for his muscle fatigue.  WC mobility 300' with mod I over tiled and carpeted surfaces.  Pt reports fatigue at end of session.    Therapy Documentation Precautions:  Precautions Precautions: Fall Precaution Comments: dys 1 diet, thin liquids.  2 swallows, hard cough, followed by 2 swallows with each bite/sip Restrictions Weight Bearing Restrictions: No General:   Vital Signs: Therapy Vitals BP: 132/79 mmHg Oxygen Therapy SpO2: 95 % O2 Device: None (Room air) Pain: Pain Assessment Pain Assessment: No/denies pain    Locomotion : Ambulation Ambulation/Gait Assistance: 5: Supervision Wheelchair Mobility Distance: 200    See FIM for current functional status  Therapy/Group: Individual Therapy  Lurena Joiner B. Gorden Stthomas, PT, DPT [phone]   12/28/2012, 12:07 PM

## 2012-12-28 NOTE — Progress Notes (Signed)
Social Work Patient ID: [name], male   DOB: 06-11-1951, 62 y.o.   MRN: [id number] Met with pt to inform of tea conference progression toward goals and no bed offers as of yet.  He was hoping he was going tomorrow. Informed him once I heard something I would let him know first.  He was agreeable to this.

## 2012-12-28 NOTE — Progress Notes (Signed)
Occupational Therapy Note  Patient Details  Name: IZYK MARTY MRN: [id number] Date of Birth: January 21, 1951 Today's Date: 12/28/2012  Time: 9:30-10:27am ( .) Pt seen for 1:1 OT session with focus on transfers, BADL's, ambulation, dynamic sitting and standing balance. Pt in wheelchair upon arrival, ready to shower. Intermittent verbal & manual cues throughout session for pacing and safety with RW. Pt tends to makes impulsive movements and walk quickly, sometimes losing balance. After pt bathed and dressed, used RW to ADL apartment where pt completed dynamic standing balance activity in kitchen. Pt retrieved items out of different height cabinets, using L hand to stabilize on counter and R hand to grasp. Multiple verbal cues during activity to keep L hand on counter to prevent LOB. Pt stood at raised table in gym to assemble screws and nuts/bolts using R hand. Pt walked loop into dayroom and back to his room with RW, occasionally bumping into things on his R. No pain reported.   Aine Strycharz Hessie Diener 12/28/2012, 11:57 AM

## 2012-12-28 NOTE — Progress Notes (Signed)
Speech Language Pathology Daily Session Note  Patient Details  Name: [name] MRN: [id number] Date of Birth: 01-10-1951  Today's Date: 12/28/2012 Time: 0830-0900 Time Calculation (min): 30 min  Short Term Goals: Week 3: SLP Short Term Goal 1 (Week 3): Pt will self-monitor and correct wet vocal quality with Supervision verbal and question cues.  SLP Short Term Goal 2 (Week 3): Pt will perform pharyngeal strengthening exercises with Min verbal cues.  SLP Short Term Goal 3 (Week 3): Pt will utilize speech intelligibility strategies with Supervision verbal and question cues for 85% intelligibility at the conversation level SLP Short Term Goal 4 (Week 3): Pt will consume Dys.2 textures and thin liquids with Min assist clinician cues to follow safe swallow strategies during a meal.  Skilled Therapeutic Interventions: Skilled treatment session focused on dysphagia goals. Patient consumed breakfast of Dys.2 and Dys.1 textures and nectar-thick liquids with cough x2 which SLP suspects was due to rate of intake; patient required Supervision level verbal cues to stop eating or drinking when coughing occured.  Towards end of session patient began to burp, regurgitate and orally expectorate frothy secretions that were tinted the color of breakfast foods.  Continue with current plan of care.    FIM:  Comprehension Comprehension Mode: Auditory Comprehension: 5-Understands complex 90% of the time/Cues < 10% of the time Expression Expression Mode: Verbal Expression: 4-Expresses basic 75 - 89% of the time/requires cueing 10 - 24% of the time. Needs helper to occlude trach/needs to repeat words. Social Interaction Social Interaction: 5-Interacts appropriately 90% of the time - Needs monitoring or encouragement for participation or interaction. Problem Solving Problem Solving: 5-Solves basic 90% of the time/requires cueing < 10% of the time Memory Memory: 5-Requires cues to use assistive device FIM -  Eating Eating Activity: 5: Needs verbal cues/supervision  Pain Pain Assessment Pain Assessment: No/denies pain Pain Score: 0-No pain  Therapy/Group: Individual Therapy  [name]., CCC-SLP [phone]  [name] 12/28/2012, 8:59 AM

## 2012-12-28 NOTE — Progress Notes (Signed)
Patient ID: [name], male   DOB: Sep 13, 1950, 62 y.o.   MRN: [id number] Subjective/Complaints: Less reflux Not using suctioning  Review of Systems  Respiratory: Positive for cough. Negative for shortness of breath.        Fast breathing  Gastrointestinal: Positive for nausea. Negative for vomiting.     Objective: Vital Signs: Blood pressure 132/79, pulse 83, temperature 97.6 F (36.4 C), temperature source Oral, resp. rate 16, height 5\' 8"  (1.727 m), weight 77.474 kg (170 lb 12.8 oz), SpO2 95.00%.  No results found for this or any previous visit (from the past 72 hour(s)).   HEENT: oromotor weakness Cardio: RRR and no murmur Resp: Rales and upper airway sounds, unlabored, breath sounds improved no upper airway sounds GI: BS positive and non tender Extremity:  Pulses positive and No Edema Skin:   Intact Neuro: Alert/Oriented,  , Abnormal Motor 4/5 in BUE and BLE, Abnormal FMC Ataxic/ dec FMC and Dysarthric Musc/Skel:  Normal Gen NAD,    Assessment/Plan: 1. 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 Physiatrist is providing close team supervision and 24 hour management of active medical problems listed below. Physiatrist and rehab team continue to assess barriers to discharge/monitor patient progress toward functional and medical goals. FIM: FIM - Bathing Bathing Steps Patient Completed: Chest;Right Arm;Left Arm;Abdomen;Front perineal area;Buttocks;Right upper leg;Left upper leg;Right lower leg (including foot);Left lower leg (including foot) Bathing: 5: Supervision: Safety issues/verbal cues  FIM - Upper Body Dressing/Undressing Upper body dressing/undressing steps patient completed: Thread/unthread right sleeve of pullover shirt/dresss;Thread/unthread left sleeve of pullover shirt/dress;Put head through opening of pull over shirt/dress;Pull shirt over  trunk Upper body dressing/undressing: 6: More than reasonable amount of time FIM - Lower Body Dressing/Undressing Lower body dressing/undressing steps patient completed: Thread/unthread right pants leg;Thread/unthread left pants leg;Pull pants up/down;Don/Doff right sock;Don/Doff left sock;Don/Doff right shoe;Don/Doff left shoe;Fasten/unfasten right shoe;Fasten/unfasten left shoe;Thread/unthread right underwear leg;Thread/unthread left underwear leg;Pull underwear up/down Lower body dressing/undressing: 5: Supervision: Safety issues/verbal cues  FIM - Toileting Toileting steps completed by patient: Adjust clothing prior to toileting;Performs perineal hygiene;Adjust clothing after toileting Toileting Assistive Devices: Grab bar or rail for support Toileting: 4: Steadying assist  FIM - Diplomatic Services operational officer Devices: Therapist, music Transfers: 0-Activity did not occur  FIM - Banker Devices: Environmental consultant;Arm rests Bed/Chair Transfer: 5: Bed > Chair or W/C: Supervision (verbal cues/safety issues);5: Chair or W/C > Bed: Supervision (verbal cues/safety issues)  FIM - Locomotion: Wheelchair Distance: 150 Locomotion: Wheelchair: 6: Travels 150 ft or more, turns around, maneuvers to table, bed or toilet, negotiates 3% grade: maneuvers on rugs and over door sills independently FIM - Locomotion: Ambulation Locomotion: Ambulation Assistive Devices: Designer, industrial/product Ambulation/Gait Assistance: 5: Supervision Locomotion: Ambulation: 5: Travels 150 ft or more with supervision/safety issues  Comprehension Comprehension Mode: Auditory Comprehension: 5-Understands complex 90% of the time/Cues < 10% of the time  Expression Expression Mode: Verbal Expression: 4-Expresses basic 75 - 89% of the time/requires cueing 10 - 24% of the time. Needs helper to occlude trach/needs to repeat words.  Social Interaction Social Interaction: 5-Interacts  appropriately 90% of the time - Needs monitoring or encouragement for participation or interaction.  Problem Solving Problem Solving: 5-Solves basic 90% of the time/requires cueing < 10% of the time  Memory Memory: 5-Requires cues to use assistive device  Medical Problem List and Plan:  1. DVT Prophylaxis/Anticoagulation: Pharmaceutical: Lovenox  2.  Pain Management: N/A  3. Mood: Will have LCSW follow for evaluation.  4. Neuropsych: This patient is not capable of making decisions on his/her own behalf.  5. HTN: Monitor with bid checks. Continue HCTZ and Prinivil for now-- increasing prinivil to bid. Continue hctz as renal function permits.  6. COPD: Continue spiriva. Prn nebs for SOB. Keep HOB elevated at 30 degrees. Encourage IS.  7. Dysphagia,Severe:. ReCheck MBS showed "safe" for thin liquid D2 diet, pt with poor fluid intake but goo po caloric intake.  I suspect that his main problem is handling thin liquids and he is pool saliva in vallecula requiring freq suctioning  Will change liquid to nectar. Start H20 protocol Allow D2 solids   -intake good for caloric 8.  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  Pt not c/o pain in that area.  Urology to follow up       ,LOS (Days) 19 A FACE TO FACE EVALUATION WAS PERFORMED  [name],Abundio E 12/28/2012, 10:57 AM

## 2012-12-28 NOTE — Progress Notes (Signed)
Inpatient RehabilitationTeam Conference and Plan of Care Update Date: 12/28/2012   Time: 10:41 AM    Patient Name: [name]      Medical Record Number: [id number]  Date of Birth: 21-Oct-1950 Sex: Male         Room/Bed: 4146/4146-01 Payor Info: Payor: MEDICAID [place] / Plan: MEDICAID [place] ACCESS / Product Type: *No Product type* /    Admitting Diagnosis: CVA, SPEECH THEP  Admit Date/Time:  12/09/2012  7:04 PM Admission Comments: No comment available   Primary Diagnosis:  Stroke, acute, embolic Principal Problem: Stroke, acute, embolic  Patient Active Problem List   Diagnosis Date Noted  . Hypertensive emergency 12/06/2012  . Stroke, acute, embolic 12/06/2012  . Malignant hypertension 12/05/2012  . Cerebral embolism with cerebral infarction 12/05/2012  . Dyslipidemia 12/05/2012  . Alcohol abuse 12/05/2012  . Hypokalemia 12/05/2012  . Claudication, intermittent 11/07/2012  . Smokes tobacco daily 10/18/2012  . History of stroke 10/14/2010  . Elevated PSA 05/25/2007  . Hypertension 05/18/2007  . COPD 05/18/2007    Expected Discharge Date: SNF  Team Members Present:    Dr. Wynn Banker, Kriste Basque [place] SW, Melanee Spry RN, [facility] Perkinson OT, Fae Pippin ST, [place] PT, Tora Duck PPS Coordinator, Rosalio Loud OT, Perlie Mayo PT, Wanda Plump PT, Bretta Bang Supervisor, Bayard Hugger Director, [employer].        Current Status/Progress Goal Weekly Team Focus  Medical   Severe reflux,pharyngeal dysphagia  Trial thins  Qd treatment   Bowel/Bladder   cont of bowel/bladder  remain continent  monitor   Swallow/Nutrition/ Hydration    Dys.2 textures and nectar-thick liquids with full staff supervision for carryover of safe swallow strategies  least restrictive p.o. intake   carryover of safe swallow strategeis; education regardign water protocol   ADL's   supervision bathing, toileting, dressing from sit to stand level, supervision transfers and functional mobility with RW,  supervision static standing - min dynamic standing balance  supervision overall, except mod I grooming and toileting  dynamic standing balance, D/C planning   Mobility   Mod I bed mobility and w/c mobility; supervision transfers, gait with RW, min A stairs and higher level balance  Supervision transfers and ambulation, min A stairs, mod I w/c mobiltiy; home ambulation, home w/c and car goals D/C secondry to SNF placement  Higher level gait and balance training, motor control, strength and coordination of RLE   Communication   min assist  supervision   increase self-monitoring and correcting   Safety/Cognition/ Behavioral Observations  Supervision   Supervision   goals met   Pain   no c/o pain  no pain  assess pain qshift and prn   Skin   skin intact; PEG site - CDI, OTA  no infection or new breakdown  assess skin qshift and prn      *See Care Plan and progress notes for long and short-term goals.  Barriers to Discharge: SNF placement pending     Possible Resolutions to Barriers:  see above    Discharge Planning/Teaching Needs:  Searching for NH bed      Team Discussion:  Needs urology f/up [prostate cancer].  Swallow problems-most calories po, but, taking fluids per PEG. Balance improving some, but, still at risk. Stable medically to d/c SNF.  Revisions to Treatment Plan:  PT/OT to qd. ST continues.   Continued Need for Acute Rehabilitation Level of Care: The patient requires daily medical management by a physician with specialized training in physical medicine and  rehabilitation for the following conditions: Daily direction of a multidisciplinary physical rehabilitation program to ensure safe treatment while eliciting the highest outcome that is of practical value to the patient.: Yes Daily medical management of patient stability for increased activity during participation in an intensive rehabilitation regime.: Yes Daily analysis of laboratory values and/or radiology reports  with any subsequent need for medication adjustment of medical intervention for : Neurological problems;Other  Brock Ra 12/28/2012, 6:30 PM

## 2012-12-29 ENCOUNTER — Inpatient Hospital Stay (HOSPITAL_COMMUNITY): Payer: Medicaid Other | Admitting: Speech Pathology

## 2012-12-29 ENCOUNTER — Inpatient Hospital Stay (HOSPITAL_COMMUNITY): Payer: Medicaid Other | Admitting: *Deleted

## 2012-12-29 ENCOUNTER — Inpatient Hospital Stay (HOSPITAL_COMMUNITY): Payer: Medicaid Other | Admitting: Occupational Therapy

## 2012-12-29 DIAGNOSIS — I69922 Dysarthria following unspecified cerebrovascular disease: Secondary | ICD-10-CM

## 2012-12-29 DIAGNOSIS — I69991 Dysphagia following unspecified cerebrovascular disease: Secondary | ICD-10-CM

## 2012-12-29 DIAGNOSIS — I69921 Dysphasia following unspecified cerebrovascular disease: Secondary | ICD-10-CM

## 2012-12-29 DIAGNOSIS — I6330 Cerebral infarction due to thrombosis of unspecified cerebral artery: Secondary | ICD-10-CM

## 2012-12-29 MED ORDER — STARCH (THICKENING) PO POWD
ORAL | Status: DC | PRN
  Filled 2012-12-29: qty 227

## 2012-12-29 NOTE — Progress Notes (Signed)
Speech Language Pathology Daily Session Note  Patient Details  Name: [name] MRN: [id number] Date of Birth: Jan 13, 1951  Today's Date: 12/29/2012 Time: 1430-1455 Time Calculation (min): 25 min  Short Term Goals: Week 3: SLP Short Term Goal 1 (Week 3): Pt will self-monitor and correct wet vocal quality with Supervision verbal and question cues.  SLP Short Term Goal 2 (Week 3): Pt will perform pharyngeal strengthening exercises with Min verbal cues.  SLP Short Term Goal 3 (Week 3): Pt will utilize speech intelligibility strategies with Supervision verbal and question cues for 85% intelligibility at the conversation level SLP Short Term Goal 4 (Week 3): Pt will consume Dys.2 textures and thin liquids with Min assist clinician cues to follow safe swallow strategies during a meal.  Skilled Therapeutic Interventions: Treatment focus on dysphagia goals.  SLP facilitated session by providing Min verbal and demonstration cues for pharyngeal strengthening exercises. Pt also reported he received graham crackers and thin liquids as a snack last night. Pt required total A to recall current diet recommendations and appropriate textures. Pt's RN notified and ordered a can of thickener for his room.    FIM:  Comprehension Comprehension Mode: Auditory Comprehension: 5-Understands complex 90% of the time/Cues < 10% of the time Expression Expression Mode: Verbal Expression: 4-Expresses basic 75 - 89% of the time/requires cueing 10 - 24% of the time. Needs helper to occlude trach/needs to repeat words. Social Interaction Social Interaction: 5-Interacts appropriately 90% of the time - Needs monitoring or encouragement for participation or interaction. Problem Solving Problem Solving: 5-Solves complex 90% of the time/cues < 10% of the time Memory Memory: 4-Recognizes or recalls 75 - 89% of the time/requires cueing 10 - 24% of the time FIM - Eating Eating Activity: 5: Needs verbal  cues/supervision  Pain Pain Assessment Pain Assessment: No/denies pain Pain Score: 0-No pain  Therapy/Group: Individual Therapy  Bettie Capistran 12/29/2012, 3:42 PM

## 2012-12-29 NOTE — Progress Notes (Signed)
Occupational Therapy Session Note  Patient Details  Name: [name] MRN: [id number] Date of Birth: 1950-12-28  Today's Date: 12/29/2012 Time: 0830-0927 Time Calculation (min): 57 min  Short Term Goals: Week 3:  OT Short Term Goal 1 (Week 3): Pt will transfer to shower stall at supervision level with RW OT Short Term Goal 2 (Week 3): STG = LTGs due to remaining LOS  Skilled Therapeutic Interventions/Progress Updates:    Pt seen for therapeutic activity with focus on functional mobility and balance reactions with narrower/more typical base of support.  Pt refused bathing and dressing this session, however willing to participate in higher level balance activities and pt requested to focus on ambulation.  Ambulated over various surfaces outside with pt with increased awareness of potential distractions and safety issues, requiring fewer cues for safety this session.  Pt reports "I think I'm doing better today".  Engaged in ball toss activity with another pt with focus on smaller BOS and balance reactions when reaching outside BOS to catch ball.  Completed reaching activity with RUE crossing midline and outside BOS to encourage weight shifting forward and to the Lt, pt with 1 LOB requiring mod assist to regain balance.  Therapy Documentation Precautions:  Precautions Precautions: Fall Precaution Comments: dys 1 diet, thin liquids.  2 swallows, hard cough, followed by 2 swallows with each bite/sip Restrictions Weight Bearing Restrictions: No General:   Vital Signs: Therapy Vitals BP: 139/89 mmHg Oxygen Therapy SpO2: 91 % O2 Device: None (Room air) Pain: Pain Assessment Pain Assessment: No/denies pain Pain Score: 0-No pain  See FIM for current functional status  Therapy/Group: Individual Therapy  Leonette Monarch 12/29/2012, 12:12 PM

## 2012-12-29 NOTE — Progress Notes (Signed)
Patient ID: [name], male   DOB: [birth year], 62 y.o.   MRN: [id number] Subjective/Complaints: Less reflux Not using suctioning No pain c/os voiding well Review of Systems  Respiratory: Positive for cough. Negative for shortness of breath.        Fast breathing  Gastrointestinal: Positive for nausea. Negative for vomiting.     Objective: Vital Signs: Blood pressure 151/92, pulse 85, temperature 98.1 F (36.7 C), temperature source Oral, resp. rate 18, height 5\' 8"  (1.727 m), weight 77.474 kg (170 lb 12.8 oz), SpO2 95.00%.  No results found for this or any previous visit (from the past 72 hour(s)).   HEENT: oromotor weakness Cardio: RRR and no murmur Resp: Rales and upper airway sounds, unlabored, breath sounds improved no upper airway sounds GI: BS positive and non tender Extremity:  Pulses positive and No Edema Skin:   Intact Neuro: Alert/Oriented,  , Abnormal Motor 4/5 in BUE and BLE, Abnormal FMC Ataxic/ dec FMC and Dysarthric Musc/Skel:  Normal, no tenderness in thoracic spine Gen NAD,    Assessment/Plan: 1. 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 Physiatrist is providing close team supervision and 24 hour management of active medical problems listed below. Physiatrist and rehab team continue to assess barriers to discharge/monitor patient progress toward functional and medical goals. FIM: FIM - Bathing Bathing Steps Patient Completed: Chest;Right Arm;Left Arm;Abdomen;Front perineal area;Buttocks;Right upper leg;Left upper leg;Right lower leg (including foot);Left lower leg (including foot) Bathing: 5: Supervision: Safety issues/verbal cues  FIM - Upper Body Dressing/Undressing Upper body dressing/undressing steps patient completed: Thread/unthread right sleeve of pullover shirt/dresss;Thread/unthread left sleeve of pullover shirt/dress;Put head through  opening of pull over shirt/dress;Pull shirt over trunk Upper body dressing/undressing: 5: Supervision: Safety issues/verbal cues FIM - Lower Body Dressing/Undressing Lower body dressing/undressing steps patient completed: Thread/unthread right underwear leg;Thread/unthread left underwear leg;Pull underwear up/down;Thread/unthread right pants leg;Thread/unthread left pants leg;Pull pants up/down;Fasten/unfasten pants;Don/Doff right sock;Don/Doff left sock;Don/Doff right shoe;Don/Doff left shoe;Fasten/unfasten right shoe;Fasten/unfasten left shoe Lower body dressing/undressing: 5: Set-up assist to: Obtain clothing (just obtained shoes/socks)  FIM - Toileting Toileting steps completed by patient: Adjust clothing prior to toileting;Performs perineal hygiene;Adjust clothing after toileting Toileting Assistive Devices: Grab bar or rail for support Toileting: 4: Steadying assist  FIM - Diplomatic Services operational officer Devices: Grab bars Toilet Transfers: 0-Activity did not occur  FIM - Banker Devices: Arm rests Bed/Chair Transfer: 6: Supine > Sit: No assist;6: Sit > Supine: No assist;5: Chair or W/C > Bed: Supervision (verbal cues/safety issues);5: Bed > Chair or W/C: Supervision (verbal cues/safety issues)  FIM - Locomotion: Wheelchair Distance: 200 Locomotion: Wheelchair: 6: Travels 150 ft or more, turns around, maneuvers to table, bed or toilet, negotiates 3% grade: maneuvers on rugs and over door sills independently FIM - Locomotion: Ambulation Locomotion: Ambulation Assistive Devices: Designer, industrial/product Ambulation/Gait Assistance: 5: Supervision Locomotion: Ambulation: 5: Travels 150 ft or more with supervision/safety issues  Comprehension Comprehension Mode: Auditory Comprehension: 5-Understands complex 90% of the time/Cues < 10% of the time  Expression Expression Mode: Verbal Expression: 4-Expresses basic 75 - 89% of the time/requires  cueing 10 - 24% of the time. Needs helper to occlude trach/needs to repeat words.  Social Interaction Social Interaction: 5-Interacts appropriately 90% of the time - Needs monitoring or encouragement for participation or interaction.  Problem Solving Problem Solving: 5-Solves complex 90% of the time/cues < 10% of the time  Memory  Memory: 5-Requires cues to use assistive device  Medical Problem List and Plan:  1. DVT Prophylaxis/Anticoagulation: Pharmaceutical: Lovenox  2. Pain Management: N/A  3. Mood: Will have LCSW follow for evaluation.  4. Neuropsych: This patient is not capable of making decisions on his/her own behalf.  5. HTN: Monitor with bid checks. Continue HCTZ and Prinivil for now-- increasing prinivil to bid. Continue hctz as renal function permits.  6. COPD: Continue spiriva. Prn nebs for SOB. Keep HOB elevated at 30 degrees. Encourage IS.  7. Dysphagia,Severe:. ReCheck MBS showed "safe" for thin liquid D2 diet, pt with poor fluid intake but goo po caloric intake.  I suspect that his main problem is handling thin liquids and he is pool saliva in vallecula requiring freq suctioning  Will change liquid to nectar. Start H20 protocol Allow D2 solids   -intake good for caloric 8.  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  Pt not c/o pain in that area.  Urology to follow up       ,LOS (Days) 20 A FACE TO FACE EVALUATION WAS PERFORMED  [name],[name] 12/29/2012, 8:34 AM

## 2012-12-29 NOTE — Progress Notes (Signed)
Physical Therapy Session Note  Patient Details  Name: [name] MRN: [id number] Date of Birth: Oct 28, 1950  Today's Date: 12/29/2012 Time: 13:02-14:00 ( )   Skilled Therapeutic Interventions/Progress Updates:  Tx focused on gait as well as static and dynamic balance.  Pt propelled WC down to lobby Mod I with parts and management.   Gait training in busy environment for increased balance challenge on various surfaces, thresholds, inclines/declines. Pt ambulates with RW and S in controlled environment x150', but needs min-guard A in community setting x150' due to decreased BOS, RLE crossing midline, and R lateral lean with multiple distractions and obstacles. Also performed gait with no device in controlled setting for increased challenge and forced use of RLE without UE compensation of RW 2x150' with MinA. Pt able to correct multiple minor LOB with safe timing.   Static balance task for increased stability with narrow BOS 2x60' with min-guard A and SLS with Min A needed 4x10 sec bil. Pt unable to perform SLS without UE assist, esp standing on LLE. Mirror for visual feedback helps.   Dynamic balance activities including side-stepping 2x40' R/L, retro walking 2x40' and tandem walking 2x20.' Pt had most difficulty with side stepping to L, retro walking with stance on R, and tandem walking due to narrow BOS. Pt needed up to max A to recover from tandem walking LOB.    Stair training 1x15 with bil rails and close S with cues for slow and controlled movements.      Therapy Documentation Precautions:  Precautions Precautions: Fall Precaution Comments: dys 1 diet, thin liquids.  2 swallows, hard cough, followed by 2 swallows with each bite/sip Restrictions Weight Bearing Restrictions: No    Vital Signs: Oxygen Therapy SpO2: 91 % O2 Device: None (Room air) Pain: None    Locomotion : Ambulation Ambulation/Gait Assistance: 5: Supervision Wheelchair Mobility Distance: 200   See  FIM for current functional status  Therapy/Group: Individual Therapy  Clydene Laming, PT, DPT  12/29/2012, 1:29 PM

## 2012-12-30 ENCOUNTER — Inpatient Hospital Stay (HOSPITAL_COMMUNITY): Payer: Medicaid Other | Admitting: Occupational Therapy

## 2012-12-30 ENCOUNTER — Inpatient Hospital Stay (HOSPITAL_COMMUNITY): Payer: Medicaid Other | Admitting: Physical Therapy

## 2012-12-30 ENCOUNTER — Inpatient Hospital Stay (HOSPITAL_COMMUNITY): Payer: Medicaid Other | Admitting: Speech Pathology

## 2012-12-30 DIAGNOSIS — I69921 Dysphasia following unspecified cerebrovascular disease: Secondary | ICD-10-CM

## 2012-12-30 DIAGNOSIS — I69991 Dysphagia following unspecified cerebrovascular disease: Secondary | ICD-10-CM

## 2012-12-30 DIAGNOSIS — I69922 Dysarthria following unspecified cerebrovascular disease: Secondary | ICD-10-CM

## 2012-12-30 DIAGNOSIS — I6330 Cerebral infarction due to thrombosis of unspecified cerebral artery: Secondary | ICD-10-CM

## 2012-12-30 NOTE — Progress Notes (Signed)
Speech Language Pathology Daily Session Note & Discharge Summary   Patient Details  Name: [name] MRN: [id number] Date of Birth: 1951/05/25  Today's Date: 12/30/2012 Time: 9147-8295 Time Calculation (min): 30 min  Short Term Goals: Week 3: SLP Short Term Goal 1 (Week 3): Pt will self-monitor and correct wet vocal quality with Supervision verbal and question cues.  SLP Short Term Goal 2 (Week 3): Pt will perform pharyngeal strengthening exercises with Min verbal cues.  SLP Short Term Goal 3 (Week 3): Pt will utilize speech intelligibility strategies with Supervision verbal and question cues for 85% intelligibility at the conversation level SLP Short Term Goal 4 (Week 3): Pt will consume Dys.2 textures and thin liquids with Min assist clinician cues to follow safe swallow strategies during a meal.  Skilled Therapeutic Interventions: Skilled treatment session focused on addressing dysphagia goals.  SLP facilitated session with handout and Min assist verbal ceus to teach back the procedures for the free water protocol.  SLP also facilitated session with Max encouragment to participate in trials of thin liquids; patietn exhibited no overt s/s of aspiration duirng consumption of 2oz of water but spit out clear frothy secreations after sips.  SLP requested patient verbalized diet restrictions and patient stated that he could eat a burger and fries becasue he did before; patient required Total assist to demonstrate mental flexibility that things are different and going home will not make him the same as before. Patient also demonstrated decreased mentla flexibility regarding current medications versus home medications.  SLP continues to recommend 24/7 Supervision upon discharge to ensure safety and patient reports that he has friends.  SLP encouraged him to have them in for education.  Patient verbalized frustration with current situation and declines the rest of his therapy session (missed 15  minutes).    FIM:  Comprehension Comprehension Mode: Auditory Comprehension: 5-Understands basic 90% of the time/requires cueing < 10% of the time Expression Expression: 4-Expresses basic 75 - 89% of the time/requires cueing 10 - 24% of the time. Needs helper to occlude trach/needs to repeat words. Social Interaction Social Interaction: 5-Interacts appropriately 90% of the time - Needs monitoring or encouragement for participation or interaction. Problem Solving Problem Solving: 5-Solves complex 90% of the time/cues < 10% of the time Memory Memory: 3-Recognizes or recalls 50 - 74% of the time/requires cueing 25 - 49% of the time FIM - Eating Eating Activity: 5: Needs verbal cues/supervision  Pain Pain Assessment Pain Assessment: No/denies pain  Therapy/Group: Individual Therapy   Speech Language Pathology Weekly Progress Note  Patient Details  Name: [name] MRN: [id number] Date of Birth: [birth year]  Today's Date: 12/30/2012  Short Term Goals: Week 3: SLP Short Term Goal 1 (Week 3): Pt will self-monitor and correct wet vocal quality with Supervision verbal and question cues.  SLP Short Term Goal 1 - Progress (Week 3): Met SLP Short Term Goal 2 (Week 3): Pt will perform pharyngeal strengthening exercises with Min verbal cues.  SLP Short Term Goal 2 - Progress (Week 3): Met SLP Short Term Goal 3 (Week 3): Pt will utilize speech intelligibility strategies with Supervision verbal and question cues for 85% intelligibility at the conversation level SLP Short Term Goal 3 - Progress (Week 3): Progressing toward goal SLP Short Term Goal 4 (Week 3): Pt will consume Dys.2 textures and thin liquids with Min assist clinician cues to follow safe swallow strategies during a meal. SLP Short Term Goal 4 - Progress (Week 3): Met Week 4: SLP  Short Term Goal 1 (Week 4): Pt will self-monitor and correct wet vocal quality with Modified Independence.  SLP Short Term Goal 2 (Week 4): Pt will  perform pharyngeal strengthening exercises with Supervision level verbal cues.  SLP Short Term Goal 3 (Week 4): Pt will utilize speech intelligibility strategies with Supervision verbal and question cues for 85% intelligibility at the conversation level SLP Short Term Goal 4 (Week 4): Pt will consume Dys.2 textures and nectar-thick liquids with Modified Independence to follow safe swallow strategies during a meal. SLP Short Term Goal 5 (Week 4): Pt will recall and follow procedures for water protocol with Min assist question cues. SLP Short Term Goal 6 (Week 4): Patient will verbalize diet restrictions with Min assist question cues.  Weekly Progress Updates: Patient met 3 out of 4 short term goals this reporting period due to gains in toleration of diet with min assist cues to carryover safe swallow strategies during a meal and improved self-monitoring and in turn management of secretions via oral expectoration.  Patient continues to require cues to slow rate of speech to increase intelligibility and continues to require skilled SLP services to address carryover of safe swallow strategies, ability to follow free water protocol procedures and increase knowledgeof diet restrictions.  It is recommended that this patient continue recieve skilled SLP servies to address these deficits, maximize functional independence and reduce burden of care prior to dischrage home with recommendation for 24/7 Supervision after discharge.     SLP Intensity: Minumum of 1-2 x/day, 30 to 90 minutes SLP Frequency: 5 out of 7 days SLP Duration/Estimated Length of Stay: 1 week SLP Treatment/Interventions: Dysphagia/aspiration precaution training;Neuromuscular electrical stimulation;Patient/family education;Therapeutic Activities;Oral motor exercises;Speech/Language facilitation;Therapeutic Exercise;Cueing hierarchy;Cognitive remediation/compensation  Charlane Ferretti., CCC-SLP [phone]  Donnia Poplaski 12/30/2012, 2:31  PM

## 2012-12-30 NOTE — Progress Notes (Signed)
Patient ID: [name], male   DOB: [birth year], 62 y.o.   MRN: [id number] Subjective/Complaints: No complaints. Denies secretions./cough  Review of Systems  Respiratory: . Negative for shortness of breath.          Gastrointestinal: negative for nausea. Negative for vomiting.     Objective: Vital Signs: Blood pressure 137/89, pulse 78, temperature 98.3 F (36.8 C), temperature source Oral, resp. rate 18, height 5\' 8"  (1.727 m), weight 77.474 kg (170 lb 12.8 oz), SpO2 96.00%.  No results found for this or any previous visit (from the past 72 hour(s)).   HEENT: oromotor weakness Cardio: RRR and no murmur Resp: Rales and upper airway sounds, unlabored, breath sounds improved no upper airway sounds GI: BS positive and non tender Extremity:  Pulses positive and No Edema Skin:   Intact Neuro: Alert/Oriented,  , Abnormal Motor 4/5 in BUE and BLE, Abnormal FMC Ataxic/ dec FMC and Dysarthric Musc/Skel:  Normal, no tenderness in thoracic spine Gen NAD,    Assessment/Plan: 1. 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 Physiatrist is providing close team supervision and 24 hour management of active medical problems listed below. Physiatrist and rehab team continue to assess barriers to discharge/monitor patient progress toward functional and medical goals. FIM: FIM - Bathing Bathing Steps Patient Completed: Chest;Right Arm;Left Arm;Abdomen;Front perineal area;Buttocks;Right upper leg;Left upper leg;Right lower leg (including foot);Left lower leg (including foot) Bathing: 5: Supervision: Safety issues/verbal cues  FIM - Upper Body Dressing/Undressing Upper body dressing/undressing steps patient completed: Thread/unthread right sleeve of pullover shirt/dresss;Thread/unthread left sleeve of pullover shirt/dress;Put head through opening of pull over shirt/dress;Pull shirt over  trunk Upper body dressing/undressing: 5: Supervision: Safety issues/verbal cues FIM - Lower Body Dressing/Undressing Lower body dressing/undressing steps patient completed: Thread/unthread right underwear leg;Thread/unthread left underwear leg;Pull underwear up/down;Thread/unthread right pants leg;Thread/unthread left pants leg;Pull pants up/down;Fasten/unfasten pants;Don/Doff right sock;Don/Doff left sock;Don/Doff right shoe;Don/Doff left shoe;Fasten/unfasten right shoe;Fasten/unfasten left shoe Lower body dressing/undressing: 5: Set-up assist to: Obtain clothing (just obtained shoes/socks)  FIM - Toileting Toileting steps completed by patient: Adjust clothing prior to toileting;Performs perineal hygiene;Adjust clothing after toileting Toileting Assistive Devices: Grab bar or rail for support Toileting: 4: Steadying assist  FIM - Diplomatic Services operational officer Devices: Therapist, music Transfers: 0-Activity did not occur  FIM - Banker Devices: Environmental consultant;Arm rests Bed/Chair Transfer: 5: Bed > Chair or W/C: Supervision (verbal cues/safety issues);5: Chair or W/C > Bed: Supervision (verbal cues/safety issues)  FIM - Locomotion: Wheelchair Distance: 200 Locomotion: Wheelchair: 6: Travels 150 ft or more, turns around, maneuvers to table, bed or toilet, negotiates 3% grade: maneuvers on rugs and over door sills independently FIM - Locomotion: Ambulation Locomotion: Ambulation Assistive Devices: Designer, industrial/product Ambulation/Gait Assistance: 5: Supervision Locomotion: Ambulation: 5: Travels 150 ft or more with supervision/safety issues  Comprehension Comprehension Mode: Auditory Comprehension: 5-Understands complex 90% of the time/Cues < 10% of the time  Expression Expression Mode: Verbal Expression: 4-Expresses basic 75 - 89% of the time/requires cueing 10 - 24% of the time. Needs helper to occlude trach/needs to repeat words.  Social  Interaction Social Interaction: 5-Interacts appropriately 90% of the time - Needs monitoring or encouragement for participation or interaction.  Problem Solving Problem Solving: 5-Solves complex 90% of the time/cues < 10% of the time  Memory Memory: 4-Recognizes or recalls 75 - 89% of the time/requires cueing 10 - 24% of the time  Medical Problem List and Plan:  1. DVT Prophylaxis/Anticoagulation: Pharmaceutical: Lovenox  2. Pain Management: N/A  3. Mood: Will have LCSW follow for evaluation.  4. Neuropsych: This patient is not capable of making decisions on his/her own behalf.  5. HTN: Monitor with bid checks. Continue HCTZ and Prinivil for now-- increasing prinivil to bid. Continue hctz as renal function permits.  6. COPD: Continue spiriva. Prn nebs for SOB. Keep HOB elevated at 30 degrees. Encourage IS.  7. Dysphagia,Severe:. ReCheck MBS showed "safe" for thin liquid D2 diet, pt with decreased fluid intake but good po caloric intake.  On nectar liquids with  H20 protocol Allow D2 solids   -will discuss with SLP regarding advancing him to thin liquids--there is a question over safety and use of appropriate techniques and precautions with thins 8.  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  Pt not c/o pain in that area.  Urology to follow up       ,LOS (Days) 21 A FACE TO FACE EVALUATION WAS PERFORMED  SWARTZ,ZACHARY T 12/30/2012, 9:15 AM

## 2012-12-30 NOTE — Progress Notes (Signed)
Social Work Patient ID: [name], male   DOB: [birth year], 62 y.o.   MRN: [id number] Have contacted several facilities and no beds available.  Spoke with team who feels pt continues to need 24 hour supervision level due to safety and  Supervising him eating.  Pt is aware of no offers at this time.  Will keep looking for a bed.

## 2012-12-30 NOTE — Progress Notes (Signed)
Physical Therapy Weekly Progress Note  Patient Details  Name: [name] MRN: [id number] Date of Birth: [birth year]  Today's Date: 12/30/2012 Time: 6213-0865 Time Calculation (min): 59 min  Patient has met 5 of 7 long term goals.  Short term goals not set for week 3 due to estimated length of stay.  Patient is currently mod I bed mobility and bed <> w/c transfers and w/c mobility on unit > 150'; requires supervision for gait with RW in controlled environment and intermittent min A for higher level challenges in community environment and supervision-min A for stair negotiation.  Patient continues to be at falls risk as indicated by more recent BERG balance assessment score of 40/56: Patient continues to plan to D/C to SNF for more long term rehabilitation to reach higher level of functional mobility independence and increased independence with eating prior to return to boarding home.  Patient continues to demonstrate the following deficits: impaired strength, motor control, timing, sequencing, coordination and grading of movement RLE, impaired dynamic balance, balance reactions, gait, increased falls risk and therefore will continue to benefit from skilled PT intervention to enhance overall performance with balance, postural control, functional use of  right lower extremity and coordination.  See Patient's Care Plan for progression toward long term goals.  Patient LTG upgraded secondary to continued progress.  Plan of care revisions: standing balance and stair goals upgraded.  Skilled Therapeutic Interventions/Progress Updates:   Performed w/c mobility mod I in controlled environment; performed stair negotiation and LE strengthening with up and down 2 full flights of stairs (12 each) with RUE support on rail and alternating sequence with min A overall with intermittent verbal cues for safety and RLE placement.  Continued LE strengthening and balance training with OTAGO LE strengthening exercises x  10 reps each standing on blue foam with bilat UE support on // bars and min  A and verbal cues for upright trunk and activation of RLE extensors and glutes to maintain LE extension and closed chain ABD for stability.  Continued OTAGO balance training with one UE support on wall rail during heel walking, toe walking, tandem walking, retro walking and L and R lateral stepping x 25' x 2 each with intermittent sitting rest breaks and verbal cues to minimize R lateral lean into wall with supervision-min A overall.  Performed figure 8 walking around 2 cones with no UE support on AD with min-mod a overall with verbal cues for increased step and stride length and R foot clearance during pivoting and intermittent assistance to maintain balance while pivoting secondary to lateral LOB to the R.  Returned to room with RW x 150' with supervision with patient performing horizontal head turns while maintaining forward momentum with no LOB when distracted.  Discussed with patient that from a mobility stand point patient is at mod I level for home D/C but will discuss with OT and SLP to see if patient would be safe with ADL and eating at mod I level for D/C home and if patient would be able safely manage PEG at home.  Will continue to discuss with team.    Therapy Documentation Precautions:  Precautions Precautions: Fall Precaution Comments: dys 1 diet, thin liquids.  2 swallows, hard cough, followed by 2 swallows with each bite/sip Restrictions Weight Bearing Restrictions: No Vital Signs: Therapy Vitals Temp: 98.3 F (36.8 C) Temp src: Oral Pulse Rate: 78 Resp: 18 BP: 137/89 mmHg Patient Position, if appropriate: Sitting Oxygen Therapy SpO2: 96 % O2 Device: None (Room  air) Pain: Pain Assessment Pain Assessment: No/denies pain Pain Score: 0-No pain Locomotion : Ambulation Ambulation/Gait Assistance: 5: Supervision Wheelchair Mobility Distance: 300   See FIM for current functional  status  Therapy/Group: Individual Therapy  [name] [facility] 12/30/2012, 11:56 AM

## 2012-12-30 NOTE — Progress Notes (Signed)
Social Work Patient ID: [name], male   DOB: Dec 02, 1950, 62 y.o.   MRN: [id number] Spoke with [facility] who reports will have an answer regarding bed offer on Monday.  Informed pt of possible offer in [place]. Await call on Monday.

## 2012-12-30 NOTE — Progress Notes (Signed)
Occupational Therapy Weekly Progress Note  Patient Details  Name: [name] MRN: [id number] Date of Birth: [birth year]  Today's Date: 12/30/2012 Time: 0830-0927 Time Calculation (min): 57 min  Patient has met 1 of 2 short term goals.  Pt is progressing towards LTGs.  Pt continues to require mod verbal cues for safety with self-care tasks secondary to quick movements and frequent LOB when reaching outside BOS and turning.  Modified goals to increase challenge with balance as pt tends to have a wide BOS, increased focus on completing self-care tasks with more typical (shoulder width) BOS and increased standing tolerance and balance during self-care tasks with fewer cues for safety.  Pt continues to wait for a bed at a SNF to allow for more long term rehabilitation to reach higher level of functional mobility and increased independence with eating prior to returning to residence at boarding house, as his family is unable to provide the supervision/cues he requires at this time.  Patient continues to demonstrate the following deficits: impaired strength, motor control, timing, sequencing, coordination and grading of movement RLE, impaired dynamic balance, balance reactions and therefore will continue to benefit from skilled OT intervention to enhance overall performance with BADL, iADL and Reduce care partner burden.  Patient progressing toward long term goals..  Plan of care revisions: modified goals to focus on increased standing during self-care tasks and balance reactions with higher level ADLs.  OT Short Term Goals Week 3:  OT Short Term Goal 1 (Week 3): Pt will transfer to shower stall at supervision level with RW OT Short Term Goal 1 - Progress (Week 3): Met OT Short Term Goal 2 (Week 3): STG = LTGs due to remaining LOS OT Short Term Goal 2 - Progress (Week 3): Progressing toward goal Week 4:  OT Short Term Goal 1 (Week 4): Pt will complete transfers and functional mobility with RW with  <25% cues for safety OT Short Term Goal 2 (Week 4): Pt will complete grooming tasks in standing with supervision with more typical BOS (shoulder width) OT Short Term Goal 3 (Week 4): Pt will complete 80% of bathing in standing without LOB   Skilled Therapeutic Interventions/Progress Updates:    Pt seen for ADL retraining with increased focus on standing balance and narrower BOS in standing during bathing and grooming tasks.  Pt completed 60% of bathing in standing with no LOB and minimal sway with bathing.  Pt heavily relied on grab bar when attempting to wash feet in standing.  Pt gathered clothing and towels from floor requiring min assist when bending to floor to obtain items and mod cues for RW placement and safer strategies for bending and reaching.  Pt completed grooming tasks in standing at sink with cues for narrower BOS with pt with no LOB.  Engaged in standing activity in therapy gym with focus on dynamic standing balance with reaching outside BOS with focus on up and to left and down to left.  Pt requiring close supervision and when increased challenge to standing on compliant surface pt required min assist, especially when reaching down to Lt.  Ambulated in hallway without AD with min assist with pt with 1 LOB, requiring max assist to correct, due to distracted by another pt in hallway and turning to watch what they were doing.  Pt returned to room with all needs in reach.  Therapy Documentation Precautions:  Precautions Precautions: Fall Precaution Comments: dys 1 diet, thin liquids.  2 swallows, hard cough, followed by 2 swallows  with each bite/sip Restrictions Weight Bearing Restrictions: No General:   Vital Signs: Therapy Vitals Temp: 98.3 F (36.8 C) Temp src: Oral Pulse Rate: 78 Resp: 18 BP: 137/89 mmHg Patient Position, if appropriate: Sitting Oxygen Therapy SpO2: 96 % Pain: Pain Assessment Pain Assessment: No/denies pain Pain Score: 0-No pain ADL: ADL Grooming:  Supervision/safety Where Assessed-Grooming: Standing at sink Upper Body Bathing: Supervision/safety Where Assessed-Upper Body Bathing: Shower Lower Body Bathing: Supervision/safety Where Assessed-Lower Body Bathing: Shower Upper Body Dressing: Supervision/safety Where Assessed-Upper Body Dressing: Edge of bed Lower Body Dressing: Supervision/safety Where Assessed-Lower Body Dressing: Edge of bed Toilet Transfer: Close supervision Toilet Transfer Method: Proofreader: Acupuncturist: Close supervision Film/video editor Method: Designer, industrial/product: Information systems manager with back;Grab bars ADL Comments: Pt continues to require mod cues for safety with mobility and cues to slow down as pt moves quickly and requires occasional assitance esepially with turns and backing up.  Pt demonstrates wide BOS in all functional tasks, focus on increased balance activities with narrower BOS to increase balance reactions in functional tasks.  See FIM for current functional status  Therapy/Group: Individual Therapy  Leonette Monarch 12/30/2012, 9:36 AM

## 2012-12-31 ENCOUNTER — Inpatient Hospital Stay (HOSPITAL_COMMUNITY): Payer: Medicaid Other | Admitting: Physical Therapy

## 2012-12-31 DIAGNOSIS — I6340 Cerebral infarction due to embolism of unspecified cerebral artery: Secondary | ICD-10-CM

## 2012-12-31 DIAGNOSIS — E785 Hyperlipidemia, unspecified: Secondary | ICD-10-CM

## 2012-12-31 NOTE — Plan of Care (Signed)
Problem: RH SAFETY Goal: RH STG ADHERE TO SAFETY PRECAUTIONS W/ASSISTANCE/DEVICE STG Adhere to Safety Precautions With supervision  Outcome: Not Progressing Pt. Does not adhere to safety plan.  Continues to get up on his own for transfers even after education regarding pt. Safety.  Problem: Consults Goal: RH STROKE PATIENT EDUCATION See Patient Education module for education specifics  Outcome: Not Progressing Pt. Does not articulate understanding of stroke prevention and basic education.

## 2012-12-31 NOTE — Progress Notes (Signed)
[name] is a 62 y.o. male 04/27/1951 [id number]  Subjective: No new complaints. No new problems. Slept well. Feeling OK.  Objective: Vital signs in last 24 hours: Temp:  [97.8 F (36.6 C)-98.3 F (36.8 C)] 97.8 F (36.6 C) (05/24 0532) Pulse Rate:  [78-97] 97 (05/24 0532) Resp:  [18] 18 (05/24 0532) BP: (127-138)/(78-89) 136/78 mmHg (05/24 0532) SpO2:  [93 %-96 %] 94 % (05/24 0532) Weight:  [166 lb 14.2 oz (75.7 kg)] 166 lb 14.2 oz (75.7 kg) (05/23 2209) Weight change:  Last BM Date: 12/29/12  Intake/Output from previous day: 05/23 0701 - 05/24 0700 In: 1590 [P.O.:840; I.V.:450; NG/GT:300] Out: 501 [Urine:501] Last cbgs: CBG (last 3)  No results found for this basename: GLUCAP,  in the last 72 hours   Physical Exam General: No apparent distress    HEENT: moist mucosa Lungs: Normal effort. Lungs clear to auscultation, no crackles or wheezes. Cardiovascular: Regular rate and rhythm, no edema Musculoskeletal:  No change from before Neurological: No new neurological deficits Wounds: N/A    Skin: clear Alert, cooperative   Lab Results: BMET    Component Value Date/Time   NA 140 12/22/2012 0545   K 4.2 12/22/2012 0545   CL 102 12/22/2012 0545   CO2 30 12/22/2012 0545   GLUCOSE 88 12/22/2012 0545   BUN 10 12/22/2012 0545   CREATININE 0.91 12/22/2012 0545   CREATININE 0.99 10/18/2012 0927   CALCIUM 10.3 12/22/2012 0545   GFRNONAA 88* 12/22/2012 0545   GFRAA >90 12/22/2012 0545   CBC    Component Value Date/Time   WBC 9.0 12/21/2012 0710   RBC 4.39 12/21/2012 0710   HGB 12.9* 12/21/2012 0710   HCT 38.0* 12/21/2012 0710   PLT 403* 12/21/2012 0710   MCV 86.6 12/21/2012 0710   MCH 29.4 12/21/2012 0710   MCHC 33.9 12/21/2012 0710   RDW 13.1 12/21/2012 0710   LYMPHSABS 2.4 12/12/2012 0510   MONOABS 1.1* 12/12/2012 0510   EOSABS 0.2 12/12/2012 0510   BASOSABS 0.0 12/12/2012 0510    Studies/Results: No results found.  Medications: I have reviewed the patient's current  medications.  Assessment/Plan:   1. DVT Prophylaxis/Anticoagulation: Pharmaceutical: Lovenox  2. Pain Management: N/A  3. Mood: Will have LCSW follow for evaluation.  4. Neuropsych: This patient is not capable of making decisions on his/her own behalf.  5. HTN: Monitor with bid checks. Continue HCTZ and Prinivil for now-- increasing prinivil to bid. Continue hctz as renal function permits.  6. COPD: Continue spiriva. Prn nebs for SOB. Keep HOB elevated at 30 degrees. Encourage IS.  7. Dysphagia,Severe:. ReCheck MBS showed "safe" for thin liquid D2 diet, pt with decreased fluid intake but good po caloric intake. On nectar liquids with H20 protocol Allow D2 solids  -will discuss with SLP regarding advancing him to thin liquids--there is a question over safety and use of appropriate techniques and precautions with thins  8. 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 Pt not c/o pain in that area. Urology f/u    Length of stay, days: 22  Sonda Primes , MD 12/31/2012, 8:06 AM

## 2012-12-31 NOTE — Progress Notes (Signed)
0900 Pt. Irritable and refuses Q.R. Belt for safety measures.  .  Pt.  States he will "get up on my own, I don't need help". Pt. Reminded of the need to call for assistance as needed for safety.  Hourly rounding to continue.

## 2012-12-31 NOTE — Progress Notes (Signed)
Physical Therapy Session Note  Patient Details  Name: [name] MRN: [id number] Date of Birth: [birth year]  Today's Date: 12/31/2012 Time: 0800-0900 Time Calculation (min): 60 min  Short Term Goals: Week 1:  PT Short Term Goal 1 (Week 1): Pt will perform functional transfers with min A PT Short Term Goal 1 - Progress (Week 1): Met PT Short Term Goal 2 (Week 1): Pt will gait in controlled environment min A 150' PT Short Term Goal 2 - Progress (Week 1): Progressing toward goal  Therapy Documentation Precautions:  Precautions: Fall Precaution Comments: dys 1 diet, thin liquids.  2 swallows, hard cough, followed by 2 swallows with each bite/sip Restrictions Weight Bearing Restrictions: No Pain: Pain Assessment: No/denies pain  Therapeutic Exercise:(15') Nu-Step x 10', Level 5, B LE's in sitting Gait Training:(15') Stairs up/down 10 with S/min-A using handrails B, Gait using RW x 200' without any noted buckling or giving way of Right LE Wheelchair management:(15') propelling w/c 2 x 200' Independently, parts management with supervision; 1x patient was going to stand without locking brakes and had to be reminded Therapeutic Activity:(15') Transfer Training sit<->stand S/Mod-I, chair<->w/c S/Independent  Patient was reluctant to have safety belt around waist but was informed that safety is still an issue due to trying to get up earlier without locking w/c brakes. NA notified.  Therapy/Group: Individual Therapy  Lillionna Nabi J 12/31/2012, 8:03 AM

## 2013-01-01 ENCOUNTER — Inpatient Hospital Stay (HOSPITAL_COMMUNITY): Payer: Medicaid Other | Admitting: Physical Therapy

## 2013-01-01 DIAGNOSIS — I10 Essential (primary) hypertension: Secondary | ICD-10-CM

## 2013-01-01 DIAGNOSIS — E876 Hypokalemia: Secondary | ICD-10-CM

## 2013-01-01 NOTE — Progress Notes (Signed)
Physical Therapy Note  Patient Details  Name: [name] MRN: [id number] Date of Birth: 1950/10/02 Today's Date: 01/01/2013  1350-1445 (55 minutes) individual Pain - no complaint of pain Focus of treatment: gait training; therapeutic exercise focused on RT LE strengthening/ activity tolerance Treatment: Gait 120 feet RW (room to gym) min to close SBA with mod stagger to right; Nustep Level 4 X 10 minutes focused on activity tolerance/RT LE strengthening; Standing with LT LE on BENCH to increase weight bearing RT LE with minimal instability RT knee extension in stance; Kinetron in standing 3 X 20 with/without UE support with min vcs to increase weight shift to right; gait 120 feet gym to room RW close SBA with no stagger to right noted.    Marshon Bangs,JIM 01/01/2013, 1:56 PM

## 2013-01-01 NOTE — Plan of Care (Signed)
Problem: RH SAFETY Goal: RH STG ADHERE TO SAFETY PRECAUTIONS W/ASSISTANCE/DEVICE STG Adhere to Safety Precautions With supervision  Outcome: Not Progressing Noncompliant with safety measures. Does not adhere to safety plan.   Problem: RH BOWEL ELIMINATION Goal: RH STG MANAGE BOWEL WITH ASSISTANCE STG Manage Bowel with modified independence.  Outcome: Not Progressing Last BM 5/22.  Refused Medication to encourage BM.   Problem: RH BLADDER ELIMINATION Goal: RH STG MANAGE BLADDER WITH ASSISTANCE STG Manage Bladder With modified independence.  Outcome: Progressing Uses urinal without difficulty.

## 2013-01-01 NOTE — Progress Notes (Signed)
[name] is a 62 y.o. male May 09, 1951 [id number]  Subjective: No new complaints. No new problems. Slept well. Feeling OK. Eating well  Objective: Vital signs in last 24 hours: Temp:  [98.2 F (36.8 C)-99 F (37.2 C)] 98.2 F (36.8 C) (05/25 0546) Pulse Rate:  [90-96] 90 (05/25 0546) Resp:  [18] 18 (05/25 0546) BP: (109-112)/(74-76) 111/76 mmHg (05/25 0546) SpO2:  [96 %] 96 % (05/24 1530) Weight change:  Last BM Date: 12/29/12 (Refused Medication)  Intake/Output from previous day: 05/24 0701 - 05/25 0700 In: 1290 [P.O.:840; NG/GT:450] Out: -  Last cbgs: CBG (last 3)  No results found for this basename: GLUCAP,  in the last 72 hours   Physical Exam General: No apparent distress    HEENT: moist mucosa Lungs: Normal effort. Lungs clear to auscultation, no crackles or wheezes. Cardiovascular: Regular rate and rhythm, no edema Musculoskeletal:  No change from before Neurological: No new neurological deficits Wounds: N/A    Skin: clear Alert, cooperative   Lab Results: BMET    Component Value Date/Time   NA 140 12/22/2012 0545   K 4.2 12/22/2012 0545   CL 102 12/22/2012 0545   CO2 30 12/22/2012 0545   GLUCOSE 88 12/22/2012 0545   BUN 10 12/22/2012 0545   CREATININE 0.91 12/22/2012 0545   CREATININE 0.99 10/18/2012 0927   CALCIUM 10.3 12/22/2012 0545   GFRNONAA 88* 12/22/2012 0545   GFRAA >90 12/22/2012 0545   CBC    Component Value Date/Time   WBC 9.0 12/21/2012 0710   RBC 4.39 12/21/2012 0710   HGB 12.9* 12/21/2012 0710   HCT 38.0* 12/21/2012 0710   PLT 403* 12/21/2012 0710   MCV 86.6 12/21/2012 0710   MCH 29.4 12/21/2012 0710   MCHC 33.9 12/21/2012 0710   RDW 13.1 12/21/2012 0710   LYMPHSABS 2.4 12/12/2012 0510   MONOABS 1.1* 12/12/2012 0510   EOSABS 0.2 12/12/2012 0510   BASOSABS 0.0 12/12/2012 0510    Studies/Results: No results found.  Medications: I have reviewed the patient's current medications.  Assessment/Plan:   1. DVT Prophylaxis/Anticoagulation:  Pharmaceutical: Lovenox  2. Pain Management: N/A  3. Mood: Will have LCSW follow for evaluation.  4. Neuropsych: This patient is not capable of making decisions on his/her own behalf.  5. HTN: Monitor with bid checks. Continue HCTZ and Prinivil for now-- increasing prinivil to bid. Continue hctz as renal function permits.  6. COPD: Continue spiriva. Prn nebs for SOB. Keep HOB elevated at 30 degrees. Encourage IS.  7. Dysphagia,Severe:. ReCheck MBS showed "safe" for thin liquid D2 diet, pt with decreased fluid intake but good po caloric intake. On nectar liquids with H20 protocol Allow D2 solids - no chocking on food -will discuss with SLP regarding advancing him to thin liquids--there is a question over safety and use of appropriate techniques and precautions with thins  8. 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 Pt not c/o pain in that area. Urology f/u    Length of stay, days: 23  Sonda Primes , MD 01/01/2013, 8:28 AM

## 2013-01-02 ENCOUNTER — Inpatient Hospital Stay (HOSPITAL_COMMUNITY): Payer: Medicaid Other | Admitting: Speech Pathology

## 2013-01-02 ENCOUNTER — Encounter (HOSPITAL_COMMUNITY): Payer: Medicaid Other | Admitting: Occupational Therapy

## 2013-01-02 ENCOUNTER — Inpatient Hospital Stay (HOSPITAL_COMMUNITY): Payer: Medicaid Other | Admitting: Physical Therapy

## 2013-01-02 MED ORDER — BISACODYL 10 MG RE SUPP: 10.0000 mg | Freq: Every day | RECTAL | Status: DC | PRN

## 2013-01-02 MED ORDER — FREE WATER: 150.0000 mL | Freq: Every day | Status: DC

## 2013-01-02 MED ORDER — BACLOFEN 10 MG PO TABS: 10.0000 mg | ORAL_TABLET | Freq: Three times a day (TID) | ORAL | Status: DC

## 2013-01-02 MED ORDER — ATORVASTATIN CALCIUM 80 MG PO TABS: 80.0000 mg | ORAL_TABLET | Freq: Every day | ORAL | Status: DC

## 2013-01-02 MED ORDER — ACETAMINOPHEN 325 MG PO TABS: 325.0000 mg | ORAL_TABLET | ORAL | Status: DC | PRN

## 2013-01-02 MED ORDER — POTASSIUM CHLORIDE 20 MEQ/15ML (10%) PO LIQD: 20.0000 meq | Freq: Every day | ORAL | Status: DC

## 2013-01-02 MED ORDER — PANTOPRAZOLE SODIUM 40 MG PO PACK: 40.0000 mg | PACK | Freq: Two times a day (BID) | ORAL | Status: DC

## 2013-01-02 NOTE — Progress Notes (Signed)
Note reviewed and accurately reflects treatment session.   

## 2013-01-02 NOTE — Progress Notes (Signed)
Social Work Patient ID: [name], male   DOB: 07-Feb-1951, 62 y.o.   MRN: [id number] [facility] bed.  Met with pt and he is agreeable and ready to go.  They can take him today. Spoke with Pam-PA who will dictate the discharge summary.  Gather paperwork and work on transfer at 1;00.  Ambulance set up for 1:00PM

## 2013-01-02 NOTE — Plan of Care (Signed)
Problem: RH KNOWLEDGE DEFICIT Goal: RH STG INCREASE KNOWLEDGE OF DYSPHAGIA/FLUID INTAKE Patient/caregiver with min asssit will be able to verbalize strategies to maintain swallowing/aspiration precautions,correct food textures, medication administration and tube feeding thru PEG tube and peg care.Patient/caregiver will return demonstrate use of PEG tube for water flushes, tube feeds and medication administration.  Outcome: Completed/Met Date Met:  01/02/13 Pt able to verbalize tube flushes and verbalize swallowing techniques and aspiration precautions. Pt being discharged to a nursing facility where peg tube care will be done by staff

## 2013-01-02 NOTE — Progress Notes (Signed)
Patient ID: [name], male   DOB: 1950/12/10, 62 y.o.   MRN: [id number] Subjective/Complaints: No problems. "I'm going tomorrow."  Review of Systems  Respiratory: . Negative for shortness of breath.          Gastrointestinal: negative for nausea. Negative for vomiting.     Objective: Vital Signs: Blood pressure 116/74, pulse 87, temperature 97.9 F (36.6 C), temperature source Oral, resp. rate 19, height 5\' 8"  (1.727 m), weight 75.7 kg (166 lb 14.2 oz), SpO2 96.00%.  No results found for this or any previous visit (from the past 72 hour(s)).   HEENT: oromotor weakness Cardio: RRR and no murmur Resp: Rales and upper airway sounds, unlabored, breath sounds improved no upper airway sounds GI: BS positive and non tender Extremity:  Pulses positive and No Edema Skin:   Intact Neuro: Alert/Oriented,  , Abnormal Motor 4/5 in BUE and BLE, Abnormal FMC Ataxic/ dec FMC and Dysarthric Musc/Skel:  Normal, no tenderness in thoracic spine Gen NAD,    Assessment/Plan: 1. 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 Physiatrist is providing close team supervision and 24 hour management of active medical problems listed below. Physiatrist and rehab team continue to assess barriers to discharge/monitor patient progress toward functional and medical goals.  ?SNF bed this week. Awaiting word, perhaps today FIM: FIM - Bathing Bathing Steps Patient Completed: Chest;Right Arm;Left Arm;Abdomen;Front perineal area;Buttocks;Right upper leg;Left upper leg;Right lower leg (including foot);Left lower leg (including foot) Bathing: 5: Supervision: Safety issues/verbal cues  FIM - Upper Body Dressing/Undressing Upper body dressing/undressing steps patient completed: Thread/unthread right sleeve of pullover shirt/dresss;Thread/unthread left sleeve of pullover shirt/dress;Put head through  opening of pull over shirt/dress;Pull shirt over trunk Upper body dressing/undressing: 5: Supervision: Safety issues/verbal cues FIM - Lower Body Dressing/Undressing Lower body dressing/undressing steps patient completed: Thread/unthread right underwear leg;Thread/unthread left underwear leg;Pull underwear up/down;Thread/unthread right pants leg;Thread/unthread left pants leg;Pull pants up/down;Fasten/unfasten pants;Don/Doff right sock;Don/Doff left sock;Don/Doff right shoe;Don/Doff left shoe;Fasten/unfasten right shoe;Fasten/unfasten left shoe Lower body dressing/undressing: 5: Set-up assist to: Obtain clothing  FIM - Toileting Toileting steps completed by patient: Adjust clothing prior to toileting;Performs perineal hygiene;Adjust clothing after toileting Toileting Assistive Devices: Grab bar or rail for support Toileting: 4: Steadying assist  FIM - Diplomatic Services operational officer Devices: Grab bars Toilet Transfers: 0-Activity did not occur  FIM - Banker Devices: Arm rests Bed/Chair Transfer: 6: Supine > Sit: No assist;6: Sit > Supine: No assist;6: Bed > Chair or W/C: No assist;6: Chair or W/C > Bed: No assist  FIM - Locomotion: Wheelchair Distance: 300 Locomotion: Wheelchair: 6: Travels 150 ft or more, turns around, maneuvers to table, bed or toilet, negotiates 3% grade: maneuvers on rugs and over door sills independently FIM - Locomotion: Ambulation Locomotion: Ambulation Assistive Devices: Designer, industrial/product Ambulation/Gait Assistance: 5: Supervision Locomotion: Ambulation: 5: Travels 150 ft or more with supervision/safety issues  Comprehension Comprehension Mode: Auditory Comprehension: 5-Understands basic 90% of the time/requires cueing < 10% of the time  Expression Expression Mode: Verbal Expression: 4-Expresses basic 75 - 89% of the time/requires cueing 10 - 24% of the time. Needs helper to occlude trach/needs to repeat  words.  Social Interaction Social Interaction: 5-Interacts appropriately 90% of the time - Needs monitoring or encouragement for participation or interaction.  Problem Solving Problem Solving: 5-Solves basic 90% of the time/requires cueing < 10% of the time  Memory Memory: 3-Recognizes or  recalls 50 - 74% of the time/requires cueing 25 - 49% of the time  Medical Problem List and Plan:  1. DVT Prophylaxis/Anticoagulation: Pharmaceutical: Lovenox  2. Pain Management: N/A  3. Mood: Will have LCSW follow for evaluation.  4. Neuropsych: This patient is not capable of making decisions on his/her own behalf.  5. HTN: Monitor with bid checks. Continue HCTZ and Prinivil for now-- increasing prinivil to bid. Continue hctz as renal function permits.  6. COPD: Continue spiriva. Prn nebs for SOB. Keep HOB elevated at 30 degrees. Encourage IS.  7. Dysphagia,Severe:. ReCheck MBS showed "safe" for thin liquid D2 diet, pt with decreased fluid intake but good po caloric intake.  On nectar liquids with  H20 protocol Allow D2 solids   -advance to thin liquids?--there is a question over safety and use of appropriate techniques and precautions with thins 8.  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  Pt not c/o pain in that area.  Urology to follow up       ,LOS (Days) 24 A FACE TO FACE EVALUATION WAS PERFORMED  Keath Matera T 01/02/2013, 6:56 AM

## 2013-01-02 NOTE — Progress Notes (Signed)
Social Work Discharge Note Discharge Note  The overall goal for the admission was met for:   Discharge location: No[facility]  Length of Stay: No-24 DAYS  Discharge activity level: Yes-SUPERVISION/MIN LEVEL  Home/community participation: Yes  Services provided included: MD, RD, PT, OT, SLP, RN, TR, Pharmacy and SW  Financial Services: Medicaid  Follow-up services arranged: Other: SHORT TERM NHP  Comments (or additional information):INFORMED COUSIN WHEN HERE AND GAVE HIM CONTACT INFORMATION FOR FACILITY  Patient/Family verbalized understanding of follow-up arrangements: Yes  Individual responsible for coordination of the follow-up plan: SELF  Confirmed correct DME delivered: Lucy Chris 01/02/2013    Lucy Chris

## 2013-01-02 NOTE — Plan of Care (Signed)
Problem: RH SAFETY Goal: RH STG ADHERE TO SAFETY PRECAUTIONS W/ASSISTANCE/DEVICE STG Adhere to Safety Precautions With supervision  Outcome: Not Progressing Does not adhere to safety plan.  Goal: RH STG DECREASED RISK OF FALL WITH ASSISTANCE STG Decreased Risk of Fall With supervision  Outcome: Not Progressing Does not follow safety plan.

## 2013-01-02 NOTE — Progress Notes (Signed)
Occupational Therapy Session Note  Patient Details  Name: [name] MRN: [id number] Date of Birth: [birth year]  Today's Date: 01/02/2013 Time: 0930-1030 (60 minutes)  Short Term Goals: Week 1:  OT Short Term Goal 1 (Week 1): Pt. will bath self using sit to stand with supervision OT Short Term Goal 1 - Progress (Week 1): Met OT Short Term Goal 2 (Week 1): Pt. will groom self with standing at sup for 5 minutes OT Short Term Goal 2 - Progress (Week 1): Progressing toward goal OT Short Term Goal 3 (Week 1): Pt. will dress self at supervision level OT Short Term Goal 3 - Progress (Week 1): Met OT Short Term Goal 4 (Week 1): Pt. will engage in toilet transfer at supervision level OT Short Term Goal 4 - Progress (Week 1): Progressing toward goal OT Short Term Goal 5 (Week 1): Pt. will transfer to shower stall at minimal assist level OT Short Term Goal 5 - Progress (Week 1): Met Week 2:  OT Short Term Goal 1 (Week 2): Pt. will groom self with standing at sup for 5 minutes OT Short Term Goal 1 - Progress (Week 2): Met OT Short Term Goal 2 (Week 2): Pt. will engage in toilet transfer at supervision level OT Short Term Goal 2 - Progress (Week 2): Met OT Short Term Goal 3 (Week 2): Pt will transfer to shower stall at supervision level OT Short Term Goal 3 - Progress (Week 2): Progressing toward goal Week 3:  OT Short Term Goal 1 (Week 3): Pt will transfer to shower stall at supervision level with RW OT Short Term Goal 1 - Progress (Week 3): Met OT Short Term Goal 2 (Week 3): STG = LTGs due to remaining LOS OT Short Term Goal 2 - Progress (Week 3): Progressing toward goal Week 4:  OT Short Term Goal 1 (Week 4): Pt will complete transfers and functional mobility with RW with <25% cues for safety OT Short Term Goal 2 (Week 4): Pt will complete grooming tasks in standing with supervision with more typical BOS (shoulder width) OT Short Term Goal 3 (Week 4): Pt will complete 80% of bathing in  standing without LOB  Skilled Therapeutic Interventions/Progress Updates:  Individual Therapy Pt with no c/o pain.  Upon entering room, pt seated on EOB. Pt reported he did not want to shower and was already dressed for the day. Pt propelled to ADL apt using W/C (~ 150 ft) addressing UB endurance/strength. Pt completed simple meal prep activity which addressed the pt's dynamic standing balance/endurance and general safety using the R/W. Pt practiced simulated transfers in and out of the shower using a R/W.  Next, pt ambulated to rehab gym and engaged in Wii game without UE support.  Skilled intervention addressed the pt's dynamic standing balance/endurance. From here pt propelled W/C back to his room and transferred from W/C->EOB. At end of tx session, pt left seated EOB with call bell and phone within reach.  Therapy Documentation Precautions:  Precautions Precautions: Fall Precaution Comments: PEG Restrictions Weight Bearing Restrictions: No  See FIM for current functional status  Therapy/Group: Individual Therapy  [name] 01/02/2013, 12:08 PM

## 2013-01-02 NOTE — Discharge Summary (Signed)
Physician Discharge Summary  Patient ID: [name] MRN: [id number] DOB/AGE: [birth year] 64 y.o.  Admit date: 12/09/2012 Discharge date: 01/02/2013  Discharge Diagnoses:  Principal Problem:   Stroke, acute, embolic Active Problems:   Hypertension   COPD   Dyslipidemia   Discharged Condition:  Good.   Significant Diagnostic Studies: Dg Chest 2 View  12/17/2012   *RADIOLOGY REPORT*  Clinical Data: Cough and congestion.  Question aspiration.  CHEST - 2 VIEW  Comparison: Two-view chest 12/13/2012.  Findings: Heart size is normal.  And nodular densities are again seen in both lungs.  Bilateral pleural effusions are slightly more prominent than on the prior exam.  No significant airspace consolidation is evident to suggest aspiration.  The visualized soft tissues and bony thorax are unremarkable.  IMPRESSION:  1.  No evidence for significant aspiration. 2.  Slight increase in bilateral pleural effusions. 3.  Stable bilateral nodular densities and both lungs.  Mild edema or atypical airspace disease is not excluded.   Original Report Authenticated By: Marin Roberts, M.D.   Nm Bone Scan Whole Body  12/26/2012   *RADIOLOGY REPORT*  Clinical Data: Prostate cancer  NUCLEAR MEDICINE WHOLE BODY BONE SCINTIGRAPHY  Technique:  Whole body anterior and posterior images were obtained approximately 3 hours after intravenous injection of radiopharmaceutical.  Radiopharmaceutical: CURIE TC-MDP TECHNETIUM TC 25M MEDRONATE IV KIT  Comparison: None.  Findings: There is urine contamination artifact along the medial aspect of the left lower extremity.  Nonspecific focus of increased radiotracer uptake is identified corresponding to the posterior elements of the T6 vertebra.  Post-traumatic deformity is noted involving the lateral aspect of the left fifth rib.  Presumed old rib fractures involve the posterior aspect of the left eighth and tenth ribs.  Degenerative changes are noted within the medial  compartments of both knees left greater than right.  IMPRESSION: There is a nonspecific focus of increased uptake within the posterior aspect of the T6 vertebra.  Small area of bone metastasis cannot be excluded. 2.  Post-traumatic rib deformities.   Original Report Authenticated By: Signa Kell, M.D.    Labs:   BMET    Component Value Date/Time   NA 140 12/22/2012 0545   K 4.2 12/22/2012 0545   CL 102 12/22/2012 0545   CO2 30 12/22/2012 0545   GLUCOSE 88 12/22/2012 0545   BUN 10 12/22/2012 0545   CREATININE 0.91 12/22/2012 0545   CREATININE 0.99 10/18/2012 0927   CALCIUM 10.3 12/22/2012 0545   GFRNONAA 88* 12/22/2012 0545   GFRAA >90 12/22/2012 0545   CBC    Component Value Date/Time   WBC 9.0 12/21/2012 0710   RBC 4.39 12/21/2012 0710   HGB 12.9* 12/21/2012 0710   HCT 38.0* 12/21/2012 0710   PLT 403* 12/21/2012 0710   MCV 86.6 12/21/2012 0710   MCH 29.4 12/21/2012 0710   MCHC 33.9 12/21/2012 0710   RDW 13.1 12/21/2012 0710   LYMPHSABS 2.4 12/12/2012 0510   MONOABS 1.1* 12/12/2012 0510   EOSABS 0.2 12/12/2012 0510   BASOSABS 0.0 12/12/2012 0510       CBG: No results found for this basename: GLUCAP,  in the last 168 hours  Brief HPI:   [name] is a 61 y.o. RH-male with history of HTN, COPD, prior CVA; who was admitted on 12/05/12 with acute onset of left hand weakness, unsteady gait and slurred speech. Noted to have elevated SBP-220. Patient reported falls x 3 and dizziness earlier that day. He was started  on cardene drip and MRI of brain done revealing acute right cerebellar and right corpus callosum infarcts. Work up initiated and ST evaluation reveals signs of moderate to severe dysphagia with difficulty handling oral secretions therefore NPO recommended. PT/OT evaluations done revealing patient with poor spacial awareness with decreased vision, ataxia and severe balance deficits. Therapy team recommended CIR and patient was admitted on 12/09/12    Hospital Course: [name] was  admitted to rehab 12/09/2012 for inpatient therapies to consist of PT, ST and OT at least three hours five days a week. Past admission physiatrist, therapy team and rehab RN have worked together to provide customized collaborative inpatient rehab. He was maintained on tube feeds due to severe dysphagia. Blood pressures were monitored on bid basis and Prinivil was increased for better control. Bone scan was ordered by urology and patient to follow up with Dr. Brunilda Payor past discharge. PEG was placed for nutritional support by IVR on 12/21/12 and is being used for water flushes. He is able to handle oral secretions with improvement in speech and dysphagia.  Diet was initiated and patient is tolerating D2 diet with nectar liquids.  He continues to require min assist to supervision for ADLs, mobility as well as dysphagia.  No local family available for support and patient has elected on further therapies at SNF level.   Rehab course: During patient's stay in rehab weekly team conferences were held to monitor patient's progress, set goals and discuss barriers to discharge. Speech therapy-addressing cognitive-linguistic goals. Dysphagia treatment on going with focus on utilization of safe swallow strategies with focus on current diet recommendations as well as teaching of water protocol. He requires max encouragment to participate in trials of thin liquids. He exhibited no overt s/s of aspiration duirng consumption of 2oz of water but spit out clear frothy secreations after sips. He requires min assist to follow swallow strategies and is has decreased mental flexibility regarding recent medical changes.  Supervision is recommended due to safety concerns about diet adherence as well as medication management. Occupational therapy has focused on neuromuscular reeducation of UE as well as ADL tasks and endurance. Patient has had improvement in activity tolerance, balance, postural control, ability to compensate for deficits,  functional use of RIGHT upper and lower extremity. He continues to require mod verbal cues for safety with self-care tasks secondary to quick movements with frequent LOB when reaching outside BOS, when turning and if distracted.  He requires min assist to complete ADL tasks.  Physical therapy has focused on bed mobility, transfers, w/c propulsion, neuromuscular re-education. He is independent for transfers.  He is ambulating at modified independent level with a walker. Her requires supervision to min assist if not using RW and for stair navigation.  Bed is available at [facility] in [place] on 05/26.   Disposition: Skilled Nursing Facility   Diet: D2, nectar liquids by teaspoons.  Full supervision. No straws. Multiple swallows. Medications crushed in puree or via PEG.   Special Instructions:  1. Wash around PEG site with soap and water, pat dry. Keep dry dressing under bolster.  2. Water protocol with speech therapy.  3. Incentive spirometry qid.         Future Appointments Provider Department Dept Phone   01/02/2013 2:45 PM Melissa Teofilo Pod, CCC-SLP [facility]  [address]  [place] [phone]       Medication List    TAKE these medications       acetaminophen 325 MG tablet  Commonly known as:  TYLENOL  Take 1-2 tablets (325-650 mg total) by mouth every 4 (four) hours as needed.     albuterol 108 (90 BASE) MCG/ACT inhaler  Commonly known as:  PROVENTIL HFA;VENTOLIN HFA  Inhale 2 puffs into the lungs every 6 (six) hours as needed for wheezing.     antiseptic oral rinse Liqd  15 mLs by Mouth Rinse route 2 times daily at 12 noon and 4 pm.     atorvastatin 80 MG tablet  Commonly known as:  LIPITOR  Take 1 tablet (80 mg total) by mouth daily at 6 PM.     atorvastatin 80 MG tablet  Commonly known as:  LIPITOR  Place 1 tablet (80 mg total) into feeding tube daily at 6 PM.     baclofen 10 MG tablet  Commonly known as:  LIORESAL  Place 1 tablet (10 mg  total) into feeding tube 3 (three) times daily.     bisacodyl 10 MG suppository  Commonly known as:  DULCOLAX  Place 1 suppository (10 mg total) rectally daily as needed.     clopidogrel 75 MG tablet  Commonly known as:  PLAVIX  Take 1 tablet (75 mg total) by mouth daily.     free water Soln  Place 150 mLs into feeding tube 5 (five) times daily.     hydrochlorothiazide 25 MG tablet  Commonly known as:  HYDRODIURIL  Take 1 tablet (25 mg total) by mouth daily.     lisinopril 10 MG tablet  Commonly known as:  PRINIVIL,ZESTRIL  Take 1 tablet (10 mg total) by mouth daily.     pantoprazole sodium 40 mg/20 mL Pack  Commonly known as:  PROTONIX  Place 20 mLs (40 mg total) into feeding tube 2 (two) times daily.     potassium chloride 20 MEQ/15ML (10%) solution  Place 15 mLs (20 mEq total) into feeding tube daily.     tiotropium 18 MCG inhalation capsule  Commonly known as:  SPIRIVA HANDIHALER  Place 1 capsule (18 mcg total) into inhaler and inhale daily.       Follow-up Information   Follow up with Erick Colace, MD. Call in 2 days. (for follow up in 2-3 weeks. )    Contact information:   [address] [place] [address] [phone]       Follow up with Gates Rigg, MD. Call today. (for follow up in 4-6 weeks. )    Contact information:   [address] [place] [address] [phone]       Schedule an appointment as soon as possible for a visit with NESI,MARC-HENRY, MD. (for follow up in 2 weeks. )    Contact information:   [address] [place] [address] [phone]       Signed: Jacquelynn Cree 01/02/2013, 12:25 PM

## 2013-01-02 NOTE — Progress Notes (Signed)
Pt discharged at 1322 via stretcher to [facility]. Belongings with pt.

## 2013-01-02 NOTE — Progress Notes (Signed)
Speech Language Pathology Discharge Summary  Patient Details  Name: [name] MRN: [id number] Date of Birth: 04/23/1951  Today's Date: 01/02/2013  Patient has met 5 of 5 long term goals.  Patient to discharge at overall Supervision level.  Reasons goals not met: n/a   Clinical Impression/Discharge Summary: Patient met 5 out of 5 long term goals during CIR stay due to gains in advancement from NPO to p.o. diet of Dys.2 textures and Nectar-thick liquids with the Free Water Protocol between meals.  Patient is unable to verbalize diet restrictions and requires Supervision with meals due to impulsivity and decreased safety awareness (will continue to eat and drink when coughing).  Patient also made gains in speech intelligibility due to improved management or pharyngeal secretions.  Patient continues to require skilled SLP services to address these deficits with goals for least restrictive p.o. intake and ongoing trials of thin liquids with meals.    Care Partner:  Caregiver Able to Provide Assistance: No  Type of Caregiver Assistance: Cognitive  Recommendation:  24 hour supervision/assistance;Skilled Nursing facility  Rationale for SLP Follow Up: Maximize swallowing safety;Maximize cognitive function and independence;Reduce caregiver burden   Equipment: thickener   Reasons for discharge: Treatment goals met;Discharged from hospital   Patient/Family Agrees with Progress Made and Goals Achieved: Yes   See FIM for current functional status  Charlane Ferretti., CCC-SLP [phone]  Maveric Debono 01/02/2013, 4:21 PM

## 2013-01-02 NOTE — Progress Notes (Signed)
Physical Therapy Discharge Summary  Patient Details  Name: [name] MRN: [id number] Date of Birth: Jul 10, 1951  Today's Date: 01/02/2013 Time: 1100-1151 Time Calculation (min): 51 min  Patient has met 8 of 8 long term goals due to improved balance, improved postural control, increased strength, ability to compensate for deficits, functional use of  right upper extremity and right lower extremity, improved attention, improved awareness and improved coordination.  Patient to discharge at an ambulatory level Modified Independent with RW but still requires supervision-min A for gait without RW and for stair negotiation.  Per SLP patient continues to require close supervision and cues to perform eating and drinking safely to reduce risk of aspiration.   Patient's care partner unavailable to provide the necessary cognitive and supervision assistance at discharge and secondary to patient not having 24/7 supervision for eating and PEG management patient will D/C to SNF for more prolonged rehabilitation to reach higher level of functional independence prior to returning to boarding home.  Reasons goals not met: All goals met  Recommendation:  Patient will benefit from ongoing skilled PT services in skilled nursing facility setting to continue to advance safe functional mobility, address ongoing impairments in R sided strength, motor control, timing, sequencing, coordination and muscle grading of movement, postural control, dynamic balance, gait, and minimize fall risk.  Equipment: No equipment provided  Reasons for discharge: treatment goals met and discharge from hospital  Patient/family agrees with progress made and goals achieved: Yes  PT Discharge Precautions/Restrictions Precautions Precautions: Fall Precaution Comments: PEG Vital Signs Therapy Vitals Pulse Rate: 87 BP: 128/77 mmHg Patient Position, if appropriate: Sitting Oxygen Therapy SpO2: 95 % O2 Device: None (Room  air) Pain Pain Assessment Pain Assessment: No/denies pain Pain Score: 0-No pain Vision/Perception  Perception Perception: Within Functional Limits Praxis Praxis: Impaired Praxis Impairment Details: Motor planning  Cognition Orientation Level: Oriented X4 Sensation Sensation Light Touch: Appears Intact Stereognosis: Not tested Hot/Cold: Not tested Proprioception: Appears Intact Additional Comments: Patient reports sensation is improving Coordination Gross Motor Movements are Fluid and Coordinated: No Coordination and Movement Description: Mild ataxia; still limited by RLE weakness Motor  Motor Motor: Hemiplegia;Ataxia Motor - Discharge Observations: Still presents with R sided weakness and mild ataxia  Mobility Bed Mobility Supine to Sit: 6: Modified independent (Device/Increase time) Sit to Supine: 6: Modified independent (Device/Increase time) Transfers Stand Pivot Transfers: 6: Modified independent (Device/Increase time) Stand Pivot Transfer Details (indicate cue type and reason): Requires UE support or extra time to perform stand pivot secondary to intermittent RLE buckling or LOB Locomotion  Ambulation Ambulation: Yes Ambulation/Gait Assistance: 6: Modified independent (Device/Increase time) Ambulation Distance (Feet): 150 Feet Assistive device: Rolling walker Ambulation/Gait Assistance Details: Patient is mod I with RW in controlled environment but continues to require supervision-min A without UE support on AD especially during dual task; as patient fatigues he continues to present with increased R trunk latera lean, R knee flexion in stance and R lateral LOB Gait Gait Pattern: Step-through pattern;Decreased step length - right;Decreased weight shift to left;Ataxic;Wide base of support;Decreased trunk rotation High Level Ambulation High Level Ambulation: Side stepping;Backwards walking;Head turns;Other high level ambulation Side Stepping: With no UE support x 50'  each direction during dual task of bouncing a ball and at times engaging in conversation with min A overall  Backwards Walking: With no UE support x 50' each direction during dual task of bouncing a ball and at times engaging in conversation with min A overall  High Level Ambulation - Other Comments: Forwards  ambulation without UE support x 50' x 2 reps with dual task of ball bouncing, head turns and engaging in conversations; min A overall with patient experiencing LOB and RLE fatigue during second repetition Stairs / Additional Locomotion Stairs: Yes Stairs Assistance: 4: Min guard;5: Supervision Stairs Assistance Details (indicate cue type and reason): Performed up and down 2 flights of stairs with one UE support on rail with supervision for ascending with verbal cues for safety and min A to descend secondary to patient performing quickly and not placing foot fully on step below.   Stair Management Technique: One rail Right;Alternating pattern;Forwards Number of Stairs: 24 Height of Stairs: 7 Wheelchair Mobility Wheelchair Mobility: Yes Wheelchair Assistance: 6: Modified independent (Device/Increase time) Occupational hygienist: Both upper extremities Wheelchair Parts Management: Independent Distance: 300  Trunk/Postural Assessment  Cervical Assessment Cervical Assessment: Within Functional Limits Thoracic Assessment Thoracic Assessment: Within Functional Limits Lumbar Assessment Lumbar Assessment: Within Functional Limits Postural Control Postural Control: Deficits on evaluation (Overcompensates with stepping reaction)  Balance Balance Balance Assessed: Yes Standardized Balance Assessment Standardized Balance Assessment: Berg Balance Test Berg Balance Test Sit to Stand: Able to stand  independently using hands Standing Unsupported: Able to stand safely 2 minutes Sitting with Back Unsupported but Feet Supported on Floor or Stool: Able to sit safely and securely 2 minutes Stand to  Sit: Sits safely with minimal use of hands Transfers: Able to transfer safely, minor use of hands Standing Unsupported with Eyes Closed: Able to stand 10 seconds safely Standing Ubsupported with Feet Together: Able to place feet together independently and stand for 1 minute with supervision From Standing, Reach Forward with Outstretched Arm: Can reach confidently >25 cm (10") From Standing Position, Pick up Object from Floor: Able to pick up shoe safely and easily From Standing Position, Turn to Look Behind Over each Shoulder: Looks behind from both sides and weight shifts well Turn 360 Degrees: Able to turn 360 degrees safely in 4 seconds or less Standing Unsupported, Alternately Place Feet on Step/Stool: Able to stand independently and complete 8 steps >20 seconds Standing Unsupported, One Foot in Front: Able to take small step independently and hold 30 seconds Standing on One Leg: Tries to lift leg/unable to hold 3 seconds but remains standing independently Total Score: 48 Patient demonstrates increased fall risk as noted by score of 48/56 on Berg Balance Scale.  (<36= high risk for falls, close to 100%; 37-45 significant >80%; 46-51 moderate >50%; 52-55 lower >25%)  Extremity Assessment   RLE Strength RLE Overall Strength Comments: 4/5 per MMT but continues to present with intermittent R knee buckling and impaired coordination, motor control, timing, sequencing and grading LLE Assessment LLE Assessment: Within Functional Limits  See FIM for current functional status  [name] [facility] 01/02/2013, 11:58 AM

## 2013-01-03 NOTE — Progress Notes (Signed)
Occupational Therapy Discharge Summary  Patient Details  Name: GRECO GASTELUM MRN: [id number] Date of Birth: October 25, 1950  Today's Date: 01/03/2013  Patient has met 9 of 9 long term goals due to improved activity tolerance, improved balance, postural control, functional use of  RIGHT upper extremity, improved attention and improved awareness.  Patient to discharge at overall Supervision level.  Patient's care partner unavailable to provide the necessary physical and cognitive assistance at discharge.  Patient discharged to SNF to receive more prolonged rehabilitation to reach higher level functional goals to return to boarding home.    Reasons goals not met: N/A  Recommendation:  Patient will benefit from ongoing skilled OT services in skilled nursing facility setting to continue to advance functional skills in the area of BADL and Reduce care partner burden.  Equipment: No equipment provided  Reasons for discharge: treatment goals met and discharge from hospital  Patient/family agrees with progress made and goals achieved: Yes  OT Discharge ADL ADL Grooming: Supervision/safety Where Assessed-Grooming: Standing at sink Upper Body Bathing: Supervision/safety Where Assessed-Upper Body Bathing: Shower Lower Body Bathing: Supervision/safety Where Assessed-Lower Body Bathing: Shower Upper Body Dressing: Supervision/safety Where Assessed-Upper Body Dressing: Edge of bed Lower Body Dressing: Supervision/safety Where Assessed-Lower Body Dressing: Edge of bed Toilet Transfer: Close supervision Toilet Transfer Method: Proofreader: Acupuncturist: Close supervision Film/video editor Method: Designer, industrial/product: Information systems manager with back;Grab bars ADL Comments: Pt continues to require mod cues for safety with mobility and cues to slow down as pt moves quickly and requires occasional assitance esepially with turns and backing up.   Pt demonstrates wide BOS in all functional tasks, focus on increased balance activities with narrower BOS to increase balance reactions in functional tasks.  See FIM for current functional status  Leonette Monarch 01/03/2013, 12:12 PM

## 2013-01-06 ENCOUNTER — Inpatient Hospital Stay: Payer: Medicaid Other | Admitting: Physical Medicine & Rehabilitation

## 2013-01-24 ENCOUNTER — Encounter: Payer: Medicaid Other | Attending: Physical Medicine & Rehabilitation

## 2013-01-24 ENCOUNTER — Ambulatory Visit (HOSPITAL_BASED_OUTPATIENT_CLINIC_OR_DEPARTMENT_OTHER): Payer: Medicaid Other | Admitting: Physical Medicine & Rehabilitation

## 2013-01-24 ENCOUNTER — Encounter: Payer: Self-pay | Admitting: Physical Medicine & Rehabilitation

## 2013-01-24 VITALS — BP 146/79 | HR 76 | Resp 16 | Ht 68.0 in | Wt 172.0 lb

## 2013-01-24 DIAGNOSIS — C61 Malignant neoplasm of prostate: Secondary | ICD-10-CM | POA: Insufficient documentation

## 2013-01-24 DIAGNOSIS — R1310 Dysphagia, unspecified: Secondary | ICD-10-CM | POA: Insufficient documentation

## 2013-01-24 DIAGNOSIS — J449 Chronic obstructive pulmonary disease, unspecified: Secondary | ICD-10-CM | POA: Insufficient documentation

## 2013-01-24 DIAGNOSIS — I69991 Dysphagia following unspecified cerebrovascular disease: Secondary | ICD-10-CM | POA: Insufficient documentation

## 2013-01-24 DIAGNOSIS — I6340 Cerebral infarction due to embolism of unspecified cerebral artery: Secondary | ICD-10-CM

## 2013-01-24 DIAGNOSIS — J4489 Other specified chronic obstructive pulmonary disease: Secondary | ICD-10-CM | POA: Insufficient documentation

## 2013-01-24 DIAGNOSIS — I10 Essential (primary) hypertension: Secondary | ICD-10-CM | POA: Insufficient documentation

## 2013-01-24 NOTE — Progress Notes (Signed)
Subjective:    Patient ID: [name], male    DOB: February 19, 1951, 62 y.o.   MRN: [id number]  HPI Right cerebellar and right corpus callosum infarcts onset 12/05/2012. Admitted to [facility] inpatient rehabilitation may second 2014 and discharged 01/02/2013. Now resides at [facility] with nursing and rehabilitation in [facility] He may be discharged to home in the next week G-tube removed by the physician at the nursing facility Dressing himself, ambulating without walker or cane. Pain Inventory Average Pain 2 Pain Right Now 2 My pain is dull  In the last 24 hours, has pain interfered with the following? General activity 0 Relation with others 0 Enjoyment of life 0 What TIME of day is your pain at its worst? na Sleep (in general) Fair  Pain is worse with: unsure Pain improves with: na Relief from Meds: na  Mobility how many minutes can you walk? 20 ability to climb steps?  yes do you drive?  no use a wheelchair  Function retired  Neuro/Psych No problems in this area  Prior Studies Any changes since last visit?  no  Physicians involved in your care Any changes since last visit?  no   Family History  Problem Relation Age of Onset  . Liver disease Mother   . Heart attack Mother 36   History   Social History  . Marital Status: Single    Spouse Name: N/A    Number of Children: 0  . Years of Education: N/A   Occupational History  . Flooring instalation     Retired   Social History Main Topics  . Smoking status: Current Every Day Smoker -- 0.33 packs/day    Types: Cigarettes  . Smokeless tobacco: Never Used  . Alcohol Use: 7 - 10.5 oz/week    14-21 drink(s) per week     Comment: 2-3 beers per day  . Drug Use: No     Comment: history of cocaine use  . Sexually Active: Not Currently   Other Topics Concern  . None   Social History Narrative   Drinks 1/2 pt of wine daily and beer daily (at least 12 oz)   Denies recreational drugs     Smokes 3-4 cigarettes. 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    2 years of college at [employer] per patient          Past Surgical History  Procedure Laterality Date  . 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 Hypertension 05/18/2007  . COPD 05/18/2007  . Elevated PSA 05/25/2007  . 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 Stroke 12/05/2012   BP 146/79  Pulse 76  Resp 16  Ht 5\' 8"  (1.727 m)  Wt 172 lb (78.019 kg)  BMI 26.16 kg/m2  SpO2 98%     Review of Systems  All other systems reviewed and are negative.       Objective:   Physical Exam  Right deltoid bicep tricep grip 4/5 left deltoid bicep tricep grip 5/5 Bilateral hip flexors knee extensors ankle dorsiflexors and plantar flexors 5/5 Ambulation is without assistive device no evidence of toe drag or knee instability. No evidence of loss of balance.  G-tube site is well-healed. No evidence of drainage no tenderness     Assessment & Plan:  1. Right cerebellar and corpus callosum infarct with swallow disorder with improvement in  diet normal. G-tube is out. Ambulation is normal. Patient appears to be ready for discharge from skilled nursing facility.  Followup with Dr. Brunilda Payor from urology for prostate carcinoma Followup with primary care physician  Return to clinic when necessary

## 2013-01-24 NOTE — Patient Instructions (Addendum)
Urologist followup Primary care followup

## 2013-03-27 ENCOUNTER — Other Ambulatory Visit: Payer: Self-pay | Admitting: *Deleted

## 2013-03-30 MED ORDER — ATORVASTATIN CALCIUM 80 MG PO TABS: 80.0000 mg | ORAL_TABLET | Freq: Every day | ORAL | Status: DC

## 2013-03-30 MED ORDER — PANTOPRAZOLE SODIUM 40 MG PO PACK: 40.0000 mg | PACK | Freq: Two times a day (BID) | ORAL | Status: DC

## 2013-04-03 ENCOUNTER — Telehealth: Payer: Self-pay | Admitting: *Deleted

## 2013-04-03 NOTE — Telephone Encounter (Signed)
Appt made for checkup - denies no new problems. Appt made 04/21/13 3:45PM Dr Drucilla Chalet. [facility] Kunal Levario RN 04/03/13 4:30PM

## 2013-04-03 NOTE — Telephone Encounter (Addendum)
Rx refill on hold until clarify direction of protonix.

## 2013-04-03 NOTE — Telephone Encounter (Signed)
Rx called in, Pt called and he has had a feeding tube in past but now he takes all meds by mouth.

## 2013-04-21 ENCOUNTER — Ambulatory Visit (INDEPENDENT_AMBULATORY_CARE_PROVIDER_SITE_OTHER): Payer: Medicaid Other | Admitting: Internal Medicine

## 2013-04-21 ENCOUNTER — Encounter: Payer: Self-pay | Admitting: Internal Medicine

## 2013-04-21 VITALS — BP 153/88 | HR 90 | Temp 97.2°F | Wt 181.6 lb

## 2013-04-21 DIAGNOSIS — R972 Elevated prostate specific antigen [PSA]: Secondary | ICD-10-CM

## 2013-04-21 DIAGNOSIS — R2240 Localized swelling, mass and lump, unspecified lower limb: Secondary | ICD-10-CM | POA: Insufficient documentation

## 2013-04-21 DIAGNOSIS — I10 Essential (primary) hypertension: Secondary | ICD-10-CM

## 2013-04-21 DIAGNOSIS — I6340 Cerebral infarction due to embolism of unspecified cerebral artery: Secondary | ICD-10-CM

## 2013-04-21 DIAGNOSIS — R2242 Localized swelling, mass and lump, left lower limb: Secondary | ICD-10-CM

## 2013-04-21 DIAGNOSIS — I639 Cerebral infarction, unspecified: Secondary | ICD-10-CM

## 2013-04-21 DIAGNOSIS — Z23 Encounter for immunization: Secondary | ICD-10-CM

## 2013-04-21 DIAGNOSIS — R229 Localized swelling, mass and lump, unspecified: Secondary | ICD-10-CM

## 2013-04-21 NOTE — Progress Notes (Signed)
I saw and evaluated the patient.  I personally confirmed the key portions of the history and exam documented by Dr. Jones and I reviewed pertinent patient test results.  The assessment, diagnosis, and plan were formulated together and I agree with the documentation in the resident's note.   

## 2013-04-21 NOTE — Patient Instructions (Signed)
1. Please make follow up appointment for 4-6 weeks. 2. Please take all medications as prescribed.  3. If you have worsening of your symptoms or new symptoms arise, please call the clinic ([phone]), or go to the ER immediately if symptoms are severe.  You have done great job in taking all your medications. I appreciate it very much. Please continue doing that.

## 2013-04-21 NOTE — Progress Notes (Signed)
Patient ID: [name], male   DOB: 06-14-1951, 62 y.o.   MRN: [id number]   Subjective:   Patient ID: [name] male   DOB: 07-12-1951 62 y.o.   MRN: [id number]  HPI: [name] is a 62 y.o. M w/ PMHx of HTN, COPD, recent diagnosis of Prostate CA?, and CVA (11/2012), presents to [facility] today for follow up. He denies any recent health complaints and says he feels okay recently. He had a significant CVA in April and was hospitalized for 20+ days and was then sent to a rehab facility to follow. The patient states that he has no issues at this point with weakness, gait, dysphagia, sensory losses, etc.  The patient does complain of a large mass on his left shin, just below the knee that has been present for 10-12 years, but he said he is now "ready to get rid of it". He denies any recent change in size, pain, irritation, or redness. On examination, the mass resembles a cystic structure, about 4cm x 5cm x 7cm, and is firm, smooth, no erythema, or tenderness. It is also mobile, not adhering to the patella or underlying tibia, but is more firm than a lipoma.  Lastly, the patient follows with Dr. Brunilda Pittman at [facility] for an elevated PSA (last recorded 236.2 on urology note). According to the patient, he has been diagnosed with Prostate CA at this time, however, no recent notes from the urologist show this definitive diagnosis. He will follow up there in the next couple of weeks for a visit and blood work. He denies any other complaints at this time. Denies any recent fever, chills, weight loss, night sweats, cough, SOB, chest pain, abdominal pain, nausea, vomiting, diarrhea, dysuria, or other symptoms.   Past Medical History  Diagnosis Date  . Hypertension 05/18/2007  . COPD 05/18/2007  . Elevated PSA 05/25/2007  . 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 Stroke 12/05/2012   Current Outpatient Prescriptions  Medication Sig Dispense Refill  . acetaminophen (TYLENOL) 325 MG tablet Take  1-2 tablets (325-650 mg total) by mouth every 4 (four) hours as needed.      Marland Kitchen albuterol (PROVENTIL HFA;VENTOLIN HFA) 108 (90 BASE) MCG/ACT inhaler Inhale 2 puffs into the lungs every 6 (six) hours as needed for wheezing.  8.5 g  11  . antiseptic oral rinse (BIOTENE) LIQD 15 mLs by Mouth Rinse route 2 times daily at 12 noon and 4 pm.      . atorvastatin (LIPITOR) 80 MG tablet Place 1 tablet (80 mg total) into feeding tube daily at 6 PM.      . atorvastatin (LIPITOR) 80 MG tablet Take 1 tablet (80 mg total) by mouth daily at 6 PM.      . baclofen (LIORESAL) 10 MG tablet Place 1 tablet (10 mg total) into feeding tube 3 (three) times daily.  30 each  0  . bisacodyl (DULCOLAX) 10 MG suppository Place 1 suppository (10 mg total) rectally daily as needed.  12 suppository  0  . chlorhexidine (PERIDEX) 0.12 % solution Use as directed 15 mLs in the mouth or throat 2 (two) times daily.      . cholecalciferol (VITAMIN D) 400 UNITS TABS Take 1,000 Units by mouth daily.      . clopidogrel (PLAVIX) 75 MG tablet Take 1 tablet (75 mg total) by mouth daily.  30 tablet  11  . hydrochlorothiazide (HYDRODIURIL) 25 MG tablet Take 1 tablet (25 mg total) by  mouth daily.      Marland Kitchen lisinopril (PRINIVIL,ZESTRIL) 10 MG tablet Take 1 tablet (10 mg total) by mouth daily.  30 tablet  11  . pantoprazole sodium (PROTONIX) 40 mg/20 mL PACK Place 20 mLs (40 mg total) into feeding tube 2 (two) times daily.  30 each    . potassium chloride 20 MEQ/15ML (10%) solution Place 15 mLs (20 mEq total) into feeding tube daily.  500 mL  0  . tiotropium (SPIRIVA HANDIHALER) 18 MCG inhalation capsule Place 1 capsule (18 mcg total) into inhaler and inhale daily.  30 capsule  11  . Water For Irrigation, Sterile (FREE WATER) SOLN Place 150 mLs into feeding tube 5 (five) times daily.       No current facility-administered medications for this visit.   Family History  Problem Relation Age of Onset  . Liver disease Mother   . Heart attack Mother 66     History   Social History  . Marital Status: Single    Spouse Name: N/A    Number of Children: 0  . Years of Education: N/A   Occupational History  . Flooring instalation     Retired   Social History Main Topics  . Smoking status: Current Every Day Smoker -- 0.33 packs/day    Types: Cigarettes  . Smokeless tobacco: Never Used  . Alcohol Use: 7 - 10.5 oz/week    14-21 drink(s) per week     Comment: 2-3 beers per day  . Drug Use: No     Comment: history of cocaine use  . Sexual Activity: Not Currently   Other Topics Concern  . Not on file   Social History Narrative   Drinks 1/2 pt of wine daily and beer daily (at least 12 oz)   Denies recreational drugs    Smokes 3-4 cigarettes. 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    2 years of college at [employer] per patient          Review of Systems: General: Denies fever, chills, diaphoresis, appetite change and fatigue.  HEENT: Denies change in vision, eye pain, redness, hearing loss, congestion, sore throat, rhinorrhea, sneezing, mouth sores, trouble swallowing, neck pain, neck stiffness and tinnitus.   Respiratory: Denies SOB, DOE, cough, chest tightness, and wheezing.   Cardiovascular: Denies chest pain, palpitations and leg swelling.  Gastrointestinal: Denies nausea, vomiting, abdominal pain, diarrhea, constipation, blood in stool and abdominal distention.  Genitourinary: Denies dysuria, urgency, frequency, hematuria, flank pain and difficulty urinating.  Endocrine: Denies hot or cold intolerance, sweats, polyuria, polydipsia. Musculoskeletal: Denies myalgias, back pain, joint swelling, arthralgias and gait problem.  Skin: Denies pallor, rash and wounds.  Neurological: Denies dizziness, seizures, syncope, weakness, lightheadedness, numbness and headaches.  Hematological: Denies adenopathy,easy bruising, personal or family bleeding history.  Psychiatric/Behavioral: Denies  mood changes, confusion, nervousness, sleep disturbance and agitation.  Objective:  Physical Exam: Filed Vitals:   04/21/13 1552  BP: 153/88  Pulse: 90  Temp: 97.2 F (36.2 C)  TempSrc: Oral  Weight: 181 lb 9.6 oz (82.373 kg)  SpO2: 97%   General: Vital signs reviewed.  Patient is a well-developed and well-nourished, in no acute distress and cooperative with exam. Alert and oriented x3.  Head: Normocephalic and atraumatic. Nose: No erythema or drainage noted.  Turbinates normal. Eyes: PERRL, EOMI, conjunctivae normal, No scleral icterus.  Neck: Supple, trachea midline, normal ROM, No JVD, masses, thyromegaly, or carotid bruit present.  Cardiovascular:  RRR, S1 normal, S2 normal, no murmurs, gallops, or rubs. Pulmonary/Chest: normal respiratory effort, CTAB, no wheezes, rales, or rhonchi. Abdominal: Soft. Non-tender, non-distended, bowel sounds are normal, no masses, organomegaly, or guarding present.  Musculoskeletal: No joint deformities, erythema, or stiffness, ROM full and no nontender. Extremities: Cystic structure present inferior to the left patella, 4cm x 5cm x 7cm. It is firm, mobile, smooth, no erythema, or tenderness. No swelling or edema,  pulses symmetric and intact bilaterally. No cyanosis or clubbing.  Neurological: A&O x3, Strength is normal and symmetric bilaterally, cranial nerve II-XII are grossly intact, no focal motor deficit, sensory intact to light touch bilaterally.  Skin: Warm, dry and intact. No rashes or erythema. Psychiatric: Normal mood and affect. speech and behavior is normal. Cognition and memory are normal.   Assessment & Plan:   Please see problem-based assessment and plan.

## 2013-04-22 ENCOUNTER — Encounter: Payer: Self-pay | Admitting: Internal Medicine

## 2013-04-22 NOTE — Assessment & Plan Note (Signed)
Large mass on left shin, just below the knee that has been present for 10-12 years, but he said he is now "ready to get rid of it". He denies any recent change in size, pain, irritation, or redness. On examination, the mass resembles a cystic structure, about 4cm x 5cm x 7cm, and is firm, smooth, no erythema, or tenderness. It is also mobile, not adhering to the patella or underlying tibia, but is more firm than a lipoma.  -Gave patient a referral for general surgery as it appears the mass will most likely need to be removed rather than drained, given its size.

## 2013-04-22 NOTE — Assessment & Plan Note (Signed)
Given patient's age, blood pressure is near goal at this time. Claims he is compliant with his Lisinopril and HCTZ. No intervention today.

## 2013-04-22 NOTE — Assessment & Plan Note (Signed)
Patient admitted in 11/2102 for significant right sided CVA. No further left sided residual weakness. Patient claims he has not had any recent issues with motor function, sensory losses, gait, speech, or swallowing.

## 2013-04-22 NOTE — Assessment & Plan Note (Signed)
Follows with [facility] (Dr. Brunilda Payor). Will follow up there later this month. Patient claims he has been diagnosed with Prostate CA, however, the only urology note seen does not state this officially. Patient denies any urinary symptoms.

## 2013-05-01 ENCOUNTER — Encounter (INDEPENDENT_AMBULATORY_CARE_PROVIDER_SITE_OTHER): Payer: Self-pay | Admitting: General Surgery

## 2013-05-01 ENCOUNTER — Ambulatory Visit (INDEPENDENT_AMBULATORY_CARE_PROVIDER_SITE_OTHER): Payer: Medicaid Other | Admitting: General Surgery

## 2013-05-01 VITALS — BP 140/98 | HR 80 | Temp 97.5°F | Resp 18 | Ht 68.0 in | Wt 181.4 lb

## 2013-05-01 DIAGNOSIS — R2242 Localized swelling, mass and lump, left lower limb: Secondary | ICD-10-CM

## 2013-05-01 DIAGNOSIS — R29898 Other symptoms and signs involving the musculoskeletal system: Secondary | ICD-10-CM

## 2013-05-01 NOTE — Progress Notes (Signed)
Subjective:     Patient ID: [name], male   DOB: [birth year], 62 y.o.   MRN: [id number]  HPI The patient is a 62 year old male who is referred secondary to a left knee mass. The patient states it has been there for approximately 10 years. He states it's got an bigger over that time period the patient used to work previously as a Radiation protection practitioner. The patient has no symptomatology from this, but is interested in having it removed at this time. He states it has not been imaged in the past.  Review of Systems  Constitutional: Negative.   HENT: Negative.   Respiratory: Negative.   Cardiovascular: Negative.   Gastrointestinal: Negative.   Genitourinary: Negative.   Neurological: Negative.   All other systems reviewed and are negative.       Objective:   Physical Exam  Constitutional: He is oriented to person, place, and time. He appears well-developed and well-nourished.  HENT:  Head: Normocephalic and atraumatic.  Eyes: Conjunctivae and EOM are normal. Pupils are equal, round, and reactive to light.  Neck: Normal range of motion. Neck supple.  Cardiovascular: Normal rate, regular rhythm and normal heart sounds.   Pulmonary/Chest: Effort normal and breath sounds normal.  Abdominal: Soft. Bowel sounds are normal.  Musculoskeletal: Normal range of motion.       Left knee: He exhibits normal range of motion.       Legs: Neurological: He is alert and oriented to person, place, and time.       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     Plan:     1. Probably secondary to the location and approximated to the left knee joint The patient referred to orthopedics to further evaluate and possibly remove the left mass. I'm concerned that the mass could potentially to acute onset left knee joint. 2. Patient follow up as needed

## 2013-05-01 NOTE — Addendum Note (Signed)
Addended by: June Leap on: 05/01/2013 02:08 PM   Modules accepted: Orders

## 2013-05-03 ENCOUNTER — Telehealth (INDEPENDENT_AMBULATORY_CARE_PROVIDER_SITE_OTHER): Payer: Self-pay | Admitting: General Surgery

## 2013-05-03 NOTE — Telephone Encounter (Signed)
Spoke with pt and informed him that we have him set up for an appt with Dr. Penni Bombard with [facility] located at [address] on 05/18/13 at 8:00.  Explained to patient that his PCP listed on his medicaid card is not correct.  He explained that he went to the [facility] office last week and had it updated but that he has not received the new card in the mail.  Informed me that his correct PCP is Lars Masson, after calling their office they provided the NPI# of [id number] for his visit to the orthopedist specialist.

## 2013-06-07 ENCOUNTER — Other Ambulatory Visit: Payer: Self-pay | Admitting: *Deleted

## 2013-06-07 DIAGNOSIS — I10 Essential (primary) hypertension: Secondary | ICD-10-CM

## 2013-06-07 MED ORDER — HYDROCHLOROTHIAZIDE 25 MG PO TABS: 25.0000 mg | ORAL_TABLET | Freq: Every day | ORAL | Status: DC

## 2013-06-07 NOTE — Telephone Encounter (Signed)
Message left for pt that he has plenty of refills on Plavix,Lisinopril and Albuterol inhaler, which he also requested.

## 2013-06-07 NOTE — Telephone Encounter (Signed)
HCTZ rx called to [facility].

## 2013-06-08 ENCOUNTER — Other Ambulatory Visit: Payer: Self-pay | Admitting: *Deleted

## 2013-06-08 MED ORDER — ATORVASTATIN CALCIUM 80 MG PO TABS: 80.0000 mg | ORAL_TABLET | Freq: Every day | ORAL | Status: DC

## 2013-06-09 NOTE — Telephone Encounter (Signed)
Faxed in

## 2013-06-19 ENCOUNTER — Encounter: Payer: Self-pay | Admitting: Internal Medicine

## 2013-06-19 ENCOUNTER — Ambulatory Visit (INDEPENDENT_AMBULATORY_CARE_PROVIDER_SITE_OTHER): Payer: Medicaid Other | Admitting: Internal Medicine

## 2013-06-19 VITALS — BP 138/84 | HR 91 | Temp 97.4°F | Wt 187.0 lb

## 2013-06-19 DIAGNOSIS — I6340 Cerebral infarction due to embolism of unspecified cerebral artery: Secondary | ICD-10-CM

## 2013-06-19 DIAGNOSIS — J029 Acute pharyngitis, unspecified: Secondary | ICD-10-CM

## 2013-06-19 DIAGNOSIS — C61 Malignant neoplasm of prostate: Secondary | ICD-10-CM | POA: Insufficient documentation

## 2013-06-19 DIAGNOSIS — F17200 Nicotine dependence, unspecified, uncomplicated: Secondary | ICD-10-CM

## 2013-06-19 DIAGNOSIS — I10 Essential (primary) hypertension: Secondary | ICD-10-CM

## 2013-06-19 NOTE — Patient Instructions (Signed)
General Instructions:  Please try salt walter gargle's up to three times a day and throat lozenges to see if they will help your sore throat.   You can also try mucinex for the phlegm, which can be found over the counter  If your symptoms do not improve or get worse, or you start having fever, or chills, worsening cough, or pain when swallowing, call us right away and let us know [phone]  Follow up with Dr. Brunilda Payor as scheduled  Treatment Goals:  Goals (1 Years of Data) as of 06/19/13         As of Today 05/01/13 04/21/13 01/24/13 01/02/13     Blood Pressure    . Blood Pressure < 140/90  138/84 140/98 153/88 146/79 128/77     Lifestyle    . Quit smoking / using tobacco           Result Component    . LDL CALC < 100            Progress Toward Treatment Goals:  Treatment Goal 06/19/2013  Blood pressure at goal  Stop smoking smoking the same amount    Self Care Goals & Plans:  Self Care Goal 06/19/2013  Manage my medications take my medicines as prescribed; bring my medications to every visit; refill my medications on time  Monitor my health bring my blood pressure log to each visit; keep track of my blood pressure  Eat healthy foods eat foods that are low in salt; eat baked foods instead of fried foods  Be physically active find an activity I enjoy  Stop smoking go to the [employer] ([employer])    No flowsheet data found.   Care Management & Community Referrals:  Referral 11/07/2012  Referrals made for care management support none needed      Viral and Bacterial Pharyngitis Pharyngitis is a sore throat. It is an infection of the back of the throat (pharynx). HOME CARE   Only take medicine as told by your doctor. You may get sick again if you do not take medicine as told.  Drink enough fluids to keep your pee (urine) clear or pale yellow.  Rest.  Rinse your mouth (gargle) with salt water ( teaspoon of salt in 8 ounces of water) every 1 to 2  hours. This will help the pain.  For children over the age of 7, suck on hard candy or sore throat lozenges. GET HELP RIGHT AWAY IF:   There are large, tender lumps in your neck.  You have a rash.  You cough up green, yellow-brown, or bloody mucus.  You have a stiff neck.  There is redness, puffiness (swelling), or very bad pain anywhere on the neck.  You drool or are unable to swallow liquids.  You throw up (vomit) or are not able to keep medicine or liquids down.  You have very bad pain that will not stop with medicine.  You have problems breathing (not from a stuffy nose).  You cannot open your mouth completely.  You or your child has a temperature by mouth above 102 F (38.9 C), not controlled by medicine.  Your baby is older than 3 months with a rectal temperature of 102 F (38.9 C) or higher.  Your baby is 80 months old or younger with a rectal temperature of 100.4 F (38 C) or higher. MAKE SURE YOU:   Understand these instructions.  Will watch this condition.  Will get help right away if you or  your child is not doing well or gets worse. Document Released: 01/13/2008 Document Revised: 10/19/2011 Document Reviewed: 08/26/2009 [facility] Patient Information 2014 [place], [place].  Salt Water Gargle This solution will help make your mouth and throat feel better. HOME CARE INSTRUCTIONS   Mix 1 teaspoon of salt in 8 ounces of warm water.  Gargle with this solution as much or often as you need or as directed. Swish and gargle gently if you have any sores or wounds in your mouth.  Do not swallow this mixture. Document Released: 04/30/2004 Document Revised: 10/19/2011 Document Reviewed: 09/21/2008 [facility] Patient Information 2014 [place], [place].

## 2013-06-19 NOTE — Assessment & Plan Note (Signed)
BP Readings from Last 3 Encounters:  06/19/13 138/84  05/01/13 140/98  04/21/13 153/88   Lab Results  Component Value Date   NA 140 12/22/2012   K 4.2 12/22/2012   CREATININE 0.91 12/22/2012    Assessment: Blood pressure control: controlled Progress toward BP goal:  at goal  Plan: Medications:  continue current medications hctz 25mg  and lisinopril 10mg  qd Educational resources provided: brochure Self management tools provided: home blood pressure logbook

## 2013-06-19 NOTE — Assessment & Plan Note (Signed)
Apparently started on therapy with injections per patient in June for first treatment. Follow up appointment with Dr. Brunilda Payor scheduled for 11/19 for lab work and 11/26 for treatment at 945am. Tried to obtain records and verification of medication during visit today but kept on hold, unable to speak to anyone.   -follow up records--requested to be reviewed by pcp -per note from [facility] 01/19/13: start firmagon 1-2 weeks then lupron.

## 2013-06-19 NOTE — Assessment & Plan Note (Signed)
  Assessment: Progress toward smoking cessation:  smoking the same amount Barriers to progress toward smoking cessation:    Comments: still smoking ~1/2ppd with no intention to quit at this time  Plan: Instruction/counseling given:  I counseled patient on the dangers of tobacco use, advised patient to stop smoking, and reviewed strategies to maximize success. Educational resources provided:  [employer]) brochure Self management tools provided:  smoking cessation plan (STAR Quit Plan) Medications to assist with smoking cessation:  None Patient agreed to the following self-care plans for smoking cessation: go to the [employer] ([employer])  Other plans: recommended to quitline

## 2013-06-19 NOTE — Assessment & Plan Note (Signed)
Reports sore throat x3 weeks off and on, mainly noticed when swallowing. Chronic cough with white phlegm. No fever, chills, tonsillar exudates or tender adenopathy. Chronic smoker. Mild right sided nasal congestion complaints "feels swollen" but patent on physical exam today.   -supportive measures -salt water gargles -lozenges--reports improvement with taking them -if symptoms worsen, or notice change in phlegm, fever, or chills, among other symptoms advised to call or return to clinic

## 2013-06-19 NOTE — Progress Notes (Addendum)
Subjective:   Patient ID: [name] male   DOB: 1951/02/15 62 y.o.   MRN: [id number]  HPI: [name] is a 62 y.o. African American male with PMH of HTN, COPD, CVA, and suspicion for metastatic prostate CA presenting to opc today for routine follow up visit. He was last seen by his PCP, Dr. Yetta Pittman in September 2014 for complaints of left knee mass.  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 He says he was seen by ortho as scheduled and said they decided on no surgery at this time. We will request record for pcp to review.  Additionally, he endorses following up wit Dr. Brunilda Pittman in regards to his prostate cancer and per clinic note June 2014 he was to start [facility] and then switch to Lupron. He is not aware of what "shots" he receives at this time but says he has a follow up appointment on 06-28-13 for blood work and appointment on 07-05-13.  We will request records from there as well. I tried calling [facility] this morning to verify medications, however was kept on hold for at least 5-10 minute and unable to speak with anyone at the office.   His only complaint today is occasional sore throat, mainly noticed when he swallows. He says he has no trouble eating solid food or drinking and that it is felt most when his mouth is dry. Sore throat is also associated with feeling of his right nostril being blocked, although he is able to breathe from both sides today. He has not tried anything to help with his symptoms. He also endorses a chronic productive cough with clear sputum for at least 1 year and continues to smoke 1/2ppd since he was young.    Past Medical History  Diagnosis Date  . Hypertension 05/18/2007  . COPD 05/18/2007  . Elevated PSA 05/25/2007  . 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 Stroke 12/05/2012  . Cancer     prostate    Current Outpatient Prescriptions  Medication Sig Dispense Refill  . acetaminophen (TYLENOL) 325 MG tablet Take 1-2 tablets (325-650 mg total) by mouth every 4 (four) hours as needed.      Marland Kitchen albuterol (PROVENTIL HFA;VENTOLIN HFA) 108 (90 BASE) MCG/ACT inhaler Inhale 2 puffs into the lungs every 6 (six) hours as needed for wheezing.  8.5 g  11  . antiseptic oral rinse (BIOTENE) LIQD 15 mLs by Mouth Rinse route 2 times daily at 12 noon and 4 pm.      . atorvastatin (LIPITOR) 80 MG tablet Take 1 tablet (80 mg total) by mouth daily at 6 PM.      . atorvastatin (LIPITOR) 80 MG tablet Take 1 tablet (80 mg total) by mouth daily at 6 PM.  30 tablet  5  . baclofen (LIORESAL) 10 MG tablet Place 1 tablet (10 mg total) into feeding tube 3 (three) times daily.  30 each  0  . bisacodyl (DULCOLAX) 10 MG suppository Place 1 suppository (10 mg total) rectally daily as needed.  12 suppository  0  . chlorhexidine (PERIDEX) 0.12 % solution Use as directed 15 mLs in the mouth or throat 2 (two) times daily.      . cholecalciferol (VITAMIN D) 400 UNITS TABS Take 1,000 Units by mouth daily.      . clopidogrel (PLAVIX) 75 MG tablet Take 1  tablet (75 mg total) by mouth daily.  30 tablet  11  . hydrochlorothiazide (HYDRODIURIL) 25 MG tablet Take 1 tablet (25 mg total) by mouth daily.  30 tablet  5  . lisinopril (PRINIVIL,ZESTRIL) 10 MG tablet Take 1 tablet (10 mg total) by mouth daily.  30 tablet  11  . pantoprazole sodium (PROTONIX) 40 mg/20 mL PACK Place 20 mLs (40 mg total) into feeding tube 2 (two) times daily.  30 each    . potassium chloride 20 MEQ/15ML (10%) solution Place 15 mLs (20 mEq total) into feeding tube daily.  500 mL  0  . tiotropium (SPIRIVA HANDIHALER) 18 MCG inhalation capsule Place 1 capsule (18 mcg total) into inhaler and inhale daily.  30 capsule  11  . Water For Irrigation, Sterile (FREE WATER) SOLN Place 150 mLs into feeding tube 5 (five) times daily.       No current facility-administered  medications for this visit.   Family History  Problem Relation Age of Onset  . Liver disease Mother   . Heart attack Mother 55   History   Social History  . Marital Status: Single    Spouse Name: N/A    Number of Children: 0  . Years of Education: N/A   Occupational History  . Flooring instalation     Retired   Social History Main Topics  . Smoking status: Current Every Day Smoker -- 0.50 packs/day    Types: Cigarettes  . Smokeless tobacco: Never Used  . Alcohol Use: 7 - 10.5 oz/week    14-21 drink(s) per week     Comment: 2-3 beers per day  . Drug Use: No     Comment: history of cocaine use  . Sexual Activity: Not Currently   Other Topics Concern  . Not on file   Social History Narrative   Drinks 1/2 pt of wine daily and beer daily (at least 12 oz)   Denies recreational drugs    Smokes 3-4 cigarettes. 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    2 years of college at [employer] per patient          Review of Systems:  Constitutional:  Denies fever, chills, diaphoresis, appetite change and fatigue.   HEENT:  Sore throat, nasal congestion. Denies rhinorrhea, sneezing, mouth sores, trouble swallowing, neck pain   Respiratory:  Chronic productive cough. Denies SOB, DOE, and wheezing.   Cardiovascular:  Denies chest pain, palpitations, and leg swelling.   Gastrointestinal:  Denies nausea, vomiting, abdominal pain, diarrhea, constipation, blood in stool and abdominal distention.   Genitourinary:  Denies dysuria, urgency, frequency, hematuria, flank pain and difficulty urinating.   Musculoskeletal:  Denies myalgias, back pain, joint swelling, arthralgias and gait problem.   Skin:  Denies pallor, rash and wound.   Neurological:  Hx of cva. Denies dizziness, syncope, weakness, light-headedness, numbness and headaches.    Objective:  Physical Exam: Filed Vitals:   06/19/13 0908  BP: 138/84  Pulse: 91  Temp: 97.4 F (36.3  C)  TempSrc: Oral   Vitals reviewed. General: sitting in chair, NAD HEENT: PERRL, EOMI, erythematous nasal turbinates, no visible tonsillar exudates, no palpable cervical lymphadenopathy Cardiac: RRR, no rubs, murmurs or gallops Pulm: mild rhonchi b/l upon expiration, clears with cough, otherwise clear to auscultation bilaterally, no wheezes or rales Abd: soft, nontender, distended, BS present Ext: warm and well perfused, no pedal edema Neuro: alert and oriented X3, cranial nerves II-XII  grossly intact, strength and sensation to light touch equal in bilateral upper and lower extremities  Assessment & Plan:  Discussed with Dr. Josem Kaufmann [facility] for viral pharyngitis

## 2013-06-20 ENCOUNTER — Telehealth: Payer: Self-pay | Admitting: *Deleted

## 2013-06-20 NOTE — Progress Notes (Signed)
Case discussed with Dr. Qureshi at the time of the visit.  We reviewed the resident's history and exam and pertinent patient test results.  I agree with the assessment, diagnosis and plan of care documented in the resident's note. 

## 2013-06-20 NOTE — Telephone Encounter (Signed)
Pt called and left message to inquire about a med that was called in yesterday.   I returned call as I don't see that anything was ordered.  No answer, message left for pt to call back.

## 2013-07-13 ENCOUNTER — Other Ambulatory Visit: Payer: Self-pay | Admitting: *Deleted

## 2013-07-13 DIAGNOSIS — I10 Essential (primary) hypertension: Secondary | ICD-10-CM

## 2013-07-13 DIAGNOSIS — J449 Chronic obstructive pulmonary disease, unspecified: Secondary | ICD-10-CM

## 2013-07-13 DIAGNOSIS — Z8673 Personal history of transient ischemic attack (TIA), and cerebral infarction without residual deficits: Secondary | ICD-10-CM

## 2013-07-13 DIAGNOSIS — Z888 Allergy status to other drugs, medicaments and biological substances status: Secondary | ICD-10-CM

## 2013-07-13 NOTE — Telephone Encounter (Signed)
Call from pt- stated unable to get his meds despite having refills. Talked to [facility]; stated if MD who wrote rxs, is no longer in [employer]; Medicaid will not pay. These were prescribed by Dr Earlene Plater who is no longer here at the clinic.  Thanks

## 2013-07-14 MED ORDER — CLOPIDOGREL BISULFATE 75 MG PO TABS: 75.0000 mg | ORAL_TABLET | Freq: Every day | ORAL | Status: DC

## 2013-07-14 MED ORDER — ALBUTEROL SULFATE HFA 108 (90 BASE) MCG/ACT IN AERS: 2.0000 | INHALATION_SPRAY | Freq: Four times a day (QID) | RESPIRATORY_TRACT | Status: DC | PRN

## 2013-07-14 MED ORDER — TIOTROPIUM BROMIDE MONOHYDRATE 18 MCG IN CAPS: 18.0000 ug | ORAL_CAPSULE | Freq: Every day | RESPIRATORY_TRACT | Status: DC

## 2013-07-14 MED ORDER — LISINOPRIL 10 MG PO TABS: 10.0000 mg | ORAL_TABLET | Freq: Every day | ORAL | Status: DC

## 2013-08-10 HISTORY — PX: COLONOSCOPY W/ BIOPSIES: SHX1374

## 2013-08-18 ENCOUNTER — Telehealth: Payer: Self-pay | Admitting: Licensed Clinical Social Worker

## 2013-08-18 NOTE — Telephone Encounter (Signed)
CSW received request for PCS from [facility]. Call placed to [name] to determine exact needs.  Pt states he is in need of assistance getting in/out of tub for bathing and in/out of bed.  [name] also wanted it noted that he has COPD.  CSW informed [name] request will be sent to Dr. Eula Fried but pt may to schedule with PCP.  Pt aware.

## 2013-08-21 NOTE — Telephone Encounter (Signed)
Dr. Eula Fried requesting PCP to address need for PCS as ADL's were not discussed during most recent visit.

## 2013-08-21 NOTE — Telephone Encounter (Signed)
CSW placed call to Mr. Wyles. Pt notified it has been over 90 days since he had an appt with PCP.  CSW informed Mr. Cronkright to schedule and appt with PCP to discuss need for ADL assistance.  Pt states he will call today.

## 2013-10-10 ENCOUNTER — Ambulatory Visit (INDEPENDENT_AMBULATORY_CARE_PROVIDER_SITE_OTHER): Payer: Medicaid Other | Admitting: Internal Medicine

## 2013-10-10 ENCOUNTER — Encounter: Payer: Self-pay | Admitting: Physician Assistant

## 2013-10-10 ENCOUNTER — Encounter: Payer: Self-pay | Admitting: Internal Medicine

## 2013-10-10 VITALS — BP 155/92 | HR 90 | Temp 96.2°F | Ht 68.0 in | Wt 174.4 lb

## 2013-10-10 DIAGNOSIS — Z8673 Personal history of transient ischemic attack (TIA), and cerebral infarction without residual deficits: Secondary | ICD-10-CM

## 2013-10-10 DIAGNOSIS — I10 Essential (primary) hypertension: Secondary | ICD-10-CM

## 2013-10-10 DIAGNOSIS — Z888 Allergy status to other drugs, medicaments and biological substances status: Secondary | ICD-10-CM

## 2013-10-10 DIAGNOSIS — T39095A Adverse effect of salicylates, initial encounter: Secondary | ICD-10-CM

## 2013-10-10 DIAGNOSIS — J449 Chronic obstructive pulmonary disease, unspecified: Secondary | ICD-10-CM

## 2013-10-10 DIAGNOSIS — Z0000 Encounter for general adult medical examination without abnormal findings: Secondary | ICD-10-CM

## 2013-10-10 DIAGNOSIS — C61 Malignant neoplasm of prostate: Secondary | ICD-10-CM

## 2013-10-10 LAB — CBC
HEMATOCRIT: 36.7 % — AB (ref 39.0–52.0)
HEMOGLOBIN: 12.3 g/dL — AB (ref 13.0–17.0)
MCH: 28.2 pg (ref 26.0–34.0)
MCHC: 33.5 g/dL (ref 30.0–36.0)
MCV: 84.2 fL (ref 78.0–100.0)
Platelets: 319 10*3/uL (ref 150–400)
RBC: 4.36 MIL/uL (ref 4.22–5.81)
RDW: 15.5 % (ref 11.5–15.5)
WBC: 7.9 10*3/uL (ref 4.0–10.5)

## 2013-10-10 MED ORDER — ALBUTEROL SULFATE HFA 108 (90 BASE) MCG/ACT IN AERS
2.0000 | INHALATION_SPRAY | Freq: Four times a day (QID) | RESPIRATORY_TRACT | Status: DC | PRN
Start: 2013-10-10 — End: 2023-02-24

## 2013-10-10 MED ORDER — HYDROCHLOROTHIAZIDE 25 MG PO TABS: 25.0000 mg | ORAL_TABLET | Freq: Every day | ORAL | Status: DC

## 2013-10-10 MED ORDER — TIOTROPIUM BROMIDE MONOHYDRATE 18 MCG IN CAPS: 18.0000 ug | ORAL_CAPSULE | Freq: Every day | RESPIRATORY_TRACT | Status: DC

## 2013-10-10 MED ORDER — CLOPIDOGREL BISULFATE 75 MG PO TABS: 75.0000 mg | ORAL_TABLET | Freq: Every day | ORAL | Status: DC

## 2013-10-10 MED ORDER — LISINOPRIL 10 MG PO TABS: 10.0000 mg | ORAL_TABLET | Freq: Every day | ORAL | Status: DC

## 2013-10-10 MED ORDER — ATORVASTATIN CALCIUM 80 MG PO TABS: 80.0000 mg | ORAL_TABLET | Freq: Every day | ORAL | Status: DC

## 2013-10-10 NOTE — Assessment & Plan Note (Signed)
Patient denies SOB, wheezing, chest pain, or cough. He does not wish to quit smoking at this time, despite counseling during his clinic visits.  -Given refill for Albuterol + Spiriva.

## 2013-10-10 NOTE — Assessment & Plan Note (Signed)
Patient claims he is at baseline, but says he is having some weakness in his legs every once in a while. Walks safely with a cane. Has had some falls in the past, all of which he claims are never traumatic and never involve LOC. Patient interested in [facility], however, he does not seem to have issues with ADL's. He denies inability to bathe himself, use the bathroom, or dress himself. He does claim to have difficulties with IADL's based on his issues with weakness.  -Will discuss further options w/ CSW including assistance with transportation.

## 2013-10-10 NOTE — Progress Notes (Signed)
Patient ID: [name], male   DOB: 1951/01/12, 63 y.o.   MRN: [id number]   Subjective:   Patient ID: [name] male   DOB: 1950/12/03 63 y.o.   MRN: [id number]  HPI: TerrySorren P Pittman is a 63 y.o. M w/ PMHx of HTN, COPD, Prostate CA, and CVA (11/2012), presents to [facility] today for follow up. The patient says he is feeling well recently, but has had some issues w/ weakness, a chronic and stable issue for him. He has had a few falls recently, never traumatic in nature, no associated LOC or injury, simply 2/2 LE weakness. Otherwise, he has no new complaints. He recently ran out of his medications and has not taken his BP medications in 2 weeks or so. SBP 150's today. He claims he has had close follow up with Dr. Janice Norrie regarding his prostate and his most recent PSA (according to the patient) was <1. He denies any difficulty w/ urination, pain, or discharge. He as been receiving scheduled ?androgen deprivation injections for this and claims it has been working very well. No other complaints. No recent dizziness, lightheadedness, fever, chills, weight loss, abdominal pain, nausea or vomiting.  Mr. 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 No issues with this at this time.   Past Medical History  Diagnosis Date  . Hypertension 05/18/2007  . COPD 05/18/2007  . Elevated PSA 05/25/2007  . 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 Stroke 12/05/2012  . Cancer     prostate   Current Outpatient Prescriptions  Medication Sig Dispense Refill  . acetaminophen (TYLENOL) 325 MG tablet Take 1-2 tablets (325-650 mg total) by mouth every 4 (four) hours as needed.      Marland Kitchen albuterol (PROVENTIL HFA;VENTOLIN HFA) 108 (90 BASE) MCG/ACT inhaler Inhale 2 puffs into the lungs every 6 (six) hours as needed for wheezing.  8.5 g  3  . antiseptic oral rinse (BIOTENE) LIQD 15 mLs by Mouth Rinse  route 2 times daily at 12 noon and 4 pm.      . atorvastatin (LIPITOR) 80 MG tablet Take 1 tablet (80 mg total) by mouth daily at 6 PM.  30 tablet  5  . baclofen (LIORESAL) 10 MG tablet Place 1 tablet (10 mg total) into feeding tube 3 (three) times daily.  30 each  0  . chlorhexidine (PERIDEX) 0.12 % solution Use as directed 15 mLs in the mouth or throat 2 (two) times daily.      . cholecalciferol (VITAMIN D) 400 UNITS TABS Take 1,000 Units by mouth daily.      . clopidogrel (PLAVIX) 75 MG tablet Take 1 tablet (75 mg total) by mouth daily.  30 tablet  2  . hydrochlorothiazide (HYDRODIURIL) 25 MG tablet Take 1 tablet (25 mg total) by mouth daily.  30 tablet  5  . lisinopril (PRINIVIL,ZESTRIL) 10 MG tablet Take 1 tablet (10 mg total) by mouth daily.  30 tablet  5  . tiotropium (SPIRIVA HANDIHALER) 18 MCG inhalation capsule Place 1 capsule (18 mcg total) into inhaler and inhale daily.  30 capsule  5   No current facility-administered medications for this visit.   Family History  Problem Relation Age of Onset  . Liver disease Mother   . Heart attack Mother 1   History   Social History  .  Marital Status: Single    Spouse Name: N/A    Number of Children: 0  . Years of Education: N/A   Occupational History  . Flooring instalation     Retired   Social History Main Topics  . Smoking status: Current Every Day Smoker -- 0.50 packs/day    Types: Cigarettes  . Smokeless tobacco: Never Used     Comment: 1/2 ppd  . Alcohol Use: 7 - 10.5 oz/week    14-21 drink(s) per week     Comment: 2-3 beers per day  . Drug Use: No     Comment: history of cocaine use  . Sexual Activity: Not Currently   Other Topics Concern  . None   Social History Narrative   Drinks 1/2 pt of wine daily and beer daily (at least 12 oz)   Denies recreational drugs    Smokes 3-4 cigarettes. 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    2 years of college at [employer] per  patient          Review of Systems: General: Denies fever, chills, diaphoresis, appetite change and fatigue.  HEENT: Denies change in vision, eye pain, redness, hearing loss, congestion, sore throat, rhinorrhea, sneezing, mouth sores, trouble swallowing, neck pain, neck stiffness and tinnitus.   Respiratory: Denies SOB, DOE, cough, chest tightness, and wheezing.   Cardiovascular: Denies chest pain, palpitations and leg swelling.  Gastrointestinal: Denies nausea, vomiting, abdominal pain, diarrhea, constipation, blood in stool and abdominal distention.  Genitourinary: Denies dysuria, urgency, frequency, hematuria, flank pain and difficulty urinating.  Endocrine: Denies hot or cold intolerance, sweats, polyuria, polydipsia. Musculoskeletal: Denies myalgias, back pain, joint swelling, arthralgias and gait problem.  Skin: Denies pallor, rash and wounds.  Neurological: Positive for mild LE weakness (unchanged from baseline). Denies dizziness, seizures, syncope, lightheadedness, numbness and headaches.  Hematological: Denies adenopathy,easy bruising, personal or family bleeding history.  Psychiatric/Behavioral: Denies mood changes, confusion, nervousness, sleep disturbance and agitation.  Objective:  Physical Exam: Filed Vitals:   10/10/13 1411  BP: 155/92  Pulse: 90  Temp: 96.2 F (35.7 C)  TempSrc: Oral  Height: 5\' 8"  (1.727 m)  Weight: 174 lb 6.4 oz (79.107 kg)  SpO2: 99%  General: Vital signs reviewed.  Patient is a well-developed and well-nourished, in no acute distress and cooperative with exam. Alert and oriented x3.  Head: Normocephalic and atraumatic. Nose: No erythema or drainage noted.  Turbinates normal. Eyes: PERRL, EOMI, conjunctivae normal, No scleral icterus.  Neck: Supple, trachea midline, normal ROM, No JVD, masses, thyromegaly, or carotid bruit present.  Cardiovascular: RRR, S1 normal, S2 normal, no murmurs, gallops, or rubs. Pulmonary/Chest: normal respiratory  effort, CTAB, no wheezes, rales, or rhonchi. Abdominal: Soft. Non-tender, non-distended, bowel sounds are normal, no masses, organomegaly, or guarding present.  Musculoskeletal: No joint deformities, erythema, or stiffness, ROM full and no nontender. Extremities: Cystic structure present inferior to the left patella, 4cm x 5cm x 7cm. Firm, mobile, smooth, no erythema, or tenderness. Unchanged from prior exam. No swelling or edema,  pulses symmetric and intact bilaterally. No cyanosis or clubbing.  Neurological: A&O x3, Strength is normal and symmetric bilaterally, cranial nerve II-XII are grossly intact, no focal motor deficit, sensory intact to light touch bilaterally.  Skin: Warm, dry and intact. No rashes or erythema. Psychiatric: Normal mood and affect. speech and behavior is normal. Cognition and memory are normal.   Assessment & Plan:   Please see problem-based assessment and  plan.

## 2013-10-10 NOTE — Assessment & Plan Note (Signed)
BP elevated today to 155/92 as patient has not taken his medication in 2 weeks as he claims he has not had refills.  -Given refills for Lisinopril + HCTZ.  -Will see patient back in 2-3 months for BP check.

## 2013-10-10 NOTE — Assessment & Plan Note (Signed)
No issues at this time. Closely follows w/ Dr. Janice Norrie. Patient claims his PSA is <1. Will request further records from [facility].

## 2013-10-10 NOTE — Assessment & Plan Note (Signed)
Patient has never had a colonoscopy. Referral sent for colonoscopy as patient is agreeable.

## 2013-10-10 NOTE — Patient Instructions (Signed)
1. Please schedule a follow up appointment for ~3 months.  2. Please take all medications as prescribed.   3. If you have worsening of your symptoms or new symptoms arise, please call the clinic ([phone]), or go to the ER immediately if symptoms are severe.  You have done a great job in taking all your medications. I appreciate it very much. Please continue doing that.

## 2013-10-11 ENCOUNTER — Telehealth: Payer: Self-pay | Admitting: Licensed Clinical Social Worker

## 2013-10-11 LAB — BASIC METABOLIC PANEL
BUN: 13 mg/dL (ref 6–23)
CALCIUM: 10.4 mg/dL (ref 8.4–10.5)
CO2: 30 mEq/L (ref 19–32)
Chloride: 101 mEq/L (ref 96–112)
Creat: 1.01 mg/dL (ref 0.50–1.35)
GLUCOSE: 81 mg/dL (ref 70–99)
Potassium: 3.7 mEq/L (ref 3.5–5.3)
Sodium: 137 mEq/L (ref 135–145)

## 2013-10-11 NOTE — Telephone Encounter (Signed)
CSW was on hold for 1hr73min, as I was able to complete paperwork during hold time.  CSW needed to proceed with other work items.  Will attempt to call at a later time.

## 2013-10-11 NOTE — Telephone Encounter (Signed)
Mr. Resnik was referred to CSW as pt is having difficulty with transportation to medical appointment.  CSW discussed Medicaid Medical transportation through [place], pt was unaware but in agreement for CSW to attempt to arrange transportation for upcoming appointment.  CSW will schedule transportation if able, informed pt at times pt must contact their Medicaid caseworker directly.  Mr. Mckim was unaware he has a Banker.  CSW informed Mr. Mcquain, pt can call [phone], DSS to obtain name and number of his medicaid caseworker.

## 2013-10-12 NOTE — Telephone Encounter (Signed)
CSW mailed pt information on appointment and contact information to [facility].  CSW attempted to contact [employer] today via email and phone.  Was on hold for over 30 minutes today.  Placed call to Mr. Thornhill to see if pt can use the fixed bus route.  Pt states he has in the past.  CSW inquired if bus passes were provided would patient be able to make his appointment on 10/16/13.  Pt states he would.  CSW placed 2 one way bus passes in the mail.

## 2013-10-13 ENCOUNTER — Encounter (HOSPITAL_COMMUNITY): Payer: Self-pay | Admitting: Dentistry

## 2013-10-13 ENCOUNTER — Ambulatory Visit (HOSPITAL_COMMUNITY): Payer: Self-pay | Admitting: Dentistry

## 2013-10-13 VITALS — BP 191/107 | HR 72 | Temp 97.6°F

## 2013-10-13 DIAGNOSIS — K0530 Chronic periodontitis, unspecified: Secondary | ICD-10-CM

## 2013-10-13 DIAGNOSIS — K036 Deposits [accretions] on teeth: Secondary | ICD-10-CM

## 2013-10-13 DIAGNOSIS — K045 Chronic apical periodontitis: Secondary | ICD-10-CM

## 2013-10-13 DIAGNOSIS — K029 Dental caries, unspecified: Secondary | ICD-10-CM

## 2013-10-13 DIAGNOSIS — K08409 Partial loss of teeth, unspecified cause, unspecified class: Secondary | ICD-10-CM

## 2013-10-13 DIAGNOSIS — K083 Retained dental root: Secondary | ICD-10-CM

## 2013-10-13 DIAGNOSIS — IMO0002 Reserved for concepts with insufficient information to code with codable children: Secondary | ICD-10-CM

## 2013-10-13 DIAGNOSIS — Z0189 Encounter for other specified special examinations: Secondary | ICD-10-CM

## 2013-10-13 DIAGNOSIS — C61 Malignant neoplasm of prostate: Secondary | ICD-10-CM

## 2013-10-13 DIAGNOSIS — M264 Malocclusion, unspecified: Secondary | ICD-10-CM

## 2013-10-13 DIAGNOSIS — K0889 Other specified disorders of teeth and supporting structures: Secondary | ICD-10-CM

## 2013-10-13 NOTE — Telephone Encounter (Signed)
CSW secured transportation for [name] 10/16/13 appt at [facility].  Pick up time 9:10 -9:50am.  CSW placed called to pt.  CSW left message requesting return call. CSW provided contact hours and phone number.  [name] left message with CSW regarding need for PCS.  CSW left message encouraging pt to discuss with PCP.

## 2013-10-13 NOTE — Patient Instructions (Signed)
The patient was scheduled for removal of all remaining teeth with alveoloplasty and pre-prosthetic surgery as indicated in the operating room on 10/23/2013 at 11:30 AM at [facility]. The patient will then be admitted for 23 hour observation by the internal medicine residency program. Dr. Luanne Bras has been contacted and informed of this plan of care. Patient is to stop taking his clopidogrel Monday, 10/16/2013, to allow for dental extractions and 10/23/2013. The patient was provided a picture of the tablet that he needs to stop taking on Monday. The patient will be seen by presurgical testing at [facility] as per their protocol. The patient will then followup with a dentist of his choice for fabrication of upper lower complete dentures after adequate healing from the anticipated full mouth extractions. The patient is not to have any Xgeva therapy for approximately 2-3 months after the extractions to allow for healing to minimize the risk for osteonecrosis of the jaw. The patient is to call if questions or problems arise before then.  Dr. Enrique Sack

## 2013-10-13 NOTE — Progress Notes (Signed)
DENTAL CONSULTATION  Date of Consultation:  10/13/2013 Patient Name:   [name] Date of Birth:   20-Mar-1951 Medical Record Number: [id number]  VITALS: BP 191/107  Pulse 72  Temp(Src) 97.6 F (36.4 C) (Oral)   CHIEF COMPLAINT: The patient was referred by Dr. Janice Norrie for a pre-Xgeva dental protocol examination.  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 Patient with anticipated use of Xgeva by Dr. Janice Norrie. The patient is now seen as part of a medically necessary pre-Xgeva dental protocol examination to rule out dental infection that may affect the patient's systemic health as well to prevent future complications of osteonecrosis of the jaw related to the [facility] therapy.  The patient currently denies acute toothache, swellings, or abscesses. Patient has not seen a dentist for " a long, long time".  This is over 20-30 years by report.  Patient does not have any partial dentures. The patient knows that his teeth are "bad shape".   PROBLEM LIST: Patient Active Problem List   Diagnosis Date Noted  . Prostate cancer 06/19/2013    Priority: High  . Routine health maintenance 10/10/2013  . Viral pharyngitis 06/19/2013  . Localized swelling, mass, or lump of lower extremity 04/21/2013  . Hypertensive emergency 12/06/2012  . Stroke, acute, embolic [id number]  . Malignant hypertension 12/05/2012  . Cerebral embolism with cerebral infarction 12/05/2012  . Dyslipidemia 12/05/2012  . Alcohol abuse 12/05/2012  . Claudication, intermittent 11/07/2012  . Smokes tobacco daily 10/18/2012  . History of stroke 10/14/2010  . Elevated PSA 05/25/2007  . Hypertension 05/18/2007  . COPD 05/18/2007    PMH: Past Medical History  Diagnosis Date  . Hypertension 05/18/2007  . COPD 05/18/2007  . Elevated PSA 05/25/2007  . 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 Stroke 12/05/2012  . Prostate cancer     prostate     PSH: Past Surgical History  Procedure Laterality Date  . 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 Aspirin Hives  . Penicillins Hives    MEDICATIONS: Current Outpatient Prescriptions  Medication Sig Dispense Refill  . albuterol (PROVENTIL HFA;VENTOLIN HFA) 108 (90 BASE) MCG/ACT inhaler Inhale 2 puffs into the lungs every 6 (six) hours as needed for wheezing.  8.5 g  3  . atorvastatin (LIPITOR) 80 MG tablet Take 1 tablet (80 mg total) by mouth daily at 6 PM.  30 tablet  5  . hydrochlorothiazide (HYDRODIURIL) 25 MG tablet Take 1 tablet (25 mg total) by mouth daily.  30 tablet  5  . lisinopril (PRINIVIL,ZESTRIL) 10 MG tablet Take 1 tablet (10 mg total) by mouth daily.  30 tablet  5  . tiotropium (SPIRIVA HANDIHALER) 18 MCG inhalation capsule Place 1 capsule (18 mcg total) into inhaler and inhale daily.  30 capsule  5  . acetaminophen (TYLENOL) 325 MG tablet Take 1-2 tablets (325-650 mg total) by mouth every 4 (four) hours as needed.      . baclofen (LIORESAL) 10 MG tablet Place 1 tablet (10 mg total) into feeding tube 3 (three) times daily.  30 each  0  . cholecalciferol (VITAMIN D) 400 UNITS TABS Take 1,000 Units by mouth daily.      . clopidogrel (PLAVIX) 75 MG tablet Take 1 tablet (75 mg total) by mouth daily.  30 tablet  2   No current facility-administered medications for this visit.  LABS: Lab Results  Component Value Date   WBC 7.9 10/10/2013   HGB 12.3* 10/10/2013   HCT 36.7* 10/10/2013   MCV 84.2 10/10/2013   PLT 319 10/10/2013      Component Value Date/Time   NA 137 10/10/2013 1448   K 3.7 10/10/2013 1448   CL 101 10/10/2013 1448   CO2 30 10/10/2013 1448   GLUCOSE 81 10/10/2013 1448   BUN 13 10/10/2013 1448   CREATININE 1.01 10/10/2013 1448   CREATININE 0.91 12/22/2012 0545   CALCIUM 10.4 10/10/2013 1448   GFRNONAA 88* 12/22/2012 0545   GFRAA >90 12/22/2012 0545   Lab Results  Component Value Date   INR 1.04 12/21/2012   INR 0.99  12/05/2012   INR 0.92 10/14/2010   No results found for this basename: PTT    SOCIAL HISTORY: History   Social History  . Marital Status: Single    Spouse Name: N/A    Number of Children: 0  . Years of Education: N/A   Occupational History  . Flooring instalation     Retired   Social History Main Topics  . Smoking status: Current Every Day Smoker -- 0.50 packs/day    Types: Cigarettes  . Smokeless tobacco: Never Used     Comment: 1/2 ppd  . Alcohol Use: 7 - 10.5 oz/week    14-21 drink(s) per week     Comment: 2-3 beers per day  . Drug Use: No     Comment: history of cocaine use  . Sexual Activity: Not Currently   Other Topics Concern  . Not on file   Social History Narrative   Drinks 1/2 pt of wine daily and beer daily (at least 12 oz)   Denies recreational drugs    Smokes 3-4 cigarettes. 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    2 years of college at [employer] per patient           FAMILY HISTORY: Family History  Problem Relation Age of Onset  . Liver disease Mother   . Heart attack Mother 50     REVIEW OF SYSTEMS: Reviewed with patient and included in dental record.   DENTAL HISTORY: CHIEF COMPLAINT: The patient was referred by Dr. Janice Norrie for a pre-Xgeva dental protocol examination.  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 Patient with anticipated use of Xgeva by Dr. Janice Norrie. The patient is now seen as part of a medically necessary pre-Xgeva dental protocol examination to rule out dental infection that may affect the patient's systemic health as well to prevent future complications of osteonecrosis of the jaw related to the [facility] therapy.  The patient currently denies acute toothache, swellings, or abscesses. Patient is not seen a dentist for" a long, long time".  This is over 20-30 years by report.  Patient does not have any partial  dentures. The patient knows that his teeth are "bad shape".  DENTAL EXAMINATION:  GENERAL: The patient is a well-developed, well-nourished male in no acute distress. HEAD AND NECK: There is no palpable submandibular lymphadenopathy. The patient denies acute TMJ symptoms. INTRAORAL EXAM: Patient has xerostomia. There is no evidence of abscess formation within the mouth today. The patient has a deep palatal vault. Patient has a constricted V-shaped arch form. DENTITION: The patient is missing tooth numbers 8, 9, 10, 17, 18, 19, 25, 26, 30, and 31.  There multiple retained root segments in the area of tooth numbers 1, 2, 4, 5, 6, 7, 12, 13, 14. PERIODONTAL: Patient has chronic, advanced periodontal disease with plaque and calculus accumulations, generalized gingival recession, and moderate to severe bone loss. Patient has multiple mobile teeth. DENTAL CARIES/SUBOPTIMAL RESTORATIONS: Patient has extensive dental caries affecting most of the remaining dentition. ENDODONTIC: Patient currently denies acute pulpitis symptoms. Patient has multiple areas of periapical pathology and radiolucency noted. CROWN AND BRIDGE: There are no crown or bridge restorations. PROSTHODONTIC: The patient has no partial dentures. OCCLUSION: The patient has a poor occlusal scheme secondary to multiple missing teeth, multiple retained root segments, supra-eruption and drifting of the unopposed teeth into the edentulous areas, and lack of replacement of missing teeth with dental prostheses.  RADIOGRAPHIC INTERPRETATION: An orthopantogram was taken and supplemented with periapical radiographs. The patient was difficult to obtain. A radiograph secondary to poor patient cooperation and the malocclusion. There are multiple missing teeth. There are multiple retained root segments. There are multiple dental caries noted. There are multiple areas of periapical pathology and radiolucency. There is moderate to severe bone loss. There is  supra-eruption and drifting of the unopposed teeth into the edentulous areas. Patient appears to have excessive bilateral maxillary tuberosities.  ASSESSMENTS: 1. Prostate cancer with probable metastasis to bone. 2. PreDelton See dental protocol examination 3. Chronic apical periodontitis 4. Chronic periodontitis with bone loss 5. Plaque and calculus accumulations 6. Gingival recession 7. Tooth mobility 8. Multiple missing teeth 9. Multiple retained root segments 10. Rampant dental caries 11. Supra-eruption and drifting of the unopposed teeth into the edentulous areas 12. No history of partial dentures 13. Malocclusion 14. Plavix therapy with the risk for bleeding with invasive dental procedures 15. History of strokes with the potential for complications up to and including another stroke and death.  PLAN/RECOMMENDATIONS: 1. I discussed the risks, benefits, and complications of various treatment options with the patient in relationship to his medical and dental conditions, anticipated use of Xgeva with the risk for osteonecrosis of the jaw, and risk for bleeding due to the current clopidogrel/Plavix therapy. We discussed various treatment options to include no treatment, multiple extractions with alveoloplasty, pre-prosthetic surgery as indicated, periodontal therapy, dental restorations, root canal therapy, crown and bridge therapy, implant therapy, and replacement of missing teeth as indicated. The patient currently wishes to proceed with extraction of all remaining teeth with alveoloplasty and pre-prosthetic surgery as indicated in the operating room with general anesthesia. This has been scheduled for 10/23/2013 at 11:30 AM in the operating room with general anesthesia at [facility].  The patient is to discontinue clopidogrel/Plavix therapy starting 10/16/2013 to minimize the risk for bleeding with anticipated invasive dental procedures. Patient is aware of the potential for another  stroke while he is off of the clopidogrel/Plavix therapy.  Ideally the patient will be admitted by the internal medicine residency program for a 23 hour observation after the dental extractions on 10/23/13.  Patient will then follow the dentist of his choice for fabrication of upper and lower complete dentures after adequate healing from the dental extractions.  Ideally, the Xgeva therapy will be held for approximately 2-3 months after the dental extractions to minimize risks for osteonecrosis of the jaw with anticipated Xgeva therapy. Dr. Janice Norrie to consider start of Xgeva therapy earlier than that if clinically needed.   2. Discussion of findings with medical team and coordination of future medical and dental care as needed.  I spent 90 minutes face to  face with patient and more than 50% of time was spent in counseling and /or coordination of care.   Lenn Cal, DDS

## 2013-10-16 ENCOUNTER — Encounter: Payer: Self-pay | Admitting: Physician Assistant

## 2013-10-16 ENCOUNTER — Ambulatory Visit (INDEPENDENT_AMBULATORY_CARE_PROVIDER_SITE_OTHER): Payer: Medicaid Other | Admitting: Physician Assistant

## 2013-10-16 ENCOUNTER — Encounter (HOSPITAL_COMMUNITY): Payer: Self-pay | Admitting: Pharmacy Technician

## 2013-10-16 VITALS — BP 140/80 | HR 80 | Ht 68.0 in | Wt 173.4 lb

## 2013-10-16 DIAGNOSIS — Z1211 Encounter for screening for malignant neoplasm of colon: Secondary | ICD-10-CM

## 2013-10-16 DIAGNOSIS — D689 Coagulation defect, unspecified: Secondary | ICD-10-CM

## 2013-10-16 MED ORDER — MOVIPREP 100 G PO SOLR: 1.0000 | ORAL | Status: DC

## 2013-10-16 NOTE — Progress Notes (Addendum)
Subjective:    Patient ID: [name], male    DOB: Jul 29, 1951, 63 y.o.   MRN: [id number]  HPI [name] is a 63 year old African American male with history of hypertension, COPD, recurrent CVAs with last event April 2014 with prolonged hospitalization. This stroke was complicated by dysphagia which gradually improved. Patient also is prior history of EtOH abuse. He has recently been diagnosed with prostate cancer and is being followed by [name]  He is scheduled to undergo a complete dental extraction on 10/23/2013 prior to starting chemotherapy. He is referred here by [name] of the internal medicine clinic for colon cancer screening. Patient has not had any prior colonoscopy. He currently has no GI complaints, specifically no changes in his bowel habits, melena ,hematochezia,onstipation, or diarrhea. He denies any abdominal pain. He also denies any heartburn or indigestion or solid food dysphagia. Patient has been on chronic Plavix  Secondary to history of recurrent CVAs. He says his Plavix is on hold as of today for anticipated surgery next Monday. Family history is negative for GI diseases far as he is aware   Review of Systems  Constitutional: Negative.   HENT: Negative.   Eyes: Negative.   Respiratory: Negative.   Cardiovascular: Negative.   Endocrine: Negative.   Genitourinary: Negative.   Musculoskeletal: Positive for gait problem.  Allergic/Immunologic: Negative.   Neurological: Negative.   Hematological: Negative.   Psychiatric/Behavioral: Negative.    Outpatient Prescriptions Prior to Visit  Medication Sig Dispense Refill  . albuterol (PROVENTIL HFA;VENTOLIN HFA) 108 (90 BASE) MCG/ACT inhaler Inhale 2 puffs into the lungs every 6 (six) hours as needed for wheezing.  8.5 g  3  . atorvastatin (LIPITOR) 80 MG tablet Take 1 tablet (80 mg total) by mouth daily at 6 PM.  30 tablet  5  . clopidogrel (PLAVIX) 75 MG tablet Take 1 tablet (75 mg total) by mouth daily.  30  tablet  2  . hydrochlorothiazide (HYDRODIURIL) 25 MG tablet Take 1 tablet (25 mg total) by mouth daily.  30 tablet  5  . lisinopril (PRINIVIL,ZESTRIL) 10 MG tablet Take 1 tablet (10 mg total) by mouth daily.  30 tablet  5  . tiotropium (SPIRIVA HANDIHALER) 18 MCG inhalation capsule Place 1 capsule (18 mcg total) into inhaler and inhale daily.  30 capsule  5  . acetaminophen (TYLENOL) 325 MG tablet Take 1-2 tablets (325-650 mg total) by mouth every 4 (four) hours as needed.      . baclofen (LIORESAL) 10 MG tablet Place 1 tablet (10 mg total) into feeding tube 3 (three) times daily.  30 each  0  . cholecalciferol (VITAMIN D) 400 UNITS TABS Take 1,000 Units by mouth daily.       No facility-administered medications prior to visit.   Allergies  Allergen Reactions  . Aspirin Hives  . Penicillins Hives   Patient Active Problem List   Diagnosis Date Noted  . Routine health maintenance 10/10/2013  . Prostate cancer 06/19/2013  . Viral pharyngitis 06/19/2013  . Localized swelling, mass, or lump of lower extremity 04/21/2013  . Hypertensive emergency 12/06/2012  . Stroke, acute, embolic 52/77/8242  . Malignant hypertension 12/05/2012  . Cerebral embolism with cerebral infarction 12/05/2012  . Dyslipidemia 12/05/2012  . Alcohol abuse 12/05/2012  . Claudication, intermittent 11/07/2012  . Smokes tobacco daily 10/18/2012  . History of stroke 10/14/2010  . Elevated PSA 05/25/2007  . Hypertension 05/18/2007  . COPD 05/18/2007   History  Substance Use Topics  .  Smoking status: Current Every Day Smoker -- 0.50 packs/day for 50 years    Types: Cigarettes  . Smokeless tobacco: Never Used     Comment: 1/2 ppd, tobacco info given 10/16/13  . Alcohol Use: 7 - 10.5 oz/week    14-21 drink(s) per week     Comment: 2-3 beers per day   family history includes Heart attack (age of onset: 7) in his mother; Liver disease in his mother. There is no history of Colon cancer.     Objective:   Physical  Exam  well-developed older African American male in no acute distress blood pressure 140/80 pulse 80 height 5 foot 8 weight 173. HEENT; nontraumatic normocephalic EOMI PERRLA sclera anicteric, Supple; no JVD, Cardiovascular; regular rate and rhythm with S1-S2 no murmur or gallop, Pulmonary; clear bilaterally, Abdomen; soft nontender nondistended bowel sounds are active there is no palpable mass or hepatosplenomegaly, Rectal exam not done, Extremities ;no clubbing cyanosis or edema skin warm and dry he does ambulate with a cane, Psych; mood and affect appropriate        Assessment & Plan:  #24  63 year old [place] American male referred for colon neoplasia screening. Patient asymptomatic and average risk #2 chronic antiplatelet therapy with Plavix #3 history of recurrent CVAs #4 COPD #5 hypertension #6 recent diagnosis of prostate cancer patient to undergo complete dental extraction on 10/23/2013 prior to starting treatment for prostate cancer. Plavix is being held  prior to surgery.  Plan; Since patient is to be off his Plavix all this week it is an ideal time to proceed with screening colonoscopy. We were able to get him scheduled with Dr. Hilarie Pittman for colonoscopy on Friday, 10/20/2013. He will have been off his Plavix for 5 days. Procedure discussed in detail with the patient and he is agreeable to proceed.  Addendum: Reviewed and agree with management. Jerene Bears, MD

## 2013-10-16 NOTE — Patient Instructions (Signed)
You have been scheduled for a colonoscopy with propofol. Please follow written instructions given to you at your visit today.  Please pick up your prep kit at the pharmacy within the next 1-3 days. [employer].  If you use inhalers (even only as needed), please bring them with you on the day of your procedure. Your physician has requested that you go to www.startemmi.com and enter the access code given to you at your visit today. This web site gives a general overview about your procedure. However, you should still follow specific instructions given to you by our office regarding your preparation for the procedure.

## 2013-10-16 NOTE — Progress Notes (Signed)
Case discussed with Dr. Jones at the time of the visit.  We reviewed the resident's history and exam and pertinent patient test results.  I agree with the assessment, diagnosis, and plan of care documented in the resident's note. 

## 2013-10-19 ENCOUNTER — Inpatient Hospital Stay (HOSPITAL_COMMUNITY)
Admission: RE | Admit: 2013-10-19 | Discharge: 2013-10-19 | Disposition: A | Discharge status: Home / Self Care | Payer: Self-pay | Source: Ambulatory Visit

## 2013-10-19 ENCOUNTER — Encounter (HOSPITAL_COMMUNITY): Payer: Self-pay

## 2013-10-19 NOTE — Pre-Procedure Instructions (Signed)
[name]  10/19/2013   Your procedure is scheduled on:  Monday  10/23/13   Report to [facility]  2 * 3 at 930 AM.  Call this number if you have problems the morning of surgery: [phone]   Remember:   Do not eat food or drink liquids after midnight.   Take these medicines the morning of surgery with A SIP OF WATER:  ALBUTEROL INHALER, SPIRIVA   Do not wear jewelry, make-up or nail polish.  Do not wear lotions, powders, or perfumes. You may wear deodorant.  Do not shave 48 hours prior to surgery. Men may shave face and neck.  Do not bring valuables to the hospital.  [facility] is not responsible                  for any belongings or valuables.               Contacts, dentures or bridgework may not be worn into surgery.  Leave suitcase in the car. After surgery it may be brought to your room.  For patients admitted to the hospital, discharge time is determined by your                treatment team.               Patients discharged the day of surgery will not be allowed to drive  home.  Name and phone number of your driver:   Special Instructions:  Special Instructions: [facility] - Preparing for Surgery  Before surgery, you can play an important role.  Because skin is not sterile, your skin needs to be as free of germs as possible.  You can reduce the number of germs on you skin by washing with CHG (chlorahexidine gluconate) soap before surgery.  CHG is an antiseptic cleaner which kills germs and bonds with the skin to continue killing germs even after washing.  Please DO NOT use if you have an allergy to CHG or antibacterial soaps.  If your skin becomes reddened/irritated stop using the CHG and inform your nurse when you arrive at Short Stay.  Do not shave (including legs and underarms) for at least 48 hours prior to the first CHG shower.  You may shave your face.  Please follow these instructions carefully:   1.  Shower with CHG Soap the night  before surgery and the morning of Surgery.  2.  If you choose to wash your hair, wash your hair first as usual with your normal shampoo.  3.  After you shampoo, rinse your hair and body thoroughly to remove the Shampoo.  4.  Use CHG as you would any other liquid soap. You can apply chg directly to the skin and wash gently with scrungie or a clean washcloth.  5.  Apply the CHG Soap to your body ONLY FROM THE NECK DOWN.  Do not use on open wounds or open sores.  Avoid contact with your eyes, ears, mouth and genitals (private parts).  Wash genitals (private parts with your normal soap.  6.  Wash thoroughly, paying special attention to the area where your surgery will be performed.  7.  Thoroughly rinse your body with warm water from the neck down.  8.  DO NOT shower/wash with your normal soap after using and rinsing off the CHG Soap.  9.  Pat yourself dry with a clean towel.  10.  Wear clean pajamas.            11.  Place clean sheets on your bed the night of your first shower and do not sleep with pets.  Day of Surgery  Do not apply any lotions/deodorants the morning of surgery.  Please wear clean clothes to the hospital/surgery center.   Please read over the following fact sheets that you were given: Pain Booklet, Coughing and Deep Breathing, MRSA Information and Surgical Site Infection Prevention

## 2013-10-19 NOTE — Progress Notes (Signed)
Anesthesia Chart Review:  Patient is a 63 year old male scheduled for multiple teeth extractions with alveoloplasty and pre-prosthetic surgery as necessary on 10/23/13 by Dr. Enrique Sack.  Dr. Enrique Sack had asked for an anesthesia consult re: nasal intubation; however patient did not show up for his PAT visit.  (Dr. Enrique Sack aware.) Patient is scheduled for a colonoscopy tomorrow (Friday 10/20/13) and is doing the bowel prep today.  His surgery is for Monday, so he will have to be a same day work-up. A PAT RN did do a phone interview today and patient denied chest pain.  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 Urologist is Dr. Janice Norrie. Plavix is being held on 10/16/13 to minimize risk for bleeding.  Dr. Ritta Slot notes indicate that he will be admitted by the IM residency program for 23 hours observation post-operatively.  Echo on 12/06/12 showed: - Left ventricle: The cavity size was normal. There was moderate concentric hypertrophy. Systolic function was vigorous. The estimated ejection fraction was in the range of 65% to 70%. Wall motion was normal; there were no regional wall motion abnormalities. Doppler parameters are consistent with abnormal left ventricular relaxation (grade 1 diastolic dysfunction). The E/e' ratio is <10, suggesting normal LV filling pressure. - Mitral valve: Mildly thickened leaflets . No significant regurgitation. - Atrial septum: No defect or patent foramen ovale was identified. - Inferior vena cava: The vessel was normal in size; the respirophasic diameter changes were in the normal range (= 50%); findings are consistent with normal central venous pressure.  Carotid duplex on 12/06/12 showed: No significant extracranial carotid artery stenosis demonstrated. Vertebrals are patent with antegrade  flow.  EKG on 12/05/12 showed SR with first degree AVB, LVH with QRS widening and repolarization abnormality (versus lateral ischemia), LAD, septal infarct (age undetermined).  I think that overall, his EKG is stable since 10/14/10.    I reviewed above with anesthesiologist Dr. Tobias Alexander including abnormal EKGs.  Since EKG appears stable X 3 years then would plan to proceed if he remains asymptomatic from a CV standpoint and no other acute changes.  Additional pre-operative studies and face to face anesthesia evaluation to be done on the day of surgery.  [facility] Short Stay Center/Anesthesiology Phone [phone] 10/19/2013 5:11 PM

## 2013-10-20 ENCOUNTER — Encounter: Payer: Self-pay | Admitting: Internal Medicine

## 2013-10-20 ENCOUNTER — Ambulatory Visit (AMBULATORY_SURGERY_CENTER): Payer: Medicaid Other | Admitting: Internal Medicine

## 2013-10-20 VITALS — BP 224/37 | HR 72 | Temp 98.0°F | Resp 19 | Ht 68.0 in | Wt 173.0 lb

## 2013-10-20 DIAGNOSIS — D126 Benign neoplasm of colon, unspecified: Secondary | ICD-10-CM

## 2013-10-20 DIAGNOSIS — Z1211 Encounter for screening for malignant neoplasm of colon: Secondary | ICD-10-CM

## 2013-10-20 DIAGNOSIS — K635 Polyp of colon: Secondary | ICD-10-CM

## 2013-10-20 HISTORY — DX: Polyp of colon: K63.5

## 2013-10-20 MED ORDER — SODIUM CHLORIDE 0.9 % IV SOLN: 500.0000 mL | INTRAVENOUS | Status: DC

## 2013-10-20 NOTE — Progress Notes (Signed)
Called to room to assist during endoscopic procedure.  Patient ID and intended procedure confirmed with present staff. Received instructions for my participation in the procedure from the performing physician.  

## 2013-10-20 NOTE — Patient Instructions (Addendum)
YOU HAD AN ENDOSCOPIC PROCEDURE TODAY AT THE [facility]: Refer to the procedure report that was given to you for any specific questions about what was found during the examination.  If the procedure report does not answer your questions, please call your gastroenterologist to clarify.  If you requested that your care partner not be given the details of your procedure findings, then the procedure report has been included in a sealed envelope for you to review at your convenience later.  YOU SHOULD EXPECT: Some feelings of bloating in the abdomen. Passage of more gas than usual.  Walking can help get rid of the air that was put into your GI tract during the procedure and reduce the bloating. If you had a lower endoscopy (such as a colonoscopy or flexible sigmoidoscopy) you may notice spotting of blood in your stool or on the toilet paper. If you underwent a bowel prep for your procedure, then you may not have a normal bowel movement for a few days.  DIET: Your first meal following the procedure should be a light meal and then it is ok to progress to your normal diet.  A half-sandwich or bowl of soup is an example of a good first meal.  Heavy or fried foods are harder to digest and may make you feel nauseous or bloated.  Likewise meals heavy in dairy and vegetables can cause extra gas to form and this can also increase the bloating.  Drink plenty of fluids but you should avoid alcoholic beverages for 24 hours.  ACTIVITY: Your care partner should take you home directly after the procedure.  You should plan to take it easy, moving slowly for the rest of the day.  You can resume normal activity the day after the procedure however you should NOT DRIVE or use heavy machinery for 24 hours (because of the sedation medicines used during the test).    SYMPTOMS TO REPORT IMMEDIATELY: A gastroenterologist can be reached at any hour.  During normal business hours, 8:30 AM to 5:00 PM Monday through Friday,  call [phone].  After hours and on weekends, please call the GI answering service at [phone] who will take a message and have the physician on call contact you.   Following lower endoscopy (colonoscopy or flexible sigmoidoscopy):  Excessive amounts of blood in the stool  Significant tenderness or worsening of abdominal pains  Swelling of the abdomen that is new, acute  Fever of 100F or higher    FOLLOW UP: If any biopsies were taken you will be contacted by phone or by letter within the next 1-3 weeks.  Call your gastroenterologist if you have not heard about the biopsies in 3 weeks.  Our staff will call the home number listed on your records the next business day following your procedure to check on you and address any questions or concerns that you may have at that time regarding the information given to you following your procedure. This is a courtesy call and so if there is no answer at the home number and we have not heard from you through the emergency physician on call, we will assume that you have returned to your regular daily activities without incident.  SIGNATURES/CONFIDENTIALITY: You and/or your care partner have signed paperwork which will be entered into your electronic medical record.  These signatures attest to the fact that that the information above on your After Visit Summary has been reviewed and is understood.  Full responsibility of the confidentiality   of this discharge information lies with you and/or your care-partner.    INFORMATION ON POLYPS AND DIVERTICULOSIS AND HIGH FIBER DIET GIVEN TO YOU TODAY   PLAVIX ON HOLD FOR UPCOMING PROCEDURE ;THEREFORE RESUME AS INSTRUCTED ACCORDING TO HOW YOU HAVE BEEN INSTRUCTED FOR THAT

## 2013-10-20 NOTE — Op Note (Signed)
[place]  [address] [place], [address]   COLONOSCOPY PROCEDURE REPORT  PATIENT: [name], [name]  MR#: [id number] BIRTHDATE: 03/30/1951 , 62  yrs. old GENDER: Male ENDOSCOPIST: Jerene Bears, MD REFERRED BY: Luanne Bras, MD PROCEDURE DATE:  10/20/2013 PROCEDURE:   Colonoscopy with snare polypectomy First Screening Colonoscopy - Avg.  risk and is 50 yrs.  old or older Yes.  Prior Negative Screening - Now for repeat screening. N/A  History of Adenoma - Now for follow-up colonoscopy & has been > or = to 3 yrs.  N/A  Polyps Removed Today? Yes. ASA CLASS:   Class III INDICATIONS:average risk screening and first colonoscopy. MEDICATIONS: MAC sedation, administered by CRNA and propofol (Diprivan) 200mg  IV  DESCRIPTION OF PROCEDURE:   After the risks benefits and alternatives of the procedure were thoroughly explained, informed consent was obtained.  A digital rectal exam revealed no rectal mass.   The LB [id number]  endoscope was introduced through the anus and advanced to the cecum, which was identified by both the appendix and ileocecal valve. No adverse events experienced. The quality of the prep was good, using MoviPrep  The instrument was then slowly withdrawn as the colon was fully examined.   COLON FINDINGS: A sessile polyp measuring 5-6 mm in size was found at the ileocecal valve.  A polypectomy was performed with a cold snare.  The resection was complete and the polyp tissue was completely retrieved.   Two pedunculated polyps measuring 5-6 mm in size were found in the transverse colon.  Polypectomy was performed using hot snare.  All resections were complete and all polyp tissue was completely retrieved.   A sessile polyp measuring 4 mm in size was found in the distal transverse colon.  A polypectomy was performed with a cold snare.  The resection was complete and the polyp tissue was completely retrieved.   There was moderate diverticulosis  noted in the ascending colon, at the hepatic flexure, in the descending colon, and sigmoid colon with associated muscular hypertrophy.   Medium sized lipoma was found at the hepatic flexure.  Retroflexed views revealed moderate to large internal hemorrhoids. The time to cecum=3 minutes 03 seconds. Withdrawal time=12 minutes 04 seconds.  The scope was withdrawn and the procedure completed.  COMPLICATIONS: There were no complications  ENDOSCOPIC IMPRESSION: 1.   Sessile polyp measuring 5-6 mm in size was found at the ileocecal valve; polypectomy was performed with a cold snare 2.   Two pedunculated polyps measuring 5-6 mm in size were found in the transverse colon; Polypectomy was performed using hot snare 3.   Sessile polyp measuring 4 mm in size was found in the distal transverse colon; polypectomy was performed with a cold snare 4.   There was moderate diverticulosis noted in the ascending colon, at the hepatic flexure, in the descending colon, and sigmoid colon 5.   Medium sized lipoma at the hepatic flexure  RECOMMENDATIONS: 1.  Await pathology results 2.  If the polyps removed today are proven to be adenomatous (pre-cancerous) polyps, you will need a colonoscopy in 3 years. Otherwise you should continue to follow colorectal cancer screening guidelines for "routine risk" patients with a colonoscopy in 10 years.  You will receive a letter within 1-2 weeks with the results of your biopsy as well as final recommendations.  Please call my office if you have not received a letter after 3 weeks. 3.  High fiber diet 4.  Plavix on hold for  upcoming dental procedure.  This can be restarted per dental recommendation   eSigned:  Jerene Bears, MD 10/20/2013 10:58 AM   cc: The Patient; Luanne Bras, MD   PATIENT NAME:  [name], [name] MR#: [id number]

## 2013-10-22 MED ORDER — CLINDAMYCIN PHOSPHATE 900 MG/50ML IV SOLN
900.0000 mg | Freq: Once | INTRAVENOUS | Status: AC
  Administered 2013-10-23: 900 mg via INTRAVENOUS
  Filled 2013-10-22: qty 50

## 2013-10-23 ENCOUNTER — Observation Stay (HOSPITAL_COMMUNITY)
Admission: RE | Admit: 2013-10-23 | Discharge: 2013-10-24 | Disposition: A | Discharge status: Home / Self Care | Payer: Medicaid Other | Source: Ambulatory Visit | Attending: Internal Medicine | Admitting: Internal Medicine

## 2013-10-23 ENCOUNTER — Ambulatory Visit (HOSPITAL_COMMUNITY): Payer: Medicaid Other | Admitting: Certified Registered Nurse Anesthetist

## 2013-10-23 ENCOUNTER — Telehealth: Payer: Self-pay | Admitting: *Deleted

## 2013-10-23 ENCOUNTER — Encounter (HOSPITAL_COMMUNITY): Payer: Self-pay | Admitting: *Deleted

## 2013-10-23 ENCOUNTER — Encounter (HOSPITAL_COMMUNITY): Payer: Medicaid Other | Admitting: Vascular Surgery

## 2013-10-23 ENCOUNTER — Encounter (HOSPITAL_COMMUNITY)
Admission: RE | Disposition: A | Discharge status: Home / Self Care | Payer: Self-pay | Source: Ambulatory Visit | Attending: Internal Medicine

## 2013-10-23 DIAGNOSIS — J4489 Other specified chronic obstructive pulmonary disease: Secondary | ICD-10-CM | POA: Insufficient documentation

## 2013-10-23 DIAGNOSIS — F17200 Nicotine dependence, unspecified, uncomplicated: Secondary | ICD-10-CM | POA: Insufficient documentation

## 2013-10-23 DIAGNOSIS — Z8673 Personal history of transient ischemic attack (TIA), and cerebral infarction without residual deficits: Secondary | ICD-10-CM | POA: Insufficient documentation

## 2013-10-23 DIAGNOSIS — I10 Essential (primary) hypertension: Secondary | ICD-10-CM | POA: Insufficient documentation

## 2013-10-23 DIAGNOSIS — M2607 Excessive tuberosity of jaw: Secondary | ICD-10-CM | POA: Diagnosis present

## 2013-10-23 DIAGNOSIS — K0530 Chronic periodontitis, unspecified: Secondary | ICD-10-CM | POA: Diagnosis present

## 2013-10-23 DIAGNOSIS — K08109 Complete loss of teeth, unspecified cause, unspecified class: Secondary | ICD-10-CM

## 2013-10-23 DIAGNOSIS — K083 Retained dental root: Secondary | ICD-10-CM | POA: Diagnosis present

## 2013-10-23 DIAGNOSIS — M278 Other specified diseases of jaws: Secondary | ICD-10-CM | POA: Insufficient documentation

## 2013-10-23 DIAGNOSIS — C61 Malignant neoplasm of prostate: Secondary | ICD-10-CM | POA: Insufficient documentation

## 2013-10-23 DIAGNOSIS — K029 Dental caries, unspecified: Secondary | ICD-10-CM

## 2013-10-23 DIAGNOSIS — K045 Chronic apical periodontitis: Secondary | ICD-10-CM

## 2013-10-23 DIAGNOSIS — J449 Chronic obstructive pulmonary disease, unspecified: Secondary | ICD-10-CM | POA: Insufficient documentation

## 2013-10-23 DIAGNOSIS — Z48815 Encounter for surgical aftercare following surgery on the digestive system: Secondary | ICD-10-CM

## 2013-10-23 DIAGNOSIS — Z418 Encounter for other procedures for purposes other than remedying health state: Secondary | ICD-10-CM | POA: Insufficient documentation

## 2013-10-23 DIAGNOSIS — IMO0002 Reserved for concepts with insufficient information to code with codable children: Secondary | ICD-10-CM

## 2013-10-23 DIAGNOSIS — Z2989 Encounter for other specified prophylactic measures: Secondary | ICD-10-CM | POA: Insufficient documentation

## 2013-10-23 DIAGNOSIS — Z48814 Encounter for surgical aftercare following surgery on the teeth or oral cavity: Secondary | ICD-10-CM

## 2013-10-23 HISTORY — PX: MULTIPLE EXTRACTIONS WITH ALVEOLOPLASTY: SHX5342

## 2013-10-23 LAB — BASIC METABOLIC PANEL
BUN: 9 mg/dL (ref 6–23)
CALCIUM: 9.8 mg/dL (ref 8.4–10.5)
CHLORIDE: 101 meq/L (ref 96–112)
CO2: 24 mEq/L (ref 19–32)
CREATININE: 0.95 mg/dL (ref 0.50–1.35)
GFR, EST NON AFRICAN AMERICAN: 86 mL/min — AB (ref >90)
Glucose, Bld: 102 mg/dL — ABNORMAL HIGH (ref 70–99)
Potassium: 3.7 mEq/L (ref 3.7–5.3)
Sodium: 139 mEq/L (ref 137–147)

## 2013-10-23 LAB — CBC WITH DIFFERENTIAL/PLATELET
Basophils Absolute: 0 10*3/uL (ref 0.0–0.1)
Basophils Relative: 0 % (ref 0–1)
Eosinophils Absolute: 0.1 10*3/uL (ref 0.0–0.7)
Eosinophils Relative: 1 % (ref 0–5)
HCT: 36.5 % — ABNORMAL LOW (ref 39.0–52.0)
Hemoglobin: 12.2 g/dL — ABNORMAL LOW (ref 13.0–17.0)
LYMPHS PCT: 44 % (ref 12–46)
Lymphs Abs: 3.6 10*3/uL (ref 0.7–4.0)
MCH: 28.7 pg (ref 26.0–34.0)
MCHC: 33.4 g/dL (ref 30.0–36.0)
MCV: 85.9 fL (ref 78.0–100.0)
Monocytes Absolute: 0.9 10*3/uL (ref 0.1–1.0)
Monocytes Relative: 11 % (ref 3–12)
NEUTROS ABS: 3.7 10*3/uL (ref 1.7–7.7)
NEUTROS PCT: 45 % (ref 43–77)
PLATELETS: 290 10*3/uL (ref 150–400)
RBC: 4.25 MIL/uL (ref 4.22–5.81)
RDW: 14.6 % (ref 11.5–15.5)
WBC: 8.3 10*3/uL (ref 4.0–10.5)

## 2013-10-23 SURGERY — MULTIPLE EXTRACTION WITH ALVEOLOPLASTY
Anesthesia: General | Site: Mouth

## 2013-10-23 MED ORDER — ALBUTEROL SULFATE HFA 108 (90 BASE) MCG/ACT IN AERS: 2.0000 | INHALATION_SPRAY | Freq: Four times a day (QID) | RESPIRATORY_TRACT | Status: DC | PRN

## 2013-10-23 MED ORDER — SODIUM CHLORIDE 0.9 % IV SOLN: 250.0000 mL | INTRAVENOUS | Status: DC | PRN

## 2013-10-23 MED ORDER — PROPOFOL 10 MG/ML IV BOLUS
INTRAVENOUS | Status: AC
  Filled 2013-10-23: qty 20

## 2013-10-23 MED ORDER — FENTANYL CITRATE 0.05 MG/ML IJ SOLN
INTRAMUSCULAR | Status: DC | PRN
  Administered 2013-10-23 (×3): 50 ug via INTRAVENOUS

## 2013-10-23 MED ORDER — LIDOCAINE-EPINEPHRINE 2 %-1:100000 IJ SOLN
INTRAMUSCULAR | Status: AC
  Filled 2013-10-23: qty 10.2

## 2013-10-23 MED ORDER — EPHEDRINE SULFATE 50 MG/ML IJ SOLN
INTRAMUSCULAR | Status: AC
  Filled 2013-10-23: qty 1

## 2013-10-23 MED ORDER — SODIUM CHLORIDE 0.9 % IJ SOLN: 3.0000 mL | Freq: Two times a day (BID) | INTRAMUSCULAR | Status: DC

## 2013-10-23 MED ORDER — OXYMETAZOLINE HCL 0.05 % NA SOLN
NASAL | Status: AC
  Filled 2013-10-23: qty 15

## 2013-10-23 MED ORDER — 0.9 % SODIUM CHLORIDE (POUR BTL) OPTIME
TOPICAL | Status: DC | PRN
  Administered 2013-10-23: 1000 mL

## 2013-10-23 MED ORDER — ONDANSETRON HCL 4 MG/2ML IJ SOLN: 4.0000 mg | Freq: Four times a day (QID) | INTRAMUSCULAR | Status: DC | PRN

## 2013-10-23 MED ORDER — BUPIVACAINE-EPINEPHRINE 0.5% -1:200000 IJ SOLN
INTRAMUSCULAR | Status: DC | PRN
  Administered 2013-10-23: 1.8 mL

## 2013-10-23 MED ORDER — LABETALOL HCL 5 MG/ML IV SOLN
INTRAVENOUS | Status: AC
  Administered 2013-10-23: 5 mg via INTRAVENOUS
  Filled 2013-10-23: qty 4

## 2013-10-23 MED ORDER — FENTANYL CITRATE 0.05 MG/ML IJ SOLN
INTRAMUSCULAR | Status: AC
  Filled 2013-10-23: qty 5

## 2013-10-23 MED ORDER — SODIUM CHLORIDE 0.9 % IJ SOLN
INTRAMUSCULAR | Status: AC
  Filled 2013-10-23: qty 10

## 2013-10-23 MED ORDER — HYDRALAZINE HCL 20 MG/ML IJ SOLN
5.0000 mg | Freq: Four times a day (QID) | INTRAMUSCULAR | Status: DC | PRN
  Administered 2013-10-23: 5 mg via INTRAVENOUS
  Filled 2013-10-23: qty 1

## 2013-10-23 MED ORDER — HEMOSTATIC AGENTS (NO CHARGE) OPTIME
TOPICAL | Status: DC | PRN
  Administered 2013-10-23: 1 via TOPICAL

## 2013-10-23 MED ORDER — TIOTROPIUM BROMIDE MONOHYDRATE 18 MCG IN CAPS
18.0000 ug | ORAL_CAPSULE | Freq: Every day | RESPIRATORY_TRACT | Status: DC
  Filled 2013-10-23: qty 5

## 2013-10-23 MED ORDER — GLYCOPYRROLATE 0.2 MG/ML IJ SOLN
INTRAMUSCULAR | Status: AC
  Filled 2013-10-23: qty 3

## 2013-10-23 MED ORDER — HYDRALAZINE HCL 20 MG/ML IJ SOLN
10.0000 mg | INTRAMUSCULAR | Status: DC | PRN
  Administered 2013-10-23: 10 mg via INTRAVENOUS

## 2013-10-23 MED ORDER — LACTATED RINGERS IV SOLN
INTRAVENOUS | Status: DC | PRN
  Administered 2013-10-23: 12:00:00 via INTRAVENOUS

## 2013-10-23 MED ORDER — ONDANSETRON HCL 4 MG/2ML IJ SOLN: 4.0000 mg | Freq: Once | INTRAMUSCULAR | Status: DC | PRN

## 2013-10-23 MED ORDER — OXYCODONE-ACETAMINOPHEN 5-325 MG PO TABS
1.0000 | ORAL_TABLET | ORAL | Status: DC | PRN
  Administered 2013-10-24: 2 via ORAL
  Filled 2013-10-23: qty 2

## 2013-10-23 MED ORDER — LIDOCAINE HCL (CARDIAC) 20 MG/ML IV SOLN
INTRAVENOUS | Status: DC | PRN
  Administered 2013-10-23: 100 mg via INTRAVENOUS

## 2013-10-23 MED ORDER — LABETALOL HCL 5 MG/ML IV SOLN
5.0000 mg | INTRAVENOUS | Status: AC | PRN
  Administered 2013-10-23 (×4): 5 mg via INTRAVENOUS

## 2013-10-23 MED ORDER — MORPHINE SULFATE 2 MG/ML IJ SOLN
2.0000 mg | INTRAMUSCULAR | Status: DC | PRN
  Administered 2013-10-23 – 2013-10-24 (×3): 2 mg via INTRAVENOUS
  Filled 2013-10-23 (×3): qty 1

## 2013-10-23 MED ORDER — ROCURONIUM BROMIDE 100 MG/10ML IV SOLN
INTRAVENOUS | Status: DC | PRN
  Administered 2013-10-23: 50 mg via INTRAVENOUS

## 2013-10-23 MED ORDER — AMINOCAPROIC ACID SOLUTION 5% (50 MG/ML)
5.0000 mL | ORAL | Status: AC
  Administered 2013-10-23: 5 mL via ORAL
  Filled 2013-10-23 (×2): qty 100

## 2013-10-23 MED ORDER — ROCURONIUM BROMIDE 50 MG/5ML IV SOLN
INTRAVENOUS | Status: AC
  Filled 2013-10-23: qty 1

## 2013-10-23 MED ORDER — MIDAZOLAM HCL 2 MG/2ML IJ SOLN
INTRAMUSCULAR | Status: AC
  Filled 2013-10-23: qty 2

## 2013-10-23 MED ORDER — ALBUTEROL SULFATE (2.5 MG/3ML) 0.083% IN NEBU: 2.5000 mg | INHALATION_SOLUTION | Freq: Four times a day (QID) | RESPIRATORY_TRACT | Status: DC | PRN

## 2013-10-23 MED ORDER — MORPHINE SULFATE 2 MG/ML IJ SOLN: 2.0000 mg | INTRAMUSCULAR | Status: DC | PRN

## 2013-10-23 MED ORDER — OXYMETAZOLINE HCL 0.05 % NA SOLN
NASAL | Status: DC | PRN
  Administered 2013-10-23: 1 via NASAL

## 2013-10-23 MED ORDER — CLINDAMYCIN PHOSPHATE 600 MG/50ML IV SOLN: 600.0000 mg | INTRAVENOUS | Status: DC

## 2013-10-23 MED ORDER — HYDRALAZINE HCL 20 MG/ML IJ SOLN
INTRAMUSCULAR | Status: AC
  Administered 2013-10-23: 10 mg via INTRAVENOUS
  Filled 2013-10-23: qty 1

## 2013-10-23 MED ORDER — LACTATED RINGERS IV SOLN: INTRAVENOUS | Status: DC

## 2013-10-23 MED ORDER — HYDROMORPHONE HCL PF 1 MG/ML IJ SOLN
0.2500 mg | INTRAMUSCULAR | Status: DC | PRN
  Administered 2013-10-23 (×2): 0.5 mg via INTRAVENOUS

## 2013-10-23 MED ORDER — SODIUM CHLORIDE 0.9 % IJ SOLN: 3.0000 mL | INTRAMUSCULAR | Status: DC | PRN

## 2013-10-23 MED ORDER — ONDANSETRON HCL 4 MG/2ML IJ SOLN
INTRAMUSCULAR | Status: AC
  Filled 2013-10-23: qty 2

## 2013-10-23 MED ORDER — BUPIVACAINE-EPINEPHRINE (PF) 0.5% -1:200000 IJ SOLN
INTRAMUSCULAR | Status: AC
  Filled 2013-10-23: qty 7.2

## 2013-10-23 MED ORDER — NEOSTIGMINE METHYLSULFATE 1 MG/ML IJ SOLN
INTRAMUSCULAR | Status: AC
  Filled 2013-10-23: qty 10

## 2013-10-23 MED ORDER — ONDANSETRON HCL 4 MG PO TABS: 4.0000 mg | ORAL_TABLET | Freq: Four times a day (QID) | ORAL | Status: DC | PRN

## 2013-10-23 MED ORDER — HYDROMORPHONE HCL PF 1 MG/ML IJ SOLN
INTRAMUSCULAR | Status: AC
  Administered 2013-10-23: 0.5 mg via INTRAVENOUS
  Filled 2013-10-23: qty 1

## 2013-10-23 MED ORDER — PHENYLEPHRINE HCL 10 MG/ML IJ SOLN
INTRAMUSCULAR | Status: DC | PRN
  Administered 2013-10-23: 40 ug via INTRAVENOUS

## 2013-10-23 MED ORDER — LIDOCAINE HCL (CARDIAC) 20 MG/ML IV SOLN
INTRAVENOUS | Status: AC
  Filled 2013-10-23: qty 5

## 2013-10-23 MED ORDER — PROPOFOL 10 MG/ML IV BOLUS
INTRAVENOUS | Status: DC | PRN
  Administered 2013-10-23: 200 mg via INTRAVENOUS

## 2013-10-23 MED ORDER — LIDOCAINE-EPINEPHRINE 2 %-1:100000 IJ SOLN
INTRAMUSCULAR | Status: DC | PRN
  Administered 2013-10-23: 1.7 mL

## 2013-10-23 SURGICAL SUPPLY — 37 items
ALCOHOL 70% 16 OZ (MISCELLANEOUS) ×3 IMPLANT
ATTRACTOMAT 16X20 MAGNETIC DRP (DRAPES) ×3 IMPLANT
BLADE SURG 15 STRL LF DISP TIS (BLADE) ×2 IMPLANT
BLADE SURG 15 STRL SS (BLADE) ×12
COVER SURGICAL LIGHT HANDLE (MISCELLANEOUS) ×3 IMPLANT
CRADLE DONUT ADULT HEAD (MISCELLANEOUS) ×3 IMPLANT
GAUZE PACKING FOLDED 2  STR (GAUZE/BANDAGES/DRESSINGS) ×2
GAUZE PACKING FOLDED 2 STR (GAUZE/BANDAGES/DRESSINGS) ×1 IMPLANT
GAUZE SPONGE 4X4 16PLY XRAY LF (GAUZE/BANDAGES/DRESSINGS) ×3 IMPLANT
GLOVE BIOGEL PI IND STRL 6 (GLOVE) IMPLANT
GLOVE BIOGEL PI INDICATOR 6 (GLOVE) ×2
GLOVE SURG ORTHO 8.0 STRL STRW (GLOVE) ×5 IMPLANT
GLOVE SURG SS PI 6.0 STRL IVOR (GLOVE) ×5 IMPLANT
GOWN STRL REUS W/TWL 2XL LVL3 (GOWN DISPOSABLE) ×3 IMPLANT
HEMOSTAT SURGICEL .5X2 ABSORB (HEMOSTASIS) ×2 IMPLANT
KIT BASIN OR (CUSTOM PROCEDURE TRAY) ×3 IMPLANT
KIT ROOM TURNOVER OR (KITS) ×3 IMPLANT
MANIFOLD NEPTUNE WASTE (CANNULA) ×3 IMPLANT
NDL BLUNT 16X1.5 OR ONLY (NEEDLE) ×1 IMPLANT
NDL DENTAL 27 LONG (NEEDLE) IMPLANT
NEEDLE BLUNT 16X1.5 OR ONLY (NEEDLE) ×3 IMPLANT
NEEDLE DENTAL 27 LONG (NEEDLE) ×6 IMPLANT
NS IRRIG 1000ML POUR BTL (IV SOLUTION) ×3 IMPLANT
PACK EENT II TURBAN DRAPE (CUSTOM PROCEDURE TRAY) ×3 IMPLANT
PAD ARMBOARD 7.5X6 YLW CONV (MISCELLANEOUS) ×6 IMPLANT
SPONGE SURGIFOAM ABS GEL 100 (HEMOSTASIS) ×2 IMPLANT
SPONGE SURGIFOAM ABS GEL 100C (HEMOSTASIS) IMPLANT
SPONGE SURGIFOAM ABS GEL 12-7 (HEMOSTASIS) IMPLANT
SPONGE SURGIFOAM ABS GEL SZ50 (HEMOSTASIS) IMPLANT
SUCTION FRAZIER TIP 10 FR DISP (SUCTIONS) ×3 IMPLANT
SUT CHROMIC 3 0 PS 2 (SUTURE) ×10 IMPLANT
SUT CHROMIC 4 0 P 3 18 (SUTURE) ×2 IMPLANT
SYR 50ML SLIP (SYRINGE) ×3 IMPLANT
TOWEL OR 17X26 10 PK STRL BLUE (TOWEL DISPOSABLE) ×3 IMPLANT
TUBE CONNECTING 12'X1/4 (SUCTIONS) ×1
TUBE CONNECTING 12X1/4 (SUCTIONS) ×2 IMPLANT
YANKAUER SUCT BULB TIP NO VENT (SUCTIONS) ×3 IMPLANT

## 2013-10-23 NOTE — Anesthesia Postprocedure Evaluation (Signed)
  Anesthesia Post-op Note  Patient: [name]  Procedure(s) Performed: Procedure(s): Extraction of tooth #'s 1,2,3,4,5,6,7,11,12,13,14,15,16,20,21,22,23,24,27,28,29,32 with alveoloplasty and bilateral maxillary tuberosity reductions (N/A)  Patient Location: PACU  Anesthesia Type:General  Level of Consciousness: awake, alert  and oriented  Airway and Oxygen Therapy: Patient Spontanous Breathing  Post-op Pain: mild  Post-op Assessment: Post-op Vital signs reviewed, Patient's Cardiovascular Status Stable, Respiratory Function Stable, Patent Airway and Pain level controlled  Post-op Vital Signs: stable  Complications: No apparent anesthesia complications

## 2013-10-23 NOTE — Preoperative (Signed)
Beta Blockers   Reason not to administer Beta Blockers:Not Applicable 

## 2013-10-23 NOTE — H&P (Signed)
Date: 10/23/2013               Patient Name:  [name] MRN: [id number]  DOB: [birth year] Age / Sex: 63 y.o., male   PCP: Corky Sox, MD         Medical Service: Internal Medicine Teaching Service         Attending Physician: Dr. Ellwood Dense    First Contact: Dr. Stann Mainland Pager: [phone]  Second Contact: Dr. Algis Liming Pager: [phone]       After Hours (After 5p/  First Contact Pager: [phone]  weekends / holidays): Second Contact Pager: [phone]   Chief Complaint: s/p multiple teeth extractions  History of Present Illness:  [name] is a 63 year old man with history of HTN, COPD, prostate cancer, stroke in 11/2012 who presented for oral surgery with Dr. Enrique Sack on the morning of admission.   Patient was referred to Dr. Enrique Sack by Dr. Janice Norrie (urology) due to anticipated use of Xgeva (for prostate cancer).  Dental examination required prior to initiation of this medication for evaluation of dental infection risk as well as to help prevent jaw osteonecrosis related to [facility] therapy.  Patient had many dental issues on exam and after discussion with Dr. Enrique Sack decided to proceed with surgery.   Patient is now s/p multiple (22) extractions with alveoplasty and pre-prosthetic surgery under general anesthesia.  He was admitted to IMTS for observation and pain control post-operatively.  He is s/p clindamycin IV 900 mg today.  History obtained mainly through chart review as patient unable to speak with operative dressings in his mouth.   Per recent PCP note, patient has had questionable compliance with home BP medicines (HCTZ 25 mg daily, lisinopril 10 mg daily) recently.  He also recently had a colonoscopy on 10/20/13.   Other notes: 1. Bed bugs found on patient and his clothing in preop this morning thus he will be placed in contact isolation.  2. Patient may be illiterate as he shook his head no when asked if he could write whether or not he is in pain.   Meds: Current Facility-Administered  Medications  Medication Dose Route Frequency Provider Last Rate Last Dose  . aminocaproic acid (AMICAR) oral solution 50 mg/mL (5%), 100 ml  5 mL Oral To OR Lenn Cal, DDS      . clindamycin (CLEOCIN) IVPB 600 mg  600 mg Intravenous To OR Lenn Cal, DDS      . HYDROmorphone (DILAUDID) injection 0.25-0.5 mg  0.25-0.5 mg Intravenous Q5 min PRN Roberts Gaudy, MD   0.5 mg at 10/23/13 1546  . ondansetron (ZOFRAN) injection 4 mg  4 mg Intravenous Once PRN Roberts Gaudy, MD       Medications Prior to Admission  Medication Sig Dispense Refill  . albuterol (PROVENTIL HFA;VENTOLIN HFA) 108 (90 BASE) MCG/ACT inhaler Inhale 2 puffs into the lungs every 6 (six) hours as needed for wheezing.  8.5 g  3  . atorvastatin (LIPITOR) 80 MG tablet Take 1 tablet (80 mg total) by mouth daily at 6 PM.  30 tablet  5  . CALCIUM PO Take 1 tablet by mouth daily.      . hydrochlorothiazide (HYDRODIURIL) 25 MG tablet Take 1 tablet (25 mg total) by mouth daily.  30 tablet  5  . lisinopril (PRINIVIL,ZESTRIL) 10 MG tablet Take 1 tablet (10 mg total) by mouth daily.  30 tablet  5  . Multiple Vitamin (MULTIVITAMIN WITH MINERALS) TABS tablet Take 1 tablet by  mouth daily.      Marland Kitchen tiotropium (SPIRIVA HANDIHALER) 18 MCG inhalation capsule Place 1 capsule (18 mcg total) into inhaler and inhale daily.  30 capsule  5     Allergies: Allergies as of 10/13/2013 - Review Complete 10/13/2013  Allergen Reaction Noted  . Aspirin Hives 10/18/2012  . Penicillins Hives 10/18/2012   Past Medical History  Diagnosis Date  . Hypertension 05/18/2007  . COPD 05/18/2007  . Elevated PSA 05/25/2007  . 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 Stroke 12/05/2012  . Prostate cancer    Past Surgical History  Procedure Laterality Date  . 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 Liver disease Mother   . Heart attack Mother 53  . Colon cancer Neg Hx    History   Social  History  . Marital Status: Single    Spouse Name: N/A    Number of Children: 0  . Years of Education: N/A   Occupational History  . Flooring instalation     Retired   Social History Main Topics  . Smoking status: Current Every Day Smoker -- 0.50 packs/day for 50 years    Types: Cigarettes  . Smokeless tobacco: Never Used     Comment: 1/2 ppd, tobacco info given 10/16/13  . Alcohol Use: 7 - 10.5 oz/week    14-21 drink(s) per week     Comment: 2-3 beers per day  . Drug Use: No     Comment: history of cocaine use  . Sexual Activity: Not Currently   Other Topics Concern  . Not on file   Social History Narrative   Drinks 1/2 pt of wine daily and beer daily (at least 12 oz)   Denies recreational drugs    Smokes 3-4 cigarettes. 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    2 years of college at [employer] per patient           Review of Systems: Review of Systems  Constitutional: Negative for fever.  Eyes: Negative for blurred vision.  Respiratory: Negative for cough and shortness of breath.   Cardiovascular: Negative for chest pain and leg swelling.  Gastrointestinal: Negative for nausea, vomiting, abdominal pain, diarrhea and constipation.  Genitourinary: Negative for dysuria.  Musculoskeletal: Positive for falls. Negative for myalgias.  Neurological: Positive for weakness. Negative for dizziness, loss of consciousness and headaches.    Physical Exam: Blood pressure 205/110, pulse 70, temperature 97.9 F (36.6 C), temperature source Oral, resp. rate 20, height 5\' 8"  (1.727 m), weight 174 lb 4 oz (79.039 kg), SpO2 100.00%. General: alert, cooperative, and in no apparent distress but unable to speak due to operative dressings in mouth HEENT: NCAT, vision grossly intact, oropharynx unable to be evaluated due to dressings Neck: supple, no lymphadenopathy Lungs: clear to ascultation bilaterally, normal work of respiration, no  wheezes, rales, ronchi Heart: regular rate and rhythm, no murmurs, gallops, or rubs Abdomen: soft, non-tender, non-distended, normal bowel sounds Extremities: 2+ DP/PT pulses bilaterally, no cyanosis, clubbing, or edema Neurologic: alert & oriented X3, cranial nerves II-XII intact, strength grossly intact, sensation intact to light touch  BMP: Na 139 K 3.7 Cl 101  CO2 24 BUN 9 Cr 0.95 Ca 9.8  CBC:  Recent Labs  10/23/13 1535  WBC 8.3  NEUTROABS 3.7  HGB 12.2*  HCT 36.5*  MCV 85.9  PLT 290  Assessment & Plan by Problem: #Teeth removal- Dr. Enrique Sack performed multiple extractions with alveoplasty and pre-prosthetic surgery under general anesthesia on 3/16.  Per operative report, 22 teeth were removed and discarded, no complications, EBL 122 mL.  Patient received clindamycin IV 900 mg intraoperatively; of note, he has documented penicillin allergy.  IMTS will observe patient overnight and provide pain control per Dr. Ritta Slot request.  Plan is for fabrication of upper and lower complete dentures after adequate healing from the dental extractions. Xgeva therapy should be held for approximately 2-3 months after the dental extractions to minimize risks for osteonecrosis of the jaw.  Dr. Janice Norrie to consider start of Xgeva therapy earlier than that if clinically indicated.  -admit to IMTS med-surg with contact isolation for bed bugs -pain control with Dilaudid 0.5 mg IV q4h prn in immediate post-operative period; morphine IV 2 mg q2-4h prn, Percocet 5-325 mg 1-2 tabs q4h prn once patient moves to floor -Amicar oral solution  -NPO, may advance to clears tonight if patient feels ready  -NS 125 cc/hr -nutrition consult, appreciate recs -elevate head of bed -continue holding Plavix, will restart on 3/17 per dentistry recs -RN to use faces chart to help determine pain -BMP, CBC in AM   #HTN- BP elevated to 449P systolic in PACU post-op with pain component, s/p labetolol IV 5 mg once.   Patient on HCTZ 25 mg daily, lisinopril 10 mg daily at home.   -hydralazine IV 5 mg q6h prn   #COPD- stable, lungs CTAB, no concern for acute exacerbation at this time.  -continue Spiriva daily, Albuterol prn   #Prostate cancer- Patient following with Dr. Janice Norrie, see above.  Per patient, last PSA <1.  Follow-up outpatient.   #History of stroke (right centrum semiovale)- stable, patient has documented allergy to ASA, usually on Plavix at home.  Continue holding Plavix given procedure today. -restart Plavix on 3/17 per dentistry recs  #DVT PPX- SCDs only given operation today  #Code status- Full code   Dispo: Disposition is deferred at this time, awaiting improvement of current medical problems. Anticipated discharge in approximately 1-2 day(s).    The patient does have a current PCP Corky Sox, MD) and does not need an [facility] hospital follow-up appointment after discharge.   Signed: Ivin Poot, MD 10/23/2013, 4:05 PM

## 2013-10-23 NOTE — Transfer of Care (Signed)
Immediate Anesthesia Transfer of Care Note  Patient: [name]  Procedure(s) Performed: Procedure(s): Extraction of tooth #'s 1,2,3,4,5,6,7,11,12,13,14,15,16,20,21,22,23,24,27,28,29,32 with alveoloplasty and bilateral maxillary tuberosity reductions (N/A)  Patient Location: PACU  Anesthesia Type:General  Level of Consciousness: awake, alert  and patient cooperative  Airway & Oxygen Therapy: Patient Spontanous Breathing and Patient connected to face mask oxygen  Post-op Assessment: Report given to PACU RN and Post -op Vital signs reviewed and stable  Post vital signs: Reviewed and stable  Complications: No apparent anesthesia complications

## 2013-10-23 NOTE — Op Note (Signed)
Patient:            [name] Date of Birth:  18-Dec-1950 MRN:                [id number]   DATE OF PROCEDURE:  10/23/2013               OPERATIVE REPORT   PREOPERATIVE DIAGNOSES: 1.  Prostate cancer 2.  Pre-Xgeva dental protocol 3.  History of stroke 4.  Chronic apical periodontitis 5.  Chronic periodontitis 6.  Multiple retained root segments 7.  Dental caries 8.  Bilateral maxillary excessive tuberosities   POSTOPERATIVE DIAGNOSES: 1.  Prostate cancer 2.  Pre-Xgeva dental protocol 3.  History of stroke 4.  Chronic apical periodontitis 5.  Chronic periodontitis 6.  Multiple retained root segments 7.  Dental caries 8.  Bilateral maxillary excessive tuberosities 9. Bilateral palatal lateral exostoses  OPERATIONS: 1. Multiple extraction of tooth numbers 1, 2, 3, 4, 5, 6, 7, 11, 12, 13, 14, 15, 16, 20, 21, 22, 23, 24, 27, 28, 29, and 32. 2. 4 Quadrants of alveoloplasty 3. Bilateral maxillary tuberosity reductions 4. Bilateral maxillary palatal exostoses reductions   SURGEON: Lenn Cal, DDS  ASSISTANT: Camie Patience, (dental assistant)  ANESTHESIA: General anesthesia via nasoendotracheal tube.  MEDICATIONS: 1. Clindamycin IV prior to invasive dental procedures. 2. Local anesthesia with a total utilization of 5 carpules each containing 34 mg of lidocaine with 0.017 mg of epinephrine as well as 3 carpules each containing 9 mg of bupivacaine with 0.009 mg of epinephrine.  SPECIMENS: There are 22 teeth that were discarded.  DRAINS: None  CULTURES: None  COMPLICATIONS: None   ESTIMATED BLOOD LOSS: 150 mLs.  INTRAVENOUS FLUIDS: 1000 mLs of Lactated ringers solution.  INDICATIONS: The patient was recently diagnosed with prostate cancer with possible metastasis to the bone.  A dental consultation was then requested to evaluate poor dentition prior to anticipated use of Xgeva.  The patient was examined and treatment planned for extraction remaining teeth  with alveoloplasty and pre-prosthetic surgery as indicated.  This treatment plan was formulated to decrease the risks and complications associated with dental infection from affecting the patient's systemic health and to prevent future complications of osteonecrosis of the jaws related to [facility] therapy.  OPERATIVE FINDINGS: Patient was examined operating room number 10.  The teeth were identified for extraction. The patient was noted be affected by chronic periodontitis, chronic apical periodontitis, multiple retained root segments, dental caries, and bilateral maxillary excessive tuberosities. During the surgery, bilateral palatal exostoses were noted and reduced as part of the pre-prosthetic surgery.   DESCRIPTION OF PROCEDURE: Patient was brought to the main operating room number 10. Patient was then placed in the supine position on the operating table. General anesthesia was then induced per the anesthesia team. The patient was then prepped and draped in the usual manner for dental medicine procedure. A timeout was performed. The patient was identified and procedures were verified. A throat pack was placed at this time. The oral cavity was then thoroughly examined with the findings noted above. The patient was then ready for dental medicine procedure as follows:  Local anesthesia was then administered sequentially with a total utilization of 5 carpules each containing 34 mg of lidocaine with 0.017 mg of epinephrine as well as 3 carpules  each containing 9 mg bupivacaine with 0.009 mg of epinephrine.  The Maxillary left and right quadrants were first approached. Anesthesia was then delivered utilizing infiltration with lidocaine with epinephrine. A #  15 blade incision was then made from the maxillary right tuberosity and extended to the maxillary left tuberosity.  A  surgical flap was then carefully reflected. Appropriate amounts of buccal and interseptal bone were then removed utilizing a surgical  handpiece and bur and copious amounts of sterile water.  The teeth were then subluxated with a series of straight elevators. Tooth numbers 1, 2, 3, 4, 5, 6, 7, 11, 12, 13, 14, 15, 16 were then removed with a 150 forceps without complications. Alveoloplasty was then performed utilizing a ronguers and bone file. At this point time bilateral maxillary tuberosity reductions were achieved utilizing a series of 15 blade incisions and removal of redundant soft tissue. At this point time, bilateral palatal exostoses were noted and removed with a rongeur and bone file. The surgical site was then irrigated with copious amounts of sterile saline. The tissues were approximated and trimmed appropriately. A piece of Surgifoam was placed in the extraction socket area numbers 14 and in the area numbers 2 and 3. The surgical site was then closed from the maxillary right tuberosity and extended the mesial numbers 8 utilizing 3-0 chromic gut suture in a continuous interrupted suture technique x1. The maxillary left surgical site was then closed from the maxillary left tuberosity and extended the mesial #9 utilizing 3-0 chromic gut suture in a continuous interrupted suture technique x1. 3 individual sutures then placed to further close the surgical site an area numbers 14.  At this point time, the mandibular quadrants were approached. The patient was given bilateral inferior alveolar nerve blocks and long buccal nerve blocks utilizing the bupivacaine with epinephrine. Further infiltration was then achieved utilizing the lidocaine with epinephrine. A 15 blade incision was then made from the distal of number 19 and extended to the distal of #32. A surgical flap was then carefully reflected. Appropriate amounts of buccal and interseptal bone were then removed utilizing a surgical handpiece and copious amount of sterile water. Tooth numbers  20, 21, 22, 23, 24, 27, 28, 29, and 32 were then subluxated with a series of straight elevators.  The teeth were then removed with a 151 forceps without complications. Alveoloplasty was then performed utilizing a rongeurs and bone file. The tissues were approximated and trimmed appropriately. The surgical sites were then irrigated with copious amounts of sterile saline. A piece of Surgicel was then placed in each extraction socket appropriately. The mandibular left surgical site was then closed from the distal of  19 and extended to the mesial of #24 utilizing 3-0 chromic gut suture in a continuous interrupted suture technique x1. The mandibular right surgical site was then closed from the distal of #32 and extended to the mesial numbers 27 utilizing 3-0 chromic gut suture in a continuous interrupted suture technique x1. 3 individual interrupted sutures then placed to further closed surgical site from area #25-27 appropriately.  At this point time, the entire mouth was irrigated with copious amounts of sterile saline. The patient was examined for complications, seeing none, the dental medicine procedure was deemed to be complete. The throat pack was removed at this time. A series of 4 x 4 gauze were placed in the mouth to aid hemostasis. The patient was then handed over to the anesthesia team for final disposition. After an appropriate amount of time, the patient was extubated and taken to the postanesthsia care unit with stable vital signs and a good condition. All counts were correct for the dental medicine procedure. The patient was then placed on  Amicar 5% oral rinse. Patient is to rinse with 10 mls every hour for the next 10 hours to aid hemostasis. Patient is to use in a swish and spit manner.  The patient will admitted for 23 hour observation by the internal medicine residency program. Patient will be discharged in the morning as indicated. Plavix therapy will be restarted tomorrow.   Lenn Cal, DDS.

## 2013-10-23 NOTE — Anesthesia Preprocedure Evaluation (Addendum)
Anesthesia Evaluation  Patient identified by MRN, date of birth, ID band Patient awake    Reviewed: Allergy & Precautions, H&P , NPO status , Patient's Chart, lab work & pertinent test results  Airway Mallampati: II TM Distance: >3 FB     Dental  (+) Poor Dentition   Pulmonary COPDCurrent Smoker,    + decreased breath sounds      Cardiovascular hypertension, Rhythm:Regular Rate:Normal     Neuro/Psych CVA    GI/Hepatic   Endo/Other    Renal/GU      Musculoskeletal   Abdominal   Peds  Hematology   Anesthesia Other Findings   Reproductive/Obstetrics                          Anesthesia Physical Anesthesia Plan  ASA: III  Anesthesia Plan: General   Post-op Pain Management:    Induction: Intravenous  Airway Management Planned: Nasal ETT  Additional Equipment:   Intra-op Plan:   Post-operative Plan: Extubation in OR  Informed Consent: I have reviewed the patients History and Physical, chart, labs and discussed the procedure including the risks, benefits and alternatives for the proposed anesthesia with the patient or authorized representative who has indicated his/her understanding and acceptance.   Dental advisory given  Plan Discussed with: CRNA and Anesthesiologist  Anesthesia Plan Comments: (Poor Dentition Prostate CA H/P CVAs R. Sided weakness, off plavix x 7days htn Smoker/COPD  Roberts Gaudy, MD )       Anesthesia Quick Evaluation

## 2013-10-23 NOTE — H&P (Signed)
10/23/2013  Patient:            [name] Date of Birth:  30-Mar-1951 MRN:                [id number]  BP 189/102  Pulse 87  Temp(Src) 98.1 F (36.7 C) (Oral)  Resp 20  Ht 5\' 8"  (1.727 m)  Wt 174 lb 4 oz (79.039 kg)  BMI 26.50 kg/m2  SpO2 99%   Patient denies acute medical or dental changes. Patient had a recent colonoscopy on Friday, 10/20/2013. Patient with elevated blood pressure as above. Please see note of Dr. Ronnald Ramp to use as H&P for dental OR procedures. Dr. Enrique Sack  Patient ID: [name], male   DOB: [birth year], 63 y.o.   MRN: [id number]      Subjective:      Patient ID: [name] male   DOB: 1951-07-03 63 y.o.   MRN: [id number]   HPI: [name] is a 63 y.o. M w/ PMHx of HTN, COPD, Prostate CA, and CVA (11/2012), presents to [facility] today for follow up. The patient says he is feeling well recently, but has had some issues w/ weakness, a chronic and stable issue for him. He has had a few falls recently, never traumatic in nature, no associated LOC or injury, simply 2/2 LE weakness. Otherwise, he has no new complaints. He recently ran out of his medications and has not taken his BP medications in 2 weeks or so. SBP 150's today. He claims he has had close follow up with Dr. Janice Norrie regarding his prostate and his most recent PSA (according to the patient) was <1. He denies any difficulty w/ urination, pain, or discharge. He as been receiving scheduled ?androgen deprivation injections for this and claims it has been working very well. No other complaints. No recent dizziness, lightheadedness, fever, chills, weight loss, abdominal pain, nausea or vomiting.   [name] also has a large cystic mass on his pretibial region and saw orthopedics some time ago and he claims they said to leave it alone for now as he is not having pain. No issues with this at this time.     Past Medical History   Diagnosis  Date   .  Hypertension  05/18/2007   .  COPD  05/18/2007   .  Elevated  PSA  05/25/2007   .  Stroke  10/14/2010       Right centrum semiovale   .  Stroke  12/05/2012   .  Cancer         prostate       Current Outpatient Prescriptions   Medication  Sig  Dispense  Refill   .  acetaminophen (TYLENOL) 325 MG tablet  Take 1-2 tablets (325-650 mg total) by mouth every 4 (four) hours as needed.         Marland Kitchen  albuterol (PROVENTIL HFA;VENTOLIN HFA) 108 (90 BASE) MCG/ACT inhaler  Inhale 2 puffs into the lungs every 6 (six) hours as needed for wheezing.   8.5 g   3   .  antiseptic oral rinse (BIOTENE) LIQD  15 mLs by Mouth Rinse route 2 times daily at 12 noon and 4 pm.         .  atorvastatin (LIPITOR) 80 MG tablet  Take 1 tablet (80 mg total) by mouth daily at 6 PM.   30 tablet   5   .  baclofen (LIORESAL) 10 MG tablet  Place 1 tablet (10 mg  total) into feeding tube 3 (three) times daily.   30 each   0   .  chlorhexidine (PERIDEX) 0.12 % solution  Use as directed 15 mLs in the mouth or throat 2 (two) times daily.         .  cholecalciferol (VITAMIN D) 400 UNITS TABS  Take 1,000 Units by mouth daily.         .  clopidogrel (PLAVIX) 75 MG tablet  Take 1 tablet (75 mg total) by mouth daily.   30 tablet   2   .  hydrochlorothiazide (HYDRODIURIL) 25 MG tablet  Take 1 tablet (25 mg total) by mouth daily.   30 tablet   5   .  lisinopril (PRINIVIL,ZESTRIL) 10 MG tablet  Take 1 tablet (10 mg total) by mouth daily.   30 tablet   5   .  tiotropium (SPIRIVA HANDIHALER) 18 MCG inhalation capsule  Place 1 capsule (18 mcg total) into inhaler and inhale daily.   30 capsule   5       No current facility-administered medications for this visit.       Family History   Problem  Relation  Age of Onset   .  Liver disease  Mother     .  Heart attack  Mother  61       History       Social History   .  Marital Status:  Single       Spouse Name:  N/A       Number of Children:  0   .  Years of Education:  N/A       Occupational History   .  Flooring instalation         Retired        Social History Main Topics   .  Smoking status:  Current Every Day Smoker -- 0.50 packs/day       Types:  Cigarettes   .  Smokeless tobacco:  Never Used         Comment: 1/2 ppd   .  Alcohol Use:  7 - 10.5 oz/week       14-21 drink(s) per week         Comment: 2-3 beers per day   .  Drug Use:  No         Comment: history of cocaine use   .  Sexual Activity:  Not Currently       Other Topics  Concern   .  None       Social History Narrative     Drinks 1/2 pt of wine daily and beer daily (at least 12 oz)     Denies recreational drugs      Smokes 3-4 cigarettes. 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      2 years of college at [employer] per patient                 Review of Systems: General: Denies fever, chills, diaphoresis, appetite change and fatigue.  HEENT: Denies change in vision, eye pain, redness, hearing loss, congestion, sore throat, rhinorrhea, sneezing, mouth sores, trouble swallowing, neck pain, neck stiffness and tinnitus.   Respiratory: Denies SOB, DOE, cough, chest tightness, and wheezing.   Cardiovascular: Denies chest pain, palpitations and leg swelling.  Gastrointestinal: Denies nausea, vomiting, abdominal pain, diarrhea, constipation, blood in stool and abdominal distention.  Genitourinary:  Denies dysuria, urgency, frequency, hematuria, flank pain and difficulty urinating.  Endocrine: Denies hot or cold intolerance, sweats, polyuria, polydipsia. Musculoskeletal: Denies myalgias, back pain, joint swelling, arthralgias and gait problem.  Skin: Denies pallor, rash and wounds.  Neurological: Positive for mild LE weakness (unchanged from baseline). Denies dizziness, seizures, syncope, lightheadedness, numbness and headaches.  Hematological: Denies adenopathy,easy bruising, personal or family bleeding history.  Psychiatric/Behavioral: Denies mood changes, confusion, nervousness, sleep disturbance and agitation.     Objective:    Physical Exam: Filed Vitals:     10/10/13 1411   BP:  155/92   Pulse:  90   Temp:  96.2 F (35.7 C)   TempSrc:  Oral   Height:  5\' 8"  (1.727 m)   Weight:  174 lb 6.4 oz (79.107 kg)   SpO2:  99%    General: Vital signs reviewed.  Patient is a well-developed and well-nourished, in no acute distress and cooperative with exam. Alert and oriented x3.  Head: Normocephalic and atraumatic. Nose: No erythema or drainage noted.  Turbinates normal. Eyes: PERRL, EOMI, conjunctivae normal, No scleral icterus.  Neck: Supple, trachea midline, normal ROM, No JVD, masses, thyromegaly, or carotid bruit present.  Cardiovascular: RRR, S1 normal, S2 normal, no murmurs, gallops, or rubs. Pulmonary/Chest: normal respiratory effort, CTAB, no wheezes, rales, or rhonchi. Abdominal: Soft. Non-tender, non-distended, bowel sounds are normal, no masses, organomegaly, or guarding present.  Musculoskeletal: No joint deformities, erythema, or stiffness, ROM full and no nontender. Extremities: Cystic structure present inferior to the left patella, 4cm x 5cm x 7cm. Firm, mobile, smooth, no erythema, or tenderness. Unchanged from prior exam. No swelling or edema,  pulses symmetric and intact bilaterally. No cyanosis or clubbing.  Neurological: A&O x3, Strength is normal and symmetric bilaterally, cranial nerve II-XII are grossly intact, no focal motor deficit, sensory intact to light touch bilaterally.  Skin: Warm, dry and intact. No rashes or erythema. Psychiatric: Normal mood and affect. speech and behavior is normal. Cognition and memory are normal.     Assessment & Plan:      Please see problem-based assessment and plan. Given patient's age, blood pressure is near goal at this time. Claims he is compliant with his Lisinopril and HCTZ. No intervention today.  Large mass on left shin, just below the knee that has been present for 10-12 years, but he said he is now "ready to get rid of it". He  denies any recent change in size, pain, irritation, or redness. On examination, the mass resembles a cystic structure, about 4cm x 5cm x 7cm, and is firm, smooth, no erythema, or tenderness. It is also mobile, not adhering to the patella or underlying tibia, but is more firm than a lipoma.  -Gave patient a referral for general surgery as it appears the mass will most likely need to be removed rather than drained, given its size.   Follows with [facility] (Dr. Janice Norrie). Will follow up there later this month. Patient claims he has been diagnosed with Prostate CA, however, the only urology note seen does not state this officially. Patient denies any urinary symptoms.          Patient admitted in 11/2102 for significant right sided CVA. No further left sided residual weakness. Patient claims he has not had any recent issues with motor function, sensory losses, gait, speech, or swallowing.         Routine health maintenance - Assessment & Plan Note    Patient has never had a colonoscopy. Referral  sent for colonoscopy as patient is agreeable.

## 2013-10-23 NOTE — Telephone Encounter (Signed)
Left message that we called for f/u 

## 2013-10-23 NOTE — Progress Notes (Signed)
PRE-OPERATIVE NOTE:  10/23/2013 [name] [id number]  VITALS: BP 175/102  Pulse 87  Temp(Src) 98.1 F (36.7 C) (Oral)  Resp 20  Ht 5\' 8"  (1.727 m)  Wt 174 lb 4 oz (79.039 kg)  BMI 26.50 kg/m2  SpO2 99%  Lab Results  Component Value Date   WBC 7.9 10/10/2013   HGB 12.3* 10/10/2013   HCT 36.7* 10/10/2013   MCV 84.2 10/10/2013   PLT 319 10/10/2013   BMET    Component Value Date/Time   NA 137 10/10/2013 1448   K 3.7 10/10/2013 1448   CL 101 10/10/2013 1448   CO2 30 10/10/2013 1448   GLUCOSE 81 10/10/2013 1448   BUN 13 10/10/2013 1448   CREATININE 1.01 10/10/2013 1448   CREATININE 0.91 12/22/2012 0545   CALCIUM 10.4 10/10/2013 1448   GFRNONAA 88* 12/22/2012 0545   GFRAA >90 12/22/2012 0545    Lab Results  Component Value Date   INR 1.04 12/21/2012   INR 0.99 12/05/2012   INR 0.92 10/14/2010   No results found for this basename: PTT     [name] presents for extraction of remaining teeth with alveoloplasty and pre-prosthetic surgery as needed the operating room with general anesthesia.   SUBJECTIVE: The patient denies any acute medical or dental changes and agrees to proceed with treatment as planned.  EXAM: No sign of acute dental changes.  ASSESSMENT: Patient is affected by chronic apical periodontitis, multiple retained root segments, multiple dental caries, tooth mobility, and excessive tuberosities.  PLAN: Patient agrees to proceed with treatment as planned in the operating room as previously discussed and accepts the risks, benefits, complications of the proposed treatment. We discussed complications of bleeding, bruising, swelling, nerve damage, sinus involvement, root tip fracture, mandible fracture, infection, and general anesthesia complications. Patient is aware of the potential for other complications not necessarily mentioned above.   Lenn Cal, DDS

## 2013-10-24 ENCOUNTER — Emergency Department (HOSPITAL_COMMUNITY): Payer: Medicaid Other

## 2013-10-24 ENCOUNTER — Inpatient Hospital Stay (HOSPITAL_COMMUNITY)
Admission: EM | Admit: 2013-10-24 | Discharge: 2013-10-27 | DRG: 683 | Disposition: A | Discharge status: Home Health | Payer: Medicaid Other | Attending: Internal Medicine | Admitting: Internal Medicine

## 2013-10-24 ENCOUNTER — Telehealth: Payer: Self-pay | Admitting: *Deleted

## 2013-10-24 ENCOUNTER — Encounter (HOSPITAL_COMMUNITY): Payer: Self-pay | Admitting: Dentistry

## 2013-10-24 DIAGNOSIS — J4489 Other specified chronic obstructive pulmonary disease: Secondary | ICD-10-CM

## 2013-10-24 DIAGNOSIS — I10 Essential (primary) hypertension: Secondary | ICD-10-CM

## 2013-10-24 DIAGNOSIS — I69998 Other sequelae following unspecified cerebrovascular disease: Secondary | ICD-10-CM

## 2013-10-24 DIAGNOSIS — E860 Dehydration: Secondary | ICD-10-CM | POA: Diagnosis present

## 2013-10-24 DIAGNOSIS — Z48815 Encounter for surgical aftercare following surgery on the digestive system: Secondary | ICD-10-CM

## 2013-10-24 DIAGNOSIS — R5381 Other malaise: Secondary | ICD-10-CM | POA: Diagnosis present

## 2013-10-24 DIAGNOSIS — Z2989 Encounter for other specified prophylactic measures: Secondary | ICD-10-CM

## 2013-10-24 DIAGNOSIS — J449 Chronic obstructive pulmonary disease, unspecified: Secondary | ICD-10-CM | POA: Diagnosis present

## 2013-10-24 DIAGNOSIS — Z79899 Other long term (current) drug therapy: Secondary | ICD-10-CM

## 2013-10-24 DIAGNOSIS — R627 Adult failure to thrive: Secondary | ICD-10-CM | POA: Diagnosis present

## 2013-10-24 DIAGNOSIS — K08109 Complete loss of teeth, unspecified cause, unspecified class: Secondary | ICD-10-CM | POA: Diagnosis present

## 2013-10-24 DIAGNOSIS — Z8673 Personal history of transient ischemic attack (TIA), and cerebral infarction without residual deficits: Secondary | ICD-10-CM

## 2013-10-24 DIAGNOSIS — C61 Malignant neoplasm of prostate: Secondary | ICD-10-CM

## 2013-10-24 DIAGNOSIS — Z7902 Long term (current) use of antithrombotics/antiplatelets: Secondary | ICD-10-CM

## 2013-10-24 DIAGNOSIS — IMO0002 Reserved for concepts with insufficient information to code with codable children: Secondary | ICD-10-CM

## 2013-10-24 DIAGNOSIS — Z418 Encounter for other procedures for purposes other than remedying health state: Secondary | ICD-10-CM

## 2013-10-24 DIAGNOSIS — N179 Acute kidney failure, unspecified: Principal | ICD-10-CM

## 2013-10-24 DIAGNOSIS — R531 Weakness: Secondary | ICD-10-CM | POA: Diagnosis present

## 2013-10-24 DIAGNOSIS — R29898 Other symptoms and signs involving the musculoskeletal system: Secondary | ICD-10-CM | POA: Diagnosis present

## 2013-10-24 DIAGNOSIS — C7951 Secondary malignant neoplasm of bone: Secondary | ICD-10-CM | POA: Diagnosis present

## 2013-10-24 DIAGNOSIS — C7952 Secondary malignant neoplasm of bone marrow: Secondary | ICD-10-CM

## 2013-10-24 DIAGNOSIS — B888 Other specified infestations: Secondary | ICD-10-CM | POA: Diagnosis present

## 2013-10-24 DIAGNOSIS — F17200 Nicotine dependence, unspecified, uncomplicated: Secondary | ICD-10-CM

## 2013-10-24 DIAGNOSIS — I639 Cerebral infarction, unspecified: Secondary | ICD-10-CM

## 2013-10-24 DIAGNOSIS — Z48814 Encounter for surgical aftercare following surgery on the teeth or oral cavity: Secondary | ICD-10-CM

## 2013-10-24 DIAGNOSIS — S98139A Complete traumatic amputation of one unspecified lesser toe, initial encounter: Secondary | ICD-10-CM

## 2013-10-24 LAB — MAGNESIUM: Magnesium: 1.6 mg/dL (ref 1.5–2.5)

## 2013-10-24 LAB — BASIC METABOLIC PANEL
BUN: 10 mg/dL (ref 6–23)
BUN: 13 mg/dL (ref 6–23)
CO2: 26 meq/L (ref 19–32)
CO2: 26 meq/L (ref 19–32)
CREATININE: 0.82 mg/dL (ref 0.50–1.35)
CREATININE: 1.25 mg/dL (ref 0.50–1.35)
Calcium: 9.5 mg/dL (ref 8.4–10.5)
Calcium: 9.9 mg/dL (ref 8.4–10.5)
Chloride: 102 mEq/L (ref 96–112)
Chloride: 100 mEq/L (ref 96–112)
GFR calc Af Amer: >90 mL/min (ref >90)
GFR calc Af Amer: 69 mL/min — ABNORMAL LOW (ref >90)
GFR, EST NON AFRICAN AMERICAN: 59 mL/min — AB (ref >90)
GLUCOSE: 113 mg/dL — AB (ref 70–99)
Glucose, Bld: 101 mg/dL — ABNORMAL HIGH (ref 70–99)
Potassium: 4.1 mEq/L (ref 3.7–5.3)
Potassium: 3.7 mEq/L (ref 3.7–5.3)
SODIUM: 140 meq/L (ref 137–147)
Sodium: 139 mEq/L (ref 137–147)

## 2013-10-24 LAB — CBC
HCT: 32.6 % — ABNORMAL LOW (ref 39.0–52.0)
HCT: 36.5 % — ABNORMAL LOW (ref 39.0–52.0)
HEMOGLOBIN: 12.1 g/dL — AB (ref 13.0–17.0)
Hemoglobin: 10.7 g/dL — ABNORMAL LOW (ref 13.0–17.0)
MCH: 28.4 pg (ref 26.0–34.0)
MCH: 28.5 pg (ref 26.0–34.0)
MCHC: 32.8 g/dL (ref 30.0–36.0)
MCHC: 33.2 g/dL (ref 30.0–36.0)
MCV: 86.5 fL (ref 78.0–100.0)
MCV: 85.9 fL (ref 78.0–100.0)
PLATELETS: 287 10*3/uL (ref 150–400)
PLATELETS: 260 10*3/uL (ref 150–400)
RBC: 3.77 MIL/uL — AB (ref 4.22–5.81)
RBC: 4.25 MIL/uL (ref 4.22–5.81)
RDW: 14.8 % (ref 11.5–15.5)
RDW: 14.8 % (ref 11.5–15.5)
WBC: 11 10*3/uL — ABNORMAL HIGH (ref 4.0–10.5)
WBC: 9.2 10*3/uL (ref 4.0–10.5)

## 2013-10-24 LAB — COMPREHENSIVE METABOLIC PANEL
ALBUMIN: 2.7 g/dL — AB (ref 3.5–5.2)
ALT: 21 U/L (ref 0–53)
AST: 27 U/L (ref 0–37)
Alkaline Phosphatase: 55 U/L (ref 39–117)
BUN: 13 mg/dL (ref 6–23)
CALCIUM: 9.4 mg/dL (ref 8.4–10.5)
CO2: 25 mEq/L (ref 19–32)
Chloride: 101 mEq/L (ref 96–112)
Creatinine, Ser: 1.13 mg/dL (ref 0.50–1.35)
GFR calc non Af Amer: 67 mL/min — ABNORMAL LOW (ref >90)
GFR, EST AFRICAN AMERICAN: 78 mL/min — AB (ref >90)
GLUCOSE: 98 mg/dL (ref 70–99)
Potassium: 3.5 mEq/L — ABNORMAL LOW (ref 3.7–5.3)
Sodium: 138 mEq/L (ref 137–147)
Total Bilirubin: 0.5 mg/dL (ref 0.3–1.2)
Total Protein: 6.8 g/dL (ref 6.0–8.3)

## 2013-10-24 LAB — LIPASE, BLOOD: Lipase: 14 U/L (ref 11–59)

## 2013-10-24 LAB — I-STAT CG4 LACTIC ACID, ED: LACTIC ACID, VENOUS: 2.13 mmol/L (ref 0.5–2.2)

## 2013-10-24 LAB — CBG MONITORING, ED: Glucose-Capillary: 113 mg/dL — ABNORMAL HIGH (ref 70–99)

## 2013-10-24 LAB — CK: Total CK: 162 U/L (ref 7–232)

## 2013-10-24 MED ORDER — CHLORHEXIDINE GLUCONATE 0.12 % MT SOLN
15.0000 mL | Freq: Two times a day (BID) | OROMUCOSAL | Status: DC
  Administered 2013-10-24: 15 mL via OROMUCOSAL
  Filled 2013-10-24: qty 15

## 2013-10-24 MED ORDER — CLOPIDOGREL BISULFATE 75 MG PO TABS
75.0000 mg | ORAL_TABLET | Freq: Every day | ORAL | Status: DC
Start: 2013-10-25 — End: 2013-12-01

## 2013-10-24 MED ORDER — HYDROCHLOROTHIAZIDE 25 MG PO TABS
25.0000 mg | ORAL_TABLET | Freq: Every day | ORAL | Status: DC
  Administered 2013-10-24: 25 mg via ORAL
  Filled 2013-10-24: qty 1

## 2013-10-24 MED ORDER — SODIUM CHLORIDE 0.9 % IR SOLN
200.0000 mL | Status: DC
  Administered 2013-10-24: 200 mL

## 2013-10-24 MED ORDER — CLOPIDOGREL BISULFATE 75 MG PO TABS: 75.0000 mg | ORAL_TABLET | Freq: Every day | ORAL | Status: DC

## 2013-10-24 MED ORDER — OXYCODONE-ACETAMINOPHEN 5-325 MG PO TABS: 1.0000 | ORAL_TABLET | ORAL | Status: DC | PRN

## 2013-10-24 MED ORDER — ATORVASTATIN CALCIUM 80 MG PO TABS
80.0000 mg | ORAL_TABLET | Freq: Every day | ORAL | Status: DC
  Administered 2013-10-25 – 2013-10-26 (×2): 80 mg via ORAL
  Filled 2013-10-24 (×5): qty 1

## 2013-10-24 MED ORDER — CLOPIDOGREL BISULFATE 75 MG PO TABS
75.0000 mg | ORAL_TABLET | Freq: Every day | ORAL | Status: DC
  Administered 2013-10-25 – 2013-10-27 (×3): 75 mg via ORAL
  Filled 2013-10-24 (×5): qty 1

## 2013-10-24 MED ORDER — ALBUTEROL SULFATE (2.5 MG/3ML) 0.083% IN NEBU: 2.5000 mg | INHALATION_SOLUTION | Freq: Four times a day (QID) | RESPIRATORY_TRACT | Status: DC | PRN

## 2013-10-24 MED ORDER — SODIUM CHLORIDE 0.9 % IV SOLN
INTRAVENOUS | Status: AC
  Administered 2013-10-25 (×2): via INTRAVENOUS

## 2013-10-24 MED ORDER — ALBUTEROL SULFATE HFA 108 (90 BASE) MCG/ACT IN AERS: 2.0000 | INHALATION_SPRAY | Freq: Four times a day (QID) | RESPIRATORY_TRACT | Status: DC | PRN

## 2013-10-24 MED ORDER — TIOTROPIUM BROMIDE MONOHYDRATE 18 MCG IN CAPS
18.0000 ug | ORAL_CAPSULE | Freq: Every day | RESPIRATORY_TRACT | Status: DC
  Administered 2013-10-25 – 2013-10-27 (×2): 18 ug via RESPIRATORY_TRACT
  Filled 2013-10-24: qty 5

## 2013-10-24 MED ORDER — BOOST / RESOURCE BREEZE PO LIQD
1.0000 | Freq: Three times a day (TID) | ORAL | Status: DC
  Administered 2013-10-24: 1 via ORAL

## 2013-10-24 MED ORDER — SODIUM CHLORIDE 0.9 % IV BOLUS (SEPSIS)
1000.0000 mL | Freq: Once | INTRAVENOUS | Status: AC
  Administered 2013-10-24: 1000 mL via INTRAVENOUS

## 2013-10-24 MED ORDER — ADULT MULTIVITAMIN W/MINERALS CH
1.0000 | ORAL_TABLET | Freq: Every day | ORAL | Status: DC
  Administered 2013-10-25 – 2013-10-27 (×3): 1 via ORAL
  Filled 2013-10-24 (×4): qty 1

## 2013-10-24 MED ORDER — LISINOPRIL 10 MG PO TABS
10.0000 mg | ORAL_TABLET | Freq: Every day | ORAL | Status: DC
  Administered 2013-10-24: 10 mg via ORAL
  Filled 2013-10-24: qty 1

## 2013-10-24 NOTE — Progress Notes (Signed)
Subjective: [name] is doing much better this morning, post-operative dressings have been removed.  He is able to speak now, taking PO, denies pain.  He requested 3 doses of IV morphine overnight.  Patient states he is ready to go home today.  We will restart home BP meds and Plavix this morning (this will also ensure he can take pills by mouth).   With regard to bed bugs, counseled patient on discarding clothing, bed sheets, etc where possible and wash and dry remaining items on hot setting; vacuuming would also be beneficial.   Objective: Vital signs in last 24 hours: Filed Vitals:   10/23/13 1800 10/23/13 2004 10/24/13 0133 10/24/13 0527  BP: 186/89 190/91 154/92 170/93  Pulse: 79 88 104 96  Temp:  98.5 F (36.9 C) 97.6 F (36.4 C) 98.6 F (37 C)  TempSrc:  Axillary Axillary Axillary  Resp:  18 17 18   Height:      Weight:      SpO2: 100% 100% 100% 100%   Weight change:   Intake/Output Summary (Last 24 hours) at 10/24/13 0835 Last data filed at 10/24/13 0527  Gross per 24 hour  Intake 972.33 ml  Output   1025 ml  Net -52.67 ml   PEX General: alert, cooperative, and in no apparent distress HEENT: NCAT, vision grossly intact, clots present, sutures intact, intra and extraoral swelling Neck: supple, no lymphadenopathy Lungs: clear to ascultation bilaterally, normal work of respiration, no wheezes, rales, ronchi Heart: regular rate and rhythm, no murmurs, gallops, or rubs Abdomen: soft, non-tender, non-distended, normal bowel sounds Extremities: 2+ DP/PT pulses bilaterally, no cyanosis, clubbing, or edema Neurologic: alert & oriented X3, cranial nerves II-XII intact, strength grossly intact, sensation intact to light touch Skin: no bite marks appreciated   Lab Results: Basic Metabolic Panel:  Recent Labs Lab 10/23/13 1535 10/24/13 0635  NA 139 140  K 3.7 4.1  CL 101 102  CO2 24 26  GLUCOSE 102* 101*  BUN 9 10  CREATININE 0.95 0.82  CALCIUM 9.8 9.5    CBC:  Recent Labs Lab 10/23/13 1535 10/24/13 0635  WBC 8.3 11.0*  NEUTROABS 3.7  --   HGB 12.2* 10.7*  HCT 36.5* 32.6*  MCV 85.9 86.5  PLT 290 287    Medications: I have reviewed the patient's current medications. Scheduled Meds: . chlorhexidine  15 mL Mouth/Throat BID  . sodium chloride  3 mL Intravenous Q12H  . sodium chloride  3 mL Intravenous Q12H  . tiotropium  18 mcg Inhalation Daily   Continuous Infusions: . lactated ringers 20 mL/hr at 10/23/13 1753  . sodium chloride irrigation     PRN Meds:.sodium chloride, albuterol, hydrALAZINE, morphine injection, ondansetron (ZOFRAN) IV, ondansetron, oxyCODONE-acetaminophen, sodium chloride Assessment/Plan: #Teeth removal- Dr. Enrique Sack performed multiple extractions with alveoplasty and pre-prosthetic surgery under general anesthesia on 3/16.  Per operative report, 22 teeth were removed and discarded, no complications, EBL 619 mL.  Patient received clindamycin IV 900 mg intraoperatively.  IMTS observed patient overnight and provide pain control per Dr. Ritta Slot request.  Plan is for fabrication of upper and lower complete dentures after adequate healing from the dental extractions.  Xgeva therapy should be held for approximately 2-3 months after the dental extractions to minimize risks for osteonecrosis of the jaw.  Hgb 12.2 --> 10.7.  Patient taking PO, denied pain this morning.  He requested 3 doses of morphine overnight, will convert to PO pain medication in anticipation of discharge. -nutrition consult, appreciate recs; dysphagia  1 diet -Percocet 5-325 mg 1-2 tabs q4h prn  -salt water rinses q2h while awake -additional Chlorhexidine rinse prior to discharge -dental follow-up arranged next week for suture removal and recheck-continue NS 125 cc/hr through discharge -restart Plavix today per dentistry recs  #HTN- BP elevated to 944H systolic in PACU post-op with pain component, now 675 systolic.  Patient received several doses  of IV labetalol and IV hydralazine since surgery.  He is on HCTZ 25 mg daily, lisinopril 10 mg daily at home, will restart this regimen since he is now taking PO.  -restart lisinopril, HCTZ   #COPD- stable, lungs CTAB, no concern for acute exacerbation at this time.  -continue Spiriva daily, Albuterol prn   #Prostate cancer- Patient following with Dr. Janice Norrie, see above. Per patient, last PSA <1. Follow-up outpatient.   #History of stroke (right centrum semiovale)- stable, patient has documented allergy to ASA, usually on Plavix at home.  Have been holding Plavix given procedure.  -restart Plavix per dentistry recs   Dispo:  Anticipated discharge today.    The patient does have a current PCP Corky Sox, MD) and does not need an [facility] hospital follow-up appointment after discharge.   .Services Needed at time of discharge: Y = Yes, Blank = No PT:   OT:   RN:   Equipment:   Other:     LOS: 1 day   Ivin Poot, MD 10/24/2013, 8:35 AM

## 2013-10-24 NOTE — Progress Notes (Signed)
Discharge home. Home discharge instruction given, no questions verbalized. 

## 2013-10-24 NOTE — ED Provider Notes (Signed)
CSN: [id number]     Arrival date & time 10/24/13  1628 History   First MD Initiated Contact with Patient 10/24/13 1707     Chief Complaint  Patient presents with  . Extremity Weakness    HPI 63 year old male with a history of hypertension, COPD, prostate cancer, and multiple prior strokes presents complaining of weakness.  Onset several hours ago, about 10:00 this morning while riding in a car with his friend who he lives with. Of note the patient had multiple teeth extracted yesterday. Prior to today he has been feeling well. He reports generalized weakness, worse in his bilateral lower extremities. He has been unable to walk since onset. Normally he walks with a cane. Symptoms are moderate to severe. He has not tried any treatments. Symptoms are aggravated by nothing and relieved by nothing. He also has some mild abdominal pain which is poorly localized. This is not associated with any fever, chills, nausea, vomiting, diarrhea, constipation.  He denies headache, visual changes, difficulty with speech, gait or balance, numbness, paresthesias. He has no dysuria, urinary frequency, urinary urgency. He has no skin lesions.  On arrival to the emergency department he is mildly tachycardic in the low 100s, borderline hypotensive around 90/50. I note that on prior hospital visits he has been moderately hypertensive in the 409-811 systolic range. Oxygen saturation is 92% on room air. He is afebrile by oral temperature.   Past Medical History  Diagnosis Date  . Hypertension 05/18/2007  . COPD 05/18/2007  . Elevated PSA 05/25/2007  . 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 Stroke 12/05/2012  . Prostate cancer    Past Surgical History  Procedure Laterality Date  . 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 Multiple extractions with alveoloplasty N/A 10/23/2013    Procedure: Extraction of tooth #'s 1,2,3,4,5,6,7,11,12,13,14,15,16,20,21,22,23,24,27,28,29,32 with alveoloplasty and bilateral  maxillary tuberosity reductions;  Surgeon: Lenn Cal, DDS;  Location: [facility];  Service: Oral Surgery;  Laterality: N/A;  . Leg surgery      pin  right leg   Family History  Problem Relation Age of Onset  . Liver disease Mother   . Heart attack Mother 62  . Colon cancer Neg Hx    History  Substance Use Topics  . Smoking status: Current Every Day Smoker -- 0.50 packs/day for 50 years    Types: Cigarettes  . Smokeless tobacco: Never Used     Comment: 1/2 ppd, tobacco info given 10/16/13  . Alcohol Use: 7 - 10.5 oz/week    14-21 drink(s) per week     Comment: 2-3 beers per day    Review of Systems  Constitutional: Positive for appetite change and fatigue. Negative for fever and chills.  HENT: Negative for congestion and rhinorrhea.   Eyes: Negative for visual disturbance.  Respiratory: Negative for cough and shortness of breath.   Cardiovascular: Negative for chest pain and leg swelling.  Gastrointestinal: Negative for nausea, vomiting, abdominal pain and diarrhea.  Genitourinary: Negative for dysuria, urgency, frequency, flank pain and difficulty urinating.  Musculoskeletal: Negative for back pain, neck pain and neck stiffness.  Skin: Negative for rash.  Neurological: Positive for weakness. Negative for syncope, numbness and headaches.  All other systems reviewed and are negative.      Allergies  Aspirin and Penicillins  Home Medications   No current outpatient prescriptions on file. BP 150/80  Pulse 89  Temp(Src) 97.9 F (36.6 C) (Oral)  Resp 18  Ht 5\' 8"  (  1.727 m)  Wt 169 lb 8 oz (76.885 kg)  BMI 25.78 kg/m2  SpO2 97% Physical Exam GENERAL: borderline lethargic, but oriented X 4.  Globally weak; required 3 person assist from wheelchair to bed. HEENT: dry mucus membranes. CARDIO: borderline tachycardia, upper 90s.  Hypotensive 90/50. NEURO: lethargic, but oriented X 4.  CN 2-12 intact.  3/5 strength in bilateral lower extremities, left weaker than right;  4/5 strength in bilateral upper extremities.  Normal sensation to light touch in bilateral upper and lower extremities, symmetric.  Cannot stand due to weakness.  GENERAL: well developed, well nourished, no acute distress HEENT: atraumatic; normocephalic.  PERRL.  EOMI.  Sclera normal.  Mucus membranes moist.  Oropharynx clear.   NECK: supple.  No JVD.  Normal ROM. CARDIOVASCULAR: heart regular of rate and rhythm.  Normal heart sounds with no murmur, gallop, or rub.  Intact, strong, and bilaterally equal distal pulses.  Skin warm and dry.  No peripheral edema. PULMONARY: chest clear to auscultation bilaterally.  No wheezes, rales, or rhonci.  Normal work of breathing. ABDOMEN: soft, non-distended, non-tender.  No pulsatile mass. GU: deferred MUSCULOSKELETAL: atraumatic; no edema  SKIN: warm and dry with no noted rash, abscess, or cellulitis PSYCH: normal mood and affect; normal memory to recent events  ED Course  Procedures (including critical care time) Labs Review Labs Reviewed  CBC - Abnormal; Notable for the following:    Hemoglobin 12.1 (*)    HCT 36.5 (*)    All other components within normal limits  BASIC METABOLIC PANEL - Abnormal; Notable for the following:    Glucose, Bld 113 (*)    GFR calc non Af Amer 59 (*)    GFR calc Af Amer 69 (*)    All other components within normal limits  CBG MONITORING, ED - Abnormal; Notable for the following:    Glucose-Capillary 113 (*)    All other components within normal limits  GRAM STAIN  URINE CULTURE  CULTURE, BLOOD (ROUTINE X 2)  CULTURE, BLOOD (ROUTINE X 2)  COMPREHENSIVE METABOLIC PANEL  LIPASE, BLOOD  URINALYSIS, ROUTINE W REFLEX MICROSCOPIC  MAGNESIUM  CK  URINE RAPID DRUG SCREEN (HOSP PERFORMED)  I-STAT CG4 LACTIC ACID, ED  I-STAT VENOUS BLOOD GAS, ED   Imaging Review Dg Chest 2 View  10/24/2013   CLINICAL DATA:  Extremity weakness  EXAM: CHEST  2 VIEW  COMPARISON:  NM BONE WHOLE BODY dated 12/26/2012; DG CHEST 2 VIEW  dated 12/17/2012  FINDINGS: Normal cardiac silhouette. There is left lower lobe opacity representing atelectasis or infiltrate. Right lung is clear. No pneumothorax. Low lung volumes.  IMPRESSION: Right base atelectasis versus infiltrate.   Electronically Signed   By: Suzy Bouchard M.D.   On: 10/24/2013 18:33   Dg Lumbar Spine Complete  10/25/2013   CLINICAL DATA:  Bilateral leg pain, history of prostate cancer  EXAM: LUMBAR SPINE - COMPLETE 4+ VIEW  COMPARISON:  None.  FINDINGS: Five views of lumbar spine submitted. No acute fracture or subluxation. Multilevel anterior spurring is noted. Mild disc space flattening at L4-L5 level. Moderate disc space flattening at L5-S1 level. Atherosclerotic calcifications of abdominal aorta and iliac arteries. Lateral spurring noted at L3-L4 level.  IMPRESSION: No acute fracture or subluxation. Multilevel anterior spurring. Disc space flattening at L4-L5 and L5-S1 level.   Electronically Signed   By: Lahoma Crocker M.D.   On: 10/25/2013 14:56   Ct Head Wo Contrast  10/24/2013   CLINICAL DATA:  Left-sided weakness  EXAM:  CT HEAD WITHOUT CONTRAST  TECHNIQUE: Contiguous axial images were obtained from the base of the skull through the vertex without intravenous contrast.  COMPARISON:  NM BONE WHOLE BODY dated 12/26/2012; CT HEAD W/O CM dated 12/07/2012  FINDINGS: There is no evidence of mass effect, midline shift, or extra-axial fluid collections. There is no evidence of a space-occupying lesion or intracranial hemorrhage. There is no evidence of a cortical-based area of acute infarction. There is an old right cerebellar infarct with encephalomalacia. There is generalized cerebral atrophy. There is periventricular white matter low attenuation likely secondary to microangiopathy.  The ventricles and sulci are appropriate for the patient's age. The basal cisterns are patent.  Visualized portions of the orbits are unremarkable. There is mild bilateral ethmoid sinus mucosal thickening.  There is complete opacification of the left maxillary sinus. There is mild right maxillary sinus mucosal thickening. The sphenoid sinuses are not pneumatized. There is mild mucosal thickening in the right frontoethmoidal recess. Cerebrovascular atherosclerotic calcifications are noted.  The osseous structures are unremarkable.  IMPRESSION: 1. No acute intracranial pathology. 2. Sinus disease as described above. 3. Chronic right cerebellar infarct.   Electronically Signed   By: Kathreen Devoid   On: 10/24/2013 18:22     EKG Interpretation None      MDM   63 yo M here c/o weakness.  Started today.  He had multiple teeth extracted yesterday, and has had poor PO intake.  Normally ambulates with cane.  Unable to ambulate now.  Required 3 person assist to get from wheelchair to bed.  Denies fever, chills, stroke-like symptoms (does have h/o stroke with L residual hemiparesis), chest pain, SOB, abdominal pain, back pain, urinary symptoms, or other acute complaints. On exam, afebrile, mildly tachycardic, hypotensive 90s/50s (normally runs 888K-800L systolic).  Chronically ill appearing.  Poor effort by patient.  Neuro exam with weakness in LLE greater than RLE (baseline per patient, though currently much more weak globally).  He cannot lift left leg off bed.  He has 4-/5 strength in RLE and bilateral UE.  Remainder of neuro exam non-focal as detailed, though speech difficult to completely assess due to significant mouth and facial swelling 2/2 oral surgery yesterday.  I do not see evidence of abscess or complication from his oral surgery.  Mucus membranes are dry.  Remainder of exam unremarkable.  Lab workup largely unrevealing as detailed above.  Hb is stable.  No leukocytosis.  Normal electrolytes.  Normal lactate.  Sent blood and urine cx due to hypotension.  Head CT and CXR with no acute abnormality.  Hypotension resolved with IVF bolus, 2 L.  Started mIVF.  Admitted to medicine.  Patient will need PT/OT  and may ultimately require SNF placement.  Likely dehydrated and poor nutritional status.  Final diagnoses:  Weakness  dehydration     Wendall Papa, MD 10/25/13 4917

## 2013-10-24 NOTE — Discharge Instructions (Signed)
Please take all of your medicines as prescribed.  Be sure you are doing salt water rinses every 2 hours while you are awake so your mouth heals well.  Don't forget your follow-up appointments with Dr. Enrique Sack and Dr. Michail Sermon in [facility] next week.   Please call clinic at [phone] if you need refill on a Plavix before your follow-up appointment.   We are giving you a prescription for pain medication that you should only take as needed.  Remember that this medicine can make you sleepy.   Also be sure you remember to vacuum your home and wash and dry all linens and clothing on hot setting.   MOUTH CARE AFTER SURGERY  FACTS:  Ice used in ice bag helps keep the swelling down, and can help lessen the pain.  It is easier to treat pain BEFORE it happens.  Spitting disturbs the clot and may cause bleeding to start again, or to get worse.  Smoking delays healing and can cause complications.  Sharing prescriptions can be dangerous.  Do not take medications not recently prescribed for you.  Antibiotics may stop birth control pills from working.  Use other means of birth control while on antibiotics.  Warm salt water rinses after the first 24 hours will help lessen the swelling:  Use 1/2 teaspoonful of table salt per oz.of water.  DO NOT:  Do not spit.  Do not drink through a straw.  Strongly advised not to smoke, dip snuff or chew tobacco at least for 3 days.  Do not eat sharp or crunchy foods.  Avoid the area of surgery when chewing.  Do not stop your antibiotics before your instructions say to do so.  Do not eat hot foods until bleeding has stopped.  If you need to, let your food cool down to room temperature.  EXPECT:  Some swelling, especially first 2-3 days.  Soreness or discomfort in varying degrees.  Follow your dentist's instructions about how to handle pain before it starts.  Pinkish saliva or light blood in saliva, or on your pillow in the morning.  This can last around 24  hours.  Bruising inside or outside the mouth.  This may not show up until 2-3 days after surgery.  Don't worry, it will go away in time.  Pieces of "bone" may work themselves loose.  It's OK.  If they bother you, let us know.  WHAT TO DO IMMEDIATELY AFTER SURGERY:  Bite on the gauze with steady pressure for 1-2 hours.  Don't chew on the gauze.  Do not lie down flat.  Raise your head support especially for the first 24 hours.  Apply ice to your face on the side of the surgery.  You may apply it 20 minutes on and a few minutes off.  Ice for 8-12 hours.  You may use ice up to 24 hours.  Before the numbness wears off, take a pain pill as instructed.  Prescription pain medication is not always required.  SWELLING:  Expect swelling for the first couple of days.  It should get better after that.  If swelling increases 3 days or so after surgery; let us know as soon as possible.  FEVER:  Take Tylenol every 4 hours if needed to lower your temperature, especially if it is at 100F or higher.  Drink lots of fluids.  If the fever does not go away, let us know.  BREATHING TROUBLE:  Any unusual difficulty breathing means you have to have someone bring you  to the emergency room ASAP  BLEEDING:  Light oozing is expected for 24 hours or so.  Prop head up with pillows  Avoid spitting  Do not confuse bright red fresh flowing blood with lots of saliva colored with a little bit of blood.  If you notice some bleeding, place gauze or a tea bag where it is bleeding and apply CONSTANT pressure by biting down for 1 hour.  Avoid talking during this time.  Do not remove the gauze or tea bag during this hour to "check" the bleeding.  If you notice bright RED bleeding FLOWING out of particular area, and filling the floor of your mouth, put a wad of gauze on that area, bite down firmly and constantly.  Call us immediately.  If we're closed, have someone bring you to the emergency room.  ORAL  HYGIENE:  Brush your teeth as usual after meals and before bedtime.  Use a soft toothbrush around the area of surgery.  DO NOT AVOID BRUSHING.  Otherwise bacteria(germs) will grow and may delay healing or encourage infection.  Since you cannot spit, just gently rinse and let the water flow out of your mouth.  DO NOT SWISH HARD.  EATING:  Cool liquids are a good point to start.  Increase to soft foods as tolerated.  PRESCRIPTIONS:  Follow the directions for your prescriptions exactly as written.  If Dr. Enrique Sack gave you a narcotic pain medication, do not drive, operate machinery or drink alcohol when on that medication.  QUESTIONS:  Call our office during office hours [phone] or call the Emergency Room at [phone].

## 2013-10-24 NOTE — Progress Notes (Signed)
INITIAL NUTRITION ASSESSMENT  DOCUMENTATION CODES Per approved criteria  -Not Applicable   INTERVENTION: Provided and reviewed "Dysphagia Level 1 Pureed Foods" and " Level 1 Diet Tips" from the Academy of Nutrition and Dietetics Provide Resource Breeze TID with meals Diet advancement per MD discretion RD to continue to monitor and provide further intervention ADD Ensure Complete BID if PO intake remains inadequate with diet advancement  NUTRITION DIAGNOSIS: Predicted sub optimal energy intake related to recent oral surgery as evidenced by pt on clear liquid diet with meal completion <25%.   Goal: Pt to meet >/= 90% of their estimated nutrition needs   Monitor:  Diet advancement, PO intake, weight trend, labs  Reason for Assessment: Consult for diet education  63 y.o. male  Admitting Dx: Aftercare following surgery of teeth, oral cavity or digestive system  ASSESSMENT: 63 year old man with history of HTN, COPD, prostate cancer, stroke in 11/2012 who presented for oral surgery. Post op day #1 S/P total odontectomy with alveoloplasty and pre-prosthetic surgery.  RD consulted for diet education for pt with recent complete teeth extraction and prostate cancer. Provided and reviewed "Dysphagia Level 1 Pureed Foods" and " Level 1 Diet Tips" from the Academy of Nutrition and Dietetics. Discussed tips for preparing foods in each food group.  Pt reports that he was maintaining his weight and eating well PTA. He used to have a PEG but, he reports this has been removed. Pt is on a clear liquid diet and he reports that he was only able to eat one jello this morning for breakfast. Encouraged pt to monitor his weight. If he is not able to eat well or maintain weight on soft foods diet, discussed ways for pt to add calories to food and encouraged pt to drink Boost or Ensure supplements. RD name and contact information provided.    Height: Ht Readings from Last 1 Encounters:  10/23/13 5\' 8"   (1.727 m)    Weight: Wt Readings from Last 1 Encounters:  10/23/13 173 lb 15.1 oz (78.9 kg)    Ideal Body Weight: 154 lbs  % Ideal Body Weight: 112%  Wt Readings from Last 10 Encounters:  10/23/13 173 lb 15.1 oz (78.9 kg)  10/23/13 173 lb 15.1 oz (78.9 kg)  10/20/13 173 lb (78.472 kg)  10/16/13 173 lb 6.4 oz (78.654 kg)  10/10/13 174 lb 6.4 oz (79.107 kg)  06/19/13 187 lb (84.823 kg)  05/01/13 181 lb 6.4 oz (82.283 kg)  04/21/13 181 lb 9.6 oz (82.373 kg)  01/24/13 172 lb (78.019 kg)  12/30/12 166 lb 14.2 oz (75.7 kg)    Usual Body Weight: 175 lbs  % Usual Body Weight: 99%  BMI:  Body mass index is 26.45 kg/(m^2).  Estimated Nutritional Needs: Kcal: 1900-2100 Protein: 105-115 grams Fluid: 1.9-2.1 L/day  Skin: intact  Diet Order: Clear Liquid  EDUCATION NEEDS: -Education needs addressed   Intake/Output Summary (Last 24 hours) at 10/24/13 1022 Last data filed at 10/24/13 0945  Gross per 24 hour  Intake 1332.33 ml  Output   1025 ml  Net 307.33 ml    Last BM: PTA  Labs:   Recent Labs Lab 10/23/13 1535 10/24/13 0635  NA 139 140  K 3.7 4.1  CL 101 102  CO2 24 26  BUN 9 10  CREATININE 0.95 0.82  CALCIUM 9.8 9.5  GLUCOSE 102* 101*    CBG (last 3)  No results found for this basename: GLUCAP,  in the last 72 hours  Scheduled  Meds: . chlorhexidine  15 mL Mouth/Throat BID  . sodium chloride  3 mL Intravenous Q12H  . sodium chloride  3 mL Intravenous Q12H  . tiotropium  18 mcg Inhalation Daily    Continuous Infusions: . lactated ringers 20 mL/hr at 10/23/13 1753  . sodium chloride irrigation      Past Medical History  Diagnosis Date  . Hypertension 05/18/2007  . COPD 05/18/2007  . Elevated PSA 05/25/2007  . 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 Stroke 12/05/2012  . Prostate cancer     Past Surgical History  Procedure Laterality Date  . 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

## 2013-10-24 NOTE — Telephone Encounter (Signed)
Call from pt's friend Hedy Camara stating he was d/c  from [address] @ [facility] about 20 minutes ago s/p dental extractions yesterday.  They are unable to get him out of the car. Pt is alert but can't walk, 2 friends are trying to get him up but are unable to. I asked if this was new and Hedy Camara said He was having trouble walking before admission.  I called [facility] [place] and talked with Almyra Free, Camera operator and told her the situation.  She will have the discharge nurse call the friend/family.  I am not sure how to advise as I don't know the pt.  Phone # [phone]

## 2013-10-24 NOTE — ED Notes (Signed)
Pt got all teeth pulled yesterday and came home today.  Pt states he has had 2 strokes.  Left side is weak, but he can hold up left arm, not sure if this is new for patient.  Pt states both his legs are weak

## 2013-10-24 NOTE — Progress Notes (Signed)
Report received from [facility]

## 2013-10-24 NOTE — H&P (Signed)
INTERNAL MEDICINE TEACHING ATTENDING NOTE  Day 1 of stay  Patient name: [name]  MRN: [id number] Date of birth: 1950-11-30   63 y.o.with prostate cancer who is to be put on Xgeva, a medication that requires dental hygiene in the setting of possible risk of osteonecrosis of jaw. He was here for dental surgery and the dental surgeon needed him to be under medical observation post op. I have met with him today, and he appears to be doing very well, post few hours surgery. He has tolerated his liquid diet very well, and has been advanced. His medications have been changed to oral, and he is stable medically to go home, pending approval from [employer].  I have seen and evaluated this patient and discussed it with my IM resident team.  Please see the rest of the plan per resident note from today.   [place], [place] 10/24/2013, 12:03 PM.

## 2013-10-24 NOTE — Discharge Summary (Signed)
Name: [name] MRN: [id number] DOB: 08/09/1951 63 y.o. PCP: Terry Sox, MD  Date of Admission: 10/23/2013  8:51 AM Date of Discharge: 10/24/2013 Attending Physician: Madilyn Fireman, MD  Discharge Diagnosis: Principal Problem:   Aftercare following surgery of teeth, oral cavity or digestive system Active Problems:   Hypertension   COPD   History of stroke   Prostate cancer  Discharge Medications:   Medication List         albuterol 108 (90 BASE) MCG/ACT inhaler  Commonly known as:  PROVENTIL HFA;VENTOLIN HFA  Inhale 2 puffs into the lungs every 6 (six) hours as needed for wheezing.     atorvastatin 80 MG tablet  Commonly known as:  LIPITOR  Take 1 tablet (80 mg total) by mouth daily at 6 PM.     CALCIUM PO  Take 1 tablet by mouth daily.     clopidogrel 75 MG tablet  Commonly known as:  PLAVIX  Take 1 tablet (75 mg total) by mouth daily with breakfast.  Start taking on:  10/25/2013     hydrochlorothiazide 25 MG tablet  Commonly known as:  HYDRODIURIL  Take 1 tablet (25 mg total) by mouth daily.     lisinopril 10 MG tablet  Commonly known as:  PRINIVIL,ZESTRIL  Take 1 tablet (10 mg total) by mouth daily.     multivitamin with minerals Tabs tablet  Take 1 tablet by mouth daily.     oxyCODONE-acetaminophen 5-325 MG per tablet  Commonly known as:  PERCOCET/ROXICET  Take 1-2 tablets by mouth every 4 (four) hours as needed for moderate pain.     tiotropium 18 MCG inhalation capsule  Commonly known as:  SPIRIVA HANDIHALER  Place 1 capsule (18 mcg total) into inhaler and inhale daily.        Disposition and follow-up:   TerryKonstantin P Pittman was discharged from [facility] in Stable condition.  At the hospital follow up visit please address:  1.  Did patient attend dental follow-up on 3/25?  Is he doing ok eating?  2.  BP check   3.  Please double check that patient has enough Plavix  4.  Labs / imaging needed at time of follow-up:  none  5.  Pending labs/ test needing follow-up: none   Follow-up Appointments: Follow-up Information   Follow up with [name], DDS On 11/01/2013. (8:30am)    Specialty:  Dentistry   Contact information:   [place] [address] [phone]       Follow up with Terry Heckle, MD On 11/02/2013. (9am)    Specialty:  Internal Medicine   Contact information:   [address] [place] [address] [phone]       Discharge Instructions: Discharge Orders   Future Appointments Provider Department Dept Phone   11/01/2013 8:30 AM [name], DDS [place] [phone]   11/02/2013 9:00 AM Terry Huff, MD [facility] [place] [phone]   01/10/2014 65:15 PM Terry Sox, MD [place] [phone]   Future Orders Complete By Expires   Call MD for:  severe uncontrolled pain  As directed    Call MD for:  temperature >100.4  As directed    Consult to dietitian  As directed    Scheduling Instructions:     Patient is edentulous.   Diet - low sodium heart healthy  As directed    Gauze  As directed  Comments:     4 x 4 gauze to oral bleeding sites until oozing stops.   Increase activity slowly  As directed       Consultations:  dentistry, nutrition  Admission HPI:  [name] is a 63 year old man with history of HTN, COPD, prostate cancer, stroke in 11/2012 who presented for oral surgery with Dr. Kristin Pittman on the morning of admission.  Patient was referred to Dr. Kristin Pittman by Dr. Brunilda Pittman (urology) due to anticipated use of Xgeva (for prostate cancer). Dental examination required prior to initiation of this medication for evaluation of dental infection risk as well as to help prevent jaw osteonecrosis related to [facility] therapy. Patient had many dental issues on exam and after discussion with Dr. Kristin Pittman decided to proceed with surgery.  Patient is now s/p multiple (22)  extractions with alveoplasty and pre-prosthetic surgery under general anesthesia. He was admitted to IMTS for observation and pain control post-operatively. He is s/p clindamycin IV 900 mg today. History obtained mainly through chart review as patient unable to speak with operative dressings in his mouth.  Per recent PCP note, patient has had questionable compliance with home BP medicines (HCTZ 25 mg daily, lisinopril 10 mg daily) recently. He also recently had a colonoscopy on 10/20/13.    Hospital Course by problem list: 1. Teeth removal- Dr. Kristin Pittman performed multiple extractions with alveoplasty and pre-prosthetic surgery under general anesthesia on 3/16.  Per operative report, 22 teeth were removed and discarded, no complications, EBL 150 mL.  Patient received clindamycin IV 900 mg intraoperatively.  IMTS observed patient overnight and provide pain control per Dr. Luretha Pittman request.  Plan is for fabrication of upper and lower complete dentures after adequate healing from the dental extractions.  Xgeva therapy should be held for approximately 2-3 months after the dental extractions to minimize risks for osteonecrosis of the jaw.  Hgb 12.2 --> 10.7 day of discharge.  Patient received chlorhexidine rinses while inpatient, advised to perform q2h salt water rinses while awake.  Patient taking PO, denied pain morning of discharge.  Nutrition was consulted and recommended dysphagia 1 diet while patient edentulous.  He requested 3 doses of morphine overnight, converted to PO pain medication in anticipation of discharge.  Provided rx for Percocet 5-325 mg 1-2 tabs q4h prn #35 at discharge.  Patient has both dental and PCP follow-up next week; sutures will be removed at that time.   2. HTN- Patient has history of uncontrolled hypertension with questionable medication compliance.  BP elevated to 200s systolic in PACU post-op with pain component, 170 systolic of morning of discharge.  Patient received several doses  of IV labetalol and IV hydralazine after surgery and overnight.  He is on HCTZ 25 mg daily, lisinopril 10 mg daily at home, restart this regimen morning of discharge since he was taking PO.   3. COPD- stable, lungs CTAB, no concern for acute exacerbation at this time.  Continued Spiriva daily, Albuterol prn while inpatient and at discharge.   4. Prostate cancer- Patient following with Dr. Brunilda Pittman, see above. Per patient, last PSA <1. Follow-up outpatient.  5. History of stroke (right centrum semiovale)- stable, patient has documented allergy to ASA, usually on Plavix at home.  Held Plavix through 3/16 given procedure.  Restarted Plavix 75 mg daily on day of discharge per dentistry recs.   6. ?Bed bugs- Per pre-op RN, patient had bugs on his person and in his clothing.  Patient was placed on contact isolation.  Counseled him to  discard clothing, linens, etc where possible and wash and dry remaining items on hot setting; vacuuming would also be beneficial.    Discharge Vitals:   BP 154/90  Pulse 98  Temp(Src) 98.6 F (37 C) (Axillary)  Resp 18  Ht 5\' 8"  (1.727 m)  Wt 173 lb 15.1 oz (78.9 kg)  BMI 26.45 kg/m2  SpO2 100%  Discharge Labs:  Results for orders placed during the hospital encounter of 10/23/13 (from the past 24 hour(s))  BASIC METABOLIC PANEL     Status: Abnormal   Collection Time    10/23/13  3:35 PM      Result Value Ref Range   Sodium 139  137 - 147 mEq/L   Potassium 3.7  3.7 - 5.3 mEq/L   Chloride 101  96 - 112 mEq/L   CO2 24  19 - 32 mEq/L   Glucose, Bld 102 (*) 70 - 99 mg/dL   BUN 9  6 - 23 mg/dL   Creatinine, Ser 0.95  0.50 - 1.35 mg/dL   Calcium 9.8  8.4 - 10.5 mg/dL   GFR calc non Af Amer 86 (*) >90 mL/min   GFR calc Af Amer >90  >90 mL/min  CBC WITH DIFFERENTIAL     Status: Abnormal   Collection Time    10/23/13  3:35 PM      Result Value Ref Range   WBC 8.3  4.0 - 10.5 K/uL   RBC 4.25  4.22 - 5.81 MIL/uL   Hemoglobin 12.2 (*) 13.0 - 17.0 g/dL   HCT 36.5  (*) 39.0 - 52.0 %   MCV 85.9  78.0 - 100.0 fL   MCH 28.7  26.0 - 34.0 pg   MCHC 33.4  30.0 - 36.0 g/dL   RDW 14.6  11.5 - 15.5 %   Platelets 290  150 - 400 K/uL   Neutrophils Relative % 45  43 - 77 %   Neutro Abs 3.7  1.7 - 7.7 K/uL   Lymphocytes Relative 44  12 - 46 %   Lymphs Abs 3.6  0.7 - 4.0 K/uL   Monocytes Relative 11  3 - 12 %   Monocytes Absolute 0.9  0.1 - 1.0 K/uL   Eosinophils Relative 1  0 - 5 %   Eosinophils Absolute 0.1  0.0 - 0.7 K/uL   Basophils Relative 0  0 - 1 %   Basophils Absolute 0.0  0.0 - 0.1 K/uL  BASIC METABOLIC PANEL     Status: Abnormal   Collection Time    10/24/13  6:35 AM      Result Value Ref Range   Sodium 140  137 - 147 mEq/L   Potassium 4.1  3.7 - 5.3 mEq/L   Chloride 102  96 - 112 mEq/L   CO2 26  19 - 32 mEq/L   Glucose, Bld 101 (*) 70 - 99 mg/dL   BUN 10  6 - 23 mg/dL   Creatinine, Ser 0.82  0.50 - 1.35 mg/dL   Calcium 9.5  8.4 - 10.5 mg/dL   GFR calc non Af Amer >90  >90 mL/min   GFR calc Af Amer >90  >90 mL/min  CBC     Status: Abnormal   Collection Time    10/24/13  6:35 AM      Result Value Ref Range   WBC 11.0 (*) 4.0 - 10.5 K/uL   RBC 3.77 (*) 4.22 - 5.81 MIL/uL   Hemoglobin 10.7 (*) 13.0 - 17.0 g/dL  HCT 32.6 (*) 39.0 - 52.0 %   MCV 86.5  78.0 - 100.0 fL   MCH 28.4  26.0 - 34.0 pg   MCHC 32.8  30.0 - 36.0 g/dL   RDW 14.8  11.5 - 15.5 %   Platelets 287  150 - 400 K/uL    Signed: Ivin Poot, MD 10/24/2013, 11:42 AM   Time Spent on Discharge: 35 minutes Services Ordered on Discharge: none Equipment Ordered on Discharge: none

## 2013-10-24 NOTE — ED Notes (Signed)
MD at bedside. 

## 2013-10-24 NOTE — Telephone Encounter (Signed)
I spoke with Ms. [name] given her concerns.  I call the intern who saw the patient this AM.  There was nothing unusual from her standpoint, although the patient was not ambulated or observed to ambulate.  I called [name] (his friend who picked him up) and he states that [name] can not move either leg and that his arms are weak, but he is not complaining of any back pain.  Given his h/o prostate cancer with a PSA of 236 and his history of hypertension and CVA this raises the concern of an organic cause of the bilateral lower extremity weakness (cord compression) or a cerebellar CVA impacting upon coordination of movement.  When I spoke to [place] he stated that he was taking the patient back to the ER as we were speaking.  I agree with this decision as this inability to move the legs will require a formal evaluation given the potential etiologies causing it could be considered a medical emergency.

## 2013-10-24 NOTE — Progress Notes (Signed)
POST OPERATIVE NOTE: Post op day #1 S/P total odontectomy with alveoloplasty and pre-prosthetic surgery.  10/24/2013 [name] [id number]  VITALS: BP 170/93  Pulse 96  Temp(Src) 98.6 F (37 C) (Axillary)  Resp 18  Ht 5\' 8"  (1.727 m)  Wt 173 lb 15.1 oz (78.9 kg)  BMI 26.45 kg/m2  SpO2 100%  LABS:  Lab Results  Component Value Date   WBC 8.3 10/23/2013   HGB 12.2* 10/23/2013   HCT 36.5* 10/23/2013   MCV 85.9 10/23/2013   PLT 290 10/23/2013   BMET    Component Value Date/Time   NA 139 10/23/2013 1535   K 3.7 10/23/2013 1535   CL 101 10/23/2013 1535   CO2 24 10/23/2013 1535   GLUCOSE 102* 10/23/2013 1535   BUN 9 10/23/2013 1535   CREATININE 0.95 10/23/2013 1535   CREATININE 1.01 10/10/2013 1448   CALCIUM 9.8 10/23/2013 1535   GFRNONAA 86* 10/23/2013 1535   GFRAA >90 10/23/2013 1535    Lab Results  Component Value Date   INR 1.04 12/21/2012   INR 0.99 12/05/2012   INR 0.92 10/14/2010   No results found for this basename: PTT     Terry Jenkins Mathieu is status post extraction of remaining teeth with alveoloplasty and pre-prosthetic surgery.  SUBJECTIVE: Patient with some pain and discomfort. Patient denies active bleeding.   EXAM: No sign of infection or ooze. Clots are present. Sutures are intact. Intraoral and extraoral swelling is noted.  ASSESSMENT: Post operative course is consistent with dental procedures performed in the operating room with general anesthesia. The patient is now edentulous.  PLAN: 1. Chlorhexidine gluconate rinses twice daily 2. Salt water rinses every 2 hours while awake in between the chlorhexidine rinses 3. Dental medicine in 7-10 days for evaluation for suture removal. 4. Follow up with the dentist of his choice for the fabrication of upper and and lower complete dentures after adequate healing 5. Nutritional consult for edentulous diet pending.  Lenn Cal, DDS

## 2013-10-24 NOTE — H&P (Signed)
Date: 10/24/2013               Patient Name:  [name] MRN: [id number]  DOB: May 01, 1951 Age / Sex: 62 y.o., male   PCP: Corky Sox, MD         Medical Service: Internal Medicine Teaching Service         Attending Physician: Dr. Madilyn Fireman, MD    First Contact: Dr. Stann Mainland Pager: [phone]  Second Contact: Dr. Algis Liming Pager: [phone]       After Hours (After 5p/  First Contact Pager: [phone]  weekends / holidays): Second Contact Pager: [phone]   Chief Complaint: Weakness  History of Present Illness: [name] is a 63 y.o. male w/ PMHx of HTN, COPD, prostate cancer, stroke in 11/2012 who presented for oral surgery with Dr. Enrique Sack DDS on 10/23/13 (see previous h&p for details) for multiple tooth extractions (22) with alveoplasty and pre-prosthetic surgery under general anesthesia. Patient was discharged today but returned to the ED shortly after accompanied by a friend as patient was having significant LE weakness, mostly left-sided in nature, inhibiting his ability to ambulate. The patient walks with a cane at baseline ans is generally independent with respect to ADL's and IADL's. [name] has suffered from 2 previous CVA's, most recently in 2014, in which he still has some residual left-sided weakness, but not significant enough to inhibit his ability to walk with a cane. He denies any dizziness, lightheadedness, LOC, sensory losses, or UE weakness. He also denies any recent fever, chills, nausea, vomiting, difficulty urinating or passing bowel movements. He does admit to limited po intake since his procedure yesterday and appears somewhat dry on exam.  [name] still has some oral pain and swelling, but claims this is much improved from yesterday.  CT head was performed in the ED, found to have no acute intracranial abnormality, but with chronic right cerebellar changes seen on previous CT. Also shown to have increased Cr to 1.25, up from baseline around 0.80.    Meds: Current Facility-Administered Medications  Medication Dose Route Frequency Provider Last Rate Last Dose  . 0.9 %  sodium chloride infusion   Intravenous Continuous Na Li, MD      . albuterol (PROVENTIL) (2.5 MG/3ML) 0.083% nebulizer solution 2.5 mg  2.5 mg Nebulization Q6H PRN Madilyn Fireman, MD      . atorvastatin (LIPITOR) tablet 80 mg  80 mg Oral q1800 Na Li, MD      . Derrill Memo ON 10/25/2013] clopidogrel (PLAVIX) tablet 75 mg  75 mg Oral Q breakfast Na Li, MD      . Derrill Memo ON 10/25/2013] multivitamin with minerals tablet 1 tablet  1 tablet Oral Daily Na Nicoletta Dress, MD      . Derrill Memo ON 10/25/2013] tiotropium (SPIRIVA) inhalation capsule 18 mcg  18 mcg Inhalation Daily Na Li, MD       Medications Prior to Admission  Medication Sig Dispense Refill  . albuterol (PROVENTIL HFA;VENTOLIN HFA) 108 (90 BASE) MCG/ACT inhaler Inhale 2 puffs into the lungs every 6 (six) hours as needed for wheezing.  8.5 g  3  . atorvastatin (LIPITOR) 80 MG tablet Take 1 tablet (80 mg total) by mouth daily at 6 PM.  30 tablet  5  . CALCIUM PO Take 1 tablet by mouth daily.      Derrill Memo ON 10/25/2013] clopidogrel (PLAVIX) 75 MG tablet Take 1 tablet (75 mg total) by mouth daily with breakfast.      .  hydrochlorothiazide (HYDRODIURIL) 25 MG tablet Take 1 tablet (25 mg total) by mouth daily.  30 tablet  5  . lisinopril (PRINIVIL,ZESTRIL) 10 MG tablet Take 1 tablet (10 mg total) by mouth daily.  30 tablet  5  . Multiple Vitamin (MULTIVITAMIN WITH MINERALS) TABS tablet Take 1 tablet by mouth daily.      Marland Kitchen oxyCODONE-acetaminophen (PERCOCET/ROXICET) 5-325 MG per tablet Take 1-2 tablets by mouth every 4 (four) hours as needed for moderate pain.  35 tablet  0  . tiotropium (SPIRIVA HANDIHALER) 18 MCG inhalation capsule Place 1 capsule (18 mcg total) into inhaler and inhale daily.  30 capsule  5   Allergies: Allergies as of 10/24/2013 - Review Complete 10/24/2013  Allergen Reaction Noted  . Aspirin Hives 10/18/2012  . Penicillins  Hives 10/18/2012   Past Medical History  Diagnosis Date  . Hypertension 05/18/2007  . COPD 05/18/2007  . Elevated PSA 05/25/2007  . 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 Stroke 12/05/2012  . Prostate cancer    Past Surgical History  Procedure Laterality Date  . 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 Multiple extractions with alveoloplasty N/A 10/23/2013    Procedure: Extraction of tooth #'s 1,2,3,4,5,6,7,11,12,13,14,15,16,20,21,22,23,24,27,28,29,32 with alveoloplasty and bilateral maxillary tuberosity reductions;  Surgeon: Lenn Cal, DDS;  Location: [facility];  Service: Oral Surgery;  Laterality: N/A;   Family History  Problem Relation Age of Onset  . Liver disease Mother   . Heart attack Mother 26  . Colon cancer Neg Hx    History   Social History  . Marital Status: Single    Spouse Name: N/A    Number of Children: 0  . Years of Education: N/A   Occupational History  . Flooring instalation     Retired   Social History Main Topics  . Smoking status: Current Every Day Smoker -- 0.50 packs/day for 50 years    Types: Cigarettes  . Smokeless tobacco: Never Used     Comment: 1/2 ppd, tobacco info given 10/16/13  . Alcohol Use: 7 - 10.5 oz/week    14-21 drink(s) per week     Comment: 2-3 beers per day  . Drug Use: No     Comment: history of cocaine use  . Sexual Activity: Not Currently   Other Topics Concern  . Not on file   Social History Narrative   Drinks 1/2 pt of wine daily and beer daily (at least 12 oz)   Denies recreational drugs    Smokes 3-4 cigarettes. 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    2 years of college at [employer] per patient          Review of Systems: General: Denies fever, chills, diaphoresis, appetite change and fatigue.  HEENT: Positive for oral pain and swelling. Denies bleeding, facial changes, or vision loss.  Respiratory: Denies SOB, DOE, cough, chest tightness, and  wheezing.   Cardiovascular: Denies chest pain and palpitations.  Gastrointestinal: Denies nausea, vomiting, abdominal pain, diarrhea, constipation, blood in stool and abdominal distention.  Genitourinary: Denies dysuria, urgency, frequency, hematuria, and flank pain. Endocrine: Denies hot or cold intolerance, polyuria, and polydipsia. Musculoskeletal: Denies myalgias, back pain, joint swelling, arthralgias and gait problem.  Skin: Denies pallor, rash and wounds.  Neurological: Positive for LE weakness. Denies dizziness, seizures, syncope, lightheadedness, numbness and headaches.  Psychiatric/Behavioral: Denies mood changes, confusion, nervousness, sleep disturbance and agitation.  Physical Exam:  Filed Vitals:   10/24/13 1750 10/24/13 1800 10/24/13 1959 10/24/13 2200  BP: 112/68  130/63 134/82  Pulse: 97   95  Temp:  100 F (37.8 C)  98 F (36.7 C)  TempSrc:  Rectal  Oral  Resp:   20 20  Height:    5\' 8"  (1.727 m)  Weight:    169 lb 8 oz (76.885 kg)  SpO2: 96%  100% 97%  General: Vital signs reviewed.  Patient is a well-developed and well-nourished, in no acute distress and cooperative with exam.  HEENT: Normocephalic and atraumatic. PERRL, EOMI, conjunctivae normal, No scleral icterus. Oral swelling. No obvious bleeding.  Neck: Supple, but with mild swelling, trachea midline, normal ROM, No JVD, masses, thyromegaly, or carotid bruit present.  Cardiovascular: RRR, S1 normal, S2 normal, no murmurs, gallops, or rubs. Pulmonary/Chest: Air entry equal bilaterally, no wheezes, rales, or rhonchi. Abdominal: Soft, non-tender, non-distended, BS +, no masses, organomegaly, or guarding present.  Musculoskeletal: No joint deformities, erythema, or stiffness, ROM full and nontender. Extremities: Pretibial mass on LLE (chronic). Pulses symmetric and intact bilaterally. No cyanosis or clubbing. Neurological: A&O x3, cranial nerve II-XII are grossly intact. Strength 3/5 on LLE, 4/5 on RLE. Strength  5/5 in UE's bilaterally. No focal sensory abnormalities.  Skin: Warm, dry and intact. No rashes or erythema. Psychiatric: Normal mood and affect. speech and behavior is normal. Cognition and memory are normal.   Lab results: Basic Metabolic Panel:  Recent Labs  10/24/13 0635 10/24/13 1715  NA 140 139  K 4.1 3.7  CL 102 100  CO2 26 26  GLUCOSE 101* 113*  BUN 10 13  CREATININE 0.82 1.25  CALCIUM 9.5 9.9   CBC:  Recent Labs  10/23/13 1535 10/24/13 0635 10/24/13 1706  WBC 8.3 11.0* 9.2  NEUTROABS 3.7  --   --   HGB 12.2* 10.7* 12.1*  HCT 36.5* 32.6* 36.5*  MCV 85.9 86.5 85.9  PLT 290 287 260   CBG:  Recent Labs  10/24/13 1647  GLUCAP 113*   Imaging results:  Dg Chest 2 View  10/24/2013   CLINICAL DATA:  Extremity weakness  EXAM: CHEST  2 VIEW  COMPARISON:  NM BONE WHOLE BODY dated 12/26/2012; DG CHEST 2 VIEW dated 12/17/2012  FINDINGS: Normal cardiac silhouette. There is left lower lobe opacity representing atelectasis or infiltrate. Right lung is clear. No pneumothorax. Low lung volumes.  IMPRESSION: Right base atelectasis versus infiltrate.   Electronically Signed   By: Suzy Bouchard M.D.   On: 10/24/2013 18:33   Ct Head Wo Contrast  10/24/2013   CLINICAL DATA:  Left-sided weakness  EXAM: CT HEAD WITHOUT CONTRAST  TECHNIQUE: Contiguous axial images were obtained from the base of the skull through the vertex without intravenous contrast.  COMPARISON:  NM BONE WHOLE BODY dated 12/26/2012; CT HEAD W/O CM dated 12/07/2012  FINDINGS: There is no evidence of mass effect, midline shift, or extra-axial fluid collections. There is no evidence of a space-occupying lesion or intracranial hemorrhage. There is no evidence of a cortical-based area of acute infarction. There is an old right cerebellar infarct with encephalomalacia. There is generalized cerebral atrophy. There is periventricular white matter low attenuation likely secondary to microangiopathy.  The ventricles and sulci are  appropriate for the patient's age. The basal cisterns are patent.  Visualized portions of the orbits are unremarkable. There is mild bilateral ethmoid sinus mucosal thickening. There is complete opacification of the left maxillary sinus. There is mild right maxillary sinus mucosal  thickening. The sphenoid sinuses are not pneumatized. There is mild mucosal thickening in the right frontoethmoidal recess. Cerebrovascular atherosclerotic calcifications are noted.  The osseous structures are unremarkable.  IMPRESSION: 1. No acute intracranial pathology. 2. Sinus disease as described above. 3. Chronic right cerebellar infarct.   Electronically Signed   By: Kathreen Devoid   On: 10/24/2013 18:22   Other results: EKG: Sinus tachycardia, LAFB  Assessment & Plan by Problem: Mr. MAVERIC PILTZ is a 63 y.o. male w/ PMHx of HTN, COPD, prostate cancer, stroke in 11/2012 who presented for oral surgery with Dr. Enrique Sack on 10/23/13, readmitted for weakness and AKI.   Weakness- Likely 2/2 deconditioning/dehydration in the setting of recent surgery and poor po intake given 22 tooth extractions. Patient admits to poor po intake over the last 24 hours. Patient w/ previous CVA and baseline LLE weakness, however, not significant enough to affect gait w/ aid of a cane. No hyperreflexia on exam, only LE weakness, L>R. No sensory abnormalities or CN dysfunction. CT in ED shows no acute intracranial abnormality. Do no suspect new CVA at this time.  -PT/OT eval in AM -Continue NS @ 75 ml/hr for now -Continue Dysphagia 1 diet  Recent tooth extractions- Recent oral surgery with Dr. Enrique Sack DDS on 10/23/13 (see previous h&p for details) for multiple tooth extractions (22) with alveoplasty and pre-prosthetic surgery under general anesthesia. Swelling significantly improved from yesterday. Do not suspect infection at this time.  -Continue to monitor clinically -Dys 1 diet as above   AKI- Likely pre-renal in nature given recent surgery  w/ limited po intake. Cr 0.80 at baseline, 1.25 on admission.  -CK pending, rule out rhabdomyolysis, however unlikely at this time. -UA pending -Hold HCTZ and Lisinopril for now (patient also normotensive)  COPD- Stable, lungs CTAB, no concern for acute exacerbation at this time.  -Continue Spiriva daily, Albuterol prn   Prostate Cancer- Patient following with Dr. Janice Norrie, see above. Per patient, last PSA <1. Follow-up outpatient.   History of CVA (Right centrum semiovale)- Stable, patient has documented allergy to ASA, usually on Plavix at home. Plavix held s/p extractions yesterday. CT performed in ED today w/ no acute findings as described above. -Restarted Plavix today per dentistry recs  Bed Bugs- Patient found to have bed bugs on admission yesterday.  -Contact isolation  DVT/PE PPx- SCD's  Dispo: Disposition is deferred at this time, awaiting improvement of current medical problems. Anticipated discharge in approximately 1-2 day(s).   The patient does have a current PCP Corky Sox, MD) and does need an [facility] hospital follow-up appointment after discharge.  The patient does not have transportation limitations that hinder transportation to clinic appointments.  Signed: Corky Sox, MD 10/24/2013, 11:27 PM

## 2013-10-24 NOTE — ED Provider Notes (Signed)
Reports generalized weakness since 11 AM today. Patient states he had several teeth pulled yesterday. He had only a slight amount of juice yesterday and no by mouth intake today. On exam patient is chronically ill-appearing HEENT exam no facial asymmetry mucous membranes dry neck supple neurologic exam motor strength from left arm 3/5 right upper extremity 4/5 right lower extremity 2/5 left lower extremity 1/5  Orlie Dakin, MD 10/25/13 2800

## 2013-10-25 ENCOUNTER — Observation Stay (HOSPITAL_COMMUNITY): Payer: Medicaid Other

## 2013-10-25 ENCOUNTER — Encounter (HOSPITAL_COMMUNITY): Payer: Self-pay | Admitting: General Practice

## 2013-10-25 ENCOUNTER — Encounter: Payer: Self-pay | Admitting: Internal Medicine

## 2013-10-25 DIAGNOSIS — R29898 Other symptoms and signs involving the musculoskeletal system: Secondary | ICD-10-CM

## 2013-10-25 DIAGNOSIS — R531 Weakness: Secondary | ICD-10-CM | POA: Diagnosis present

## 2013-10-25 LAB — CBC
HCT: 31.5 % — ABNORMAL LOW (ref 39.0–52.0)
HEMOGLOBIN: 10.4 g/dL — AB (ref 13.0–17.0)
MCH: 28.5 pg (ref 26.0–34.0)
MCHC: 33 g/dL (ref 30.0–36.0)
MCV: 86.3 fL (ref 78.0–100.0)
Platelets: 276 10*3/uL (ref 150–400)
RBC: 3.65 MIL/uL — AB (ref 4.22–5.81)
RDW: 14.6 % (ref 11.5–15.5)
WBC: 8.9 10*3/uL (ref 4.0–10.5)

## 2013-10-25 LAB — BASIC METABOLIC PANEL
BUN: 12 mg/dL (ref 6–23)
CO2: 26 mEq/L (ref 19–32)
Calcium: 9.6 mg/dL (ref 8.4–10.5)
Chloride: 101 mEq/L (ref 96–112)
Creatinine, Ser: 0.93 mg/dL (ref 0.50–1.35)
GFR, EST NON AFRICAN AMERICAN: 87 mL/min — AB (ref >90)
Glucose, Bld: 88 mg/dL (ref 70–99)
POTASSIUM: 3.6 meq/L — AB (ref 3.7–5.3)
SODIUM: 140 meq/L (ref 137–147)

## 2013-10-25 LAB — URINE MICROSCOPIC-ADD ON

## 2013-10-25 LAB — RAPID URINE DRUG SCREEN, HOSP PERFORMED
Amphetamines: NOT DETECTED
BARBITURATES: NOT DETECTED
Benzodiazepines: NOT DETECTED
Cocaine: NOT DETECTED
OPIATES: NOT DETECTED
Tetrahydrocannabinol: NOT DETECTED

## 2013-10-25 LAB — URINALYSIS, ROUTINE W REFLEX MICROSCOPIC
Bilirubin Urine: NEGATIVE
Glucose, UA: NEGATIVE mg/dL
Ketones, ur: NEGATIVE mg/dL
NITRITE: NEGATIVE
PROTEIN: 100 mg/dL — AB
Specific Gravity, Urine: 1.018 (ref 1.005–1.030)
UROBILINOGEN UA: 0.2 mg/dL (ref 0.0–1.0)
pH: 6 (ref 5.0–8.0)

## 2013-10-25 LAB — PHOSPHORUS: PHOSPHORUS: 2.9 mg/dL (ref 2.3–4.6)

## 2013-10-25 LAB — GRAM STAIN: Special Requests: NORMAL

## 2013-10-25 LAB — TSH: TSH: 2.167 u[IU]/mL (ref 0.350–4.500)

## 2013-10-25 MED ORDER — MAGNESIUM SULFATE 40 MG/ML IJ SOLN
2.0000 g | Freq: Once | INTRAMUSCULAR | Status: AC
  Administered 2013-10-25: 2 g via INTRAVENOUS
  Filled 2013-10-25 (×2): qty 50

## 2013-10-25 MED ORDER — OXYCODONE-ACETAMINOPHEN 5-325 MG PO TABS
1.0000 | ORAL_TABLET | Freq: Three times a day (TID) | ORAL | Status: DC | PRN
  Administered 2013-10-25: 2 via ORAL
  Administered 2013-10-25: 1 via ORAL
  Administered 2013-10-26 – 2013-10-27 (×2): 2 via ORAL
  Filled 2013-10-25: qty 1
  Filled 2013-10-25 (×3): qty 2

## 2013-10-25 MED ORDER — POTASSIUM CHLORIDE CRYS ER 20 MEQ PO TBCR
40.0000 meq | EXTENDED_RELEASE_TABLET | Freq: Once | ORAL | Status: AC
  Administered 2013-10-25: 40 meq via ORAL
  Filled 2013-10-25: qty 2

## 2013-10-25 NOTE — Progress Notes (Signed)
Paged teaching service (Dr. Ronnald Ramp) concerning urine results per lab and also to notify that patient is having a strong productive cough. No new orders given.

## 2013-10-25 NOTE — Evaluation (Signed)
Physical Therapy Evaluation Patient Details Name: [name] MRN: [id number] DOB: 1951/05/30 Today's Date: 10/25/2013 Time: 3664-4034 PT Time Calculation (min): 27 min  PT Assessment / Plan / Recommendation History of Present Illness  [name] is a 63 y.o. male w/ PMHx of HTN, COPD, prostate cancer, stroke in 11/2012 who presented for oral surgery with Dr. Enrique Sack on 10/23/13, readmitted for weakness and AKI.  Clinical Impression  Patient demonstrates deficit in functional mobility and safety as indicated below. Patient will need acute PT services to address deficits and maximize function. Per patient was independent prior to admission including walking without assistive device.  At this time, patient demonstrates poor awareness of deficits as well as increased risk of fall and is requiring hands on physical assist for mobility.  Will continue to see as indicated and progress as tolerated. Recommend CIR consult pending results of medical workup.  Will need continued therapy post acute discharge.    PT Assessment  Patient needs continued PT services    Follow Up Recommendations  CIR;Supervision/Assistance - 24 hour          Equipment Recommendations  Rolling walker with 5" wheels       Frequency Min 3X/week    Precautions / Restrictions Precautions Precautions: Fall Restrictions Weight Bearing Restrictions: No   Pertinent Vitals/Pain VSS, patient reports abdominal pain 7/10       Mobility  Bed Mobility Overal bed mobility: Needs Assistance Bed Mobility: Supine to Sit;Sit to Supine Supine to sit: Min assist Sit to supine: Min assist General bed mobility comments: assist to come to EOB, difficulty elevating LEs back to bed, assist provided to elevate LLE and hold positioing so that patient could obtained supine Transfers Overall transfer level: Needs assistance Equipment used: Rolling walker (2 wheeled);1 person hand held assist Transfers: Sit to/from  Stand Sit to Stand: Mod assist General transfer comment: VCs for hand placement, difficulty coming to upright, poor control of LLE quads when coming to standing. better with use of rolling walker (min assist) for stability Ambulation/Gait Ambulation/Gait assistance: Mod assist Ambulation Distance (Feet): 46 Feet Assistive device: Rolling walker (2 wheeled);1 person hand held assist (attempted HHA, unsafe to ambulate, swtiched to RW) Gait Pattern/deviations: Decreased stance time - left;Shuffle;Decreased dorsiflexion - left;Trunk flexed Gait velocity: decreased Gait velocity interpretation: <1.8 ft/sec, indicative of risk for recurrent falls General Gait Details: LLE drag, decreased clearance, requires cues to attend to left leg during ambulation, VCs for stride of LLE, heavy reliance on RW. Stairs:  (not safe to perform at this time) Modified Rankin (Stroke Patients Only) Pre-Morbid Rankin Score: Slight disability Modified Rankin: Moderately severe disability    Exercises     PT Diagnosis: Difficulty walking;Abnormality of gait;Generalized weakness;Acute pain  PT Problem List: Decreased strength;Decreased range of motion;Decreased activity tolerance;Decreased balance;Decreased mobility;Decreased coordination;Decreased knowledge of use of DME;Decreased safety awareness PT Treatment Interventions: DME instruction;Gait training;Stair training;Functional mobility training;Therapeutic activities;Therapeutic exercise;Balance training;Patient/family education     PT Goals(Current goals can be found in the care plan section) Acute Rehab PT Goals Patient Stated Goal: none stated PT Goal Formulation: With patient Time For Goal Achievement: 11/08/13 Potential to Achieve Goals: Good  Visit Information  Last PT Received On: 10/25/13 Assistance Needed: +1 History of Present Illness: [name] is a 63 y.o. male w/ PMHx of HTN, COPD, prostate cancer, stroke in 11/2012 who presented for oral  surgery with Dr. Enrique Sack on 10/23/13, readmitted for weakness and AKI.       Prior  Functioning  Home Living Family/patient expects to be discharged to:: Private residence Living Arrangements: Non-relatives/Friends Available Help at Discharge: Friend(s) Type of Home: House Home Access: Stairs to enter Technical brewer of Steps: 2 Entrance Stairs-Rails: None Home Layout: One level Home Equipment: None Prior Function Level of Independence: Independent Communication Communication:  (slurred speech secondary to having had all teeth pulled) Dominant Hand: Right    Cognition  Cognition Arousal/Alertness: Awake/alert Behavior During Therapy: WFL for tasks assessed/performed Overall Cognitive Status: Within Functional Limits for tasks assessed    Extremity/Trunk Assessment Upper Extremity Assessment Upper Extremity Assessment: Defer to OT evaluation Lower Extremity Assessment Lower Extremity Assessment: LLE deficits/detail LLE Deficits / Details: Asymetrical weakness, LLE 3+/5. Decreased dorsiflexion. Poor attention to left side during activity, required several cues for step with LLE LLE Sensation: decreased proprioception LLE Coordination: decreased fine motor;decreased gross motor (patient unable to coordinate LLE for gait)   Balance General Comments General comments (skin integrity, edema, etc.): patient also complaining of abdominal pain  End of Session PT - End of Session Equipment Utilized During Treatment: Gait belt Activity Tolerance: Patient limited by fatigue Patient left: in bed;with call bell/phone within reach;with bed alarm set Nurse Communication: Mobility status  GP     Duncan Dull 10/25/2013, 9:36 AM Alben Deeds, PT DPT  [phone]

## 2013-10-25 NOTE — Clinical Social Work Psychosocial (Signed)
Clinical Social Work Department BRIEF PSYCHOSOCIAL ASSESSMENT 10/25/2013  Patient:  [name], [name]     Account Number:  [id number]     Admit date:  10/24/2013  Clinical Social Worker:  Wylene Men  Date/Time:  10/25/2013 01:54 PM  Referred by:  Physician  Date Referred:  10/25/2013 Referred for  SNF Placement   Other Referral:   none   Interview type:  Patient Other interview type:   none    PSYCHOSOCIAL DATA Living Status:  FRIEND(S) Admitted from facility:   Level of care:   Primary support name:  Crystal Primary support relationship to patient:   Degree of support available:   adequate    CURRENT CONCERNS Current Concerns  Post-Acute Placement   Other Concerns:   none    SOCIAL WORK ASSESSMENT / PLAN CSW received consult for placement.  PT/OT are recommending STR/SNF upon medical dc from hospital.  CSW met with pt at bedside.  Pt was lying quietly during assessment.  Pt was difficult to understand, but did communicate effectively.    Pt was alert and oriented during the time of the assessment.  Pt expressed frustration with his current medical condition and states that he just wants to get better.  CSW acknowleged the frustration and offered support and encouragement.    Pt was agreeable to SNF placement; however, was unaware of a facility choice.  CSW went over insurance- Medicaid non-payment of SNF and how a letter of guarantee from [facility] would work on his behalf for a safe dc.  Pt understands that the SNF/STR may not be local.  Pt is agreeable and appreciative of support and assistance on receiving the STR he needs after being dc.   Assessment/plan status:  Psychosocial Support/Ongoing Assessment of Needs Other assessment/ plan:   none   Information/referral to community resources:   SNF    PATIENT'S/FAMILY'S RESPONSE TO PLAN OF CARE: Pt was agreeable to SNF and extended search for possible LOG placement.

## 2013-10-25 NOTE — Evaluation (Signed)
Occupational Therapy Evaluation Patient Details Name: [name] MRN: [id number] DOB: Nov 09, 1950 Today's Date: 10/25/2013 Time: 6270-3500 OT Time Calculation (min): 18 min  OT Assessment / Plan / Recommendation History of present illness [name] is a 63 y.o. male w/ PMHx of HTN, COPD, prostate cancer, stroke in 11/2012 who presented for oral surgery with Dr. Enrique Sack on 10/23/13, readmitted for weakness and AKI.   Clinical Impression   Pt presents with below problem list. Pt independent with ADLs, PTA. Feel pt will benefit from acute OT to increase independence prior to d/c.     OT Assessment  Patient needs continued OT Services    Follow Up Recommendations  CIR    Barriers to Discharge      Equipment Recommendations  Other (comment) (defer to next venue)    Recommendations for Other Services Rehab consult  Frequency  Min 2X/week    Precautions / Restrictions Precautions Precautions: Fall Restrictions Weight Bearing Restrictions: No   Pertinent Vitals/Pain Pain in legs. Nurse notified.     ADL  Grooming: Wash/dry face;Wash/dry hands;Min guard Where Assessed - Grooming: Supported standing Upper Body Bathing: Min guard;Left arm;Right arm;Chest Where Assessed - Upper Body Bathing: Unsupported standing Lower Body Bathing: Min guard (washed bottom and legs Min guard) Where Assessed - Lower Body Bathing: Supported sit to stand Upper Body Dressing: Set up Where Assessed - Upper Body Dressing: Unsupported sitting Lower Body Dressing: Minimal assistance Where Assessed - Lower Body Dressing: Supported sit to stand Toilet Transfer: Minimal assistance;Min guard (Min A to ambulate; Min guard for actual sit <> stand transfe) Toilet Transfer Method: Sit to Loss adjuster, chartered: Comfort height toilet;Grab bars;Bedside commode Tub/Shower Transfer Method: Not assessed Equipment Used: Gait belt;Rolling walker Transfers/Ambulation Related to ADLs: Min-Mod A for  ambulation. Min guard for transfers. ADL Comments: Recommended sitting for bathing and dressing. OT assisted him with sock.    OT Diagnosis: Generalized weakness;Acute pain  OT Problem List: Decreased strength;Impaired balance (sitting and/or standing);Decreased safety awareness;Decreased knowledge of use of DME or AE;Decreased knowledge of precautions;Pain;Decreased activity tolerance;Decreased cognition OT Treatment Interventions: Self-care/ADL training;DME and/or AE instruction;Therapeutic activities;Patient/family education;Balance training;Cognitive remediation/compensation;Therapeutic exercise   OT Goals(Current goals can be found in the care plan section) Acute Rehab OT Goals Patient Stated Goal: get his legs back to working OT Goal Formulation: With patient Time For Goal Achievement: 11/01/13 Potential to Achieve Goals: Good ADL Goals Pt Will Perform Grooming: with modified independence;standing Pt Will Perform Lower Body Dressing: with modified independence;sit to/from stand Pt Will Transfer to Toilet: with modified independence;ambulating Pt Will Perform Toileting - Clothing Manipulation and hygiene: with modified independence;sit to/from stand  Visit Information  Last OT Received On: 10/25/13 Assistance Needed: +1 History of Present Illness: [name] is a 63 y.o. male w/ PMHx of HTN, COPD, prostate cancer, stroke in 11/2012 who presented for oral surgery with Dr. Enrique Sack on 10/23/13, readmitted for weakness and AKI.       Prior [place] expects to be discharged to:: Private residence Living Arrangements: Non-relatives/Friends Available Help at Discharge: Friend(s) Type of Home: House Home Access: Stairs to enter Technical brewer of Steps: 2 Entrance Stairs-Rails: None Home Layout: One level Home Equipment: None Prior Function Level of Independence: Independent Communication Communication: Expressive difficulties  (slurred speech secondary to having had all teeth pulled) Dominant Hand: Right         Vision/Perception     Cognition  Cognition Arousal/Alertness: Awake/alert Behavior During  Therapy: WFL for tasks assessed/performed Overall Cognitive Status: Impaired/Different from baseline Area of Impairment: Orientation;Following commands Orientation Level: Disoriented to;Time Following Commands: Follows one step commands inconsistently   Extremity/Trunk Assessment Upper Extremity Assessment Upper Extremity Assessment: Generalized weakness Lower Extremity Assessment Lower Extremity Assessment: Defer to PT evaluation     Mobility Bed Mobility Overal bed mobility: Needs Assistance Bed Mobility: Supine to Sit Supine to sit: Supervision General bed mobility comments: Cues to use rail and technique. Transfers Overall transfer level: Needs assistance Equipment used: Rolling walker (2 wheeled) Transfers: Sit to/from Stand Sit to Stand: Min assist;Min guard General transfer comment: Cues for hand placement.     Exercise     Balance     End of Session OT - End of Session Equipment Utilized During Treatment: Gait belt;Rolling walker Activity Tolerance: Patient tolerated treatment well Patient left: in bed;with call bell/phone within reach;with bed alarm set Nurse Communication: Other (comment) (pt sitting on bed eating with alarm set)  GO     Benito Mccreedy OTR/L [phone] 10/25/2013, 5:22 PM

## 2013-10-25 NOTE — Clinical Social Work Placement (Signed)
Clinical Social Work Department CLINICAL SOCIAL WORK PLACEMENT NOTE 10/25/2013  Patient:  [name], [name]  Account Number:  [id number] Admit date:  10/24/2013  Clinical Social Worker:  Wylene Men  Date/time:  10/25/2013 02:21 PM  Clinical Social Work is seeking post-discharge placement for this patient at the following level of care:   SKILLED NURSING   (*CSW will update this form in Epic as items are completed)   10/25/2013  Patient/family provided with [facility] Department of Clinical Social Work's list of facilities offering this level of care within the geographic area requested by the patient (or if unable, by the patient's family).  10/25/2013  Patient/family informed of their freedom to choose among providers that offer the needed level of care, that participate in Medicare, Medicaid or managed care program needed by the patient, have an available bed and are willing to accept the patient.  10/25/2013  Patient/family informed of [facility]' ownership interest in [facility], as well as of the fact that they are under no obligation to receive care at this facility.  PASARR submitted to [employer] on PASARR is existing  PASARR number received from [employer] on  existing  FL2 transmitted to all facilities in geographic area requested by pt/family on  10/25/2013 FL2 transmitted to all facilities within larger geographic area on 10/25/2013  Patient informed that his/her managed care company has contracts with or will negotiate with  certain facilities, including the following:     Patient/family informed of bed offers received:   Patient chooses bed at  Physician recommends and patient chooses bed at    Patient to be transferred to  on   Patient to be transferred to facility by   The following physician request were entered in Epic:   Additional Comments:  Nonnie Done, [place] [phone]  Clinical Social Work

## 2013-10-25 NOTE — Progress Notes (Signed)
Subjective: [name] is doing fine this morning.  States that he has chronic lower extremity weakness, especially in his left leg, with chronic falls though has been able to get around some with cane at home.  He did not want to eat his breakfast today since his mouth is still sore since surgery, encouraged him to eat lunch to maintain nutrition.   Denies back pain, numbness/tingling, incontinence to urine or stool, lightheadedness. No urinary symptoms including dysuria, frequency, urgency, hematuria.   Objective: Vital signs in last 24 hours: Filed Vitals:   10/24/13 1800 10/24/13 1959 10/24/13 2200 10/25/13 0315  BP:  130/63 134/82 116/78  Pulse:   95 92  Temp: 100 F (37.8 C)  98 F (36.7 C) 98.6 F (37 C)  TempSrc: Rectal  Oral Oral  Resp:  20 20 18   Height:   5\' 8"  (1.727 m)   Weight:   169 lb 8 oz (76.885 kg)   SpO2:  100% 97% 98%   PEX General: alert, cooperative, and in no apparent distress HEENT: NCAT, vision grossly intact, mild oral swelling, no bleeding Neck: supple, no lymphadenopathy Lungs: clear to ascultation bilaterally, normal work of respiration, no wheezes, rales, ronchi Heart: regular rate and rhythm, no murmurs, gallops, or rubs Abdomen: soft, non-tender, non-distended, normal bowel sounds Extremities: 2+ DP/PT pulses bilaterally, no cyanosis, clubbing, or edema Neurologic: alert & oriented X3, cranial nerves II-XII intact, strength grossly intact, sensation intact to light touch Left leg 3+-4/5, right leg 4/5, sensation intact throughout; no point tenderness along spine  Lab Results: Basic Metabolic Panel:  Recent Labs Lab 10/24/13 2215 10/24/13 2245 10/25/13 0520  NA  --  138 140  K  --  3.5* 3.6*  CL  --  101 101  CO2  --  25 26  GLUCOSE  --  98 88  BUN  --  13 12  CREATININE  --  1.13 0.93  CALCIUM  --  9.4 9.6  MG  --  1.6  --   PHOS 2.9  --   --    Liver Function Tests:  Recent Labs Lab 10/24/13 2245  AST 27  ALT 21  ALKPHOS 55   BILITOT 0.5  PROT 6.8  ALBUMIN 2.7*    Recent Labs Lab 10/24/13 2245  LIPASE 14   CBC:  Recent Labs Lab 10/23/13 1535  10/24/13 1706 10/25/13 0520  WBC 8.3  < > 9.2 8.9  NEUTROABS 3.7  --   --   --   HGB 12.2*  < > 12.1* 10.4*  HCT 36.5*  < > 36.5* 31.5*  MCV 85.9  < > 85.9 86.3  PLT 290  < > 260 276  < > = values in this interval not displayed.  Cardiac Enzymes:  Recent Labs Lab 10/24/13 2245  CKTOTAL 162   CBG:  Recent Labs Lab 10/24/13 1647  GLUCAP 113*   Urine Drug Screen: Drugs of Abuse     Component Value Date/Time   LABOPIA NONE DETECTED 10/25/2013 0410   LABOPIA NEGATIVE 10/14/2010 2140   COCAINSCRNUR NONE DETECTED 10/25/2013 0410   COCAINSCRNUR  Value: POSITIVE (NOTE) Result repeated and verified. Sent for confirmatory testing* 10/14/2010 2140   LABBENZ NONE DETECTED 10/25/2013 0410   LABBENZ NEGATIVE 10/14/2010 2140   AMPHETMU NONE DETECTED 10/25/2013 0410   AMPHETMU NEGATIVE 10/14/2010 2140   THCU NONE DETECTED 10/25/2013 0410   LABBARB NONE DETECTED 10/25/2013 0410    Urinalysis:  Recent Labs Lab 10/25/13 0410  COLORURINE YELLOW  LABSPEC 1.018  PHURINE 6.0  GLUCOSEU NEGATIVE  HGBUR LARGE*  BILIRUBINUR NEGATIVE  KETONESUR NEGATIVE  PROTEINUR 100*  UROBILINOGEN 0.2  NITRITE NEGATIVE  LEUKOCYTESUR MODERATE*    Micro Results: Recent Results (from the past 240 hour(s))  GRAM STAIN     Status: None   Collection Time    10/25/13  4:11 AM      Result Value Ref Range Status   Specimen Description URINE, CLEAN CATCH   Final   Special Requests Normal   Final   Gram Stain     Final   Value: [id number]     WBC PRESENT,BOTH PMN AND MONONUCLEAR     Multiple bacterial morphotypes present, none predominant. Suggest appropriate recollection if clinically indicated.     YEAST     Results Called toZelphia Cairo RN [phone] Perlie Mayo   Report Status 10/25/2013 FINAL   Final   Studies/Results: Dg Chest 2 View  10/24/2013   CLINICAL DATA:  Extremity  weakness  EXAM: CHEST  2 VIEW  COMPARISON:  NM BONE WHOLE BODY dated 12/26/2012; DG CHEST 2 VIEW dated 12/17/2012  FINDINGS: Normal cardiac silhouette. There is left lower lobe opacity representing atelectasis or infiltrate. Right lung is clear. No pneumothorax. Low lung volumes.  IMPRESSION: Right base atelectasis versus infiltrate.   Electronically Signed   By: Suzy Bouchard M.D.   On: 10/24/2013 18:33   Ct Head Wo Contrast  10/24/2013   CLINICAL DATA:  Left-sided weakness  EXAM: CT HEAD WITHOUT CONTRAST  TECHNIQUE: Contiguous axial images were obtained from the base of the skull through the vertex without intravenous contrast.  COMPARISON:  NM BONE WHOLE BODY dated 12/26/2012; CT HEAD W/O CM dated 12/07/2012  FINDINGS: There is no evidence of mass effect, midline shift, or extra-axial fluid collections. There is no evidence of a space-occupying lesion or intracranial hemorrhage. There is no evidence of a cortical-based area of acute infarction. There is an old right cerebellar infarct with encephalomalacia. There is generalized cerebral atrophy. There is periventricular white matter low attenuation likely secondary to microangiopathy.  The ventricles and sulci are appropriate for the patient's age. The basal cisterns are patent.  Visualized portions of the orbits are unremarkable. There is mild bilateral ethmoid sinus mucosal thickening. There is complete opacification of the left maxillary sinus. There is mild right maxillary sinus mucosal thickening. The sphenoid sinuses are not pneumatized. There is mild mucosal thickening in the right frontoethmoidal recess. Cerebrovascular atherosclerotic calcifications are noted.  The osseous structures are unremarkable.  IMPRESSION: 1. No acute intracranial pathology. 2. Sinus disease as described above. 3. Chronic right cerebellar infarct.   Electronically Signed   By: Kathreen Devoid   On: 10/24/2013 18:22   Medications: I have reviewed the patient's current  medications. Scheduled Meds: . atorvastatin  80 mg Oral q1800  . clopidogrel  75 mg Oral Q breakfast  . magnesium sulfate 1 - 4 g bolus IVPB  2 g Intravenous Once  . multivitamin with minerals  1 tablet Oral Daily  . potassium chloride  40 mEq Oral Once  . tiotropium  18 mcg Inhalation Daily   Continuous Infusions: . sodium chloride 75 mL/hr at 10/25/13 0423   PRN Meds:.albuterol, oxyCODONE-acetaminophen Assessment/Plan: #Lower extremity weakness- Likely 2/2 deconditioning/dehydration in the setting of recent surgery and decreased PO intake given 22 tooth extractions on 3/16.   Patient has history of strokes (most recent in 11/2012) with baseline LLE weakness for which he uses cane at  home.  He is neurologically intact with exception of mildly decreased strength in bilateral lower extremities L>R (exam quite variable and seems dependent on patient effort).  Very little concern for spinal cord etiology of symptoms given that he neurologically intact (including no saddle anesthesia, loss of incontinence to urine or stool), no back pain, or point tenderness of spine.  Lumbar spine xray read pending.  After further discussion, decided to order MRI lumbar spine as well for further evaluation.  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   Also unlikely that patient's weakness represents recurrent stroke as his weakness is bilateral and has been ongoing (though worse than before), no other symptoms, CT head negative for acute intracranial abnormality.  Of note, patient was off Plavix temporarily prior to procedure.    Other workup: Lactic acid within normal limits. Mg 1.6 and Phos 2.9 (both within normal limits).  UDS negative. CXR showed right base atelectasis.  UA dirty with many squams, culture pending.  -PT/OT rec CIR --> CIR consult placed, eval pending; have also spoken to social work Barnett Applebaum, [phone]) in case CIR denies patient  given that patient amenable to SNF  -MRI lumbar spine pending -continue NS at 75 cc/hr for now given decreased PO intake -blood cultures x 2, urine culture pending  #AKI, resolved- Likely pre-renal given recent surgery with limited PO intake.  Cr 0.80 at baseline, 1.25 on admission --> 0.93 today.  Of note, Cr on morning of discharge (<12 hours prior to re-admission labs) 0.82.  CK within normal limits -continue holding HCTZ and Lisinopril for now; will likely restart one or both tomorrow  #Recent teeth extractions- Oral surgery with Dr. Enrique Sack on 10/23/13 for multiple teeth extractions (22) with alveoplasty and pre-prosthetic surgery under general anesthesia. Swelling significantly improved from yesterday.  Do not suspect infection at this time.  Hgb stable at 10.4 (10.7 at discharge yesterday, 12.1 on admission).  -dysphagia 1 diet -salt water rinses q2h while awake  #HTN- Patient at goal for age.  On HCTZ 25 mg daily and lisinopril 10 mg daily at home.  -holding home meds per above  #COPD- Stable, lungs CTAB, no concern for acute exacerbation at this time.  -continue Spiriva daily, Albuterol prn   #Prostate cancer- Patient following with Dr. Janice Norrie, see above. Per patient, last PSA <1. Follow-up outpatient.   #History of stroke (right centrum semiovale)- Stable, patient has documented allergy to ASA, usually on Plavix at home. Plavix held s/p extractions yesterday. CT on admission with no acute findings per above.  -continue Plavix per dentistry recs   #Bed bugs- Patient found to have bed bugs on admission 3/16.  Already counseled patient on this.  -continue contact isolation   Dispo: Disposition is deferred at this time, awaiting improvement of current medical problems.  Anticipated discharge tomorrow.    The patient does have a current PCP Corky Sox, MD) and does need an [facility] hospital follow-up appointment after discharge.   .Services Needed at time of discharge: Y = Yes, Blank  = No PT:   OT:   RN:   Equipment:   Other:     LOS: 1 day   Ivin Poot, MD 10/25/2013, 9:04 AM

## 2013-10-25 NOTE — Progress Notes (Signed)
UR complete.  Youssouf Shipley RN, MSN 

## 2013-10-25 NOTE — Consult Note (Signed)
Physical Medicine and Rehabilitation Consult Reason for Consult: Deconditioning Referring Physician: [facility]   HPI: [name] is a 63 y.o. 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 Presented 10/24/2013 after patient had presented for oral surgery with Dr. Enrique Sack on 10/23/2013 for multiple tooth extractions and was discharged to home after procedure and later presented to the emergency department with significant lower extremity weakness in him getting his ability to ambulate. Patient used a cane at baseline. Cranial CT scan negative for acute changes there was a chronic right cerebellar infarct. Lumbar spine films pending. Urinalysis negative. Blood cultures pending. Patient remains on Plavix as prior to admission. Presently on a dysphagia 1 thin liquid diet after oral surgery. Contact precautions for bed bugs. Physical therapy evaluation completed 10/25/2013 with recommendations of physical medicine rehabilitation consult.   Review of Systems  Respiratory: Positive for shortness of breath.   Musculoskeletal: Positive for myalgias.  Neurological: Positive for weakness.  All other systems reviewed and are negative.   Past Medical History  Diagnosis Date  . Hypertension 05/18/2007  . COPD 05/18/2007  . Elevated PSA 05/25/2007  . 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 Stroke 12/05/2012  . Prostate cancer    Past Surgical History  Procedure Laterality Date  . 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 Multiple extractions with alveoloplasty N/A 10/23/2013    Procedure: Extraction of tooth #'s 1,2,3,4,5,6,7,11,12,13,14,15,16,20,21,22,23,24,27,28,29,32 with alveoloplasty and bilateral maxillary tuberosity reductions;  Surgeon: Lenn Cal, DDS;  Location: [facility];  Service: Oral Surgery;  Laterality:  N/A;   Family History  Problem Relation Age of Onset  . Liver disease Mother   . Heart attack Mother 16  . Colon cancer Neg Hx    Social History:  reports that he has been smoking Cigarettes.  He has a 25 pack-year smoking history. He has never used smokeless tobacco. He reports that he drinks about 7.0 ounces of alcohol per week. He reports that he does not use illicit drugs. Allergies:  Allergies  Allergen Reactions  . Aspirin Hives  . Penicillins Hives   Medications Prior to Admission  Medication Sig Dispense Refill  . albuterol (PROVENTIL HFA;VENTOLIN HFA) 108 (90 BASE) MCG/ACT inhaler Inhale 2 puffs into the lungs every 6 (six) hours as needed for wheezing.  8.5 g  3  . atorvastatin (LIPITOR) 80 MG tablet Take 1 tablet (80 mg total) by mouth daily at 6 PM.  30 tablet  5  . CALCIUM PO Take 1 tablet by mouth daily.      . clopidogrel (PLAVIX) 75 MG tablet Take 1 tablet (75 mg total) by mouth daily with breakfast.      . hydrochlorothiazide (HYDRODIURIL) 25 MG tablet Take 1 tablet (25 mg total) by mouth daily.  30 tablet  5  . lisinopril (PRINIVIL,ZESTRIL) 10 MG tablet Take 1 tablet (10 mg total) by mouth daily.  30 tablet  5  . Multiple Vitamin (MULTIVITAMIN WITH MINERALS) TABS tablet Take 1 tablet by mouth daily.      Marland Kitchen oxyCODONE-acetaminophen (PERCOCET/ROXICET) 5-325 MG per tablet Take 1-2 tablets by mouth every 4 (four) hours as needed for moderate pain.  35 tablet  0  . tiotropium (SPIRIVA HANDIHALER) 18 MCG inhalation capsule Place 1 capsule (18 mcg total) into inhaler and inhale daily.  30 capsule  5  Home: Home Living Family/patient expects to be discharged to:: Private residence Living Arrangements: Non-relatives/Friends Available Help at Discharge: Friend(s) Type of Home: House Home Access: Stairs to enter Technical brewer of Steps: 2 Entrance Stairs-Rails: None Home Layout: One level Home Equipment: None  Functional History:   Functional Status:    Mobility:     Ambulation/Gait Ambulation Distance (Feet): 46 Feet Gait velocity: decreased General Gait Details: LLE drag, decreased clearance, requires cues to attend to left leg during ambulation, VCs for stride of LLE, heavy reliance on RW.    ADL:    Cognition: Cognition Overall Cognitive Status: Within Functional Limits for tasks assessed Orientation Level: Disoriented to time;Oriented to person;Oriented to place;Oriented to situation Cognition Arousal/Alertness: Awake/alert Behavior During Therapy: WFL for tasks assessed/performed Overall Cognitive Status: Within Functional Limits for tasks assessed  Blood pressure 116/78, pulse 92, temperature 98.6 F (37 C), temperature source Oral, resp. rate 18, height 5\' 8"  (1.727 m), weight 76.885 kg (169 lb 8 oz), SpO2 97.00%. Physical Exam  Vitals reviewed. Constitutional:  63 year old African American male in no acute distress  HENT:  Noted facial swelling and edema after recent oral surgery  Eyes: EOM are normal.  Neck: Normal range of motion. Neck supple. No thyromegaly present.  Cardiovascular: Normal rate and regular rhythm.   Respiratory: Effort normal and breath sounds normal. No respiratory distress.  GI: Soft. Bowel sounds are normal. He exhibits no distension.  Neurological:  Patient is alert. His mood is flat but appropriate. He was oriented x3 and follow commands. Speech dysarthric. Strength 4/5 UE and 4/5 LE's today. Sensory exam grossly intact.  Skin: Skin is warm and dry.  Psychiatric: He has a normal mood and affect. His behavior is normal. Judgment and thought content normal.    Results for orders placed during the hospital encounter of 10/24/13 (from the past 24 hour(s))  CBG MONITORING, ED     Status: Abnormal   Collection Time    10/24/13  4:47 PM      Result Value Ref Range   Glucose-Capillary 113 (*) 70 - 99 mg/dL  CBC     Status: Abnormal   Collection Time    10/24/13  5:06 PM      Result Value  Ref Range   WBC 9.2  4.0 - 10.5 K/uL   RBC 4.25  4.22 - 5.81 MIL/uL   Hemoglobin 12.1 (*) 13.0 - 17.0 g/dL   HCT 36.5 (*) 39.0 - 52.0 %   MCV 85.9  78.0 - 100.0 fL   MCH 28.5  26.0 - 34.0 pg   MCHC 33.2  30.0 - 36.0 g/dL   RDW 14.8  11.5 - 15.5 %   Platelets 260  150 - 400 K/uL  BASIC METABOLIC PANEL     Status: Abnormal   Collection Time    10/24/13  5:15 PM      Result Value Ref Range   Sodium 139  137 - 147 mEq/L   Potassium 3.7  3.7 - 5.3 mEq/L   Chloride 100  96 - 112 mEq/L   CO2 26  19 - 32 mEq/L   Glucose, Bld 113 (*) 70 - 99 mg/dL   BUN 13  6 - 23 mg/dL   Creatinine, Ser 1.25  0.50 - 1.35 mg/dL   Calcium 9.9  8.4 - 10.5 mg/dL   GFR calc non Af Amer 59 (*) >90 mL/min   GFR calc Af Amer 69 (*) >90 mL/min  I-STAT CG4 LACTIC ACID, ED  Status: None   Collection Time    10/24/13  6:08 PM      Result Value Ref Range   Lactic Acid, Venous 2.13  0.5 - 2.2 mmol/L  PHOSPHORUS     Status: None   Collection Time    10/24/13 10:15 PM      Result Value Ref Range   Phosphorus 2.9  2.3 - 4.6 mg/dL  COMPREHENSIVE METABOLIC PANEL     Status: Abnormal   Collection Time    10/24/13 10:45 PM      Result Value Ref Range   Sodium 138  137 - 147 mEq/L   Potassium 3.5 (*) 3.7 - 5.3 mEq/L   Chloride 101  96 - 112 mEq/L   CO2 25  19 - 32 mEq/L   Glucose, Bld 98  70 - 99 mg/dL   BUN 13  6 - 23 mg/dL   Creatinine, Ser 1.13  0.50 - 1.35 mg/dL   Calcium 9.4  8.4 - 10.5 mg/dL   Total Protein 6.8  6.0 - 8.3 g/dL   Albumin 2.7 (*) 3.5 - 5.2 g/dL   AST 27  0 - 37 U/L   ALT 21  0 - 53 U/L   Alkaline Phosphatase 55  39 - 117 U/L   Total Bilirubin 0.5  0.3 - 1.2 mg/dL   GFR calc non Af Amer 67 (*) >90 mL/min   GFR calc Af Amer 78 (*) >90 mL/min  LIPASE, BLOOD     Status: None   Collection Time    10/24/13 10:45 PM      Result Value Ref Range   Lipase 14  11 - 59 U/L  MAGNESIUM     Status: None   Collection Time    10/24/13 10:45 PM      Result Value Ref Range   Magnesium 1.6  1.5 -  2.5 mg/dL  CK     Status: None   Collection Time    10/24/13 10:45 PM      Result Value Ref Range   Total CK 162  7 - 232 U/L  URINALYSIS, ROUTINE W REFLEX MICROSCOPIC     Status: Abnormal   Collection Time    10/25/13  4:10 AM      Result Value Ref Range   Color, Urine YELLOW  YELLOW   APPearance CLOUDY (*) CLEAR   Specific Gravity, Urine 1.018  1.005 - 1.030   pH 6.0  5.0 - 8.0   Glucose, UA NEGATIVE  NEGATIVE mg/dL   Hgb urine dipstick LARGE (*) NEGATIVE   Bilirubin Urine NEGATIVE  NEGATIVE   Ketones, ur NEGATIVE  NEGATIVE mg/dL   Protein, ur 100 (*) NEGATIVE mg/dL   Urobilinogen, UA 0.2  0.0 - 1.0 mg/dL   Nitrite NEGATIVE  NEGATIVE   Leukocytes, UA MODERATE (*) NEGATIVE  URINE RAPID DRUG SCREEN (HOSP PERFORMED)     Status: None   Collection Time    10/25/13  4:10 AM      Result Value Ref Range   Opiates NONE DETECTED  NONE DETECTED   Cocaine NONE DETECTED  NONE DETECTED   Benzodiazepines NONE DETECTED  NONE DETECTED   Amphetamines NONE DETECTED  NONE DETECTED   Tetrahydrocannabinol NONE DETECTED  NONE DETECTED   Barbiturates NONE DETECTED  NONE DETECTED  URINE MICROSCOPIC-ADD ON     Status: Abnormal   Collection Time    10/25/13  4:10 AM      Result Value Ref Range   Squamous  Epithelial / LPF MANY (*) RARE   WBC, UA 21-50  <3 WBC/hpf   RBC / HPF 11-20  <3 RBC/hpf   Bacteria, UA MANY (*) RARE   Casts HYALINE CASTS (*) NEGATIVE  GRAM STAIN     Status: None   Collection Time    10/25/13  4:11 AM      Result Value Ref Range   Specimen Description URINE, CLEAN CATCH     Special Requests Normal     Gram Stain       Value: [id number]     WBC PRESENT,BOTH PMN AND MONONUCLEAR     Multiple bacterial morphotypes present, none predominant. Suggest appropriate recollection if clinically indicated.     YEAST     Results Called toZelphia Cairo RN [phone]   Report Status 10/25/2013 FINAL    BASIC METABOLIC PANEL     Status: Abnormal   Collection Time    10/25/13   5:20 AM      Result Value Ref Range   Sodium 140  137 - 147 mEq/L   Potassium 3.6 (*) 3.7 - 5.3 mEq/L   Chloride 101  96 - 112 mEq/L   CO2 26  19 - 32 mEq/L   Glucose, Bld 88  70 - 99 mg/dL   BUN 12  6 - 23 mg/dL   Creatinine, Ser 0.93  0.50 - 1.35 mg/dL   Calcium 9.6  8.4 - 10.5 mg/dL   GFR calc non Af Amer 87 (*) >90 mL/min   GFR calc Af Amer >90  >90 mL/min  CBC     Status: Abnormal   Collection Time    10/25/13  5:20 AM      Result Value Ref Range   WBC 8.9  4.0 - 10.5 K/uL   RBC 3.65 (*) 4.22 - 5.81 MIL/uL   Hemoglobin 10.4 (*) 13.0 - 17.0 g/dL   HCT 31.5 (*) 39.0 - 52.0 %   MCV 86.3  78.0 - 100.0 fL   MCH 28.5  26.0 - 34.0 pg   MCHC 33.0  30.0 - 36.0 g/dL   RDW 14.6  11.5 - 15.5 %   Platelets 276  150 - 400 K/uL   Dg Chest 2 View  10/24/2013   CLINICAL DATA:  Extremity weakness  EXAM: CHEST  2 VIEW  COMPARISON:  NM BONE WHOLE BODY dated 12/26/2012; DG CHEST 2 VIEW dated 12/17/2012  FINDINGS: Normal cardiac silhouette. There is left lower lobe opacity representing atelectasis or infiltrate. Right lung is clear. No pneumothorax. Low lung volumes.  IMPRESSION: Right base atelectasis versus infiltrate.   Electronically Signed   By: Suzy Bouchard M.D.   On: 10/24/2013 18:33   Ct Head Wo Contrast  10/24/2013   CLINICAL DATA:  Left-sided weakness  EXAM: CT HEAD WITHOUT CONTRAST  TECHNIQUE: Contiguous axial images were obtained from the base of the skull through the vertex without intravenous contrast.  COMPARISON:  NM BONE WHOLE BODY dated 12/26/2012; CT HEAD W/O CM dated 12/07/2012  FINDINGS: There is no evidence of mass effect, midline shift, or extra-axial fluid collections. There is no evidence of a space-occupying lesion or intracranial hemorrhage. There is no evidence of a cortical-based area of acute infarction. There is an old right cerebellar infarct with encephalomalacia. There is generalized cerebral atrophy. There is periventricular white matter low attenuation likely  secondary to microangiopathy.  The ventricles and sulci are appropriate for the patient's age. The basal cisterns are patent.  Visualized portions  of the orbits are unremarkable. There is mild bilateral ethmoid sinus mucosal thickening. There is complete opacification of the left maxillary sinus. There is mild right maxillary sinus mucosal thickening. The sphenoid sinuses are not pneumatized. There is mild mucosal thickening in the right frontoethmoidal recess. Cerebrovascular atherosclerotic calcifications are noted.  The osseous structures are unremarkable.  IMPRESSION: 1. No acute intracranial pathology. 2. Sinus disease as described above. 3. Chronic right cerebellar infarct.   Electronically Signed   By: Kathreen Devoid   On: 10/24/2013 18:22    Assessment/Plan: Diagnosis: weakness, failure to thrive 1. Does the need for close, 24 hr/day medical supervision in concert with the patient's rehab needs make it unreasonable for this patient to be served in a less intensive setting? No 2. Co-Morbidities requiring supervision/potential complications: copd, hx of cva, htn 3. Due to bladder management, bowel management, safety, skin/wound care, disease management, medication administration, pain management and patient education, does the patient require 24 hr/day rehab nursing? No 4. Does the patient require coordinated care of a physician, rehab nurse, PT, OT to address physical and functional deficits in the context of the above medical diagnosis(es)? No Addressing deficits in the following areas: balance, endurance, strength and feeding 5. Can the patient actively participate in an intensive therapy program of at least 3 hrs of therapy per day at least 5 days per week? Potentially 6. The potential for patient to make measurable gains while on inpatient rehab is fair 7. Anticipated functional outcomes upon discharge from inpatient rehab are n/a with PT, n/a with OT, n/a with SLP. 8. Estimated rehab length of  stay to reach the above functional goals is: n/a 9. Does the patient have adequate social supports to accommodate these discharge functional goals? No 10. Anticipated D/C setting: other  11. Anticipated post D/C treatments: [place] therapy 12. Overall Rehab/Functional Prognosis: fair  RECOMMENDATIONS: This patient's condition is appropriate for continued rehabilitative care in the following setting: SNF Patient has agreed to participate in recommended program. Potentially Note that insurance prior authorization may be required for reimbursement for recommended care.  Comment: No medical necessity for CIR. Given hx and psychosocial dynamics, probably better suited for SNF regardless.  Meredith Staggers, MD, [facility]     10/25/2013

## 2013-10-25 NOTE — H&P (Signed)
INTERNAL MEDICINE TEACHING ATTENDING NOTE  Day 1 of stay  Patient name: [name]  MRN: [id number] Date of birth: Jan 30, 1951   63 y.o.man we discharged yesterday (was here under observation post op dental surgery); has significant past medical history of active prostate cancer with thoracic mets (PET scan in May 2014), has some weakness in his lower extremities especially left side, post earlier strokes; came back yesterday because he thought he could not lift his legs to get out of the car, as he reached home from the hospital, with a friend. The patient says his weakness was new since yesterday and his baseline weakness is not like this. He denies nausea, vomiting, fever, back pain, urinary complaints - incontinence or retention, stool incontinence. He has no difficulty speaking, no drooping of the mouth, no upper extremity weakness, no confusion or altered mental status, no visual changes, headaches. He feels sore in his mouth, but does not have any other pain complaints. So far, the story is consistent.   Here is the conflicting part - the patient's leg weakness has been graded differently by different examiners, and has been differently described by the patient to various people. When Dr. Eppie Gibson talked to the friend of the patient, he said that he is not able to move his legs at all which prompted him coming back to the ED.   The ED physician noted - LUE 3/5, RUE 4/5  LLE 1/5 RLL 2/5  Dr Luanne Bras noted -      LUE 5/5 RUE 5/5 LLE 3/5 RLE 4/5  Dr Ivin Poot noted - LUE 5/5 RUE 5/5 LLE 4/5 RLE 4/5  On my exam it was -         LUE 5/5 RUE 5/5 LLE 3/5 RLE 4/5  PT noted requiring assistance to elevate LLE and LLE drag.  Sensation is intact on LE, no upgoing plantars. CT head done which does not show new pathology.  At the time the patient came to the ED his blood pressure was marginally low and he was tachycardic.   With this exam and imaging, it is hard to comment if the patient had a  stroke or TIA or if this is cord compression. Was the ED exam a result of poor effort on the part of the patient because of deconditioning (low blood pressure, less intake etc) or was it a true weakness which should be pursued with an MRI given the background of 2 strokes in the past? However, in all the subsequent exams, the patient has some LLE weakness which probably was residual from previous strokes (but we have no way to know about worsening except patient report). Was RLE always 4/5 (the patient uses a cane at home to ambulate because of LE weakness) or is it new? And then, if the CT is clear - should we proceed with an MRI head to identify any smaller lesions, or should we proceed with an MRI thoraco-lumbar spine to visualize cord compression? However, the evidence for cord compression is clinically not pressing. The answers to these questions are vague at present, as the patient is not an extremely coherent historian. One last thought is if the patient is acting like this because has secondary gain and wants to stay in the hospital.  I think we will pursue this with MRI spine with contrast to rule out cord compression, given the history of prostate cancer and unclear new weakness in the legs per patient. For stroke, he is already on  secondary prevention measures, and undergoing physical therapy - if MRI spine is negative and the patient continues to be weaker than what he is at baseline - we will consider brain MRI.   Agree with Dr. Meryl Dare note. I have seen and evaluated this patient and discussed it with my IM resident team.  Please see the rest of the plan per resident note from today.   [place], [place] 10/25/2013, 11:36 AM.

## 2013-10-25 NOTE — Progress Notes (Signed)
Rehab Admissions Coordinator Note:  Patient was screened by Retta Diones for appropriateness for an Inpatient Acute Rehab Consult.  At this time, we are recommending Inpatient Rehab consult. A consult has already been ordered and is pending completion today.  Retta Diones 10/25/2013, 12:33 PM  I can be reached at [phone].

## 2013-10-26 ENCOUNTER — Inpatient Hospital Stay (HOSPITAL_COMMUNITY): Payer: Medicaid Other

## 2013-10-26 DIAGNOSIS — R5383 Other fatigue: Secondary | ICD-10-CM

## 2013-10-26 DIAGNOSIS — R5381 Other malaise: Secondary | ICD-10-CM

## 2013-10-26 DIAGNOSIS — I10 Essential (primary) hypertension: Secondary | ICD-10-CM

## 2013-10-26 LAB — URINE CULTURE
Colony Count: >=100000
SPECIAL REQUESTS: NORMAL

## 2013-10-26 MED ORDER — GADOBENATE DIMEGLUMINE 529 MG/ML IV SOLN
20.0000 mL | Freq: Once | INTRAVENOUS | Status: AC
  Administered 2013-10-26: 15 mL via INTRAVENOUS

## 2013-10-26 MED ORDER — GUAIFENESIN ER 600 MG PO TB12
600.0000 mg | ORAL_TABLET | Freq: Two times a day (BID) | ORAL | Status: DC | PRN
  Administered 2013-10-26: 600 mg via ORAL
  Filled 2013-10-26: qty 1

## 2013-10-26 MED ORDER — HYDROCHLOROTHIAZIDE 25 MG PO TABS
25.0000 mg | ORAL_TABLET | Freq: Every day | ORAL | Status: DC
  Administered 2013-10-26 – 2013-10-27 (×2): 25 mg via ORAL
  Filled 2013-10-26 (×2): qty 1

## 2013-10-26 NOTE — Consult Note (Addendum)
NEURO HOSPITALIST CONSULT NOTE    Reason for Consult: gait difficulty  HPI:                                                                                                                                          [name] is an 63 y.o. male who has been having difficulty with gait for years and used a cane at home,  He recently went for oral surgery 3.16.15 (Extraction of tooth #'s 1,2,3,4,5,6,7,11,12,13,14,15,16,20,21,22,23,24,27,28,29,32 with alveoloplasty and bilateral maxillary tuberosity reductions).  He states that after his surgery he was unable to walk due to LE weakness.  He states he would fall in multiple directions but not in one specific direction. Although I noted he had drooled on his shirt he denies having drooling problems now or prior to oral surgery.   Last MRI brain was in 11/2012 showing acute infarct of genu of the corpus callosum more to the right of midline MRI lumbar spine this hospitalization showed multifactorial mild spinous stenosis at L2-L3 and moderate lumbar neural foraminal stenosis.  CT brain obtained on this hospitalization shows a right cerebellar infarct not seen on previous MRI but not acute.   Past Medical History  Diagnosis Date  . Hypertension 05/18/2007  . COPD 05/18/2007  . Elevated PSA 05/25/2007  . 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 Stroke 12/05/2012  . Prostate cancer     Past Surgical History  Procedure Laterality Date  . 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 Multiple extractions with alveoloplasty N/A 10/23/2013    Procedure: Extraction of tooth #'s 1,2,3,4,5,6,7,11,12,13,14,15,16,20,21,22,23,24,27,28,29,32 with alveoloplasty and bilateral maxillary tuberosity reductions;  Surgeon: Lenn Cal, DDS;  Location: [facility];  Service: Oral Surgery;  Laterality: N/A;  . Leg surgery      pin  right leg    Family History  Problem Relation Age of Onset  . Liver disease Mother   . Heart attack Mother  38  . Colon cancer Neg Hx      Social History:  reports that he has been smoking Cigarettes.  He has a 25 pack-year smoking history. He has never used smokeless tobacco. He reports that he drinks about 7.0 ounces of alcohol per week. He reports that he does not use illicit drugs.  Allergies  Allergen Reactions  . Aspirin Hives  . Penicillins Hives    MEDICATIONS:  Prior to Admission:  Prescriptions prior to admission  Medication Sig Dispense Refill  . albuterol (PROVENTIL HFA;VENTOLIN HFA) 108 (90 BASE) MCG/ACT inhaler Inhale 2 puffs into the lungs every 6 (six) hours as needed for wheezing.  8.5 g  3  . atorvastatin (LIPITOR) 80 MG tablet Take 1 tablet (80 mg total) by mouth daily at 6 PM.  30 tablet  5  . CALCIUM PO Take 1 tablet by mouth daily.      . clopidogrel (PLAVIX) 75 MG tablet Take 1 tablet (75 mg total) by mouth daily with breakfast.      . hydrochlorothiazide (HYDRODIURIL) 25 MG tablet Take 1 tablet (25 mg total) by mouth daily.  30 tablet  5  . Multiple Vitamin (MULTIVITAMIN WITH MINERALS) TABS tablet Take 1 tablet by mouth daily.      Marland Kitchen oxyCODONE-acetaminophen (PERCOCET/ROXICET) 5-325 MG per tablet Take 1-2 tablets by mouth every 4 (four) hours as needed for moderate pain.  35 tablet  0  . tiotropium (SPIRIVA HANDIHALER) 18 MCG inhalation capsule Place 1 capsule (18 mcg total) into inhaler and inhale daily.  30 capsule  5  . [DISCONTINUED] lisinopril (PRINIVIL,ZESTRIL) 10 MG tablet Take 1 tablet (10 mg total) by mouth daily.  30 tablet  5   Scheduled: . atorvastatin  80 mg Oral q1800  . clopidogrel  75 mg Oral Q breakfast  . hydrochlorothiazide  25 mg Oral Daily  . multivitamin with minerals  1 tablet Oral Daily  . tiotropium  18 mcg Inhalation Daily     ROS:                                                                                                                                        History obtained from the patient  General ROS: negative for - chills, fatigue, fever, night sweats, weight gain or weight loss Psychological ROS: negative for - behavioral disorder, hallucinations, memory difficulties, mood swings or suicidal ideation Ophthalmic ROS: negative for - blurry vision, double vision, eye pain or loss of vision ENT ROS: negative for - epistaxis, nasal discharge, oral lesions, sore throat, tinnitus or vertigo Allergy and Immunology ROS: negative for - hives or itchy/watery eyes Hematological and Lymphatic ROS: negative for - bleeding problems, bruising or swollen lymph nodes Endocrine ROS: negative for - galactorrhea, hair pattern changes, polydipsia/polyuria or temperature intolerance Respiratory ROS: negative for - cough, hemoptysis, shortness of breath or wheezing Cardiovascular ROS: negative for - chest pain, dyspnea on exertion, edema or irregular heartbeat Gastrointestinal ROS: negative for - abdominal pain, diarrhea, hematemesis, nausea/vomiting or stool incontinence Genito-Urinary ROS: negative for - dysuria, hematuria, incontinence or urinary frequency/urgency Musculoskeletal ROS: negative for - joint swelling or muscular weakness Neurological ROS: as noted in HPI Dermatological ROS: negative for rash and skin lesion changes   Blood pressure 168/74, pulse 82, temperature 98.4 F (36.9 C), temperature source Axillary, resp. rate 18, height  5\' 8"  (1.727 m), weight 77.111 kg (170 lb), SpO2 94.00%.   Neurologic Examination:                                                                                                      Mental Status: Alert, oriented, thought content appropriate.  Speech dysarthric (recent oral surgery) without evidence of aphasia.  Able to follow 3 step commands without difficulty. Cranial Nerves: II: Discs flat bilaterally; Visual fields grossly normal, pupils equal, round, reactive to light and  accommodation III,IV, VI: ptosis not present, extra-ocular motions intact bilaterally V,VII: face symmetric, decreased facial expression, facial light touch sensation normal bilaterally VIII: hearing normal bilaterally IX,X: gag reflex present XI: bilateral shoulder shrug XII: midline tongue extension without atrophy or fasciculations  Motor: Right : Upper extremity   5/5                Left:   Upper extremity   5/5  Lower extremity   4/5 hip flexors, 5-/5 hamstrings           4/5 hip flexors and hamstrings Tone and bulk:normal tone throughout; no atrophy noted Sensory: Pinprick and light touch decreased from mid shin to feet with decreased vibratory and temperature sensation.  Deep Tendon Reflexes:  Right: Upper Extremity   Left: Upper extremity   biceps (C-5 to C-6) 2/4   biceps (C-5 to C-6) 2/4 tricep (C7) 2/4    triceps (C7) 2/4 Brachioradialis (C6) 2/4  Brachioradialis (C6) 2/4  Lower Extremity Lower Extremity  quadriceps (L-2 to L-4) 0/4   quadriceps (L-2 to L-4) 0/4 Achilles (S1) 0/4   Achilles (S1) 0/4  Plantars: Mute bilaterally Cerebellar: normal finger-to-nose,  normal heel-to-shin test but very slow in initiating movement Gait: could not get up from seated position without using arms, once up had wide based, short shuffling steps with body leaning forward.  CV: pulses palpable throughout    Lab Results: Basic Metabolic Panel:  Recent Labs Lab 10/23/13 1535 10/24/13 0635 10/24/13 1715 10/24/13 2215 10/24/13 2245 10/25/13 0520  NA 139 140 139  --  138 140  K 3.7 4.1 3.7  --  3.5* 3.6*  CL 101 102 100  --  101 101  CO2 24 26 26   --  25 26  GLUCOSE 102* 101* 113*  --  98 88  BUN 9 10 13   --  13 12  CREATININE 0.95 0.82 1.25  --  1.13 0.93  CALCIUM 9.8 9.5 9.9  --  9.4 9.6  MG  --   --   --   --  1.6  --   PHOS  --   --   --  2.9  --   --     Liver Function Tests:  Recent Labs Lab 10/24/13 2245  AST 27  ALT 21  ALKPHOS 55  BILITOT 0.5  PROT 6.8   ALBUMIN 2.7*    Recent Labs Lab 10/24/13 2245  LIPASE 14   No results found for this basename: AMMONIA,  in the last 168 hours  CBC:  Recent Labs  Lab 10/23/13 1535 10/24/13 0635 10/24/13 1706 10/25/13 0520  WBC 8.3 11.0* 9.2 8.9  NEUTROABS 3.7  --   --   --   HGB 12.2* 10.7* 12.1* 10.4*  HCT 36.5* 32.6* 36.5* 31.5*  MCV 85.9 86.5 85.9 86.3  PLT 290 287 260 276    Cardiac Enzymes:  Recent Labs Lab 10/24/13 2245  CKTOTAL 162    Lipid Panel: No results found for this basename: CHOL, TRIG, HDL, CHOLHDL, VLDL, LDLCALC,  in the last 168 hours  CBG:  Recent Labs Lab 10/24/13 [place] 113*    Microbiology: Results for orders placed during the hospital encounter of 10/24/13  CULTURE, BLOOD (ROUTINE X 2)     Status: None   Collection Time    10/24/13  6:45 PM      Result Value Ref Range Status   Specimen Description BLOOD LEFT ARM   Final   Special Requests BOTTLES DRAWN AEROBIC AND ANAEROBIC 5CC   Final   Culture  Setup Time     Final   Value: 10/25/2013 01:02     Performed at [facility]   Culture     Final   Value:        BLOOD CULTURE RECEIVED NO GROWTH TO DATE CULTURE WILL BE HELD FOR 5 DAYS BEFORE ISSUING A FINAL NEGATIVE REPORT     Performed at [facility]   Report Status PENDING   Incomplete  CULTURE, BLOOD (ROUTINE X 2)     Status: None   Collection Time    10/24/13  7:17 PM      Result Value Ref Range Status   Specimen Description BLOOD RIGHT FOREARM   Final   Special Requests BOTTLES DRAWN AEROBIC AND ANAEROBIC 5CC   Final   Culture  Setup Time     Final   Value: 10/25/2013 01:04     Performed at [facility]   Culture     Final   Value:        BLOOD CULTURE RECEIVED NO GROWTH TO DATE CULTURE WILL BE HELD FOR 5 DAYS BEFORE ISSUING A FINAL NEGATIVE REPORT     Performed at [facility]   Report Status PENDING   Incomplete  URINE CULTURE     Status: None   Collection Time    10/25/13  4:10 AM      Result  Value Ref Range Status   Specimen Description URINE, CLEAN CATCH   Final   Special Requests Normal   Final   Culture  Setup Time     Final   Value: 10/25/2013 08:05     Performed at [facility]     Final   Value: >=100,000 COLONIES/ML     Performed at [facility]   Culture     Final   Value: Multiple bacterial morphotypes present, none predominant. Suggest appropriate recollection if clinically indicated.     Performed at [facility]   Report Status 10/26/2013 FINAL   Final  GRAM STAIN     Status: None   Collection Time    10/25/13  4:11 AM      Result Value Ref Range Status   Specimen Description URINE, CLEAN CATCH   Final   Special Requests Normal   Final   Gram Stain     Final   Value: CYTOSPUN     WBC PRESENT,BOTH PMN AND MONONUCLEAR     Multiple bacterial morphotypes  present, none predominant. Suggest appropriate recollection if clinically indicated.     YEAST     Results Called toZelphia Cairo RN [id number] Perlie Mayo   Report Status 10/25/2013 FINAL   Final    Coagulation Studies: No results found for this basename: LABPROT, INR,  in the last 72 hours  Imaging: Dg Chest 2 View  10/24/2013   CLINICAL DATA:  Extremity weakness  EXAM: CHEST  2 VIEW  COMPARISON:  NM BONE WHOLE BODY dated 12/26/2012; DG CHEST 2 VIEW dated 12/17/2012  FINDINGS: Normal cardiac silhouette. There is left lower lobe opacity representing atelectasis or infiltrate. Right lung is clear. No pneumothorax. Low lung volumes.  IMPRESSION: Right base atelectasis versus infiltrate.   Electronically Signed   By: Suzy Bouchard M.D.   On: 10/24/2013 18:33   Dg Lumbar Spine Complete  10/25/2013   CLINICAL DATA:  Bilateral leg pain, history of prostate cancer  EXAM: LUMBAR SPINE - COMPLETE 4+ VIEW  COMPARISON:  None.  FINDINGS: Five views of lumbar spine submitted. No acute fracture or subluxation. Multilevel anterior spurring is noted. Mild disc space flattening at L4-L5  level. Moderate disc space flattening at L5-S1 level. Atherosclerotic calcifications of abdominal aorta and iliac arteries. Lateral spurring noted at L3-L4 level.  IMPRESSION: No acute fracture or subluxation. Multilevel anterior spurring. Disc space flattening at L4-L5 and L5-S1 level.   Electronically Signed   By: Lahoma Crocker M.D.   On: 10/25/2013 14:56   Ct Head Wo Contrast  10/24/2013   CLINICAL DATA:  Left-sided weakness  EXAM: CT HEAD WITHOUT CONTRAST  TECHNIQUE: Contiguous axial images were obtained from the base of the skull through the vertex without intravenous contrast.  COMPARISON:  NM BONE WHOLE BODY dated 12/26/2012; CT HEAD W/O CM dated 12/07/2012  FINDINGS: There is no evidence of mass effect, midline shift, or extra-axial fluid collections. There is no evidence of a space-occupying lesion or intracranial hemorrhage. There is no evidence of a cortical-based area of acute infarction. There is an old right cerebellar infarct with encephalomalacia. There is generalized cerebral atrophy. There is periventricular white matter low attenuation likely secondary to microangiopathy.  The ventricles and sulci are appropriate for the patient's age. The basal cisterns are patent.  Visualized portions of the orbits are unremarkable. There is mild bilateral ethmoid sinus mucosal thickening. There is complete opacification of the left maxillary sinus. There is mild right maxillary sinus mucosal thickening. The sphenoid sinuses are not pneumatized. There is mild mucosal thickening in the right frontoethmoidal recess. Cerebrovascular atherosclerotic calcifications are noted.  The osseous structures are unremarkable.  IMPRESSION: 1. No acute intracranial pathology. 2. Sinus disease as described above. 3. Chronic right cerebellar infarct.   Electronically Signed   By: Kathreen Devoid   On: 10/24/2013 18:22   Mr Lumbar Spine W Wo Contrast  10/26/2013   CLINICAL DATA:  63 year old male with severe low back pain and  difficulty walking. Initial encounter. History of metastatic prostate cancer.  EXAM: MRI LUMBAR SPINE WITHOUT AND WITH CONTRAST  TECHNIQUE: Multiplanar and multiecho pulse sequences of the lumbar spine were obtained without and with intravenous contrast.  CONTRAST:  98mL MULTIHANCE GADOBENATE DIMEGLUMINE 529 MG/ML IV SOLN  COMPARISON:  Lumbar radiographs 10/25/2013. Nuclear medicine whole-body bone scan 12/1912.  FINDINGS: T1 bone marrow signal is heterogeneous throughout the visible spine and pelvis. The appearance is highly suspicious for numerous small osseous metastases (e.g. Series 6, image 15 in the L2 vertebral body), however, no enhancing or overtly destructive  osseous lesion is identified.  Preserved lumbar vertebral height and alignment.  Visualized lower thoracic spinal cord is normal with conus medularis at L1-L2. No abnormal intradural enhancement.  Negative visualized abdominal viscera (simple appearing right renal midpole cyst).  The primary degenerative finding in the lumbar spine is disc bulging and epidural lipomatosis. Details:  T10-T11:  Mild disc desiccation.  T11-T12:  Negative.  T12-L1:  Negative.  L1-L2:  Mild disc bulge.  L2-L3: Left eccentric circumferential disc osteophyte complex. Mild facet hypertrophy. Mild to moderate epidural lipomatosis. Overall mild spinal stenosis (series 8, image 15). Mild to moderate left L2 foraminal stenosis.  L3-L4: Left eccentric circumferential disc bulge. Mild facet hypertrophy. No significant spinal stenosis. Mild to moderate bilateral L3 foraminal stenosis, greater on the right.  L4-L5: Circumferential disc osteophyte complex. Mild to moderate facet hypertrophy. Mild epidural lipomatosis. No spinal stenosis. Mild to moderate bilateral L4 foraminal stenosis, greater on the right.  L5-S1: Circumferential disc osteophyte complex. Mild facet hypertrophy. Epidural lipomatosis. No spinal stenosis. Mild left and moderate right L5 foraminal stenosis.  IMPRESSION:  1. Heterogeneous bone marrow signal throughout the visible spine and pelvis is highly suspicious for numerous small bone metastases in this setting, even though no destructive or enhancing lesion is identified. A repeat nuclear medicine whole-body bone scan to compare to that on 12/26/2012 would be valuable. 2. Multilevel lumbar spine degenerative changes. Multifactorial mild spinal stenosis at L2-L3. Multilevel multifactorial moderate lumbar neural foraminal stenosis.   Electronically Signed   By: Lars Pinks M.D.   On: 10/26/2013 11:33    Etta Quill PA-C [facility] [id number]  10/26/2013, 5:00 PM   Assessment/Plan: 63 year old man with history of gait abnormalities presenting with exacerbation of gait instability with mild weakness of lower extremities proximally. Deep tendon reflexes absent in both lower extremities. MRI study showed no signs of lumbosacral radiculopathy or cauda equina abnormality. Patient clinically has no signs of acute myelopathy. He's had no difficulty with urinary control with no continence nor urgency. He has not had a bowel movement however for 4-5 days. MRI has not shown recurrent acute stroke. Patient has a known mid-thoracic area of metastatic prostate cancer involvement. Again, clinically there is no indication of acute myelopathy. Weakness of his lower extremities is minimal and proximal in distribution. Guillain-Barr syndrome is unlikely.  Recommendations: 1. No clear clinical indication for thoracic or cervical MRI study. 2. Continue physical therapy intervention. 3. Serum CK level.  I personally participated in this patient's evaluation and management, including neurological examination, as well as formulating the above clinical assessment and management recommendations.  Rush Farmer M.D. [facility] [phone]

## 2013-10-26 NOTE — ED Provider Notes (Signed)
I have personally seen and examined the patient.  I have discussed the plan of care with the resident.  I have reviewed the documentation on PMH/FH/Soc. History.  I have reviewed the documentation of the resident and agree.  Orlie Dakin, MD 10/26/13 [phone]

## 2013-10-26 NOTE — Progress Notes (Signed)
Rehab admissions - Evaluated for possible admission.  Please see Dr. Naaman Plummer consult.  No medical necessity to support an acute inpatient rehab admission.  Recommendation is for SNF placement.  Call me for questions.  #[phone]

## 2013-10-26 NOTE — Social Work (Signed)
Continuing to seek SNF placement for patient at a facility that can accept his Medicaid and/or letter of guarantee- no offers confirmed at this time- will continue to seek options and advise.  Eduard Clos, MSW, [place]

## 2013-10-26 NOTE — Progress Notes (Signed)
PT Cancellation Note  Patient Details Name: ELIAZER HEMPHILL MRN: [id number] DOB: 03/16/1951   Cancelled Treatment:     Patient of the floor for procedure at this time.   Duncan Dull 10/26/2013, 10:08 AM

## 2013-10-26 NOTE — Progress Notes (Signed)
    Day 2 of stay      Patient name: [name]  Medical record number: [id number]  Date of birth: 10-24-1950  [name] sticks to his complaint of not being able to walk today as well. On exam, his LLE is 3/5 and RLE is 4/5 - unchanged from my last exam. His finger-nose test is normal. I do not know his baseline left lower extremity dysfunction from prior stroke, however he says he feels a decline. MRI lumbar spine reveals no cord compression, he has numerous small bone metastases and multilevel lumbar foraminal stenosis. OT report from yesterday deems him independent with ADLs. Dr. Stann Mainland exam reports LLE as 4/5 and RLE as 4+/5. At this point, and with this confusing scenario, I will get neurology consult to clarify any doubts remaining about this presentation.   Wise, [name] 10/26/2013, 2:27 PM.

## 2013-10-26 NOTE — Discharge Summary (Signed)
Name: [name] MRN: [id number] DOB: 01-Jun-1951 63 y.o. PCP: Corky Sox, MD  Date of Admission: 10/24/2013  5:05 PM Date of Discharge: 10/27/2013 Attending Physician: Madilyn Fireman, MD  Discharge Diagnosis: Principal Problem:   Generalized weakness Active Problems:   Hypertension   COPD   History of stroke   Prostate cancer   Aftercare following surgery of teeth, oral cavity or digestive system  Discharge Medications:   Medication List         albuterol 108 (90 BASE) MCG/ACT inhaler  Commonly known as:  PROVENTIL HFA;VENTOLIN HFA  Inhale 2 puffs into the lungs every 6 (six) hours as needed for wheezing.     atorvastatin 80 MG tablet  Commonly known as:  LIPITOR  Take 1 tablet (80 mg total) by mouth daily at 6 PM.     CALCIUM PO  Take 1 tablet by mouth daily.     clopidogrel 75 MG tablet  Commonly known as:  PLAVIX  Take 1 tablet (75 mg total) by mouth daily with breakfast.     hydrochlorothiazide 25 MG tablet  Commonly known as:  HYDRODIURIL  Take 1 tablet (25 mg total) by mouth daily.     lisinopril 10 MG tablet  Commonly known as:  PRINIVIL,ZESTRIL  Take 1 tablet (10 mg total) by mouth daily.     multivitamin with minerals Tabs tablet  Take 1 tablet by mouth daily.     oxyCODONE-acetaminophen 5-325 MG per tablet  Commonly known as:  PERCOCET/ROXICET  Take 1-2 tablets by mouth every 4 (four) hours as needed for moderate pain.     tiotropium 18 MCG inhalation capsule  Commonly known as:  SPIRIVA HANDIHALER  Place 1 capsule (18 mcg total) into inhaler and inhale daily.        Disposition and follow-up:   TerryRamie P Pittman was discharged from [facility] in Stable condition.  At the hospital follow up visit please address:  1.  Improvement of weakness, status of home health PT  2.  BP check, see discharge summary from 3/17 as well   3.  Labs / imaging needed at time of follow-up: BMP  3.  Pending labs/ test needing  follow-up: none  Follow-up Appointments: Follow-up Information   Follow up with Lenn Cal, DDS On 11/01/2013. (8:30am)    Specialty:  Dentistry   Contact information:   [address] [place] [address] [phone]       Follow up with Dorian Heckle, MD On 11/02/2013. (9am)    Specialty:  Internal Medicine   Contact information:   [address] [place] [address] [phone]       Discharge Instructions:  Future Appointments Provider Department Dept Phone   11/01/2013 8:30 AM Lenn Cal, DDS [place] [phone]   11/02/2013 9:00 AM Jeralene Huff, MD [facility] [place] [phone]   01/10/2014 1:15 PM Corky Sox, MD [facility] [place] [phone]      Consultations:  CIR  Procedures Performed:  Dg Chest 2 View  10/24/2013   CLINICAL DATA:  Extremity weakness  EXAM: CHEST  2 VIEW  COMPARISON:  NM BONE WHOLE BODY dated 12/26/2012; DG CHEST 2 VIEW dated 12/17/2012  FINDINGS: Normal cardiac silhouette. There is left lower lobe opacity representing atelectasis or infiltrate. Right lung is clear. No pneumothorax. Low lung volumes.  IMPRESSION: Right base atelectasis versus infiltrate.   Electronically Signed   By: Nicole Kindred  Leonia Reeves M.D.   On: 10/24/2013 18:33   Dg Lumbar Spine Complete  10/25/2013   CLINICAL DATA:  Bilateral leg pain, history of prostate cancer  EXAM: LUMBAR SPINE - COMPLETE 4+ VIEW  COMPARISON:  None.  FINDINGS: Five views of lumbar spine submitted. No acute fracture or subluxation. Multilevel anterior spurring is noted. Mild disc space flattening at L4-L5 level. Moderate disc space flattening at L5-S1 level. Atherosclerotic calcifications of abdominal aorta and iliac arteries. Lateral spurring noted at L3-L4 level.  IMPRESSION: No acute fracture or subluxation. Multilevel anterior spurring. Disc space flattening at L4-L5 and L5-S1 level.   Electronically Signed    By: Lahoma Crocker M.D.   On: 10/25/2013 14:56   Ct Head Wo Contrast  10/24/2013   CLINICAL DATA:  Left-sided weakness  EXAM: CT HEAD WITHOUT CONTRAST  TECHNIQUE: Contiguous axial images were obtained from the base of the skull through the vertex without intravenous contrast.  COMPARISON:  NM BONE WHOLE BODY dated 12/26/2012; CT HEAD W/O CM dated 12/07/2012  FINDINGS: There is no evidence of mass effect, midline shift, or extra-axial fluid collections. There is no evidence of a space-occupying lesion or intracranial hemorrhage. There is no evidence of a cortical-based area of acute infarction. There is an old right cerebellar infarct with encephalomalacia. There is generalized cerebral atrophy. There is periventricular white matter low attenuation likely secondary to microangiopathy.  The ventricles and sulci are appropriate for the patient's age. The basal cisterns are patent.  Visualized portions of the orbits are unremarkable. There is mild bilateral ethmoid sinus mucosal thickening. There is complete opacification of the left maxillary sinus. There is mild right maxillary sinus mucosal thickening. The sphenoid sinuses are not pneumatized. There is mild mucosal thickening in the right frontoethmoidal recess. Cerebrovascular atherosclerotic calcifications are noted.  The osseous structures are unremarkable.  IMPRESSION: 1. No acute intracranial pathology. 2. Sinus disease as described above. 3. Chronic right cerebellar infarct.   Electronically Signed   By: Kathreen Devoid   On: 10/24/2013 18:22    Admission HPI:  [name] is a 63 y.o. male w/ PMHx of HTN, COPD, prostate cancer, stroke in 11/2012 who presented for oral surgery with Dr. Enrique Sack DDS on 10/23/13 (see previous h&p for details) for multiple tooth extractions (22) with alveoplasty and pre-prosthetic surgery under general anesthesia. Patient was discharged today but returned to the ED shortly after accompanied by a friend as patient was having  significant LE weakness, mostly left-sided in nature, inhibiting his ability to ambulate. The patient walks with a cane at baseline ans is generally independent with respect to ADL's and IADL's. Mr. Stockstill has suffered from 2 previous CVA's, most recently in 2014, in which he still has some residual left-sided weakness, but not significant enough to inhibit his ability to walk with a cane. He denies any dizziness, lightheadedness, LOC, sensory losses, or UE weakness. He also denies any recent fever, chills, nausea, vomiting, difficulty urinating or passing bowel movements. He does admit to limited po intake since his procedure yesterday and appears somewhat dry on exam.  Mr. Randles still has some oral pain and swelling, but claims this is much improved from yesterday.  CT head was performed in the ED, found to have no acute intracranial abnormality, but with chronic right cerebellar changes seen on previous CT. Also shown to have increased Cr to 1.25, up from baseline around 0.80.    Hospital Course by problem list: 1. Lower extremity weakness- Patient presented same day as  last discharge (3/17) with bilateral leg weakness (unable to lift legs out of car upon arrival home).  Likely secondary to deconditioning/dehydration in the setting of recent surgery and decreased PO intake given 22 tooth extractions on 3/16.  Patient has history of strokes (most recent in 11/2012) with baseline LLE weakness for which he previously used a cane at home.  Patient remained neurologically intact throughout admission with exception of mildly decreased strength in bilateral lower extremities L>R (exam quite variable dependent on patient effort).  Very little concern for spinal cord etiology of symptoms given that patient had no saddle anesthesia, loss of incontinence to urine or stool), no back pain, no point tenderness of spine.  Furthermore, lumbar spine xray negative, showed no fracture but multilevel anterior spurring, disc  space flattening at L4-5, L5-S1 noted; MRI lumbar spine showed numerous small bone metastases though no destruction or enhancing lesion identified, multilevel lumbar spine degenerative changes.  Of note, bone scan in 12/2012 showed metastasis at T6.  Also unlikely that patient's weakness represents recurrent stroke given bilateral weakness, ongoing (though indeed worse than before), no other neurologic symptoms, CT head negative for acute intracranial abnormality.  Of note, patient was off Plavix temporarily prior to procedure.  Neurology was consulted on 3/19 and agree with above.  Other workup included CK within normal limits, lactic acid within normal limits, Mg 1.6 and Phos 2.9 (both within normal limits), UDS negative, UA dirty with many squams (culture showed multiple bacterial morphotypes, none predominant), CXR with right base atelectasis only, blood cultures x 2 NGTD.  PT/OT recommended CIR, but CIR stated no medical necessity for inpatient rehab and recommended SNF.  However, patient and his daughter prefer him to go home with home health PT.  Arranged home health PT, nursing aide, and social worker prior to discharge.   2. AKI, resolved- Likely pre-renal given recent surgery with limited PO intake. Cr 0.80 at baseline, 1.25 on admission --> 0.93 on 3/18.  Of note, Cr on morning of 3/17 discharge (<12 hours prior to re-admission labs) 0.82. CK within normal limits.    3. HTN- Patient on HCTZ 25 mg daily, lisinopril 10 mg daily at home. Held both on admission given normotensive BP and AKI.  BP elevated to 144R systolic on 1/54 thus  restarted HCTZ 25 mg daily, restarted lisinopril 10 mg daily on 3/20.   4. Recent teeth extractions- Oral surgery with Dr. Enrique Sack on 10/23/13 for multiple teeth extractions (22) with alveoplasty and pre-prosthetic surgery under general anesthesia. Swelling significantly improved, do not suspect infection at this time.  Hgb stable at 10.4 (10.7 at discharge on 3/17, 12.1 on  admission).  Patient was continued on dysphagia 1 diet, salt water rinses q2h while awake.   5. COPD- Stable, lungs CTAB, no concern for acute exacerbation at this time.  Continued Spiriva daily, Albuterol prn while inpatient and at discharge.   6. Prostate cancer- Patient following with Dr. Janice Norrie, see above. Per patient, last PSA <1. Follow-up outpatient, patient likely needs repeat bone scan in near future.  7. History of stroke (right centrum semiovale)- Stable, patient has documented allergy to ASA, usually on Plavix at home. Plavix held s/p extractions on 3/16. CT on admission with no acute findings per above.  Continued Plavix per dentistry recs while inpatient and at discharge.    8. Bed bugs- Patient found to have bed bugs on 3/16 admission.  Patient placed on contact isolation while inpatient, counseled patient prior to 3/17 discharge.  Discharge Vitals:   BP 150/90  Pulse 73  Temp(Src) 98.6 F (37 C) (Oral)  Resp 18  Ht 5\' 8"  (1.727 m)  Wt 169 lb 15.6 oz (77.1 kg)  BMI 25.85 kg/m2  SpO2 98%  Discharge Labs:  No results found for this or any previous visit (from the past 24 hour(s)).  Signed: Ivin Poot, MD 10/27/2013, 10:11 AM   Time Spent on Discharge: 35 minutes Services Ordered on Discharge: SNF Equipment Ordered on Discharge: rolling walker

## 2013-10-26 NOTE — Progress Notes (Signed)
Subjective: [name] is doing better this morning.  Eating much better now.  States that his legs still feel weaker than usual, he has not tried to ambulate since yesterday.  He is willing to go to SNF.  Again denies back pain, numbness/tingling, incontinence to urine or stool, lightheadedness.  No urinary symptoms including dysuria, frequency, urgency, hematuria.   Objective: Vital signs in last 24 hours: Filed Vitals:   10/25/13 1846 10/25/13 2245 10/26/13 0500 10/26/13 0541  BP: 129/71 151/87  158/81  Pulse: 100 75  73  Temp: 97.6 F (36.4 C) 97.9 F (36.6 C)  98 F (36.7 C)  TempSrc: Oral Axillary  Axillary  Resp: 18 18  18   Height:      Weight:   170 lb (77.111 kg)   SpO2: 96% 99%  100%   PEX General: alert, cooperative, and in no apparent distress HEENT: NCAT, vision grossly intact, mild oral swelling, no bleeding Neck: supple, no lymphadenopathy Lungs: clear to auscultation bilaterally, normal work of respiration, no wheezes, rales, ronchi Heart: regular rate and rhythm, no murmurs, gallops, or rubs Abdomen: soft, non-tender, non-distended, normal bowel sounds Extremities: 2+ DP/PT pulses bilaterally, no cyanosis, clubbing, or edema Neurologic: alert & oriented X3, cranial nerves II-XII intact, strength grossly intact, sensation intact to light touch Left leg 4/5, right leg 4+/5, sensation intact throughout  Lab Results: Basic Metabolic Panel:  Recent Labs Lab 10/24/13 2215 10/24/13 2245 10/25/13 0520  NA  --  138 140  K  --  3.5* 3.6*  CL  --  101 101  CO2  --  25 26  GLUCOSE  --  98 88  BUN  --  13 12  CREATININE  --  1.13 0.93  CALCIUM  --  9.4 9.6  MG  --  1.6  --   PHOS 2.9  --   --    Liver Function Tests:  Recent Labs Lab 10/24/13 2245  AST 27  ALT 21  ALKPHOS 55  BILITOT 0.5  PROT 6.8  ALBUMIN 2.7*    Recent Labs Lab 10/24/13 2245  LIPASE 14   CBC:  Recent Labs Lab 10/23/13 1535  10/24/13 1706 10/25/13 0520  WBC 8.3  < >  9.2 8.9  NEUTROABS 3.7  --   --   --   HGB 12.2*  < > 12.1* 10.4*  HCT 36.5*  < > 36.5* 31.5*  MCV 85.9  < > 85.9 86.3  PLT 290  < > 260 276  < > = values in this interval not displayed.  Cardiac Enzymes:  Recent Labs Lab 10/24/13 2245  CKTOTAL 162   CBG:  Recent Labs Lab 10/24/13 1647  GLUCAP 113*   Urine Drug Screen: Drugs of Abuse     Component Value Date/Time   LABOPIA NONE DETECTED 10/25/2013 0410   LABOPIA NEGATIVE 10/14/2010 2140   COCAINSCRNUR NONE DETECTED 10/25/2013 0410   COCAINSCRNUR  Value: POSITIVE (NOTE) Result repeated and verified. Sent for confirmatory testing* 10/14/2010 2140   LABBENZ NONE DETECTED 10/25/2013 0410   LABBENZ NEGATIVE 10/14/2010 2140   AMPHETMU NONE DETECTED 10/25/2013 0410   AMPHETMU NEGATIVE 10/14/2010 2140   THCU NONE DETECTED 10/25/2013 0410   LABBARB NONE DETECTED 10/25/2013 0410    Urinalysis:  Recent Labs Lab 10/25/13 0410  COLORURINE YELLOW  LABSPEC 1.018  PHURINE 6.0  GLUCOSEU NEGATIVE  HGBUR LARGE*  BILIRUBINUR NEGATIVE  KETONESUR NEGATIVE  PROTEINUR 100*  UROBILINOGEN 0.2  NITRITE NEGATIVE  LEUKOCYTESUR MODERATE*  Micro Results: Recent Results (from the past 240 hour(s))  GRAM STAIN     Status: None   Collection Time    10/25/13  4:11 AM      Result Value Ref Range Status   Specimen Description URINE, CLEAN CATCH   Final   Special Requests Normal   Final   Gram Stain     Final   Value: [id number]     WBC PRESENT,BOTH PMN AND MONONUCLEAR     Multiple bacterial morphotypes present, none predominant. Suggest appropriate recollection if clinically indicated.     YEAST     Results Called toZelphia Cairo RN [phone] Perlie Mayo   Report Status 10/25/2013 FINAL   Final   Studies/Results: Dg Chest 2 View  10/24/2013   CLINICAL DATA:  Extremity weakness  EXAM: CHEST  2 VIEW  COMPARISON:  NM BONE WHOLE BODY dated 12/26/2012; DG CHEST 2 VIEW dated 12/17/2012  FINDINGS: Normal cardiac silhouette. There is left lower lobe opacity  representing atelectasis or infiltrate. Right lung is clear. No pneumothorax. Low lung volumes.  IMPRESSION: Right base atelectasis versus infiltrate.   Electronically Signed   By: Suzy Bouchard M.D.   On: 10/24/2013 18:33   Dg Lumbar Spine Complete  10/25/2013   CLINICAL DATA:  Bilateral leg pain, history of prostate cancer  EXAM: LUMBAR SPINE - COMPLETE 4+ VIEW  COMPARISON:  None.  FINDINGS: Five views of lumbar spine submitted. No acute fracture or subluxation. Multilevel anterior spurring is noted. Mild disc space flattening at L4-L5 level. Moderate disc space flattening at L5-S1 level. Atherosclerotic calcifications of abdominal aorta and iliac arteries. Lateral spurring noted at L3-L4 level.  IMPRESSION: No acute fracture or subluxation. Multilevel anterior spurring. Disc space flattening at L4-L5 and L5-S1 level.   Electronically Signed   By: Lahoma Crocker M.D.   On: 10/25/2013 14:56   Ct Head Wo Contrast  10/24/2013   CLINICAL DATA:  Left-sided weakness  EXAM: CT HEAD WITHOUT CONTRAST  TECHNIQUE: Contiguous axial images were obtained from the base of the skull through the vertex without intravenous contrast.  COMPARISON:  NM BONE WHOLE BODY dated 12/26/2012; CT HEAD W/O CM dated 12/07/2012  FINDINGS: There is no evidence of mass effect, midline shift, or extra-axial fluid collections. There is no evidence of a space-occupying lesion or intracranial hemorrhage. There is no evidence of a cortical-based area of acute infarction. There is an old right cerebellar infarct with encephalomalacia. There is generalized cerebral atrophy. There is periventricular white matter low attenuation likely secondary to microangiopathy.  The ventricles and sulci are appropriate for the patient's age. The basal cisterns are patent.  Visualized portions of the orbits are unremarkable. There is mild bilateral ethmoid sinus mucosal thickening. There is complete opacification of the left maxillary sinus. There is mild right  maxillary sinus mucosal thickening. The sphenoid sinuses are not pneumatized. There is mild mucosal thickening in the right frontoethmoidal recess. Cerebrovascular atherosclerotic calcifications are noted.  The osseous structures are unremarkable.  IMPRESSION: 1. No acute intracranial pathology. 2. Sinus disease as described above. 3. Chronic right cerebellar infarct.   Electronically Signed   By: Kathreen Devoid   On: 10/24/2013 18:22   Medications: I have reviewed the patient's current medications. Scheduled Meds: . atorvastatin  80 mg Oral q1800  . clopidogrel  75 mg Oral Q breakfast  . multivitamin with minerals  1 tablet Oral Daily  . tiotropium  18 mcg Inhalation Daily   Continuous Infusions:   PRN Meds:.albuterol,  oxyCODONE-acetaminophen Assessment/Plan: #Lower extremity weakness- Likely 2/2 deconditioning/dehydration in the setting of recent surgery and decreased PO intake given 22 tooth extractions on 3/16.   Patient has history of strokes (most recent in 11/2012) with baseline LLE weakness for which he uses cane at home.  He is neurologically intact with exception of mildly decreased strength in bilateral lower extremities L>R (exam quite variable and seems dependent on patient effort).  Very little concern for spinal cord etiology of symptoms given that he neurologically intact (including no saddle anesthesia, loss of incontinence to urine or stool), no back pain, or point tenderness of spine.  Lumbar spine xray negative (no fracture but multilevel anterior spurring, disc space flattening at L4-5, L5-S1 noted).  After further discussion, decided to proceed with MRI lumbar spine as well for further evaluation given patient's history of metastatic prostate cancer.  Of note, bone scan in 12/2012 showed metastasis at T6 (but would not expect this to affect patient's lower extremities).   Also unlikely that patient's weakness represents recurrent stroke as his weakness is bilateral and has been  ongoing (though worse than before), no other symptoms, CT head negative for acute intracranial abnormality.  Of note, patient was off Plavix temporarily prior to procedure.    Other workup: Lactic acid within normal limits. Mg 1.6 and Phos 2.9 (both within normal limits).  UDS negative. CXR showed right base atelectasis.  UA dirty with many squams, culture pending.  -PT/OT rec CIR --> CIR states no medical necessity for inpatient rehab, rec SNF -social work consult [name], [phone]) for SNF placement, rolling walker ordered  -MRI lumbar spine pending -continue NS at 75 cc/hr for now given decreased PO intake -blood cultures x 2, urine culture pending  #AKI, resolved- Likely pre-renal given recent surgery with limited PO intake.  Cr 0.80 at baseline, 1.25 on admission --> 0.93 on 3/18.  Of note, Cr on morning of discharge (<12 hours prior to re-admission labs) 0.82.  CK within normal limits -continue holding lisinopril for now given contrast with MRI today  #HTN- BP in [id number] systolic today.  Patient on HCTZ 25 mg daily, lisinopril 10 mg daily at home.  Held both on admission given normotensive BP and AKI.  -restart HCTZ 25 mg daily -holding lisinopril per above  #Recent teeth extractions- Oral surgery with Dr. Enrique Sack on 10/23/13 for multiple teeth extractions (22) with alveoplasty and pre-prosthetic surgery under general anesthesia. Swelling significantly improved, do not suspect infection at this time.  Hgb stable at 10.4 (10.7 at discharge on 3/17, 12.1 on admission).  -dysphagia 1 diet -salt water rinses q2h while awake  #COPD- Stable, lungs CTAB, no concern for acute exacerbation at this time.  -continue Spiriva daily, Albuterol prn   #Prostate cancer- Patient following with Dr. Janice Norrie, see above. Per patient, last PSA <1. Follow-up outpatient.   #History of stroke (right centrum semiovale)- Stable, patient has documented allergy to ASA, usually on Plavix at home.  Plavix held s/p extractions  on 3/16.  CT on admission with no acute findings per above.  -continue Plavix per dentistry recs   #Bed bugs- Patient found to have bed bugs on admission 3/16.  Already counseled patient on this.  -continue contact isolation   Dispo: Disposition is deferred at this time, awaiting improvement of current medical problems.  Anticipated discharge today or tomorrow.    The patient does have a current PCP Corky Sox, MD) and does need an [facility] hospital follow-up appointment after discharge.   .Services Needed  at time of discharge: Y = Yes, Blank = No PT:   OT:   RN:   Equipment:   Other:     LOS: 2 days   Ivin Poot, MD 10/26/2013, 7:12 AM

## 2013-10-27 MED ORDER — POLYETHYLENE GLYCOL 3350 17 G PO PACK
17.0000 g | PACK | Freq: Every day | ORAL | Status: DC
  Administered 2013-10-27: 17 g via ORAL
  Filled 2013-10-27: qty 1

## 2013-10-27 MED ORDER — LISINOPRIL 10 MG PO TABS: 10.0000 mg | ORAL_TABLET | Freq: Every day | ORAL | Status: DC

## 2013-10-27 MED ORDER — LISINOPRIL 10 MG PO TABS
10.0000 mg | ORAL_TABLET | Freq: Every day | ORAL | Status: DC
  Administered 2013-10-27: 10 mg via ORAL
  Filled 2013-10-27: qty 1

## 2013-10-27 NOTE — Care Management Note (Signed)
    Page 1 of 2   10/27/2013     3:27:37 PM   CARE MANAGEMENT NOTE 10/27/2013  Patient:  CLIFF, DAMIANI   Account Number:  [id number]  Date Initiated:  10/27/2013  Documentation initiated by:  Lorne Skeens  Subjective/Objective Assessment:   Patient admitted with increased weakness due to poor PO intake and dehydration following oral surgery.  Lives at home with friend, who is there 24/7     Action/Plan:   Will follow for discharge needs   Anticipated DC Date:  10/27/2013   Anticipated DC Plan:  [place]  CM consult      Choice offered to / List presented to:  C-4 Adult Children   DME arranged  [place]      DME agency  [place] arranged  [place]      [facility]   Status of service:  Completed, signed off Medicare Important Message given?   (If response is "NO", the following Medicare IM given date fields will be blank) Date Medicare IM given:   Date Additional Medicare IM given:    Discharge Disposition:  [place]  Per UR Regulation:  Reviewed for med. necessity/level of care/duration of stay  If discussed at [place] of Stay Meetings, dates discussed:    Comments:  10/27/13 [place], MSN, CM- Met with patient regarding home health orders.  Patient asked CM to speak with "daughter" [name] [phone] regardin home health agency choice.  [facility] was chosen, but were unable to accept patient at this time.  [facility] was able to provide a total of 3 visits according to his diagnosis and Medicaid guidelines.  [facility] was notified of need for tub bench and rolling walker for discharge home today.  Patient's daughter is requesting assistance with applying for Medicaid PCS services, requiring paperwork to be completed by the PCP.  This request was shared with Dr Stann Mainland, who will  pass the information on to patient's PCP Dr Ronnald Ramp.  Patient's daughter was asked to drop paperwork off to the [facility] for completion. Referral was placed by CM to [facility] with [facility] [phone] to assist patient with any additional community resources, voicemail message was left.

## 2013-10-27 NOTE — Progress Notes (Signed)
Patient discharge education and paperwork completed.  IV removed and patient wheeled downstairs for discharge.  [name]

## 2013-10-27 NOTE — Discharge Instructions (Signed)
We have arranged home health physical therapy as well as a nursing aide and social worker to help you at home.  Remember to do salt water mouth rinses every 2 hours while you are awake.   You have follow-up appointments with Dr. Enrique Sack and [facility] next week.    Dehydration, Adult Dehydration means your body does not have as much fluid as it needs. Your kidneys, brain, and heart will not work properly without the right amount of fluids and salt.  HOME CARE  Ask your doctor how to replace body fluid losses (rehydrate).  Drink enough fluids to keep your pee (urine) clear or pale yellow.  Drink small amounts of fluids often if you feel sick to your stomach (nauseous) or throw up (vomit).  Eat like you normally do.  Avoid:  Foods or drinks high in sugar.  Bubbly (carbonated) drinks.  Juice.  Very hot or cold fluids.  Drinks with caffeine.  Fatty, greasy foods.  Alcohol.  Tobacco.  Eating too much.  Gelatin desserts.  Wash your hands to avoid spreading germs (bacteria, viruses).  Only take medicine as told by your doctor.  Keep all doctor visits as told. GET HELP RIGHT AWAY IF:   You cannot drink something without throwing up.  You get worse even with treatment.  Your vomit has blood in it or looks greenish.  Your poop (stool) has blood in it or looks black and tarry.  You have not peed in 6 to 8 hours.  You pee a small amount of very dark pee.  You have a fever.  You pass out (faint).  You have belly (abdominal) pain that gets worse or stays in one spot (localizes).  You have a rash, stiff neck, or bad headache.  You get easily annoyed, sleepy, or are hard to wake up.  You feel weak, dizzy, or very thirsty. MAKE SURE YOU:   Understand these instructions.  Will watch your condition.  Will get help right away if you are not doing well or get worse. Document Released: 05/23/2009 Document Revised: 10/19/2011 Document Reviewed: 03/16/2011 [facility]  Patient Information 2014 [place], [place].

## 2013-10-27 NOTE — Progress Notes (Signed)
Physical Therapy Treatment Patient Details Name: [name] MRN: [id number] DOB: 12-20-1950 Today's Date: 10/27/2013 Time: 7673-4193 PT Time Calculation (min): 24 min  PT Assessment / Plan / Recommendation  History of Present Illness [name] is a 63 y.o. male w/ PMHx of HTN, COPD, prostate cancer, stroke in 11/2012 who presented for oral surgery with Dr. Enrique Sack on 10/23/13, readmitted for weakness and AKI.   PT Comments   Patient agreeable to out of bed and wanting to work with therapy. Patient able to increase ambulation and concentrate on positioning of LLE. Patient with some safety concern and required MOD A to prevent fall when attempting to pick up RW and turn. Continue to recommend comprehensive inpatient rehab (CIR) for post-acute therapy needs.   Follow Up Recommendations  CIR;Supervision/Assistance - 24 hour     Does the patient have the potential to tolerate intense rehabilitation     Barriers to Discharge        Equipment Recommendations  Rolling walker with 5" wheels    Recommendations for Other Services    Frequency Min 3X/week   Progress towards PT Goals Progress towards PT goals: Progressing toward goals  Plan Current plan remains appropriate    Precautions / Restrictions Precautions Precautions: Fall Restrictions Weight Bearing Restrictions: No   Pertinent Vitals/Pain Denied pain   Mobility  Bed Mobility Overal bed mobility: Needs Assistance Supine to sit: Supervision Transfers Overall transfer level: Needs assistance Equipment used: Rolling walker (2 wheeled);1 person hand held assist Sit to Stand: Min assist;Min guard General transfer comment: Cues for hand placement. Ambulation/Gait Ambulation/Gait assistance: Min assist Ambulation Distance (Feet): 150 Feet Assistive device: Rolling walker (2 wheeled) Gait velocity: decreased General Gait Details: LLE drag, decreased clearance, requires cues to attend to left leg during ambulation,  VCs for stride of LLE, heavy reliance on RW. Modified Rankin (Stroke Patients Only) Pre-Morbid Rankin Score: Slight disability Modified Rankin: Moderately severe disability    Exercises     PT Diagnosis:    PT Problem List:   PT Treatment Interventions:     PT Goals (current goals can now be found in the care plan section)    Visit Information  Last PT Received On: 10/27/13 Assistance Needed: +1 History of Present Illness: [name] is a 63 y.o. male w/ PMHx of HTN, COPD, prostate cancer, stroke in 11/2012 who presented for oral surgery with Dr. Enrique Sack on 10/23/13, readmitted for weakness and AKI.    Subjective Data      Cognition  Cognition Arousal/Alertness: Awake/alert Behavior During Therapy: WFL for tasks assessed/performed Overall Cognitive Status: Impaired/Different from baseline Area of Impairment: Safety/judgement;Awareness Orientation Level: Disoriented to;Time Memory: Decreased short-term memory Following Commands: Follows one step commands consistently;Follows multi-step commands inconsistently Safety/Judgement: Decreased awareness of deficits;Decreased awareness of safety    Balance  Balance Overall balance assessment: Needs assistance Sitting balance-Leahy Scale: Good Standing balance-Leahy Scale: Poor  End of Session PT - End of Session Equipment Utilized During Treatment: Gait belt Activity Tolerance: Patient limited by fatigue Patient left: in chair;with call bell/phone within reach;with chair alarm set Nurse Communication: Mobility status   GP     Jacqualyn Posey 10/27/2013, 8:44 AM  10/27/2013 Jacqualyn Posey PTA [phone] pager [phone] office

## 2013-10-27 NOTE — Discharge Summary (Signed)
INTERNAL MEDICINE ATTENDING DISCHARGE COSIGN   I evaluated the patient on the day of discharge and discussed the discharge plan with my resident team. I agree with the discharge documentation and disposition.   Madilyn Fireman 10/27/2013, 1:59 PM

## 2013-10-27 NOTE — Progress Notes (Signed)
Subjective: [name] is feeling much better this morning, sitting up in chair, ate his breakfast.  He is willing to go to SNF today.  Objective: Vital signs in last 24 hours: Filed Vitals:   10/26/13 1819 10/26/13 2100 10/27/13 0146 10/27/13 0534  BP: 158/69 156/76 169/88 169/87  Pulse: 77 77 74 74  Temp: 99 F (37.2 C) 98.3 F (36.8 C) 98.3 F (36.8 C) 98.6 F (37 C)  TempSrc: Oral Axillary Oral Oral  Resp: 18 18 18 18   Height:      Weight:    169 lb 15.6 oz (77.1 kg)  SpO2: 98% 98% 98% 96%   PEX General: alert, cooperative, and in no apparent distress HEENT: NCAT, vision grossly intact, mild oral swelling (improving), no bleeding Neck: supple, no lymphadenopathy Lungs: clear to auscultation bilaterally, normal work of respiration, no wheezes, rales, ronchi Heart: regular rate and rhythm, no murmurs, gallops, or rubs Abdomen: soft, non-tender, non-distended, normal bowel sounds Extremities: 2+ DP/PT pulses bilaterally, no cyanosis, clubbing, or edema Neurologic: alert & oriented X3, cranial nerves II-XII intact, strength grossly intact, sensation intact to light touch Left leg 3+/5, right leg 4/5, sensation intact throughout  Lab Results: Basic Metabolic Panel:  Recent Labs Lab 10/24/13 2215 10/24/13 2245 10/25/13 0520  NA  --  138 140  K  --  3.5* 3.6*  CL  --  101 101  CO2  --  25 26  GLUCOSE  --  98 88  BUN  --  13 12  CREATININE  --  1.13 0.93  CALCIUM  --  9.4 9.6  MG  --  1.6  --   PHOS 2.9  --   --    Liver Function Tests:  Recent Labs Lab 10/24/13 2245  AST 27  ALT 21  ALKPHOS 55  BILITOT 0.5  PROT 6.8  ALBUMIN 2.7*    Recent Labs Lab 10/24/13 2245  LIPASE 14   CBC:  Recent Labs Lab 10/23/13 1535  10/24/13 1706 10/25/13 0520  WBC 8.3  < > 9.2 8.9  NEUTROABS 3.7  --   --   --   HGB 12.2*  < > 12.1* 10.4*  HCT 36.5*  < > 36.5* 31.5*  MCV 85.9  < > 85.9 86.3  PLT 290  < > 260 276  < > = values in this interval not  displayed.  Cardiac Enzymes:  Recent Labs Lab 10/24/13 2245  CKTOTAL 162   CBG:  Recent Labs Lab 10/24/13 1647  GLUCAP 113*   Urine Drug Screen: Drugs of Abuse     Component Value Date/Time   LABOPIA NONE DETECTED 10/25/2013 0410   LABOPIA NEGATIVE 10/14/2010 2140   COCAINSCRNUR NONE DETECTED 10/25/2013 0410   COCAINSCRNUR  Value: POSITIVE (NOTE) Result repeated and verified. Sent for confirmatory testing* 10/14/2010 2140   LABBENZ NONE DETECTED 10/25/2013 0410   LABBENZ NEGATIVE 10/14/2010 2140   AMPHETMU NONE DETECTED 10/25/2013 0410   AMPHETMU NEGATIVE 10/14/2010 2140   THCU NONE DETECTED 10/25/2013 0410   LABBARB NONE DETECTED 10/25/2013 0410    Urinalysis:  Recent Labs Lab 10/25/13 0410  COLORURINE YELLOW  LABSPEC 1.018  PHURINE 6.0  GLUCOSEU NEGATIVE  HGBUR LARGE*  BILIRUBINUR NEGATIVE  KETONESUR NEGATIVE  PROTEINUR 100*  UROBILINOGEN 0.2  NITRITE NEGATIVE  LEUKOCYTESUR MODERATE*    Micro Results: Recent Results (from the past 240 hour(s))  CULTURE, BLOOD (ROUTINE X 2)     Status: None   Collection Time  10/24/13  6:45 PM      Result Value Ref Range Status   Specimen Description BLOOD LEFT ARM   Final   Special Requests BOTTLES DRAWN AEROBIC AND ANAEROBIC 5CC   Final   Culture  Setup Time     Final   Value: 10/25/2013 01:02     Performed at [facility]   Culture     Final   Value:        BLOOD CULTURE RECEIVED NO GROWTH TO DATE CULTURE WILL BE HELD FOR 5 DAYS BEFORE ISSUING A FINAL NEGATIVE REPORT     Performed at [facility]   Report Status PENDING   Incomplete  CULTURE, BLOOD (ROUTINE X 2)     Status: None   Collection Time    10/24/13  7:17 PM      Result Value Ref Range Status   Specimen Description BLOOD RIGHT FOREARM   Final   Special Requests BOTTLES DRAWN AEROBIC AND ANAEROBIC 5CC   Final   Culture  Setup Time     Final   Value: 10/25/2013 01:04     Performed at [facility]   Culture     Final   Value:        BLOOD  CULTURE RECEIVED NO GROWTH TO DATE CULTURE WILL BE HELD FOR 5 DAYS BEFORE ISSUING A FINAL NEGATIVE REPORT     Performed at [facility]   Report Status PENDING   Incomplete  URINE CULTURE     Status: None   Collection Time    10/25/13  4:10 AM      Result Value Ref Range Status   Specimen Description URINE, CLEAN CATCH   Final   Special Requests Normal   Final   Culture  Setup Time     Final   Value: 10/25/2013 08:05     Performed at [place]     Final   Value: >=100,000 COLONIES/ML     Performed at [facility]   Culture     Final   Value: Multiple bacterial morphotypes present, none predominant. Suggest appropriate recollection if clinically indicated.     Performed at [facility]   Report Status 10/26/2013 FINAL   Final  GRAM STAIN     Status: None   Collection Time    10/25/13  4:11 AM      Result Value Ref Range Status   Specimen Description URINE, CLEAN CATCH   Final   Special Requests Normal   Final   Gram Stain     Final   Value: CYTOSPUN     WBC PRESENT,BOTH PMN AND MONONUCLEAR     Multiple bacterial morphotypes present, none predominant. Suggest appropriate recollection if clinically indicated.     YEAST     Results Called toZelphia Cairo RN [id number] Perlie Mayo   Report Status 10/25/2013 FINAL   Final   Studies/Results: Dg Lumbar Spine Complete  10/25/2013   CLINICAL DATA:  Bilateral leg pain, history of prostate cancer  EXAM: LUMBAR SPINE - COMPLETE 4+ VIEW  COMPARISON:  None.  FINDINGS: Five views of lumbar spine submitted. No acute fracture or subluxation. Multilevel anterior spurring is noted. Mild disc space flattening at L4-L5 level. Moderate disc space flattening at L5-S1 level. Atherosclerotic calcifications of abdominal aorta and iliac arteries. Lateral spurring noted at L3-L4 level.  IMPRESSION: No acute fracture or subluxation. Multilevel anterior spurring. Disc space flattening at L4-L5 and L5-S1  level.    Electronically Signed   By: Lahoma Crocker M.D.   On: 10/25/2013 14:56   Mr Lumbar Spine W Wo Contrast  10/26/2013   CLINICAL DATA:  63 year old male with severe low back pain and difficulty walking. Initial encounter. History of metastatic prostate cancer.  EXAM: MRI LUMBAR SPINE WITHOUT AND WITH CONTRAST  TECHNIQUE: Multiplanar and multiecho pulse sequences of the lumbar spine were obtained without and with intravenous contrast.  CONTRAST:  47mL MULTIHANCE GADOBENATE DIMEGLUMINE 529 MG/ML IV SOLN  COMPARISON:  Lumbar radiographs 10/25/2013. Nuclear medicine whole-body bone scan 12/1912.  FINDINGS: T1 bone marrow signal is heterogeneous throughout the visible spine and pelvis. The appearance is highly suspicious for numerous small osseous metastases (e.g. Series 6, image 15 in the L2 vertebral body), however, no enhancing or overtly destructive osseous lesion is identified.  Preserved lumbar vertebral height and alignment.  Visualized lower thoracic spinal cord is normal with conus medularis at L1-L2. No abnormal intradural enhancement.  Negative visualized abdominal viscera (simple appearing right renal midpole cyst).  The primary degenerative finding in the lumbar spine is disc bulging and epidural lipomatosis. Details:  T10-T11:  Mild disc desiccation.  T11-T12:  Negative.  T12-L1:  Negative.  L1-L2:  Mild disc bulge.  L2-L3: Left eccentric circumferential disc osteophyte complex. Mild facet hypertrophy. Mild to moderate epidural lipomatosis. Overall mild spinal stenosis (series 8, image 15). Mild to moderate left L2 foraminal stenosis.  L3-L4: Left eccentric circumferential disc bulge. Mild facet hypertrophy. No significant spinal stenosis. Mild to moderate bilateral L3 foraminal stenosis, greater on the right.  L4-L5: Circumferential disc osteophyte complex. Mild to moderate facet hypertrophy. Mild epidural lipomatosis. No spinal stenosis. Mild to moderate bilateral L4 foraminal stenosis, greater on the right.   L5-S1: Circumferential disc osteophyte complex. Mild facet hypertrophy. Epidural lipomatosis. No spinal stenosis. Mild left and moderate right L5 foraminal stenosis.  IMPRESSION: 1. Heterogeneous bone marrow signal throughout the visible spine and pelvis is highly suspicious for numerous small bone metastases in this setting, even though no destructive or enhancing lesion is identified. A repeat nuclear medicine whole-body bone scan to compare to that on 12/26/2012 would be valuable. 2. Multilevel lumbar spine degenerative changes. Multifactorial mild spinal stenosis at L2-L3. Multilevel multifactorial moderate lumbar neural foraminal stenosis.   Electronically Signed   By: Lars Pinks M.D.   On: 10/26/2013 11:33   Medications: I have reviewed the patient's current medications. Scheduled Meds: . atorvastatin  80 mg Oral q1800  . clopidogrel  75 mg Oral Q breakfast  . hydrochlorothiazide  25 mg Oral Daily  . multivitamin with minerals  1 tablet Oral Daily  . polyethylene glycol  17 g Oral Daily  . tiotropium  18 mcg Inhalation Daily     PRN Meds:.albuterol, guaiFENesin, oxyCODONE-acetaminophen Assessment/Plan: #Lower extremity weakness- Likely 2/2 deconditioning/dehydration in the setting of recent surgery and decreased PO intake given 22 tooth extractions on 3/16.   Patient has history of strokes (most recent in 11/2012) with baseline LLE weakness for which he uses cane at home.  He is neurologically intact with exception of mildly decreased strength in bilateral lower extremities L>R (exam quite variable and seems dependent on patient effort).  Very little concern for spinal cord etiology of symptoms given that he neurologically intact (including no saddle anesthesia, loss of incontinence to urine or stool), no back pain, or point tenderness of spine.  Lumbar spine xray negative (no fracture but multilevel anterior spurring, disc space flattening at L4-5, L5-S1 noted).  After further discussion, decided  to proceed with MRI lumbar spine as well for further evaluation given patient's history of metastatic prostate cancer.  MRI lumbar spine showed numerous small bone metastases though no destruction or enhancing lesion identified, multilevel lumbar spine degenerative changes.  Of note, bone scan in 12/2012 showed metastasis at T6 (but would not expect this to affect patient's lower extremities).   Also unlikely that patient's weakness represents recurrent stroke as his weakness is bilateral and has been ongoing (though worse than before), no other symptoms, CT head negative for acute intracranial abnormality.  Of note, patient was off Plavix temporarily prior to procedure.   Other workup: Lactic acid within normal limits.  CK 162 (within normal limits). Mg 1.6 and Phos 2.9 (both within normal limits).  UDS negative.  CXR showed right base atelectasis.  UA dirty with many squams, culture with multiple bacterial morphotypes present, none predominant.  Blood cultures x 2 NGTD. -neurology consult, appreciate recs; agree with above  -PT/OT rec CIR --> CIR states no medical necessity for inpatient rehab, rec SNF -social work consult Barnett Applebaum, [phone]) for SNF placement, rolling walker ordered  -continue NS at 75 cc/hr through discharge  #AKI, resolved- Likely pre-renal given recent surgery with limited PO intake.  Cr 0.80 at baseline, 1.25 on admission --> 0.93 on 3/18.  Of note, Cr on morning of discharge (<12 hours prior to re-admission labs) 0.82.  CK within normal limits.   #HTN- BP elevated today.  Patient on HCTZ 25 mg daily, lisinopril 10 mg daily at home.  Held both on admission given normotensive BP and AKI.  -continue HCTZ 25 mg daily, restart lisinopril 10 mg daily  #Recent teeth extractions- Oral surgery with Dr. Enrique Sack on 10/23/13 for multiple teeth extractions (22) with alveoplasty and pre-prosthetic surgery under general anesthesia. Swelling significantly improved, do not suspect infection at this  time.  Hgb stable at 10.4 (10.7 at discharge on 3/17, 12.1 on admission).  -dysphagia 1 diet -salt water rinses q2h while awake  #COPD- Stable, lungs CTAB, no concern for acute exacerbation at this time.  -continue Spiriva daily, Albuterol prn   #Prostate cancer- Patient following with Dr. Janice Norrie, see above. Per patient, last PSA <1. Follow-up outpatient.   #History of stroke (right centrum semiovale)- Stable, patient has documented allergy to ASA, usually on Plavix at home.  Plavix held s/p extractions on 3/16.  CT on admission with no acute findings per above.  -continue Plavix per dentistry recs   #Bed bugs- Patient found to have bed bugs on admission 3/16.  Already counseled patient on this.  -continue contact isolation   Dispo: Disposition is deferred at this time, awaiting improvement of current medical problems.  Anticipated discharge today or tomorrow.    The patient does have a current PCP Corky Sox, MD) and does need an [facility] hospital follow-up appointment after discharge.   .Services Needed at time of discharge: Y = Yes, Blank = No PT:   OT:   RN:   Equipment:   Other:     LOS: 3 days   Ivin Poot, MD 10/27/2013, 8:50 AM

## 2013-10-27 NOTE — Social Work (Signed)
Met with patient's daughter, [name], who has indicated she and her dad want to pursue home with [facility] and not SNF at this time- daughter feels like he can be taken care of at home with HH/DME and her assistance. RNCM updated and to f/u with daughter and MD for d/c planning to home.  CSW will sign off at this time.  Eduard Clos, MSW, [place]

## 2013-10-27 NOTE — Progress Notes (Signed)
Occupational Therapy Treatment Patient Details Name: [name] MRN: [id number] DOB: 25-Feb-1951 Today's Date: 10/27/2013 Time: 0102-7253 OT Time Calculation (min): 17 min  OT Assessment / Plan / Recommendation  History of present illness [name] is a 63 y.o. male w/ PMHx of HTN, COPD, prostate cancer, stroke in 11/2012 who presented for oral surgery with Dr. Enrique Sack on 10/23/13, readmitted for weakness and AKI.   OT comments  Pt improved from yesterday, but still requires min A overall for BADLs due to impaired balance.  He has had several falls, per his report, getting into/out of shower.  Recommend tub transfer bench for home.   Follow Up Recommendations  Home health OT;Supervision/Assistance - 24 hour    Barriers to Discharge       Equipment Recommendations  Tub/shower bench;3 in 1 bedside comode    Recommendations for Other Services    Frequency Min 2X/week   Progress towards OT Goals Progress towards OT goals: Progressing toward goals  Plan Discharge plan needs to be updated    Precautions / Restrictions Precautions Precautions: Fall   Pertinent Vitals/Pain     ADL  Lower Body Dressing: Minimal assistance Where Assessed - Lower Body Dressing: Supported sit to stand Toilet Transfer: Minimal assistance Toilet Transfer Method: Sit to Loss adjuster, chartered: Comfort height toilet;Grab bars Toileting - Clothing Manipulation and Hygiene: Minimal assistance Where Assessed - Best boy and Hygiene: Sit to stand from 3-in-1 or toilet Tub/Shower Transfer: Minimal assistance Tub/Shower Transfer Method: Stand pivot Tub/Shower Transfer Equipment: IT consultant Used: Rolling walker Transfers/Ambulation Related to ADLs: min A  for ambulation.  ADL Comments: Pt able to don/doff socks today. applied lotion to bil. legs and feet.  Instructed pt on use of tub transfer bench and he was able to return demonstration with min A.    Pt requires min A for balance, and verbal cues for hand placement.  He reports his partner will be with him at home and will provide 24 hour assistance.     OT Diagnosis:    OT Problem List:   OT Treatment Interventions:     OT Goals(current goals can now be found in the care plan section) Acute Rehab OT Goals Patient Stated Goal: get his legs back to working OT Goal Formulation: With patient Time For Goal Achievement: 11/01/13 Potential to Achieve Goals: Good ADL Goals Pt Will Perform Grooming: with modified independence;standing Pt Will Perform Lower Body Dressing: with modified independence;sit to/from stand Pt Will Transfer to Toilet: with modified independence;ambulating Pt Will Perform Toileting - Clothing Manipulation and hygiene: with modified independence;sit to/from stand  Visit Information  Last OT Received On: 10/27/13 Assistance Needed: +1 History of Present Illness: [name] is a 63 y.o. male w/ PMHx of HTN, COPD, prostate cancer, stroke in 11/2012 who presented for oral surgery with Dr. Enrique Sack on 10/23/13, readmitted for weakness and AKI.    Subjective Data      Prior Functioning       Cognition  Cognition Arousal/Alertness: Awake/alert Behavior During Therapy: WFL for tasks assessed/performed Overall Cognitive Status: Impaired/Different from baseline Area of Impairment: Safety/judgement;Awareness;Problem solving Orientation Level: Disoriented to;Time Memory: Decreased short-term memory Following Commands: Follows one step commands consistently;Follows multi-step commands inconsistently Safety/Judgement: Decreased awareness of safety Problem Solving: Slow processing;Difficulty sequencing;Requires verbal cues;Requires tactile cues    Mobility  Bed Mobility Overal bed mobility: Needs Assistance Bed Mobility: Supine to Sit Supine to sit: Supervision Transfers Overall transfer level: Needs assistance  Equipment used: Conservation officer, nature (2  wheeled) Transfers: Sit to/from [employer] to Stand: Min assist;Min guard Stand pivot transfers: Min assist General transfer comment: cues for hand placement.  Required min A at times due to balance, and min guard assist at other times.     Exercises      Balance Balance Overall balance assessment: Needs assistance Sitting-balance support: Feet supported Sitting balance-Leahy Scale: Good Standing balance support: No upper extremity supported Standing balance-Leahy Scale: Poor  End of Session OT - End of Session Equipment Utilized During Treatment: Rolling walker Activity Tolerance: Patient tolerated treatment well Patient left: in chair;with call bell/phone within reach;with chair alarm set Nurse Communication: Mobility status  [name], [name] 10/27/2013, 12:25 PM

## 2013-10-27 NOTE — Progress Notes (Signed)
Pt. Daughter requesting to see SW regarding SNF options.  This RN left a phone message for Marcie Bal with SW to please see patient daughter.  Kizzie Bane, RN

## 2013-10-31 ENCOUNTER — Encounter (HOSPITAL_COMMUNITY): Payer: Self-pay | Admitting: Emergency Medicine

## 2013-10-31 ENCOUNTER — Inpatient Hospital Stay (HOSPITAL_COMMUNITY)
Admission: EM | Admit: 2013-10-31 | Discharge: 2013-11-03 | DRG: 065 | Disposition: A | Discharge status: Home Health | Payer: Medicaid Other | Attending: Internal Medicine | Admitting: Internal Medicine

## 2013-10-31 ENCOUNTER — Emergency Department (HOSPITAL_COMMUNITY): Payer: Medicaid Other

## 2013-10-31 DIAGNOSIS — R972 Elevated prostate specific antigen [PSA]: Secondary | ICD-10-CM

## 2013-10-31 DIAGNOSIS — I639 Cerebral infarction, unspecified: Secondary | ICD-10-CM | POA: Diagnosis present

## 2013-10-31 DIAGNOSIS — R531 Weakness: Secondary | ICD-10-CM

## 2013-10-31 DIAGNOSIS — Z886 Allergy status to analgesic agent status: Secondary | ICD-10-CM

## 2013-10-31 DIAGNOSIS — Z7902 Long term (current) use of antithrombotics/antiplatelets: Secondary | ICD-10-CM

## 2013-10-31 DIAGNOSIS — C61 Malignant neoplasm of prostate: Secondary | ICD-10-CM | POA: Diagnosis present

## 2013-10-31 DIAGNOSIS — I739 Peripheral vascular disease, unspecified: Secondary | ICD-10-CM | POA: Diagnosis present

## 2013-10-31 DIAGNOSIS — I6340 Cerebral infarction due to embolism of unspecified cerebral artery: Principal | ICD-10-CM | POA: Diagnosis present

## 2013-10-31 DIAGNOSIS — Z8673 Personal history of transient ischemic attack (TIA), and cerebral infarction without residual deficits: Secondary | ICD-10-CM

## 2013-10-31 DIAGNOSIS — R569 Unspecified convulsions: Secondary | ICD-10-CM | POA: Diagnosis present

## 2013-10-31 DIAGNOSIS — F1010 Alcohol abuse, uncomplicated: Secondary | ICD-10-CM | POA: Diagnosis present

## 2013-10-31 DIAGNOSIS — E876 Hypokalemia: Secondary | ICD-10-CM | POA: Diagnosis not present

## 2013-10-31 DIAGNOSIS — Z79899 Other long term (current) drug therapy: Secondary | ICD-10-CM

## 2013-10-31 DIAGNOSIS — I10 Essential (primary) hypertension: Secondary | ICD-10-CM | POA: Diagnosis present

## 2013-10-31 DIAGNOSIS — J449 Chronic obstructive pulmonary disease, unspecified: Secondary | ICD-10-CM | POA: Diagnosis present

## 2013-10-31 DIAGNOSIS — Z720 Tobacco use: Secondary | ICD-10-CM | POA: Diagnosis present

## 2013-10-31 DIAGNOSIS — E785 Hyperlipidemia, unspecified: Secondary | ICD-10-CM | POA: Diagnosis present

## 2013-10-31 DIAGNOSIS — R29898 Other symptoms and signs involving the musculoskeletal system: Secondary | ICD-10-CM | POA: Diagnosis present

## 2013-10-31 DIAGNOSIS — Z2989 Encounter for other specified prophylactic measures: Secondary | ICD-10-CM

## 2013-10-31 DIAGNOSIS — N179 Acute kidney failure, unspecified: Secondary | ICD-10-CM | POA: Diagnosis present

## 2013-10-31 DIAGNOSIS — Z418 Encounter for other procedures for purposes other than remedying health state: Secondary | ICD-10-CM

## 2013-10-31 DIAGNOSIS — J4489 Other specified chronic obstructive pulmonary disease: Secondary | ICD-10-CM | POA: Diagnosis present

## 2013-10-31 DIAGNOSIS — S98139A Complete traumatic amputation of one unspecified lesser toe, initial encounter: Secondary | ICD-10-CM

## 2013-10-31 DIAGNOSIS — Z8546 Personal history of malignant neoplasm of prostate: Secondary | ICD-10-CM

## 2013-10-31 DIAGNOSIS — F17200 Nicotine dependence, unspecified, uncomplicated: Secondary | ICD-10-CM | POA: Diagnosis present

## 2013-10-31 LAB — I-STAT CHEM 8, ED
BUN: 28 mg/dL — ABNORMAL HIGH (ref 6–23)
CHLORIDE: 105 meq/L (ref 96–112)
CREATININE: 1.3 mg/dL (ref 0.50–1.35)
Calcium, Ion: 1.38 mmol/L — ABNORMAL HIGH (ref 1.13–1.30)
GLUCOSE: 83 mg/dL (ref 70–99)
HCT: 33 % — ABNORMAL LOW (ref 39.0–52.0)
Hemoglobin: 11.2 g/dL — ABNORMAL LOW (ref 13.0–17.0)
POTASSIUM: 3.9 meq/L (ref 3.7–5.3)
Sodium: 143 mEq/L (ref 137–147)
TCO2: 26 mmol/L (ref 0–100)

## 2013-10-31 LAB — CULTURE, BLOOD (ROUTINE X 2)
CULTURE: NO GROWTH
Culture: NO GROWTH

## 2013-10-31 NOTE — ED Notes (Signed)
Pt taken to CT.

## 2013-10-31 NOTE — ED Notes (Signed)
Per EMS, pt exhibiting stroke symptoms = left sided weakness, unknown onset, facial droop but may be due to having teeth pulled week ago.  Colonoscopy 2 weeks ago.  Hx three previous strokes and HTN.  VS 146/82, HR 86, RR 20, SpO2 95 on room air.  18g R AC.    Pt states left sided weakness started last Tuesday (a week ago).  Denies tingling/numbness on affected side.  Denies having any pain.  Denies respiratory problems.  Denies dysphagia.  Denies headaches. Pt alert and oriented x 4.  States dr. Ronnald Ramp in internal medicine is his personal doctor.  No acute distress.  Respirations equal and unlabored.  Skin warm and dry.

## 2013-10-31 NOTE — ED Notes (Signed)
Pt remains alert and oriented x's 3 

## 2013-10-31 NOTE — Consult Note (Signed)
Reason for Consult: Uncontrolled twitching of right lower extremity along loss of ability to speak.  HPI:                                                                                                                                          [name] is an 63 y.o. male history of hypertension, COPD, stroke and prostate cancer who was hospitalized within the past week for lower extremity weakness of unclear etiology. No specific cause was transient lower extremity weakness was found. Patient was subsequently discharged to home. He returned today with a complaint of spontaneous involuntary twitching of his right lower extremity as well as inability to speak. Patient may well be wanted to say and can understand others around him at the time. He had no symptoms involving his upper extremity. He stated that the speech changes were similar to changes he had at the onset of previous stroke. CT scan of his head showed no acute intracranial abnormality. He's been taking Plavix for antiplatelet therapy. Twitching has resolved and speech has returned to baseline.  Past Medical History  Diagnosis Date  . Hypertension 05/18/2007  . COPD 05/18/2007  . Elevated PSA 05/25/2007  . 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 Stroke 12/05/2012  . Prostate cancer     Past Surgical History  Procedure Laterality Date  . 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 Multiple extractions with alveoloplasty N/A 10/23/2013    Procedure: Extraction of tooth #'s 1,2,3,4,5,6,7,11,12,13,14,15,16,20,21,22,23,24,27,28,29,32 with alveoloplasty and bilateral maxillary tuberosity reductions;  Surgeon: Lenn Cal, DDS;  Location: [facility];  Service: Oral Surgery;  Laterality: N/A;  . Leg surgery      pin  right leg    Family History  Problem Relation Age of Onset  . Liver disease Mother   . Heart attack Mother 44  . Colon cancer Neg Hx     Social History:  reports that he has been smoking Cigarettes.  He has a  25 pack-year smoking history. He has never used smokeless tobacco. He reports that he drinks about 7.0 ounces of alcohol per week. He reports that he does not use illicit drugs.  Allergies  Allergen Reactions  . Aspirin Hives  . Penicillins Hives    MEDICATIONS:  I have reviewed the patient's current medications.   ROS:                                                                                                                                       History obtained from chart review and the patient  General ROS: negative for - chills, fatigue, fever, night sweats, weight gain or weight loss Psychological ROS: negative for - behavioral disorder, hallucinations, memory difficulties, mood swings or suicidal ideation Ophthalmic ROS: negative for - blurry vision, double vision, eye pain or loss of vision ENT ROS: negative for - epistaxis, nasal discharge, oral lesions, sore throat, tinnitus or vertigo Allergy and Immunology ROS: negative for - hives or itchy/watery eyes Hematological and Lymphatic ROS: negative for - bleeding problems, bruising or swollen lymph nodes Endocrine ROS: negative for - galactorrhea, hair pattern changes, polydipsia/polyuria or temperature intolerance Respiratory ROS: negative for - cough, hemoptysis, shortness of breath or wheezing Cardiovascular ROS: negative for - chest pain, dyspnea on exertion, edema or irregular heartbeat Gastrointestinal ROS: negative for - abdominal pain, diarrhea, hematemesis, nausea/vomiting or stool incontinence Genito-Urinary ROS: negative for - dysuria, hematuria, incontinence or urinary frequency/urgency Musculoskeletal ROS: negative for - joint swelling or muscular weakness Neurological ROS: as noted in HPI Dermatological ROS: negative for rash and skin lesion changes   Blood pressure 150/69, pulse 81, temperature  98.4 F (36.9 C), temperature source Oral, resp. rate 20, height 5\' 9"  (1.753 m), weight 83.462 kg (184 lb), SpO2 100.00%.   Neurologic Examination:                                                                                                      Mental Status: Alert, oriented, thought content appropriate.  Speech moderately dysarthric (edentulous) without evidence of aphasia. Able to follow commands without difficulty. Cranial Nerves: II-Visual fields were normal. III/IV/VI-Pupils were equal and reacted. Extraocular movements were full and conjugate.    V/VII-no facial numbness and no facial weakness. VIII-normal. X-speech was moderately dysarthric but consistent with edentulous state; symmetrical palatal movement. XII-midline tongue extension Motor: Minimal hip flexor weakness, left greater than right, and unchanged from 10/26/2013; normal muscle tone throughout. Sensory: Normal throughout. Deep Tendon Reflexes: 1+ and symmetric. Plantars: Flexor bilaterally Cerebellar: Normal finger-to-nose testing.  Lab Results  Component Value Date/Time   CHOL 146 12/06/2012  8:20 AM    Results for orders placed during the hospital encounter of 10/31/13 (from the past 48 hour(s))  I-STAT CHEM 8, ED  Status: Abnormal   Collection Time    10/31/13  7:52 PM      Result Value Ref Range   Sodium 143  137 - 147 mEq/L   Potassium 3.9  3.7 - 5.3 mEq/L   Chloride 105  96 - 112 mEq/L   BUN 28 (*) 6 - 23 mg/dL   Creatinine, Ser 1.30  0.50 - 1.35 mg/dL   Glucose, Bld 83  70 - 99 mg/dL   Calcium, Ion 1.38 (*) 1.13 - 1.30 mmol/L   TCO2 26  0 - 100 mmol/L   Hemoglobin 11.2 (*) 13.0 - 17.0 g/dL   HCT 33.0 (*) 39.0 - 52.0 %    Ct Head Wo Contrast  10/31/2013   CLINICAL DATA:  Left-sided weakness  EXAM: CT HEAD WITHOUT CONTRAST  TECHNIQUE: Contiguous axial images were obtained from the base of the skull through the vertex without intravenous contrast.  COMPARISON:  Prior CT from 10/24/2013   FINDINGS: Encephalomalacia within the right cerebellar hemisphere again seen, Stable relative to prior exam. Mild atrophy with moderate chronic microvascular ischemic changes are also stable. Prominent atherosclerotic calcifications noted within the carotid siphons and distal vertebral arteries.  There is no acute intracranial hemorrhage or large vessel territory infarct. No mass lesion or midline shift. Gray-white matter differentiation is well maintained. Ventricles are normal in size without evidence of hydrocephalus. No extra-axial fluid collection.  The calvarium is intact.  Orbital soft tissues are within normal limits.  There is complete opacification of the left maxillary sinus with mild polypoid opacity within the right maxillary sinus. Minimal opacity seen within the inferior right frontal sinus. No mastoid effusion.  Scalp soft tissues are unremarkable.  IMPRESSION: 1. No acute intracranial process identified. 2. Mild atrophy with moderate chronic microvascular ischemic disease and remote right cerebellar infarct, unchanged. 3. Paranasal sinus disease as above, similar relative to recent CT from 10/24/2013.   Electronically Signed   By: Jeannine Boga M.D.   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 Recurrent acute left cerebral infarction cannot be ruled out.  Recommendations: 1. MRI of the brain without contrast 2. Stroke workup with risk assessment if MRI demonstrates acute stroke 3. EEG, routine adult 4. Given his history of stroke and focal nature of probable seizure activity, recommending starting Keppra 500 mg twice a day. 5. Continue Plavix 75 mg per day.  C.R. Nicole Kindred, MD [facility] [phone]  10/31/2013, 9:17 PM

## 2013-10-31 NOTE — ED Notes (Signed)
Dr. Stewart in to assess pt at this time 

## 2013-10-31 NOTE — ED Notes (Signed)
Pt remains alert and oriented x's 3.  Pt updated on plan of care. Waiting for admitting MD at this time.  No complaints voiced by pt.

## 2013-11-01 ENCOUNTER — Inpatient Hospital Stay (HOSPITAL_COMMUNITY): Payer: Medicaid Other

## 2013-11-01 ENCOUNTER — Ambulatory Visit (HOSPITAL_COMMUNITY): Payer: Self-pay | Admitting: Dentistry

## 2013-11-01 ENCOUNTER — Encounter (HOSPITAL_COMMUNITY): Payer: Self-pay | Admitting: Emergency Medicine

## 2013-11-01 ENCOUNTER — Telehealth: Payer: Self-pay | Admitting: *Deleted

## 2013-11-01 DIAGNOSIS — I6350 Cerebral infarction due to unspecified occlusion or stenosis of unspecified cerebral artery: Secondary | ICD-10-CM

## 2013-11-01 DIAGNOSIS — N179 Acute kidney failure, unspecified: Secondary | ICD-10-CM

## 2013-11-01 DIAGNOSIS — R259 Unspecified abnormal involuntary movements: Secondary | ICD-10-CM

## 2013-11-01 DIAGNOSIS — R4789 Other speech disturbances: Secondary | ICD-10-CM

## 2013-11-01 DIAGNOSIS — I10 Essential (primary) hypertension: Secondary | ICD-10-CM

## 2013-11-01 DIAGNOSIS — R569 Unspecified convulsions: Secondary | ICD-10-CM | POA: Diagnosis present

## 2013-11-01 DIAGNOSIS — C61 Malignant neoplasm of prostate: Secondary | ICD-10-CM

## 2013-11-01 DIAGNOSIS — J449 Chronic obstructive pulmonary disease, unspecified: Secondary | ICD-10-CM

## 2013-11-01 LAB — HEMOGLOBIN A1C
Hgb A1c MFr Bld: 5.6 % (ref <5.7)
Mean Plasma Glucose: 114 mg/dL (ref <117)

## 2013-11-01 LAB — LIPID PANEL
CHOL/HDL RATIO: 3.3 ratio
Cholesterol: 95 mg/dL (ref 0–200)
HDL: 29 mg/dL — AB (ref >39)
LDL CALC: 40 mg/dL (ref 0–99)
Triglycerides: 128 mg/dL (ref <150)
VLDL: 26 mg/dL (ref 0–40)

## 2013-11-01 MED ORDER — LEVETIRACETAM 500 MG PO TABS
500.0000 mg | ORAL_TABLET | Freq: Two times a day (BID) | ORAL | Status: DC
  Administered 2013-11-01: 500 mg via ORAL
  Filled 2013-11-01 (×3): qty 1

## 2013-11-01 MED ORDER — ALBUTEROL SULFATE HFA 108 (90 BASE) MCG/ACT IN AERS: 2.0000 | INHALATION_SPRAY | Freq: Four times a day (QID) | RESPIRATORY_TRACT | Status: DC | PRN

## 2013-11-01 MED ORDER — SODIUM CHLORIDE 0.9 % IV SOLN
INTRAVENOUS | Status: AC
  Administered 2013-11-01: 03:00:00 via INTRAVENOUS

## 2013-11-01 MED ORDER — ATORVASTATIN CALCIUM 80 MG PO TABS
80.0000 mg | ORAL_TABLET | Freq: Every day | ORAL | Status: DC
  Administered 2013-11-01 – 2013-11-02 (×2): 80 mg via ORAL
  Filled 2013-11-01 (×3): qty 1

## 2013-11-01 MED ORDER — ALBUTEROL SULFATE (2.5 MG/3ML) 0.083% IN NEBU: 2.5000 mg | INHALATION_SOLUTION | Freq: Four times a day (QID) | RESPIRATORY_TRACT | Status: DC | PRN

## 2013-11-01 MED ORDER — LEVETIRACETAM 500 MG PO TABS
500.0000 mg | ORAL_TABLET | Freq: Two times a day (BID) | ORAL | Status: DC
  Administered 2013-11-01: 500 mg via ORAL
  Filled 2013-11-01 (×2): qty 1

## 2013-11-01 MED ORDER — CLOPIDOGREL BISULFATE 75 MG PO TABS
75.0000 mg | ORAL_TABLET | Freq: Every day | ORAL | Status: DC
  Administered 2013-11-01 – 2013-11-03 (×3): 75 mg via ORAL
  Filled 2013-11-01 (×3): qty 1

## 2013-11-01 MED ORDER — SENNOSIDES-DOCUSATE SODIUM 8.6-50 MG PO TABS
1.0000 | ORAL_TABLET | Freq: Every evening | ORAL | Status: DC | PRN
Start: 2013-11-01 — End: 2013-11-03
  Filled 2013-11-01: qty 1

## 2013-11-01 MED ORDER — ADULT MULTIVITAMIN W/MINERALS CH
1.0000 | ORAL_TABLET | Freq: Every day | ORAL | Status: DC
  Administered 2013-11-01 – 2013-11-03 (×3): 1 via ORAL
  Filled 2013-11-01 (×3): qty 1

## 2013-11-01 MED ORDER — TIOTROPIUM BROMIDE MONOHYDRATE 18 MCG IN CAPS
18.0000 ug | ORAL_CAPSULE | Freq: Every day | RESPIRATORY_TRACT | Status: DC
  Filled 2013-11-01: qty 5

## 2013-11-01 MED ORDER — HEPARIN SODIUM (PORCINE) 5000 UNIT/ML IJ SOLN
5000.0000 [IU] | Freq: Three times a day (TID) | INTRAMUSCULAR | Status: DC
Start: 2013-11-02 — End: 2013-11-03
  Administered 2013-11-02 – 2013-11-03 (×4): 5000 [IU] via SUBCUTANEOUS
  Filled 2013-11-01 (×7): qty 1

## 2013-11-01 NOTE — Progress Notes (Signed)
VASCULAR LAB PRELIMINARY  PRELIMINARY  PRELIMINARY  PRELIMINARY  Carotid duplex  completed.    Preliminary report:  Bilateral:  1-39% ICA stenosis.  Vertebral artery flow is antegrade.      Norina Cowper, RVT 11/01/2013, 4:17 PM

## 2013-11-01 NOTE — Telephone Encounter (Signed)
Call from Merrilee Seashore, Physical Therapist with [facility] - # [phone] PT asking for Verbal order for therapy to see pt 2 times a week for 1 week, then one time a week for one week.  I looked in chart and noted pt was admitted yesterday.  Still in patient. PT informed

## 2013-11-01 NOTE — ED Notes (Signed)
Admitting MD at bedside to assess pt for admission 

## 2013-11-01 NOTE — Progress Notes (Signed)
Subjective: [name] is doing about the same this morning.  Had MRI and EEG this morning, eating now.  Still having BLE weakness (chronic?) but no further involuntary movements.   Objective: Vital signs in last 24 hours: Filed Vitals:   11/01/13 0400 11/01/13 0500 11/01/13 0607 11/01/13 0613  BP: 163/99 159/89  144/75  Pulse: 89 78  77  Temp:    98.3 F (36.8 C)  TempSrc:    Oral  Resp:  22    Height:   5\' 8"  (1.727 m) 5\' 8"  (1.727 m)  Weight:   164 lb 0.4 oz (74.4 kg) 158 lb 3.2 oz (71.759 kg)  SpO2: 97% 98%  100%   Weight change:   Intake/Output Summary (Last 24 hours) at 11/01/13 0959 Last data filed at 11/01/13 0439  Gross per 24 hour  Intake      0 ml  Output    202 ml  Net   -202 ml   PEX General: alert, cooperative, and in no apparent distress HEENT: NCAT, vision grossly intact, mild oral swelling (improved since last admission) Neck: supple, no lymphadenopathy Lungs: clear to ascultation bilaterally, normal work of respiration, no wheezes, rales, ronchi Heart: regular rate and rhythm, no murmurs, gallops, or rubs Abdomen: soft, non-tender, non-distended, normal bowel sounds Extremities: 2+ DP/PT pulses bilaterally, no cyanosis, clubbing, or edema Neurologic: alert & oriented X3, cranial nerves II-XII intact, sensation intact to light touch, strength 4+/5 in BLE  Lab Results: Basic Metabolic Panel:  Recent Labs Lab 10/31/13 1952  NA 143  K 3.9  CL 105  GLUCOSE 83  BUN 28*  CREATININE 1.30   CBC:  Recent Labs Lab 10/31/13 1952  HGB 11.2*  HCT 33.0*    Micro Results: Recent Results (from the past 240 hour(s))  CULTURE, BLOOD (ROUTINE X 2)     Status: None   Collection Time    10/24/13  6:45 PM      Result Value Ref Range Status   Specimen Description BLOOD LEFT ARM   Final   Special Requests BOTTLES DRAWN AEROBIC AND ANAEROBIC 5CC   Final   Culture  Setup Time     Final   Value: 10/25/2013 01:02     Performed at [facility]   Culture     Final   Value: NO GROWTH 5 DAYS     Performed at [facility]   Report Status 10/31/2013 FINAL   Final  CULTURE, BLOOD (ROUTINE X 2)     Status: None   Collection Time    10/24/13  7:17 PM      Result Value Ref Range Status   Specimen Description BLOOD RIGHT FOREARM   Final   Special Requests BOTTLES DRAWN AEROBIC AND ANAEROBIC 5CC   Final   Culture  Setup Time     Final   Value: 10/25/2013 01:04     Performed at [facility]   Culture     Final   Value: NO GROWTH 5 DAYS     Performed at [facility]   Report Status 10/31/2013 FINAL   Final  URINE CULTURE     Status: None   Collection Time    10/25/13  4:10 AM      Result Value Ref Range Status   Specimen Description URINE, CLEAN CATCH   Final   Special Requests Normal   Final   Culture  Setup Time     Final   Value: 10/25/2013 08:05  Performed at [facility]     Final   Value: >=100,000 COLONIES/ML     Performed at [facility]   Culture     Final   Value: Multiple bacterial morphotypes present, none predominant. Suggest appropriate recollection if clinically indicated.     Performed at [facility]   Report Status 10/26/2013 FINAL   Final  GRAM STAIN     Status: None   Collection Time    10/25/13  4:11 AM      Result Value Ref Range Status   Specimen Description URINE, CLEAN CATCH   Final   Special Requests Normal   Final   Gram Stain     Final   Value: [id number]     WBC PRESENT,BOTH PMN AND MONONUCLEAR     Multiple bacterial morphotypes present, none predominant. Suggest appropriate recollection if clinically indicated.     YEAST     Results Called toZelphia Cairo RN [id number] Perlie Mayo   Report Status 10/25/2013 FINAL   Final   Studies/Results: Dg Chest 2 View  11/01/2013   CLINICAL DATA:  Stroke.  Weakness.  Hypertension.  EXAM: CHEST  2 VIEW  COMPARISON:  10/24/2013  FINDINGS: Cardiac silhouette is normal in size and configuration. The  aorta is mildly uncoiled. No mediastinal or hilar masses.  Lungs show vague nodular opacities most evident near the right apex. There is no lung consolidation to suggest pneumonia. No pulmonary edema. No pleural effusion or pneumothorax.  Bony thorax is demineralized.  IMPRESSION: 1. Vague nodular opacities are suggested in the lungs. Recommend followup chest CT. 2. No focal consolidation to suggest pneumonia. No evidence of pulmonary edema.   Electronically Signed   By: Lajean Manes M.D.   On: 11/01/2013 09:14   Ct Head Wo Contrast  10/31/2013   CLINICAL DATA:  Left-sided weakness  EXAM: CT HEAD WITHOUT CONTRAST  TECHNIQUE: Contiguous axial images were obtained from the base of the skull through the vertex without intravenous contrast.  COMPARISON:  Prior CT from 10/24/2013  FINDINGS: Encephalomalacia within the right cerebellar hemisphere again seen, Stable relative to prior exam. Mild atrophy with moderate chronic microvascular ischemic changes are also stable. Prominent atherosclerotic calcifications noted within the carotid siphons and distal vertebral arteries.  There is no acute intracranial hemorrhage or large vessel territory infarct. No mass lesion or midline shift. Gray-white matter differentiation is well maintained. Ventricles are normal in size without evidence of hydrocephalus. No extra-axial fluid collection.  The calvarium is intact.  Orbital soft tissues are within normal limits.  There is complete opacification of the left maxillary sinus with mild polypoid opacity within the right maxillary sinus. Minimal opacity seen within the inferior right frontal sinus. No mastoid effusion.  Scalp soft tissues are unremarkable.  IMPRESSION: 1. No acute intracranial process identified. 2. Mild atrophy with moderate chronic microvascular ischemic disease and remote right cerebellar infarct, unchanged. 3. Paranasal sinus disease as above, similar relative to recent CT from 10/24/2013.   Electronically  Signed   By: Jeannine Boga M.D.   On: 10/31/2013 18:50   [name]  11/01/2013   CLINICAL DATA:  Stroke. Prostate cancer. Right leg twitching. Inability to speak.  EXAM: MRI HEAD WITHOUT CONTRAST  TECHNIQUE: Multiplanar, multiecho pulse sequences of the brain and surrounding structures were obtained without intravenous contrast.  COMPARISON:  CT HEAD W/O CM dated 10/31/2013; MR HEAD W/O CM dated 12/05/2012; CT HEAD W/O CM dated 10/24/2013  FINDINGS: The patient was unable to remain motionless for the exam. Small or subtle lesions could be overlooked.  Patchy and variably hyperintense areas of restricted diffusion are seen within the medial right frontal lobe and medial right parietal lobe consistent with an acute to subacute right ACA territory infarct. On the previous MR from 2014, a more proximal right ACA infarct affected the genu of the corpus callosum. Pattern of ischemia today is more distal and more superior. No hemorrhage is present.  Chronic right cerebellar infarct. Generalized atrophy. Extensive small vessel disease throughout the periventricular and subcortical white matter. Flow voids are maintained in the carotid, basilar, and vertebral arteries. Proximally, both anterior cerebral arteries appear patent although patency of the distal right ACA in the interhemispheric fissure cannot clearly be established.  Tiny focus chronic hemorrhage left cerebellum and left external capsule where there is a remote hemorrhage/lacunar infarct.  No calvarial or skull base lesions. No visible dural tumor deposits on this noncontrast exam. Unremarkable pituitary and cerebellar tonsils. Complete or near-complete filling of the left maxillary sinus with fluid or inflammatory tissue.  IMPRESSION: Acute to subacute right ACA territory infarct affecting the medial frontal lobe greater than the medial parietal lobe. No hemorrhage or mass effect.  Chronic right cerebellar infarct and chronic right ACA corpus  callosum infarct. 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 Generalized atrophy with extensive small vessel disease.  No visible intracranial, calvarial or skull base lesions in this patient with prostate cancer.   Electronically Signed   By: Rolla Flatten M.D.   On: 11/01/2013 09:11   Medications: I have reviewed the patient's current medications. Scheduled Meds: . atorvastatin  80 mg Oral q1800  . clopidogrel  75 mg Oral Q breakfast  . [START ON 11/02/2013] heparin  5,000 Units Subcutaneous 3 times per day  . levETIRAcetam  500 mg Oral BID  . multivitamin with minerals  1 tablet Oral Daily  . tiotropium  18 mcg Inhalation Daily   Continuous Infusions:  PRN Meds:.albuterol, senna-docusate Assessment/Plan: #Stroke- Patient presented with RLE spontaneous and involuntary movements at rest as well as speech difficulty, both of which had subsided by the time of admission.  No other neurologic symptoms including loss of consciousness, incontinence, or facial changes,  decreased strength or sensory abnormalities.  Patient has history of two previous strokes, most recent in 11/2012 with residual left leg weakness; on Plavix 75 mg daily at home (ASA allergy).  CT head on admission showed no acute intracranial abnormalities.  However, MRI showed acute to subacute right ACA infarct affecting medial frontal lobe (no hemorrhage or mass effect) thus proceeding with stroke workup.  Risk stratification: LDL 40.   -neurology consult, appreciate recs; have alerted both Dr. Leonel Ramsay and Dr. Leonie Man about MRI findings -continue Plavix 75 mg daily, Lipitor 80 mg daily  -EEG read pending -continue Keppra 500 mg BID pending neurology recs, seizure precautions -neuro checks q4h -A1C pending -PT/OT evals pending  #HTN- On HCTZ + Lisinopril at home. BP at goal for age but holding anti-hypertensives for permissive HTN.    #Mild AKI- Admission Cr 1.30, baseline  ~1.0.  Patient given NS at 75 cc/hr x 8 hours.  Now eating and drinking well.  -BMP in AM   #COPD- Stable, lungs CTAB, no concern for acute exacerbation at this time.  -continue Spiriva daily, Albuterol prn   #Prostate Cancer- Patient following with Dr. Janice Norrie, see above. Per patient, last PSA <1. Follow-up outpatient.  Patient will likely need follow up bone scan on outpatient.   #Bed Bugs- Patient found to have bed bugs at previous admission.  -contact isolation   Dispo: Disposition is deferred at this time, awaiting improvement of current medical problems.  Anticipated discharge in approximately 1-2 day(s).    The patient does have a current PCP Corky Sox, MD) and does need an [facility] hospital follow-up appointment after discharge.   .Services Needed at time of discharge: Y = Yes, Blank = No PT:   OT:   RN:   Equipment:   Other:     LOS: 1 day   Ivin Poot, MD 11/01/2013, 9:59 AM

## 2013-11-01 NOTE — ED Notes (Signed)
Stroke Swallow Screen obtained and passed without any problems.

## 2013-11-01 NOTE — Progress Notes (Signed)
Utilization review completed.  

## 2013-11-01 NOTE — H&P (Signed)
Date: 11/01/2013               Patient Name:  [name] MRN: [id number]  DOB: [birth year] Age / Sex: 63 y.o., male   PCP: Corky Sox, MD         Medical Service: Internal Medicine Teaching Service         Attending Physician: Dr. Madilyn Fireman, MD    First Contact: Dr. Stann Mainland Pager: [phone]  Second Contact: Dr. Algis Liming Pager: [phone]       After Hours (After 5p/  First Contact Pager: [phone]  weekends / holidays): Second Contact Pager: [phone]   Chief Complaint: RLE twitching w/ difficulty speaking  History of Present Illness: [name] is a 63 y.o. male w/ PMHx of HTN, COPD, metastatic prostate cancer, stroke in 11/2012, recently discharged from the hospital for multiple tooth extractions and b/l LE weakness, presents w/ uncontrolled RLE twitching and difficulty speaking. The patient claims he was at home watching TV on the couch when he noticed his right heel and foot starting to twitch involuntarily for some time, then involving th majority of his lower leg. He also noticed that he started to have difficulty with speaking, saying he was not able to produce words appropriately. He denies any other symptoms. No involvement of the LLE or upper extremities, no urinary or fecal incontinence, no loss of consciousness. He also denies any facial changes, vision changes, nausea or vomiting, chest pain, SOB, dizziness, lightheadedness, or palpitations.  The patient was recently admitted to the hospital twice on 10/23/13 for 22 tooth extractions, and re-admitted on 10/24/13 (shortly after discharge) for LE weakness of unknown etiology. MRI of the lumbar spine was performed at that time which showed multilevel degenerative changes w/ spinal stenosis, but w/ no obvious cord compression. The patient has been noted to have T6 metastasis from prostate CA on a past bone scan as well as a significant CVA in 11/2012.   Meds: No current facility-administered medications for this encounter.    Current Outpatient Prescriptions  Medication Sig Dispense Refill  . albuterol (PROVENTIL HFA;VENTOLIN HFA) 108 (90 BASE) MCG/ACT inhaler Inhale 2 puffs into the lungs every 6 (six) hours as needed for wheezing.  8.5 g  3  . atorvastatin (LIPITOR) 80 MG tablet Take 1 tablet (80 mg total) by mouth daily at 6 PM.  30 tablet  5  . CALCIUM PO Take 1 tablet by mouth daily.      . clopidogrel (PLAVIX) 75 MG tablet Take 1 tablet (75 mg total) by mouth daily with breakfast.      . hydrochlorothiazide (HYDRODIURIL) 25 MG tablet Take 1 tablet (25 mg total) by mouth daily.  30 tablet  5  . lisinopril (PRINIVIL,ZESTRIL) 10 MG tablet Take 1 tablet (10 mg total) by mouth daily.  30 tablet  0  . Multiple Vitamin (MULTIVITAMIN WITH MINERALS) TABS tablet Take 1 tablet by mouth daily.      Marland Kitchen oxyCODONE-acetaminophen (PERCOCET/ROXICET) 5-325 MG per tablet Take 1-2 tablets by mouth every 4 (four) hours as needed for moderate pain.  35 tablet  0  . tiotropium (SPIRIVA HANDIHALER) 18 MCG inhalation capsule Place 1 capsule (18 mcg total) into inhaler and inhale daily.  30 capsule  5    Allergies: Allergies as of 10/31/2013 - Review Complete 10/31/2013  Allergen Reaction Noted  . Aspirin Hives 10/18/2012  . Penicillins Hives 10/18/2012   Past Medical History  Diagnosis Date  . Hypertension 05/18/2007  .  COPD 05/18/2007  . Elevated PSA 05/25/2007  . 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 Stroke 12/05/2012  . Prostate cancer    Past Surgical History  Procedure Laterality Date  . 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 Multiple extractions with alveoloplasty N/A 10/23/2013    Procedure: Extraction of tooth #'s 1,2,3,4,5,6,7,11,12,13,14,15,16,20,21,22,23,24,27,28,29,32 with alveoloplasty and bilateral maxillary tuberosity reductions;  Surgeon: Lenn Cal, DDS;  Location: [facility];  Service: Oral Surgery;  Laterality: N/A;  . Leg surgery      pin  right leg   Family History  Problem Relation  Age of Onset  . Liver disease Mother   . Heart attack Mother 61  . Colon cancer Neg Hx    History   Social History  . Marital Status: Single    Spouse Name: N/A    Number of Children: 0  . Years of Education: N/A   Occupational History  . Flooring instalation     Retired   Social History Main Topics  . Smoking status: Current Every Day Smoker -- 0.50 packs/day for 50 years    Types: Cigarettes  . Smokeless tobacco: Never Used     Comment: 1/2 ppd, tobacco info given 10/16/13  . Alcohol Use: 7 - 10.5 oz/week    14-21 drink(s) per week     Comment: 2-3 beers per day  . Drug Use: No     Comment: history of cocaine use  . Sexual Activity: Not Currently   Other Topics Concern  . Not on file   Social History Narrative   Drinks 1/2 pt of wine daily and beer daily (at least 12 oz)   Denies recreational drugs    Smokes 3-4 cigarettes. 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    2 years of college at [employer] per patient           Review of Systems: General: Denies fever, chills, diaphoresis, appetite change and fatigue.  HEENT: Denies bleeding, facial changes, or vision loss.  Respiratory: Denies SOB, DOE, cough, chest tightness, and wheezing.  Cardiovascular: Denies chest pain and palpitations.  Gastrointestinal: Denies nausea, vomiting, abdominal pain, diarrhea, constipation, blood in stool and abdominal distention.  Genitourinary: Denies dysuria, urgency, frequency, hematuria, and flank pain.  Endocrine: Denies hot or cold intolerance, polyuria, and polydipsia.  Musculoskeletal: Denies myalgias, back pain, joint swelling, arthralgias and gait problem.  Skin: Denies pallor, rash and wounds.  Neurological: Positive for LE weakness and involuntary RLE twitching and difficulty w/ speech. Denies dizziness, seizures, syncope, lightheadedness, numbness and headaches.  Psychiatric/Behavioral: Denies mood changes, confusion,  nervousness, sleep disturbance and agitation   Physical Exam: Filed Vitals:   10/31/13 1748 10/31/13 1750 10/31/13 1800 10/31/13 2230  BP:  158/83 150/69 123/69  Pulse:  84 81 81  Temp:  98.4 F (36.9 C)    TempSrc:  Oral    Resp:  23 20   Height: 5\' 9"  (1.753 m)     Weight: 184 lb (83.462 kg)     SpO2:  98% 100% 96%   General: Vital signs reviewed.  Patient is a well-developed and well-nourished, in no acute distress and cooperative with exam.  Head: Normocephalic and atraumatic. Eyes: PERRL, EOMI, conjunctivae normal, No scleral icterus.  Neck: Supple, trachea midline, normal ROM, No JVD, masses, thyromegaly, or carotid bruit present.  Cardiovascular: RRR, S1 normal, S2 normal, no murmurs, gallops, or rubs. Pulmonary/Chest: [employer]  entry equal bilaterally, no wheezes, rales, or rhonchi. Abdominal: Soft, non-tender, non-distended, BS +, no masses, organomegaly, or guarding present.  Musculoskeletal: No joint deformities, erythema, or stiffness, ROM full and nontender. Extremities: Pretibial mass on LLE (chronic). No swelling or edema,  pulses symmetric and intact bilaterally. No cyanosis or clubbing. Neurological: A&O x3, Strength is normal and symmetric bilaterally (4-5/5 in upper and lower extremities bilaterally), cranial nerve II-XII are grossly intact, no focal motor deficit, sensory intact to light touch bilaterally.  Skin: Warm, dry and intact. No rashes or erythema. Psychiatric: Normal mood and affect. Speech at baseline, behavior is normal. Cognition and memory are normal.   Lab results: Basic Metabolic Panel:  Recent Labs  10/31/13 1952  NA 143  K 3.9  CL 105  GLUCOSE 83  BUN 28*  CREATININE 1.30   CBC:  Recent Labs  10/31/13 1952  HGB 11.2*  HCT 33.0*   Imaging results:  Ct Head Wo Contrast  10/31/2013   CLINICAL DATA:  Left-sided weakness  EXAM: CT HEAD WITHOUT CONTRAST  TECHNIQUE: Contiguous axial images were obtained from the base of the skull through  the vertex without intravenous contrast.  COMPARISON:  Prior CT from 10/24/2013  FINDINGS: Encephalomalacia within the right cerebellar hemisphere again seen, Stable relative to prior exam. Mild atrophy with moderate chronic microvascular ischemic changes are also stable. Prominent atherosclerotic calcifications noted within the carotid siphons and distal vertebral arteries.  There is no acute intracranial hemorrhage or large vessel territory infarct. No mass lesion or midline shift. Gray-white matter differentiation is well maintained. Ventricles are normal in size without evidence of hydrocephalus. No extra-axial fluid collection.  The calvarium is intact.  Orbital soft tissues are within normal limits.  There is complete opacification of the left maxillary sinus with mild polypoid opacity within the right maxillary sinus. Minimal opacity seen within the inferior right frontal sinus. No mastoid effusion.  Scalp soft tissues are unremarkable.  IMPRESSION: 1. No acute intracranial process identified. 2. Mild atrophy with moderate chronic microvascular ischemic disease and remote right cerebellar infarct, unchanged. 3. Paranasal sinus disease as above, similar relative to recent CT from 10/24/2013.   Electronically Signed   By: Jeannine Boga M.D.   On: 10/31/2013 18:50   Other results: EKG: NSR, LAFB, borderline prolonged PR interval.   Assessment & Plan by Problem: Mr. KREG EARHART is a 63 y.o. male w/ PMHx of HTN, COPD, metastatic prostate cancer, stroke in 11/2012, admitted for questionable focal seizure activity vs TIA.  RLE twitching and speech difficulty- Patient claims he had RLE spontaneous and involuntary movement at rest as well as speech difficulty, both of which had subsided by the time of examination. No other symptoms. Denies involvement of other extremities, loss of consciousness, incontinence, or facial changes. No decreased strength or sensory abnormalities. CT head performed in the  ED shows no acute intracranial abnormalities. Seen by neurology in ED, recommend MRI, if positive, complete stroke workup. -Admitted to telemetry -EEG ordered -MRI pending; if evidence of acute stroke, full stroke workup w/ risk assessment -Started Keppra 500 mg bid per neurology -Plavix 75 mg po qd -HbA1c + lipid panel -Lipitor 80 mg qhs -Neuro checks q2h -PT/OT/SLP eval -NPO for now -Seizure precautions  HTN- On HCTZ + Lisinopril at home. Pressure slightly elevated in ED. -Hold BP meds for now to allow for permissive HTN  Mild AKI- Patient w/ Cr of 1.30, baseline ~1.0. -NS @ 75 ml/hr -Repeat BMP in AM  COPD- Stable, lungs  CTAB, no concern for acute exacerbation at this time.  -Continue Spiriva daily, Albuterol prn   Prostate Cancer- Patient following with Dr. Janice Norrie, see above. Per patient, last PSA <1. Follow-up outpatient.  -Will likely need follow up bone scan on outpatient basis  Bed Bugs- Patient found to have bed bugs on previous admission.  -Contact isolation  DVT/PE PPx- Heparin [place]  Dispo: Disposition is deferred at this time, awaiting improvement of current medical problems. Anticipated discharge in approximately 1-2 day(s).   The patient does have a current PCP Corky Sox, MD) and does need an [facility] hospital follow-up appointment after discharge.  The patient does not have transportation limitations that hinder transportation to clinic appointments.  Signed: Corky Sox, MD 11/01/2013, 12:32 AM

## 2013-11-01 NOTE — Progress Notes (Addendum)
NEURO HOSPITALIST PROGRESS NOTE   SUBJECTIVE:                                                                                                                        No further leg twitching, no complaints.   OBJECTIVE:                                                                                                                           Vital signs in last 24 hours: Temp:  [97.8 F (36.6 C)-98.4 F (36.9 C)] 97.8 F (36.6 C) (03/25 1336) Pulse Rate:  [77-92] 84 (03/25 1336) Resp:  [18-23] 18 (03/25 1336) BP: (111-180)/(61-99) 132/78 mmHg (03/25 1336) SpO2:  [92 %-100 %] 100 % (03/25 1336) Weight:  [71.759 kg (158 lb 3.2 oz)-83.462 kg (184 lb)] 71.759 kg (158 lb 3.2 oz) (03/25 [id number])  Intake/Output from previous day: 03/24 0701 - 03/25 0700 In: -  Out: 202 [Urine:200; Stool:2] Intake/Output this shift: Total I/O In: 240 [P.O.:240] Out: 2 [Stool:2] Nutritional status: Cardiac  Past Medical History  Diagnosis Date  . Hypertension 05/18/2007  . COPD 05/18/2007  . Elevated PSA 05/25/2007  . 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 Stroke 12/05/2012  . Prostate cancer      Neurologic Exam:  Mental Status:  Alert, oriented, thought content appropriate. Speech moderately dysarthric ( recent multiple tooth extractions) without evidence of aphasia. Able to follow commands without difficulty.  Cranial Nerves:  II-Visual fields were normal.  III/IV/VI-Pupils were equal and reacted. Extraocular movements were full and conjugate.  V/VII-no facial numbness and no facial weakness.  VIII-normal.  X-speech was moderately dysarthric but consistent with edentulous state; symmetrical palatal movement.  XII-midline tongue extension  Motor: Minimal hip flexor weakness, left greater than right, left knee extension is noticeably weaker than right. Sensory: Normal throughout.  Deep Tendon Reflexes: 1+ and symmetric.  Plantars: Flexor bilaterally   Cerebellar: Normal finger-to-nose testing.   Lab Results: Basic Metabolic Panel:  Recent Labs Lab 10/31/13 1952  NA 143  K 3.9  CL 105  GLUCOSE 83  BUN 28*  CREATININE 1.30    Liver Function Tests: No results found for this basename: AST, ALT, ALKPHOS, BILITOT, PROT, ALBUMIN,  in the last 168 hours No results found for this basename: LIPASE, AMYLASE,  in the last 168 hours No results found for this basename: AMMONIA,  in the last 168 hours  CBC:  Recent Labs Lab 10/31/13 1952  HGB 11.2*  HCT 33.0*    Cardiac Enzymes: No results found for this basename: CKTOTAL, CKMB, CKMBINDEX, TROPONINI,  in the last 168 hours  Lipid Panel:  Recent Labs Lab 11/01/13 0935  CHOL 95  TRIG 128  HDL 29*  CHOLHDL 3.3  VLDL 26  LDLCALC 40    CBG: No results found for this basename: GLUCAP,  in the last 168 hours  Microbiology: Results for orders placed during the hospital encounter of 10/24/13  CULTURE, BLOOD (ROUTINE X 2)     Status: None   Collection Time    10/24/13  6:45 PM      Result Value Ref Range Status   Specimen Description BLOOD LEFT ARM   Final   Special Requests BOTTLES DRAWN AEROBIC AND ANAEROBIC 5CC   Final   Culture  Setup Time     Final   Value: 10/25/2013 01:02     Performed at [facility]   Culture     Final   Value: NO GROWTH 5 DAYS     Performed at [facility]   Report Status 10/31/2013 FINAL   Final  CULTURE, BLOOD (ROUTINE X 2)     Status: None   Collection Time    10/24/13  7:17 PM      Result Value Ref Range Status   Specimen Description BLOOD RIGHT FOREARM   Final   Special Requests BOTTLES DRAWN AEROBIC AND ANAEROBIC 5CC   Final   Culture  Setup Time     Final   Value: 10/25/2013 01:04     Performed at [facility]   Culture     Final   Value: NO GROWTH 5 DAYS     Performed at [facility]   Report Status 10/31/2013 FINAL   Final  URINE CULTURE     Status: None   Collection Time    10/25/13  4:10  AM      Result Value Ref Range Status   Specimen Description URINE, CLEAN CATCH   Final   Special Requests Normal   Final   Culture  Setup Time     Final   Value: 10/25/2013 08:05     Performed at [place]     Final   Value: >=100,000 COLONIES/ML     Performed at [facility]   Culture     Final   Value: Multiple bacterial morphotypes present, none predominant. Suggest appropriate recollection if clinically indicated.     Performed at [facility]   Report Status 10/26/2013 FINAL   Final  GRAM STAIN     Status: None   Collection Time    10/25/13  4:11 AM      Result Value Ref Range Status   Specimen Description URINE, CLEAN CATCH   Final   Special Requests Normal   Final   Gram Stain     Final   Value: [id number]     WBC PRESENT,BOTH PMN AND MONONUCLEAR     Multiple bacterial morphotypes present, none predominant. Suggest appropriate recollection if clinically indicated.     YEAST     Results Called toZelphia Cairo RN [id number] Perlie Mayo   Report Status 10/25/2013 FINAL  Final    Coagulation Studies: No results found for this basename: LABPROT, INR,  in the last 72 hours  Imaging: Dg Chest 2 View  11/01/2013   CLINICAL DATA:  Stroke.  Weakness.  Hypertension.  EXAM: CHEST  2 VIEW  COMPARISON:  10/24/2013  FINDINGS: Cardiac silhouette is normal in size and configuration. The aorta is mildly uncoiled. No mediastinal or hilar masses.  Lungs show vague nodular opacities most evident near the right apex. There is no lung consolidation to suggest pneumonia. No pulmonary edema. No pleural effusion or pneumothorax.  Bony thorax is demineralized.  IMPRESSION: 1. Vague nodular opacities are suggested in the lungs. Recommend followup chest CT. 2. No focal consolidation to suggest pneumonia. No evidence of pulmonary edema.   Electronically Signed   By: Lajean Manes M.D.   On: 11/01/2013 09:14   Ct Head Wo Contrast  10/31/2013   CLINICAL DATA:   Left-sided weakness  EXAM: CT HEAD WITHOUT CONTRAST  TECHNIQUE: Contiguous axial images were obtained from the base of the skull through the vertex without intravenous contrast.  COMPARISON:  Prior CT from 10/24/2013  FINDINGS: Encephalomalacia within the right cerebellar hemisphere again seen, Stable relative to prior exam. Mild atrophy with moderate chronic microvascular ischemic changes are also stable. Prominent atherosclerotic calcifications noted within the carotid siphons and distal vertebral arteries.  There is no acute intracranial hemorrhage or large vessel territory infarct. No mass lesion or midline shift. Gray-white matter differentiation is well maintained. Ventricles are normal in size without evidence of hydrocephalus. No extra-axial fluid collection.  The calvarium is intact.  Orbital soft tissues are within normal limits.  There is complete opacification of the left maxillary sinus with mild polypoid opacity within the right maxillary sinus. Minimal opacity seen within the inferior right frontal sinus. No mastoid effusion.  Scalp soft tissues are unremarkable.  IMPRESSION: 1. No acute intracranial process identified. 2. Mild atrophy with moderate chronic microvascular ischemic disease and remote right cerebellar infarct, unchanged. 3. Paranasal sinus disease as above, similar relative to recent CT from 10/24/2013.   Electronically Signed   By: Jeannine Boga M.D.   On: 10/31/2013 18:50   [name] Wo Contrast  11/01/2013   CLINICAL DATA:  Stroke. Prostate cancer. Right leg twitching. Inability to speak.  EXAM: MRI HEAD WITHOUT CONTRAST  TECHNIQUE: Multiplanar, multiecho pulse sequences of the Pittman and surrounding structures were obtained without intravenous contrast.  COMPARISON:  CT HEAD W/O CM dated 10/31/2013; Terry HEAD W/O CM dated 12/05/2012; CT HEAD W/O CM dated 10/24/2013  FINDINGS: The patient was unable to remain motionless for the exam. Small or subtle lesions could be overlooked.   Patchy and variably hyperintense areas of restricted diffusion are seen within the medial right frontal lobe and medial right parietal lobe consistent with an acute to subacute right ACA territory infarct. On the previous Terry from 2014, a more proximal right ACA infarct affected the genu of the corpus callosum. Pattern of ischemia today is more distal and more superior. No hemorrhage is present.  Chronic right cerebellar infarct. Generalized atrophy. Extensive small vessel disease throughout the periventricular and subcortical white matter. Flow voids are maintained in the carotid, basilar, and vertebral arteries. Proximally, both anterior cerebral arteries appear patent although patency of the distal right ACA in the interhemispheric fissure cannot clearly be established.  Tiny focus chronic hemorrhage left cerebellum and left external capsule where there is a remote hemorrhage/lacunar infarct.  No calvarial or skull base lesions. No visible dural tumor  deposits on this noncontrast exam. Unremarkable pituitary and cerebellar tonsils. Complete or near-complete filling of the left maxillary sinus with fluid or inflammatory tissue.  IMPRESSION: Acute to subacute right ACA territory infarct affecting the medial frontal lobe greater than the medial parietal lobe. No hemorrhage or mass effect.  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 Generalized atrophy with extensive small vessel disease.  No visible intracranial, calvarial or skull base lesions in this patient with prostate cancer.   Electronically Signed   By: Rolla Flatten M.D.   On: 11/01/2013 09:11       MEDICATIONS                                                                                                                        Scheduled: . atorvastatin  80 mg Oral q1800  . clopidogrel  75 mg Oral Q breakfast  .  [START ON 11/02/2013] heparin  5,000 Units Subcutaneous 3 times per day  . levETIRAcetam  500 mg Oral BID  . multivitamin with minerals  1 tablet Oral Daily  . 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  MRI reveals a new right ACA territory infarct.   Recommend: 1) Continue Keppra 500 mg BID 2) EEG reading pending 3) Continue with Stroke work up--stroke team to follow in AM.  4) Continue Plavix 75 mg daily   Assessment and plan discussed with with attending physician and they are in agreement.    Etta Quill PA-C [facility] [phone]  11/01/2013, 2:11 PM  I have seen and evaluated the patient. I have reviewed the above note and made appropriate changes.     The twitching he describes is shaking when he tried to put weight on the leg. It did not occur spontaneously and affected the left leg. MRI demonstrates ACA territory infarct.   Would discontinue keppra as no indication of seizure.    Roland Rack, MD [facility] [phone]  If 7pm- 7am, please page neurology on call as listed in [place].

## 2013-11-01 NOTE — ED Notes (Signed)
Pt given urinal at his request

## 2013-11-01 NOTE — H&P (Signed)
Internal Medicine Attending Admission Note Date: 11/01/2013  Patient name: [name] Medical record number: [id number] Date of birth: 1951-05-22 Age: 63 y.o. Gender: male  I saw and evaluated the patient. I reviewed the resident's note and I agree with the resident's findings and plan as documented in the resident's note.  Mr. Rampey is a 63 year old man with a history of metastatic prostate cancer, previous CVA in April of 2014, hypertension, and chronic obstructive pulmonary disease who presents with the acute onset of bilateral lower extremity weakness and uncontrolled right lower extremity twitching and difficulty speaking. He has been discharged from the hospital twice in the past month after a total tooth extraction and subsequent weakness. The cause of his lower extremity weakness was not elucidated during the previous admission although he was found to have spinal stenosis secondary to degenerative changes with no cord compression. At home yesterday, he was watching television when he noted the onset of right foot twitching that was involuntary. It progressed to involve most of the lower right extremity. At that time he also noted difficulty with speaking, particularly having a hard time producing words. He denied any headaches, visual changes, nausea, vomiting, shortness of breath, dizziness, palpitations, or other acute neurologic symptoms. He presented to the emergency department and was admitted to the internal medicine teaching service for further evaluation. My examination this morning revealed bilateral lower extremity strength of 4+/5, no twitching, and speech that was difficult to understand secondary to his recent teeth extraction but otherwise not dysarthric secondary to an underlying neurologic process.  Since admission he has been evaluated by neurology who recommended an MRI and EEG. The EEG was just completed and the results are pending at the time of this dictation. The MRI  demonstrated an acute to subacute right ACA territory infarct affecting the medial frontal lobe greater than the medial parietal lobe. There is no hemorrhage, mass effect, or evidence of metastatic disease although he had a chronic right cerebellar infarct and small vessel disease. With this acute to subacute right ACA infarct he will require a stroke evaluation to include an echocardiogram, carotid Dopplers, and telemetry to assess for arrhythmias. If he has no arrhythmias during the hospitalization he may be a candidate for a loop recorder implantation. Although we will start with a TTE, the patchy nature of the right ACA infarct may suggest the need for a TEE to definitively rule out a cardiac embolic source. We will try to determine whether or not he is a Plavix failure. This may be difficult given that it is unclear if this is an acute or subacute infarct. For the time being, we will allow for some permissive hypertension. We will followup on the results of the EEG and if positive continue the [place]. If negative we will discontinue the Keppra. For his other chronic medical conditions we will continue his outpatient regimen. I anticipate he will require another 24-48 hours of inpatient evaluation before he will be ready for discharge.

## 2013-11-01 NOTE — Procedures (Signed)
History: 63 yo M with leg weakness  Sedation: None  Technique: This is a 17 channel routine scalp EEG performed at the bedside with bipolar and monopolar montages arranged in accordance to the international 10/20 system of electrode placement. One channel was dedicated to EKG recording.    Background: The background consists predominantly of alpha activities, though there is some mild slow delta range activity as well that is irregular and generalized There is a well defined posterior dominant rhythm of 8.5 Hz that attenuates with eye opening. The predominance of this EEG was performed during drowsiness and sleep, and the delta activity seen could be related to drowsiness.   Photic stimulation: Physiologic driving is not perfomed  EEG Abnormalities: 1) Mild generalized irregualr delta  Clinical Interpretation: This EEG is mostly performed during drowsiness and sleep, cannot exclude a mild encephalopathy but these findings could be due to ddrowsiness.  There was no seizure or seizure predisposition recorded on this study.   Roland Rack, MD [facility] [phone]  If 7pm- 7am, please page neurology on call as listed in [place].

## 2013-11-01 NOTE — Progress Notes (Signed)
PT Cancellation Note  Patient Details Name: [name] MRN: [id number] DOB: 11/11/1950   Cancelled Treatment:    Reason Eval/Treat Not Completed: Patient at procedure or test/unavailable. Pt off floor at testing at this time. Will re-attempt to see when pt is back on floor and medically ready.    Elie Confer [place], [place] 11/01/2013, 8:58 AM

## 2013-11-01 NOTE — Progress Notes (Signed)
EEG Completed; Results Pending  

## 2013-11-02 ENCOUNTER — Ambulatory Visit: Payer: Self-pay | Admitting: Internal Medicine

## 2013-11-02 DIAGNOSIS — I639 Cerebral infarction, unspecified: Secondary | ICD-10-CM | POA: Diagnosis present

## 2013-11-02 LAB — BASIC METABOLIC PANEL
BUN: 13 mg/dL (ref 6–23)
CALCIUM: 9.9 mg/dL (ref 8.4–10.5)
CO2: 24 meq/L (ref 19–32)
Chloride: 106 mEq/L (ref 96–112)
Creatinine, Ser: 0.79 mg/dL (ref 0.50–1.35)
GFR calc Af Amer: >90 mL/min (ref >90)
GLUCOSE: 90 mg/dL (ref 70–99)
Potassium: 3.3 mEq/L — ABNORMAL LOW (ref 3.7–5.3)
SODIUM: 141 meq/L (ref 137–147)

## 2013-11-02 MED ORDER — POTASSIUM CHLORIDE CRYS ER 20 MEQ PO TBCR
40.0000 meq | EXTENDED_RELEASE_TABLET | Freq: Once | ORAL | Status: AC
  Administered 2013-11-02: 40 meq via ORAL
  Filled 2013-11-02: qty 2

## 2013-11-02 MED ORDER — STROKE: EARLY STAGES OF RECOVERY BOOK
Freq: Once | Status: AC
  Administered 2013-11-02: 05:00:00
  Filled 2013-11-02: qty 1

## 2013-11-02 MED ORDER — NICOTINE 14 MG/24HR TD PT24
14.0000 mg | MEDICATED_PATCH | Freq: Every day | TRANSDERMAL | Status: DC
  Administered 2013-11-02 – 2013-11-03 (×2): 14 mg via TRANSDERMAL
  Filled 2013-11-02 (×2): qty 1

## 2013-11-02 NOTE — Progress Notes (Signed)
Physical medicine and rehabilitation consult requested with chart reviewed. Patient well known to rehabilitation services from CVA April 2014 of which he did receive inpatient rehabilitation services and again ultimately discharged to skilled nursing facility due to psychosocial issues. He was also recently seen by rehabilitation services 10/24/2013 for weakness with failure to thrive and again recommendations to the skilled nursing facility secondary to psychosocial dynamics. It is still recommendations to proceed with skilled nursing facility at this time. Will hold off rehabilitation consult this time with recommendations to skilled nursing facility

## 2013-11-02 NOTE — Progress Notes (Signed)
Dwaine Gale, PA-C Physician Assistant Incomplete Neurology Progress Notes Service date: 11/02/2013 12:00 PM     Stroke Team Progress Note   HISTORY  [name] is an 63 y.o. male with a history of hypertension, COPD, stroke and prostate cancer who was hospitalized within the past week for lower extremity weakness of unclear etiology. No specific cause for transient lower extremity weakness was found. Patient was subsequently discharged to home. He returned 10/31/2013 with a complaint of spontaneous involuntary twitching of his right lower extremity as well as inability to speak. Patient knew what he wanted to say and could understand others around him at the time. He had no symptoms involving his upper extremity. He stated that the speech changes were similar to changes he had at the onset of previous stroke. CT scan of his head showed no acute intracranial abnormality. He'd been taking Plavix for antiplatelet therapy. Twitching resolved and speech had returned to baseline when seen by Dr. Nicole Kindred in the emergency department.   SUBJECTIVE  Pt reports he had all of his teeth removed earlier this month. This may contribute to dysarthria. Otherwise seems back to baseline. Patient states he's been compliant with his medications. He has been on Plavix daily. He is allergic to aspirin.   OBJECTIVE  Most recent Vital Signs:  Filed Vitals:  11/01/13 1336 11/01/13 2000 11/02/13 0008 11/02/13 0400  BP: 132/78 139/73 149/74 163/96  Pulse: 84 73 64 74  Temp: 97.8 F (36.6 C) 98.5 F (36.9 C) 98.2 F (36.8 C) 97.6 F (36.4 C)  TempSrc: Oral Oral Oral Axillary  Resp: 18 18 18 18   Height:  Weight: 163 lb (73.936 kg)  SpO2: 100% 98% 100% 99%   CBG (last 3)  No results found for this basename: GLUCAP, in the last 72 hours  IV Fluid Intake:   MEDICATIONS  . atorvastatin 80 mg Oral q1800  . clopidogrel 75 mg Oral Q breakfast  . heparin 5,000 Units Subcutaneous 3 times per day  .  multivitamin with minerals 1 tablet Oral Daily  . nicotine 14 mg Transdermal Daily  . tiotropium 18 mcg Inhalation Daily  PRN: albuterol, senna-docusate   Diet: Bland Thin liquids  Activity: Bedrest  DVT Prophylaxis: Subcutaneous heparin   CLINICALLY SIGNIFICANT STUDIES  Basic Metabolic Panel:  Recent Labs  Lab 10/31/13  1952 11/02/13  0537  NA 143 141  K 3.9 3.3*  CL 105 106  CO2 -- 24  GLUCOSE 83 90  BUN 28* 13  CREATININE 1.30 0.79  CALCIUM -- 9.9  Liver Function Tests: No results found for this basename: AST, ALT, ALKPHOS, BILITOT, PROT, ALBUMIN, in the last 168 hours  CBC:  Recent Labs  Lab 10/31/13  1952  HGB 11.2*  HCT 33.0*  Coagulation: No results found for this basename: LABPROT, INR, in the last 168 hours  Cardiac Enzymes: No results found for this basename: CKTOTAL, CKMB, CKMBINDEX, TROPONINI, in the last 168 hours  Urinalysis: No results found for this basename: COLORURINE, APPERANCEUR, LABSPEC, PHURINE, GLUCOSEU, HGBUR, BILIRUBINUR, KETONESUR, PROTEINUR, UROBILINOGEN, NITRITE, LEUKOCYTESUR, in the last 168 hours  Lipid Panel  Component Value Date/Time  CHOL 95 11/01/2013 0935  TRIG 128 11/01/2013 0935  HDL 29* 11/01/2013 0935  CHOLHDL 3.3 11/01/2013 0935  VLDL 26 11/01/2013 0935  LDLCALC 40 11/01/2013 0935  HgbA1C  Lab Results  Component Value Date   HGBA1C 5.6 11/01/2013   Urine Drug Screen:  Component Value Date/Time  LABOPIA NONE DETECTED 10/25/2013 0410  LABOPIA  NEGATIVE 10/14/2010 2140  COCAINSCRNUR NONE DETECTED 10/25/2013 0410  COCAINSCRNUR Value: POSITIVE (NOTE) Result repeated and verified. Sent for confirmatory testing* 10/14/2010 2140  LABBENZ NONE DETECTED 10/25/2013 0410  LABBENZ NEGATIVE 10/14/2010 2140  AMPHETMU NONE DETECTED 10/25/2013 0410  AMPHETMU NEGATIVE 10/14/2010 2140  THCU NONE DETECTED 10/25/2013 0410  LABBARB NONE DETECTED 10/25/2013 0410   Alcohol Level: No results found for this basename: ETH, in the last 168 hours   Dg Chest 2 View   11/01/2013  1. Vague nodular opacities are suggested in the lungs. Recommend followup chest CT. 2. No focal consolidation to suggest pneumonia. No evidence of pulmonary edema.   Ct Head Wo Contrast  10/31/2013  1. No acute intracranial process identified. 2. Mild atrophy with moderate chronic microvascular ischemic disease and remote right cerebellar infarct, unchanged. 3. Paranasal sinus disease as above, similar relative to recent CT from 10/24/2013.   [name]  11/01/2013  Acute to subacute right ACA territory infarct affecting the medial frontal lobe greater than the medial parietal lobe. No hemorrhage or mass effect. 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 Generalized atrophy with extensive small vessel disease. No visible intracranial, calvarial or skull base lesions in this patient with prostate cancer.   2D Echocardiogram pending  Carotid Doppler pending   EKG - sinus rhythm rate 84 beats per minute. For complete results please see formal report.   Therapy Recommendations pending   Physical Exam  Mental Status:  Alert, oriented, thought content appropriate. Speech moderately dysarthric (edentulous) without evidence of aphasia. Able to follow commands without difficulty.  Cranial Nerves:  II-Visual fields were normal.  III/IV/VI-Pupils were equal and reacted. Extraocular movements were full and conjugate.  V/VII-no facial numbness and no facial weakness.  VIII-normal.  X-speech was moderately dysarthric but consistent with edentulous state; symmetrical palatal movement.  XII-midline tongue extension  Motor: Minimal hip flexor weakness, left greater than right, and unchanged from 10/26/2013; normal muscle tone throughout.  Sensory: Normal throughout.  Deep Tendon Reflexes: 1+ and symmetric.  Plantars: Flexor bilaterally   Cerebellar: Normal finger-to-nose testing.    ASSESSMENT  [name] is a 63 y.o. male presenting with inability to speak and twitching of his right lower extremity. TPA was not administered as the patient's deficits quickly resolved. MRI - acute to subacute right ACA territory infarct affecting the medial frontal lobe greater than the medial parietal lobe and a chronic right cerebellar infarct and chronic right ACA corpus callosum infarct. On clopidogrel 75 mg orally every day prior to admission. Now on clopidogrel 75 mg orally every day for secondary stroke prevention. Patient with resultant moderate dysarthria. Stroke work up underway.    Probable recent TIAs.  Positive UDS - cocaine   Hyperlipidemia - cholesterol 95 LDL 40 - Lipitor 80 mg daily prior to admission.   Hypokalemia   COPD history - ongoing tobacco use   Prostate cancer  Hypertension history  Previous CVA  Vague nodular opacities on chest x-ray - followup CT recommended  Aspirin allergy   Hospital day # 2   TREATMENT/PLAN   Continue clopidogrel 75 mg orally every day for secondary stroke prevention.   Await 2-D echo and carotid Dopplers  Per Dr. Leonie Man - check T. CTs to evaluate vasculature  Await therapy evaluations  Risk factor modification - substance abuse discussed  Rehabilitation evaluation - skilled nursing facility recommended  Mikey Bussing  PA-C  [facility]  Pager [phone]  11/02/2013, 10:35 AM   I have personally obtained a history, examined the patient, evaluated imaging results, and formulated the assessment and plan of care. I agree with the above.  Antony Contras, MD

## 2013-11-02 NOTE — Evaluation (Signed)
Occupational Therapy Evaluation Patient Details Name: [name] MRN: [id number] DOB: 04-Mar-1951 Today's Date: 11/02/2013    History of Present Illness [name] is a 63 year old man with a history of metastatic prostate cancer, previous CVA in April of 2014, hypertension, and chronic obstructive pulmonary disease who presents with the acute onset of bilateral lower extremity weakness and uncontrolled right lower extremity twitching and difficulty speaking. He has been discharged from the hospital twice in the past month after a total tooth extraction and subsequent weakness. The cause of his lower extremity weakness was not elucidated during the previous admission although he was found to have spinal stenosis secondary to degenerative changes with no cord compression. Patient reports the onset of right foot twitching that was involuntary. It progressed to involve most of the lower right extremity. At that time he also noted difficulty with speaking, particularly having a hard time producing words. He denied any headaches, visual changes, nausea, vomiting, shortness of breath, dizziness, palpitations, or other acute neurologic symptoms. He presented to the emergency department and was admitted to the internal medicine teaching service for further evaluation.   Clinical Impression   Pt admitted with above. Pt currently with functional limitations due to the deficits listed below (see OT Problem List). Pt will benefit from skilled OT to increase their safety and independence with ADL and functional mobility for ADL to facilitate discharge to venue listed below.       Follow Up Recommendations  Home health OT;Supervision/Assistance - 24 hour    Equipment Recommendations  Tub/shower bench    Precautions / Restrictions Precautions Precautions: Fall Restrictions Weight Bearing Restrictions: No      Mobility Bed Mobility Overal bed mobility: Modified Independent    Transfers Overall  transfer level: Needs assistance Equipment used: Rolling walker (2 wheeled) Transfers: Sit to/from [facility] Sit to Stand: Min assist Stand pivot transfers: Min assist       General transfer comment: assist for balance due to delayed movements of L LE    Balance Overall balance assessment: Needs assistance Sitting-balance support: Feet supported;No upper extremity supported Sitting balance-Leahy Scale: Good     Standing balance support: During functional activity Standing balance-Leahy Scale: Poor                      ADL   Grooming: Wash/dry hands;Wash/dry face;Standing;Supervision/safety         Toilet Transfer: Stand-pivot;Ambulation;Grab bars;RW;Minimal assistance;Regular Theatre manager and Hygiene: Min guard;Sit to/from stand Tub/ Banker: Walk-in shower;Moderate assistance;Ambulation;Rolling walker;3 in 1 Functional mobility during ADLs: Minimal assistance;Rolling walker;Cueing for safety       Vision Wears glasses for reading, no change from baseline per patient   Pertinent Vitals/Pain Denies pain     Hand Dominance Right   Extremity/Trunk Assessment Upper Extremity Assessment Upper Extremity Assessment: Generalized weakness           Communication Communication Communication: Expressive difficulties (dysarthria and reports all of his teeth were recently pulled.)   Cognition Arousal/Alertness: Awake/alert Behavior During Therapy: WFL for tasks assessed/performed Overall Cognitive Status: No family/caregiver present to determine baseline cognitive functioning Area of Impairment: Safety/judgement;Awareness;Problem solving       Following Commands: Follows one step commands consistently;Follows multi-step commands inconsistently Safety/Judgement: Decreased awareness of safety   Problem Solving: Slow processing;Difficulty sequencing;Requires verbal cues;Requires tactile cues       Home  Living Family/patient expects to be discharged to:: Private residence Living Arrangements: Non-relatives/Friends Available Help at Discharge: Friend(s);Available PRN/intermittently;Personal  care attendant (CNA 3X/wk to assist with bath, cooking, laundry, grocery shopping per patient) Type of Home: House Home Access: Stairs to enter [employer] of Steps: 1 Entrance Stairs-Rails: None       Bathroom Shower/Tub: Tub/shower unit;Curtain Shower/tub characteristics: Architectural technologist: Standard Bathroom Accessibility: Yes   Home Equipment: Environmental consultant - 2 wheels;Cane - single point;Shower seat   Additional Comments: Patient reports he uses sink on right to assist with sit><stand on toilet.       Prior Functioning/Environment Level of Independence: Needs assistance  Gait / Transfers Assistance Needed: uses AD for ambulation ADL's / Homemaking Assistance Needed: "Aid comes in 3 times/wk to assist with cooking, cleaning and helps with getting in shower"        OT Diagnosis: Generalized weakness and decreased safety awareness and decreased balance  OT Problem List: Decreased strength;Impaired balance (sitting and/or standing);Decreased safety awareness;Decreased knowledge of use of DME or AE;Pain;Decreased activity tolerance;Decreased cognition;Decreased coordination   OT Treatment/Interventions: Self-care/ADL training;DME and/or AE instruction;Therapeutic activities;Patient/family education;Balance training;Cognitive remediation/compensation    OT Goals(Current goals can be found in the care plan section) Acute Rehab OT Goals Patient Stated Goal:  and go home OT Goal Formulation: With patient Time For Goal Achievement: 11/16/13 Potential to Achieve Goals: Good  OT Frequency: Min 2X/week   Barriers to D/C: Decreased caregiver support  does not have 24/7       End of Session: Equipment Utilized During Treatment: Rolling walker  Activity Tolerance: Patient tolerated  treatment well Patient left: in chair;with call bell/phone within reach   Time: 0109-3235 OT Time Calculation (min): 28 min Charges:  OT General Charges $OT Visit: 1 Procedure OT Evaluation $Initial OT Evaluation Tier I: 1 Procedure OT Treatments $Self Care/Home Management : 8-22 mins G-Codes:    [name] 2013-11-04, 4:23 PM

## 2013-11-02 NOTE — Progress Notes (Signed)
  Echocardiogram 2D Echocardiogram has been performed.  [name] 11/02/2013, 12:13 PM

## 2013-11-02 NOTE — Evaluation (Signed)
Physical Therapy Evaluation Patient Details Name: [name] MRN: [id number] DOB: [birth year] Today's Date: 11/02/2013   History of Present Illness  Mr. Froning is a 63 year old man with a history of metastatic prostate cancer, previous CVA in April of 2014, hypertension, and chronic obstructive pulmonary disease who presents with the acute onset of bilateral lower extremity weakness and uncontrolled right lower extremity twitching and difficulty speaking. He has been discharged from the hospital twice in the past month after a total tooth extraction and subsequent weakness. The cause of his lower extremity weakness was not elucidated during the previous admission although he was found to have spinal stenosis secondary to degenerative changes with no cord compression. At home yesterday, he was watching television when he noted the onset of right foot twitching that was involuntary. It progressed to involve most of the lower right extremity. At that time he also noted difficulty with speaking, particularly having a hard time producing words. He denied any headaches, visual changes, nausea, vomiting, shortness of breath, dizziness, palpitations, or other acute neurologic symptoms. He presented to the emergency department and was admitted to the internal medicine teaching service for further evaluation.  Clinical Impression  Pt presents with impaired balance, gait and mobility and will benefit from skilled PT services to address deficits and increase functional independence.    Follow Up Recommendations CIR;Supervision/Assistance - 24 hour    Equipment Recommendations   (pt states he has RW)    Recommendations for Other Services Rehab consult     Precautions / Restrictions Precautions Precautions: Fall Restrictions Weight Bearing Restrictions: No      Mobility  Bed Mobility Overal bed mobility: Modified Independent                Transfers Overall transfer level: Needs  assistance Equipment used: Rolling walker (2 wheeled)   Sit to Stand: Min assist Stand pivot transfers: Min assist       General transfer comment: assist for balance due to delayed movements of L LE  Ambulation/Gait Ambulation/Gait assistance: Min assist Ambulation Distance (Feet): 75 Feet Assistive device: Rolling walker (2 wheeled)     Gait velocity interpretation: <1.8 ft/sec, indicative of risk for recurrent falls General Gait Details: L LE with knee flexion throughout, antalgic gait on L , decreased L foot clearance during swing  Stairs            Wheelchair Mobility    Modified Rankin (Stroke Patients Only)       Balance             Standing balance-Leahy Scale: Poor                       Pertinent Vitals/Pain No c/o pain    Home Living Family/patient expects to be discharged to:: Private residence Living Arrangements: Non-relatives/Friends Available Help at Discharge: Friend(s);Available PRN/intermittently Type of Home: House Home Access: Stairs to enter Entrance Stairs-Rails: None Entrance Stairs-Number of Steps: 1   Home Equipment: [place] - 2 wheels;Cane - single point      Prior Function Level of Independence: Independent with assistive device(s)               Hand Dominance        Extremity/Trunk Assessment                   LLE Deficits / Details: R LE 4/5, L LE 3/5, L inattentoin  Cervical / Trunk Assessment: Normal  Communication   Communication: Expressive  difficulties (dysarthria)  Cognition Arousal/Alertness: Awake/alert Behavior During Therapy: WFL for tasks assessed/performed           Following Commands: Follows one step commands consistently     Problem Solving: Slow processing      General Comments      Exercises        Assessment/Plan    PT Assessment Patient needs continued PT services  PT Diagnosis Difficulty walking;Abnormality of gait;Generalized weakness   PT Problem  List Decreased activity tolerance;Decreased balance;Decreased knowledge of use of DME;Decreased safety awareness;Decreased mobility;Decreased strength;Decreased coordination  PT Treatment Interventions DME instruction;Therapeutic exercise;Wheelchair mobility training;Balance training;Gait training;Stair training;Neuromuscular re-education;Functional mobility training;Cognitive remediation;Modalities;Therapeutic activities;Patient/family education   PT Goals (Current goals can be found in the Care Plan section) Acute Rehab PT Goals Patient Stated Goal: get better PT Goal Formulation: With patient Time For Goal Achievement: 11/09/13 Potential to Achieve Goals: Good    Frequency Min 4X/week   Barriers to discharge Decreased caregiver support lives with friends, no 24 hour care    End of Session   Activity Tolerance: Patient tolerated treatment well Patient left: in bed;with bed alarm set;with call bell/phone within reach         Time: 0740-0758 PT Time Calculation (min): 18 min   Charges:   PT Evaluation $Initial PT Evaluation Tier I: 1 Procedure     PT G Codes:          Seleny Allbright 11/02/2013, 9:25 AM

## 2013-11-02 NOTE — Progress Notes (Signed)
Subjective: [name] is doing well this morning, no complaints other than requesting nicotine patch.  Eating well, states he worked with PT this morning.   Stroke team to see patient today.   Objective: Vital signs in last 24 hours: Filed Vitals:   11/01/13 1336 11/01/13 2000 11/02/13 0008 11/02/13 0400  BP: 132/78 139/73 149/74 163/96  Pulse: 84 73 64 74  Temp: 97.8 F (36.6 C) 98.5 F (36.9 C) 98.2 F (36.8 C) 97.6 F (36.4 C)  TempSrc: Oral Oral Oral Axillary  Resp: 18 18 18 18   Height:      Weight:    163 lb (73.936 kg)  SpO2: 100% 98% 100% 99%   Weight change: -21 lb (-9.526 kg)  Intake/Output Summary (Last 24 hours) at 11/02/13 0350 Last data filed at 11/02/13 0800  Gross per 24 hour  Intake    320 ml  Output    603 ml  Net   -283 ml   PEX General: alert, cooperative, and in no apparent distress HEENT: NCAT, vision grossly intact, mild oral swelling (improved since last admission) Neck: supple, no lymphadenopathy Lungs: clear to ascultation bilaterally, normal work of respiration, no wheezes, rales, ronchi Heart: regular rate and rhythm, no murmurs, gallops, or rubs Abdomen: soft, non-tender, non-distended, normal bowel sounds Extremities: 2+ DP/PT pulses bilaterally, no cyanosis, clubbing, or edema Neurologic: alert & oriented X3, cranial nerves II-XII intact, sensation intact to light touch, strength 4+/5 in BLE  Lab Results: Basic Metabolic Panel:  Recent Labs Lab 10/31/13 1952 11/02/13 0537  NA 143 141  K 3.9 3.3*  CL 105 106  CO2  --  24  GLUCOSE 83 90  BUN 28* 13  CREATININE 1.30 0.79  CALCIUM  --  9.9   CBC:  Recent Labs Lab 10/31/13 1952  HGB 11.2*  HCT 33.0*    Micro Results: Recent Results (from the past 240 hour(s))  CULTURE, BLOOD (ROUTINE X 2)     Status: None   Collection Time    10/24/13  6:45 PM      Result Value Ref Range Status   Specimen Description BLOOD LEFT ARM   Final   Special Requests BOTTLES DRAWN AEROBIC  AND ANAEROBIC 5CC   Final   Culture  Setup Time     Final   Value: 10/25/2013 01:02     Performed at [facility]   Culture     Final   Value: NO GROWTH 5 DAYS     Performed at [facility]   Report Status 10/31/2013 FINAL   Final  CULTURE, BLOOD (ROUTINE X 2)     Status: None   Collection Time    10/24/13  7:17 PM      Result Value Ref Range Status   Specimen Description BLOOD RIGHT FOREARM   Final   Special Requests BOTTLES DRAWN AEROBIC AND ANAEROBIC 5CC   Final   Culture  Setup Time     Final   Value: 10/25/2013 01:04     Performed at [facility]     Final   Value: NO GROWTH 5 DAYS     Performed at [facility]   Report Status 10/31/2013 FINAL   Final  URINE CULTURE     Status: None   Collection Time    10/25/13  4:10 AM      Result Value Ref Range Status   Specimen Description URINE, CLEAN CATCH   Final   Special  Requests Normal   Final   Culture  Setup Time     Final   Value: 10/25/2013 08:05     Performed at [place]     Final   Value: >=100,000 COLONIES/ML     Performed at [facility]   Culture     Final   Value: Multiple bacterial morphotypes present, none predominant. Suggest appropriate recollection if clinically indicated.     Performed at [facility]   Report Status 10/26/2013 FINAL   Final  GRAM STAIN     Status: None   Collection Time    10/25/13  4:11 AM      Result Value Ref Range Status   Specimen Description URINE, CLEAN CATCH   Final   Special Requests Normal   Final   Gram Stain     Final   Value: CYTOSPUN     WBC PRESENT,BOTH PMN AND MONONUCLEAR     Multiple bacterial morphotypes present, none predominant. Suggest appropriate recollection if clinically indicated.     YEAST     Results Called toZelphia Cairo RN [phone] Perlie Mayo   Report Status 10/25/2013 FINAL   Final   Studies/Results: Dg Chest 2 View  11/01/2013   CLINICAL DATA:  Stroke.  Weakness.   Hypertension.  EXAM: CHEST  2 VIEW  COMPARISON:  10/24/2013  FINDINGS: Cardiac silhouette is normal in size and configuration. The aorta is mildly uncoiled. No mediastinal or hilar masses.  Lungs show vague nodular opacities most evident near the right apex. There is no lung consolidation to suggest pneumonia. No pulmonary edema. No pleural effusion or pneumothorax.  Bony thorax is demineralized.  IMPRESSION: 1. Vague nodular opacities are suggested in the lungs. Recommend followup chest CT. 2. No focal consolidation to suggest pneumonia. No evidence of pulmonary edema.   Electronically Signed   By: Lajean Manes M.D.   On: 11/01/2013 09:14   Ct Head Wo Contrast  10/31/2013   CLINICAL DATA:  Left-sided weakness  EXAM: CT HEAD WITHOUT CONTRAST  TECHNIQUE: Contiguous axial images were obtained from the base of the skull through the vertex without intravenous contrast.  COMPARISON:  Prior CT from 10/24/2013  FINDINGS: Encephalomalacia within the right cerebellar hemisphere again seen, Stable relative to prior exam. Mild atrophy with moderate chronic microvascular ischemic changes are also stable. Prominent atherosclerotic calcifications noted within the carotid siphons and distal vertebral arteries.  There is no acute intracranial hemorrhage or large vessel territory infarct. No mass lesion or midline shift. Gray-white matter differentiation is well maintained. Ventricles are normal in size without evidence of hydrocephalus. No extra-axial fluid collection.  The calvarium is intact.  Orbital soft tissues are within normal limits.  There is complete opacification of the left maxillary sinus with mild polypoid opacity within the right maxillary sinus. Minimal opacity seen within the inferior right frontal sinus. No mastoid effusion.  Scalp soft tissues are unremarkable.  IMPRESSION: 1. No acute intracranial process identified. 2. Mild atrophy with moderate chronic microvascular ischemic disease and remote right  cerebellar infarct, unchanged. 3. Paranasal sinus disease as above, similar relative to recent CT from 10/24/2013.   Electronically Signed   By: Jeannine Boga M.D.   On: 10/31/2013 18:50   [name]  11/01/2013   CLINICAL DATA:  Stroke. Prostate cancer. Right leg twitching. Inability to speak.  EXAM: MRI HEAD WITHOUT CONTRAST  TECHNIQUE: Multiplanar, multiecho pulse sequences of the brain and surrounding structures were obtained  without intravenous contrast.  COMPARISON:  CT HEAD W/O CM dated 10/31/2013; MR HEAD W/O CM dated 12/05/2012; CT HEAD W/O CM dated 10/24/2013  FINDINGS: The patient was unable to remain motionless for the exam. Small or subtle lesions could be overlooked.  Patchy and variably hyperintense areas of restricted diffusion are seen within the medial right frontal lobe and medial right parietal lobe consistent with an acute to subacute right ACA territory infarct. On the previous MR from 2014, a more proximal right ACA infarct affected the genu of the corpus callosum. Pattern of ischemia today is more distal and more superior. No hemorrhage is present.  Chronic right cerebellar infarct. Generalized atrophy. Extensive small vessel disease throughout the periventricular and subcortical white matter. Flow voids are maintained in the carotid, basilar, and vertebral arteries. Proximally, both anterior cerebral arteries appear patent although patency of the distal right ACA in the interhemispheric fissure cannot clearly be established.  Tiny focus chronic hemorrhage left cerebellum and left external capsule where there is a remote hemorrhage/lacunar infarct.  No calvarial or skull base lesions. No visible dural tumor deposits on this noncontrast exam. Unremarkable pituitary and cerebellar tonsils. Complete or near-complete filling of the left maxillary sinus with fluid or inflammatory tissue.  IMPRESSION: Acute to subacute right ACA territory infarct affecting the medial frontal lobe  greater than the medial parietal lobe. No hemorrhage or mass effect.  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 Generalized atrophy with extensive small vessel disease.  No visible intracranial, calvarial or skull base lesions in this patient with prostate cancer.   Electronically Signed   By: Rolla Flatten M.D.   On: 11/01/2013 09:11   Medications: I have reviewed the patient's current medications. Scheduled Meds: . atorvastatin  80 mg Oral q1800  . clopidogrel  75 mg Oral Q breakfast  . heparin  5,000 Units Subcutaneous 3 times per day  . multivitamin with minerals  1 tablet Oral Daily  . potassium chloride  40 mEq Oral Once  . tiotropium  18 mcg Inhalation Daily   Continuous Infusions:  PRN Meds:.albuterol, senna-docusate Assessment/Plan: #Stroke- Patient presented with RLE spontaneous and involuntary movements at rest as well as speech difficulty, both of which had resolved by the time of admission.  No other neurologic symptoms including loss of consciousness, incontinence, decreased strength or sensory abnormalities.  Patient has history of two previous strokes, most recent in 11/2012 with residual left leg weakness; on Plavix 75 mg daily at home (ASA allergy).  Of note, patient was off Plavix for period of time prior to oral surgery last week (restarted 3/17 per dentistry recs).  CT head on admission showed no acute intracranial abnormalities.  However, MRI showed acute to subacute right ACA infarct affecting medial frontal lobe (no hemorrhage or mass effect) thus proceeding with stroke workup.  Bilateral carotid dopplers negative.  Risk stratification: A1C 5.6%, LDL 40.  EEG negative for seizure. -neurology consult, appreciate recs -PT recommending CIR who has ben consulted; social work also aware -echo today -continue Plavix 75 mg daily, Lipitor 80 mg  daily   -discontinue Keppra per neurology recs -neuro checks q4h  #HTN- On HCTZ 25 mg daily and Lisinopril 10 mg daily at home. BP elevated to 409W systolic but holding anti-hypertensives for permissive HTN x 48 hours.   -restart HCTZ and lisinopril tomorrow  #Mild AKI- Admission Cr 1.30, baseline ~1.0.  Patient given NS  at 75 cc/hr x 8 hours.  Now eating and drinking well.  -BMP in AM   #COPD- Stable, lungs CTAB, no concern for acute exacerbation at this time.  -continue Spiriva daily, Albuterol prn   #Prostate Cancer- Patient following with Dr. Janice Norrie, see above. Per patient, last PSA <1. Follow-up outpatient.  Patient will likely need follow up bone scan on outpatient.   #Bed Bugs- Patient found to have bed bugs at previous admission.  -contact isolation   Dispo: Disposition is deferred at this time, awaiting improvement of current medical problems.  Anticipated discharge in approximately 1-2 day(s).    The patient does have a current PCP Corky Sox, MD) and does need an [facility] hospital follow-up appointment after discharge.   .Services Needed at time of discharge: Y = Yes, Blank = No PT:   OT:   RN:   Equipment:   Other:     LOS: 2 days   Ivin Poot, MD 11/02/2013, 9:21 AM

## 2013-11-02 NOTE — Progress Notes (Signed)
Clinical Social Work Department BRIEF PSYCHOSOCIAL ASSESSMENT 11/02/2013  Patient:  [name], [name]     Account Number:  [id number]     Admit date:  10/31/2013  Clinical Social Worker:  Adair Laundry  Date/Time:  11/02/2013 02:00 PM  Referred by:  Physician  Date Referred:  11/02/2013 Referred for  Psychosocial assessment   Other Referral:   Interview type:  Patient Other interview type:    PSYCHOSOCIAL DATA Living Status:  FRIEND(S) Admitted from facility:   Level of care:   Primary support name:  Peggye Pitt [phone] Primary support relationship to patient:  FRIEND Degree of support available:   Pt reports having adequate support    CURRENT CONCERNS Current Concerns  Post-Acute Placement   Other Concerns:    SOCIAL WORK ASSESSMENT / PLAN CSW received call from MD informing about prior enounters with pt and possible discharge barriers this admission. CSW aware that PT is recommending CIR but CIR believe pt is not appropriate at this time. CSW spoke with pt about home living situation. Pt reports living with his friend. CSW asked if pt had any family in the area, pt denies having a daughter (which was previously documented) but informed CSW his cousin lives just 5-6 blocks away from him. CSW discussed dc options and recommendations with pt. CSW not sure if pt was able to fully comprehend discussion but CSW did go over all options with pt multiple times (SNF, ALF, HH). Pt informed CSW he would be agreeable to rehab but not in a nursing home. CSW explained that unfortunately this is not an option. CSW spoke to pt again about ALF. Pt informed CSW this still sounds like a nursing home and not what he wants. CSW understanding and explored home option with pt. CSW spoke with pt about hospital staff wanting to ensure pt has resources at home so he does not feel that he need to be in the hospital to get help. Pt was appreciative of this and informed CSW he is open with HH. CSW asked  if pt cousin could potentially check in on pt more frequently. Pt said this would be fine. CSW to speak to pt again on day of dc and obtain pt cousin phone number so CSW can also ask her to check in on pt.   CSW relayed this conversation to RN CM who will assist with resuming HH.   Assessment/plan status:  Psychosocial Support/Ongoing Assessment of Needs Other assessment/ plan:   Information/referral to community resources:   SNF and ALF list denied    PATIENT'S/FAMILY'S RESPONSE TO PLAN OF CARE: Pt wanting to dc home with [facility] services       Sonterra Shelby, [place]

## 2013-11-02 NOTE — Discharge Summary (Signed)
INTERNAL MEDICINE ATTENDING DISCHARGE COSIGN   I evaluated the patient on the day of discharge and discussed the discharge plan with my resident team. I agree with the discharge documentation and disposition.   Madilyn Fireman 11/02/2013, 3:14 PM

## 2013-11-02 NOTE — Progress Notes (Signed)
    Day 2 of stay      Patient name: [name]  Medical record number: [id number]  Date of birth: 07-06-1951  [name] is back to my team, this time with complaints of leg twitching and is being worked up for seizure/stroke. He was found to have an acute/subacute medial right frontal lobe and medial right parietal lobe infarct consistent with right ACA territory blockage. He has old right cerebeller and corpus callosum genu infarcts. On exam, I do not see any neurological deficits that are different from prior exams. He is being kept on Keppra, with stroke work up being followed. An EEG done yesterday observed no seizure predisposition.   I have seen and evaluated this patient with my resident team and I have read Dr Stann Mainland note from today, I have discussed this case with her and Dr Algis Liming and I agree with their documentation.  [place], [place] 11/02/2013, 1:15 PM.

## 2013-11-02 NOTE — Progress Notes (Signed)
Transcranial doppler order should NOT be ordered through Ultrasound; Vascular does these and the order is different. Candace from vascular asked that we d/c the original order and place the correct one so that it could be done.

## 2013-11-02 NOTE — Progress Notes (Deleted)
Stroke Team Progress Note  HISTORY [name] is an 63 y.o. male with a history of hypertension, COPD, stroke and prostate cancer who was hospitalized within the past week for lower extremity weakness of unclear etiology. No specific cause for transient lower extremity weakness was found. Patient was subsequently discharged to home. He returned 10/31/2013 with a complaint of spontaneous involuntary twitching of his right lower extremity as well as inability to speak. Patient knew what he wanted to say and could understand others around him at the time. He had no symptoms involving his upper extremity. He stated that the speech changes were similar to changes he had at the onset of previous stroke. CT scan of his head showed no acute intracranial abnormality. He'd been taking Plavix for antiplatelet therapy. Twitching resolved and speech had returned to baseline when seen by Dr. Nicole Kindred in the emergency department.  SUBJECTIVE Pt reports he had all of his teeth removed earlier this month. This may contribute to dysarthria. Otherwise seems back to baseline. Patient states he's been compliant with his medications. He has been on Plavix daily. He is allergic to aspirin.  OBJECTIVE Most recent Vital Signs: Filed Vitals:   11/01/13 1336 11/01/13 2000 11/02/13 0008 11/02/13 0400  BP: 132/78 139/73 149/74 163/96  Pulse: 84 73 64 74  Temp: 97.8 F (36.6 C) 98.5 F (36.9 C) 98.2 F (36.8 C) 97.6 F (36.4 C)  TempSrc: Oral Oral Oral Axillary  Resp: 18 18 18 18   Height:      Weight:    163 lb (73.936 kg)  SpO2: 100% 98% 100% 99%   CBG (last 3)  No results found for this basename: GLUCAP,  in the last 72 hours  IV Fluid Intake:     MEDICATIONS  . atorvastatin  80 mg Oral q1800  . clopidogrel  75 mg Oral Q breakfast  . heparin  5,000 Units Subcutaneous 3 times per day  . multivitamin with minerals  1 tablet Oral Daily  . nicotine  14 mg Transdermal Daily  . tiotropium  18 mcg Inhalation  Daily   PRN:  albuterol, senna-docusate  Diet:  Bland  Thin liquids Activity:  Bedrest DVT Prophylaxis:  Subcutaneous heparin  CLINICALLY SIGNIFICANT STUDIES Basic Metabolic Panel:  Recent Labs Lab 10/31/13 1952 11/02/13 0537  NA 143 141  K 3.9 3.3*  CL 105 106  CO2  --  24  GLUCOSE 83 90  BUN 28* 13  CREATININE 1.30 0.79  CALCIUM  --  9.9   Liver Function Tests: No results found for this basename: AST, ALT, ALKPHOS, BILITOT, PROT, ALBUMIN,  in the last 168 hours CBC:  Recent Labs Lab 10/31/13 1952  HGB 11.2*  HCT 33.0*   Coagulation: No results found for this basename: LABPROT, INR,  in the last 168 hours Cardiac Enzymes: No results found for this basename: CKTOTAL, CKMB, CKMBINDEX, TROPONINI,  in the last 168 hours Urinalysis: No results found for this basename: COLORURINE, APPERANCEUR, LABSPEC, PHURINE, GLUCOSEU, HGBUR, BILIRUBINUR, KETONESUR, PROTEINUR, UROBILINOGEN, NITRITE, LEUKOCYTESUR,  in the last 168 hours Lipid Panel    Component Value Date/Time   CHOL 95 11/01/2013 0935   TRIG 128 11/01/2013 0935   HDL 29* 11/01/2013 0935   CHOLHDL 3.3 11/01/2013 0935   VLDL 26 11/01/2013 0935   LDLCALC 40 11/01/2013 0935   HgbA1C  Lab Results  Component Value Date   HGBA1C 5.6 11/01/2013    Urine Drug Screen:     Component Value Date/Time  LABOPIA NONE DETECTED 10/25/2013 0410   LABOPIA NEGATIVE 10/14/2010 2140   COCAINSCRNUR NONE DETECTED 10/25/2013 0410   COCAINSCRNUR  Value: POSITIVE (NOTE) Result repeated and verified. Sent for confirmatory testing* 10/14/2010 2140   LABBENZ NONE DETECTED 10/25/2013 0410   LABBENZ NEGATIVE 10/14/2010 2140   AMPHETMU NONE DETECTED 10/25/2013 0410   AMPHETMU NEGATIVE 10/14/2010 2140   THCU NONE DETECTED 10/25/2013 0410   LABBARB NONE DETECTED 10/25/2013 0410    Alcohol Level: No results found for this basename: ETH,  in the last 168 hours  Dg Chest 2 View 11/01/2013    1. Vague nodular opacities are suggested in the lungs. Recommend  followup chest CT. 2. No focal consolidation to suggest pneumonia. No evidence of pulmonary edema.     Ct Head Wo Contrast 10/31/2013    1. No acute intracranial process identified. 2. Mild atrophy with moderate chronic microvascular ischemic disease and remote right cerebellar infarct, unchanged. 3. Paranasal sinus disease as above, similar relative to recent CT from 10/24/2013.      [name] 11/01/2013    Acute to subacute right ACA territory infarct affecting the medial frontal lobe greater than the medial parietal lobe. No hemorrhage or mass effect.  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 Generalized atrophy with extensive small vessel disease.  No visible intracranial, calvarial or skull base lesions in this patient with prostate cancer.       2D Echocardiogram  pending  Carotid Doppler  pending   EKG - sinus rhythm rate 84 beats per minute. For complete results please see formal report.   Therapy Recommendations pending  Physical Exam   Mental Status:  Alert, oriented, thought content appropriate. Speech moderately dysarthric (edentulous) without evidence of aphasia. Able to follow commands without difficulty.  Cranial Nerves:  II-Visual fields were normal.  III/IV/VI-Pupils were equal and reacted. Extraocular movements were full and conjugate.  V/VII-no facial numbness and no facial weakness.  VIII-normal.  X-speech was moderately dysarthric but consistent with edentulous state; symmetrical palatal movement.  XII-midline tongue extension  Motor: Minimal hip flexor weakness, left greater than right, and unchanged from 10/26/2013; normal muscle tone throughout.  Sensory: Normal throughout.  Deep Tendon Reflexes: 1+ and symmetric.  Plantars: Flexor bilaterally  Cerebellar: Normal finger-to-nose testing.   ASSESSMENT Mr.  [name] is a 63 y.o. male presenting with inability to speak and twitching of his right lower extremity. TPA was not administered as the patient's deficits quickly resolved. MRI - acute to subacute right ACA territory infarct affecting the medial frontal lobe greater than the medial parietal lobe and a chronic right cerebellar infarct and chronic right ACA corpus callosum infarct.  On clopidogrel 75 mg orally every day prior to admission. Now on clopidogrel 75 mg orally every day for secondary stroke prevention. Patient with resultant moderate dysarthria. Stroke work up underway. Probable recent TIAs.   Positive UDS - cocaine  Hyperlipidemia - cholesterol 95 LDL 40 - Lipitor 80 mg daily prior to admission.  Hypokalemia  COPD history - ongoing tobacco use  Prostate cancer  Hypertension history  Previous stroke  Vague nodular opacities on chest x-ray - followup CT recommended.  Aspirin allergy   Hospital day # 2  TREATMENT/PLAN  Continue clopidogrel 75 mg orally every day for secondary stroke prevention.  Await 2-D echo and carotid Dopplers  Per Dr Leonie Man - check TCDs to evaluate vasculature  Await therapy evaluations  Risk factor modification - substance abuse discussed.  Rehabilitation evaluation - skilled nursing facility placement recommended.   Mikey Bussing PA-C [facility] Pager [phone] 11/02/2013, 10:35 AM  I have personally obtained a history, examined the patient, evaluated imaging results, and formulated the assessment and plan of care. I agree with the above.    To contact Stroke Continuity provider, please refer to [email or web address] After hours, contact General Neurology

## 2013-11-03 LAB — BASIC METABOLIC PANEL
BUN: 11 mg/dL (ref 6–23)
CO2: 21 mEq/L (ref 19–32)
CREATININE: 0.83 mg/dL (ref 0.50–1.35)
Calcium: 10 mg/dL (ref 8.4–10.5)
Chloride: 104 mEq/L (ref 96–112)
GFR calc Af Amer: >90 mL/min (ref >90)
Glucose, Bld: 93 mg/dL (ref 70–99)
Potassium: 4.1 mEq/L (ref 3.7–5.3)
Sodium: 140 mEq/L (ref 137–147)

## 2013-11-03 MED ORDER — LISINOPRIL 10 MG PO TABS
10.0000 mg | ORAL_TABLET | Freq: Every day | ORAL | Status: DC
  Administered 2013-11-03: 10 mg via ORAL
  Filled 2013-11-03: qty 1

## 2013-11-03 MED ORDER — HYDROCHLOROTHIAZIDE 25 MG PO TABS
25.0000 mg | ORAL_TABLET | Freq: Every day | ORAL | Status: DC
  Administered 2013-11-03: 25 mg via ORAL
  Filled 2013-11-03: qty 1

## 2013-11-03 NOTE — Discharge Instructions (Signed)
Please take your medicines as prescribed.  Our care manager has arranged for a [facility] to come to your home and be sure you have the resources you need.  Don't forget your follow-up appointment in [facility] next week.   STROKE/TIA DISCHARGE INSTRUCTIONS SMOKING Cigarette smoking nearly doubles your risk of having a stroke & is the single most alterable risk factor  If you smoke or have smoked in the last 12 months, you are advised to quit smoking for your health.  Most of the excess cardiovascular risk related to smoking disappears within a year of stopping.  Ask you doctor about anti-smoking medications  [place] Quit Line: 1-800-QUIT NOW  Free Smoking Cessation Classes [phone]  CHOLESTEROL Know your levels; limit fat & cholesterol in your diet  Lipid Panel     Component Value Date/Time   CHOL 95 11/01/2013 0935   TRIG 128 11/01/2013 0935   HDL 29* 11/01/2013 0935   CHOLHDL 3.3 11/01/2013 0935   VLDL 26 11/01/2013 0935   LDLCALC 40 11/01/2013 0935      Many patients benefit from treatment even if their cholesterol is at goal.  Goal: Total Cholesterol (CHOL) less than 160  Goal:  Triglycerides (TRIG) less than 150  Goal:  HDL greater than 40  Goal:  LDL (LDLCALC) less than 100   BLOOD PRESSURE American Stroke Association blood pressure target is less that 120/80 mm/Hg  Your discharge blood pressure is:  BP: 173/87 mmHg  Monitor your blood pressure  Limit your salt and alcohol intake  Many individuals will require more than one medication for high blood pressure  DIABETES (A1c is a blood sugar average for last 3 months) Goal HGBA1c is under 7% (HBGA1c is blood sugar average for last 3 months)  Diabetes: Diagnosis of diabetes:  Your A1c:15.6 %    Lab Results  Component Value Date   HGBA1C 5.6 11/01/2013     Your HGBA1c can be lowered with medications, healthy diet, and exercise.  Check your blood sugar as directed by your physician  Call your physician if you experience  unexplained or low blood sugars.  PHYSICAL ACTIVITY/REHABILITATION Goal is 30 minutes at least 4 days per week  Activity: Increase activity slowly, Therapies: Physical Therapy: Home Health Return to work:   Activity decreases your risk of heart attack and stroke and makes your heart stronger.  It helps control your weight and blood pressure; helps you relax and can improve your mood.  Participate in a regular exercise program.  Talk with your doctor about the best form of exercise for you (dancing, walking, swimming, cycling).  DIET/WEIGHT Goal is to maintain a healthy weight  Your discharge diet is: Bland  liquids Your height is:  Height: 5\' 8"  (172.7 cm) Your current weight is: Weight: 73.936 kg (163 lb) Your Body Mass Index (BMI) is:  BMI (Calculated): 24.1  Following the type of diet specifically designed for you will help prevent another stroke.  Your goal weight range is:    Your goal Body Mass Index (BMI) is 19-24.  Healthy food habits can help reduce 3 risk factors for stroke:  High cholesterol, hypertension, and excess weight.  RESOURCES Stroke/Support Group:  Call [phone]   STROKE EDUCATION PROVIDED/REVIEWED AND GIVEN TO PATIENT Stroke warning signs and symptoms How to activate emergency medical system (call 911). Medications prescribed at discharge. Need for follow-up after discharge. Personal risk factors for stroke. Pneumonia vaccine given: No Flu vaccine given: No My questions have been answered, the  writing is legible, and I understand these instructions.  I will adhere to these goals & educational materials that have been provided to me after my discharge from the hospital.

## 2013-11-03 NOTE — Progress Notes (Signed)
VASCULAR LAB PRELIMINARY  PRELIMINARY  PRELIMINARY  PRELIMINARY  Transcranial Doppler  Date POD PCO2 HCT BP  MCA ACA PCA OPHT SIPH VERT Basilar  3-27- 15 SB     Right  Left   --  --   --  --   --  --   17  18   18   --   -32  -28     -54         Right  Left                                            Right  Left                                             Right  Left                                             Right  Left                                            Right  Left                                            Right  Left                                        MCA = Middle Cerebral Artery      OPHT = Opthalmic Artery     BASILAR = Basilar Artery   ACA = Anterior Cerebral Artery     SIPH = Carotid Siphon PCA = Posterior Cerebral Artery   VERT = Verterbral Artery                   Normal MCA = 62+\-12 ACA = 50+\-12 PCA = 42+\-23         Sarahjane Matherly, RVT 11/03/2013, 10:14 AM

## 2013-11-03 NOTE — Care Management Note (Signed)
    Page 1 of 2   11/03/2013     12:31:19 PM   CARE MANAGEMENT NOTE 11/03/2013  Patient:  [name], [name]   Account Number:  [id number]  Date Initiated:  11/03/2013  Documentation initiated by:  GRAVES-BIGELOW,Sinaya [place]  Subjective/Objective Assessment:   Pt admitted for + stroke. Pt was previously active with [facility] with PT, Aide and SW. Pt will benefit from [facility] at d/c.     Action/Plan:   CM did make a call to [place] to make them aware that pt is here. [facility] will also monitor pt once d/c. Pt will need HH orders. No further needs from CM at this time.   Anticipated DC Date:  11/03/2013   Anticipated DC Plan:  [facility]  CM consult      [facility] Choice  Resumption Of Svcs/PTA Provider   Choice offered to / List presented to:  C-1 Patient        [place] arranged  HH-1 RN  [place]      [place] agency  [facility]   Status of service:  Completed, signed off Medicare Important Message given?   (If response is "NO", the following Medicare IM given date fields will be blank) Date Medicare IM given:   Date Additional Medicare IM given:    Discharge Disposition:  [place]  Per UR Regulation:  Reviewed for med. necessity/level of care/duration of stay  If discussed at [place] of Stay Meetings, dates discussed:    Comments:

## 2013-11-03 NOTE — Discharge Summary (Signed)
Name: [name] MRN: [id number] DOB: 07/09/1951 63 y.o. PCP: Corky Sox, MD  Date of Admission: 10/31/2013  5:39 PM Date of Discharge: 11/03/2013 Attending Physician: Dr. Ellwood Dense  Discharge Diagnosis: Principal Problem:   Acute ischemic stroke Active Problems:   Hypertension   COPD   Smokes tobacco daily   Claudication, intermittent   Cerebral embolism with cerebral infarction   Alcohol abuse   Prostate cancer  Discharge Medications:   Medication List         albuterol 108 (90 BASE) MCG/ACT inhaler  Commonly known as:  PROVENTIL HFA;VENTOLIN HFA  Inhale 2 puffs into the lungs every 6 (six) hours as needed for wheezing.     atorvastatin 80 MG tablet  Commonly known as:  LIPITOR  Take 1 tablet (80 mg total) by mouth daily at 6 PM.     CALCIUM PO  Take 1 tablet by mouth daily.     clopidogrel 75 MG tablet  Commonly known as:  PLAVIX  Take 1 tablet (75 mg total) by mouth daily with breakfast.     hydrochlorothiazide 25 MG tablet  Commonly known as:  HYDRODIURIL  Take 1 tablet (25 mg total) by mouth daily.     lisinopril 10 MG tablet  Commonly known as:  PRINIVIL,ZESTRIL  Take 1 tablet (10 mg total) by mouth daily.     multivitamin with minerals Tabs tablet  Take 1 tablet by mouth daily.     oxyCODONE-acetaminophen 5-325 MG per tablet  Commonly known as:  PERCOCET/ROXICET  Take 1-2 tablets by mouth every 4 (four) hours as needed for moderate pain.     tiotropium 18 MCG inhalation capsule  Commonly known as:  SPIRIVA HANDIHALER  Place 1 capsule (18 mcg total) into inhaler and inhale daily.        Disposition and follow-up:   [name] was discharged from [facility] in Stable condition.  At the hospital follow up visit please address:  1.  Compliance with Plavix  2.  BP check   3.  Labs / imaging needed at time of follow-up: none  4.  Pending labs/ test needing follow-up: none  Follow-up Appointments: Follow-up  Information   Follow up with Cresenciano Genre, MD On 11/09/2013. (9:45am)    Specialty:  Internal Medicine   Contact information:   [place] [address] [phone]       Follow up with Lenn Cal, DDS On 11/07/2013. (10am)    Specialty:  Dentistry   Contact information:   [place] [id number] [phone]       Follow up with Forbes Cellar, MD In 2 months. (call to schedule)    Specialties:  Neurology, Radiology   Contact information:   [address] [place] [address] [phone]       Follow up with [facility]. Occupational hygienist, Physical Therapy, Aide and Social Worker)    Contact information:   [place] [address] [phone]       Discharge Instructions: Discharge Orders   Future Appointments Provider Department Dept Phone   11/07/2013 10:00 AM Lenn Cal, DDS [place] [phone]   11/09/2013 9:45 AM Cresenciano Genre, MD [place] [phone]   01/10/2014 1:15 PM Corky Sox, MD [facility] [place] [phone]   Future Orders Complete By Expires   Call MD for:  persistant  dizziness or light-headedness  As directed    Diet - low sodium heart healthy  As directed    Increase activity slowly  As directed       Consultations: neurology   Procedures Performed:  Dg Chest 2 View  11/01/2013   CLINICAL DATA:  Stroke.  Weakness.  Hypertension.  EXAM: CHEST  2 VIEW  COMPARISON:  10/24/2013  FINDINGS: Cardiac silhouette is normal in size and configuration. The aorta is mildly uncoiled. No mediastinal or hilar masses.  Lungs show vague nodular opacities most evident near the right apex. There is no lung consolidation to suggest pneumonia. No pulmonary edema. No pleural effusion or pneumothorax.  Bony thorax is demineralized.  IMPRESSION: 1. Vague nodular opacities are suggested in the lungs.  Recommend followup chest CT. 2. No focal consolidation to suggest pneumonia. No evidence of pulmonary edema.   Electronically Signed   By: Lajean Manes M.D.   On: 11/01/2013 09:14   Dg Chest 2 View  10/24/2013   CLINICAL DATA:  Extremity weakness  EXAM: CHEST  2 VIEW  COMPARISON:  NM BONE WHOLE BODY dated 12/26/2012; DG CHEST 2 VIEW dated 12/17/2012  FINDINGS: Normal cardiac silhouette. There is left lower lobe opacity representing atelectasis or infiltrate. Right lung is clear. No pneumothorax. Low lung volumes.  IMPRESSION: Right base atelectasis versus infiltrate.   Electronically Signed   By: Suzy Bouchard M.D.   On: 10/24/2013 18:33   Dg Lumbar Spine Complete  10/25/2013   CLINICAL DATA:  Bilateral leg pain, history of prostate cancer  EXAM: LUMBAR SPINE - COMPLETE 4+ VIEW  COMPARISON:  None.  FINDINGS: Five views of lumbar spine submitted. No acute fracture or subluxation. Multilevel anterior spurring is noted. Mild disc space flattening at L4-L5 level. Moderate disc space flattening at L5-S1 level. Atherosclerotic calcifications of abdominal aorta and iliac arteries. Lateral spurring noted at L3-L4 level.  IMPRESSION: No acute fracture or subluxation. Multilevel anterior spurring. Disc space flattening at L4-L5 and L5-S1 level.   Electronically Signed   By: Lahoma Crocker M.D.   On: 10/25/2013 14:56   Ct Head Wo Contrast  10/31/2013   CLINICAL DATA:  Left-sided weakness  EXAM: CT HEAD WITHOUT CONTRAST  TECHNIQUE: Contiguous axial images were obtained from the base of the skull through the vertex without intravenous contrast.  COMPARISON:  Prior CT from 10/24/2013  FINDINGS: Encephalomalacia within the right cerebellar hemisphere again seen, Stable relative to prior exam. Mild atrophy with moderate chronic microvascular ischemic changes are also stable. Prominent atherosclerotic calcifications noted within the carotid siphons and distal vertebral arteries.  There is no acute intracranial hemorrhage or  large vessel territory infarct. No mass lesion or midline shift. Gray-white matter differentiation is well maintained. Ventricles are normal in size without evidence of hydrocephalus. No extra-axial fluid collection.  The calvarium is intact.  Orbital soft tissues are within normal limits.  There is complete opacification of the left maxillary sinus with mild polypoid opacity within the right maxillary sinus. Minimal opacity seen within the inferior right frontal sinus. No mastoid effusion.  Scalp soft tissues are unremarkable.  IMPRESSION: 1. No acute intracranial process identified. 2. Mild atrophy with moderate chronic microvascular ischemic disease and remote right cerebellar infarct, unchanged. 3. Paranasal sinus disease as above, similar relative to recent CT from 10/24/2013.   Electronically Signed   By: Jeannine Boga M.D.   On: 10/31/2013 18:50   Ct Head Wo Contrast  10/24/2013   CLINICAL DATA:  Left-sided weakness  EXAM:  CT HEAD WITHOUT CONTRAST  TECHNIQUE: Contiguous axial images were obtained from the base of the skull through the vertex without intravenous contrast.  COMPARISON:  NM BONE WHOLE BODY dated 12/26/2012; CT HEAD W/O CM dated 12/07/2012  FINDINGS: There is no evidence of mass effect, midline shift, or extra-axial fluid collections. There is no evidence of a space-occupying lesion or intracranial hemorrhage. There is no evidence of a cortical-based area of acute infarction. There is an old right cerebellar infarct with encephalomalacia. There is generalized cerebral atrophy. There is periventricular white matter low attenuation likely secondary to microangiopathy.  The ventricles and sulci are appropriate for the patient's age. The basal cisterns are patent.  Visualized portions of the orbits are unremarkable. There is mild bilateral ethmoid sinus mucosal thickening. There is complete opacification of the left maxillary sinus. There is mild right maxillary sinus mucosal thickening. The  sphenoid sinuses are not pneumatized. There is mild mucosal thickening in the right frontoethmoidal recess. Cerebrovascular atherosclerotic calcifications are noted.  The osseous structures are unremarkable.  IMPRESSION: 1. No acute intracranial pathology. 2. Sinus disease as described above. 3. Chronic right cerebellar infarct.   Electronically Signed   By: Kathreen Devoid   On: 10/24/2013 18:22   [name]  11/01/2013   CLINICAL DATA:  Stroke. Prostate cancer. Right leg twitching. Inability to speak.  EXAM: MRI HEAD WITHOUT CONTRAST  TECHNIQUE: Multiplanar, multiecho pulse sequences of the brain and surrounding structures were obtained without intravenous contrast.  COMPARISON:  CT HEAD W/O CM dated 10/31/2013; MR HEAD W/O CM dated 12/05/2012; CT HEAD W/O CM dated 10/24/2013  FINDINGS: The patient was unable to remain motionless for the exam. Small or subtle lesions could be overlooked.  Patchy and variably hyperintense areas of restricted diffusion are seen within the medial right frontal lobe and medial right parietal lobe consistent with an acute to subacute right ACA territory infarct. On the previous MR from 2014, a more proximal right ACA infarct affected the genu of the corpus callosum. Pattern of ischemia today is more distal and more superior. No hemorrhage is present.  Chronic right cerebellar infarct. Generalized atrophy. Extensive small vessel disease throughout the periventricular and subcortical white matter. Flow voids are maintained in the carotid, basilar, and vertebral arteries. Proximally, both anterior cerebral arteries appear patent although patency of the distal right ACA in the interhemispheric fissure cannot clearly be established.  Tiny focus chronic hemorrhage left cerebellum and left external capsule where there is a remote hemorrhage/lacunar infarct.  No calvarial or skull base lesions. No visible dural tumor deposits on this noncontrast exam. Unremarkable pituitary and  cerebellar tonsils. Complete or near-complete filling of the left maxillary sinus with fluid or inflammatory tissue.  IMPRESSION: Acute to subacute right ACA territory infarct affecting the medial frontal lobe greater than the medial parietal lobe. No hemorrhage or mass effect.  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 Generalized atrophy with extensive small vessel disease.  No visible intracranial, calvarial or skull base lesions in this patient with prostate cancer.   Electronically Signed   By: Rolla Flatten M.D.   On: 11/01/2013 09:11   Mr Lumbar Spine W Wo Contrast  10/26/2013   CLINICAL DATA:  63 year old male with severe low back pain and difficulty walking. Initial encounter. History of metastatic prostate cancer.  EXAM: MRI LUMBAR SPINE WITHOUT AND WITH CONTRAST  TECHNIQUE: Multiplanar and multiecho pulse  sequences of the lumbar spine were obtained without and with intravenous contrast.  CONTRAST:  76mL MULTIHANCE GADOBENATE DIMEGLUMINE 529 MG/ML IV SOLN  COMPARISON:  Lumbar radiographs 10/25/2013. Nuclear medicine whole-body bone scan 12/1912.  FINDINGS: T1 bone marrow signal is heterogeneous throughout the visible spine and pelvis. The appearance is highly suspicious for numerous small osseous metastases (e.g. Series 6, image 15 in the L2 vertebral body), however, no enhancing or overtly destructive osseous lesion is identified.  Preserved lumbar vertebral height and alignment.  Visualized lower thoracic spinal cord is normal with conus medularis at L1-L2. No abnormal intradural enhancement.  Negative visualized abdominal viscera (simple appearing right renal midpole cyst).  The primary degenerative finding in the lumbar spine is disc bulging and epidural lipomatosis. Details:  T10-T11:  Mild disc desiccation.  T11-T12:  Negative.  T12-L1:  Negative.  L1-L2:  Mild  disc bulge.  L2-L3: Left eccentric circumferential disc osteophyte complex. Mild facet hypertrophy. Mild to moderate epidural lipomatosis. Overall mild spinal stenosis (series 8, image 15). Mild to moderate left L2 foraminal stenosis.  L3-L4: Left eccentric circumferential disc bulge. Mild facet hypertrophy. No significant spinal stenosis. Mild to moderate bilateral L3 foraminal stenosis, greater on the right.  L4-L5: Circumferential disc osteophyte complex. Mild to moderate facet hypertrophy. Mild epidural lipomatosis. No spinal stenosis. Mild to moderate bilateral L4 foraminal stenosis, greater on the right.  L5-S1: Circumferential disc osteophyte complex. Mild facet hypertrophy. Epidural lipomatosis. No spinal stenosis. Mild left and moderate right L5 foraminal stenosis.  IMPRESSION: 1. Heterogeneous bone marrow signal throughout the visible spine and pelvis is highly suspicious for numerous small bone metastases in this setting, even though no destructive or enhancing lesion is identified. A repeat nuclear medicine whole-body bone scan to compare to that on 12/26/2012 would be valuable. 2. Multilevel lumbar spine degenerative changes. Multifactorial mild spinal stenosis at L2-L3. Multilevel multifactorial moderate lumbar neural foraminal stenosis.   Electronically Signed   By: Lars Pinks M.D.   On: 10/26/2013 11:33    2D Echo: pending  Admission HPI:  [name] is a 63 y.o. male w/ PMHx of HTN, COPD, metastatic prostate cancer, stroke in 11/2012, recently discharged from the hospital for multiple tooth extractions and b/l LE weakness, presents w/ uncontrolled RLE twitching and difficulty speaking. The patient claims he was at home watching TV on the couch when he noticed his right heel and foot starting to twitch involuntarily for some time, then involving th majority of his lower leg. He also noticed that he started to have difficulty with speaking, saying he was not able to produce words  appropriately. He denies any other symptoms. No involvement of the LLE or upper extremities, no urinary or fecal incontinence, no loss of consciousness. He also denies any facial changes, vision changes, nausea or vomiting, chest pain, SOB, dizziness, lightheadedness, or palpitations.  The patient was recently admitted to the hospital twice on 10/23/13 for 22 tooth extractions, and re-admitted on 10/24/13 (shortly after discharge) for LE weakness of unknown etiology. MRI of the lumbar spine was performed at that time which showed multilevel degenerative changes w/ spinal stenosis, but w/ no obvious cord compression. The patient has been noted to have T6 metastasis from prostate CA on a past bone scan as well as a significant CVA in 11/2012.    Hospital Course by problem list: 1. Stroke- Patient presented with RLE involuntary movements as well as speech difficulty, both of which had resolved by the time of admission. No other neurologic  symptoms including loss of consciousness, incontinence, decreased strength or sensory abnormalities. Patient has history of two previous strokes, most recent in 11/2012 with residual left leg weakness; on Plavix 75 mg daily at home (ASA allergy). Of note, patient was off Plavix for period of time prior to oral surgery last week (restarted 3/17 per dentistry recs). CT head on admission showed no acute intracranial abnormalities. However, MRI showed acute to subacute right ACA infarct affecting medial frontal lobe (no hemorrhage or mass effect). Stroke workup: Bilateral carotid dopplers negative. Transcranial dopplers negative. Echo read pending.  Risk stratification: A1C 5.6%, LDL 40.  EEG negative for seizure.  PT recommended CIR who was consulted and declined admission; social work spoke with patient at length about options of SNF, ALF both of which patient refused; case management arranged home health services (PT, OT, RN, aide, social worker) prior to discharge, [facility] also in  place.  Continued Plavix 75 mg daily alone (patient has ASA allergy), Lipitor 80 mg daily while inpatient and at discharge.  PCP follow-up arranged prior to discharge; patient also to follow-up with Dr. Leonie Man in 2 months.   2. HTN- On HCTZ 25 mg daily and Lisinopril 10 mg daily at home.  BP elevated while holding anti-hypertensives for permissive HTN x 48 hours.  Restarted home meds on day of discharge.   3. Mild AKI, resolved- Admission Cr 1.30, baseline ~1.0. Patient given NS at 75 cc/hr x 8 hours then eating and drinking well so discontinued. Cr 0.83 on day of discharge.   4. COPD- Stable, lungs CTAB, no concern for acute exacerbation. Continued Spiriva daily, Albuterol prn while inpatient and at discharge.   5. Prostate Cancer- Patient follows with Dr. Janice Norrie.  Per patient, last PSA <1.  Will likely need follow up bone scan outpatient (see last discharge summary).    Discharge Vitals:   BP 173/87  Pulse 66  Temp(Src) 98.4 F (36.9 C) (Oral)  Resp 18  Ht 5\' 8"  (1.727 m)  Wt 163 lb (73.936 kg)  BMI 24.79 kg/m2  SpO2 96%  Discharge Labs:  Results for orders placed during the hospital encounter of 10/31/13 (from the past 24 hour(s))  BASIC METABOLIC PANEL     Status: None   Collection Time    11/03/13  4:32 AM      Result Value Ref Range   Sodium 140  137 - 147 mEq/L   Potassium 4.1  3.7 - 5.3 mEq/L   Chloride 104  96 - 112 mEq/L   CO2 21  19 - 32 mEq/L   Glucose, Bld 93  70 - 99 mg/dL   BUN 11  6 - 23 mg/dL   Creatinine, Ser 0.83  0.50 - 1.35 mg/dL   Calcium 10.0  8.4 - 10.5 mg/dL   GFR calc non Af Amer >90  >90 mL/min   GFR calc Af Amer >90  >90 mL/min    Signed: Ivin Poot, MD 11/03/2013, 1:54 PM   Time Spent on Discharge: 40 minutes Services Ordered on Discharge: none Equipment Ordered on Discharge: none

## 2013-11-03 NOTE — Progress Notes (Signed)
Subjective: [name] is doing well this morning, no complaints.  Reiterated that he is not interested in going to SNF or ALF, prefers to go home today with home health services.   Objective: Vital signs in last 24 hours: Filed Vitals:   11/02/13 0400 11/02/13 1410 11/02/13 2105 11/03/13 0551  BP: 163/96 147/81 183/93 177/95  Pulse: 74 74 64 58  Temp: 97.6 F (36.4 C) 98 F (36.7 C) 98.3 F (36.8 C) 98.4 F (36.9 C)  TempSrc: Axillary Oral Oral Oral  Resp: 18 20 18 18   Height:      Weight: 163 lb (73.936 kg)     SpO2: 99% 100% 100% 96%   Weight change:   Intake/Output Summary (Last 24 hours) at 11/03/13 0726 Last data filed at 11/03/13 3154  Gross per 24 hour  Intake    600 ml  Output   1100 ml  Net   -500 ml   PEX General: alert, cooperative, and in no apparent distress HEENT: NCAT, vision grossly intact, mild oral swelling (improved since last admission) Neck: supple, no lymphadenopathy Lungs: clear to ascultation bilaterally, normal work of respiration, no wheezes, rales, ronchi Heart: regular rate and rhythm, no murmurs, gallops, or rubs Abdomen: soft, non-tender, non-distended, normal bowel sounds Extremities: 2+ DP/PT pulses bilaterally, no cyanosis, clubbing, or edema Neurologic: alert & oriented X3, cranial nerves II-XII intact, sensation intact to light touch, strength 4+/5 in BLE  Lab Results: Basic Metabolic Panel:  Recent Labs Lab 11/02/13 0537 11/03/13 0432  NA 141 140  K 3.3* 4.1  CL 106 104  CO2 24 21  GLUCOSE 90 93  BUN 13 11  CREATININE 0.79 0.83  CALCIUM 9.9 10.0   CBC:  Recent Labs Lab 10/31/13 1952  HGB 11.2*  HCT 33.0*    Micro Results: Recent Results (from the past 240 hour(s))  CULTURE, BLOOD (ROUTINE X 2)     Status: None   Collection Time    10/24/13  6:45 PM      Result Value Ref Range Status   Specimen Description BLOOD LEFT ARM   Final   Special Requests BOTTLES DRAWN AEROBIC AND ANAEROBIC 5CC   Final   Culture   Setup Time     Final   Value: 10/25/2013 01:02     Performed at [facility]   Culture     Final   Value: NO GROWTH 5 DAYS     Performed at [facility]   Report Status 10/31/2013 FINAL   Final  CULTURE, BLOOD (ROUTINE X 2)     Status: None   Collection Time    10/24/13  7:17 PM      Result Value Ref Range Status   Specimen Description BLOOD RIGHT FOREARM   Final   Special Requests BOTTLES DRAWN AEROBIC AND ANAEROBIC 5CC   Final   Culture  Setup Time     Final   Value: 10/25/2013 01:04     Performed at [facility]   Culture     Final   Value: NO GROWTH 5 DAYS     Performed at [facility]   Report Status 10/31/2013 FINAL   Final  URINE CULTURE     Status: None   Collection Time    10/25/13  4:10 AM      Result Value Ref Range Status   Specimen Description URINE, CLEAN CATCH   Final   Special Requests Normal   Final   Culture  Setup Time     Final   Value: 10/25/2013 08:05     Performed at [facility]     Final   Value: >=100,000 COLONIES/ML     Performed at [facility]   Culture     Final   Value: Multiple bacterial morphotypes present, none predominant. Suggest appropriate recollection if clinically indicated.     Performed at [facility]   Report Status 10/26/2013 FINAL   Final  GRAM STAIN     Status: None   Collection Time    10/25/13  4:11 AM      Result Value Ref Range Status   Specimen Description URINE, CLEAN CATCH   Final   Special Requests Normal   Final   Gram Stain     Final   Value: [id number]     WBC PRESENT,BOTH PMN AND MONONUCLEAR     Multiple bacterial morphotypes present, none predominant. Suggest appropriate recollection if clinically indicated.     YEAST     Results Called toZelphia Cairo RN [phone] Perlie Mayo   Report Status 10/25/2013 FINAL   Final   Studies/Results: Dg Chest 2 View  11/01/2013   CLINICAL DATA:  Stroke.  Weakness.  Hypertension.  EXAM: CHEST  2 VIEW   COMPARISON:  10/24/2013  FINDINGS: Cardiac silhouette is normal in size and configuration. The aorta is mildly uncoiled. No mediastinal or hilar masses.  Lungs show vague nodular opacities most evident near the right apex. There is no lung consolidation to suggest pneumonia. No pulmonary edema. No pleural effusion or pneumothorax.  Bony thorax is demineralized.  IMPRESSION: 1. Vague nodular opacities are suggested in the lungs. Recommend followup chest CT. 2. No focal consolidation to suggest pneumonia. No evidence of pulmonary edema.   Electronically Signed   By: Lajean Manes M.D.   On: 11/01/2013 09:14   [name]  11/01/2013   CLINICAL DATA:  Stroke. Prostate cancer. Right leg twitching. Inability to speak.  EXAM: MRI HEAD WITHOUT CONTRAST  TECHNIQUE: Multiplanar, multiecho pulse sequences of the brain and surrounding structures were obtained without intravenous contrast.  COMPARISON:  CT HEAD W/O CM dated 10/31/2013; MR HEAD W/O CM dated 12/05/2012; CT HEAD W/O CM dated 10/24/2013  FINDINGS: The patient was unable to remain motionless for the exam. Small or subtle lesions could be overlooked.  Patchy and variably hyperintense areas of restricted diffusion are seen within the medial right frontal lobe and medial right parietal lobe consistent with an acute to subacute right ACA territory infarct. On the previous MR from 2014, a more proximal right ACA infarct affected the genu of the corpus callosum. Pattern of ischemia today is more distal and more superior. No hemorrhage is present.  Chronic right cerebellar infarct. Generalized atrophy. Extensive small vessel disease throughout the periventricular and subcortical white matter. Flow voids are maintained in the carotid, basilar, and vertebral arteries. Proximally, both anterior cerebral arteries appear patent although patency of the distal right ACA in the interhemispheric fissure cannot clearly be established.  Tiny focus chronic hemorrhage left  cerebellum and left external capsule where there is a remote hemorrhage/lacunar infarct.  No calvarial or skull base lesions. No visible dural tumor deposits on this noncontrast exam. Unremarkable pituitary and cerebellar tonsils. Complete or near-complete filling of the left maxillary sinus with fluid or inflammatory tissue.  IMPRESSION: Acute to subacute right ACA territory infarct affecting the medial frontal lobe greater than the medial parietal lobe. No  hemorrhage or mass effect.  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 Generalized atrophy with extensive small vessel disease.  No visible intracranial, calvarial or skull base lesions in this patient with prostate cancer.   Electronically Signed   By: Rolla Flatten M.D.   On: 11/01/2013 09:11   Medications: I have reviewed the patient's current medications. Scheduled Meds: . atorvastatin  80 mg Oral q1800  . clopidogrel  75 mg Oral Q breakfast  . heparin  5,000 Units Subcutaneous 3 times per day  . multivitamin with minerals  1 tablet Oral Daily  . nicotine  14 mg Transdermal Daily  . tiotropium  18 mcg Inhalation Daily    PRN Meds:.albuterol, senna-docusate Assessment/Plan: #Stroke- Patient presented with RLE involuntary movements as well as speech difficulty, both of which had resolved by the time of admission.  No other neurologic symptoms including loss of consciousness, incontinence, decreased strength or sensory abnormalities.  Patient has history of two previous strokes, most recent in 11/2012 with residual left leg weakness; on Plavix 75 mg daily at home (ASA allergy).  Of note, patient was off Plavix for period of time prior to oral surgery last week (restarted 3/17 per dentistry recs).  CT head on admission showed no acute intracranial abnormalities.  However, MRI showed acute to subacute right ACA  infarct affecting medial frontal lobe (no hemorrhage or mass effect).  Stroke workup: Bilateral carotid dopplers negative.  Transcranial dopplers negative.  Risk stratification: A1C 5.6%, LDL 40.  EEG negative for seizure. -neurology consult, appreciate recs; per Dr. Leonie Man, ok to discharge today  -PT recommended CIR who was consulted and declined admissino; social work spoke with patient at length about options of SNF, ALF both of which patient refused; case management to arrange home health services, [facility]  -echo read pending -continue Plavix 75 mg daily, Lipitor 80 mg daily   -neuro checks q4h  #HTN- On HCTZ 25 mg daily and Lisinopril 10 mg daily at home. BP have been elevated systolic but held anti-hypertensives for permissive HTN x 48 hours.   -restart HCTZ and lisinopril   #Mild AKI- Admission Cr 1.30, baseline ~1.0.  Patient given NS at 75 cc/hr x 8 hours.  Now eating and drinking well.  Cr 0.83 today.   #COPD- Stable, lungs CTAB, no concern for acute exacerbation at this time.  -continue Spiriva daily, Albuterol prn   #Prostate Cancer- Patient following with Dr. Janice Norrie, see above. Per patient, last PSA <1. Follow-up outpatient.  Patient will likely need follow up bone scan on outpatient.   #Bed Bugs- Patient found to have bed bugs at previous admission.  -contact isolation   Dispo:  Anticipated discharge today.    The patient does have a current PCP Corky Sox, MD) and does need an [facility] hospital follow-up appointment after discharge.   .Services Needed at time of discharge: Y = Yes, Blank = No PT:   OT:   RN:   Equipment:   Other:     LOS: 3 days   Ivin Poot, MD 11/03/2013, 7:26 AM

## 2013-11-03 NOTE — Progress Notes (Signed)
Occupational Therapy Treatment Patient Details Name: [name] MRN: [id number] DOB: 1950-09-23 Today's Date: 11/03/2013    History of present illness Mr. Babington is a 63 year old man with a history of metastatic prostate cancer, previous CVA in April of 2014, hypertension, and chronic obstructive pulmonary disease who presents with the acute onset of bilateral lower extremity weakness and uncontrolled right lower extremity twitching and difficulty speaking. He has been discharged from the hospital twice in the past month after a total tooth extraction and subsequent weakness. The cause of his lower extremity weakness was not elucidated during the previous admission although he was found to have spinal stenosis secondary to degenerative changes with no cord compression. Patient reports the onset of right foot twitching that was involuntary. It progressed to involve most of the lower right extremity. At that time he also noted difficulty with speaking, particularly having a hard time producing words. He denied any headaches, visual changes, nausea, vomiting, shortness of breath, dizziness, palpitations, or other acute neurologic symptoms. He presented to the emergency department and was admitted to the internal medicine teaching service for further evaluation.   OT comments  Pt making progress with functional goals and should continue with acute OT services to increase level of function and safety. Pt continues to demo decreased ADL mobility safety using RW, impulsivity requiring verbal and physical cues for safety. Pt refusing SNF rehab ,but agreeable to [facility]. Safety is a concern and pt states that he has a roommate to help him  Follow Up Recommendations  Home health OT;Supervision/Assistance - 24 hour    Equipment Recommendations  Tub/shower bench;Tub/shower seat    Recommendations for Other Services      Precautions / Restrictions Precautions Precautions: Fall Restrictions Weight Bearing  Restrictions: No       Mobility Bed Mobility Overal bed mobility: Modified Independent                Transfers Overall transfer level: Needs assistance Equipment used: Rolling walker (2 wheeled) Transfers: Sit to/from Stand Sit to Stand: Min guard;Min assist         General transfer comment: assist for balance, cues for safely using RW/problem solving    Balance Overall balance assessment: Needs assistance Sitting-balance support: No upper extremity supported;Feet supported Sitting balance-Leahy Scale: Good     Standing balance support: Single extremity supported;During functional activity;No upper extremity supported Standing balance-Leahy Scale: Fair Standing balance comment: Pt able to stand at sink for UB bathing and grooming with RW                   ADL   Grooming: Wash/dry hands;Wash/dry face;Standing;Supervision/safety   Upper Body Dressing : Supervision/safety;Set up;Min guard;Standing     Toilet Transfer: Min guard;Regular Toilet;Grab bars Toileting- Water quality scientist and Hygiene: Min guard;Sit to/from stand Tub/ Shower Transfer: Minimal assistance;Min guard;Grab bars;Shower seat Functional mobility during ADLs: Minimal assistance;Rolling walker;Cueing for safety;Min guard                                        Cognition   Behavior During Therapy: Impulsive Overall Cognitive Status: No family/caregiver present to determine baseline cognitive functioning Area of Impairment: Safety/judgement;Awareness;Problem solving          Safety/Judgement: Decreased awareness of safety   Problem Solving: Slow processing;Difficulty sequencing;Requires verbal cues;Requires tactile cues  General Comments  Pt pleasant and cooperative    Pertinent Vitals/ Pain       No c/o pain                                                          Frequency Min 2X/week      Progress Toward Goals  OT Goals(current goals can now be found in the care plan section)  Progress towards OT goals: Progressing toward goals     Plan Other (comment) (pt declining SNF rehab, planning for [facility])    End of Session Equipment Utilized During Treatment: Rolling walker  Activity Tolerance Patient tolerated treatment well   Patient Left in chair;with call bell/phone within reach             Time: 1057-1113 OT Time Calculation (min): 16 min  Charges: OT General Charges $OT Visit: 1 Procedure OT Treatments $Self Care/Home Management : 8-22 mins  Britt Bottom 11/03/2013, 1:17 PM

## 2013-11-03 NOTE — Progress Notes (Signed)
Stroke Team Progress Note  HISTORY [name] is an 63 y.o. male with a history of hypertension, COPD, stroke and prostate cancer who was hospitalized within the past week for lower extremity weakness of unclear etiology. No specific cause for transient lower extremity weakness was found. Patient was subsequently discharged to home. He returned 10/31/2013 with a complaint of spontaneous involuntary twitching of his right lower extremity as well as inability to speak. Patient knew what he wanted to say and could understand others around him at the time. He had no symptoms involving his upper extremity. He stated that the speech changes were similar to changes he had at the onset of previous stroke. CT scan of his head showed no acute intracranial abnormality. He'd been taking Plavix for antiplatelet therapy. Twitching resolved and speech had returned to baseline when seen by Dr. Nicole Kindred in the emergency department.    SUBJECTIVE The patient's wife is present. The patient has no complaints.  OBJECTIVE Most recent Vital Signs: Filed Vitals:   11/02/13 0400 11/02/13 1410 11/02/13 2105 11/03/13 0551  BP: 163/96 147/81 183/93 177/95  Pulse: 74 74 64 58  Temp: 97.6 F (36.4 C) 98 F (36.7 C) 98.3 F (36.8 C) 98.4 F (36.9 C)  TempSrc: Axillary Oral Oral Oral  Resp: 18 20 18 18   Height:      Weight: 163 lb (73.936 kg)     SpO2: 99% 100% 100% 96%   CBG (last 3)  No results found for this basename: GLUCAP,  in the last 72 hours  IV Fluid Intake:     MEDICATIONS  . atorvastatin  80 mg Oral q1800  . clopidogrel  75 mg Oral Q breakfast  . heparin  5,000 Units Subcutaneous 3 times per day  . hydrochlorothiazide  25 mg Oral Daily  . lisinopril  10 mg Oral Daily  . multivitamin with minerals  1 tablet Oral Daily  . nicotine  14 mg Transdermal Daily  . tiotropium  18 mcg Inhalation Daily   PRN:  albuterol, senna-docusate  Diet:  Bland thin liquids Activity:  Bedrest DVT Prophylaxis:   Subcutaneous Heparin  CLINICALLY SIGNIFICANT STUDIES Basic Metabolic Panel:  Recent Labs Lab 11/02/13 0537 11/03/13 0432  NA 141 140  K 3.3* 4.1  CL 106 104  CO2 24 21  GLUCOSE 90 93  BUN 13 11  CREATININE 0.79 0.83  CALCIUM 9.9 10.0   Liver Function Tests: No results found for this basename: AST, ALT, ALKPHOS, BILITOT, PROT, ALBUMIN,  in the last 168 hours CBC:  Recent Labs Lab 10/31/13 1952  HGB 11.2*  HCT 33.0*   Coagulation: No results found for this basename: LABPROT, INR,  in the last 168 hours Cardiac Enzymes: No results found for this basename: CKTOTAL, CKMB, CKMBINDEX, TROPONINI,  in the last 168 hours Urinalysis: No results found for this basename: COLORURINE, APPERANCEUR, LABSPEC, PHURINE, GLUCOSEU, HGBUR, BILIRUBINUR, KETONESUR, PROTEINUR, UROBILINOGEN, NITRITE, LEUKOCYTESUR,  in the last 168 hours Lipid Panel    Component Value Date/Time   CHOL 95 11/01/2013 0935   TRIG 128 11/01/2013 0935   HDL 29* 11/01/2013 0935   CHOLHDL 3.3 11/01/2013 0935   VLDL 26 11/01/2013 0935   LDLCALC 40 11/01/2013 0935   HgbA1C  Lab Results  Component Value Date   HGBA1C 5.6 11/01/2013    Urine Drug Screen:     Component Value Date/Time   LABOPIA NONE DETECTED 10/25/2013 0410   LABOPIA NEGATIVE 10/14/2010 2140   COCAINSCRNUR NONE DETECTED 10/25/2013 0410  COCAINSCRNUR  Value: POSITIVE (NOTE) Result repeated and verified. Sent for confirmatory testing* 10/14/2010 2140   LABBENZ NONE DETECTED 10/25/2013 0410   LABBENZ NEGATIVE 10/14/2010 2140   AMPHETMU NONE DETECTED 10/25/2013 0410   AMPHETMU NEGATIVE 10/14/2010 2140   THCU NONE DETECTED 10/25/2013 0410   LABBARB NONE DETECTED 10/25/2013 0410    Alcohol Level: No results found for this basename: ETH,  in the last 168 hours  Dg Chest 2 View 11/01/2013    1. Vague nodular opacities are suggested in the lungs. Recommend followup chest CT. 2. No focal consolidation to suggest pneumonia. No evidence of pulmonary edema.     Mr Brain Wo  Contrast 11/01/2013    Acute to subacute right ACA territory infarct affecting the medial frontal lobe greater than the medial parietal lobe. No hemorrhage or mass effect.  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 Generalized atrophy with extensive small vessel disease.  No visible intracranial, calvarial or skull base lesions in this patient with prostate cancer.       CT of the brain  10/31/2013  1. No acute intracranial process identified. 2. Mild atrophy with moderate chronic microvascular ischemic disease and remote right cerebellar infarct, unchanged. 3. Paranasal sinus disease as above, similar relative to recent CT from 10/24/2013.  Transcranial Dopplers -  This was a normal transcranial Doppler study    2D Echocardiogram    Carotid Doppler  no significant stenosis  CXR    EKG - sinus rhythm rate 84 beats per minute  For complete results please see formal report.   Therapy Recommendations - home health occupational therapy  Physical Exam    Mental Status:  Alert, oriented, thought content appropriate. Speech moderately dysarthric (edentulous) without evidence of aphasia. Able to follow commands without difficulty.  Cranial Nerves:  II-Visual fields were normal.  III/IV/VI-Pupils were equal and reacted. Extraocular movements were full and conjugate.  V/VII-no facial numbness and no facial weakness.  VIII-normal.  X-speech was moderately dysarthric but consistent with edentulous state; symmetrical palatal movement.  XII-midline tongue extension  Motor: Minimal hip flexor weakness, left greater than right, and unchanged from 10/26/2013; normal muscle tone throughout.  Sensory: Normal throughout.  Deep Tendon Reflexes: 1+ and symmetric.  Plantars: Flexor bilaterally  Cerebellar: Normal finger-to-nose testing.        ASSESSMENT [name] is a 63 y.o. male presenting with twitching of his right leg and inability to speak. T-PA therapy was not initiated as the patient's deficits quickly resolved.administered as the patient's deficits quickly resolved. MRI - acute to subacute right ACA territory infarct affecting the medial frontal lobe greater than the medial parietal lobe and a chronic right cerebellar infarct and chronic right ACA corpus callosum infarct. Infarct felt to be thrombotic secondary to small vessel disease. On clopidogrel 75 mg orally every day prior to admission. Now on clopidogrel 75 mg orally every day for secondary stroke prevention. Patient with resultant dysarthria. Stroke work up completed.  Probable recent TIAs. Positive UDS - cocaine  Hyperlipidemia - cholesterol 95 LDL 40 - Lipitor 80 mg daily prior to admission.  Hypokalemia  COPD history - ongoing tobacco use  Prostate cancer  Hypertension history  Previous CVA  Vague nodular opacities on chest x-ray - followup CT recommended  Aspirin allergy    Hospital day # 3  TREATMENT/PLAN  Continue clopidogrel 75 mg orally every day for secondary stroke  prevention.   Risk factor modification - substance abuse discussed   Rehabilitation evaluation - skilled nursing facility recommended  Followup CT for vague nodular opacities on chest x-ray.  Stroke team sign off.   Followup Dr. Leonie Man in 2 months.    Mikey Bussing PA-C [facility] Pager [phone] 11/03/2013, 7:55 AM  I have personally obtained a history, examined the patient, evaluated imaging results, and formulated the assessment and plan of care. I agree with the above. Antony Contras, MD   To contact Stroke Continuity provider, please refer to [email or web address] After hours, contact General Neurology

## 2013-11-07 ENCOUNTER — Encounter (HOSPITAL_COMMUNITY): Payer: Self-pay | Admitting: Dentistry

## 2013-11-07 ENCOUNTER — Ambulatory Visit (HOSPITAL_COMMUNITY): Payer: Medicaid - Dental | Admitting: Dentistry

## 2013-11-07 ENCOUNTER — Telehealth: Payer: Self-pay | Admitting: Licensed Clinical Social Worker

## 2013-11-07 VITALS — BP 162/88 | HR 67 | Temp 97.6°F

## 2013-11-07 DIAGNOSIS — K08109 Complete loss of teeth, unspecified cause, unspecified class: Secondary | ICD-10-CM

## 2013-11-07 DIAGNOSIS — K000 Anodontia: Secondary | ICD-10-CM

## 2013-11-07 DIAGNOSIS — T148XXD Other injury of unspecified body region, subsequent encounter: Secondary | ICD-10-CM

## 2013-11-07 DIAGNOSIS — K08199 Complete loss of teeth due to other specified cause, unspecified class: Secondary | ICD-10-CM

## 2013-11-07 DIAGNOSIS — C61 Malignant neoplasm of prostate: Secondary | ICD-10-CM

## 2013-11-07 DIAGNOSIS — Z0189 Encounter for other specified special examinations: Secondary | ICD-10-CM

## 2013-11-07 MED ORDER — CHLORHEXIDINE GLUCONATE 0.12 % MT SOLN: OROMUCOSAL | Status: DC

## 2013-11-07 NOTE — Progress Notes (Signed)
POST OPERATIVE NOTE:  11/07/2013 [name] [id number]  VITALS: BP 162/88  Pulse 67  Temp(Src) 97.6 F (36.4 C) (Oral)  LABS:  Lab Results  Component Value Date   WBC 8.9 10/25/2013   HGB 11.2* 10/31/2013   HCT 33.0* 10/31/2013   MCV 86.3 10/25/2013   PLT 276 10/25/2013   BMET    Component Value Date/Time   NA 140 11/03/2013 0432   K 4.1 11/03/2013 0432   CL 104 11/03/2013 0432   CO2 21 11/03/2013 0432   GLUCOSE 93 11/03/2013 0432   BUN 11 11/03/2013 0432   CREATININE 0.83 11/03/2013 0432   CREATININE 1.01 10/10/2013 1448   CALCIUM 10.0 11/03/2013 0432   GFRNONAA >90 11/03/2013 0432   GFRNONAA 81 10/18/2012 0927   GFRAA >90 11/03/2013 0432   GFRAA >89 10/18/2012 0927    Lab Results  Component Value Date   INR 1.04 12/21/2012   INR 0.99 12/05/2012   INR 0.92 10/14/2010   No results found for this basename: PTT     [name] is status post extraction remaining teeth with alveoloplasty and pre-prosthetic surgery in the operating room on 10/23/2013. The patient now presents for evaluation of healing and suture removal as indicated.  SUBJECTIVE: Patient with minimal oral complaints. Patient is no longer taking any pain medication. Patient indicates that "all stitches are gone".   EXAM: There is no sign of infection, heme, or ooze. There are no sutures that remain. Patient would generalized primary closure and acceptable healing on the lower arch. The maxillary arch is healing in by generalized primary closure but has several areas in the molar that are healing in by secondary intention and show some signs of delayed healing.  This is consistent with the extensive surgery involved in the upper right and upper left posterior quadrants.   ASSESSMENT: Post operative course is consistent with dental procedures performed in the operating room. The patient is now edentulous. There is some delayed healing involving the upper right and upper left posterior quadrants.  PLAN: 1.  Rinse with chlorhexidine gluconate rinses twice daily as prescribed. Prescription was sent to his pharmacy. 2. Use salt water rinses every 2 hours while awake in between the chlorhexidine rinses. 3. Return to clinic in 2 weeks for re-evaluation of the healing. 4. Obtain prior approval from Medicaid for upper and lower complete dentures. 5. Call if problems arise before then.   Lenn Cal, DDS

## 2013-11-07 NOTE — Patient Instructions (Addendum)
PLAN: 1. Rinse with chlorhexidine gluconate rinses twice daily as prescribed. Prescription was sent to his pharmacy. 2. Use salt water rinses every 2 hours while awake in between the chlorhexidine rinses. 3. Return to clinic in 2 weeks for re-evaluation of the healing. 4. Obtain prior approval from Medicaid for upper and lower complete dentures. 5. Call if problems arise before then. Dr. Enrique Sack   MOUTH CARE AFTER SURGERY  FACTS:  Ice used in ice bag helps keep the swelling down, and can help lessen the pain.  It is easier to treat pain BEFORE it happens.  Spitting disturbs the clot and may cause bleeding to start again, or to get worse.  Smoking delays healing and can cause complications.  Sharing prescriptions can be dangerous.  Do not take medications not recently prescribed for you.  Antibiotics may stop birth control pills from working.  Use other means of birth control while on antibiotics.  Warm salt water rinses after the first 24 hours will help lessen the swelling:  Use 1/2 teaspoonful of table salt per oz.of water.  DO NOT:  Do not spit.  Do not drink through a straw.  Strongly advised not to smoke, dip snuff or chew tobacco at least for 3 days.  Do not eat sharp or crunchy foods.  Avoid the area of surgery when chewing.  Do not stop your antibiotics before your instructions say to do so.  Do not eat hot foods until bleeding has stopped.  If you need to, let your food cool down to room temperature.  EXPECT:  Some swelling, especially first 2-3 days.  Soreness or discomfort in varying degrees.  Follow your dentist's instructions about how to handle pain before it starts.  Pinkish saliva or light blood in saliva, or on your pillow in the morning.  This can last around 24 hours.  Bruising inside or outside the mouth.  This may not show up until 2-3 days after surgery.  Don't worry, it will go away in time.  Pieces of "bone" may work themselves loose.  It's  OK.  If they bother you, let us know.  WHAT TO DO IMMEDIATELY AFTER SURGERY:  Bite on the gauze with steady pressure for 1-2 hours.  Don't chew on the gauze.  Do not lie down flat.  Raise your head support especially for the first 24 hours.  Apply ice to your face on the side of the surgery.  You may apply it 20 minutes on and a few minutes off.  Ice for 8-12 hours.  You may use ice up to 24 hours.  Before the numbness wears off, take a pain pill as instructed.  Prescription pain medication is not always required.  SWELLING:  Expect swelling for the first couple of days.  It should get better after that.  If swelling increases 3 days or so after surgery; let us know as soon as possible.  FEVER:  Take Tylenol every 4 hours if needed to lower your temperature, especially if it is at 100F or higher.  Drink lots of fluids.  If the fever does not go away, let us know.  BREATHING TROUBLE:  Any unusual difficulty breathing means you have to have someone bring you to the emergency room ASAP  BLEEDING:  Light oozing is expected for 24 hours or so.  Prop head up with pillows  Avoid spitting  Do not confuse bright red fresh flowing blood with lots of saliva colored with a little bit of blood.  If you notice some bleeding, place gauze or a tea bag where it is bleeding and apply CONSTANT pressure by biting down for 1 hour.  Avoid talking during this time.  Do not remove the gauze or tea bag during this hour to "check" the bleeding.  If you notice bright RED bleeding FLOWING out of particular area, and filling the floor of your mouth, put a wad of gauze on that area, bite down firmly and constantly.  Call us immediately.  If we're closed, have someone bring you to the emergency room.  ORAL HYGIENE:  Brush your teeth as usual after meals and before bedtime.  Use a soft toothbrush around the area of surgery.  DO NOT AVOID BRUSHING.  Otherwise bacteria(germs) will grow and may  delay healing or encourage infection.  Since you cannot spit, just gently rinse and let the water flow out of your mouth.  DO NOT SWISH HARD.  EATING:  Cool liquids are a good point to start.  Increase to soft foods as tolerated.  PRESCRIPTIONS:  Follow the directions for your prescriptions exactly as written.  If Dr. Enrique Sack gave you a narcotic pain medication, do not drive, operate machinery or drink alcohol when on that medication.  QUESTIONS:  Call our office during office hours [phone] or call the Emergency Room at [phone].

## 2013-11-07 NOTE — Telephone Encounter (Signed)
Pt is current with [facility] health.  CSW received call from [facility] with [facility] requesting order for [facility] RN for 1x week x 3 weeks for med management.

## 2013-11-09 ENCOUNTER — Encounter: Payer: Self-pay | Admitting: Licensed Clinical Social Worker

## 2013-11-09 ENCOUNTER — Encounter: Payer: Self-pay | Admitting: Internal Medicine

## 2013-11-09 ENCOUNTER — Ambulatory Visit (INDEPENDENT_AMBULATORY_CARE_PROVIDER_SITE_OTHER): Payer: Medicaid Other | Admitting: Internal Medicine

## 2013-11-09 VITALS — BP 119/71 | HR 77 | Temp 97.5°F | Ht 68.5 in

## 2013-11-09 DIAGNOSIS — I639 Cerebral infarction, unspecified: Secondary | ICD-10-CM

## 2013-11-09 DIAGNOSIS — Z8673 Personal history of transient ischemic attack (TIA), and cerebral infarction without residual deficits: Secondary | ICD-10-CM

## 2013-11-09 DIAGNOSIS — R5381 Other malaise: Secondary | ICD-10-CM

## 2013-11-09 DIAGNOSIS — I10 Essential (primary) hypertension: Secondary | ICD-10-CM

## 2013-11-09 DIAGNOSIS — F17200 Nicotine dependence, unspecified, uncomplicated: Secondary | ICD-10-CM

## 2013-11-09 MED ORDER — HYDROCHLOROTHIAZIDE 25 MG PO TABS: 25.0000 mg | ORAL_TABLET | Freq: Every day | ORAL | Status: DC

## 2013-11-09 MED ORDER — LISINOPRIL 10 MG PO TABS: 10.0000 mg | ORAL_TABLET | Freq: Every day | ORAL | Status: DC

## 2013-11-09 NOTE — Assessment & Plan Note (Signed)
BP Readings from Last 3 Encounters:  11/09/13 119/71  11/07/13 162/88  11/03/13 173/87    Lab Results  Component Value Date   NA 140 11/03/2013   K 4.1 11/03/2013   CREATININE 0.83 11/03/2013    Assessment: Blood pressure control: controlled Progress toward BP goal:  at goal Comments: none  Plan: Medications:  continue current medications-Rx refills today both medications  Educational resources provided: brochure;handout Self management tools provided: other (see comments) Other plans: f/u in 3 months

## 2013-11-09 NOTE — Discharge Summary (Signed)
INTERNAL MEDICINE ATTENDING DISCHARGE COSIGN   I evaluated the patient on the day of discharge and discussed the discharge plan with my resident team. I agree with the discharge documentation and disposition.   Stephenson, Huxley 11/09/2013, 11:07 AM

## 2013-11-09 NOTE — Progress Notes (Signed)
   Subjective:    Patient ID: [name], male    DOB: [birth year], 63 y.o.   MRN: [id number]  HPI Comments: 63 y.o. male w/ PMHx of HTN (119/71), COPD, metastatic prostate cancer, stroke with weakness, poor dentition, b/l LE weakness, alcohol abuse, tobacco abuse (still smoking 1/2 ppd down from 1 ppd).  Wt 163 lbs  1) He presents for HFU for Right ACA territory stroke 3/24-3/27.  He states he feels pretty good.  Requesting toilet chair and needs PCS and H/H RN 1 x per week x 3 weeks for medication management. He is also interested in outpatient PT as PT helped him post stroke.   Since the stroke it has been hard to understand his speech and he has had left arm and leg weakness and uses a walker and needs a wheelchair for long distances.   2) HTN-needs Rx refills of Lisinopril and HCTZ-refilled today   -Spoke with social worker who states patient can only have H/H or PT not both and at [place]. Discharge it was rec he go to SNF and he did not go.    SH: lives with roommate, no children                                Review of Systems  HENT: Negative for trouble swallowing.   Respiratory: Negative for shortness of breath.   Cardiovascular: Negative for chest pain.  Neurological: Positive for weakness. Negative for headaches.       Objective:   Physical Exam  Nursing note and vitals reviewed. Constitutional: He is oriented to person, place, and time. Vital signs are normal. He appears well-developed and well-nourished. He is cooperative.  HENT:  Head: Normocephalic and atraumatic.  Mouth/Throat: Oropharynx is clear and moist and mucous membranes are normal. Abnormal dentition. No oropharyngeal exudate.  Eyes: Conjunctivae are normal. Pupils are equal, round, and reactive to light. Right eye exhibits no discharge. Left eye exhibits no discharge. No scleral icterus.  Cardiovascular: Normal rate, regular rhythm, S1 normal, S2 normal and normal heart sounds.   No murmur heard. No lower  ext edema   Pulmonary/Chest: Effort normal and breath sounds normal.  Abdominal: Soft. Bowel sounds are normal. He exhibits no distension. There is no tenderness.  Musculoskeletal: He exhibits no edema.  Neurological: He is alert and oriented to person, place, and time.  Left hemiparesis 3-4/5, right strength normal  In wheelchair   Skin: Skin is warm, dry and intact. No rash noted.  Psychiatric: He has a normal mood and affect. His speech is normal and behavior is normal. Judgment and thought content normal. Cognition and memory are normal.  Slurred speech          Assessment & Plan:  F/u in 3 months

## 2013-11-09 NOTE — Assessment & Plan Note (Signed)
Ordered DME manual wheel chair and toilet seat elevated  Ordered outpatient PT. Will hold on H/H RN for med management for now Continue Plavix  F/u in 3 months

## 2013-11-09 NOTE — Assessment & Plan Note (Signed)
  Assessment: Progress toward smoking cessation:  smoking less (1 ppd to 1/2 ppd ) Barriers to progress toward smoking cessation:   (duration ) Comments: none  Plan: Instruction/counseling given:  I counseled patient on the dangers of tobacco use, advised patient to stop smoking, and reviewed strategies to maximize success. Educational resources provided:  [employer] account manager) brochure Self management tools provided:  other (see comments) Medications to assist with smoking cessation:  None Patient agreed to the following self-care plans for smoking cessation: call [employer] (1-800-QUIT-NOW)  Other plans: encourage cessation

## 2013-11-09 NOTE — Patient Instructions (Addendum)
General Instructions: Follow up for 3-6 weeks  We will set up outpatient physical therapy  Pick up your medication from the pharmacy    Treatment Goals:  Goals (1 Years of Data) as of 11/09/13         As of Today 11/07/13 11/03/13 11/03/13 11/02/13     Blood Pressure    . Blood Pressure < 140/90  119/71 162/88 173/87 177/95 183/93     Lifestyle    . Quit smoking / using tobacco           Result Component    . LDL CALC < 100            Progress Toward Treatment Goals:  Treatment Goal 11/09/2013  Blood pressure at goal  Stop smoking smoking less  Prevent falls at goal    Self Care Goals & Plans:  Self Care Goal 11/09/2013  Manage my medications take my medicines as prescribed; bring my medications to every visit; refill my medications on time; follow the sick day instructions if I am sick  Monitor my health keep track of my blood pressure  Eat healthy foods eat more vegetables; eat fruit for snacks and desserts; eat baked foods instead of fried foods; eat smaller portions; eat foods that are low in salt; drink diet soda or water instead of juice or soda  Be physically active find an activity I enjoy  Stop smoking call [facility] (1-800-QUIT-NOW)  Meeting treatment goals maintain the current self-care plan    No flowsheet data found.   Care Management & Community Referrals:  Referral 11/09/2013  Referrals made for care management support social worker  Referrals made to community resources none       Smoking Cessation Quitting smoking is important to your health and has many advantages. However, it is not always easy to quit since nicotine is a very addictive drug. Often times, people try 3 times or more before being able to quit. This document explains the best ways for you to prepare to quit smoking. Quitting takes hard work and a lot of effort, but you can do it. ADVANTAGES OF QUITTING SMOKING  You will live longer, feel better, and live better.  Your body will feel the  impact of quitting smoking almost immediately.  Within 20 minutes, blood pressure decreases. Your pulse returns to its normal level.  After 8 hours, carbon monoxide levels in the blood return to normal. Your oxygen level increases.  After 24 hours, the chance of having a heart attack starts to decrease. Your breath, hair, and body stop smelling like smoke.  After 48 hours, damaged nerve endings begin to recover. Your sense of taste and smell improve.  After 72 hours, the body is virtually free of nicotine. Your bronchial tubes relax and breathing becomes easier.  After 2 to 12 weeks, lungs can hold more air. Exercise becomes easier and circulation improves.  The risk of having a heart attack, stroke, cancer, or lung disease is greatly reduced.  After 1 year, the risk of coronary heart disease is cut in half.  After 5 years, the risk of stroke falls to the same as a nonsmoker.  After 10 years, the risk of lung cancer is cut in half and the risk of other cancers decreases significantly.  After 15 years, the risk of coronary heart disease drops, usually to the level of a nonsmoker.  If you are pregnant, quitting smoking will improve your chances of having a healthy baby.  The people  you live with, especially any children, will be healthier.  You will have extra money to spend on things other than cigarettes. QUESTIONS TO THINK ABOUT BEFORE ATTEMPTING TO QUIT You may want to talk about your answers with your caregiver.  Why do you want to quit?  If you tried to quit in the past, what helped and what did not?  What will be the most difficult situations for you after you quit? How will you plan to handle them?  Who can help you through the tough times? Your family? Friends? A caregiver?  What pleasures do you get from smoking? What ways can you still get pleasure if you quit? Here are some questions to ask your caregiver:  How can you help me to be successful at quitting?  What  medicine do you think would be best for me and how should I take it?  What should I do if I need more help?  What is smoking withdrawal like? How can I get information on withdrawal? GET READY  Set a quit date.  Change your environment by getting rid of all cigarettes, ashtrays, matches, and lighters in your home, car, or work. Do not let people smoke in your home.  Review your past attempts to quit. Think about what worked and what did not. GET SUPPORT AND ENCOURAGEMENT You have a better chance of being successful if you have help. You can get support in many ways.  Tell your family, friends, and co-workers that you are going to quit and need their support. Ask them not to smoke around you.  Get individual, group, or telephone counseling and support. Programs are available at [employer] and health centers. Call your local health department for information about programs in your area.  Spiritual beliefs and practices may help some smokers quit.  Download a "quit meter" on your computer to keep track of quit statistics, such as how long you have gone without smoking, cigarettes not smoked, and money saved.  Get a self-help book about quitting smoking and staying off of tobacco. [place] yourself from urges to smoke. Talk to someone, go for a walk, or occupy your time with a task.  Change your normal routine. Take a different route to work. Drink tea instead of coffee. Eat breakfast in a different place.  Reduce your stress. Take a hot bath, exercise, or read a book.  Plan something enjoyable to do every day. Reward yourself for not smoking.  Explore interactive web-based programs that specialize in helping you quit. GET MEDICINE AND USE IT CORRECTLY Medicines can help you stop smoking and decrease the urge to smoke. Combining medicine with the above behavioral methods and support can greatly increase your chances of successfully quitting  smoking.  Nicotine replacement therapy helps deliver nicotine to your body without the negative effects and risks of smoking. Nicotine replacement therapy includes nicotine gum, lozenges, inhalers, nasal sprays, and skin patches. Some may be available over-the-counter and others require a prescription.  Antidepressant medicine helps people abstain from smoking, but how this works is unknown. This medicine is available by prescription.  Nicotinic receptor partial agonist medicine simulates the effect of nicotine in your brain. This medicine is available by prescription. Ask your caregiver for advice about which medicines to use and how to use them based on your health history. Your caregiver will tell you what side effects to look out for if you choose to be on a medicine or therapy. Carefully  read the information on the package. Do not use any other product containing nicotine while using a nicotine replacement product.  RELAPSE OR DIFFICULT SITUATIONS Most relapses occur within the first 3 months after quitting. Do not be discouraged if you start smoking again. Remember, most people try several times before finally quitting. You may have symptoms of withdrawal because your body is used to nicotine. You may crave cigarettes, be irritable, feel very hungry, cough often, get headaches, or have difficulty concentrating. The withdrawal symptoms are only temporary. They are strongest when you first quit, but they will go away within 10 14 days. To reduce the chances of relapse, try to:  Avoid drinking alcohol. Drinking lowers your chances of successfully quitting.  Reduce the amount of caffeine you consume. Once you quit smoking, the amount of caffeine in your body increases and can give you symptoms, such as a rapid heartbeat, sweating, and anxiety.  Avoid smokers because they can make you want to smoke.  Do not let weight gain distract you. Many smokers will gain weight when they quit, usually less  than 10 pounds. Eat a healthy diet and stay active. You can always lose the weight gained after you quit.  Find ways to improve your mood other than smoking. FOR MORE INFORMATION  www.smokefree.gov  Document Released: 07/21/2001 Document Revised: 01/26/2012 Document Reviewed: 11/05/2011 [facility] Patient Information 2014 [place], [place]. Stroke Prevention Some medical conditions and behaviors are associated with an increased chance of having a stroke. You may prevent a stroke by making healthy choices and managing medical conditions. HOW CAN I REDUCE MY RISK OF HAVING A STROKE?   Stay physically active. Get at least 30 minutes of activity on most or all days.  Do not smoke. It may also be helpful to avoid exposure to secondhand smoke.  Limit alcohol use. Moderate alcohol use is considered to be:  No more than 2 drinks per day for men.  No more than 1 drink per day for nonpregnant women.  Eat healthy foods. This involves  Eating 5 or more servings of fruits and vegetables a day.  Following a diet that addresses high blood pressure (hypertension), high cholesterol, diabetes, or obesity.  Manage your cholesterol levels.  A diet low in saturated fat, trans fat, and cholesterol and high in fiber may control cholesterol levels.  Take any prescribed medicines to control cholesterol as directed by your health care provider.  Manage your diabetes.  A controlled-carbohydrate, controlled-sugar diet is recommended to manage diabetes.  Take any prescribed medicines to control diabetes as directed by your health care provider.  Control your hypertension.  A low-salt (sodium), low-saturated fat, low-trans fat, and low-cholesterol diet is recommended to manage hypertension.  Take any prescribed medicines to control hypertension as directed by your health care provider.  Maintain a healthy weight.  A reduced-calorie, low-sodium, low-saturated fat, low-trans fat, low-cholesterol diet is  recommended to manage weight.  Stop drug abuse.  Avoid taking birth control pills.  Talk to your health care provider about the risks of taking birth control pills if you are over 71 years old, smoke, get migraines, or have ever had a blood clot.  Get evaluated for sleep disorders (sleep apnea).  Talk to your health care provider about getting a sleep evaluation if you snore a lot or have excessive sleepiness.  Take medicines as directed by your health care provider.  For some people, aspirin or blood thinners (anticoagulants) are helpful in reducing the risk of forming abnormal blood clots  that can lead to stroke. If you have the irregular heart rhythm of atrial fibrillation, you should be on a blood thinner unless there is a good reason you cannot take them.  Understand all your medicine instructions.  Make sure that other other conditions (such as anemia or atherosclerosis) are addressed. SEEK IMMEDIATE MEDICAL CARE IF:   You have sudden weakness or numbness of the face, arm, or leg, especially on one side of the body.  Your face or eyelid droops to one side.  You have sudden confusion.  You have trouble speaking (aphasia) or understanding.  You have sudden trouble seeing in one or both eyes.  You have sudden trouble walking.  You have dizziness.  You have a loss of balance or coordination.  You have a sudden, severe headache with no known cause.  You have new chest pain or an irregular heartbeat. Any of these symptoms may represent a serious problem that is an emergency. Do not wait to see if the symptoms will go away. Get medical help at once. Call your local emergency services  (911 in [place] Do not drive yourself to the hospital. Document Released: 09/03/2004 Document Revised: 05/17/2013 Document Reviewed: 01/27/2013 [facility] Patient Information 2014 [place].  Hypertension As your heart beats, it forces blood through your arteries. This force is your blood  pressure. If the pressure is too high, it is called hypertension (HTN) or high blood pressure. HTN is dangerous because you may have it and not know it. High blood pressure may mean that your heart has to work harder to pump blood. Your arteries may be narrow or stiff. The extra work puts you at risk for heart disease, stroke, and other problems.  Blood pressure consists of two numbers, a higher number over a lower, 110/72, for example. It is stated as "110 over 72." The ideal is below 120 for the top number (systolic) and under 80 for the bottom (diastolic). Write down your blood pressure today. You should pay close attention to your blood pressure if you have certain conditions such as:  Heart failure.  Prior heart attack.  Diabetes  Chronic kidney disease.  Prior stroke.  Multiple risk factors for heart disease. To see if you have HTN, your blood pressure should be measured while you are seated with your arm held at the level of the heart. It should be measured at least twice. A one-time elevated blood pressure reading (especially in the Emergency Department) does not mean that you need treatment. There may be conditions in which the blood pressure is different between your right and left arms. It is important to see your caregiver soon for a recheck. Most people have essential hypertension which means that there is not a specific cause. This type of high blood pressure may be lowered by changing lifestyle factors such as:  Stress.  Smoking.  Lack of exercise.  Excessive weight.  Drug/tobacco/alcohol use.  Eating less salt. Most people do not have symptoms from high blood pressure until it has caused damage to the body. Effective treatment can often prevent, delay or reduce that damage. TREATMENT  When a cause has been identified, treatment for high blood pressure is directed at the cause. There are a large number of medications to treat HTN. These fall into several categories, and  your caregiver will help you select the medicines that are best for you. Medications may have side effects. You should review side effects with your caregiver. If your blood pressure stays high after  you have made lifestyle changes or started on medicines,   Your medication(s) may need to be changed.  Other problems may need to be addressed.  Be certain you understand your prescriptions, and know how and when to take your medicine.  Be sure to follow up with your caregiver within the time frame advised (usually within two weeks) to have your blood pressure rechecked and to review your medications.  If you are taking more than one medicine to lower your blood pressure, make sure you know how and at what times they should be taken. Taking two medicines at the same time can result in blood pressure that is too low. SEEK IMMEDIATE MEDICAL CARE IF:  You develop a severe headache, blurred or changing vision, or confusion.  You have unusual weakness or numbness, or a faint feeling.  You have severe chest or abdominal pain, vomiting, or breathing problems. MAKE SURE YOU:   Understand these instructions.  Will watch your condition.  Will get help right away if you are not doing well or get worse. Document Released: 07/27/2005 Document Revised: 10/19/2011 Document Reviewed: 03/16/2008 [facility] Patient Information 2014 [place].  Fall Prevention and Home Safety Falls cause injuries and can affect all age groups. It is possible to prevent falls.  HOW TO PREVENT FALLS  Wear shoes with rubber soles that do not have an opening for your toes.  Keep the inside and outside of your house well lit.  Use night lights throughout your home.  Remove clutter from floors.  Clean up floor spills.  Remove throw rugs or fasten them to the floor with carpet tape.  Do not place electrical cords across pathways.  Put grab bars by your tub, shower, and toilet. Do not use towel bars as grab  bars.  Put handrails on both sides of the stairway. Fix loose handrails.  Do not climb on stools or stepladders, if possible.  Do not wax your floors.  Repair uneven or unsafe sidewalks, walkways, or stairs.  Keep items you use a lot within reach.  Be aware of pets.  Keep emergency numbers next to the telephone.  Put smoke detectors in your home and near bedrooms. Ask your doctor what other things you can do to prevent falls. Document Released: 05/23/2009 Document Revised: 01/26/2012 Document Reviewed: 10/27/2011 [facility] Patient Information 2014 [place], [place].

## 2013-11-12 NOTE — ED Provider Notes (Signed)
CSN: [id number]     Arrival date & time 10/31/13  1739 History   First MD Initiated Contact with Patient 10/31/13 1755     Chief Complaint  Patient presents with  . Weakness    Left sided     (Consider location/radiation/quality/duration/timing/severity/associated sxs/prior Treatment) HPI  Past Medical History  Diagnosis Date  . Hypertension 05/18/2007  . COPD 05/18/2007  . Elevated PSA 05/25/2007  . 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 Stroke 12/05/2012  . Prostate cancer    Past Surgical History  Procedure Laterality Date  . 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 Multiple extractions with alveoloplasty N/A 10/23/2013    Procedure: Extraction of tooth #'s 1,2,3,4,5,6,7,11,12,13,14,15,16,20,21,22,23,24,27,28,29,32 with alveoloplasty and bilateral maxillary tuberosity reductions;  Surgeon: Lenn Cal, DDS;  Location: [place];  Service: Oral Surgery;  Laterality: N/A;  . Leg surgery      pin  right leg   Family History  Problem Relation Age of Onset  . Liver disease Mother   . Heart attack Mother 53  . Colon cancer Neg Hx    History  Substance Use Topics  . Smoking status: Current Every Day Smoker -- 0.50 packs/day for 50 years    Types: Cigarettes  . Smokeless tobacco: Never Used     Comment: 1/2 ppd, tobacco info given 10/16/13  . Alcohol Use: 7 - 10.5 oz/week    14-21 drink(s) per week     Comment: 2-3 beers per day    Review of Systems  Unable to perform ROS: Acuity of condition      Allergies  Aspirin and Penicillins  Home Medications   Current Outpatient Rx  Name  Route  Sig  Dispense  Refill  . albuterol (PROVENTIL HFA;VENTOLIN HFA) 108 (90 BASE) MCG/ACT inhaler   Inhalation   Inhale 2 puffs into the lungs every 6 (six) hours as needed for wheezing.   8.5 g   3   . atorvastatin (LIPITOR) 80 MG tablet   Oral   Take 1 tablet (80 mg total) by mouth daily at 6 PM.   30 tablet   5   . CALCIUM PO   Oral   Take 1 tablet by mouth  daily.         . clopidogrel (PLAVIX) 75 MG tablet   Oral   Take 1 tablet (75 mg total) by mouth daily with breakfast.         . Multiple Vitamin (MULTIVITAMIN WITH MINERALS) TABS tablet   Oral   Take 1 tablet by mouth daily.         Marland Kitchen tiotropium (SPIRIVA HANDIHALER) 18 MCG inhalation capsule   Inhalation   Place 1 capsule (18 mcg total) into inhaler and inhale daily.   30 capsule   5   . chlorhexidine (PERIDEX) 0.12 % solution      Rinse with 15 mls twice daily for 30 seconds. Use after breakfast and at bedtime. Spit out excess. Do not swallow.   960 mL   3   . hydrochlorothiazide (HYDRODIURIL) 25 MG tablet   Oral   Take 1 tablet (25 mg total) by mouth daily.   30 tablet   5   . lisinopril (PRINIVIL,ZESTRIL) 10 MG tablet   Oral   Take 1 tablet (10 mg total) by mouth daily.   30 tablet   2    BP 173/87  Pulse 66  Temp(Src) 98.4 F (36.9 C) (Oral)  Resp 18  Ht 5\' 8"  (1.727 m)  Wt 163 lb (73.936 kg)  BMI 24.79 kg/m2  SpO2 96% Physical Exam  Nursing note and vitals reviewed. Constitutional: He appears well-developed and well-nourished. No distress.  HENT:  Head: Normocephalic and atraumatic.  Eyes: Pupils are equal, round, and reactive to light.  Neck: Normal range of motion.  Cardiovascular: Normal rate and intact distal pulses.   Pulmonary/Chest: No respiratory distress.  Abdominal: Normal appearance. He exhibits no distension.  Musculoskeletal: Normal range of motion.  Neurological: He is alert. A cranial nerve deficit is present.  Neurologic Examination:                                                                                                      Mental Status: Alert, oriented, thought content appropriate.  Speech moderately dysarthric (edentulous) without evidence of aphasia. Able to follow commands without difficulty.  Motor: l hip flexor weakness, left greater than right; normal muscle tone throughout. Sensory: Normal throughout. Deep  Tendon Reflexes: 1+ and symmetric. Plantars: Flexor bilaterally Cerebellar: Normal finger-to-nose testing.  Skin: Skin is warm and dry. No rash noted.  Psychiatric: He has a normal mood and affect. His behavior is normal.   Per EMS, pt exhibiting stroke symptoms = left sided weakness, unknown onset, facial droop but may be due to having teeth pulled week ago. Colonoscopy 2 weeks ago. Hx three previous strokes and HTN. VS 146/82, HR 86, RR 20, SpO2 95 on room air. 18g R AC.  Pt states left sided weakness started last Tuesday (a week ago). Denies tingling/numbness on affected side. Denies having any pain. Denies respiratory problems. Denies dysphagia. Denies headaches. Pt alert and oriented x 4. States dr. Ronnald Ramp in internal medicine is his personal doctor. No acute distress. Respirations equal and unlabored. Skin warm and dry.  ED Course  Procedures (including critical care time) Labs Review Labs Reviewed  LIPID PANEL - Abnormal; Notable for the following:    HDL 29 (*)    All other components within normal limits  BASIC METABOLIC PANEL - Abnormal; Notable for the following:    Potassium 3.3 (*)    All other components within normal limits  I-STAT CHEM 8, ED - Abnormal; Notable for the following:    BUN 28 (*)    Calcium, Ion 1.38 (*)    Hemoglobin 11.2 (*)    HCT 33.0 (*)    All other components within normal limits  HEMOGLOBIN K5G  BASIC METABOLIC PANEL   Imaging Review IMPRESSION: Acute to subacute right ACA territory infarct affecting the medial frontal lobe greater than the medial parietal lobe. No hemorrhage or mass effect.  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 Generalized atrophy with extensive small vessel disease.  No visible intracranial, calvarial or skull base lesions in this patient with prostate cancer.     EKG  Interpretation   Date/Time:  Tuesday October 31 2013 17:48:28 EDT Ventricular Rate:  84 PR Interval:  206 QRS Duration: 113 QT Interval:  407 QTC Calculation: 481 R Axis:   -65 Text Interpretation:  Age not entered, assumed to be  63 years old for  purpose of ECG interpretation Sinus rhythm Borderline prolonged PR  interval Left anterior fascicular block Abnormal R-wave progression, early  transition Minimal ST depression, inferior leads Borderline prolonged QT  interval ED PHYSICIAN INTERPRETATION AVAILABLE IN [facility]  Confirmed by TEST, Record ([id number]) on 11/02/2013 7:27:40 AM       CRITICAL CARE Performed by: Leonard Schwartz L Total critical care time: 30 min Critical care time was exclusive of separately billable procedures and treating other patients. Critical care was necessary to treat or prevent imminent or life-threatening deterioration. Critical care was time spent personally by me on the following activities: development of treatment plan with patient and/or surrogate as well as nursing, discussions with consultants, evaluation of patient's response to treatment, examination of patient, obtaining history from patient or surrogate, ordering and performing treatments and interventions, ordering and review of laboratory studies, ordering and review of radiographic studies, pulse oximetry and re-evaluation of patient's condition.  MDM   Final diagnoses:  Focal seizure  Cerebral embolism with cerebral infarction  Essential hypertension, benign  Alcohol abuse  Claudication, intermittent  COPD (chronic obstructive pulmonary disease)  Dyslipidemia  Elevated prostate specific antigen (PSA)  Smokes tobacco daily  Acute ischemic stroke  Generalized weakness  Malignant hypertension        Dot Lanes, MD 11/12/13 [phone]

## 2013-11-13 NOTE — Progress Notes (Signed)
CSW met with Mr. Ferencz and friend during his scheduled hospital follow up visit.  Pt/friend inquiring about PCS request, Dr. Aundra Dubin completed and signed during this visit but form was also completed during his hospitalization discharge.  CSW informed Mr. Schewe  to return/answer calls from [facility] as they will need to come out and complete his assessment.  Pt states friend is affiliated with [facility] and is requesting copy to be provided to friend and faxed to [facility].  Mr. Sizemore requesting additional [facility] PT services but states his [facility] PT medicaid benefit exhausted after 3 visits but states she was progressing and still is in need of PT services.  During hospitalization SNF was recommended but pt declined.  CSW discussed option of outpatient PT services.  Pt/friend deny transportation issues and would be able to attend outpatient PT visits.  CSW informed Mr. Buffalo only outpatient or [facility] services could be provided at a time, as pt would no longer have a home bound status.  Pt in agreement and would like referral for outpatient PT, pt discussed with physician.  Mr. Casto would no longer be eligible for skilled [facility] RN services but still in need of nursing for pill box management.  Pt in agreement, contacted [place] and spoke with [place].  Arville Go confirmed they are able to offer pill box management under medicaid benefit.   CSW discussed [facility] for community care management.  Pt/friend states [facility] met with them during hospitalization and they were signed up then but have yet to have contact.  CSW contacted [facility].  Pt has been assigned to Eunice Blase, LPN for care management services.  [place] attempted contact on 4/2 but pt's telephone disconnected.   Mr. Slape states he lost his wallet during one of his hospitalizations.  His wallet contained his ID, insurance and SS card.  Pt is in the process of applying for replacements.  CSW provided pt/friend with listing of acceptable forms of ID  for the purpose for replacing his SS card.

## 2013-11-15 NOTE — Progress Notes (Signed)
Case discussed with Dr. McLean at the time of the visit.  We reviewed the resident's history and exam and pertinent patient test results.  I agree with the assessment, diagnosis, and plan of care documented in the resident's note.     

## 2013-11-17 NOTE — Addendum Note (Signed)
Addended by: Cresenciano Genre on: 11/17/2013 01:02 PM   Modules accepted: Level of Service

## 2013-11-17 NOTE — Addendum Note (Signed)
Addended by: Cresenciano Genre on: 11/17/2013 01:04 PM   Modules accepted: Level of Service

## 2013-11-21 ENCOUNTER — Ambulatory Visit (HOSPITAL_COMMUNITY): Payer: Medicaid - Dental | Admitting: Dentistry

## 2013-11-21 ENCOUNTER — Encounter (HOSPITAL_COMMUNITY): Payer: Self-pay | Admitting: Dentistry

## 2013-11-21 DIAGNOSIS — K08109 Complete loss of teeth, unspecified cause, unspecified class: Secondary | ICD-10-CM

## 2013-11-21 NOTE — Patient Instructions (Signed)
PLAN: 1. Continue to rinse with chlorhexidine gluconate rinses twice daily as prescribed. 2. Use salt water rinses every 2 hours while awake in between the chlorhexidine rinses. 3. Return to clinic in 2 weeks for start of upper and lower complete dentures. Patient is aware of the procedures involved and fabrication of upper lower complete dentures over a 6-8 week period. 4. Call if problems arise before then.

## 2013-11-21 NOTE — Progress Notes (Signed)
Limited Oral Examination:  11/21/2013 Paton Crum Billing [id number]  VITALS: BP 117/78  Pulse 81  Temp(Src) 97.9 F (36.6 C) (Oral)   [name] is status post extraction of remaining teeth with alveoloplasty and pre-prosthetic surgery in the operating room on 10/23/2013. The patient was seen for a postop evaluation on 11/07/2013. Several delayed healing areas were noted in the upper right and upper left maxilla. Patient was then rescheduled for 2 weeks for reevaluation of healing prior to start of upper and lower complete dentures. The patient was also placed on chlorhexidine rinse. Patient was to rinse twice daily as prescribed Medicaid prior approval was sent for the fabrication of upper lower complete dentures at that time. The patient now presents for re-evaluation of healing.  SUBJECTIVE: Patient with minimal oral complaints. Patient is using his chlorhexidine rinses as prescribed.    EXAM: There is no sign of infection, heme, or ooze. There are no sutures that remain. Patient would generalized primary closure and acceptable healing on the lower arch. The maxillary arch in the posterior quadrants is healing well. There is no sign of delayed healing at this time.   ASSESSMENT: Post operative course is consistent with dental procedures performed in the operating room. The patient is now edentulous. The previous areas of delayed healing involving the upper right and upper left posterior quadrants now appears to be healing well. Medicaid prior approval has been obtained for the upper and lower complete denture fabrication.  PLAN: 1. Continue to rinse with chlorhexidine gluconate rinses twice daily as prescribed. 2. Use salt water rinses every 2 hours while awake in between the chlorhexidine rinses. 3. Return to clinic in 2 weeks for start of upper and lower complete dentures. Patient is aware of the procedures involved and fabrication of upper lower complete dentures over a 6-8  week period. 4. Call if problems arise before then.   Lenn Cal, DDS

## 2013-11-23 ENCOUNTER — Other Ambulatory Visit: Payer: Self-pay

## 2013-11-23 ENCOUNTER — Inpatient Hospital Stay (HOSPITAL_COMMUNITY): Payer: Medicaid Other

## 2013-11-23 ENCOUNTER — Emergency Department (HOSPITAL_COMMUNITY): Payer: Medicaid Other

## 2013-11-23 ENCOUNTER — Observation Stay (HOSPITAL_COMMUNITY)
Admission: EM | Admit: 2013-11-23 | Discharge: 2013-11-24 | Disposition: A | Discharge status: Home Health | Payer: Medicaid Other | Attending: Internal Medicine | Admitting: Internal Medicine

## 2013-11-23 ENCOUNTER — Encounter (HOSPITAL_COMMUNITY): Payer: Self-pay | Admitting: Emergency Medicine

## 2013-11-23 DIAGNOSIS — F1010 Alcohol abuse, uncomplicated: Secondary | ICD-10-CM

## 2013-11-23 DIAGNOSIS — J4489 Other specified chronic obstructive pulmonary disease: Secondary | ICD-10-CM | POA: Insufficient documentation

## 2013-11-23 DIAGNOSIS — Z720 Tobacco use: Secondary | ICD-10-CM | POA: Diagnosis present

## 2013-11-23 DIAGNOSIS — R262 Difficulty in walking, not elsewhere classified: Secondary | ICD-10-CM | POA: Insufficient documentation

## 2013-11-23 DIAGNOSIS — F17200 Nicotine dependence, unspecified, uncomplicated: Secondary | ICD-10-CM | POA: Insufficient documentation

## 2013-11-23 DIAGNOSIS — E876 Hypokalemia: Secondary | ICD-10-CM | POA: Insufficient documentation

## 2013-11-23 DIAGNOSIS — R29810 Facial weakness: Secondary | ICD-10-CM | POA: Insufficient documentation

## 2013-11-23 DIAGNOSIS — C61 Malignant neoplasm of prostate: Secondary | ICD-10-CM | POA: Insufficient documentation

## 2013-11-23 DIAGNOSIS — R972 Elevated prostate specific antigen [PSA]: Secondary | ICD-10-CM | POA: Insufficient documentation

## 2013-11-23 DIAGNOSIS — Z8673 Personal history of transient ischemic attack (TIA), and cerebral infarction without residual deficits: Secondary | ICD-10-CM

## 2013-11-23 DIAGNOSIS — I739 Peripheral vascular disease, unspecified: Secondary | ICD-10-CM

## 2013-11-23 DIAGNOSIS — I6350 Cerebral infarction due to unspecified occlusion or stenosis of unspecified cerebral artery: Secondary | ICD-10-CM

## 2013-11-23 DIAGNOSIS — R531 Weakness: Secondary | ICD-10-CM | POA: Diagnosis present

## 2013-11-23 DIAGNOSIS — J449 Chronic obstructive pulmonary disease, unspecified: Secondary | ICD-10-CM | POA: Diagnosis present

## 2013-11-23 DIAGNOSIS — E785 Hyperlipidemia, unspecified: Secondary | ICD-10-CM | POA: Diagnosis present

## 2013-11-23 DIAGNOSIS — C801 Malignant (primary) neoplasm, unspecified: Secondary | ICD-10-CM | POA: Insufficient documentation

## 2013-11-23 DIAGNOSIS — R29898 Other symptoms and signs involving the musculoskeletal system: Secondary | ICD-10-CM | POA: Insufficient documentation

## 2013-11-23 DIAGNOSIS — Z7902 Long term (current) use of antithrombotics/antiplatelets: Secondary | ICD-10-CM | POA: Insufficient documentation

## 2013-11-23 DIAGNOSIS — I69998 Other sequelae following unspecified cerebrovascular disease: Principal | ICD-10-CM | POA: Insufficient documentation

## 2013-11-23 DIAGNOSIS — D649 Anemia, unspecified: Secondary | ICD-10-CM | POA: Insufficient documentation

## 2013-11-23 DIAGNOSIS — I672 Cerebral atherosclerosis: Secondary | ICD-10-CM | POA: Insufficient documentation

## 2013-11-23 DIAGNOSIS — I10 Essential (primary) hypertension: Secondary | ICD-10-CM | POA: Diagnosis present

## 2013-11-23 DIAGNOSIS — I639 Cerebral infarction, unspecified: Secondary | ICD-10-CM

## 2013-11-23 LAB — PROTIME-INR
INR: 1.07 (ref 0.00–1.49)
Prothrombin Time: 13.7 seconds (ref 11.6–15.2)

## 2013-11-23 LAB — COMPREHENSIVE METABOLIC PANEL
ALK PHOS: 71 U/L (ref 39–117)
ALT: 30 U/L (ref 0–53)
AST: 34 U/L (ref 0–37)
Albumin: 3.4 g/dL — ABNORMAL LOW (ref 3.5–5.2)
BILIRUBIN TOTAL: 0.2 mg/dL — AB (ref 0.3–1.2)
BUN: 16 mg/dL (ref 6–23)
CHLORIDE: 98 meq/L (ref 96–112)
CO2: 15 meq/L — AB (ref 19–32)
CREATININE: 1.11 mg/dL (ref 0.50–1.35)
Calcium: 10.1 mg/dL (ref 8.4–10.5)
GFR calc Af Amer: 79 mL/min — ABNORMAL LOW (ref >90)
GFR, EST NON AFRICAN AMERICAN: 68 mL/min — AB (ref >90)
Glucose, Bld: 117 mg/dL — ABNORMAL HIGH (ref 70–99)
Potassium: 3.5 mEq/L — ABNORMAL LOW (ref 3.7–5.3)
Sodium: 136 mEq/L — ABNORMAL LOW (ref 137–147)
Total Protein: 8 g/dL (ref 6.0–8.3)

## 2013-11-23 LAB — DIFFERENTIAL
Basophils Absolute: 0 10*3/uL (ref 0.0–0.1)
Basophils Relative: 0 % (ref 0–1)
Eosinophils Absolute: 0.1 10*3/uL (ref 0.0–0.7)
Eosinophils Relative: 1 % (ref 0–5)
LYMPHS PCT: 52 % — AB (ref 12–46)
Lymphs Abs: 3.5 10*3/uL (ref 0.7–4.0)
MONOS PCT: 9 % (ref 3–12)
Monocytes Absolute: 0.6 10*3/uL (ref 0.1–1.0)
NEUTROS ABS: 2.6 10*3/uL (ref 1.7–7.7)
Neutrophils Relative %: 38 % — ABNORMAL LOW (ref 43–77)

## 2013-11-23 LAB — I-STAT TROPONIN, ED: Troponin i, poc: 0 ng/mL (ref 0.00–0.08)

## 2013-11-23 LAB — CBC
HCT: 27.9 % — ABNORMAL LOW (ref 39.0–52.0)
HEMOGLOBIN: 9.1 g/dL — AB (ref 13.0–17.0)
MCH: 27.7 pg (ref 26.0–34.0)
MCHC: 32.6 g/dL (ref 30.0–36.0)
MCV: 85.1 fL (ref 78.0–100.0)
Platelets: 232 10*3/uL (ref 150–400)
RBC: 3.28 MIL/uL — AB (ref 4.22–5.81)
RDW: 14.3 % (ref 11.5–15.5)
WBC: 6.8 10*3/uL (ref 4.0–10.5)

## 2013-11-23 LAB — I-STAT CHEM 8, ED
BUN: 14 mg/dL (ref 6–23)
CALCIUM ION: 1.24 mmol/L (ref 1.13–1.30)
Chloride: 104 mEq/L (ref 96–112)
Creatinine, Ser: 1.2 mg/dL (ref 0.50–1.35)
GLUCOSE: 115 mg/dL — AB (ref 70–99)
HCT: 29 % — ABNORMAL LOW (ref 39.0–52.0)
Hemoglobin: 9.9 g/dL — ABNORMAL LOW (ref 13.0–17.0)
POTASSIUM: 3.3 meq/L — AB (ref 3.7–5.3)
SODIUM: 138 meq/L (ref 137–147)
TCO2: 17 mmol/L (ref 0–100)

## 2013-11-23 LAB — APTT: APTT: 26 s (ref 24–37)

## 2013-11-23 LAB — ETHANOL: Alcohol, Ethyl (B): <11 mg/dL (ref 0–11)

## 2013-11-23 LAB — I-STAT CG4 LACTIC ACID, ED: LACTIC ACID, VENOUS: 3.37 mmol/L — AB (ref 0.5–2.2)

## 2013-11-23 MED ORDER — POTASSIUM CHLORIDE IN NACL 20-0.9 MEQ/L-% IV SOLN
INTRAVENOUS | Status: DC
  Administered 2013-11-23: 1000 mL via INTRAVENOUS
  Filled 2013-11-23 (×3): qty 1000

## 2013-11-23 MED ORDER — ENOXAPARIN SODIUM 40 MG/0.4ML ~~LOC~~ SOLN
40.0000 mg | SUBCUTANEOUS | Status: DC
  Administered 2013-11-23: 40 mg via SUBCUTANEOUS
  Filled 2013-11-23 (×2): qty 0.4

## 2013-11-23 MED ORDER — SODIUM CHLORIDE 0.9 % IJ SOLN: 3.0000 mL | Freq: Two times a day (BID) | INTRAMUSCULAR | Status: DC

## 2013-11-23 NOTE — H&P (Signed)
Date: 11/23/2013               Patient Name:  [name] MRN: [id number]  DOB: 1951/08/03 Age / Sex: 63 y.o., male   PCP: Corky Sox, MD              Medical Service: Internal Medicine Teaching Service              Attending Physician: Dr. Madilyn Fireman, MD    First Contact: Durene Fruits, MS 3 Pager: [phone]  Second Contact: Dr. Mechele Claude Pager: [phone]  Third Contact Dr. Eulas Post Pager: [phone]       After Hours (After 5p/  First Contact Pager: [phone]  weekends / holidays): Second Contact Pager: [phone]   Chief Complaint:   Left sided weakness  History of Present Illness:  [name] is a 64 year old male presents with worsening left-sided weakness. He had a stroke on 11/01/13 and had left-sided weakness of upper and lower extremities at that time. He was last seen normal at 1410, approximately 30 minutes prior to coming to the ER.  According to nursing staff from EMS the patient was last seen normal by his home health nurse. His roommate later found him stomping on the ground indicating that he needed help. At that time he was noted as more weak than normal and sent to the ER. [name] reports that he was attempting to nap when he noticed that his right foot and left arm began to "start jumping". He reports that he did not experience pain and had no weakness and that the involuntary movements ceased when the paramedics arrived. He denies sob, chest pain, palpitations, nausea, vomiting, abdominal pain, headache, or dizziness. He is currently on Plavix 75 mg daily at home (Aspirin allergy).   Meds: No current facility-administered medications for this encounter.   Current Outpatient Prescriptions  Medication Sig Dispense Refill  . albuterol (PROVENTIL HFA;VENTOLIN HFA) 108 (90 BASE) MCG/ACT inhaler Inhale 2 puffs into the lungs every 6 (six) hours as needed for wheezing.  8.5 g  3  . atorvastatin (LIPITOR) 80 MG tablet Take 1 tablet (80 mg total) by mouth daily at 6 PM.  30  tablet  5  . CALCIUM PO Take 1 tablet by mouth daily.      . chlorhexidine (PERIDEX) 0.12 % solution Rinse with 15 mls twice daily for 30 seconds. Use after breakfast and at bedtime. Spit out excess. Do not swallow.  960 mL  3  . clopidogrel (PLAVIX) 75 MG tablet Take 1 tablet (75 mg total) by mouth daily with breakfast.      . hydrochlorothiazide (HYDRODIURIL) 25 MG tablet Take 1 tablet (25 mg total) by mouth daily.  30 tablet  5  . lisinopril (PRINIVIL,ZESTRIL) 10 MG tablet Take 1 tablet (10 mg total) by mouth daily.  30 tablet  2  . Multiple Vitamin (MULTIVITAMIN WITH MINERALS) TABS tablet Take 1 tablet by mouth daily.      Marland Kitchen tiotropium (SPIRIVA HANDIHALER) 18 MCG inhalation capsule Place 1 capsule (18 mcg total) into inhaler and inhale daily.  30 capsule  5    Allergies: Allergies as of 11/23/2013 - Review Complete 11/23/2013  Allergen Reaction Noted  . Aspirin Hives 10/18/2012  . Penicillins Hives 10/18/2012   Past Medical History  Diagnosis Date  . Hypertension 05/18/2007  . COPD 05/18/2007  . Elevated PSA 05/25/2007  . 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 Stroke 12/05/2012  . Prostate  cancer    Past Surgical History  Procedure Laterality Date  . 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 Multiple extractions with alveoloplasty N/A 10/23/2013    Procedure: Extraction of tooth #'s 1,2,3,4,5,6,7,11,12,13,14,15,16,20,21,22,23,24,27,28,29,32 with alveoloplasty and bilateral maxillary tuberosity reductions;  Surgeon: Lenn Cal, DDS;  Location: [place];  Service: Oral Surgery;  Laterality: N/A;  . Leg surgery      pin  right leg   Family History  Problem Relation Age of Onset  . Liver disease Mother   . Heart attack Mother 33  . Colon cancer Neg Hx    History   Social History  . Marital Status: Single    Spouse Name: N/A    Number of Children: 0  . Years of Education: N/A   Occupational History  . Flooring instalation     Retired   Social History Main  Topics  . Smoking status: Current Every Day Smoker -- 0.50 packs/day for 50 years    Types: Cigarettes  . Smokeless tobacco: Never Used     Comment: 1/2 ppd, tobacco info given 10/16/13  . Alcohol Use: 7.0 - 10.5 oz/week    14-21 drink(s) per week     Comment: 2-3 beers per day  . Drug Use: No     Comment: history of cocaine use  . Sexual Activity: Not Currently   Other Topics Concern  . Not on file   Social History Narrative   Drinks 1/2 pt of wine daily and beer daily (at least 12 oz)   Denies recreational drugs    Smokes 3-4 cigarettes. 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    2 years of college at [employer] per patient           Review of Systems: Pertinent items are noted in HPI.  Physical Exam: Blood pressure 124/72, pulse 85, temperature 98.7 F (37.1 C), temperature source Oral, resp. rate 20, weight 74.844 kg (165 lb), SpO2 100.00%.  General: Resting comfortably in bed in no acute distress HEENT: PEERL, MMM, slight clouding of both lenses around the peripheral rim, left sided mouth and eye lid droop - CN strength intact bilaterally Neck: Supple, no lymphadenopathy Cardio: RRR, No M/R/G Pulm: Lungs clear to auscultation bilaterally Abdominal: Soft, non-tender, normal abdominal bowel sounds Extremities: 2+ pedal and radial pulses, no edema, diminished deep tendon reflexes bilaterally appreciated in patellar and bicep. Negative babinskis. Large 2 1/2 inch soft, non-tender, cyst appreciated under left patella anterior tibia. Left lower extremity strength 3+, Right lower extremity strength 5, Left upper extremity strength 4, Right upper extremity strength 5, decreased sensory and pain on left lower extremities, equal sensory of upper extremities bilaterally Neuro: Alert and Oriented x3, Cranial nerves III-XII intact with diminished strength of right sternocleidomastoid when turning his head to the left. Speech is slurred.    Lab results: @LABTEST @  Imaging results:  Ct Head Wo Contrast  11/23/2013   CLINICAL DATA:  Code stroke.  Left-sided weakness.  EXAM: CT HEAD WITHOUT CONTRAST  TECHNIQUE: Contiguous axial images were obtained from the base of the skull through the vertex without contrast.  COMPARISON:  CT head 10/31/2013.  MR head 11/01/2013.  FINDINGS: No evidence for acute infarction, hemorrhage, mass lesion, hydrocephalus, or extra-axial fluid. Premature for age cerebral and cerebellar atrophy. Chronic right cerebellar infarct. Chronic left parietal infarct. Chronic medial right frontal infarct. No definite acute cerebral ischemia. Chronic microvascular ischemic change. Calvarium  intact. Vascular calcification. No CT signs of proximal vascular thrombosis.  IMPRESSION: Chronic changes as described. There is little apparent progression of disease when compared with prior CT or MR.  Critical Value/emergent results were called by telephone at the time of interpretation on 11/23/2013 at 2:59 PM to Dr. Stroke neurologist, who verbally acknowledged these results.   Electronically Signed   By: Rolla Flatten M.D.   On: 11/23/2013 15:00   EKG Interpretation  Date/Time: Thursday November 23 2013 15:03:22 EDT  Ventricular Rate: 84  PR Interval: 184  QRS Duration: 126  QT Interval: 408  QTC Calculation: 482  R Axis: -29  Text Interpretation: Sinus rhythm IVCD, consider atypical RBBB No  significant change since last tracing Confirmed by GOLDSTON MD, SCOTT  ([id number]) on 11/23/2013 3:54:23 PM   Other results:  Results for orders placed during the hospital encounter of 11/23/13 (from the past 24 hour(s))  ETHANOL     Status: None   Collection Time    11/23/13  2:41 PM      Result Value Ref Range   Alcohol, Ethyl (B) <11  0 - 11 mg/dL  PROTIME-INR     Status: None   Collection Time    11/23/13  2:41 PM      Result Value Ref Range   Prothrombin Time 13.7  11.6 - 15.2 seconds   INR 1.07  0.00 - 1.49  APTT     Status: None    Collection Time    11/23/13  2:41 PM      Result Value Ref Range   aPTT 26  24 - 37 seconds  CBC     Status: Abnormal   Collection Time    11/23/13  2:41 PM      Result Value Ref Range   WBC 6.8  4.0 - 10.5 K/uL   RBC 3.28 (*) 4.22 - 5.81 MIL/uL   Hemoglobin 9.1 (*) 13.0 - 17.0 g/dL   HCT 27.9 (*) 39.0 - 52.0 %   MCV 85.1  78.0 - 100.0 fL   MCH 27.7  26.0 - 34.0 pg   MCHC 32.6  30.0 - 36.0 g/dL   RDW 14.3  11.5 - 15.5 %   Platelets 232  150 - 400 K/uL  DIFFERENTIAL     Status: Abnormal   Collection Time    11/23/13  2:41 PM      Result Value Ref Range   Neutrophils Relative % 38 (*) 43 - 77 %   Neutro Abs 2.6  1.7 - 7.7 K/uL   Lymphocytes Relative 52 (*) 12 - 46 %   Lymphs Abs 3.5  0.7 - 4.0 K/uL   Monocytes Relative 9  3 - 12 %   Monocytes Absolute 0.6  0.1 - 1.0 K/uL   Eosinophils Relative 1  0 - 5 %   Eosinophils Absolute 0.1  0.0 - 0.7 K/uL   Basophils Relative 0  0 - 1 %   Basophils Absolute 0.0  0.0 - 0.1 K/uL  COMPREHENSIVE METABOLIC PANEL     Status: Abnormal   Collection Time    11/23/13  2:41 PM      Result Value Ref Range   Sodium 136 (*) 137 - 147 mEq/L   Potassium 3.5 (*) 3.7 - 5.3 mEq/L   Chloride 98  96 - 112 mEq/L   CO2 15 (*) 19 - 32 mEq/L   Glucose, Bld 117 (*) 70 - 99 mg/dL   BUN 16  6 -  23 mg/dL   Creatinine, Ser 1.11  0.50 - 1.35 mg/dL   Calcium 10.1  8.4 - 10.5 mg/dL   Total Protein 8.0  6.0 - 8.3 g/dL   Albumin 3.4 (*) 3.5 - 5.2 g/dL   AST 34  0 - 37 U/L   ALT 30  0 - 53 U/L   Alkaline Phosphatase 71  39 - 117 U/L   Total Bilirubin 0.2 (*) 0.3 - 1.2 mg/dL   GFR calc non Af Amer 68 (*) >90 mL/min   GFR calc Af Amer 79 (*) >90 mL/min  I-STAT CHEM 8, ED     Status: Abnormal   Collection Time    11/23/13  2:48 PM      Result Value Ref Range   Sodium 138  137 - 147 mEq/L   Potassium 3.3 (*) 3.7 - 5.3 mEq/L   Chloride 104  96 - 112 mEq/L   BUN 14  6 - 23 mg/dL   Creatinine, Ser 1.20  0.50 - 1.35 mg/dL   Glucose, Bld 115 (*) 70 - 99 mg/dL    Calcium, Ion 1.24  1.13 - 1.30 mmol/L   TCO2 17  0 - 100 mmol/L   Hemoglobin 9.9 (*) 13.0 - 17.0 g/dL   HCT 29.0 (*) 39.0 - 52.0 %  I-STAT TROPOININ, ED     Status: None   Collection Time    11/23/13  2:51 PM      Result Value Ref Range   Troponin i, poc 0.00  0.00 - 0.08 ng/mL   Comment 3           I-STAT CG4 LACTIC ACID, ED     Status: Abnormal   Collection Time    11/23/13  3:51 PM      Result Value Ref Range   Lactic Acid, Venous 3.37 (*) 0.5 - 2.2 mmol/L     Assessment & Plan by Problem: [name] is a 63 year old man presenting with worsening left-sided weakness with significant past medical history of stroke on 11/01/13, HTN, COPD, and prostate cancer. Home medication includes plavix.   Active Problems:   CVA (cerebral vascular accident)  #Recurrent Ischemic stroke: [name] has a history of right ACA stroke on 11/01/13 and is currently on plavix. He has residual symptoms of left arm and leg weakness and difficulty walking from this past incident. CT on admission showed chronic changes without progression from prior CT. EKG shows sinus rhythm IVCD with no changes since last tracing. On admission he noted involuntary movements of his RLE and LUE.  He is currently on plavix 75 mg daily (Aspirin allergy).  - MRA of brain without contrast - Obtain Urine tox, UA, cardiac monitoring - Neuro checks q2h for 12 hours then q4h - PT and OT evaluation and treatment  #Hypertension- On HCTZ 25 mg daily and Lisinopril 10 mg daily at home. BP have been stable since admission.   - hold home medications  #Mild Acute Kidney Injury- Admission Cr 1.11 --> 1.20, baseline ~1.0. Patient given NS at 100 cc/hr.  - Trend Bmet  #COPD- Stable, lungs clear to auscultation bilaterally, no concern for acute exacerbation at this time.  -continue Spiriva daily, Albuterol prn   #Prostate Cancer - Patient following with Dr. Janice Norrie. Follow-up outpatient.   This is a Careers information officer Note.  The care of the  patient was discussed with Dr. Mechele Claude and the assessment and plan was formulated with their assistance.  Please see their note for  official documentation of the patient encounter.   Signed: Durene Fruits, Med Student 11/23/2013, 4:31 PM

## 2013-11-23 NOTE — ED Notes (Signed)
Admitting MD at bedside.  Pt remains alert and oriented x's 3, skin warm and dry, color appropriate. No complaints voiced.

## 2013-11-23 NOTE — ED Notes (Signed)
Transporting patient to new room assignment. 

## 2013-11-23 NOTE — Consult Note (Signed)
Referring Physician: Verta Ellen    Chief Complaint: left facial droop and dysarthria  HPI:                                                                                                                                         [name] is an 63 y.o. male who recently had a CVA on November 01 2013 (<3 months since today).  Patient lives at a nursing home and was last seen normal at 14:10.  He was heard thumping his foot on the ground --when his friends came to see what was going on he was noted to have a left facial droop and left sided weakness. EMS was called and on arrival he was noted to be unable to place wight on his left leg and unable to hold his left arm antigravity. On arrival to ED he was showing increased strength of his left leg and arm.  He continued to show a left facial droop.   Date last known well: Date: 11/23/2013 Time last known well: Time: 14:10 tPA Given: No: recent stroke   Past Medical History  Diagnosis Date  . Hypertension 05/18/2007  . COPD 05/18/2007  . Elevated PSA 05/25/2007  . 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 Stroke 12/05/2012  . Prostate cancer     Past Surgical History  Procedure Laterality Date  . 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 Multiple extractions with alveoloplasty N/A 10/23/2013    Procedure: Extraction of tooth #'s 1,2,3,4,5,6,7,11,12,13,14,15,16,20,21,22,23,24,27,28,29,32 with alveoloplasty and bilateral maxillary tuberosity reductions;  Surgeon: Lenn Cal, DDS;  Location: [facility];  Service: Oral Surgery;  Laterality: N/A;  . Leg surgery      pin  right leg    Family History  Problem Relation Age of Onset  . Liver disease Mother   . Heart attack Mother 53  . Colon cancer Neg Hx    Social History:  reports that he has been smoking Cigarettes.  He has a 25 pack-year smoking history. He has never used smokeless tobacco. He reports that he drinks about 7 - 10.5 ounces of alcohol per week. He reports that he does not  use illicit drugs.  Allergies:  Allergies  Allergen Reactions  . Aspirin Hives  . Penicillins Hives    Medications:  No current facility-administered medications for this encounter.   Current Outpatient Prescriptions  Medication Sig Dispense Refill  . albuterol (PROVENTIL HFA;VENTOLIN HFA) 108 (90 BASE) MCG/ACT inhaler Inhale 2 puffs into the lungs every 6 (six) hours as needed for wheezing.  8.5 g  3  . atorvastatin (LIPITOR) 80 MG tablet Take 1 tablet (80 mg total) by mouth daily at 6 PM.  30 tablet  5  . CALCIUM PO Take 1 tablet by mouth daily.      . chlorhexidine (PERIDEX) 0.12 % solution Rinse with 15 mls twice daily for 30 seconds. Use after breakfast and at bedtime. Spit out excess. Do not swallow.  960 mL  3  . clopidogrel (PLAVIX) 75 MG tablet Take 1 tablet (75 mg total) by mouth daily with breakfast.      . hydrochlorothiazide (HYDRODIURIL) 25 MG tablet Take 1 tablet (25 mg total) by mouth daily.  30 tablet  5  . lisinopril (PRINIVIL,ZESTRIL) 10 MG tablet Take 1 tablet (10 mg total) by mouth daily.  30 tablet  2  . Multiple Vitamin (MULTIVITAMIN WITH MINERALS) TABS tablet Take 1 tablet by mouth daily.      Marland Kitchen tiotropium (SPIRIVA HANDIHALER) 18 MCG inhalation capsule Place 1 capsule (18 mcg total) into inhaler and inhale daily.  30 capsule  5     ROS:                                                                                                                                       History obtained from the patient  General ROS: negative for - chills, fatigue, fever, night sweats, weight gain or weight loss Psychological ROS: negative for - behavioral disorder, hallucinations, memory difficulties, mood swings or suicidal ideation Ophthalmic ROS: negative for - blurry vision, double vision, eye pain or loss of vision ENT ROS: negative for - epistaxis,  nasal discharge, oral lesions, sore throat, tinnitus or vertigo Allergy and Immunology ROS: negative for - hives or itchy/watery eyes Hematological and Lymphatic ROS: negative for - bleeding problems, bruising or swollen lymph nodes Endocrine ROS: negative for - galactorrhea, hair pattern changes, polydipsia/polyuria or temperature intolerance Respiratory ROS: negative for - cough, hemoptysis, shortness of breath or wheezing Cardiovascular ROS: negative for - chest pain, dyspnea on exertion, edema or irregular heartbeat Gastrointestinal ROS: negative for - abdominal pain, diarrhea, hematemesis, nausea/vomiting or stool incontinence Genito-Urinary ROS: negative for - dysuria, hematuria, incontinence or urinary frequency/urgency Musculoskeletal ROS: negative for - joint swelling or muscular weakness Neurological ROS: as noted in HPI Dermatological ROS: negative for rash and skin lesion changes  Neurologic Examination:  There were no vitals taken for this visit.   Mental Status: Alert, oriented, thought content appropriate.  Speech dysarthric without evidence of aphasia.  Able to follow 3 step commands without difficulty. Cranial Nerves: II: Discs flat bilaterally; Visual fields grossly normal, pupils equal, round, reactive to light and accommodation III,IV, VI: ptosis not present, extra-ocular motions intact bilaterally V,VII: smile asymmetric on the left, facial light touch sensation decreased on the left VIII: hearing normal bilaterally IX,X: gag reflex present XI: bilateral shoulder shrug XII: midline tongue extension without atrophy or fasciculations  Motor: Right : Upper extremity   5/5    Left:     Upper extremity   4/5  Lower extremity   5/5     Lower extremity   4/5 -with drift Tone and bulk:normal tone throughout; no atrophy noted Sensory: Pinprick and light touch decreased on the  left Deep Tendon Reflexes:  Right: Upper Extremity   Left: Upper extremity   biceps (C-5 to C-6) 2/4   biceps (C-5 to C-6) 2/4 tricep (C7) 2/4    triceps (C7) 2/4 Brachioradialis (C6) 2/4  Brachioradialis (C6) 2/4  Lower Extremity Lower Extremity  quadriceps (L-2 to L-4) 1/4   quadriceps (L-2 to L-4) 1/4 Achilles (S1) 0/4   Achilles (S1) 0/4  Plantars: Right: downgoing   Left: mute Cerebellar: normal finger-to-nose,   heel-to-shin test weak on the left but no ataxia Gait: unable to test due to multiple leads.  CV: pulses palpable throughout    Lab Results: Basic Metabolic Panel:  Recent Labs Lab 11/23/13 1448  NA 138  K 3.3*  CL 104  GLUCOSE 115*  BUN 14  CREATININE 1.20    Liver Function Tests: No results found for this basename: AST, ALT, ALKPHOS, BILITOT, PROT, ALBUMIN,  in the last 168 hours No results found for this basename: LIPASE, AMYLASE,  in the last 168 hours No results found for this basename: AMMONIA,  in the last 168 hours  CBC:  Recent Labs Lab 11/23/13 1441 11/23/13 1448  WBC 6.8  --   NEUTROABS 2.6  --   HGB 9.1* 9.9*  HCT 27.9* 29.0*  MCV 85.1  --   PLT 232  --     Cardiac Enzymes: No results found for this basename: CKTOTAL, CKMB, CKMBINDEX, TROPONINI,  in the last 168 hours  Lipid Panel: No results found for this basename: CHOL, TRIG, HDL, CHOLHDL, VLDL, LDLCALC,  in the last 168 hours  CBG: No results found for this basename: GLUCAP,  in the last 168 hours  Microbiology: Results for orders placed during the hospital encounter of 10/24/13  CULTURE, BLOOD (ROUTINE X 2)     Status: None   Collection Time    10/24/13  6:45 PM      Result Value Ref Range Status   Specimen Description BLOOD LEFT ARM   Final   Special Requests BOTTLES DRAWN AEROBIC AND ANAEROBIC 5CC   Final   Culture  Setup Time     Final   Value: 10/25/2013 01:02     Performed at [facility]   Culture     Final   Value: NO GROWTH 5 DAYS     Performed  at [facility]   Report Status 10/31/2013 FINAL   Final  CULTURE, BLOOD (ROUTINE X 2)     Status: None   Collection Time    10/24/13  7:17 PM      Result Value Ref Range Status   Specimen Description  BLOOD RIGHT FOREARM   Final   Special Requests BOTTLES DRAWN AEROBIC AND ANAEROBIC 5CC   Final   Culture  Setup Time     Final   Value: 10/25/2013 01:04     Performed at [facility]   Culture     Final   Value: NO GROWTH 5 DAYS     Performed at [facility]   Report Status 10/31/2013 FINAL   Final  URINE CULTURE     Status: None   Collection Time    10/25/13  4:10 AM      Result Value Ref Range Status   Specimen Description URINE, CLEAN CATCH   Final   Special Requests Normal   Final   Culture  Setup Time     Final   Value: 10/25/2013 08:05     Performed at [facility]     Final   Value: >=100,000 COLONIES/ML     Performed at [facility]   Culture     Final   Value: Multiple bacterial morphotypes present, none predominant. Suggest appropriate recollection if clinically indicated.     Performed at [facility]   Report Status 10/26/2013 FINAL   Final  GRAM STAIN     Status: None   Collection Time    10/25/13  4:11 AM      Result Value Ref Range Status   Specimen Description URINE, CLEAN CATCH   Final   Special Requests Normal   Final   Gram Stain     Final   Value: [id number]     WBC PRESENT,BOTH PMN AND MONONUCLEAR     Multiple bacterial morphotypes present, none predominant. Suggest appropriate recollection if clinically indicated.     YEAST     Results Called toZelphia Cairo RN [phone] Perlie Mayo   Report Status 10/25/2013 FINAL   Final    Coagulation Studies: No results found for this basename: LABPROT, INR,  in the last 72 hours  Imaging: Ct Head Wo Contrast  11/23/2013   CLINICAL DATA:  Code stroke.  Left-sided weakness.  EXAM: CT HEAD WITHOUT CONTRAST  TECHNIQUE: Contiguous axial images were obtained  from the base of the skull through the vertex without contrast.  COMPARISON:  CT head 10/31/2013.  MR head 11/01/2013.  FINDINGS: No evidence for acute infarction, hemorrhage, mass lesion, hydrocephalus, or extra-axial fluid. Premature for age cerebral and cerebellar atrophy. Chronic right cerebellar infarct. Chronic left parietal infarct. Chronic medial right frontal infarct. No definite acute cerebral ischemia. Chronic microvascular ischemic change. Calvarium intact. Vascular calcification. No CT signs of proximal vascular thrombosis.  IMPRESSION: Chronic changes as described. There is little apparent progression of disease when compared with prior CT or MR.  Critical Value/emergent results were called by telephone at the time of interpretation on 11/23/2013 at 2:59 PM to Dr. Stroke neurologist, who verbally acknowledged these results.   Electronically Signed   By: Rolla Flatten M.D.   On: 11/23/2013 15:00    Recent studies 11/01/13  Carotid doppler: Summary:  - The vertebral arteries appear patent with antegrade flow. - Findings consistent with 1-39 percent stenosis involving the right internal carotid artery and the left internal carotid artery.  Echo: - Left ventricle: The cavity size was normal. There was moderate concentric hypertrophy. Systolic function was vigorous. The estimated ejection fraction was in the range of 65% to 70%. Wall motion was normal; there were no regional wall motion abnormalities. Doppler parameters are  consistent with abnormal left ventricular relaxation (grade 1 diastolic dysfunction). The E/e' ratio is <10, suggesting normal LV filling pressure. - Mitral valve: Mildly thickened leaflets . No significant regurgitation. - Atrial septum: No defect or patent foramen ovale was identified. - Inferior vena cava: The vessel was normal in size; the respirophasic diameter changes were in the normal range (= 50%); findings are consistent with normal central  venous Pressure.  LDL--40 A1c--5.6     Assessment and plan discussed with with attending physician and they are in agreement.    Terry Quill PA-C [facility] [phone]  11/23/2013, 3:04 PM   Assessment: 63 y.o. male presenting to ED with new onset left facial asymmetry and left left leg weakness. Likely acute right MCA infarct.  Patient was not a tPA candidate due to recent right MCA infarct less than one month prior. He has severe intracranial atherosclerosis on previous MRA, will re-evaluate.   Stroke Risk Factors - hypertension  Recommend: 1) MRI brain to evaluate for acute CVA.  2) MR angiogram head 3) PT, OT, ST  Roland Rack, MD [facility] [phone]  If 7pm- 7am, please page neurology on call as listed in [place].

## 2013-11-23 NOTE — H&P (Signed)
Date: 11/23/2013               Patient Name:  [name] MRN: [id number]  DOB: [birth year] Age / Sex: 63 y.o., male   PCP: Corky Sox, MD         Medical Service: Internal Medicine Teaching Service         Attending Physician: Dr. Madilyn Fireman, MD    First Contact: Dr. Mechele Claude Pager: [phone]  Second Contact: Dr. Eulas Post Pager: [phone]       After Hours (After 5p/  First Contact Pager: [phone]  weekends / holidays): Second Contact Pager: [phone]   Chief Complaint: weakness  History of Present Illness:  The patient is a [name] with recent R ACA stroke (March 2015) coming in with worsening L sided weakness. He noticed the weakness right around 2 o'clock and alerted his roommate's attention to get himself help. He was brought to the hospital and evaluated. He denies any new problems except the weakness. He denies change in his speech or hearing or vision. He denies blurred or double vision. He denies SOB, cough, chest pain, nausea, vomiting, diarrhea, abdominal pain, dysuria, constipation. He is also denying recent fall and states that he gets around with walker and wheelchair at home with some help from his roommate. He denies fevers or chills and is not having any pain. He also has PMH of HTN, COPD, elevated PSA, history of drug use (but denies recent usage of cocaine).   Meds: No current facility-administered medications for this encounter.   Current Outpatient Prescriptions  Medication Sig Dispense Refill  . albuterol (PROVENTIL HFA;VENTOLIN HFA) 108 (90 BASE) MCG/ACT inhaler Inhale 2 puffs into the lungs every 6 (six) hours as needed for wheezing.  8.5 g  3  . atorvastatin (LIPITOR) 80 MG tablet Take 1 tablet (80 mg total) by mouth daily at 6 PM.  30 tablet  5  . CALCIUM PO Take 1 tablet by mouth daily.      . chlorhexidine (PERIDEX) 0.12 % solution Rinse with 15 mls twice daily for 30 seconds. Use after breakfast and at bedtime. Spit out excess. Do not swallow.  960 mL   3  . clopidogrel (PLAVIX) 75 MG tablet Take 1 tablet (75 mg total) by mouth daily with breakfast.      . hydrochlorothiazide (HYDRODIURIL) 25 MG tablet Take 1 tablet (25 mg total) by mouth daily.  30 tablet  5  . lisinopril (PRINIVIL,ZESTRIL) 10 MG tablet Take 1 tablet (10 mg total) by mouth daily.  30 tablet  2  . Multiple Vitamin (MULTIVITAMIN WITH MINERALS) TABS tablet Take 1 tablet by mouth daily.      Marland Kitchen tiotropium (SPIRIVA HANDIHALER) 18 MCG inhalation capsule Place 1 capsule (18 mcg total) into inhaler and inhale daily.  30 capsule  5    Allergies: Allergies as of 11/23/2013 - Review Complete 11/23/2013  Allergen Reaction Noted  . Aspirin Hives 10/18/2012  . Penicillins Hives 10/18/2012   Past Medical History  Diagnosis Date  . Hypertension 05/18/2007  . COPD 05/18/2007  . Elevated PSA 05/25/2007  . 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 Stroke 12/05/2012  . Prostate cancer    Past Surgical History  Procedure Laterality Date  . 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 Multiple extractions with alveoloplasty N/A 10/23/2013    Procedure: Extraction of tooth #'s 1,2,3,4,5,6,7,11,12,13,14,15,16,20,21,22,23,24,27,28,29,32 with alveoloplasty and bilateral maxillary tuberosity reductions;  Surgeon: Lenn Cal,  DDS;  Location: [facility];  Service: Oral Surgery;  Laterality: N/A;  . Leg surgery      pin  right leg   Family History  Problem Relation Age of Onset  . Liver disease Mother   . Heart attack Mother 60  . Colon cancer Neg Hx    History   Social History  . Marital Status: Single    Spouse Name: N/A    Number of Children: 0  . Years of Education: N/A   Occupational History  . Flooring instalation     Retired   Social History Main Topics  . Smoking status: Current Every Day Smoker -- 0.50 packs/day for 50 years    Types: Cigarettes  . Smokeless tobacco: Never Used     Comment: 1/2 ppd, tobacco info given 10/16/13  . Alcohol Use: 7.0 - 10.5 oz/week     14-21 drink(s) per week     Comment: 2-3 beers per day  . Drug Use: No     Comment: history of cocaine use  . Sexual Activity: Not Currently   Other Topics Concern  . Not on file   Social History Narrative   Drinks 1/2 pt of wine daily and beer daily (at least 12 oz)   Denies recreational drugs    Smokes 3-4 cigarettes. 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    2 years of college at [employer] per patient           Review of Systems: A comprehensive 10 point review of systems was performed and other than pertinent positives and negatives listed above in HPI was negative.   Physical Exam: Blood pressure 132/74, pulse 79, temperature 98.7 F (37.1 C), temperature source Oral, resp. rate 17, weight 165 lb (74.844 kg), SpO2 100.00%. General: resting in bed, in no distress HEENT: PERRL, EOMI, no scleral icterus Cardiac: S1 S2 normal Pulm: clear to auscultation bilaterally, moving normal volumes of air Abd: soft, nontender, nondistended, BS present Ext: warm and well perfused, no pedal edema Neuro: alert and oriented X3, cranial nerves II-XII grossly intact, dysarthric and with facial assymetry without aphasia, tongue midline without deviation. Can follow 3 step command, light touch decreased left face, arm, leg. Left arm with 4+/5 strength, right arm with 4-5/5 strength, left leg 4/5 strength, right leg 4-5/5 strength. Light touch intact right face, leg, arm.   Lab results: Basic Metabolic Panel:  Recent Labs  11/23/13 1441 11/23/13 1448  NA 136* 138  K 3.5* 3.3*  CL 98 104  CO2 15*  --   GLUCOSE 117* 115*  BUN 16 14  CREATININE 1.11 1.20  CALCIUM 10.1  --    Liver Function Tests:  Recent Labs  11/23/13 1441  AST 34  ALT 30  ALKPHOS 71  BILITOT 0.2*  PROT 8.0  ALBUMIN 3.4*   CBC:  Recent Labs  11/23/13 1441 11/23/13 1448  WBC 6.8  --   NEUTROABS 2.6  --   HGB 9.1* 9.9*  HCT 27.9* 29.0*  MCV 85.1  --     PLT 232  --    Coagulation:  Recent Labs  11/23/13 1441  LABPROT 13.7  INR 1.07   Alcohol Level:  Recent Labs  11/23/13 1441  ETH <11   Lactate 3.37 (high)  Imaging results:  Ct Head Wo Contrast  11/23/2013   CLINICAL DATA:  Code stroke.  Left-sided weakness.  EXAM: CT HEAD WITHOUT CONTRAST  TECHNIQUE: Contiguous axial images were obtained from the base of the skull through the vertex without contrast.  COMPARISON:  CT head 10/31/2013.  MR head 11/01/2013.  FINDINGS: No evidence for acute infarction, hemorrhage, mass lesion, hydrocephalus, or extra-axial fluid. Premature for age cerebral and cerebellar atrophy. Chronic right cerebellar infarct. Chronic left parietal infarct. Chronic medial right frontal infarct. No definite acute cerebral ischemia. Chronic microvascular ischemic change. Calvarium intact. Vascular calcification. No CT signs of proximal vascular thrombosis.  IMPRESSION: Chronic changes as described. There is little apparent progression of disease when compared with prior CT or MR.  Critical Value/emergent results were called by telephone at the time of interpretation on 11/23/2013 at 2:59 PM to Dr. Stroke neurologist, who verbally acknowledged these results.   Electronically Signed   By: Rolla Flatten M.D.   On: 11/23/2013 15:00   Other results: EKG: normal EKG, normal sinus rhythm, unchanged from previous tracings.  Assessment & Plan by Problem:  CVA (cerebral vascular accident) - Possible acute right MCA stroke with new left sided weakness. Unclear if the facial assymetry is new but the patient denies that he is talking any different than usual. Neurology evaluated in the ED but he was not a candidate for tPA due to recent stroke within 3 months. They are recommending MRI brain to see if he does have acute stroke (CT negative). Will not repeat stroke workup as he has had it within one month. Last work up is as follows (10/2013): MRI showed acute to subacute right ACA  infarct affecting medial frontal lobe (no hemorrhage or mass effect); Bilateral carotid dopplers negative. Transcranial dopplers negative. Echo from 11/2012 showed EF 65% w/ grade 1 diastolic dysfunction; risk stratification: A1C 5.6%, LDL 40. EEG negative for seizure. He was taking plavix, lipitor, BP meds at home.  -Admit to tele -MRI brain -PT/OT/SLP -NPO for now until passes swallow evaluation -neuro checks q2h -UDS -UA -IVF NS w/ KCl @100cc /hr -CMP in AM  Hypertension - Will hold BP meds for permissive hypertension if he has had acute stroke. He was on lisinopril, HCTZ at home.  -monitor BP and prn meds for SBP > 180 or DBP > 110  COPD - not having any breathing problems currently. Will hold spiriva until swallowing evaluated and use prn medication if needed. Currently in no acute respiratory distress.   Smokes tobacco daily - Patient advised to quit.   Dyslipidemia - Recent lipid panel shows LDL 40 and total cholesterol 95. Will resume lipitor when able to swallow.   DVT ppx - lovenox Izard daily  Dispo: Disposition is deferred at this time, awaiting improvement of current medical problems. Anticipated discharge in approximately 1-2 day(s).   The patient does have a current PCP Corky Sox, MD) and does need an [facility] hospital follow-up appointment after discharge.  The patient does not have transportation limitations that hinder transportation to clinic appointments.  Signed: Olga Millers, MD 11/23/2013, 4:51 PM

## 2013-11-23 NOTE — ED Notes (Signed)
Pt in via EMS from home, was LSN at 1410, pt had previously been seen by home health nurse, later found by roommate after stomping his foot on the floor to get his attention, pt had developed left sided weakness and slurred speech, alert and follows commands, answers questions, pt with recent history of stroke in March with deficits on the left side but symptoms were worse today, also had tooth extraction on 3/16, IV started PTA 20g in RAC, CBG 175, BP 141/55

## 2013-11-23 NOTE — ED Provider Notes (Signed)
CSN: [id number]     Arrival date & time 11/23/13  1439 History   First MD Initiated Contact with Patient 11/23/13 1501     Chief Complaint  Patient presents with  . Code Stroke    An emergency department physician performed an initial assessment on this suspected stroke patient at 1442. (Consider location/radiation/quality/duration/timing/severity/associated sxs/prior Treatment) HPI 63 year old male presents with worsening left-sided weakness. He had a stroke on 11/01/13 and had left-sided weakness at that time. He was last seen normal at 1410, approximately 30 minutes prior to coming to the ER. The patient is currently on Plavix. According to nursing staff from EMS the patient was last seen normal by his home health nurse. His roommate later found him stomping on the ground indicating that he needed help. At this time as noted that he was more weak than normal and he was sent in the ER.  Past Medical History  Diagnosis Date  . Hypertension 05/18/2007  . COPD 05/18/2007  . Elevated PSA 05/25/2007  . 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 Stroke 12/05/2012  . Prostate cancer    Past Surgical History  Procedure Laterality Date  . 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 Multiple extractions with alveoloplasty N/A 10/23/2013    Procedure: Extraction of tooth #'s 1,2,3,4,5,6,7,11,12,13,14,15,16,20,21,22,23,24,27,28,29,32 with alveoloplasty and bilateral maxillary tuberosity reductions;  Surgeon: Lenn Cal, DDS;  Location: [facility];  Service: Oral Surgery;  Laterality: N/A;  . Leg surgery      pin  right leg   Family History  Problem Relation Age of Onset  . Liver disease Mother   . Heart attack Mother 62  . Colon cancer Neg Hx    History  Substance Use Topics  . Smoking status: Current Every Day Smoker -- 0.50 packs/day for 50 years    Types: Cigarettes  . Smokeless tobacco: Never Used     Comment: 1/2 ppd, tobacco info given 10/16/13  . Alcohol Use: 7.0 - 10.5 oz/week     14-21 drink(s) per week     Comment: 2-3 beers per day    Review of Systems  Constitutional: Negative for fever.  Gastrointestinal: Negative for vomiting.  Neurological: Positive for weakness. Negative for speech difficulty and numbness.  All other systems reviewed and are negative.     Allergies  Aspirin and Penicillins  Home Medications   Prior to Admission medications   Medication Sig Start Date End Date Taking? Authorizing Provider  albuterol (PROVENTIL HFA;VENTOLIN HFA) 108 (90 BASE) MCG/ACT inhaler Inhale 2 puffs into the lungs every 6 (six) hours as needed for wheezing. 10/10/13   Corky Sox, MD  atorvastatin (LIPITOR) 80 MG tablet Take 1 tablet (80 mg total) by mouth daily at 6 PM. 10/10/13   Corky Sox, MD  CALCIUM PO Take 1 tablet by mouth daily.    Historical Provider, MD  chlorhexidine (PERIDEX) 0.12 % solution Rinse with 15 mls twice daily for 30 seconds. Use after breakfast and at bedtime. Spit out excess. Do not swallow. 11/07/13   Lenn Cal, DDS  clopidogrel (PLAVIX) 75 MG tablet Take 1 tablet (75 mg total) by mouth daily with breakfast. 10/25/13   Ivin Poot, MD  hydrochlorothiazide (HYDRODIURIL) 25 MG tablet Take 1 tablet (25 mg total) by mouth daily. 11/09/13   Cresenciano Genre, MD  lisinopril (PRINIVIL,ZESTRIL) 10 MG tablet Take 1 tablet (10 mg total) by mouth daily. 11/09/13   Cresenciano Genre, MD  Multiple Vitamin (MULTIVITAMIN WITH MINERALS) TABS tablet Take 1 tablet by mouth daily.    Historical Provider, MD  tiotropium (SPIRIVA HANDIHALER) 18 MCG inhalation capsule Place 1 capsule (18 mcg total) into inhaler and inhale daily. 10/10/13 10/10/14  Corky Sox, MD   BP 124/72  Pulse 85  Temp(Src) 98.7 F (37.1 C) (Oral)  Resp 20  Wt 165 lb (74.844 kg)  SpO2 100% Physical Exam  Nursing note and vitals reviewed. Constitutional: He is oriented to person, place, and time. He appears well-developed and well-nourished. No distress.  HENT:  Head: Normocephalic  and atraumatic.  Right Ear: External ear normal.  Left Ear: External ear normal.  Nose: Nose normal.  Eyes: EOM are normal. Pupils are equal, round, and reactive to light. Right eye exhibits no discharge. Left eye exhibits no discharge.  Neck: Neck supple.  Cardiovascular: Normal rate, regular rhythm, normal heart sounds and intact distal pulses.   Pulmonary/Chest: Effort normal and breath sounds normal.  Abdominal: Soft. He exhibits no distension. There is no tenderness.  Musculoskeletal: He exhibits no edema.  Neurological: He is alert and oriented to person, place, and time.  Left sided mouth droop. Strength decreased for left sided extremities, leg worse than arm. Right sided strength normal  Skin: Skin is warm and dry.  Psychiatric: His speech is slurred.    ED Course  Procedures (including critical care time) Labs Review Labs Reviewed  CBC - Abnormal; Notable for the following:    RBC 3.28 (*)    Hemoglobin 9.1 (*)    HCT 27.9 (*)    All other components within normal limits  DIFFERENTIAL - Abnormal; Notable for the following:    Neutrophils Relative % 38 (*)    Lymphocytes Relative 52 (*)    All other components within normal limits  COMPREHENSIVE METABOLIC PANEL - Abnormal; Notable for the following:    Sodium 136 (*)    Potassium 3.5 (*)    CO2 15 (*)    Glucose, Bld 117 (*)    Albumin 3.4 (*)    Total Bilirubin 0.2 (*)    GFR calc non Af Amer 68 (*)    GFR calc Af Amer 79 (*)    All other components within normal limits  I-STAT CHEM 8, ED - Abnormal; Notable for the following:    Potassium 3.3 (*)    Glucose, Bld 115 (*)    Hemoglobin 9.9 (*)    HCT 29.0 (*)    All other components within normal limits  I-STAT CG4 LACTIC ACID, ED - Abnormal; Notable for the following:    Lactic Acid, Venous 3.37 (*)    All other components within normal limits  ETHANOL  PROTIME-INR  APTT  URINE RAPID DRUG SCREEN (HOSP PERFORMED)  URINALYSIS, ROUTINE W REFLEX MICROSCOPIC   I-STAT TROPOININ, ED  I-STAT TROPOININ, ED    Imaging Review Ct Head Wo Contrast  11/23/2013   CLINICAL DATA:  Code stroke.  Left-sided weakness.  EXAM: CT HEAD WITHOUT CONTRAST  TECHNIQUE: Contiguous axial images were obtained from the base of the skull through the vertex without contrast.  COMPARISON:  CT head 10/31/2013.  MR head 11/01/2013.  FINDINGS: No evidence for acute infarction, hemorrhage, mass lesion, hydrocephalus, or extra-axial fluid. Premature for age cerebral and cerebellar atrophy. Chronic right cerebellar infarct. Chronic left parietal infarct. Chronic medial right frontal infarct. No definite acute cerebral ischemia. Chronic microvascular ischemic change. Calvarium intact. Vascular calcification. No CT signs of proximal vascular thrombosis.  IMPRESSION:  Chronic changes as described. There is little apparent progression of disease when compared with prior CT or MR.  Critical Value/emergent results were called by telephone at the time of interpretation on 11/23/2013 at 2:59 PM to Dr. Stroke neurologist, who verbally acknowledged these results.   Electronically Signed   By: Rolla Flatten M.D.   On: 11/23/2013 15:00     EKG Interpretation   Date/Time:  Thursday November 23 2013 15:03:22 EDT Ventricular Rate:  84 PR Interval:  184 QRS Duration: 126 QT Interval:  408 QTC Calculation: 482 R Axis:   -29 Text Interpretation:  Sinus rhythm IVCD, consider atypical RBBB No  significant change since last tracing Confirmed by Molena  ([id number]) on 11/23/2013 3:54:23 PM      MDM   Final diagnoses:  CVA (cerebral vascular accident)    Discussed the patient with the neurologist on call, Dr. Leonel Ramsay, who is evaluated the patient. Due to his stroke just a few weeks ago is not a candidate for any intervention. His exam is concerning for a recurrent stroke. Due to this he will need admission to internal medicine teaching service and MRA of head and neck. At this time his airway  and breathing are stable. Blood pressure is controlled.    Ephraim Hamburger, MD 11/23/13 [phone]

## 2013-11-23 NOTE — ED Notes (Signed)
NOTIFIED PHYSICIAN OF PATIENTS LAB RESULTS OF CG4+ LACTIC ACID .11/23/2013.

## 2013-11-23 NOTE — Code Documentation (Signed)
63 year old male presents to [facility] via [facility] as code stroke.  Patient has recent hx in March 2015 of CVA with resultant left sided deficits.  He was seen today by [facility] and all was at his baseline.  Shortly after that his roommate heard him stomping his foot on the floor. LSW 1410.   At that time he was having increased left side weakness and slurred speech.  EMS was called and the code stroke was called in the field.  On arrival he is alert - left facial droop and left leg drift - also with worsening dysarthria - almost unintelligible at times.  He follows commands .  Some left sensory loss.  His NIHSS is 6.  He is exhibiting some coughing which he says is worse today.  Will have speech therapy evaluate - no stroke swallow screen -wet sounding voice.  He had teeth extraction within the last month. No acute intervention.  Dr. Leonel Ramsay present.   Handoff to [employer].

## 2013-11-23 NOTE — ED Notes (Signed)
Pt to MRI at this time   Floor nurse made aware.

## 2013-11-24 ENCOUNTER — Encounter (HOSPITAL_COMMUNITY): Payer: Self-pay | Admitting: General Practice

## 2013-11-24 DIAGNOSIS — R531 Weakness: Secondary | ICD-10-CM | POA: Diagnosis present

## 2013-11-24 DIAGNOSIS — E876 Hypokalemia: Secondary | ICD-10-CM

## 2013-11-24 LAB — URINALYSIS, ROUTINE W REFLEX MICROSCOPIC
Bilirubin Urine: NEGATIVE
GLUCOSE, UA: NEGATIVE mg/dL
Hgb urine dipstick: NEGATIVE
Ketones, ur: NEGATIVE mg/dL
LEUKOCYTES UA: NEGATIVE
Nitrite: NEGATIVE
Protein, ur: NEGATIVE mg/dL
SPECIFIC GRAVITY, URINE: 1.017 (ref 1.005–1.030)
Urobilinogen, UA: 0.2 mg/dL (ref 0.0–1.0)
pH: 5.5 (ref 5.0–8.0)

## 2013-11-24 LAB — RAPID URINE DRUG SCREEN, HOSP PERFORMED
Amphetamines: NOT DETECTED
Barbiturates: NOT DETECTED
Benzodiazepines: NOT DETECTED
Cocaine: NOT DETECTED
OPIATES: NOT DETECTED
Tetrahydrocannabinol: NOT DETECTED

## 2013-11-24 LAB — COMPREHENSIVE METABOLIC PANEL
ALBUMIN: 3.1 g/dL — AB (ref 3.5–5.2)
ALT: 24 U/L (ref 0–53)
AST: 27 U/L (ref 0–37)
Alkaline Phosphatase: 63 U/L (ref 39–117)
BUN: 13 mg/dL (ref 6–23)
CO2: 22 mEq/L (ref 19–32)
CREATININE: 0.97 mg/dL (ref 0.50–1.35)
Calcium: 9.8 mg/dL (ref 8.4–10.5)
Chloride: 106 mEq/L (ref 96–112)
GFR calc Af Amer: >90 mL/min (ref >90)
GFR calc non Af Amer: 85 mL/min — ABNORMAL LOW (ref >90)
Glucose, Bld: 82 mg/dL (ref 70–99)
POTASSIUM: 3.5 meq/L — AB (ref 3.7–5.3)
Sodium: 140 mEq/L (ref 137–147)
TOTAL PROTEIN: 7 g/dL (ref 6.0–8.3)
Total Bilirubin: 0.3 mg/dL (ref 0.3–1.2)

## 2013-11-24 MED ORDER — POTASSIUM CHLORIDE CRYS ER 20 MEQ PO TBCR
40.0000 meq | EXTENDED_RELEASE_TABLET | Freq: Once | ORAL | Status: AC
  Administered 2013-11-24: 40 meq via ORAL
  Filled 2013-11-24: qty 2

## 2013-11-24 MED ORDER — LISINOPRIL 10 MG PO TABS
10.0000 mg | ORAL_TABLET | Freq: Every day | ORAL | Status: DC
  Administered 2013-11-24: 10 mg via ORAL
  Filled 2013-11-24: qty 1

## 2013-11-24 MED ORDER — HYDROCHLOROTHIAZIDE 25 MG PO TABS
25.0000 mg | ORAL_TABLET | Freq: Every day | ORAL | Status: DC
  Administered 2013-11-24: 25 mg via ORAL
  Filled 2013-11-24: qty 1

## 2013-11-24 MED ORDER — CLOPIDOGREL BISULFATE 75 MG PO TABS: 75.0000 mg | ORAL_TABLET | Freq: Every day | ORAL | Status: DC

## 2013-11-24 MED ORDER — ATORVASTATIN CALCIUM 80 MG PO TABS
80.0000 mg | ORAL_TABLET | Freq: Every day | ORAL | Status: DC
  Filled 2013-11-24: qty 1

## 2013-11-24 MED ORDER — TIOTROPIUM BROMIDE MONOHYDRATE 18 MCG IN CAPS
18.0000 ug | ORAL_CAPSULE | Freq: Every day | RESPIRATORY_TRACT | Status: DC
  Filled 2013-11-24: qty 5

## 2013-11-24 NOTE — Progress Notes (Signed)
Subjective:  Mr. Sexson is doing welll today with no significant overnight events. When I entered the room Mr. Halls had just finished using the bathroom and was standing up comfortably. He reports that he has had no more involuntary movements and no change in strength. He reports that he did not sleep well with all of the equipment that he was hooked up to and is having difficulty speaking due to the lack of teeth. He denies chest pain, sob, abdominal pain, constipation, or diarrhea.  Objective: Vital signs in last 24 hours: Filed Vitals:   11/24/13 0001 11/24/13 0200 11/24/13 0400 11/24/13 0625  BP: 164/81 152/69 156/76 138/76  Pulse: 71 71  79  Temp: 98.3 F (36.8 C) 98.4 F (36.9 C) 97.7 F (36.5 C) 98.6 F (37 C)  TempSrc: Oral Oral Oral Oral  Resp: 14 18 18 16   Height:      Weight:      SpO2: 100% 100% 99% 98%   Weight change:   Intake/Output Summary (Last 24 hours) at 11/24/13 0912 Last data filed at 11/24/13 0600  Gross per 24 hour  Intake      0 ml  Output    500 ml  Net   -500 ml   Physical Exam: General: Resting comfortably in bed in no acute distress HEENT: PEERL, MMM, slight left sided mouth and eye lid droop - CN strength intact bilaterally Neck: Supple, no lymphadenopathy Cardio: RRR, No M/R/G Pulm: Lungs clear to auscultation bilaterally Abdominal: Soft, non-tender, normal abdominal bowel sounds GU: No CVA tenderness Extremities: 2+ pedal and radial pulses, no edema diminished deep tendon reflexes bilaterally appreciated in patellar and bicep. Negative babinskis. Large 2 1/2 inch soft, non-tender, cyst appreciated under left patella anterior tibia. Left lower extremity strength 3, Right lower extremity strength 5, Left upper extremity strength 3+, Right upper extremity strength 5, decreased sensory and pain on left lower extremities, equal sensory of upper extremities bilaterally Neuro: Alert and Oriented x3, Cranial nerves III-XII intact with diminished strength  of right sternocleidomastoid when turning his head to the left. Speech is slurred.   Lab Results: @LABTEST2 @ Micro Results: No results found for this or any previous visit (from the past 240 hour(s)). Studies/Results: Ct Head Wo Contrast  11/23/2013   CLINICAL DATA:  Code stroke.  Left-sided weakness.  EXAM: CT HEAD WITHOUT CONTRAST  TECHNIQUE: Contiguous axial images were obtained from the base of the skull through the vertex without contrast.  COMPARISON:  CT head 10/31/2013.  MR head 11/01/2013.  FINDINGS: No evidence for acute infarction, hemorrhage, mass lesion, hydrocephalus, or extra-axial fluid. Premature for age cerebral and cerebellar atrophy. Chronic right cerebellar infarct. Chronic left parietal infarct. Chronic medial right frontal infarct. No definite acute cerebral ischemia. Chronic microvascular ischemic change. Calvarium intact. Vascular calcification. No CT signs of proximal vascular thrombosis.  IMPRESSION: Chronic changes as described. There is little apparent progression of disease when compared with prior CT or MR.  Critical Value/emergent results were called by telephone at the time of interpretation on 11/23/2013 at 2:59 PM to Dr. Stroke neurologist, who verbally acknowledged these results.   Electronically Signed   By: Rolla Flatten M.D.   On: 11/23/2013 15:00   Mr [name] Wo Contrast  11/23/2013   CLINICAL DATA:  Stroke  EXAM: MRA HEAD WITHOUT CONTRAST  TECHNIQUE: Angiographic images of the Circle of Willis were obtained using MRA technique without intravenous contrast.  COMPARISON:  Prior MRI performed earlier on the same day as well  as previous MRA from 12/05/2012.  FINDINGS: Anterior circulation: Normal flow related enhancement of the included cervical, petrous, cavernous and supra clinoid internal carotid arteries. Multi focal atherosclerotic irregularity seen within the cavernous segments of the internal carotid arteries bilaterally, left greater than right. Stenoses within  the cavernous segment of the left internal carotid measure up to 60-70% at its most narrow point (series 3, image 63). Overall, these findings are not significantly changed relative to prior MRA. Patent anterior communicating artery. Patent flow seen within the anterior cerebral and middle cerebral arteries bilaterally. Multi focal irregularity within the M1 and A2 segments bilaterally are grossly stable relative to prior study, likely related to underlying atheromatous disease.  Posterior circulation: Left vertebral artery is dominant and widely patent to the level of the vertebrobasilar junction. There is multi focal irregularity with occlusion of the distal right vertebral artery proximal to the vertebrobasilar junction, unchanged. Posterior inferior cerebellar arteries are within normal limits. Basilar artery is patent, with normal flow related enhancement of the main branch vessels. Normal flow related enhancement of the posterior cerebral arteries.  No large vessel occlusion, hemodynamically significant stenosis, abnormal luminal irregularity, aneurysm within the anterior nor posterior circulation.  IMPRESSION: 1. Multi focal irregularity within the cavernous segments of the internal carotid arteries bilaterally, left greater than right. 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 Overall, these findings are not significantly changed relative to prior MRA from 12/05/2012. 2. Occlusion of the distal right vertebral artery, unchanged. The left vertebral artery is dominant and widely patent to the level of the vertebrobasilar junction. 3. Scattered multi focal atherosclerotic irregularity within the anterior and middle cerebral artery branches bilaterally, left greater than right. Findings are grossly stable relative to prior MRA from 12/05/2012. 4. No intracranial aneurysm   Electronically Signed   By: Jeannine Boga M.D.   On: 11/23/2013  22:46   [name]  11/23/2013   CLINICAL DATA:  Recent right anterior cerebral artery stroke. Worsening left-sided weakness.  EXAM: MRI HEAD WITHOUT CONTRAST  TECHNIQUE: Multiplanar, multiecho pulse sequences of the brain and surrounding structures were obtained without intravenous contrast.  COMPARISON:  Head CT same day.  MRI 11/01/2013.  FINDINGS: Diffusion imaging does not show any acute infarction. Changes related to subacute infarction in the right anterior cerebral artery territory are evident. No evidence of extension of the infarction.  Old cerebellar infarction on the right appears the same. Micro hemorrhages in the cerebellum appear the same and are chronic. Absent flow in the right vertebral artery again noted. Old infarction affects the right lateral medulla.  The cerebral hemispheres elsewhere show chronic small vessel disease within the white matter. Few scattered micro hemorrhages are unchanged. These are old. Old infarction in the left basal ganglia/ external capsule region. Old small vessel thalamic infarctions. No mass lesion, acute hemorrhage, hydrocephalus or extra-axial collection. No pituitary mass. Chronic opacification of the left maxillary sinus again noted.  IMPRESSION: No evidence of new infarction or extension of the anterior cerebral artery infarction on the right. Subacute infarction in that vascular territory.  Old right cerebellar and right lateral medulla infarction. Chronic small-vessel changes elsewhere throughout the brain.   Electronically Signed   By: Nelson Chimes M.D.   On: 11/23/2013 18:53   Medications:  Scheduled: . enoxaparin (LOVENOX) injection  40 mg Subcutaneous Q24H  . hydrochlorothiazide  25 mg Oral Daily  . lisinopril  10 mg Oral Daily  .  sodium chloride  3 mL Intravenous Q12H   Continuous:  PRN: Scheduled Meds: . enoxaparin (LOVENOX) injection  40 mg Subcutaneous Q24H  . hydrochlorothiazide  25 mg Oral Daily  . lisinopril  10 mg Oral  Daily  . sodium chloride  3 mL Intravenous Q12H   Continuous Infusions:  PRN Meds:. Assessment/Plan:Mr. Reppert is a 63 year old man presenting with worsening left-sided weakness with significant past medical history of stroke on 11/01/13, HTN, COPD, and prostate cancer. Home medication includes plavix.  Principal Problem:   CVA (cerebral vascular accident) Active Problems:   Hypertension   COPD   Smokes tobacco daily   Dyslipidemia  #Recurrent Ischemic stroke: Mr. Crill has a history of right ACA stroke on 11/01/13 and is currently on plavix. He has residual symptoms of left arm and leg weakness and difficulty walking from this past incident. CT on admission showed chronic changes without progression from prior CT. EKG shows sinus rhythm IVCD with no changes since last tracing. On admission he noted involuntary movements of his RLE and LUE. He is currently on plavix 75 mg daily (Aspirin allergy). MRA of brain without contrast showed no acute changes. UA negative. Urine tox screen negative.  - Neuro checks q4h  - PT and OT evaluation and treatment  - Cardiac monitoring  #Hypertension- On HCTZ 25 mg daily and Lisinopril 10 mg daily at home. BP have been stable since admission.  - Continue Hydrochlorothiazide and Lisinopril  #Mild Acute Kidney Injury- Admission Cr 1.11 --> 1.20, baseline ~1.0. Patient given NS at 100 cc/hr.  - Trend Bmet   #COPD- Stable, lungs clear to auscultation bilaterally, no concern for acute exacerbation at this time.  -continue Spiriva daily, Albuterol prn   #Prostate Cancer  - Patient following with Dr. Janice Norrie. Follow-up outpatient.   This is a Careers information officer Note.  The care of the patient was discussed with Dr. Mechele Claude and the assessment and plan formulated with their assistance.  Please see their attached note for official documentation of the daily encounter.   LOS: 1 day   Durene Fruits, Med Student 11/24/2013, 9:12 AM

## 2013-11-24 NOTE — Evaluation (Signed)
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 He noticed the weakness right around 2 o'clock and alerted his roommate's attention to get himself help. He was brought to the hospital and evaluated. He denies any new problems except the weakness.    Clinical Impression   Pt admitted with above.  Pt is at supervision-min guard level with ADLs and functional mobility, which is his baseline.  Pt would benefit from increased level of assist at home such as a [place] aide.  (See below recommendation).  No further acute OT needs. Will sign off.   Follow Up Recommendations  Supervision/Assistance - 24 hour ([place] aide)    Equipment Recommendations  None recommended by OT    Recommendations for Other Services       Precautions / Restrictions Precautions Precautions: Fall      Mobility Bed Mobility Overal bed mobility: Modified Independent Bed Mobility: Supine to Sit     Supine to sit: Modified independent (Device/Increase time)     General bed mobility comments: not assessed  Transfers Overall transfer level: Needs assistance Equipment used: Rolling walker (2 wheeled) Transfers: Sit to/from Stand Sit to Stand: Supervision         General transfer comment: uses bed to stabilize.  cued for hand placement and general transfer safety    Balance Overall balance assessment: Needs assistance Sitting-balance support: No upper extremity supported Sitting balance-Leahy Scale: Good Sitting balance - Comments: putting gown on EOB.  accepts challenge to balance   Standing balance support: Bilateral upper extremity supported Standing balance-Leahy Scale: Fair                              ADL Overall ADL's : Needs assistance/impaired     Grooming: Wash/dry  hands;Wash/dry face;Applying deodorant;Supervision/safety;Sitting;Standing   Upper Body Bathing: Supervision/ safety;Sitting;Set up   Lower Body Bathing: Supervison/ safety;Set up;Sit to/from stand   Upper Body Dressing : Supervision/safety;Set up;Sitting   Lower Body Dressing: Supervision/safety;Set up;Sit to/from stand   Toilet Transfer: Ambulation;Comfort height toilet;RW;Min guard   Toileting- Clothing Manipulation and Hygiene: Sit to/from stand;Supervision/safety       Functional mobility during ADLs: Min guard;Rolling walker General ADL Comments: Pt states he feels like he has returned to baseline. Pt completed UB/LB bathing and dressing tasks sitting on built in bench in bathroom with setup/supervision from OT.  Pt demo's intermittent left lateral lean but is able to self correct independently.  Use of grab bar on right side for sit<>stands.  Min guard with ambulation for safety.  Pt buckling slightly in left knee ambulating out of bathroom, but did note that he was not relying as heavily through UEs on RW.      Vision                     Perception     Praxis      Pertinent Vitals/Pain No c/o pain     Hand Dominance Right   Extremity/Trunk Assessment Upper Extremity Assessment Upper Extremity Assessment: Generalized weakness   Lower Extremity Assessment Lower Extremity Assessment: Generalized weakness;Overall WFL for tasks assessed (noticeably weaker L vs R side, but functional)   Cervical / Trunk Assessment Cervical / Trunk Assessment: Normal   Communication Communication Communication:  Expressive difficulties   Cognition Arousal/Alertness: Awake/alert Behavior During Therapy: WFL for tasks assessed/performed Overall Cognitive Status: No family/caregiver present to determine baseline cognitive functioning         Following Commands: Follows one step commands consistently           General Comments       Exercises       Shoulder  Instructions      Home Living Family/patient expects to be discharged to:: Private residence Living Arrangements: Non-relatives/Friends Available Help at Discharge: Friend(s);Available PRN/intermittently;Personal care attendant Type of Home: House Home Access: Stairs to enter [employer] of Steps: 1 Entrance Stairs-Rails: None Home Layout: One level     Bathroom Shower/Tub: Tub/shower unit;Curtain Shower/tub characteristics: Architectural technologist: Standard     Home Equipment: Environmental consultant - 2 wheels;Cane - single point;Shower seat;Wheelchair - manual   Additional Comments: Patient reports he uses sink on right to assist with sit><stand on toilet.       Prior Functioning/Environment Level of Independence: Needs assistance  Gait / Transfers Assistance Needed: uses AD for ambulation ADL's / Homemaking Assistance Needed: "Aid comes in 3 times/wk to assist with cooking, cleaning and helps with getting in shower"        OT Diagnosis:     OT Problem List:     OT Treatment/Interventions:      OT Goals(Current goals can be found in the care plan section) Acute Rehab OT Goals Patient Stated Goal: to get back home;  to get more CNA help  OT Frequency:     Barriers to D/C:            Co-evaluation              End of Session Equipment Utilized During Treatment: Rolling walker Nurse Communication: Mobility status  Activity Tolerance: Patient tolerated treatment well Patient left: in chair;with call bell/phone within reach;with chair alarm set   Time: 1030-1051 OT Time Calculation (min): 21 min Charges:  OT General Charges $OT Visit: 1 Procedure OT Evaluation $Initial OT Evaluation Tier I: 1 Procedure OT Treatments $Self Care/Home Management : 8-22 mins G-Codes: OT G-codes **NOT FOR INPATIENT CLASS** Functional Assessment Tool Used: clinical judgement Functional Limitation: Self care Self Care Current Status (Z3664): At least 1 percent but less than 20  percent impaired, limited or restricted Self Care Goal Status (Q0347): At least 1 percent but less than 20 percent impaired, limited or restricted Self Care Discharge Status [phone]): At least 1 percent but less than 20 percent impaired, limited or restricted  [name] 11/24/2013, 12:57 PM 11/24/2013 [name] OTR/L Pager [phone] Office [phone]

## 2013-11-24 NOTE — Discharge Instructions (Signed)
You DID NOT have another stroke. However, here is some information regarding strokes since you have a history of strokes in the past.   Please continue taking plavix.  Ischemic Stroke A stroke (cerebrovascular accident) is the sudden death of brain tissue. It is a medical emergency. A stroke can cause permanent loss of brain function. This can cause problems with different parts of your body. A transient ischemic attack (TIA) is different because it does not cause permanent damage. A TIA is a short-lived problem of poor blood flow affecting a part of the brain. A TIA is also a serious problem because having a TIA greatly increases the chances of having a stroke. When symptoms first develop, you cannot know if the problem might be a stroke or TIA. CAUSES  A stroke is caused by a decrease of oxygen supply to an area of your brain. It is usually the result of a small blood clot or collection of cholesterol or fat (plaque) that blocks blood flow in the brain. A stroke can also be caused by blocked or damaged carotid arteries.  RISK FACTORS  High blood pressure (hypertension).  High cholesterol.  Diabetes mellitus.  Heart disease.  The build up of plaque in the blood vessels (peripheral artery disease or atherosclerosis).  The build up of plaque in the blood vessels providing blood and oxygen to the brain (carotid artery stenosis).  An abnormal heart rhythm (atrial fibrillation).  Obesity.  Smoking.  Taking oral contraceptives (especially in combination with smoking).  Physical inactivity.  A diet high in fats, salt (sodium), and calories.  Alcohol use.  Use of illegal drugs (especially cocaine and methamphetamine).  Being African American.  Being over the age of 42.  Family history of stroke.  Previous history of blood clots, stroke, TIA, or heart attack.  Sickle cell disease. SYMPTOMS  These symptoms usually develop suddenly, or may be newly present upon awakening from  sleep:  Sudden weakness or numbness of the face, arm, or leg, especially on one side of the body.  Sudden trouble walking or difficulty moving arms or legs.  Sudden confusion.  Sudden personality changes.  Trouble speaking (aphasia) or understanding.  Difficulty swallowing.  Sudden trouble seeing in one or both eyes.  Double vision.  Dizziness.  Loss of balance or coordination.  Sudden severe headache with no known cause.  Trouble reading or writing. DIAGNOSIS  Your caregiver can often determine the presence or absence of a stroke based on your symptoms, history, and physical exam. Computed tomography (CT) of the brain is usually performed to confirm the stroke, determine causes, and determine stroke severity. Other tests may be done to find the cause of the stroke. These tests may include:  Electrocardiography.  Continuous heart monitoring.  Echocardiography.  Carotid ultrasonography.  Magnetic resonance imaging (MRI).  A scan of the brain circulation.  Blood tests. PREVENTION  The risk of a stroke can be decreased by appropriately treating high blood pressure, high cholesterol, diabetes, heart disease, and obesity and by quitting smoking, limiting alcohol, and staying physically active. TREATMENT  Time is of the essence. It is important to seek treatment within 3 4 hours of the start of symptoms because you may receive a medicine to dissolve the clot (thrombolytic) that cannot be given after that time. Even if you do not know when your symptoms began, get treatment as soon as possible. After the 4 hour window has passed, treatment may include rest, oxygen, intravenous (IV) fluids, and medicines to thin the  blood (anticoagulants). Treatment of stroke depends on the duration, severity, and cause of your symptoms. Medicines and diet may be used to address diabetes, high blood pressure, and other risk factors. Physical, speech, and occupational therapists will assess you  and work to improve any functions impaired by the stroke. Measures will be taken to prevent short-term and long-term complications, including infection from breathing foreign material into the lungs (aspiration pneumonia), blood clots in the legs, bedsores, and falls. Rarely, surgery may be needed to remove large blood clots or to open up blocked arteries. HOME CARE INSTRUCTIONS   Take all medicines prescribed by your caregiver. Follow the directions carefully. Medicines may be used to control risk factors for a stroke. Be sure you understand all your medicine instructions.  You may be told to take aspirin or the anticoagulant warfarin. Warfarin needs to be taken exactly as instructed.  Too much and too little warfarin are both dangerous. Too much warfarin increases the risk of bleeding. Too little warfarin continues to allow the risk for blood clots. While taking warfarin, you will need to have regular blood tests to measure your blood clotting time. These blood tests usually include both the PT and INR tests. The PT and INR results allow your caregiver to adjust your dose of warfarin. The dose can change for many reasons. It is critically important that you take warfarin exactly as prescribed, and that you have your PT and INR levels drawn exactly as directed.  Many foods, especially foods high in vitamin K can interfere with warfarin and affect the PT and INR results. Foods high in vitamin K include spinach, kale, broccoli, cabbage, collard and turnip greens, brussels sprouts, peas, cauliflower, seaweed, and parsley as well as beef and pork liver, green tea, and soybean oil. You should eat a consistent amount of foods high in vitamin K. Avoid major changes in your diet, or notify your caregiver before changing your diet. Arrange a visit with a dietitian to answer your questions.  Many medicines can interfere with warfarin and affect the PT and INR results. You must tell your caregiver about any and all  medicines you take, this includes all vitamins and supplements. Be especially cautious with aspirin and anti-inflammatory medicines. Do not take or discontinue any prescribed or over-the-counter medicine except on the advice of your caregiver or pharmacist.  Warfarin can have side effects, such as excessive bruising or bleeding. You will need to hold pressure over cuts for longer than usual. Your caregiver or pharmacist will discuss other potential side effects.  Avoid sports or activities that may cause injury or bleeding.  Be mindful when shaving, flossing your teeth, or handling sharp objects.  Alcohol can change the body's ability to handle warfarin. It is best to avoid alcoholic drinks or consume only very small amounts while taking warfarin. Notify your caregiver if you change your alcohol intake.  Notify your dentist or other caregivers before procedures.  If swallow studies have determined that your swallowing reflex is present, you should eat healthy foods. A diet that includes 5 or more servings of fruits and vegetables a day may reduce the risk of stroke. Foods may need to be a special consistency (soft or pureed), or small bites may need to be taken in order to avoid aspirating or choking. Certain diets may be prescribed to address high blood pressure, high cholesterol, diabetes, or obesity.  A low-sodium, low-saturated fat, low-trans fat, low-cholesterol diet is recommended to manage high blood pressure.  A low-saturated fat,  low-trans fat, low-cholesterol, and high-fiber diet may control cholesterol levels.  A controlled-carbohydrate, controlled-sugar diet is recommended to manage diabetes.  A reduced-calorie, low-sodium, low-saturated fat, low-trans fat, low-cholesterol diet is recommended to manage obesity.  Maintain a healthy weight.  Stay physically active. It is recommended that you get at least 30 minutes of activity on most or all days.  Do not smoke.  Limit alcohol  use even if you are not taking warfarin. Moderate alcohol use is considered to be:  No more than 2 drinks each day for men.  No more than 1 drink each day for nonpregnant women.  Stop drug abuse.  Home safety. A safe home environment is important to reduce the risk of falls. Your caregiver may arrange for specialists to evaluate your home. Having grab bars in the bedroom and bathroom is often important. Your caregiver may arrange for equipment to be used at home, such as raised toilets and a seat for the shower.  Physical, occupational, and speech therapy. Ongoing therapy may be needed to maximize your recovery after a stroke. If you have been advised to use a walker or a cane, use it at all times. Be sure to keep your therapy appointments.  Follow all instructions for follow-up with your caregiver. This is very important. This includes any referrals, physical therapy, rehabilitation, and lab tests. Proper follow up can prevent another stroke from occurring. SEEK MEDICAL CARE IF:  You have personality changes.  You have difficulty swallowing.  You are seeing double.  You have dizziness.  You have a fever.  You have skin breakdown. SEEK IMMEDIATE MEDICAL CARE IF:  Any of these symptoms may represent a serious problem that is an emergency. Do not wait to see if the symptoms will go away. Get medical help right away. Call your local emergency services (911 in [place] Do not drive yourself to the hospital.  You have sudden weakness or numbness of the face, arm, or leg, especially on one side of the body.  You have sudden trouble walking or difficulty moving arms or legs.  You have sudden confusion.  You have trouble speaking (aphasia) or understanding.  You have sudden trouble seeing in one or both eyes.  You have a loss of balance or coordination.  You have a sudden, severe headache with no known cause.  You have new chest pain or an irregular heartbeat.  You have a partial  or total loss of consciousness.   Document Released: 07/27/2005 Document Revised: 03/29/2013 Document Reviewed: 03/06/2012 [facility] Patient Information 2014 [employer].

## 2013-11-24 NOTE — Progress Notes (Signed)
Subjective: Patient feels as though his weakness is stable, not progressing since yesterday. No other complaints this morning. I witnessed patient working with PT. Walking well w/ walker.  Objective: Vital signs in last 24 hours: Filed Vitals:   11/24/13 0001 11/24/13 0200 11/24/13 0400 11/24/13 0625  BP: 164/81 152/69 156/76 138/76  Pulse: 71 71  79  Temp: 98.3 F (36.8 C) 98.4 F (36.9 C) 97.7 F (36.5 C) 98.6 F (37 C)  TempSrc: Oral Oral Oral Oral  Resp: 14 18 18 16   Height:      Weight:      SpO2: 100% 100% 99% 98%   Weight change:   Intake/Output Summary (Last 24 hours) at 11/24/13 0804 Last data filed at 11/24/13 0600  Gross per 24 hour  Intake      0 ml  Output    500 ml  Net   -500 ml   Physical Exam General: resting in bed, in no distress; witnessed pt sit up on side of bed himself  HEENT: PERRL, EOMI, no scleral icterus Cardiac: RRR Pulm: CTAB Abd: soft, nontender, nondistended, BS present  Ext: warm and well perfused, no pedal edema  Neuro: alert and oriented X3, cranial nerves II-XII grossly intact, dysarthric and with facial assymetry without overt abnormality of any CNs, strength is 4+/5 RUE/RLE and 5/5 LLE/LUE; sensation grossly intact to extremities and face  Lab Results: Basic Metabolic Panel:  Recent Labs Lab 11/23/13 1441 11/23/13 1448 11/24/13 0536  NA 136* 138 140  K 3.5* 3.3* 3.5*  CL 98 104 106  CO2 15*  --  22  GLUCOSE 117* 115* 82  BUN 16 14 13   CREATININE 1.11 1.20 0.97  CALCIUM 10.1  --  9.8   Liver Function Tests:  Recent Labs Lab 11/23/13 1441 11/24/13 0536  AST 34 27  ALT 30 24  ALKPHOS 71 63  BILITOT 0.2* 0.3  PROT 8.0 7.0  ALBUMIN 3.4* 3.1*   CBC:  Recent Labs Lab 11/23/13 1441 11/23/13 1448  WBC 6.8  --   NEUTROABS 2.6  --   HGB 9.1* 9.9*  HCT 27.9* 29.0*  MCV 85.1  --   PLT 232  --    Coagulation:  Recent Labs Lab 11/23/13 1441  LABPROT 13.7  INR 1.07   Urine Drug Screen: Drugs of Abuse     Component Value Date/Time   LABOPIA NONE DETECTED 11/24/2013 0307   LABOPIA NEGATIVE 10/14/2010 2140   COCAINSCRNUR NONE DETECTED 11/24/2013 0307   COCAINSCRNUR  Value: POSITIVE (NOTE) Result repeated and verified. Sent for confirmatory testing* 10/14/2010 2140   LABBENZ NONE DETECTED 11/24/2013 0307   LABBENZ NEGATIVE 10/14/2010 2140   AMPHETMU NONE DETECTED 11/24/2013 0307   AMPHETMU NEGATIVE 10/14/2010 2140   THCU NONE DETECTED 11/24/2013 0307   LABBARB NONE DETECTED 11/24/2013 0307    Alcohol Level:  Recent Labs Lab 11/23/13 1441  ETH <11   Urinalysis:  Recent Labs Lab 11/24/13 0307  COLORURINE YELLOW  LABSPEC 1.017  PHURINE 5.5  GLUCOSEU NEGATIVE  HGBUR NEGATIVE  BILIRUBINUR NEGATIVE  KETONESUR NEGATIVE  PROTEINUR NEGATIVE  UROBILINOGEN 0.2  NITRITE NEGATIVE  LEUKOCYTESUR NEGATIVE   Studies/Results: Ct Head Wo Contrast  11/23/2013   CLINICAL DATA:  Code stroke.  Left-sided weakness.  EXAM: CT HEAD WITHOUT CONTRAST  TECHNIQUE: Contiguous axial images were obtained from the base of the skull through the vertex without contrast.  COMPARISON:  CT head 10/31/2013.  MR head 11/01/2013.  FINDINGS: No evidence for acute  infarction, hemorrhage, mass lesion, hydrocephalus, or extra-axial fluid. Premature for age cerebral and cerebellar atrophy. Chronic right cerebellar infarct. Chronic left parietal infarct. Chronic medial right frontal infarct. No definite acute cerebral ischemia. Chronic microvascular ischemic change. Calvarium intact. Vascular calcification. No CT signs of proximal vascular thrombosis.  IMPRESSION: Chronic changes as described. There is little apparent progression of disease when compared with prior CT or MR.  Critical Value/emergent results were called by telephone at the time of interpretation on 11/23/2013 at 2:59 PM to Dr. Stroke neurologist, who verbally acknowledged these results.   Electronically Signed   By: Rolla Flatten M.D.   On: 11/23/2013 15:00   Mr [name] Wo  Contrast  11/23/2013   CLINICAL DATA:  Stroke  EXAM: MRA HEAD WITHOUT CONTRAST  TECHNIQUE: Angiographic images of the Circle of Willis were obtained using MRA technique without intravenous contrast.  COMPARISON:  Prior MRI performed earlier on the same day as well as previous MRA from 12/05/2012.  FINDINGS: Anterior circulation: Normal flow related enhancement of the included cervical, petrous, cavernous and supra clinoid internal carotid arteries. Multi focal atherosclerotic irregularity seen within the cavernous segments of the internal carotid arteries bilaterally, left greater than right. Stenoses within the cavernous segment of the left internal carotid measure up to 60-70% at its most narrow point (series 3, image 63). Overall, these findings are not significantly changed relative to prior MRA. Patent anterior communicating artery. Patent flow seen within the anterior cerebral and middle cerebral arteries bilaterally. Multi focal irregularity within the M1 and A2 segments bilaterally are grossly stable relative to prior study, likely related to underlying atheromatous disease.  Posterior circulation: Left vertebral artery is dominant and widely patent to the level of the vertebrobasilar junction. There is multi focal irregularity with occlusion of the distal right vertebral artery proximal to the vertebrobasilar junction, unchanged. Posterior inferior cerebellar arteries are within normal limits. Basilar artery is patent, with normal flow related enhancement of the main branch vessels. Normal flow related enhancement of the posterior cerebral arteries.  No large vessel occlusion, hemodynamically significant stenosis, abnormal luminal irregularity, aneurysm within the anterior nor posterior circulation.  IMPRESSION: 1. Multi focal irregularity within the cavernous segments of the internal carotid arteries bilaterally, left greater than right. There is associated stenosis of up to 60-70% within the cavernous  segment of the left internal carotid artery. Overall, these findings are not significantly changed relative to prior MRA from 12/05/2012. 2. Occlusion of the distal right vertebral artery, unchanged. The left vertebral artery is dominant and widely patent to the level of the vertebrobasilar junction. 3. Scattered multi focal atherosclerotic irregularity within the anterior and middle cerebral artery branches bilaterally, left greater than right. Findings are grossly stable relative to prior MRA from 12/05/2012. 4. No intracranial aneurysm   Electronically Signed   By: Jeannine Boga M.D.   On: 11/23/2013 22:46   [name]  11/23/2013   CLINICAL DATA:  Recent right anterior cerebral artery stroke. Worsening left-sided weakness.  EXAM: MRI HEAD WITHOUT CONTRAST  TECHNIQUE: Multiplanar, multiecho pulse sequences of the brain and surrounding structures were obtained without intravenous contrast.  COMPARISON:  Head CT same day.  MRI 11/01/2013.  FINDINGS: Diffusion imaging does not show any acute infarction. Changes related to subacute infarction in the right anterior cerebral artery territory are evident. No evidence of extension of the infarction.  Old cerebellar infarction on the right appears the same. Micro hemorrhages in the cerebellum appear the same and are chronic. Absent  flow in the right vertebral artery again noted. Old infarction affects the right lateral medulla.  The cerebral hemispheres elsewhere show chronic small vessel disease within the white matter. Few scattered micro hemorrhages are unchanged. These are old. Old infarction in the left basal ganglia/ external capsule region. Old small vessel thalamic infarctions. No mass lesion, acute hemorrhage, hydrocephalus or extra-axial collection. No pituitary mass. Chronic opacification of the left maxillary sinus again noted.  IMPRESSION: No evidence of new infarction or extension of the anterior cerebral artery infarction on the right.  Subacute infarction in that vascular territory.  Old right cerebellar and right lateral medulla infarction. Chronic small-vessel changes elsewhere throughout the brain.   Electronically Signed   By: Nelson Chimes M.D.   On: 11/23/2013 18:53   Medications: I have reviewed the patient's current medications. Scheduled Meds: . enoxaparin (LOVENOX) injection  40 mg Subcutaneous Q24H  . sodium chloride  3 mL Intravenous Q12H   Continuous Infusions: . 0.9 % NaCl with KCl 20 mEq / L 1,000 mL (11/23/13 1957)   PRN Meds:.  Assessment/Plan:  CVA (cerebral vascular accident) - Patient presents with generalized weakness with ? Leg shaking prior to admission. MRA/MRI head done yesterday was negative for acute stroke, there was subacute stroke to R ACA territory w/ old infarcts to R cerebellum and R lateral medulla. No concern for new or progression of the R ACA stroke he had in March 2015. Patient's neurological exam appears to be stable from his last discharge. Apparently, pt has complained of having intermittent leg jerking chronically. Last EEG 10/2013 did not show any seizure activity. I spoke with neurology and they agree with plan to discharge patient today. UDS negative. UA wnl. Patient had elevated lactate upon admission 3.37 w/ bicarb of 15 and AG 23, AG this morning 12 w/ bicarb 22.  -continue plavix, lipitor -restart antihypertensives as there is no evidence of acute stroke -PT/OT -cancel SLP as pt at baseline -soft diet  Hypokalemia- K 3.3 upon admission. Pt received IVF NS w/ KCl @100cc /hr overnight. Repeat K this morning is 3.5. Will give Kdur 40mEq x 1 dose. -d/c IVF -kdur 56mEq x 1 dose  Hypertension - Initally held antihypertensives for permissive HTN given possible stroke. Stroke ruled out, so will restart home lisinopril and HCTZ. -HCTZ 25mg  daily -Lisinopril 10mg  daily  COPD - Stable. Breathing well with no complaints. -restart home albuterol/spiriva  Smokes tobacco daily -  Patient advised to quit.   Dyslipidemia - Recent lipid panel shows LDL 40 and total cholesterol 95. Held Lipitor while NPO overnight. -restart lipitor  DVT ppx - lovenox   Dispo: Discharge likely today pending PT/OT eval  The patient does have a current PCP Corky Sox, MD) and does need an [facility] hospital follow-up appointment after discharge.  The patient does not have transportation limitations that hinder transportation to clinic appointments.  .Services Needed at time of discharge: Y = Yes, Blank = No PT:   OT:   RN:   Equipment:   Other:     LOS: 1 day   Rebecca Eaton, MD 11/24/2013, 8:04 AM

## 2013-11-24 NOTE — Evaluation (Signed)
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 He noticed the weakness right around 2 o'clock and alerted his roommate's attention to get himself help. He was brought to the hospital and evaluated. He denies any new problems except the weakness.   Clinical Impression  Patient evaluated by Physical Therapy with no further acute PT needs identified. All education has been completed and the patient has no further questions.  See below for any follow-up Physial Therapy or equipment needs. PT is signing off. Thank you for this referral.     Follow Up Recommendations Home health PT    Equipment Recommendations  None recommended by PT    Recommendations for Other Services       Precautions / Restrictions Precautions Precautions: Fall Restrictions Weight Bearing Restrictions: No      Mobility  Bed Mobility Overal bed mobility: Modified Independent Bed Mobility: Supine to Sit     Supine to sit: Modified independent (Device/Increase time)        Transfers Overall transfer level: Needs assistance   Transfers: Sit to/from Stand Sit to Stand: Supervision         General transfer comment: uses bed to stabilize.  cued for hand placement and general transfer safety  Ambulation/Gait Ambulation/Gait assistance: Supervision Ambulation Distance (Feet): 140 Feet (to the bathroom and back) Assistive device: Rolling walker (2 wheeled) Gait Pattern/deviations: Step-through pattern Gait velocity: decreased Gait velocity interpretation: Below normal speed for age/gender General Gait Details: moderate use of RW.  Generally steady gait with some mild instability as he gets fatigued or moves too fast in an ADL situation.  Stairs            Wheelchair Mobility     Modified Rankin (Stroke Patients Only) Modified Rankin (Stroke Patients Only) Pre-Morbid Rankin Score: Slight disability Modified Rankin: Slight disability     Balance Overall balance assessment: Needs assistance Sitting-balance support: No upper extremity supported Sitting balance-Leahy Scale: Good Sitting balance - Comments: putting gown on EOB.  accepts challenge to balance   Standing balance support: Bilateral upper extremity supported;During functional activity;No upper extremity supported Standing balance-Leahy Scale: Fair                               Pertinent Vitals/Pain     Home Living Family/patient expects to be discharged to:: Private residence Living Arrangements: Non-relatives/Friends Available Help at Discharge: Friend(s);Available PRN/intermittently;Personal care attendant Type of Home: House Home Access: Stairs to enter Entrance Stairs-Rails: None Entrance Stairs-Number of Steps: 1 Home Layout: One level Home Equipment: Walker - 2 wheels;Cane - single point;Shower seat;Wheelchair - manual Additional Comments: Patient reports he uses sink on right to assist with sit><stand on toilet.     Prior Function Level of Independence: Needs assistance   Gait / Transfers Assistance Needed: uses AD for ambulation  ADL's / Homemaking Assistance Needed: "Aid comes in 3 times/wk to assist with cooking, cleaning and helps with getting in shower"        Hand Dominance   Dominant Hand: Right    Extremity/Trunk Assessment   Upper Extremity Assessment: Generalized weakness           Lower Extremity Assessment: Generalized weakness;Overall [facility] for tasks assessed (noticeably weaker L vs R side, but  functional)      Cervical / Trunk Assessment: Normal  Communication   Communication: Expressive difficulties  Cognition Arousal/Alertness: Awake/alert Behavior During Therapy: WFL for tasks assessed/performed Overall Cognitive Status: No  family/caregiver present to determine baseline cognitive functioning         Following Commands: Follows one step commands consistently            General Comments      Exercises        Assessment/Plan    PT Assessment All further PT needs can be met in the next venue of care  PT Diagnosis Difficulty walking;Generalized weakness   PT Problem List Decreased strength;Decreased activity tolerance;Decreased balance  PT Treatment Interventions     PT Goals (Current goals can be found in the Care Plan section) Acute Rehab PT Goals Patient Stated Goal: to get back home;  to get more CNA help    Frequency     Barriers to discharge Decreased caregiver support      Co-evaluation               End of Session   Activity Tolerance: Patient tolerated treatment well Patient left: Other (comment) (with OT) Nurse Communication: Mobility status         Time: 1008-1040 PT Time Calculation (min): 32 min   Charges:   PT Evaluation $Initial PT Evaluation Tier I: 1 Procedure PT Treatments $Gait Training: 8-22 mins $Therapeutic Activity: 8-22 mins   PT G CodesTessie Fass Spencer Peterkin 11/24/2013, 11:09 AM

## 2013-11-24 NOTE — Progress Notes (Signed)
UR complete.  Orazio Weller RN, MSN 

## 2013-11-24 NOTE — H&P (Signed)
INTERNAL MEDICINE TEACHING ATTENDING NOTE  Day 1 of stay  Patient name: [name]  MRN: [id number] Date of birth: 11-Apr-1951   63 y.o.male with metastatic prostate cancer previous CVA and left sided weakness, topped by another CVA recently in March, coming in again with complaints of increased weakness in his LLE. He was brought to the ED and was found to have similar neurological exam as before. His MRI/MRA did not show new findings. His exam today with me reveals 4/5 strength in lower extremities, left slightly weaker than right. He is moving around with his walker like before - he does have a step gait which is probably from previous stroke. His speech appears slurred because he had all his teeth removed recently. He overall appears at his baseline as witnessed by me on previous admissions.  He can be discharged home with home health aide facilities. He will also get PT/OT at home. He is agreeable to this. He does need to contact his prostate cancer doctor to be started on Xgeva, which he understands that he will do.   I have seen and evaluated this patient and discussed it with my IM resident team.  Please see the rest of the plan per resident note from today.   Nahla Lukin 11/24/2013, 11:49 AM.

## 2013-11-24 NOTE — Progress Notes (Signed)
Discharge orders received, pt for discharge home today .  IV D/C,  D/C instructions given with verbalized understanding.  Family at bedside to assist pt with discharge. Staff brought pt downstairs via wheelchair.

## 2013-11-24 NOTE — H&P (Signed)
I have seen the patient and reviewed the daily progress note by Jimmy Chen MS3 and discussed the care of the patient with them.  See my note for documentation of my findings, assessment, and plans. 

## 2013-11-24 NOTE — Progress Notes (Signed)
I have seen the patient and reviewed the daily progress note by Durene Fruits MS3 and discussed the care of the patient with them.  See my note for documentation of my findings, assessment, and plans.

## 2013-11-24 NOTE — Progress Notes (Signed)
Stroke Team Progress Note  HISTORY [name] is an 63 y.o. male who recently had a CVA on November 01 2013 (<3 months since today). Patient lives at a nursing home and was last seen normal at 14:10. He was heard thumping his foot on the ground --when his friends came to see what was going on he was noted to have a left facial droop and left sided weakness. EMS was called and on arrival he was noted to be unable to place wight on his left leg and unable to hold his left arm antigravity. On arrival to ED he was showing increased strength of his left leg and arm. He continued to show a left facial droop.   Date last known well: Date: 11/23/2013  Time last known well: Time: 14:10  tPA Given: No: recent stroke     SUBJECTIVE The patient is very dysarthric. He voices no complaints.  OBJECTIVE Most recent Vital Signs: Filed Vitals:   11/24/13 0001 11/24/13 0200 11/24/13 0400 11/24/13 0625  BP: 164/81 152/69 156/76 138/76  Pulse: 71 71  79  Temp: 98.3 F (36.8 C) 98.4 F (36.9 C) 97.7 F (36.5 C) 98.6 F (37 C)  TempSrc: Oral Oral Oral Oral  Resp: 14 18 18 16   Height:      Weight:      SpO2: 100% 100% 99% 98%   CBG (last 3)  No results found for this basename: GLUCAP,  in the last 72 hours  IV Fluid Intake:   . 0.9 % NaCl with KCl 20 mEq / L 1,000 mL (11/23/13 1957)    MEDICATIONS  . enoxaparin (LOVENOX) injection  40 mg Subcutaneous Q24H  . sodium chloride  3 mL Intravenous Q12H   PRN:    Diet:  NPO no liquids Activity:  Up with assistance DVT Prophylaxis:  Lovenox  CLINICALLY SIGNIFICANT STUDIES Basic Metabolic Panel:  Recent Labs Lab 11/23/13 1441 11/23/13 1448 11/24/13 0536  NA 136* 138 140  K 3.5* 3.3* 3.5*  CL 98 104 106  CO2 15*  --  22  GLUCOSE 117* 115* 82  BUN 16 14 13   CREATININE 1.11 1.20 0.97  CALCIUM 10.1  --  9.8   Liver Function Tests:  Recent Labs Lab 11/23/13 1441 11/24/13 0536  AST 34 27  ALT 30 24  ALKPHOS 71 63  BILITOT 0.2* 0.3   PROT 8.0 7.0  ALBUMIN 3.4* 3.1*   CBC:  Recent Labs Lab 11/23/13 1441 11/23/13 1448  WBC 6.8  --   NEUTROABS 2.6  --   HGB 9.1* 9.9*  HCT 27.9* 29.0*  MCV 85.1  --   PLT 232  --    Coagulation:  Recent Labs Lab 11/23/13 1441  LABPROT 13.7  INR 1.07   Cardiac Enzymes: No results found for this basename: CKTOTAL, CKMB, CKMBINDEX, TROPONINI,  in the last 168 hours Urinalysis:  Recent Labs Lab 11/24/13 0307  COLORURINE YELLOW  LABSPEC 1.017  PHURINE 5.5  GLUCOSEU NEGATIVE  HGBUR NEGATIVE  BILIRUBINUR NEGATIVE  KETONESUR NEGATIVE  PROTEINUR NEGATIVE  UROBILINOGEN 0.2  NITRITE NEGATIVE  LEUKOCYTESUR NEGATIVE   Lipid Panel    Component Value Date/Time   CHOL 95 11/01/2013 0935   TRIG 128 11/01/2013 0935   HDL 29* 11/01/2013 0935   CHOLHDL 3.3 11/01/2013 0935   VLDL 26 11/01/2013 0935   LDLCALC 40 11/01/2013 0935   HgbA1C  Lab Results  Component Value Date   HGBA1C 5.6 11/01/2013    Urine Drug  Screen:     Component Value Date/Time   LABOPIA NONE DETECTED 11/24/2013 0307   LABOPIA NEGATIVE 10/14/2010 2140   COCAINSCRNUR NONE DETECTED 11/24/2013 0307   COCAINSCRNUR  Value: POSITIVE (NOTE) Result repeated and verified. Sent for confirmatory testing* 10/14/2010 2140   LABBENZ NONE DETECTED 11/24/2013 0307   LABBENZ NEGATIVE 10/14/2010 2140   AMPHETMU NONE DETECTED 11/24/2013 0307   AMPHETMU NEGATIVE 10/14/2010 2140   THCU NONE DETECTED 11/24/2013 0307   LABBARB NONE DETECTED 11/24/2013 0307    Alcohol Level:  Recent Labs Lab 11/23/13 1441  ETH <11    Ct Head Wo Contrast  11/23/2013    Chronic changes as described. There is little apparent progression of disease when compared with prior CT or MR.    Mr [name] Wo Contrast 11/23/2013    1. Multi focal irregularity within the cavernous segments of the internal carotid arteries bilaterally, left greater than right. There is associated stenosis of up to 60-70% within the cavernous segment of the left internal carotid  artery. Overall, these findings are not significantly changed relative to prior MRA from 12/05/2012. 2. Occlusion of the distal right vertebral artery, unchanged. The left vertebral artery is dominant and widely patent to the level of the vertebrobasilar junction. 3. Scattered multi focal atherosclerotic irregularity within the anterior and middle cerebral artery branches bilaterally, left greater than right. Findings are grossly stable relative to prior MRA from 12/05/2012. 4. No intracranial aneurysm      [name] 11/23/2013    No evidence of new infarction or extension of the anterior cerebral artery infarction on the right. Subacute infarction in that vascular territory.  Old right cerebellar and right lateral medulla infarction. Chronic small-vessel changes elsewhere throughout the brain.   Electronically Signed   By: Nelson Chimes M.D.   On: 11/23/2013 18:53   2D Echocardiogram  no need to repeat  Carotid Doppler  no need to repeat  CXR    EKG sinus rhythm rate 77 beats per minute For complete results please see formal report.   Therapy Recommendations home health therapies  Physical Exam   Mental Status:  Alert, oriented, thought content appropriate. Speech dysarthric without evidence of aphasia. Able to follow 3 step commands without difficulty.  Cranial Nerves:  II: Discs flat bilaterally; Visual fields grossly normal, pupils equal, round, reactive to light and accommodation  III,IV, VI: ptosis not present, extra-ocular motions intact bilaterally  V,VII: smile asymmetric on the left, facial light touch sensation decreased on the left  VIII: hearing normal bilaterally  IX,X: gag reflex present  XI: bilateral shoulder shrug  XII: midline tongue extension without atrophy or fasciculations  Motor:  Right : Upper extremity 5/5 Left: Upper extremity 4/5  Lower extremity 5/5 Lower extremity 4/5 -with drift  Tone and bulk:normal tone throughout; no atrophy noted  Sensory:  Pinprick and light touch decreased on the left  Deep Tendon Reflexes:  Right: Upper Extremity Left: Upper extremity  biceps (C-5 to C-6) 2/4 biceps (C-5 to C-6) 2/4  tricep (C7) 2/4 triceps (C7) 2/4  Brachioradialis (C6) 2/4 Brachioradialis (C6) 2/4  Lower Extremity Lower Extremity  quadriceps (L-2 to L-4) 1/4 quadriceps (L-2 to L-4) 1/4  Achilles (S1) 0/4 Achilles (S1) 0/4  Plantars:  Right: downgoing Left: mute  Cerebellar:  normal finger-to-nose, heel-to-shin test weak on the left but no ataxia  Gait: unable to test due to multiple leads.  CV: pulses palpable throughout   ASSESSMENT [name] is a  63 y.o. male presenting with subjective tremors and worsening left hemiparesis. TPA was not initiated secondary to a recent stroke. Imaging was negative for new stroke. On clopidogrel 75 mg orally every day prior to admission. Now on clopidogrel 75 mg orally every day for secondary stroke prevention. Patient with resultant residual left hemiparesis and dysarthria. Stroke work up completed.    Anemia  COPD  Recent right CVA  Cholesterol 95 LDL 40  Hypertension  History of substance abuse   Hospital day # 1  TREATMENT/PLAN  Continue clopidogrel 75 mg orally every day for secondary stroke prevention.  Okay to return to nursing facility - no new stroke.   Mikey Bussing PA-C [facility] Pager [phone] 11/24/2013, 7:54 AM  I have personally obtained a history, examined the patient, evaluated imaging results, and formulated the assessment and plan of care. I agree with the above. Antony Contras, MD   To contact Stroke Continuity provider, please refer to [email or web address] After hours, contact General Neurology

## 2013-11-24 NOTE — Discharge Summary (Signed)
Name: [name] MRN: [id number] DOB: 03/17/1951 63 y.o. PCP: Corky Sox, MD  Date of Admission: 11/23/2013  2:44 PM Date of Discharge: 11/24/2013 Attending Physician: Madilyn Fireman, MD  Discharge Diagnosis: Principal Problem:   Prior CVA (cerebral vascular accident), no new stroke this admission Active Problems:   Hypertension   COPD   Smokes tobacco daily   Dyslipidemia  Discharge Medications:   Medication List         albuterol 108 (90 BASE) MCG/ACT inhaler  Commonly known as:  PROVENTIL HFA;VENTOLIN HFA  Inhale 2 puffs into the lungs every 6 (six) hours as needed for wheezing.     atorvastatin 80 MG tablet  Commonly known as:  LIPITOR  Take 1 tablet (80 mg total) by mouth daily at 6 PM.     CALCIUM PO  Take 1 tablet by mouth daily.     chlorhexidine 0.12 % solution  Commonly known as:  PERIDEX  Rinse with 15 mls twice daily for 30 seconds. Use after breakfast and at bedtime. Spit out excess. Do not swallow.     clopidogrel 75 MG tablet  Commonly known as:  PLAVIX  Take 1 tablet (75 mg total) by mouth daily with breakfast.     hydrochlorothiazide 25 MG tablet  Commonly known as:  HYDRODIURIL  Take 1 tablet (25 mg total) by mouth daily.     lisinopril 10 MG tablet  Commonly known as:  PRINIVIL,ZESTRIL  Take 1 tablet (10 mg total) by mouth daily.     multivitamin with minerals Tabs tablet  Take 1 tablet by mouth daily.     tiotropium 18 MCG inhalation capsule  Commonly known as:  SPIRIVA HANDIHALER  Place 1 capsule (18 mcg total) into inhaler and inhale daily.       Disposition and follow-up:   [name] was discharged from [facility] in Stable condition.  At the hospital follow up visit please address:  1.  Did patient have Ignacio PT/RN contact him? How is he doing at home?   2.  Labs / imaging needed at time of follow-up: none  3.  Pending labs/ test needing follow-up: none  Follow-up Appointments: Follow-up  Information   Follow up with Luanne Bras, MD On 12/01/2013. (@10 :45am for hospital follow up)    Specialty:  Internal Medicine   Contact information:   [place] [address] [phone]       Discharge Instructions: Discharge Orders   Future Appointments Provider Department Dept Phone   12/01/2013 10:45 AM Cresenciano Genre, MD [place] [phone]   12/05/2013 9:00 AM [place] [phone]   01/10/2014 1:15 PM Corky Sox, MD [place] [phone]   Future Orders Complete By Expires   Call MD for:  difficulty breathing, headache or visual disturbances  As directed    Call MD for:  extreme fatigue  As directed    Call MD for:  persistant nausea and vomiting  As directed    Call MD for:  severe uncontrolled pain  As directed    Call MD for:  temperature >100.4  As directed    Diet - low sodium heart healthy  As directed    Increase activity slowly  As directed       Consultations:  neurology   Procedures Performed:  Dg Chest 2 View  11/01/2013   CLINICAL DATA:  Stroke.  Weakness.  Hypertension.  EXAM: CHEST  2 VIEW  COMPARISON:  10/24/2013  FINDINGS: Cardiac silhouette is normal in size and configuration. The aorta is mildly uncoiled. No mediastinal or hilar masses.  Lungs show vague nodular opacities most evident near the right apex. There is no lung consolidation to suggest pneumonia. No pulmonary edema. No pleural effusion or pneumothorax.  Bony thorax is demineralized.  IMPRESSION: 1. Vague nodular opacities are suggested in the lungs. Recommend followup chest CT. 2. No focal consolidation to suggest pneumonia. No evidence of pulmonary edema.   Electronically Signed   By: Lajean Manes M.D.   On: 11/01/2013 09:14   Dg Lumbar Spine Complete  10/25/2013   CLINICAL DATA:  Bilateral leg pain, history of prostate cancer  EXAM: LUMBAR SPINE - COMPLETE 4+ VIEW   COMPARISON:  None.  FINDINGS: Five views of lumbar spine submitted. No acute fracture or subluxation. Multilevel anterior spurring is noted. Mild disc space flattening at L4-L5 level. Moderate disc space flattening at L5-S1 level. Atherosclerotic calcifications of abdominal aorta and iliac arteries. Lateral spurring noted at L3-L4 level.  IMPRESSION: No acute fracture or subluxation. Multilevel anterior spurring. Disc space flattening at L4-L5 and L5-S1 level.   Electronically Signed   By: Lahoma Crocker M.D.   On: 10/25/2013 14:56   Ct Head Wo Contrast  11/23/2013   CLINICAL DATA:  Code stroke.  Left-sided weakness.  EXAM: CT HEAD WITHOUT CONTRAST  TECHNIQUE: Contiguous axial images were obtained from the base of the skull through the vertex without contrast.  COMPARISON:  CT head 10/31/2013.  MR head 11/01/2013.  FINDINGS: No evidence for acute infarction, hemorrhage, mass lesion, hydrocephalus, or extra-axial fluid. Premature for age cerebral and cerebellar atrophy. Chronic right cerebellar infarct. Chronic left parietal infarct. Chronic medial right frontal infarct. No definite acute cerebral ischemia. Chronic microvascular ischemic change. Calvarium intact. Vascular calcification. No CT signs of proximal vascular thrombosis.  IMPRESSION: Chronic changes as described. There is little apparent progression of disease when compared with prior CT or MR.  Critical Value/emergent results were called by telephone at the time of interpretation on 11/23/2013 at 2:59 PM to Dr. Stroke neurologist, who verbally acknowledged these results.   Electronically Signed   By: Rolla Flatten M.D.   On: 11/23/2013 15:00   Ct Head Wo Contrast  10/31/2013   CLINICAL DATA:  Left-sided weakness  EXAM: CT HEAD WITHOUT CONTRAST  TECHNIQUE: Contiguous axial images were obtained from the base of the skull through the vertex without intravenous contrast.  COMPARISON:  Prior CT from 10/24/2013  FINDINGS: Encephalomalacia within the right  cerebellar hemisphere again seen, Stable relative to prior exam. Mild atrophy with moderate chronic microvascular ischemic changes are also stable. Prominent atherosclerotic calcifications noted within the carotid siphons and distal vertebral arteries.  There is no acute intracranial hemorrhage or large vessel territory infarct. No mass lesion or midline shift. Gray-white matter differentiation is well maintained. Ventricles are normal in size without evidence of hydrocephalus. No extra-axial fluid collection.  The calvarium is intact.  Orbital soft tissues are within normal limits.  There is complete opacification of the left maxillary sinus with mild polypoid opacity within the right maxillary sinus. Minimal opacity seen within the inferior right frontal sinus. No mastoid effusion.  Scalp soft tissues are unremarkable.  IMPRESSION: 1. No acute intracranial process identified. 2. Mild atrophy with moderate chronic microvascular ischemic disease and remote right cerebellar infarct, unchanged. 3. Paranasal sinus disease as above, similar relative to recent CT from 10/24/2013.  Electronically Signed   By: Jeannine Boga M.D.   On: 10/31/2013 18:50   Mr [name] Wo Contrast  11/23/2013   CLINICAL DATA:  Stroke  EXAM: MRA HEAD WITHOUT CONTRAST  TECHNIQUE: Angiographic images of the Circle of Willis were obtained using MRA technique without intravenous contrast.  COMPARISON:  Prior MRI performed earlier on the same day as well as previous MRA from 12/05/2012.  FINDINGS: Anterior circulation: Normal flow related enhancement of the included cervical, petrous, cavernous and supra clinoid internal carotid arteries. Multi focal atherosclerotic irregularity seen within the cavernous segments of the internal carotid arteries bilaterally, left greater than right. Stenoses within the cavernous segment of the left internal carotid measure up to 60-70% at its most narrow point (series 3, image 63). Overall, these findings  are not significantly changed relative to prior MRA. Patent anterior communicating artery. Patent flow seen within the anterior cerebral and middle cerebral arteries bilaterally. Multi focal irregularity within the M1 and A2 segments bilaterally are grossly stable relative to prior study, likely related to underlying atheromatous disease.  Posterior circulation: Left vertebral artery is dominant and widely patent to the level of the vertebrobasilar junction. There is multi focal irregularity with occlusion of the distal right vertebral artery proximal to the vertebrobasilar junction, unchanged. Posterior inferior cerebellar arteries are within normal limits. Basilar artery is patent, with normal flow related enhancement of the main branch vessels. Normal flow related enhancement of the posterior cerebral arteries.  No large vessel occlusion, hemodynamically significant stenosis, abnormal luminal irregularity, aneurysm within the anterior nor posterior circulation.  IMPRESSION: 1. Multi focal irregularity within the cavernous segments of the internal carotid arteries bilaterally, left greater than right. 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 Overall, these findings are not significantly changed relative to prior MRA from 12/05/2012. 2. Occlusion of the distal right vertebral artery, unchanged. The left vertebral artery is dominant and widely patent to the level of the vertebrobasilar junction. 3. Scattered multi focal atherosclerotic irregularity within the anterior and middle cerebral artery branches bilaterally, left greater than right. Findings are grossly stable relative to prior MRA from 12/05/2012. 4. No intracranial aneurysm   Electronically Signed   By: Jeannine Boga M.D.   On: 11/23/2013 22:46   [name]  11/23/2013   CLINICAL DATA:  Recent right anterior cerebral artery stroke. Worsening left-sided weakness.   EXAM: MRI HEAD WITHOUT CONTRAST  TECHNIQUE: Multiplanar, multiecho pulse sequences of the brain and surrounding structures were obtained without intravenous contrast.  COMPARISON:  Head CT same day.  MRI 11/01/2013.  FINDINGS: Diffusion imaging does not show any acute infarction. Changes related to subacute infarction in the right anterior cerebral artery territory are evident. No evidence of extension of the infarction.  Old cerebellar infarction on the right appears the same. Micro hemorrhages in the cerebellum appear the same and are chronic. Absent flow in the right vertebral artery again noted. Old infarction affects the right lateral medulla.  The cerebral hemispheres elsewhere show chronic small vessel disease within the white matter. Few scattered micro hemorrhages are unchanged. These are old. Old infarction in the left basal ganglia/ external capsule region. Old small vessel thalamic infarctions. No mass lesion, acute hemorrhage, hydrocephalus or extra-axial collection. No pituitary mass. Chronic opacification of the left maxillary sinus again noted.  IMPRESSION: No evidence of new infarction or extension of the anterior cerebral artery infarction on the right. Subacute infarction in that vascular  territory.  Old right cerebellar and right lateral medulla infarction. Chronic small-vessel changes elsewhere throughout the brain.   Electronically Signed   By: Nelson Chimes M.D.   On: 11/23/2013 18:53   [name]  11/01/2013   CLINICAL DATA:  Stroke. Prostate cancer. Right leg twitching. Inability to speak.  EXAM: MRI HEAD WITHOUT CONTRAST  TECHNIQUE: Multiplanar, multiecho pulse sequences of the brain and surrounding structures were obtained without intravenous contrast.  COMPARISON:  CT HEAD W/O CM dated 10/31/2013; MR HEAD W/O CM dated 12/05/2012; CT HEAD W/O CM dated 10/24/2013  FINDINGS: The patient was unable to remain motionless for the exam. Small or subtle lesions could be overlooked.   Patchy and variably hyperintense areas of restricted diffusion are seen within the medial right frontal lobe and medial right parietal lobe consistent with an acute to subacute right ACA territory infarct. On the previous MR from 2014, a more proximal right ACA infarct affected the genu of the corpus callosum. Pattern of ischemia today is more distal and more superior. No hemorrhage is present.  Chronic right cerebellar infarct. Generalized atrophy. Extensive small vessel disease throughout the periventricular and subcortical white matter. Flow voids are maintained in the carotid, basilar, and vertebral arteries. Proximally, both anterior cerebral arteries appear patent although patency of the distal right ACA in the interhemispheric fissure cannot clearly be established.  Tiny focus chronic hemorrhage left cerebellum and left external capsule where there is a remote hemorrhage/lacunar infarct.  No calvarial or skull base lesions. No visible dural tumor deposits on this noncontrast exam. Unremarkable pituitary and cerebellar tonsils. Complete or near-complete filling of the left maxillary sinus with fluid or inflammatory tissue.  IMPRESSION: Acute to subacute right ACA territory infarct affecting the medial frontal lobe greater than the medial parietal lobe. No hemorrhage or mass effect.  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 Generalized atrophy with extensive small vessel disease.  No visible intracranial, calvarial or skull base lesions in this patient with prostate cancer.   Electronically Signed   By: Rolla Flatten M.D.   On: 11/01/2013 09:11   Mr Lumbar Spine W Wo Contrast  10/26/2013   CLINICAL DATA:  63 year old male with severe low back pain and difficulty walking. Initial encounter. History of metastatic prostate cancer.  EXAM: MRI LUMBAR SPINE WITHOUT AND WITH  CONTRAST  TECHNIQUE: Multiplanar and multiecho pulse sequences of the lumbar spine were obtained without and with intravenous contrast.  CONTRAST:  40mL MULTIHANCE GADOBENATE DIMEGLUMINE 529 MG/ML IV SOLN  COMPARISON:  Lumbar radiographs 10/25/2013. Nuclear medicine whole-body bone scan 12/1912.  FINDINGS: T1 bone marrow signal is heterogeneous throughout the visible spine and pelvis. The appearance is highly suspicious for numerous small osseous metastases (e.g. Series 6, image 15 in the L2 vertebral body), however, no enhancing or overtly destructive osseous lesion is identified.  Preserved lumbar vertebral height and alignment.  Visualized lower thoracic spinal cord is normal with conus medularis at L1-L2. No abnormal intradural enhancement.  Negative visualized abdominal viscera (simple appearing right renal midpole cyst).  The primary degenerative finding in the lumbar spine is disc bulging and epidural lipomatosis. Details:  T10-T11:  Mild disc desiccation.  T11-T12:  Negative.  T12-L1:  Negative.  L1-L2:  Mild disc bulge.  L2-L3: Left eccentric circumferential disc osteophyte complex. Mild facet hypertrophy. Mild to moderate epidural lipomatosis. Overall mild spinal stenosis (series 8, image 15). Mild to  moderate left L2 foraminal stenosis.  L3-L4: Left eccentric circumferential disc bulge. Mild facet hypertrophy. No significant spinal stenosis. Mild to moderate bilateral L3 foraminal stenosis, greater on the right.  L4-L5: Circumferential disc osteophyte complex. Mild to moderate facet hypertrophy. Mild epidural lipomatosis. No spinal stenosis. Mild to moderate bilateral L4 foraminal stenosis, greater on the right.  L5-S1: Circumferential disc osteophyte complex. Mild facet hypertrophy. Epidural lipomatosis. No spinal stenosis. Mild left and moderate right L5 foraminal stenosis.  IMPRESSION: 1. Heterogeneous bone marrow signal throughout the visible spine and pelvis is highly suspicious for numerous small  bone metastases in this setting, even though no destructive or enhancing lesion is identified. A repeat nuclear medicine whole-body bone scan to compare to that on 12/26/2012 would be valuable. 2. Multilevel lumbar spine degenerative changes. Multifactorial mild spinal stenosis at L2-L3. Multilevel multifactorial moderate lumbar neural foraminal stenosis.   Electronically Signed   By: Augusto Gamble M.D.   On: 10/26/2013 11:33   Admission HPI:  The patient is a [name] with recent R ACA stroke (March 2015) coming in with worsening L sided weakness. He noticed the weakness right around 2 o'clock and alerted his roommate's attention to get himself help. He was brought to the hospital and evaluated. He denies any new problems except the weakness. He denies change in his speech or hearing or vision. He denies blurred or double vision. He denies SOB, cough, chest pain, nausea, vomiting, diarrhea, abdominal pain, dysuria, constipation. He is also denying recent fall and states that he gets around with walker and wheelchair at home with some help from his roommate. He denies fevers or chills and is not having any pain. He also has PMH of HTN, COPD, elevated PSA, history of drug use (but denies recent usage of cocaine).   Hospital Course by problem list: Prior CVA (cerebral vascular accident), no new CVA- Patient presents with generalized weakness (questionable if L>R) with left leg "shaking" prior to admission. Patient with no new facial asymmetry, vision changes or speech changes. His neurological exam was stable from his discharge in 10/2013 when he had R ACA territory stroke. Neurology evaluated in the ED but he was not a candidate for tPA due to recent stroke within 3 months. Pt admitted to telemetry and home antihypertensives held for permissive HTN. Head CT negative in ED. Labs revealed elevated lactate w/ AG 23 that resolved on HD 1, AG 12 and bicarb 22. Blood work otherwise unrevealing. UDS and UA negative. Per  neurology's recommendation, did not repeat stroke workup as he has had it within one month, though did obtain new head MRA/MRI. Stroke work up from march 2015 is as follows: MRI showed acute to subacute right ACA infarct affecting medial frontal lobe (no hemorrhage or mass effect); Bilateral carotid dopplers negative. Transcranial dopplers negative. Echo from 11/2012 showed EF 65% w/ grade 1 diastolic dysfunction; risk stratification: A1C 5.6%, LDL 40. EEG negative for seizure. He was taking plavix, lipitor, BP meds at home- pt reports compliance. MRA/MRI head done this admission was negative for acute stroke, though there was subacute stroke to R ACA territory w/ old infarcts to R cerebellum and R lateral medulla. No concern for new or progression of the R ACA stroke he had in March 2015.  Apparently, pt has complained of having intermittent leg jerking chronically. Last EEG 10/2013 did not show any seizure activity. I spoke with neurology on morning of discharge and they agreed with plan to discharge patient. Patient had been kept  NPO for SLP so home oral meds held initially. However, patient's neuro exam remained at baseline so SLP eval canceled and pt given soft diet (s/p edentulous). His home plavix, lipitor, and antihypertensives restarted upon discharge. PT evaluated patient and recommended HH PT/aide. However, patient's medicare does not cover an aide, so [facility] RN was ordered instead.  Hypokalemia - [name] was 3.3 upon admission. He received IVF NS w/ KCl @100cc /hr overnight. Repeat K this morning was 3.5. Kdur 45mEq x 1 given morning before discharge and IVF were discontinued.   Hypertension - Pt on HCTZ 25mg  daily and lisinopril 10mg  daily at home. Antihypertensives held at admission given possible stroke. Stroke ruled out, so home medications restarted. Pt normotensive this admission.  COPD - Stable, no active issues during his hospitalization. Home albuterol/spiriva restarted at  discharge.  Smokes tobacco daily - Patient advised to quit.   Dyslipidemia - Lipid panel 10/2013 showed LDL 40 and total cholesterol 95. Held home lipitor given kept pt NPO overnight. Restarted home statin at discharge.  Discharge Vitals:   BP 138/76  Pulse 79  Temp(Src) 98.6 F (37 C) (Oral)  Resp 16  Ht 5' 8.5" (1.74 m)  Wt 153 lb 6.4 oz (69.582 kg)  BMI 22.98 kg/m2  SpO2 98%  Discharge Labs:  Results for orders placed during the hospital encounter of 11/23/13 (from the past 24 hour(s))  ETHANOL     Status: None   Collection Time    11/23/13  2:41 PM      Result Value Ref Range   Alcohol, Ethyl (B) <11  0 - 11 mg/dL  PROTIME-INR     Status: None   Collection Time    11/23/13  2:41 PM      Result Value Ref Range   Prothrombin Time 13.7  11.6 - 15.2 seconds   INR 1.07  0.00 - 1.49  APTT     Status: None   Collection Time    11/23/13  2:41 PM      Result Value Ref Range   aPTT 26  24 - 37 seconds  CBC     Status: Abnormal   Collection Time    11/23/13  2:41 PM      Result Value Ref Range   WBC 6.8  4.0 - 10.5 K/uL   RBC 3.28 (*) 4.22 - 5.81 MIL/uL   Hemoglobin 9.1 (*) 13.0 - 17.0 g/dL   HCT 27.9 (*) 39.0 - 52.0 %   MCV 85.1  78.0 - 100.0 fL   MCH 27.7  26.0 - 34.0 pg   MCHC 32.6  30.0 - 36.0 g/dL   RDW 14.3  11.5 - 15.5 %   Platelets 232  150 - 400 K/uL  DIFFERENTIAL     Status: Abnormal   Collection Time    11/23/13  2:41 PM      Result Value Ref Range   Neutrophils Relative % 38 (*) 43 - 77 %   Neutro Abs 2.6  1.7 - 7.7 K/uL   Lymphocytes Relative 52 (*) 12 - 46 %   Lymphs Abs 3.5  0.7 - 4.0 K/uL   Monocytes Relative 9  3 - 12 %   Monocytes Absolute 0.6  0.1 - 1.0 K/uL   Eosinophils Relative 1  0 - 5 %   Eosinophils Absolute 0.1  0.0 - 0.7 K/uL   Basophils Relative 0  0 - 1 %   Basophils Absolute 0.0  0.0 - 0.1 K/uL  COMPREHENSIVE  METABOLIC PANEL     Status: Abnormal   Collection Time    11/23/13  2:41 PM      Result Value Ref Range   Sodium 136 (*)  137 - 147 mEq/L   Potassium 3.5 (*) 3.7 - 5.3 mEq/L   Chloride 98  96 - 112 mEq/L   CO2 15 (*) 19 - 32 mEq/L   Glucose, Bld 117 (*) 70 - 99 mg/dL   BUN 16  6 - 23 mg/dL   Creatinine, Ser 1.11  0.50 - 1.35 mg/dL   Calcium 10.1  8.4 - 10.5 mg/dL   Total Protein 8.0  6.0 - 8.3 g/dL   Albumin 3.4 (*) 3.5 - 5.2 g/dL   AST 34  0 - 37 U/L   ALT 30  0 - 53 U/L   Alkaline Phosphatase 71  39 - 117 U/L   Total Bilirubin 0.2 (*) 0.3 - 1.2 mg/dL   GFR calc non Af Amer 68 (*) >90 mL/min   GFR calc Af Amer 79 (*) >90 mL/min  I-STAT CHEM 8, ED     Status: Abnormal   Collection Time    11/23/13  2:48 PM      Result Value Ref Range   Sodium 138  137 - 147 mEq/L   Potassium 3.3 (*) 3.7 - 5.3 mEq/L   Chloride 104  96 - 112 mEq/L   BUN 14  6 - 23 mg/dL   Creatinine, Ser 1.20  0.50 - 1.35 mg/dL   Glucose, Bld 115 (*) 70 - 99 mg/dL   Calcium, Ion 1.24  1.13 - 1.30 mmol/L   TCO2 17  0 - 100 mmol/L   Hemoglobin 9.9 (*) 13.0 - 17.0 g/dL   HCT 29.0 (*) 39.0 - 52.0 %  I-STAT TROPOININ, ED     Status: None   Collection Time    11/23/13  2:51 PM      Result Value Ref Range   Troponin i, poc 0.00  0.00 - 0.08 ng/mL   Comment 3           I-STAT CG4 LACTIC ACID, ED     Status: Abnormal   Collection Time    11/23/13  3:51 PM      Result Value Ref Range   Lactic Acid, Venous 3.37 (*) 0.5 - 2.2 mmol/L  URINE RAPID DRUG SCREEN (HOSP PERFORMED)     Status: None   Collection Time    11/24/13  3:07 AM      Result Value Ref Range   Opiates NONE DETECTED  NONE DETECTED   Cocaine NONE DETECTED  NONE DETECTED   Benzodiazepines NONE DETECTED  NONE DETECTED   Amphetamines NONE DETECTED  NONE DETECTED   Tetrahydrocannabinol NONE DETECTED  NONE DETECTED   Barbiturates NONE DETECTED  NONE DETECTED  URINALYSIS, ROUTINE W REFLEX MICROSCOPIC     Status: None   Collection Time    11/24/13  3:07 AM      Result Value Ref Range   Color, Urine YELLOW  YELLOW   APPearance CLEAR  CLEAR   Specific Gravity, Urine 1.017   1.005 - 1.030   pH 5.5  5.0 - 8.0   Glucose, UA NEGATIVE  NEGATIVE mg/dL   Hgb urine dipstick NEGATIVE  NEGATIVE   Bilirubin Urine NEGATIVE  NEGATIVE   Ketones, ur NEGATIVE  NEGATIVE mg/dL   Protein, ur NEGATIVE  NEGATIVE mg/dL   Urobilinogen, UA 0.2  0.0 - 1.0 mg/dL   Nitrite NEGATIVE  NEGATIVE   Leukocytes, UA NEGATIVE  NEGATIVE  COMPREHENSIVE METABOLIC PANEL     Status: Abnormal   Collection Time    11/24/13  5:36 AM      Result Value Ref Range   Sodium 140  137 - 147 mEq/L   Potassium 3.5 (*) 3.7 - 5.3 mEq/L   Chloride 106  96 - 112 mEq/L   CO2 22  19 - 32 mEq/L   Glucose, Bld 82  70 - 99 mg/dL   BUN 13  6 - 23 mg/dL   Creatinine, Ser 0.97  0.50 - 1.35 mg/dL   Calcium 9.8  8.4 - 10.5 mg/dL   Total Protein 7.0  6.0 - 8.3 g/dL   Albumin 3.1 (*) 3.5 - 5.2 g/dL   AST 27  0 - 37 U/L   ALT 24  0 - 53 U/L   Alkaline Phosphatase 63  39 - 117 U/L   Total Bilirubin 0.3  0.3 - 1.2 mg/dL   GFR calc non Af Amer 85 (*) >90 mL/min   GFR calc Af Amer >90  >90 mL/min   Signed: Rebecca Eaton, MD 11/24/2013, 12:51 PM   Time Spent on Discharge: 35 minutes Services Ordered on Discharge: [facility] PT Equipment Ordered on Discharge: none

## 2013-11-24 NOTE — Progress Notes (Signed)
SLP Cancellation Note  Patient Details Name: [name] MRN: [id number] DOB: 30-May-1951   Cancelled treatment:        Orders received for ST evaluations, however, orders were subsequently cancelled.  Please consult if needs arise. Thank you!  Raetta Agostinelli B. Quentin Ore [facility], [place] [phone]  Lorre Nick 11/24/2013, 8:36 AM

## 2013-11-24 NOTE — Care Management Note (Unsigned)
    Page 1 of 1   11/24/2013     11:31:14 AM CARE MANAGEMENT NOTE 11/24/2013  Patient:  [name], [name]   Account Number:  [id number]  Date Initiated:  11/24/2013  Documentation initiated by:  Lorne Skeens  Subjective/Objective Assessment:   Patient admitted with L sided weakness, concern for acute versus evolving CVA. 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 Patient lives at home alone and is an active patient with [place] for PT/RN     Action/Plan:   Will follow for discharge needs.   Anticipated DC Date:  11/24/2013   Anticipated DC Plan:  [place]  CM consult      Choice offered to / List presented to:  C-1 Patient        [place] arranged  HH-1 RN  [place] agency  [facility]   Status of service:  Completed, signed off Medicare Important Message given?   (If response is "NO", the following Medicare IM given date fields will be blank) Date Medicare IM given:   Date Additional Medicare IM given:    Discharge Disposition:    Per UR Regulation:    If discussed at Long Length of Stay Meetings, dates discussed:    Comments:  11/24/13 [place], MSN, CM- Orders for [facility] PT/aide noted.  Patient is active with [facility] for HHPT/RN, but will be unable to recieve a HHA through his insurance. Dr Mechele Claude notified, and will add order to resume RN, per [employer]'s request.  Stanton Kidney with [facility] was notified of plans for discharge today. Patient will be seen over the weekend.

## 2013-11-27 ENCOUNTER — Encounter: Payer: Self-pay | Admitting: Licensed Clinical Social Worker

## 2013-11-27 ENCOUNTER — Other Ambulatory Visit: Payer: Self-pay | Admitting: Internal Medicine

## 2013-11-27 DIAGNOSIS — I639 Cerebral infarction, unspecified: Secondary | ICD-10-CM

## 2013-11-27 NOTE — Discharge Summary (Signed)
Reviewed DC summary. Agree with plan.

## 2013-11-27 NOTE — Progress Notes (Signed)
Patient ID: LLIAM HOH, male   DOB: [birth year], 63 y.o.   MRN: [id number] [place] Management note for M. [place] RN 11/23/2013: I performed a home visit with this pt. today. He has a desire to stop smoking & is interested in receiving a prescription for nicotine patches. Also, he needs an elevated toilet seat (preferably a 3 in 1), as he has Lt. sided weakness s/p CVA's. He is being linked with Medicaid transportation & a new pharmacy that will deliver his medications to him. He says, he's awaiting a call from PCP office re: outpatient PT. Says, he was assessed per nurse last Monday for personal care services, but hasn't received a determination, as yet.

## 2013-11-28 NOTE — Progress Notes (Signed)
Late Entry Addendum to pt's Initial Evaluation   11/24/13 1038  PT Time Calculation  PT Start Time 1008  PT Stop Time 1040  PT Time Calculation (min) 32 min  PT G-Codes **NOT FOR INPATIENT CLASS**  Functional Assessment Tool Used clinical judgement  Functional Limitation Mobility: Walking and moving around  Mobility: Walking and Moving Around Current Status (B9038) CI  Mobility: Walking and Moving Around Goal Status (B3383) CI  Mobility: Walking and Moving Around Discharge Status (A9191) CI  PT General Charges  $$ ACUTE PT VISIT 1 Procedure  PT Evaluation  $Initial PT Evaluation Tier I 1 Procedure  PT Treatments  $Gait Training 8-22 mins  $Therapeutic Activity 8-22 mins   11/28/2013  Donnella Sham, PT [phone]  (pager)

## 2013-12-01 ENCOUNTER — Encounter: Payer: Self-pay | Admitting: Internal Medicine

## 2013-12-01 ENCOUNTER — Telehealth: Payer: Self-pay | Admitting: *Deleted

## 2013-12-01 ENCOUNTER — Ambulatory Visit (INDEPENDENT_AMBULATORY_CARE_PROVIDER_SITE_OTHER): Payer: Medicaid Other | Admitting: Internal Medicine

## 2013-12-01 VITALS — BP 105/67 | HR 84 | Temp 96.5°F | Ht 68.5 in | Wt 153.2 lb

## 2013-12-01 DIAGNOSIS — I10 Essential (primary) hypertension: Secondary | ICD-10-CM

## 2013-12-01 DIAGNOSIS — Z8673 Personal history of transient ischemic attack (TIA), and cerebral infarction without residual deficits: Secondary | ICD-10-CM

## 2013-12-01 DIAGNOSIS — I639 Cerebral infarction, unspecified: Secondary | ICD-10-CM

## 2013-12-01 DIAGNOSIS — E876 Hypokalemia: Secondary | ICD-10-CM

## 2013-12-01 LAB — BASIC METABOLIC PANEL WITH GFR
BUN: 21 mg/dL (ref 6–23)
CO2: 26 meq/L (ref 19–32)
Calcium: 10.2 mg/dL (ref 8.4–10.5)
Chloride: 103 mEq/L (ref 96–112)
Creat: 1.42 mg/dL — ABNORMAL HIGH (ref 0.50–1.35)
GFR, EST AFRICAN AMERICAN: 60 mL/min
GFR, Est Non African American: 52 mL/min — ABNORMAL LOW
GLUCOSE: 102 mg/dL — AB (ref 70–99)
POTASSIUM: 3.6 meq/L (ref 3.5–5.3)
Sodium: 140 mEq/L (ref 135–145)

## 2013-12-01 MED ORDER — CLOPIDOGREL BISULFATE 75 MG PO TABS: 75.0000 mg | ORAL_TABLET | Freq: Every day | ORAL | Status: DC

## 2013-12-01 MED ORDER — LISINOPRIL 10 MG PO TABS: 10.0000 mg | ORAL_TABLET | Freq: Every day | ORAL | Status: DC

## 2013-12-01 NOTE — Telephone Encounter (Signed)
Dr. Aundra Dubin called, pt in her office.   Has episode yesterday of L sided weakness,  Today in her office better but not back to baseline.  Had last scan 11-23-13.  Asking for appt today or early next week.   Call her pgr [phone].   Feels like pt needs to be seen.  Seen in ED 10-31-13 and again 11-23-13.

## 2013-12-01 NOTE — Patient Instructions (Signed)
General Instructions: We will try to get you more outpatient PT We are trying to get you an appointment with neurology next week.  Come to the ED if your symptoms worsen for stroke (read below) Follow up in 2-3 months  Treatment Goals:  Goals (1 Years of Data) as of 12/01/13         As of Today 11/24/13 11/24/13 11/24/13 11/24/13     Blood Pressure    . Blood Pressure < 140/90  105/67 138/76 156/76 152/69 164/81     Lifestyle    . Quit smoking / using tobacco           Result Component    . LDL CALC < 100            Progress Toward Treatment Goals:  Treatment Goal 12/01/2013  Blood pressure at goal  Stop smoking smoking the same amount  Prevent falls at goal    Self Care Goals & Plans:  Self Care Goal 12/01/2013  Manage my medications take my medicines as prescribed; bring my medications to every visit; refill my medications on time; follow the sick day instructions if I am sick  Monitor my health keep track of my blood pressure; keep track of my weight  Eat healthy foods eat more vegetables; eat fruit for snacks and desserts; eat foods that are low in salt; eat baked foods instead of fried foods; eat smaller portions; drink diet soda or water instead of juice or soda  Be physically active find an activity I enjoy  Stop smoking call [facility] (1-800-QUIT-NOW)  Meeting treatment goals maintain the current self-care plan    No flowsheet data found.   Care Management & Community Referrals:  Referral 12/01/2013  Referrals made for care management support none needed  Referrals made to community resources none       Ischemic Stroke A stroke (cerebrovascular accident) is the sudden death of brain tissue. It is a medical emergency. A stroke can cause permanent loss of brain function. This can cause problems with different parts of your body. A transient ischemic attack (TIA) is different because it does not cause permanent damage. A TIA is a short-lived problem of poor blood  flow affecting a part of the brain. A TIA is also a serious problem because having a TIA greatly increases the chances of having a stroke. When symptoms first develop, you cannot know if the problem might be a stroke or TIA. CAUSES  A stroke is caused by a decrease of oxygen supply to an area of your brain. It is usually the result of a small blood clot or collection of cholesterol or fat (plaque) that blocks blood flow in the brain. A stroke can also be caused by blocked or damaged carotid arteries.  RISK FACTORS  High blood pressure (hypertension).  High cholesterol.  Diabetes mellitus.  Heart disease.  The build up of plaque in the blood vessels (peripheral artery disease or atherosclerosis).  The build up of plaque in the blood vessels providing blood and oxygen to the brain (carotid artery stenosis).  An abnormal heart rhythm (atrial fibrillation).  Obesity.  Smoking.  Taking oral contraceptives (especially in combination with smoking).  Physical inactivity.  A diet high in fats, salt (sodium), and calories.  Alcohol use.  Use of illegal drugs (especially cocaine and methamphetamine).  Being African American.  Being over the age of 34.  Family history of stroke.  Previous history of blood clots, stroke, TIA, or heart attack.  Sickle cell disease. SYMPTOMS  These symptoms usually develop suddenly, or may be newly present upon awakening from sleep:  Sudden weakness or numbness of the face, arm, or leg, especially on one side of the body.  Sudden trouble walking or difficulty moving arms or legs.  Sudden confusion.  Sudden personality changes.  Trouble speaking (aphasia) or understanding.  Difficulty swallowing.  Sudden trouble seeing in one or both eyes.  Double vision.  Dizziness.  Loss of balance or coordination.  Sudden severe headache with no known cause.  Trouble reading or writing. DIAGNOSIS  Your caregiver can often determine the presence  or absence of a stroke based on your symptoms, history, and physical exam. Computed tomography (CT) of the brain is usually performed to confirm the stroke, determine causes, and determine stroke severity. Other tests may be done to find the cause of the stroke. These tests may include:  Electrocardiography.  Continuous heart monitoring.  Echocardiography.  Carotid ultrasonography.  Magnetic resonance imaging (MRI).  A scan of the brain circulation.  Blood tests. PREVENTION  The risk of a stroke can be decreased by appropriately treating high blood pressure, high cholesterol, diabetes, heart disease, and obesity and by quitting smoking, limiting alcohol, and staying physically active. TREATMENT  Time is of the essence. It is important to seek treatment within 3 4 hours of the start of symptoms because you may receive a medicine to dissolve the clot (thrombolytic) that cannot be given after that time. Even if you do not know when your symptoms began, get treatment as soon as possible. After the 4 hour window has passed, treatment may include rest, oxygen, intravenous (IV) fluids, and medicines to thin the blood (anticoagulants). Treatment of stroke depends on the duration, severity, and cause of your symptoms. Medicines and diet may be used to address diabetes, high blood pressure, and other risk factors. Physical, speech, and occupational therapists will assess you and work to improve any functions impaired by the stroke. Measures will be taken to prevent short-term and long-term complications, including infection from breathing foreign material into the lungs (aspiration pneumonia), blood clots in the legs, bedsores, and falls. Rarely, surgery may be needed to remove large blood clots or to open up blocked arteries. HOME CARE INSTRUCTIONS   Take all medicines prescribed by your caregiver. Follow the directions carefully. Medicines may be used to control risk factors for a stroke. Be sure you  understand all your medicine instructions.  You may be told to take aspirin or the anticoagulant warfarin. Warfarin needs to be taken exactly as instructed.  Too much and too little warfarin are both dangerous. Too much warfarin increases the risk of bleeding. Too little warfarin continues to allow the risk for blood clots. While taking warfarin, you will need to have regular blood tests to measure your blood clotting time. These blood tests usually include both the PT and INR tests. The PT and INR results allow your caregiver to adjust your dose of warfarin. The dose can change for many reasons. It is critically important that you take warfarin exactly as prescribed, and that you have your PT and INR levels drawn exactly as directed.  Many foods, especially foods high in vitamin K can interfere with warfarin and affect the PT and INR results. Foods high in vitamin K include spinach, kale, broccoli, cabbage, collard and turnip greens, brussels sprouts, peas, cauliflower, seaweed, and parsley as well as beef and pork liver, green tea, and soybean oil. You should eat a consistent amount  of foods high in vitamin K. Avoid major changes in your diet, or notify your caregiver before changing your diet. Arrange a visit with a dietitian to answer your questions.  Many medicines can interfere with warfarin and affect the PT and INR results. You must tell your caregiver about any and all medicines you take, this includes all vitamins and supplements. Be especially cautious with aspirin and anti-inflammatory medicines. Do not take or discontinue any prescribed or over-the-counter medicine except on the advice of your caregiver or pharmacist.  Warfarin can have side effects, such as excessive bruising or bleeding. You will need to hold pressure over cuts for longer than usual. Your caregiver or pharmacist will discuss other potential side effects.  Avoid sports or activities that may cause injury or bleeding.  Be  mindful when shaving, flossing your teeth, or handling sharp objects.  Alcohol can change the body's ability to handle warfarin. It is best to avoid alcoholic drinks or consume only very small amounts while taking warfarin. Notify your caregiver if you change your alcohol intake.  Notify your dentist or other caregivers before procedures.  If swallow studies have determined that your swallowing reflex is present, you should eat healthy foods. A diet that includes 5 or more servings of fruits and vegetables a day may reduce the risk of stroke. Foods may need to be a special consistency (soft or pureed), or small bites may need to be taken in order to avoid aspirating or choking. Certain diets may be prescribed to address high blood pressure, high cholesterol, diabetes, or obesity.  A low-sodium, low-saturated fat, low-trans fat, low-cholesterol diet is recommended to manage high blood pressure.  A low-saturated fat, low-trans fat, low-cholesterol, and high-fiber diet may control cholesterol levels.  A controlled-carbohydrate, controlled-sugar diet is recommended to manage diabetes.  A reduced-calorie, low-sodium, low-saturated fat, low-trans fat, low-cholesterol diet is recommended to manage obesity.  Maintain a healthy weight.  Stay physically active. It is recommended that you get at least 30 minutes of activity on most or all days.  Do not smoke.  Limit alcohol use even if you are not taking warfarin. Moderate alcohol use is considered to be:  No more than 2 drinks each day for men.  No more than 1 drink each day for nonpregnant women.  Stop drug abuse.  Home safety. A safe home environment is important to reduce the risk of falls. Your caregiver may arrange for specialists to evaluate your home. Having grab bars in the bedroom and bathroom is often important. Your caregiver may arrange for equipment to be used at home, such as raised toilets and a seat for the shower.  Physical,  occupational, and speech therapy. Ongoing therapy may be needed to maximize your recovery after a stroke. If you have been advised to use a walker or a cane, use it at all times. Be sure to keep your therapy appointments.  Follow all instructions for follow-up with your caregiver. This is very important. This includes any referrals, physical therapy, rehabilitation, and lab tests. Proper follow up can prevent another stroke from occurring. SEEK MEDICAL CARE IF:  You have personality changes.  You have difficulty swallowing.  You are seeing double.  You have dizziness.  You have a fever.  You have skin breakdown. SEEK IMMEDIATE MEDICAL CARE IF:  Any of these symptoms may represent a serious problem that is an emergency. Do not wait to see if the symptoms will go away. Get medical help right away. Call your local  emergency services (911 in [place] Do not drive yourself to the hospital.  You have sudden weakness or numbness of the face, arm, or leg, especially on one side of the body.  You have sudden trouble walking or difficulty moving arms or legs.  You have sudden confusion.  You have trouble speaking (aphasia) or understanding.  You have sudden trouble seeing in one or both eyes.  You have a loss of balance or coordination.  You have a sudden, severe headache with no known cause.  You have new chest pain or an irregular heartbeat.  You have a partial or total loss of consciousness.   Document Released: 07/27/2005 Document Revised: 03/29/2013 Document Reviewed: 03/06/2012 [facility] Patient Information 2014 [place], [place].  Stroke Prevention Some medical conditions and behaviors are associated with an increased chance of having a stroke. You may prevent a stroke by making healthy choices and managing medical conditions. HOW CAN I REDUCE MY RISK OF HAVING A STROKE?   Stay physically active. Get at least 30 minutes of activity on most or all days.  Do not smoke. It may  also be helpful to avoid exposure to secondhand smoke.  Limit alcohol use. Moderate alcohol use is considered to be:  No more than 2 drinks per day for men.  No more than 1 drink per day for nonpregnant women.  Eat healthy foods. This involves  Eating 5 or more servings of fruits and vegetables a day.  Following a diet that addresses high blood pressure (hypertension), high cholesterol, diabetes, or obesity.  Manage your cholesterol levels.  A diet low in saturated fat, trans fat, and cholesterol and high in fiber may control cholesterol levels.  Take any prescribed medicines to control cholesterol as directed by your health care provider.  Manage your diabetes.  A controlled-carbohydrate, controlled-sugar diet is recommended to manage diabetes.  Take any prescribed medicines to control diabetes as directed by your health care provider.  Control your hypertension.  A low-salt (sodium), low-saturated fat, low-trans fat, and low-cholesterol diet is recommended to manage hypertension.  Take any prescribed medicines to control hypertension as directed by your health care provider.  Maintain a healthy weight.  A reduced-calorie, low-sodium, low-saturated fat, low-trans fat, low-cholesterol diet is recommended to manage weight.  Stop drug abuse.  Avoid taking birth control pills.  Talk to your health care provider about the risks of taking birth control pills if you are over 5 years old, smoke, get migraines, or have ever had a blood clot.  Get evaluated for sleep disorders (sleep apnea).  Talk to your health care provider about getting a sleep evaluation if you snore a lot or have excessive sleepiness.  Take medicines as directed by your health care provider.  For some people, aspirin or blood thinners (anticoagulants) are helpful in reducing the risk of forming abnormal blood clots that can lead to stroke. If you have the irregular heart rhythm of atrial fibrillation, you  should be on a blood thinner unless there is a good reason you cannot take them.  Understand all your medicine instructions.  Make sure that other other conditions (such as anemia or atherosclerosis) are addressed. SEEK IMMEDIATE MEDICAL CARE IF:   You have sudden weakness or numbness of the face, arm, or leg, especially on one side of the body.  Your face or eyelid droops to one side.  You have sudden confusion.  You have trouble speaking (aphasia) or understanding.  You have sudden trouble seeing in one or both eyes.  You have sudden trouble walking.  You have dizziness.  You have a loss of balance or coordination.  You have a sudden, severe headache with no known cause.  You have new chest pain or an irregular heartbeat. Any of these symptoms may represent a serious problem that is an emergency. Do not wait to see if the symptoms will go away. Get medical help at once. Call your local emergency services  (911 in [place] Do not drive yourself to the hospital. Document Released: 09/03/2004 Document Revised: 05/17/2013 Document Reviewed: 01/27/2013 [facility] Patient Information 2014 [place].  Hypertension As your heart beats, it forces blood through your arteries. This force is your blood pressure. If the pressure is too high, it is called hypertension (HTN) or high blood pressure. HTN is dangerous because you may have it and not know it. High blood pressure may mean that your heart has to work harder to pump blood. Your arteries may be narrow or stiff. The extra work puts you at risk for heart disease, stroke, and other problems.  Blood pressure consists of two numbers, a higher number over a lower, 110/72, for example. It is stated as "110 over 72." The ideal is below 120 for the top number (systolic) and under 80 for the bottom (diastolic). Write down your blood pressure today. You should pay close attention to your blood pressure if you have certain conditions such  as:  Heart failure.  Prior heart attack.  Diabetes  Chronic kidney disease.  Prior stroke.  Multiple risk factors for heart disease. To see if you have HTN, your blood pressure should be measured while you are seated with your arm held at the level of the heart. It should be measured at least twice. A one-time elevated blood pressure reading (especially in the Emergency Department) does not mean that you need treatment. There may be conditions in which the blood pressure is different between your right and left arms. It is important to see your caregiver soon for a recheck. Most people have essential hypertension which means that there is not a specific cause. This type of high blood pressure may be lowered by changing lifestyle factors such as:  Stress.  Smoking.  Lack of exercise.  Excessive weight.  Drug/tobacco/alcohol use.  Eating less salt. Most people do not have symptoms from high blood pressure until it has caused damage to the body. Effective treatment can often prevent, delay or reduce that damage. TREATMENT  When a cause has been identified, treatment for high blood pressure is directed at the cause. There are a large number of medications to treat HTN. These fall into several categories, and your caregiver will help you select the medicines that are best for you. Medications may have side effects. You should review side effects with your caregiver. If your blood pressure stays high after you have made lifestyle changes or started on medicines,   Your medication(s) may need to be changed.  Other problems may need to be addressed.  Be certain you understand your prescriptions, and know how and when to take your medicine.  Be sure to follow up with your caregiver within the time frame advised (usually within two weeks) to have your blood pressure rechecked and to review your medications.  If you are taking more than one medicine to lower your blood pressure, make  sure you know how and at what times they should be taken. Taking two medicines at the same time can result in blood pressure that is  too low. SEEK IMMEDIATE MEDICAL CARE IF:  You develop a severe headache, blurred or changing vision, or confusion.  You have unusual weakness or numbness, or a faint feeling.  You have severe chest or abdominal pain, vomiting, or breathing problems. MAKE SURE YOU:   Understand these instructions.  Will watch your condition.  Will get help right away if you are not doing well or get worse. Document Released: 07/27/2005 Document Revised: 10/19/2011 Document Reviewed: 03/16/2008 [facility] Patient Information 2014 [place].  Smoking Cessation Quitting smoking is important to your health and has many advantages. However, it is not always easy to quit since nicotine is a very addictive drug. Often times, people try 3 times or more before being able to quit. This document explains the best ways for you to prepare to quit smoking. Quitting takes hard work and a lot of effort, but you can do it. ADVANTAGES OF QUITTING SMOKING  You will live longer, feel better, and live better.  Your body will feel the impact of quitting smoking almost immediately.  Within 20 minutes, blood pressure decreases. Your pulse returns to its normal level.  After 8 hours, carbon monoxide levels in the blood return to normal. Your oxygen level increases.  After 24 hours, the chance of having a heart attack starts to decrease. Your breath, hair, and body stop smelling like smoke.  After 48 hours, damaged nerve endings begin to recover. Your sense of taste and smell improve.  After 72 hours, the body is virtually free of nicotine. Your bronchial tubes relax and breathing becomes easier.  After 2 to 12 weeks, lungs can hold more air. Exercise becomes easier and circulation improves.  The risk of having a heart attack, stroke, cancer, or lung disease is greatly reduced.  After  1 year, the risk of coronary heart disease is cut in half.  After 5 years, the risk of stroke falls to the same as a nonsmoker.  After 10 years, the risk of lung cancer is cut in half and the risk of other cancers decreases significantly.  After 15 years, the risk of coronary heart disease drops, usually to the level of a nonsmoker.  If you are pregnant, quitting smoking will improve your chances of having a healthy baby.  The people you live with, especially any children, will be healthier.  You will have extra money to spend on things other than cigarettes. QUESTIONS TO THINK ABOUT BEFORE ATTEMPTING TO QUIT You may want to talk about your answers with your caregiver.  Why do you want to quit?  If you tried to quit in the past, what helped and what did not?  What will be the most difficult situations for you after you quit? How will you plan to handle them?  Who can help you through the tough times? Your family? Friends? A caregiver?  What pleasures do you get from smoking? What ways can you still get pleasure if you quit? Here are some questions to ask your caregiver:  How can you help me to be successful at quitting?  What medicine do you think would be best for me and how should I take it?  What should I do if I need more help?  What is smoking withdrawal like? How can I get information on withdrawal? GET READY  Set a quit date.  Change your environment by getting rid of all cigarettes, ashtrays, matches, and lighters in your home, car, or work. Do not let people smoke in your home.  Review your past attempts to quit. Think about what worked and what did not. GET SUPPORT AND ENCOURAGEMENT You have a better chance of being successful if you have help. You can get support in many ways.  Tell your family, friends, and co-workers that you are going to quit and need their support. Ask them not to smoke around you.  Get individual, group, or telephone counseling and  support. Programs are available at [employer] and health centers. Call your local health department for information about programs in your area.  Spiritual beliefs and practices may help some smokers quit.  Download a "quit meter" on your computer to keep track of quit statistics, such as how long you have gone without smoking, cigarettes not smoked, and money saved.  Get a self-help book about quitting smoking and staying off of tobacco. [place] yourself from urges to smoke. Talk to someone, go for a walk, or occupy your time with a task.  Change your normal routine. Take a different route to work. Drink tea instead of coffee. Eat breakfast in a different place.  Reduce your stress. Take a hot bath, exercise, or read a book.  Plan something enjoyable to do every day. Reward yourself for not smoking.  Explore interactive web-based programs that specialize in helping you quit. GET MEDICINE AND USE IT CORRECTLY Medicines can help you stop smoking and decrease the urge to smoke. Combining medicine with the above behavioral methods and support can greatly increase your chances of successfully quitting smoking.  Nicotine replacement therapy helps deliver nicotine to your body without the negative effects and risks of smoking. Nicotine replacement therapy includes nicotine gum, lozenges, inhalers, nasal sprays, and skin patches. Some may be available over-the-counter and others require a prescription.  Antidepressant medicine helps people abstain from smoking, but how this works is unknown. This medicine is available by prescription.  Nicotinic receptor partial agonist medicine simulates the effect of nicotine in your brain. This medicine is available by prescription. Ask your caregiver for advice about which medicines to use and how to use them based on your health history. Your caregiver will tell you what side effects to look out for if you choose to be on  a medicine or therapy. Carefully read the information on the package. Do not use any other product containing nicotine while using a nicotine replacement product.  RELAPSE OR DIFFICULT SITUATIONS Most relapses occur within the first 3 months after quitting. Do not be discouraged if you start smoking again. Remember, most people try several times before finally quitting. You may have symptoms of withdrawal because your body is used to nicotine. You may crave cigarettes, be irritable, feel very hungry, cough often, get headaches, or have difficulty concentrating. The withdrawal symptoms are only temporary. They are strongest when you first quit, but they will go away within 10 14 days. To reduce the chances of relapse, try to:  Avoid drinking alcohol. Drinking lowers your chances of successfully quitting.  Reduce the amount of caffeine you consume. Once you quit smoking, the amount of caffeine in your body increases and can give you symptoms, such as a rapid heartbeat, sweating, and anxiety.  Avoid smokers because they can make you want to smoke.  Do not let weight gain distract you. Many smokers will gain weight when they quit, usually less than 10 pounds. Eat a healthy diet and stay active. You can always lose the weight gained after you quit.  Find ways to  improve your mood other than smoking. FOR MORE INFORMATION  www.smokefree.gov  Document Released: 07/21/2001 Document Revised: 01/26/2012 Document Reviewed: 11/05/2011 [facility] Patient Information 2014 [place], [place].  Fall Prevention and Home Safety Falls cause injuries and can affect all age groups. It is possible to prevent falls.  HOW TO PREVENT FALLS  Wear shoes with rubber soles that do not have an opening for your toes.  Keep the inside and outside of your house well lit.  Use night lights throughout your home.  Remove clutter from floors.  Clean up floor spills.  Remove throw rugs or fasten them to the floor with carpet  tape.  Do not place electrical cords across pathways.  Put grab bars by your tub, shower, and toilet. Do not use towel bars as grab bars.  Put handrails on both sides of the stairway. Fix loose handrails.  Do not climb on stools or stepladders, if possible.  Do not wax your floors.  Repair uneven or unsafe sidewalks, walkways, or stairs.  Keep items you use a lot within reach.  Be aware of pets.  Keep emergency numbers next to the telephone.  Put smoke detectors in your home and near bedrooms. Ask your doctor what other things you can do to prevent falls. Document Released: 05/23/2009 Document Revised: 01/26/2012 Document Reviewed: 10/27/2011 [facility] Patient Information 2014 [place], [place].

## 2013-12-01 NOTE — Assessment & Plan Note (Signed)
Will check BMET today

## 2013-12-01 NOTE — Progress Notes (Signed)
INTERNAL MEDICINE TEACHING ATTENDING ADDENDUM - Seiji Wiswell, MD: I reviewed and discussed at the time of visit with the resident Dr. McLean, the patient's medical history, physical examination, diagnosis and results of tests and treatment and I agree with the patient's care as documented. 

## 2013-12-01 NOTE — Progress Notes (Addendum)
   Subjective:    Patient ID: [name], male    DOB: [birth year], 63 y.o.   MRN: [id number]  HPI Comments: 63 y.o. male w/ PMHx of HTN (105/67), COPD, metastatic prostate cancer, stroke with weakness, poor dentition, b/l LE weakness, alcohol abuse, tobacco abuse (still smoking 1/2 ppd down from 1 ppd).  Wt 163 lbs  He presents for:  1) Medication refill (Lisinopril, Plavix). He states compliance with plavix  2) s/p stroke 10/2013 right anterior cerebral artery and extensive atherosclerotic disease per MRI/MRA 11/2014; he is waiting to start H/H PT (he had 2/3 treatment sessions so far).  He wants outpt PT.  His aid is with him at the appt and states yesterday pt had an episode where he couldn't get up.  He was not lightheaded/dizzy but did feel weak on the left side of his body and required more assistance than normal per his aid. He did not make it to the bathroom in time and used the bathroom on himself. Per the patient today his strength is better       Review of Systems  HENT: Negative for trouble swallowing.   Respiratory: Negative for shortness of breath.   Cardiovascular: Negative for chest pain and leg swelling.  Neurological: Positive for weakness. Negative for dizziness and light-headedness.       Objective:   Physical Exam  Nursing note and vitals reviewed. Constitutional: He is oriented to person, place, and time. Vital signs are normal. He appears well-developed and well-nourished. He is cooperative.  HENT:  Head: Normocephalic and atraumatic.  Mouth/Throat: Oropharynx is clear and moist and mucous membranes are normal. Abnormal dentition. No oropharyngeal exudate.  No teeth  Eyes: Conjunctivae are normal. Pupils are equal, round, and reactive to light. Right eye exhibits no discharge. Left eye exhibits no discharge. No scleral icterus.  Cardiovascular: Normal rate, regular rhythm, S1 normal, S2 normal and normal heart sounds.   No murmur heard. No lower ext edema    Pulmonary/Chest: Effort normal and breath sounds normal.  Abdominal: Soft. Bowel sounds are normal. He exhibits no distension. There is no tenderness.  Musculoskeletal: He exhibits no edema.  Neurological: He is alert and oriented to person, place, and time. No sensory deficit.  In wheelchair  CN 2-12 grossly intact  Strength intact right arm/leg Left arm stronger than left leg 4+, 3+ to 4- respectively  Skin: Skin is warm, dry and intact. No rash noted.  Psychiatric: He has a normal mood and affect. His speech is normal and behavior is normal. Judgment and thought content normal. Cognition and memory are normal.          Assessment & Plan:  F/u with neurology next week otherwise [facility] clinic 3 months

## 2013-12-01 NOTE — Assessment & Plan Note (Addendum)
11/23/13 MRI No evidence of new infarction or extension of the anterior cerebral  artery infarction on the right. Subacute infarction in that vascular territory. Old right cerebellar and right lateral medulla infarction. Chronic small-vessel changes elsewhere throughout the brain  11/23/13 MRA Multi focal irregularity within the cavernous segments of the internal carotid arteries bilaterally, left greater than right. There is associated stenosis of up to 60-70% within the cavernous  segment of the left internal carotid artery. Overall, these findings are not significantly changed relative to prior MRA from 12/05/2012. 2. Occlusion of the distal right vertebral artery, unchanged. The left vertebral artery is dominant and widely patent to the level of the vertebrobasilar junction. 3. Scattered multi focal atherosclerotic irregularity within the anterior and middle cerebral artery branches bilaterally, left greater than right. Findings are grossly stable relative to prior MRA from 12/05/2012. 4. No intracranial aneurysm  Reviewed MRI/A. Patient has not followed up with neurology. [facility] for appt ASAP but they will call me back and I will call the aid back Bernadette [phone].  I dont think patient is having acute stroke likely decline in motor strength due to old infarction as he just had imaging 11/23/13.   Will order bedside commode  Will check BMET today to check K level. If low will supp Rx refill Plavix today  RTC or ED if symptoms worsening

## 2013-12-01 NOTE — Assessment & Plan Note (Signed)
BP Readings from Last 3 Encounters:  12/01/13 105/67  11/24/13 138/76  11/21/13 117/78    Lab Results  Component Value Date   NA 140 11/24/2013   K 3.5* 11/24/2013   CREATININE 0.97 11/24/2013    Assessment: Blood pressure control: controlled Progress toward BP goal:  at goal Comments: none  Plan: Medications:  continue current medications Rx refill Lisinopril  Educational resources provided: brochure;handout;video;other (see comments) Self management tools provided: other (see comments) Other plans: will check BMET today

## 2013-12-01 NOTE — Telephone Encounter (Signed)
Consulted Dr. Leonie Man about pt.  He stated to bring in on Monday with him, 12-04-13 1445.   I called pt to schedule and he relayed to call [name] [phone].  I spoke to Ambrose, and she will bring pt on Monday with Dr. Leonie Man at 12-04-13 at 1445.  Notified Dr. Aundra Dubin.  Unfortunately I cannot place on schedule, but will let  Dr. Clydene Fake assistant know for Monday.

## 2013-12-02 ENCOUNTER — Inpatient Hospital Stay (HOSPITAL_COMMUNITY)
Admission: EM | Admit: 2013-12-02 | Discharge: 2013-12-05 | DRG: 100 | Disposition: A | Discharge status: Home / Self Care | Payer: Medicaid Other | Attending: Internal Medicine | Admitting: Internal Medicine

## 2013-12-02 ENCOUNTER — Encounter (HOSPITAL_COMMUNITY): Payer: Self-pay | Admitting: Emergency Medicine

## 2013-12-02 ENCOUNTER — Other Ambulatory Visit: Payer: Self-pay

## 2013-12-02 ENCOUNTER — Emergency Department (HOSPITAL_COMMUNITY): Payer: Medicaid Other

## 2013-12-02 DIAGNOSIS — Z720 Tobacco use: Secondary | ICD-10-CM | POA: Diagnosis present

## 2013-12-02 DIAGNOSIS — D649 Anemia, unspecified: Secondary | ICD-10-CM | POA: Diagnosis present

## 2013-12-02 DIAGNOSIS — N289 Disorder of kidney and ureter, unspecified: Secondary | ICD-10-CM

## 2013-12-02 DIAGNOSIS — I639 Cerebral infarction, unspecified: Secondary | ICD-10-CM

## 2013-12-02 DIAGNOSIS — E213 Hyperparathyroidism, unspecified: Secondary | ICD-10-CM | POA: Diagnosis present

## 2013-12-02 DIAGNOSIS — F17200 Nicotine dependence, unspecified, uncomplicated: Secondary | ICD-10-CM | POA: Diagnosis present

## 2013-12-02 DIAGNOSIS — R569 Unspecified convulsions: Principal | ICD-10-CM | POA: Diagnosis present

## 2013-12-02 DIAGNOSIS — Z7902 Long term (current) use of antithrombotics/antiplatelets: Secondary | ICD-10-CM

## 2013-12-02 DIAGNOSIS — E876 Hypokalemia: Secondary | ICD-10-CM | POA: Diagnosis present

## 2013-12-02 DIAGNOSIS — I129 Hypertensive chronic kidney disease with stage 1 through stage 4 chronic kidney disease, or unspecified chronic kidney disease: Secondary | ICD-10-CM | POA: Diagnosis present

## 2013-12-02 DIAGNOSIS — Z8673 Personal history of transient ischemic attack (TIA), and cerebral infarction without residual deficits: Secondary | ICD-10-CM

## 2013-12-02 DIAGNOSIS — I10 Essential (primary) hypertension: Secondary | ICD-10-CM | POA: Diagnosis present

## 2013-12-02 DIAGNOSIS — J4489 Other specified chronic obstructive pulmonary disease: Secondary | ICD-10-CM | POA: Diagnosis present

## 2013-12-02 DIAGNOSIS — S98139A Complete traumatic amputation of one unspecified lesser toe, initial encounter: Secondary | ICD-10-CM

## 2013-12-02 DIAGNOSIS — R531 Weakness: Secondary | ICD-10-CM | POA: Diagnosis present

## 2013-12-02 DIAGNOSIS — D509 Iron deficiency anemia, unspecified: Secondary | ICD-10-CM

## 2013-12-02 DIAGNOSIS — N179 Acute kidney failure, unspecified: Secondary | ICD-10-CM | POA: Diagnosis present

## 2013-12-02 DIAGNOSIS — J449 Chronic obstructive pulmonary disease, unspecified: Secondary | ICD-10-CM

## 2013-12-02 DIAGNOSIS — Z8546 Personal history of malignant neoplasm of prostate: Secondary | ICD-10-CM

## 2013-12-02 DIAGNOSIS — N189 Chronic kidney disease, unspecified: Secondary | ICD-10-CM | POA: Diagnosis present

## 2013-12-02 DIAGNOSIS — Z79899 Other long term (current) drug therapy: Secondary | ICD-10-CM

## 2013-12-02 DIAGNOSIS — IMO0002 Reserved for concepts with insufficient information to code with codable children: Secondary | ICD-10-CM

## 2013-12-02 DIAGNOSIS — E43 Unspecified severe protein-calorie malnutrition: Secondary | ICD-10-CM | POA: Diagnosis present

## 2013-12-02 DIAGNOSIS — R972 Elevated prostate specific antigen [PSA]: Secondary | ICD-10-CM

## 2013-12-02 LAB — CBC
HCT: 25.3 % — ABNORMAL LOW (ref 39.0–52.0)
HEMOGLOBIN: 8.6 g/dL — AB (ref 13.0–17.0)
MCH: 28.7 pg (ref 26.0–34.0)
MCHC: 34 g/dL (ref 30.0–36.0)
MCV: 84.3 fL (ref 78.0–100.0)
Platelets: 282 10*3/uL (ref 150–400)
RBC: 3 MIL/uL — AB (ref 4.22–5.81)
RDW: 14.1 % (ref 11.5–15.5)
WBC: 7 10*3/uL (ref 4.0–10.5)

## 2013-12-02 LAB — URINALYSIS, ROUTINE W REFLEX MICROSCOPIC
GLUCOSE, UA: NEGATIVE mg/dL
Hgb urine dipstick: NEGATIVE
Ketones, ur: 15 mg/dL — AB
Leukocytes, UA: NEGATIVE
Nitrite: NEGATIVE
PROTEIN: NEGATIVE mg/dL
Specific Gravity, Urine: 1.023 (ref 1.005–1.030)
Urobilinogen, UA: 0.2 mg/dL (ref 0.0–1.0)
pH: 5 (ref 5.0–8.0)

## 2013-12-02 LAB — BASIC METABOLIC PANEL
BUN: 32 mg/dL — AB (ref 6–23)
CO2: 23 mEq/L (ref 19–32)
Calcium: 10.7 mg/dL — ABNORMAL HIGH (ref 8.4–10.5)
Chloride: 102 mEq/L (ref 96–112)
Creatinine, Ser: 1.99 mg/dL — ABNORMAL HIGH (ref 0.50–1.35)
GFR, EST AFRICAN AMERICAN: 39 mL/min — AB (ref >90)
GFR, EST NON AFRICAN AMERICAN: 34 mL/min — AB (ref >90)
Glucose, Bld: 109 mg/dL — ABNORMAL HIGH (ref 70–99)
Potassium: 3.1 mEq/L — ABNORMAL LOW (ref 3.7–5.3)
SODIUM: 139 meq/L (ref 137–147)

## 2013-12-02 LAB — RAPID URINE DRUG SCREEN, HOSP PERFORMED
Amphetamines: NOT DETECTED
Barbiturates: NOT DETECTED
Benzodiazepines: NOT DETECTED
Cocaine: NOT DETECTED
Opiates: NOT DETECTED
TETRAHYDROCANNABINOL: NOT DETECTED

## 2013-12-02 LAB — SODIUM, URINE, RANDOM: Sodium, Ur: 68 mEq/L

## 2013-12-02 LAB — TROPONIN I

## 2013-12-02 LAB — MAGNESIUM: Magnesium: 1.8 mg/dL (ref 1.5–2.5)

## 2013-12-02 LAB — CK: Total CK: 187 U/L (ref 7–232)

## 2013-12-02 LAB — CREATININE, URINE, RANDOM: CREATININE, URINE: 421.89 mg/dL

## 2013-12-02 MED ORDER — HEPARIN SODIUM (PORCINE) 5000 UNIT/ML IJ SOLN
5000.0000 [IU] | Freq: Three times a day (TID) | INTRAMUSCULAR | Status: DC
  Administered 2013-12-02 – 2013-12-05 (×8): 5000 [IU] via SUBCUTANEOUS
  Filled 2013-12-02 (×11): qty 1

## 2013-12-02 MED ORDER — ATORVASTATIN CALCIUM 80 MG PO TABS
80.0000 mg | ORAL_TABLET | Freq: Every day | ORAL | Status: DC
  Administered 2013-12-02 – 2013-12-05 (×4): 80 mg via ORAL
  Filled 2013-12-02 (×4): qty 1

## 2013-12-02 MED ORDER — POTASSIUM CHLORIDE 20 MEQ/15ML (10%) PO LIQD
40.0000 meq | Freq: Once | ORAL | Status: AC
  Administered 2013-12-02: 40 meq via ORAL
  Filled 2013-12-02: qty 30

## 2013-12-02 MED ORDER — POTASSIUM CHLORIDE IN NACL 20-0.9 MEQ/L-% IV SOLN
INTRAVENOUS | Status: AC
  Administered 2013-12-02: 19:00:00 via INTRAVENOUS
  Filled 2013-12-02 (×3): qty 1000

## 2013-12-02 MED ORDER — LEVETIRACETAM 500 MG PO TABS
500.0000 mg | ORAL_TABLET | Freq: Once | ORAL | Status: AC
  Administered 2013-12-02: 500 mg via ORAL
  Filled 2013-12-02: qty 1

## 2013-12-02 MED ORDER — TIOTROPIUM BROMIDE MONOHYDRATE 18 MCG IN CAPS
18.0000 ug | ORAL_CAPSULE | Freq: Every day | RESPIRATORY_TRACT | Status: DC
  Administered 2013-12-02 – 2013-12-05 (×4): 18 ug via RESPIRATORY_TRACT
  Filled 2013-12-02: qty 5

## 2013-12-02 MED ORDER — ADULT MULTIVITAMIN W/MINERALS CH
1.0000 | ORAL_TABLET | Freq: Every day | ORAL | Status: DC
  Administered 2013-12-02 – 2013-12-05 (×4): 1 via ORAL
  Filled 2013-12-02 (×4): qty 1

## 2013-12-02 MED ORDER — SODIUM CHLORIDE 0.9 % IJ SOLN
3.0000 mL | Freq: Two times a day (BID) | INTRAMUSCULAR | Status: DC
  Administered 2013-12-02 – 2013-12-05 (×5): 3 mL via INTRAVENOUS

## 2013-12-02 MED ORDER — SODIUM CHLORIDE 0.9 % IV BOLUS (SEPSIS)
500.0000 mL | Freq: Once | INTRAVENOUS | Status: AC
  Administered 2013-12-02: 500 mL via INTRAVENOUS

## 2013-12-02 MED ORDER — CLOPIDOGREL BISULFATE 75 MG PO TABS
75.0000 mg | ORAL_TABLET | Freq: Every day | ORAL | Status: DC
  Administered 2013-12-03 – 2013-12-05 (×3): 75 mg via ORAL
  Filled 2013-12-02 (×4): qty 1

## 2013-12-02 MED ORDER — ALBUTEROL SULFATE HFA 108 (90 BASE) MCG/ACT IN AERS: 2.0000 | INHALATION_SPRAY | Freq: Four times a day (QID) | RESPIRATORY_TRACT | Status: DC | PRN

## 2013-12-02 MED ORDER — LORAZEPAM 2 MG/ML IJ SOLN: 1.0000 mg | INTRAMUSCULAR | Status: DC | PRN

## 2013-12-02 MED ORDER — ALBUTEROL SULFATE (2.5 MG/3ML) 0.083% IN NEBU: 2.5000 mg | INHALATION_SOLUTION | Freq: Four times a day (QID) | RESPIRATORY_TRACT | Status: DC | PRN

## 2013-12-02 NOTE — Consult Note (Addendum)
NEURO HOSPITALIST CONSULT NOTE    Reason for Consult:seizures  HPI:                                                                                                                                          [name] is an 63 y.o. male, right handed, with a past medical history significant for HTN, recent R ACA stroke (March 2015 with residual left sided weakness, COPD, prostate, cancer, admitted to [facility] after sustaining a possible seizure after having a CT scan today. Family is not available to obtain further information at this moment, but I was told by the ED physician that family reported " he had had several seizures at home" , although this is not documented in his medical chart. [name] said that he recalls some details about his seizure today " but no everything". As per ED staff he did not have a post-ictal state following the witnessed seizure today. No alcohol intake, use of illicit drugs, or benzodiazepines. No recent fevers. CT brain showed no acute intracranial abnormality.    Past Medical History  Diagnosis Date  . Hypertension 05/18/2007  . COPD 05/18/2007  . Elevated PSA 05/25/2007  . 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 Stroke 12/05/2012  . Prostate cancer     Past Surgical History  Procedure Laterality Date  . 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 Multiple extractions with alveoloplasty N/A 10/23/2013    Procedure: Extraction of tooth #'s 1,2,3,4,5,6,7,11,12,13,14,15,16,20,21,22,23,24,27,28,29,32 with alveoloplasty and bilateral maxillary tuberosity reductions;  Surgeon: Lenn Cal, DDS;  Location: [facility];  Service: Oral Surgery;  Laterality: N/A;  . Leg surgery      pin  right leg    Family History  Problem Relation Age of Onset  . Liver disease Mother   . Heart attack Mother 65  . Colon cancer Neg Hx     Social History:  reports that he has been smoking Cigarettes.  He has a 20 pack-year smoking history. He has  never used smokeless tobacco. He reports that he does not drink alcohol or use illicit drugs.  Allergies  Allergen Reactions  . Aspirin Hives  . Penicillins Hives    MEDICATIONS:  I have reviewed the patient's current medications.   ROS:                                                                                                                                       History obtained from the patient and chart review.  General ROS: negative for - chills, fatigue, fever, night sweats, or weight loss Psychological ROS: negative for - behavioral disorder, hallucinations, memory difficulties, mood swings or suicidal ideation Ophthalmic ROS: negative for - blurry vision, double vision, eye pain or loss of vision ENT ROS: negative for - epistaxis, nasal discharge, oral lesions, sore throat, tinnitus or vertigo Allergy and Immunology ROS: negative for - hives or itchy/watery eyes Hematological and Lymphatic ROS: negative for - bleeding problems, bruising or swollen lymph nodes Endocrine ROS: negative for - galactorrhea, hair pattern changes, polydipsia/polyuria or temperature intolerance Respiratory ROS: negative for - cough, hemoptysis, shortness of breath or wheezing Cardiovascular ROS: negative for - chest pain, dyspnea on exertion, edema or irregular heartbeat Gastrointestinal ROS: negative for - abdominal pain, diarrhea, hematemesis, nausea/vomiting or stool incontinence Genito-Urinary ROS: negative for - dysuria, hematuria, incontinence or urinary frequency/urgency Musculoskeletal ROS: negative for - joint swelling Neurological ROS: as noted in HPI Dermatological ROS: negative for rash and skin lesion changes  Physical exam: pleasant male in no apparent distress. Blood pressure 107/60, pulse 85, temperature 97.9 F (36.6 C), temperature source Oral, resp. rate 16, SpO2  98.00%. Head: normocephalic. Neck: supple, no bruits, no JVD. Cardiac: no murmurs. Lungs: clear. Abdomen: soft, no tender, no mass. Extremities: no edema. CV: pulses palpable throughout  Neurologic Examination:                                                                                                      Mental Status: Alert, oriented, thought content appropriate.  Mild dysarthria? without evidence of aphasia.  Able to follow 3 step commands without difficulty. Cranial Nerves: II: Discs flat bilaterally; Visual fields grossly normal, pupils equal, round, reactive to light and accommodation III,IV, VI: ptosis not present, extra-ocular motions intact bilaterally V,VII: smile symmetric, facial light touch sensation normal bilaterally VIII: hearing normal bilaterally IX,X: gag reflex present XI: bilateral shoulder shrug XII: midline tongue extension without atrophy or fasciculations Motor: Significant for mild left HP Tone and bulk:normal tone throughout; no atrophy noted Sensory: Pinprick and light touch intact throughout, bilaterally Deep Tendon Reflexes:  1 all over Plantars: Right: downgoing   Left: downgoing Cerebellar: normal finger-to-nose,  normal heel-to-shin  test Gait: No tested.    Lab Results  Component Value Date/Time   CHOL 95 11/01/2013  9:35 AM    Results for orders placed during the hospital encounter of 12/02/13 (from the past 48 hour(s))  BASIC METABOLIC PANEL     Status: Abnormal   Collection Time    12/02/13  3:14 PM      Result Value Ref Range   Sodium 139  137 - 147 mEq/L   Potassium 3.1 (*) 3.7 - 5.3 mEq/L   Chloride 102  96 - 112 mEq/L   CO2 23  19 - 32 mEq/L   Glucose, Bld 109 (*) 70 - 99 mg/dL   BUN 32 (*) 6 - 23 mg/dL   Creatinine, Ser 1.99 (*) 0.50 - 1.35 mg/dL   Calcium 10.7 (*) 8.4 - 10.5 mg/dL   GFR calc non Af Amer 34 (*) >90 mL/min   GFR calc Af Amer 39 (*) >90 mL/min   Comment: (NOTE)     The eGFR has been calculated using the  CKD EPI equation.     This calculation has not been validated in all clinical situations.     eGFR's persistently <90 mL/min signify possible Chronic Kidney     Disease.  CBC     Status: Abnormal   Collection Time    12/02/13  3:14 PM      Result Value Ref Range   WBC 7.0  4.0 - 10.5 K/uL   RBC 3.00 (*) 4.22 - 5.81 MIL/uL   Hemoglobin 8.6 (*) 13.0 - 17.0 g/dL   HCT 25.3 (*) 39.0 - 52.0 %   MCV 84.3  78.0 - 100.0 fL   MCH 28.7  26.0 - 34.0 pg   MCHC 34.0  30.0 - 36.0 g/dL   RDW 14.1  11.5 - 15.5 %   Platelets 282  150 - 400 K/uL  MAGNESIUM     Status: None   Collection Time    12/02/13  6:05 PM      Result Value Ref Range   Magnesium 1.8  1.5 - 2.5 mg/dL  TROPONIN I     Status: None   Collection Time    12/02/13  6:05 PM      Result Value Ref Range   Troponin I <0.30  <0.30 ng/mL   Comment:            Due to the release kinetics of cTnI,     a negative result within the first hours     of the onset of symptoms does not rule out     myocardial infarction with certainty.     If myocardial infarction is still suspected,     repeat the test at appropriate intervals.  CK     Status: None   Collection Time    12/02/13  6:05 PM      Result Value Ref Range   Total CK 187  7 - 232 U/L    Ct Head Wo Contrast  12/02/2013   CLINICAL DATA:  Seizure like activity.  EXAM: CT HEAD WITHOUT CONTRAST  TECHNIQUE: Contiguous axial images were obtained from the base of the skull through the vertex without intravenous contrast.  COMPARISON:  MRI 11/23/2013. Multiple previous examinations as far back as 2012.  FINDINGS: There is old infarction at the inferior cerebellum on the right. There are extensive chronic ischemic changes throughout the hemispheric white matter bilaterally. No sign of acute infarction, mass lesion, hemorrhage, hydrocephalus or extra-axial collection.  There is chronic opacification of the left maxillary sinus. No acute calvarial lesion.  IMPRESSION: No acute finding by CT. Old  right cerebellar infarction. Extensive chronic ischemic changes throughout the cerebral hemispheric white matter.   Electronically Signed   By: Nelson Chimes M.D.   On: 12/02/2013 15:38   Assessment/Plan: 63 y.o. male, right handed, with a past medical history significant for HTN, recent R ACA stroke and reported seizures at home as well as a witnessed generalized seizure today that apparently was not associated with post ictal state. Unsure about the semiology of this seizure but had a recent stroke that could predispose him to seizures. Needs EEG.  Low dose keppra. Will follow up.   Dorian Pod, MD 12/02/2013, 7:09 PM

## 2013-12-02 NOTE — ED Notes (Addendum)
Seizure was witnessed by CT tech and transporter after pt had CT head exam; it was stated that it lasted approximately 3 minutes; CT tech thought pt was moving because he was restless but when she and the transporter got in the room the pt was having a tonic-clonic-like seizure with full body stiffness and rigidity.  Pt then stopped shaking momentarily and twitched to the left side for a moment then flailed right hand over head until seizure-like movements resided.

## 2013-12-02 NOTE — ED Notes (Signed)
[facility] presents with a 63 yo male from home with seizure-like activity.  Pt family members stated that pt wasn't feeling good today/tired laid down and began seizing for approximately 3 minutes.  Upon [facility] arrival pt was not post-ictal and responding to questions appropriately.  Pt has had previous stroke 1 year ago and several reported TIAs between that stroke and now.  Pt has left sided deficits from previous stroke and dysphasia.  A&O x4.

## 2013-12-02 NOTE — H&P (Signed)
Date: 12/02/2013               Patient Name:  [name] MRN: [id number]  DOB: 06-19-1951 Age / Sex: 63 y.o., male   PCP: Terry Sox, MD         Medical Service: Internal Medicine Teaching Service         Attending Physician: Dr. Madilyn Fireman, MD    First Contact: Dr. Mechele Pittman Pager: [phone]  Second Contact: Dr. 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  Upon talking to the patient, he says his roommate called the ambulance due to the shaking.  He also reports similar prior episode last week but did not remember to mention it to Dr. Aundra Pittman yesterday during his hospital follow up appointment.  He then subsequently had another witnessed tonic-clonic event by CT staff in the hospital after his head CT was completed on admission.  Both episodes seem to have a very brief and spontaneously resolved Pittman-ictal state.  At the time of our interview he was alert awake and oriented to person, place, and time.  He says he kind of remembers the shaking during CT but does not remember the episode at this house where he lives with his roommate.  He denies LOC, loss of bowel or bladder, and no tongue biting.   He has not been drinking a lot of water lately and has decreased po intake with soft food only.  He drank beef broth today.  No nausea or vomiting. No bleeding in urine or stool.  No chest pain or shortness of breath.      Of note, his caretaker is his cousin [name].  He says he needs PT and has not followed up with neurology  since hospital discharge but has an appointment on Monday per documentation.    Meds: Current Facility-Administered Medications  Medication Dose Route Frequency Provider Last Rate Last Dose  . 0.9 % NaCl with KCl 20 mEq/ L  infusion   Intravenous Continuous Terry Reining, DO      . albuterol (PROVENTIL) (2.5 MG/3ML) 0.083% nebulizer solution 2.5 mg  2.5 mg Nebulization Q6H PRN Terry Fireman, MD      . atorvastatin (LIPITOR) tablet 80 mg  80 mg Oral Daily Terry Reining, DO      . [START ON 12/03/2013] clopidogrel (PLAVIX) tablet 75 mg  75 mg Oral Q breakfast Terry Reining, DO      . heparin injection 5,000 Units  5,000 Units Subcutaneous 3 times per day Terry Reining, DO      . LORazepam (ATIVAN) injection 1 mg  1 mg Intravenous PRN Terry Reining, DO      . multivitamin with minerals tablet 1 tablet  1 tablet Oral Daily Terry Reining, DO      . sodium chloride 0.9 % injection 3 mL  3 mL Intravenous Q12H Terry Reining, DO      . tiotropium [facility]) inhalation capsule 18 mcg  18 mcg Inhalation Daily Terry Reining, DO  Allergies: Allergies as of 12/02/2013 - Review Complete 12/02/2013  Allergen Reaction Noted  . Aspirin Hives 10/18/2012  . Penicillins Hives 10/18/2012   Past Medical History  Diagnosis Date  . Hypertension 05/18/2007  . COPD 05/18/2007  . Elevated PSA 05/25/2007  . 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 Stroke 12/05/2012  . Prostate cancer    Past Surgical History  Procedure Laterality Date  . 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 Multiple extractions with alveoloplasty N/A 10/23/2013    Procedure: Extraction of tooth #'s 1,2,3,4,5,6,7,11,12,13,14,15,16,20,21,22,23,24,27,28,29,32 with alveoloplasty and bilateral maxillary tuberosity reductions;  Surgeon: [name], DDS;  Location: [facility];  Service: Oral Surgery;  Laterality: N/A;  . Leg surgery      pin  right leg   Family History  Problem Relation Age of Onset  . Liver disease Mother   . Heart  attack Mother 26  . Colon cancer Neg Hx    History   Social History  . Marital Status: Single    Spouse Name: N/A    Number of Children: 0  . Years of Education: N/A   Occupational History  . Flooring instalation     Retired   Social History Main Topics  . Smoking status: Current Some Day Smoker -- 0.40 packs/day for 50 years    Types: Cigarettes  . Smokeless tobacco: Never Used     Comment: 1/2 ppd hx, now 3-4 cigs/day  . Alcohol Use: No     Comment: 1 beer a week, used to drink 2-3 beers a day  . Drug Use: No     Comment: history of cocaine use  . Sexual Activity: Not Currently   Other Topics Concern  . Not on file   Social History Narrative   Drinks 1/2 pt of wine daily and beer daily (at least 12 oz)   Denies recreational drugs    Smokes 3-4 cigarettes. 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    2 years of college at [employer] per patient          Review of Systems:  Constitutional:  Decreased appetite. Denies fever, chills   HEENT:  Denies congestion  Respiratory:  Denies SOB  Cardiovascular:  Denies palpitations and leg swelling.   Gastrointestinal:  Denies nausea, vomiting, abdominal pain  Genitourinary:  Denies hematuria, flank pain  Musculoskeletal:  Denies back pain.  Weakness on left extremities s/p stroke  Skin:  Dry.  Denies pallor, rash and wound.   Neurological:  Seizures, left sided weakness.  Denies headaches.    Physical Exam: Blood pressure 107/60, pulse 85, temperature 97.9 F (36.6 C), temperature source Oral, resp. rate 16, SpO2 98.00%. Vitals reviewed. General: resting in bed, NAD HEENT: EOMI, conjunctival injection b/l, slighter horizontal nystagmus more notable on right eye Cardiac: RRR Pulm: clear to auscultation bilaterally but poor effort Abd: soft, nontender, BS present Ext: warm and well perfused, thin, no pedal edema, +2dp b/l Neuro: alert and oriented X3, nystagmus on horizontal  gaze otherwise cranial nerves II-XII grossly intact, strength 4/5 R extremities vs 1-2/5 LLL. Unable to lift left leg.   Skin: Dry  Lab results: Basic Metabolic Panel:  Recent Labs  12/01/13 1137 12/02/13 1514  NA 140 139  K 3.6 3.1*  CL 103 102  CO2 26 23  GLUCOSE 102* 109*  BUN 21 32*  CREATININE 1.42* 1.99*  CALCIUM 10.2 10.7*  CBC:  Recent Labs  12/02/13 1514  WBC 7.0  HGB 8.6*  HCT 25.3*  MCV 84.3  PLT 282   Urine Drug Screen: Drugs of Abuse     Component Value Date/Time   LABOPIA NONE DETECTED 11/24/2013 0307   LABOPIA NEGATIVE 10/14/2010 2140   COCAINSCRNUR NONE DETECTED 11/24/2013 0307   COCAINSCRNUR  Value: POSITIVE (NOTE) Result repeated and verified. Sent for confirmatory testing* 10/14/2010 2140   LABBENZ NONE DETECTED 11/24/2013 0307   LABBENZ NEGATIVE 10/14/2010 2140   AMPHETMU NONE DETECTED 11/24/2013 0307   AMPHETMU NEGATIVE 10/14/2010 2140   THCU NONE DETECTED 11/24/2013 0307   LABBARB NONE DETECTED 11/24/2013 0307    Imaging results:  Ct Head Wo Contrast  12/02/2013   CLINICAL DATA:  Seizure like activity.  EXAM: CT HEAD WITHOUT CONTRAST  TECHNIQUE: Contiguous axial images were obtained from the base of the skull through the vertex without intravenous contrast.  COMPARISON:  MRI 11/23/2013. Multiple previous examinations as far back as 2012.  FINDINGS: There is old infarction at the inferior cerebellum on the right. There are extensive chronic ischemic changes throughout the hemispheric white matter bilaterally. No sign of acute infarction, mass lesion, hemorrhage, hydrocephalus or extra-axial collection. There is chronic opacification of the left maxillary sinus. No acute calvarial lesion.  IMPRESSION: No acute finding by CT. Old right cerebellar infarction. Extensive chronic ischemic changes throughout the cerebral hemispheric white matter.   Electronically Signed   By: Nelson Chimes M.D.   On: 12/02/2013 15:38   Other results: EKG: 83bpm, sinus rhythm,  ?IVCD  Assessment & Plan by Problem: Principal Problem:   Seizures Active Problems:   Hypertension   COPD   History of stroke   Smokes tobacco daily   Left-sided weakness, residual s/p cva   Hypokalemia  Mr. Rami is a 63 year old gentleman with PMH of HTN, prior CVA (R ACA stroke 10/2013), COPD, and chronic tobacco use admitted for new onset seizures.    New onset seizures in setting of recent CVA--2 witnessed events day of admission and possible one more shaking episode at home last week per patient.  Very brief spontaneously resolving Pittman-ictal period per staff and EMS.  Last EEG of 10/2013 with mild generalized irregular delta, cannot exclude encephalopathy.  No seizure or seizure predisposition.  ED spoke to neurology who recommended starting keppra at this time with continued neurology follow up. CT head with no acute finding, old R cerebellar infarction with extensive chronic ischemic changes.  MRI 10/2013: Acute to subacute R ACA infarct, no hemorrhage or mass effect. Chronic R cerebellar infarct.  -admit to tele, video monitored bed -q2 neuro checks -seizure and fall precautions -ativan 1mg  prn seizure -appreciate neurology following -keppra 500mg  po bid -PT -U/A -CK -Continue home Plavix, statin -Consider EEG -HIV  Acute on ?chronic renal failure--GFR noted to be >90 on most visits of 2014 but as low as 39 on admission today.  Baseline Cr ~1. Today 1.99.  Possibly pre-renal with rise in BUN to 32 today and decreased po intake along with HCTZ as home medication, however ratio of BUN:Cr is <20 thus making possible intrinsic etiology as well.  He is on an ACE (lisinopril) at home.   -given NS 500cc bolus in ED and will hydrate with IVF and K for 12 more hours and trend Cr -if no improvement, will get renal ultrasound -hold diuretic and ACE for now -AM BMET  Hypercalcemia--Ca 10.7 on admission, corrected to 11.4 with albumin of 3.1.  Likely secondary to po supplementation of  calcium at home.  Additionally, patient claims to have prostate cancer thus could be secondary to malignancy vs. Hyperparathyroidism.    -hold home Ca supplement -trend Ca, consider checking ionized -if no improvement, will check PTH and consider PTHrp if malignancy does exist  Chronic tobacco dependence in setting of COPD--hx of 1/2ppd but now says maybe 2-3 cigs per day.  No PFTs on chart. o2 sats were noted to be 86-88% on room air but not great waveform on monitor, improved o2 sats with pulse ox corrected.  -supplemental oxygen 2L [place]  -cessation strongly advised -also has hx of cocaine use and does drink beer daily.   -UDS -Continue Spiriva and albuterol   Acute on chronic anemia--Hb down to 8.6 today, was 9.9 on last hospital discharge 4/16.  Denies any recent bleeding in stool or urine.  MCV 84.3.  -trend CBC -consider iron panel -U/A -AM EKG  Hypokalemia--K 3.1 on admission.   -Replaced with oral kdur and add K to IVF for now -AM BMET -Check Mag  Diet: Soft DVT Ppx: Heparin Dispo: Disposition is deferred at this time, awaiting improvement of current medical problems. Anticipated discharge in approximately 1-2 day(s).   The patient does have a current PCP Terry Sox, MD) and does need an [facility] hospital follow-up appointment after discharge.  The patient does not have transportation limitations that hinder transportation to clinic appointments.  Signed: Jerene Pitch, MD 12/02/2013, 6:44 PM

## 2013-12-02 NOTE — ED Provider Notes (Addendum)
CSN: [id number]     Arrival date & time 12/02/13  1442 History   First MD Initiated Contact with Patient 12/02/13 1442     Chief Complaint  Patient presents with  . Seizures     (Consider location/radiation/quality/duration/timing/severity/associated sxs/prior Treatment) HPI Comments: Pt states that he was tired this morning and then he had full body shaking. Family states that the episode lasted for about 3 minutes. Pt was not responding to the family during the episode, but pt states that he does remember shaking. Pt states that he doesn't know of any history of seizure, but family reported to ems that he has had seizures since his stroke but he is not on any medications for it. Denies cp, sob, n/v/d. ems state that pt was not postictal  on there arrival  The history is provided by the patient and the EMS personnel. No language interpreter was used.    Past Medical History  Diagnosis Date  . Hypertension 05/18/2007  . COPD 05/18/2007  . Elevated PSA 05/25/2007  . 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 Stroke 12/05/2012  . Prostate cancer    Past Surgical History  Procedure Laterality Date  . 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 Multiple extractions with alveoloplasty N/A 10/23/2013    Procedure: Extraction of tooth #'s 1,2,3,4,5,6,7,11,12,13,14,15,16,20,21,22,23,24,27,28,29,32 with alveoloplasty and bilateral maxillary tuberosity reductions;  Surgeon: Lenn Cal, DDS;  Location: [facility];  Service: Oral Surgery;  Laterality: N/A;  . Leg surgery      pin  right leg   Family History  Problem Relation Age of Onset  . Liver disease Mother   . Heart attack Mother 67  . Colon cancer Neg Hx    History  Substance Use Topics  . Smoking status: Current Some Day Smoker -- 0.40 packs/day for 50 years    Types: Cigarettes  . Smokeless tobacco: Never Used     Comment: 1/2 ppd, tobacco info given 10/16/13  . Alcohol Use: No     Comment: 2-3 beers per day    Review of  Systems    Allergies  Aspirin and Penicillins  Home Medications   Prior to Admission medications   Medication Sig Start Date End Date Taking? Authorizing Provider  albuterol (PROVENTIL HFA;VENTOLIN HFA) 108 (90 BASE) MCG/ACT inhaler Inhale 2 puffs into the lungs every 6 (six) hours as needed for wheezing. 10/10/13   Corky Sox, MD  atorvastatin (LIPITOR) 80 MG tablet Take 1 tablet (80 mg total) by mouth daily at 6 PM. 10/10/13   Corky Sox, MD  CALCIUM PO Take 1 tablet by mouth daily.    Historical Provider, MD  chlorhexidine (PERIDEX) 0.12 % solution Rinse with 15 mls twice daily for 30 seconds. Use after breakfast and at bedtime. Spit out excess. Do not swallow. 11/07/13   Lenn Cal, DDS  clopidogrel (PLAVIX) 75 MG tablet Take 1 tablet (75 mg total) by mouth daily with breakfast. 12/01/13   Cresenciano Genre, MD  hydrochlorothiazide (HYDRODIURIL) 25 MG tablet Take 1 tablet (25 mg total) by mouth daily. 11/09/13   Cresenciano Genre, MD  lisinopril (PRINIVIL,ZESTRIL) 10 MG tablet Take 1 tablet (10 mg total) by mouth daily. 12/01/13   Cresenciano Genre, MD  Multiple Vitamin (MULTIVITAMIN WITH MINERALS) TABS tablet Take 1 tablet by mouth daily.    Historical Provider, MD  tiotropium (SPIRIVA HANDIHALER) 18 MCG inhalation capsule Place 1 capsule (18 mcg total) into inhaler and  inhale daily. 10/10/13 10/10/14  Corky Sox, MD   BP 98/56  Pulse 84  Temp(Src) 97.6 F (36.4 C) (Oral)  Resp 18  SpO2 100% Physical Exam  Nursing note and vitals reviewed. Constitutional: He is oriented to person, place, and time.  Cardiovascular: Normal rate and regular rhythm.   Pulmonary/Chest: Effort normal and breath sounds normal.  Abdominal: Soft. Bowel sounds are normal. There is no tenderness.  Neurological: He is alert and oriented to person, place, and time.  Skin: Skin is warm and dry.    ED Course  Procedures (including critical care time) Labs Review Labs Reviewed  BASIC METABOLIC PANEL - Abnormal;  Notable for the following:    Potassium 3.1 (*)    Glucose, Bld 109 (*)    BUN 32 (*)    Creatinine, Ser 1.99 (*)    Calcium 10.7 (*)    GFR calc non Af Amer 34 (*)    GFR calc Af Amer 39 (*)    All other components within normal limits  CBC - Abnormal; Notable for the following:    RBC 3.00 (*)    Hemoglobin 8.6 (*)    HCT 25.3 (*)    All other components within normal limits  URINE RAPID DRUG SCREEN (HOSP PERFORMED)    Imaging Review Ct Head Wo Contrast  12/02/2013   CLINICAL DATA:  Seizure like activity.  EXAM: CT HEAD WITHOUT CONTRAST  TECHNIQUE: Contiguous axial images were obtained from the base of the skull through the vertex without intravenous contrast.  COMPARISON:  MRI 11/23/2013. Multiple previous examinations as far back as 2012.  FINDINGS: There is old infarction at the inferior cerebellum on the right. There are extensive chronic ischemic changes throughout the hemispheric white matter bilaterally. No sign of acute infarction, mass lesion, hemorrhage, hydrocephalus or extra-axial collection. There is chronic opacification of the left maxillary sinus. No acute calvarial lesion.  IMPRESSION: No acute finding by CT. Old right cerebellar infarction. Extensive chronic ischemic changes throughout the cerebral hemispheric white matter.   Electronically Signed   By: Nelson Chimes M.D.   On: 12/02/2013 15:38     EKG Interpretation None      MDM   Final diagnoses:  Seizure-like activity  Renal insufficiency    Pt went to ct and they said that pt had 3 minute episode of full body shaking. When pt came back to the er there was no postictal phase. Spoke with Dr. Aram Beecham with neurology and he stated to start the pt on keppra 500mg  bid and that Dr. Leonie Man could follow up with the pt as he is supposed to see him sometime this week: Pt to be admitted by internal medicine service for further evaluation   Glendell Docker, NP 12/02/13 [place], NP 12/02/13  [place], NP 12/25/13 [phone]

## 2013-12-02 NOTE — ED Notes (Signed)
Attempted to call report

## 2013-12-03 DIAGNOSIS — N189 Chronic kidney disease, unspecified: Secondary | ICD-10-CM

## 2013-12-03 LAB — CBC
HCT: 22.3 % — ABNORMAL LOW (ref 39.0–52.0)
HEMATOCRIT: 23.6 % — AB (ref 39.0–52.0)
HEMOGLOBIN: 7.7 g/dL — AB (ref 13.0–17.0)
Hemoglobin: 7.3 g/dL — ABNORMAL LOW (ref 13.0–17.0)
MCH: 27.8 pg (ref 26.0–34.0)
MCH: 27.7 pg (ref 26.0–34.0)
MCHC: 32.6 g/dL (ref 30.0–36.0)
MCHC: 32.7 g/dL (ref 30.0–36.0)
MCV: 85.2 fL (ref 78.0–100.0)
MCV: 84.5 fL (ref 78.0–100.0)
Platelets: 255 10*3/uL (ref 150–400)
Platelets: 260 10*3/uL (ref 150–400)
RBC: 2.77 MIL/uL — AB (ref 4.22–5.81)
RBC: 2.64 MIL/uL — ABNORMAL LOW (ref 4.22–5.81)
RDW: 14.2 % (ref 11.5–15.5)
RDW: 14.3 % (ref 11.5–15.5)
WBC: 7.1 10*3/uL (ref 4.0–10.5)
WBC: 6.5 10*3/uL (ref 4.0–10.5)

## 2013-12-03 LAB — BASIC METABOLIC PANEL
BUN: 31 mg/dL — ABNORMAL HIGH (ref 6–23)
CHLORIDE: 108 meq/L (ref 96–112)
CO2: 23 mEq/L (ref 19–32)
Calcium: 9.8 mg/dL (ref 8.4–10.5)
Creatinine, Ser: 1.53 mg/dL — ABNORMAL HIGH (ref 0.50–1.35)
GFR calc Af Amer: 54 mL/min — ABNORMAL LOW (ref >90)
GFR calc non Af Amer: 46 mL/min — ABNORMAL LOW (ref >90)
GLUCOSE: 88 mg/dL (ref 70–99)
POTASSIUM: 3.9 meq/L (ref 3.7–5.3)
Sodium: 141 mEq/L (ref 137–147)

## 2013-12-03 LAB — UREA NITROGEN, URINE: Urea Nitrogen, Ur: 577 mg/dL

## 2013-12-03 LAB — CALCIUM, IONIZED: Calcium, Ion: 1.34 mmol/L — ABNORMAL HIGH (ref 1.13–1.30)

## 2013-12-03 LAB — HIV ANTIBODY (ROUTINE TESTING W REFLEX): HIV 1&2 Ab, 4th Generation: NONREACTIVE

## 2013-12-03 MED ORDER — LEVETIRACETAM 500 MG PO TABS
500.0000 mg | ORAL_TABLET | Freq: Two times a day (BID) | ORAL | Status: DC
  Administered 2013-12-03 – 2013-12-05 (×5): 500 mg via ORAL
  Filled 2013-12-03 (×6): qty 1

## 2013-12-03 MED ORDER — MAGNESIUM SULFATE 40 MG/ML IJ SOLN
2.0000 g | Freq: Once | INTRAMUSCULAR | Status: AC
  Administered 2013-12-03: 2 g via INTRAVENOUS
  Filled 2013-12-03: qty 50

## 2013-12-03 NOTE — ED Provider Notes (Signed)
  This was a shared visit with a mid-level provided (NP or PA).  Throughout the patient's course I was available for consultation/collaboration.  I saw the relevant labs and studies - I agree with the interpretation.  On my exam the patient was in no distress.   He had recovered from a post-ictal phase and was speaking slowly, but appropriately.  He has a notable Hx of CVA, and endorses a Hx of seizures, but there is no documentation of this diagnosis.  He had another seizure-like episode in radiology, prompting discussion with neurology and admission for further E/M.       Carmin Muskrat, MD 12/03/13 1259

## 2013-12-03 NOTE — H&P (Signed)
INTERNAL MEDICINE TEACHING ATTENDING NOTE  Day 1 of stay  Patient name: [name]  MRN: [id number] Date of birth: 01-Apr-1951   63 y.o. man with recent stroke, coming with witnessed generalized clonic tonic seizures at home and in hospital. He has been started on low dose keppra, and has been seen by neurology staff. He was not post ictal. He has not had any seizures overnight. I met him in the morning and he appeared his usual self (I have admitted him numerous times before as well). He denies any pain, and complains of his baseline left sided weakness from a prior stroke. He denies any change in vision.   His vitals are stable.   General: Sitting in a chair, working with PT. HEENT: PERRL, EOMI, no scleral icterus. No teeth. Heart: RRR, no rubs, murmurs or gallops. Lungs: Clear to auscultation bilaterally, no wheezes, rales, or rhonchi. Abdomen: Soft, nontender, nondistended, BS present. Extremities: Warm, no pedal edema. Neuro: Alert and oriented X3, cranial nerves II-XII grossly intact,   sensation to light touch equal in bilateral upper and lower extremities. Strength - 4/5 LLE and 4/5 LUE.   I have reviewed the chart, labs, imaging and note by Dr Eula Fried. At this time, we will continue low dose Keppra and get an EEG tomorrow. He will work with PT/OT. Appreciate neurology recommendations.  I have seen and evaluated this patient and discussed it with my IM resident team.  Please see the rest of the plan per resident note from today.   Mikaelah Trostle 12/03/2013, 12:59 PM.

## 2013-12-03 NOTE — Progress Notes (Signed)
One run of 2nd degree type 1 heart block noted. Pt asymptomatic. Pt now sinus rhythm. On call notified. Will continue to monitor. Verdie Drown RN BSN

## 2013-12-03 NOTE — Evaluation (Signed)
Physical Therapy Evaluation Patient Details Name: [name] MRN: [id number] DOB: 1951-02-05 Today's Date: 12/03/2013   History of Present Illness  63 y.o. male admitted to [facility] on 12/02/13 with wittnessed shaking episodes.  Pt being worked up for possible seizure.  Of note, he was recently d/c from [facility] on 11/24/13 and this admission is his 4th admission this year.  Pt with significant PMhx of HTN, COPD, stroke (with some mild left sided weakness), COPD, right leg pinning, and right toe amputation.    Clinical Impression  Pt may be close to his baseline (which is poor) level of mobility.  This is his fourth admission this year.  He is at high risk of falls due to both physical and cognitive deficits.  He reports he wants to go live with his cousin at d/c who can provide more assist than he has now (he has a roommate). He would benefit from OP PT for balance and gait training.   PT to follow acutely for deficits listed below.       Follow Up Recommendations Outpatient PT;Supervision/Assistance - 24 hour (Pt has medicaid and will likely not qualify for HHPT)    Equipment Recommendations  None recommended by PT    Recommendations for Other Services   NA    Precautions / Restrictions Precautions Precautions: Fall Precaution Comments: left leg weakness      Mobility  Bed Mobility Overal bed mobility: Modified Independent             General bed mobility comments: uses bed rail for leverage to get to EOB.   Transfers Overall transfer level: Needs assistance Equipment used: Rolling walker (2 wheeled) Transfers: Sit to/from Stand Sit to Stand: Min assist         General transfer comment: Min assist to support trunk to get to standing.   Ambulation/Gait Ambulation/Gait assistance: Min assist Ambulation Distance (Feet): 50 Feet Assistive device: Rolling walker (2 wheeled) Gait Pattern/deviations: Decreased step length - left;Decreased dorsiflexion - left;Shuffle Gait  velocity: decreased Gait velocity interpretation: Below normal speed for age/gender General Gait Details: Pt able to use RW for support, but needed assist at trunk for balance as he often is dragging and unaware leaving his left foot behind him while walking or turning with RW.  He is at risk for falls.          Balance Overall balance assessment: Needs assistance Sitting-balance support: Feet supported;No upper extremity supported Sitting balance-Leahy Scale: Good     Standing balance support: Bilateral upper extremity supported Standing balance-Leahy Scale: Poor                               Pertinent Vitals/Pain See vitals flow sheet.     Home Living Family/patient expects to be discharged to:: Private residence Living Arrangements: Non-relatives/Friends Available Help at Discharge: Family (cousin- pt thinking about moving in with her) Type of Home:  (unsure of cousin's home layout)         Home Equipment: Environmental consultant - 2 wheels;Cane - single point;Shower seat;Wheelchair - manual Additional Comments: Pt reports he wants to go stay with his cousin as he is not doing well at home (basically alone, his roommate does not help him out any).  Cousin not in room to confirm that he can do this.    Prior Function Level of Independence: Needs assistance   Gait / Transfers Assistance Needed: Uses RW for gait, rarely uses WC  ADL's / Homemaking Assistance Needed: I did not assess ADLS        Hand Dominance   Dominant Hand: Right    Extremity/Trunk Assessment   Upper Extremity Assessment: LUE deficits/detail       LUE Deficits / Details: left arm grip strength weaker than right arm grip strength.  Functionally, he cannot lift either arm higher than 90 degrees and cannot fully resist me against gravity.  I would generally rate his left arm within available ROM 3+/5 throughout and right arm 4-5 out of 5 in available ROM.    Lower Extremity Assessment: LLE  deficits/detail   LLE Deficits / Details: left leg weaker when compared to right in sitting.  Left knee extension and hip flexion 3+ to 4/5.  With a longer hold he seems to become stronger.  Some motor apraxia (slowness to respond to my cues could be both motor and cognitive).   Cervical / Trunk Assessment: Normal  Communication   Communication: Expressive difficulties;Other (comment) (no teeth)  Cognition Arousal/Alertness: Awake/alert Behavior During Therapy: WFL for tasks assessed/performed Overall Cognitive Status: Impaired/Different from baseline Area of Impairment: Attention;Following commands;Problem solving   Current Attention Level: Focused   Following Commands: Follows one step commands with increased time     Problem Solving: Slow processing;Requires verbal cues;Requires tactile cues General Comments: Pt very easily distracted and has to be redirected frequently while preforming HEP with therapist.        Exercises General Exercises - Lower Extremity Long Arc Quad: AROM;Both;10 reps;Seated Hip ABduction/ADduction: AROM;Both;10 reps;Seated (adduct only against pillow for resistance) Hip Flexion/Marching: AROM;Both;10 reps;Seated Toe Raises: AROM;Both;10 reps;Seated Heel Raises: AROM;Both;10 reps;Seated      Assessment/Plan    PT Assessment    PT Diagnosis Difficulty walking;Abnormality of gait;Generalized weakness;Hemiplegia non-dominant side   PT Problem List Decreased strength;Decreased activity tolerance;Decreased balance;Decreased mobility;Decreased coordination;Decreased cognition;Decreased knowledge of use of DME;Decreased safety awareness;Decreased knowledge of precautions  PT Treatment Interventions DME instruction;Gait training;Stair training;Functional mobility training;Therapeutic activities;Therapeutic exercise;Balance training;Neuromuscular re-education;Cognitive remediation;Patient/family education;Wheelchair mobility training   PT Goals (Current goals  can be found in the Care Plan section) Acute Rehab PT Goals Patient Stated Goal: to go live with his cousin PT Goal Formulation: With patient Time For Goal Achievement: 12/17/13 Potential to Achieve Goals: Good    Frequency Min 3X/week   Barriers to discharge Decreased caregiver support         End of Session Equipment Utilized During Treatment: Gait belt Activity Tolerance: Patient tolerated treatment well Patient left: in chair;with call bell/phone within reach;with chair alarm set Nurse Communication: Mobility status         Time: 1610-9604 PT Time Calculation (min): 35 min   Charges:    1EV, 1 Gait, 1 TE          Chelcea Zahn B. [place], [place], DPT [phone]   12/03/2013, 1:23 PM

## 2013-12-03 NOTE — Progress Notes (Signed)
Subjective: No overnight events. No further seizures. Pt states that he feels fine today. He denies any headaches, vision changes, chest pain, SOB, N/V, or abdominal pain.   Objective: Vital signs in last 24 hours: Filed Vitals:   12/02/13 1800 12/02/13 2118 12/03/13 0541 12/03/13 0900  BP:  111/69 144/76   Pulse:  77 73 74  Temp: 97.9 F (36.6 C) 97.4 F (36.3 C) 97.9 F (36.6 C)   TempSrc: Oral Oral Oral   Resp:  18 18   SpO2:  100% 100% 98%   Weight change:  No intake or output data in the 24 hours ending 12/03/13 1234 Vitals reviewed. General: Sitting up in a chair, NAD HEENT: PERRL, EOMI, no scleral icterus Cardiac: RRR, no rubs, murmurs or gallops Pulm: clear to auscultation bilaterally, no wheezes, rales, or rhonchi Abd: soft, nontender, nondistended Ext: warm and well perfused, no pedal edema Neuro: alert and oriented X3, cranial nerves II-XII grossly intact, non focal neurological deficits, moves all 4 ext spontaneously  Lab Results: Basic Metabolic Panel:  Recent Labs Lab 12/02/13 1514 12/02/13 1805 12/03/13 0538  NA 139  --  141  K 3.1*  --  3.9  CL 102  --  108  CO2 23  --  23  GLUCOSE 109*  --  88  BUN 32*  --  31*  CREATININE 1.99*  --  1.53*  CALCIUM 10.7*  --  9.8  MG  --  1.8  --    Liver Function Tests: No results found for this basename: AST, ALT, ALKPHOS, BILITOT, PROT, ALBUMIN,  in the last 168 hours No results found for this basename: LIPASE, AMYLASE,  in the last 168 hours No results found for this basename: AMMONIA,  in the last 168 hours CBC:  Recent Labs Lab 12/02/13 1514 12/03/13 0538  WBC 7.0 7.1  HGB 8.6* 7.7*  HCT 25.3* 23.6*  MCV 84.3 85.2  PLT 282 255   Cardiac Enzymes:  Recent Labs Lab 12/02/13 1805  CKTOTAL 187  TROPONINI <0.30   BNP: No results found for this basename: PROBNP,  in the last 168 hours D-Dimer: No results found for this basename: DDIMER,  in the last 168 hours CBG: No results found for this  basename: GLUCAP,  in the last 168 hours Hemoglobin A1C: No results found for this basename: HGBA1C,  in the last 168 hours Fasting Lipid Panel: No results found for this basename: CHOL, HDL, LDLCALC, TRIG, CHOLHDL, LDLDIRECT,  in the last 168 hours Thyroid Function Tests: No results found for this basename: TSH, T4TOTAL, FREET4, T3FREE, THYROIDAB,  in the last 168 hours Coagulation: No results found for this basename: LABPROT, INR,  in the last 168 hours Anemia Panel: No results found for this basename: VITAMINB12, FOLATE, FERRITIN, TIBC, IRON, RETICCTPCT,  in the last 168 hours Urine Drug Screen: Drugs of Abuse     Component Value Date/Time   LABOPIA NONE DETECTED 12/02/2013 1935   LABOPIA NEGATIVE 10/14/2010 2140   [place] 12/02/2013 1935   COCAINSCRNUR  Value: POSITIVE (NOTE) Result repeated and verified. Sent for confirmatory testing* 10/14/2010 2140   LABBENZ NONE DETECTED 12/02/2013 1935   LABBENZ NEGATIVE 10/14/2010 2140   AMPHETMU NONE DETECTED 12/02/2013 1935   AMPHETMU NEGATIVE 10/14/2010 2140   THCU NONE DETECTED 12/02/2013 1935   LABBARB NONE DETECTED 12/02/2013 1935    Alcohol Level: No results found for this basename: ETH,  in the last 168 hours Urinalysis:  Recent Labs Lab 12/02/13  [place] 1.023  PHURINE 5.0  GLUCOSEU NEGATIVE  HGBUR NEGATIVE  BILIRUBINUR SMALL*  KETONESUR 15*  PROTEINUR NEGATIVE  UROBILINOGEN 0.2  NITRITE NEGATIVE  LEUKOCYTESUR NEGATIVE     Micro Results: No results found for this or any previous visit (from the past 240 hour(s)). Studies/Results: Ct Head Wo Contrast  12/02/2013   CLINICAL DATA:  Seizure like activity.  EXAM: CT HEAD WITHOUT CONTRAST  TECHNIQUE: Contiguous axial images were obtained from the base of the skull through the vertex without intravenous contrast.  COMPARISON:  MRI 11/23/2013. Multiple previous examinations as far back as 2012.  FINDINGS: There is old infarction at the  inferior cerebellum on the right. There are extensive chronic ischemic changes throughout the hemispheric white matter bilaterally. No sign of acute infarction, mass lesion, hemorrhage, hydrocephalus or extra-axial collection. There is chronic opacification of the left maxillary sinus. No acute calvarial lesion.  IMPRESSION: No acute finding by CT. Old right cerebellar infarction. Extensive chronic ischemic changes throughout the cerebral hemispheric white matter.   Electronically Signed   By: Nelson Chimes M.D.   On: 12/02/2013 15:38   Medications: I have reviewed the patient's current medications. Scheduled Meds: . atorvastatin  80 mg Oral Daily  . clopidogrel  75 mg Oral Q breakfast  . heparin  5,000 Units Subcutaneous 3 times per day  . levETIRAcetam  500 mg Oral BID  . multivitamin with minerals  1 tablet Oral Daily  . sodium chloride  3 mL Intravenous Q12H  . tiotropium  18 mcg Inhalation Daily   Continuous Infusions:  PRN Meds:.albuterol, LORazepam  Assessment/Plan:  Seizures: New onset. Witnessed at home and by EMS. Last EEG of 10/2013 with mild generalized irregular delta, cannot exclude encephalopathy. Keppra started in the ED and [facility] was consulted. Continuing Keppra for now. EEG pending. No further seizures.  - Keppra 500mg  BID - F/u EEG - Seizure precautions - Neuro checks  Acute on chronic renal failure: GFR noted to be >90 on most visits of 2014 but as low as 39 on admission today. Baseline Cr ~1. 1.99 on admission. Likely pre-renal with rise in BUN to 32 today and decreased po intake along with HCTZ as home medication. Cr improved to 1.53 today after IVF. Holding ACEi and diuretic. -AM BMP   Hypercalcemia: Ca 10.7 on admission, corrected to 11.4 with albumin of 3.1. Likely secondary to po supplementation of calcium at home. Reported h/o prostate cancer thus could be secondary to malignancy vs. hyperparathyroidism.  Ca improved to 9.8 today. Ionized Ca pending. -holding home  Ca supplement  -trend Ca, consider checking ionized   Acute on chronic anemia: Hb down to 8.6 on admisison, was 9.9 on last hospital discharge 4/16. Denies any recent bleeding in stool or urine. MCV 84.3. Today Hgb 7.7. Checking CBC at 5pm. Tx for Hgb <7. -CBC q12h  Hypokalemia: Resolved. K 3.1 on admission. Supplemented and is 3.9. - AM BMP  DVT PPx: [place] Heparin   Dispo: Disposition is deferred at this time, awaiting improvement of current medical problems.  Anticipated discharge in approximately 1-2 day(s).   The patient does have a current PCP Corky Sox, MD) and does need an [facility] hospital follow-up appointment after discharge.  The patient does not have transportation limitations that hinder transportation to clinic appointments.  .Services Needed at time of discharge: Y = Yes, Blank = No PT:   OT:   RN:   Equipment:   Other:  LOS: 1 day   Otho Bellows, MD 12/03/2013, 12:34 PM

## 2013-12-03 NOTE — Discharge Summary (Signed)
Name: [name] MRN: [id number] DOB: 18-Aug-1950 63 y.o. PCP: Corky Sox, MD  Date of Admission: 12/02/2013  2:42 PM Date of Discharge: 12/05/2013 Attending Physician: Madilyn Fireman, MD  Discharge Diagnosis: Principal Problem:   Seizures- in setting of recent CVA Active Problems:   Hypertension   COPD   History of stroke   Smokes tobacco daily   Left-sided weakness, residual s/p cva   Hypokalemia- resolved    Acute renal failure- resolved    Protein-calorie malnutrition, severe  Discharge Medications:   Medication List    STOP taking these medications       CALCIUM PO      TAKE these medications       albuterol 108 (90 BASE) MCG/ACT inhaler  Commonly known as:  PROVENTIL HFA;VENTOLIN HFA  Inhale 2 puffs into the lungs every 6 (six) hours as needed for wheezing.     atorvastatin 80 MG tablet  Commonly known as:  LIPITOR  Take 80 mg by mouth daily.     chlorhexidine 0.12 % solution  Commonly known as:  PERIDEX  Use as directed 15 mLs in the mouth or throat 2 (two) times daily. Use after breakfast and at bedtime:  Rinse and spit out excess, do not swallow     clopidogrel 75 MG tablet  Commonly known as:  PLAVIX  Take 1 tablet (75 mg total) by mouth daily with breakfast.     feeding supplement (ENSURE COMPLETE) Liqd  Take 237 mLs by mouth 2 (two) times daily between meals.     hydrochlorothiazide 25 MG tablet  Commonly known as:  HYDRODIURIL  Take 1 tablet (25 mg total) by mouth daily.     levETIRAcetam 500 MG tablet  Commonly known as:  KEPPRA  Take 1 tablet (500 mg total) by mouth 2 (two) times daily.     lisinopril 10 MG tablet  Commonly known as:  PRINIVIL,ZESTRIL  Take 1 tablet (10 mg total) by mouth daily.     multivitamin with minerals Tabs tablet  Take 1 tablet by mouth daily.     tiotropium 18 MCG inhalation capsule  Commonly known as:  SPIRIVA HANDIHALER  Place 1 capsule (18 mcg total) into inhaler and inhale daily.         Disposition and follow-up:   [name] was discharged from [facility] in Stable condition.  At the hospital follow up visit please address:  1.  Hypercalcemia- Patient on calcium supplement at home and elevated Ca level on admission. We discontinued the calcium supplement during admission and at discharge; please assess w/ BMP 2.  Seizure- Patient w/ new onset (probable) seizures thought to be 2/2 recent prior stroke. He was discharged on keppra 500mg  BID, please assess compliance and for any further seizures. Pt needs appointment with neurology. I was unable to obtain appointment as for new patients they wanted patient/family to call to schedule. I spoke with patient's cousin [name]) who he is moving in with about this and she is aware that the appointment needs to be made.  2.  Labs / imaging needed at time of follow-up: BMP  3.  Pending labs/ test needing follow-up: none  Follow-up Appointments: Follow-up Information   Follow up with [facility]. Schedule an appointment as soon as possible for a visit in 1 week.   Contact information:   [address] [place] [address] [phone]      Follow up with Elnora Morrison, MD  On 12/14/2013. (9:00am)    Specialty:  Internal Medicine   Contact information:   [place] [address] [phone]       Discharge Instructions: Discharge Orders   Future Appointments Provider Department Dept Phone   12/14/2013 9:00 AM Hester Mates, MD [place] [phone]   01/10/2014 1:15 PM Corky Sox, MD [place] [phone]   Future Orders Complete By Expires   Call MD for:  difficulty breathing, headache or visual disturbances  As directed    Call MD for:  extreme fatigue  As directed    Call MD for:  persistant dizziness or light-headedness  As directed    Call MD for:  severe uncontrolled pain  As directed    Diet -  low sodium heart healthy  As directed    Increase activity slowly  As directed       Consultations: Treatment Team:  Catarina Hartshorn, MD (Dr. Armida Sans)  Procedures Performed:  Ct Head Wo Contrast  12/02/2013   CLINICAL DATA:  Seizure like activity.  EXAM: CT HEAD WITHOUT CONTRAST  TECHNIQUE: Contiguous axial images were obtained from the base of the skull through the vertex without intravenous contrast.  COMPARISON:  MRI 11/23/2013. Multiple previous examinations as far back as 2012.  FINDINGS: There is old infarction at the inferior cerebellum on the right. There are extensive chronic ischemic changes throughout the hemispheric white matter bilaterally. No sign of acute infarction, mass lesion, hemorrhage, hydrocephalus or extra-axial collection. There is chronic opacification of the left maxillary sinus. No acute calvarial lesion.  IMPRESSION: No acute finding by CT. Old right cerebellar infarction. Extensive chronic ischemic changes throughout the cerebral hemispheric white matter.   Electronically Signed   By: Nelson Chimes M.D.   On: 12/02/2013 15:38   Ct Head Wo Contrast  11/23/2013   CLINICAL DATA:  Code stroke.  Left-sided weakness.  EXAM: CT HEAD WITHOUT CONTRAST  TECHNIQUE: Contiguous axial images were obtained from the base of the skull through the vertex without contrast.  COMPARISON:  CT head 10/31/2013.  MR head 11/01/2013.  FINDINGS: No evidence for acute infarction, hemorrhage, mass lesion, hydrocephalus, or extra-axial fluid. Premature for age cerebral and cerebellar atrophy. Chronic right cerebellar infarct. Chronic left parietal infarct. Chronic medial right frontal infarct. No definite acute cerebral ischemia. Chronic microvascular ischemic change. Calvarium intact. Vascular calcification. No CT signs of proximal vascular thrombosis.  IMPRESSION: Chronic changes as described. There is little apparent progression of disease when compared with prior CT or MR.  Critical Value/emergent  results were called by telephone at the time of interpretation on 11/23/2013 at 2:59 PM to Dr. Stroke neurologist, who verbally acknowledged these results.   Electronically Signed   By: Rolla Flatten M.D.   On: 11/23/2013 15:00   [name] Head Wo Contrast  11/23/2013   CLINICAL DATA:  Stroke  EXAM: MRA HEAD WITHOUT CONTRAST  TECHNIQUE: Angiographic images of the Circle of Willis were obtained using MRA technique without intravenous contrast.  COMPARISON:  Prior MRI performed earlier on the same day as well as previous MRA from 12/05/2012.  FINDINGS: Anterior circulation: Normal flow related enhancement of the included cervical, petrous, cavernous and supra clinoid internal carotid arteries. Multi focal atherosclerotic irregularity seen within the cavernous segments of the internal carotid arteries bilaterally, left greater than right. Stenoses within the cavernous segment of the left internal carotid measure up to 60-70% at its most narrow point (series 3, image  63). Overall, these findings are not significantly changed relative to prior MRA. Patent anterior communicating artery. Patent flow seen within the anterior cerebral and middle cerebral arteries bilaterally. Multi focal irregularity within the M1 and A2 segments bilaterally are grossly stable relative to prior study, likely related to underlying atheromatous disease.  Posterior circulation: Left vertebral artery is dominant and widely patent to the level of the vertebrobasilar junction. There is multi focal irregularity with occlusion of the distal right vertebral artery proximal to the vertebrobasilar junction, unchanged. Posterior inferior cerebellar arteries are within normal limits. Basilar artery is patent, with normal flow related enhancement of the main branch vessels. Normal flow related enhancement of the posterior cerebral arteries.  No large vessel occlusion, hemodynamically significant stenosis, abnormal luminal irregularity, aneurysm within the  anterior nor posterior circulation.  IMPRESSION: 1. Multi focal irregularity within the cavernous segments of the internal carotid arteries bilaterally, left greater than right. 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 Overall, these findings are not significantly changed relative to prior MRA from 12/05/2012. 2. Occlusion of the distal right vertebral artery, unchanged. The left vertebral artery is dominant and widely patent to the level of the vertebrobasilar junction. 3. Scattered multi focal atherosclerotic irregularity within the anterior and middle cerebral artery branches bilaterally, left greater than right. Findings are grossly stable relative to prior MRA from 12/05/2012. 4. No intracranial aneurysm   Electronically Signed   By: Jeannine Boga M.D.   On: 11/23/2013 22:46   [name]  11/23/2013   CLINICAL DATA:  Recent right anterior cerebral artery stroke. Worsening left-sided weakness.  EXAM: MRI HEAD WITHOUT CONTRAST  TECHNIQUE: Multiplanar, multiecho pulse sequences of the brain and surrounding structures were obtained without intravenous contrast.  COMPARISON:  Head CT same day.  MRI 11/01/2013.  FINDINGS: Diffusion imaging does not show any acute infarction. Changes related to subacute infarction in the right anterior cerebral artery territory are evident. No evidence of extension of the infarction.  Old cerebellar infarction on the right appears the same. Micro hemorrhages in the cerebellum appear the same and are chronic. Absent flow in the right vertebral artery again noted. Old infarction affects the right lateral medulla.  The cerebral hemispheres elsewhere show chronic small vessel disease within the white matter. Few scattered micro hemorrhages are unchanged. These are old. Old infarction in the left basal ganglia/ external capsule region. Old small vessel thalamic infarctions. No mass lesion, acute  hemorrhage, hydrocephalus or extra-axial collection. No pituitary mass. Chronic opacification of the left maxillary sinus again noted.  IMPRESSION: No evidence of new infarction or extension of the anterior cerebral artery infarction on the right. Subacute infarction in that vascular territory.  Old right cerebellar and right lateral medulla infarction. Chronic small-vessel changes elsewhere throughout the brain.   Electronically Signed   By: Nelson Chimes M.D.   On: 11/23/2013 18:53   EEG 12/04/2013: Impression: this is a normal awake and drowsy EEG. Please, be aware that a normal EEG does not exclude the possibility of epilepsy.  Clinical correlation advised.  Admission HPI:  Mr. Massie is a 63 year old African American male with PMH of recent R ACA stroke (March 2015), HTN, COPD, and ?prostate cancer who was recently discharged from the hospital on 11/24/13 for generalized weakness presents to the hospital today via EMS after a approximately ~3 minuted witnessed "shaking" episodes by family. Upon talking to the patient, he says his roommate called  the ambulance due to the shaking. He also reports similar prior episode last week but did not remember to mention it to Dr. Aundra Dubin yesterday during his hospital follow up appointment. He then subsequently had another witnessed tonic-clonic event by CT staff in the hospital after his head CT was completed on admission. Both episodes seem to have a very brief and spontaneously resolved post-ictal state. At the time of our interview he was alert awake and oriented to person, place, and time. He says he kind of remembers the shaking during CT but does not remember the episode at this house where he lives with his roommate. He denies LOC, loss of bowel or bladder, and no tongue biting.  He has not been drinking a lot of water lately and has decreased po intake with soft food only. He drank beef broth today. No nausea or vomiting. No bleeding in urine or stool. No chest  pain or shortness of breath.  Of note, his caretaker is his cousin [name]. He says he needs PT and has not followed up with neurology since hospital discharge but has an appointment on Monday per documentation.   Hospital Course by problem list:  New onset seizures in setting of recent CVA- Patient presented w/ 2 witnessed "shaking" events day of admission and possible one more shaking episode at home last week per patient. Also with witnessed seizure event in ED. Very brief spontaneously resolving post-ictal period per staff and EMS. No hx of prior seizure. CT head with no acute findings, old R cerebellar infarction with extensive chronic ischemic changes. MRI 11/23/2013: subacute R ACA infarct, no hemorrhage or mass effect, chronic R cerebellar infarct. UDS negative. UA + ketones, no signs of infection. CK wnl. HIV Ab nonreactive. Neurology was consulted and keppra 500mg  BID was started. EEG was done which was normal, no seizure-like activity seen. As normal EEG does not rule out seizure especially in a patient with recent CVA, keppra 500mg  BID was continued at discharge. Patient needs neurology follow up and I was unable to schedule this for patient as I was told patient or his family needs to call given he is a new patient (they were told to call and given the number for neurology). Overall, patient had no more seizure events since the day of admission. On day of discharge he was neurologically stable (baseline has 4/5 strength throughout, though exam inconsistent and sometimes L weakness is greater than R). PT was consulted and recommended outpatient PT (as patient has medicaid and does not qualify for [facility] services)- a prescription for both outpatient PT and OT was given to patient (though per CM, patient likely only qualifies for 1 visit total). His home lipitor and plavix were continued.  Acute on ?chronic renal failure- Pt with Cr 1.99 on admission, baseline Cr ~1. Likely prerenal as FeUrea 8.5% and  BUN acutely increased (though BUN:Cr <20). He was given IVF overnight with great response (Cr downtrended and was 1.07 on day of discharge). His home HCTZ and lisinopril held during admission, but restarted at discharge.  Hypercalcemia-Ca 10.7 on admission, corrected for albumin of 3.1 is 11.4. Ca level down trended to 9.9, but was 10.3 on morning of discharge. He was on calcium supplementation at home, which is most likely the cause. Home Ca supplement was held during hospitalization and at discharge. Will need repeat BMP at hospital follow up visit to assess. If no improvement may need to  check PTH and consider PTHrp if malignancy does exist (pt claims  to have prostate cancer).  Acute on chronic normocytic anemia-Hb 8.6 on admission, which downtrended to nadir of 7.3 (?dilutional). Hb was 9.9 on last hospital discharge 4/16 and seems to have steadily downtrended since this time last year (Hb12-14). No prior anemia panel. May consider doing one if the anemia persists. No hx of hematochezia or BRBPR. No hematuria either.   Hypokalemia-K 3.1, Mg 1.8 on admission. Pt responded well to oral kdur 58mEq x 1 dose and K stable throughout the rest of his hospitalization. K 4.4 on morning of discharge.   Discharge Vitals:   BP 150/73  Pulse 72  Temp(Src) 97.9 F (36.6 C) (Oral)  Resp 16  SpO2 100%  Discharge Labs:  Results for orders placed during the hospital encounter of 12/02/13 (from the past 24 hour(s))  CBC     Status: Abnormal   Collection Time    12/05/13  4:00 AM      Result Value Ref Range   WBC 7.3  4.0 - 10.5 K/uL   RBC 3.08 (*) 4.22 - 5.81 MIL/uL   Hemoglobin 8.4 (*) 13.0 - 17.0 g/dL   HCT 26.0 (*) 39.0 - 52.0 %   MCV 84.4  78.0 - 100.0 fL   MCH 27.3  26.0 - 34.0 pg   MCHC 32.3  30.0 - 36.0 g/dL   RDW 14.0  11.5 - 15.5 %   Platelets 322  150 - 400 K/uL  BASIC METABOLIC PANEL     Status: Abnormal   Collection Time    12/05/13  4:00 AM      Result Value Ref Range   Sodium 139   137 - 147 mEq/L   Potassium 4.4  3.7 - 5.3 mEq/L   Chloride 103  96 - 112 mEq/L   CO2 26  19 - 32 mEq/L   Glucose, Bld 96  70 - 99 mg/dL   BUN 20  6 - 23 mg/dL   Creatinine, Ser 1.07  0.50 - 1.35 mg/dL   Calcium 10.3  8.4 - 10.5 mg/dL   GFR calc non Af Amer 71 (*) >90 mL/min   GFR calc Af Amer 83 (*) >90 mL/min    Signed: Rebecca Eaton, MD 12/05/2013, 10:51 AM   Time Spent on Discharge: 35 minutes Services Ordered on Discharge: outpatient PT Equipment Ordered on Discharge: 3 in 1 bedside commode

## 2013-12-04 ENCOUNTER — Encounter: Payer: Self-pay | Admitting: Internal Medicine

## 2013-12-04 ENCOUNTER — Ambulatory Visit: Payer: Self-pay | Admitting: Neurology

## 2013-12-04 ENCOUNTER — Inpatient Hospital Stay (HOSPITAL_COMMUNITY): Payer: Medicaid Other

## 2013-12-04 DIAGNOSIS — E43 Unspecified severe protein-calorie malnutrition: Secondary | ICD-10-CM | POA: Insufficient documentation

## 2013-12-04 LAB — BASIC METABOLIC PANEL
BUN: 22 mg/dL (ref 6–23)
CALCIUM: 9.9 mg/dL (ref 8.4–10.5)
CO2: 23 mEq/L (ref 19–32)
Chloride: 105 mEq/L (ref 96–112)
Creatinine, Ser: 1.16 mg/dL (ref 0.50–1.35)
GFR, EST AFRICAN AMERICAN: 75 mL/min — AB (ref >90)
GFR, EST NON AFRICAN AMERICAN: 65 mL/min — AB (ref >90)
Glucose, Bld: 94 mg/dL (ref 70–99)
POTASSIUM: 3.9 meq/L (ref 3.7–5.3)
Sodium: 139 mEq/L (ref 137–147)

## 2013-12-04 LAB — CBC
HCT: 24.7 % — ABNORMAL LOW (ref 39.0–52.0)
HEMOGLOBIN: 8.2 g/dL — AB (ref 13.0–17.0)
MCH: 28 pg (ref 26.0–34.0)
MCHC: 33.2 g/dL (ref 30.0–36.0)
MCV: 84.3 fL (ref 78.0–100.0)
PLATELETS: 300 10*3/uL (ref 150–400)
RBC: 2.93 MIL/uL — ABNORMAL LOW (ref 4.22–5.81)
RDW: 14 % (ref 11.5–15.5)
WBC: 7.3 10*3/uL (ref 4.0–10.5)

## 2013-12-04 MED ORDER — ENSURE COMPLETE PO LIQD
237.0000 mL | Freq: Two times a day (BID) | ORAL | Status: DC
  Administered 2013-12-05: 237 mL via ORAL

## 2013-12-04 MED ORDER — LEVETIRACETAM 500 MG PO TABS: 500.0000 mg | ORAL_TABLET | Freq: Two times a day (BID) | ORAL | Status: DC

## 2013-12-04 NOTE — Progress Notes (Signed)
Subjective: Patient feeling well this morning, no complaints. No new focal neurologic s/s. No seizures or seizure-like activity overnight. EEG still pending. I was called by CM this morning and pt/family requesting a bedside commode for home.   Objective: Vital signs in last 24 hours: Filed Vitals:   12/03/13 0900 12/03/13 1817 12/03/13 2204 12/04/13 0545  BP:  106/68 146/73 132/75  Pulse: 74 67 60 69  Temp:  97.4 F (36.3 C) 98 F (36.7 C) 98 F (36.7 C)  TempSrc:  Oral Oral Oral  Resp:  18 18 18   SpO2: 98% 100% 97% 100%   Weight change:  No intake or output data in the 24 hours ending 12/04/13 0759  Physical Exam: General: resting comfortably in bed, NAD HEENT: PERRL, vision grossly intact Cardiac: RRR Pulm: CTAB Abd: soft, nontender, nondistended, normal bowel sounds Ext: warm and well perfused, no pedal edema Neuro: alert and oriented X3, cranial nerves II-XII grossly intact, strength 4/5 all extremities, sensation grossly intact  Lab Results: Basic Metabolic Panel:  Recent Labs Lab 12/02/13 1514 12/02/13 1805 12/03/13 0538  NA 139  --  141  K 3.1*  --  3.9  CL 102  --  108  CO2 23  --  23  GLUCOSE 109*  --  88  BUN 32*  --  31*  CREATININE 1.99*  --  1.53*  CALCIUM 10.7*  --  9.8  MG  --  1.8  --    CBC:  Recent Labs Lab 12/03/13 0538 12/03/13 2000  WBC 7.1 6.5  HGB 7.7* 7.3*  HCT 23.6* 22.3*  MCV 85.2 84.5  PLT 255 260   Cardiac Enzymes:  Recent Labs Lab 12/02/13 1805  CKTOTAL 187  TROPONINI <0.30   Urine Drug Screen: Drugs of Abuse     Component Value Date/Time   LABOPIA NONE DETECTED 12/02/2013 1935   LABOPIA NEGATIVE 10/14/2010 2140   COCAINSCRNUR NONE DETECTED 12/02/2013 1935   COCAINSCRNUR  Value: POSITIVE (NOTE) Result repeated and verified. Sent for confirmatory testing* 10/14/2010 2140   LABBENZ NONE DETECTED 12/02/2013 1935   LABBENZ NEGATIVE 10/14/2010 2140   AMPHETMU NONE DETECTED 12/02/2013 1935   AMPHETMU NEGATIVE 10/14/2010  2140   THCU NONE DETECTED 12/02/2013 1935   LABBARB NONE DETECTED 12/02/2013 1935    Urinalysis:  Recent Labs Lab 12/02/13 1935  COLORURINE AMBER*  LABSPEC 1.023  PHURINE 5.0  GLUCOSEU NEGATIVE  HGBUR NEGATIVE  BILIRUBINUR SMALL*  KETONESUR 15*  PROTEINUR NEGATIVE  UROBILINOGEN 0.2  NITRITE NEGATIVE  LEUKOCYTESUR NEGATIVE   Micro Results: No results found for this or any previous visit (from the past 240 hour(s)). Studies/Results: Ct Head Wo Contrast  12/02/2013   CLINICAL DATA:  Seizure like activity.  EXAM: CT HEAD WITHOUT CONTRAST  TECHNIQUE: Contiguous axial images were obtained from the base of the skull through the vertex without intravenous contrast.  COMPARISON:  MRI 11/23/2013. Multiple previous examinations as far back as 2012.  FINDINGS: There is old infarction at the inferior cerebellum on the right. There are extensive chronic ischemic changes throughout the hemispheric white matter bilaterally. No sign of acute infarction, mass lesion, hemorrhage, hydrocephalus or extra-axial collection. There is chronic opacification of the left maxillary sinus. No acute calvarial lesion.  IMPRESSION: No acute finding by CT. Old right cerebellar infarction. Extensive chronic ischemic changes throughout the cerebral hemispheric white matter.   Electronically Signed   By: Nelson Chimes M.D.   On: 12/02/2013 15:38   Medications: I have reviewed the  patient's current medications. Scheduled Meds: . atorvastatin  80 mg Oral Daily  . clopidogrel  75 mg Oral Q breakfast  . heparin  5,000 Units Subcutaneous 3 times per day  . levETIRAcetam  500 mg Oral BID  . multivitamin with minerals  1 tablet Oral Daily  . sodium chloride  3 mL Intravenous Q12H  . tiotropium  18 mcg Inhalation Daily   Continuous Infusions:  PRN Meds:.albuterol, LORazepam  Assessment/Plan:  Seizures: No seizures overnight. Patient is neurologically stable. Neurology is following. Plan for EEG today. Continuing Keppra,  will likely require this at discharge. - Keppra 500mg  BID - F/u EEG - Seizure precautions - pt does not qualify for [facility] PT/OT, will need a prescription for outpatient PT/OT  Acute on chronic renal failure:  Improving. Patient's Cr 1.99 on admission and has been down trending, Cr 1.16 today. FeUrea 8.5%, which further emphasizes that this was likely prerenal etiology. Responded well to IVF. -continue holding ACEi/HCTZ.   Hypercalcemia: Improving. Ca 10.7 on admission, corrected to 11.4 with albumin of 3.1. Ca 9.8 yesterday and is stable at 9.9 today. -holding home Ca supplement -recheck in AM   Acute on chronic anemia: Hb down to 8.6 on admisison, was 9.9 on last hospital discharge 4/16. Hgb 7.7-->7.3 yesterday, but is back up to 8.2 today. Tx for Hgb <7. -CBC in AM  Hypokalemia: Resolved. K 3.1 on admission, s/p Kdur 7mEq x 1 and K is stable, K 3.9 today. - AM BMP  DVT PPx: [place] Heparin  Dispo: Disposition is deferred at this time, awaiting improvement of current medical problems.  Anticipated discharge in approximately 1 day(s).   The patient does have a current PCP Corky Sox, MD) and does need an [facility] hospital follow-up appointment after discharge.  The patient does not have transportation limitations that hinder transportation to clinic appointments.  .Services Needed at time of discharge: Y = Yes, Blank = No PT:   OT:   RN:   Equipment:   Other:     LOS: 2 days   Rebecca Eaton, MD 12/04/2013, 7:59 AM

## 2013-12-04 NOTE — Progress Notes (Signed)
NEURO HOSPITALIST PROGRESS NOTE   SUBJECTIVE:                                                                                                                        No further seizures over night. No SE on Keppra.   OBJECTIVE:                                                                                                                           Vital signs in last 24 hours: Temp:  [97.4 F (36.3 C)-98 F (36.7 C)] 98 F (36.7 C) (04/27 0545) Pulse Rate:  [60-69] 69 (04/27 0545) Resp:  [18] 18 (04/27 0545) BP: (106-146)/(68-75) 132/75 mmHg (04/27 0545) SpO2:  [97 %-100 %] 100 % (04/27 0545)  Intake/Output from previous day:   Intake/Output this shift: Total I/O In: 360 [P.O.:360] Out: 250 [Urine:250] Nutritional status: Criss Rosales  Past Medical History  Diagnosis Date  . Hypertension 05/18/2007  . COPD 05/18/2007  . Elevated PSA 05/25/2007  . 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 Stroke 12/05/2012  . Prostate cancer      Neurologic Exam:  Mental Status: Alert, oriented, thought content appropriate.  Speech dysarhtric without evidence of aphasia.  Able to follow 3 step commands without difficulty. Cranial Nerves: II: Discs flat bilaterally; Visual fields grossly normal, pupils equal, round, reactive to light and accommodation III,IV, VI: ptosis not present, extra-ocular motions intact bilaterally V,VII: smile asymmetric on the left, facial light touch sensation decreased on the left VIII: hearing normal bilaterally IX,X: gag reflex present XI: bilateral shoulder shrug XII: midline tongue extension without atrophy or fasciculations  Motor: Right : Upper extremity   5/5    Left:     Upper extremity   4/5  Lower extremity   5/5     Lower extremity   4/5 Tone and bulk:normal tone throughout; no atrophy noted Sensory: Pinprick and light touch intact throughout, bilaterally Deep Tendon Reflexes:  Right: Upper Extremity   Left:  Upper extremity   biceps (C-5 to C-6) 2/4   biceps (C-5 to C-6) 2/4 tricep (C7) 2/4    triceps (C7) 2/4 Brachioradialis (C6) 2/4  Brachioradialis (C6) 2/4  Lower Extremity Lower Extremity  quadriceps (  L-2 to L-4) 1/4   quadriceps (L-2 to L-4) 1/4 Achilles (S1) 0/4   Achilles (S1) 0/4  Plantars: Right: downgoing   Left: mute    Lab Results: Basic Metabolic Panel:  Recent Labs Lab 12/01/13 1137 12/02/13 1514 12/02/13 1805 12/03/13 0538 12/04/13 0759  NA 140 139  --  141 139  K 3.6 3.1*  --  3.9 3.9  CL 103 102  --  108 105  CO2 26 23  --  23 23  GLUCOSE 102* 109*  --  88 94  BUN 21 32*  --  31* 22  CREATININE 1.42* 1.99*  --  1.53* 1.16  CALCIUM 10.2 10.7*  --  9.8 9.9  MG  --   --  1.8  --   --     Liver Function Tests: No results found for this basename: AST, ALT, ALKPHOS, BILITOT, PROT, ALBUMIN,  in the last 168 hours No results found for this basename: LIPASE, AMYLASE,  in the last 168 hours No results found for this basename: AMMONIA,  in the last 168 hours  CBC:  Recent Labs Lab 12/02/13 1514 12/03/13 0538 12/03/13 2000 12/04/13 0759  WBC 7.0 7.1 6.5 7.3  HGB 8.6* 7.7* 7.3* 8.2*  HCT 25.3* 23.6* 22.3* 24.7*  MCV 84.3 85.2 84.5 84.3  PLT 282 255 260 300    Cardiac Enzymes:  Recent Labs Lab 12/02/13 1805  CKTOTAL 187  TROPONINI <0.30    Lipid Panel: No results found for this basename: CHOL, TRIG, HDL, CHOLHDL, VLDL, LDLCALC,  in the last 168 hours  CBG: No results found for this basename: GLUCAP,  in the last 168 hours  Microbiology: Results for orders placed during the hospital encounter of 10/24/13  CULTURE, BLOOD (ROUTINE X 2)     Status: None   Collection Time    10/24/13  6:45 PM      Result Value Ref Range Status   Specimen Description BLOOD LEFT ARM   Final   Special Requests BOTTLES DRAWN AEROBIC AND ANAEROBIC 5CC   Final   Culture  Setup Time     Final   Value: 10/25/2013 01:02     Performed at [facility]   Culture      Final   Value: NO GROWTH 5 DAYS     Performed at [facility]   Report Status 10/31/2013 FINAL   Final  CULTURE, BLOOD (ROUTINE X 2)     Status: None   Collection Time    10/24/13  7:17 PM      Result Value Ref Range Status   Specimen Description BLOOD RIGHT FOREARM   Final   Special Requests BOTTLES DRAWN AEROBIC AND ANAEROBIC 5CC   Final   Culture  Setup Time     Final   Value: 10/25/2013 01:04     Performed at [facility]   Culture     Final   Value: NO GROWTH 5 DAYS     Performed at [facility]   Report Status 10/31/2013 FINAL   Final  URINE CULTURE     Status: None   Collection Time    10/25/13  4:10 AM      Result Value Ref Range Status   Specimen Description URINE, CLEAN CATCH   Final   Special Requests Normal   Final   Culture  Setup Time     Final   Value: 10/25/2013 08:05     Performed at [facility]  Colony Count     Final   Value: >=100,000 COLONIES/ML     Performed at [facility]   Culture     Final   Value: Multiple bacterial morphotypes present, none predominant. Suggest appropriate recollection if clinically indicated.     Performed at [facility]   Report Status 10/26/2013 FINAL   Final  GRAM STAIN     Status: None   Collection Time    10/25/13  4:11 AM      Result Value Ref Range Status   Specimen Description URINE, CLEAN CATCH   Final   Special Requests Normal   Final   Gram Stain     Final   Value: [id number]     WBC PRESENT,BOTH PMN AND MONONUCLEAR     Multiple bacterial morphotypes present, none predominant. Suggest appropriate recollection if clinically indicated.     YEAST     Results Called toZelphia Cairo RN [phone] Perlie Mayo   Report Status 10/25/2013 FINAL   Final    Coagulation Studies: No results found for this basename: LABPROT, INR,  in the last 72 hours  Imaging: Ct Head Wo Contrast  12/02/2013   CLINICAL DATA:  Seizure like activity.  EXAM: CT HEAD WITHOUT CONTRAST   TECHNIQUE: Contiguous axial images were obtained from the base of the skull through the vertex without intravenous contrast.  COMPARISON:  MRI 11/23/2013. Multiple previous examinations as far back as 2012.  FINDINGS: There is old infarction at the inferior cerebellum on the right. There are extensive chronic ischemic changes throughout the hemispheric white matter bilaterally. No sign of acute infarction, mass lesion, hemorrhage, hydrocephalus or extra-axial collection. There is chronic opacification of the left maxillary sinus. No acute calvarial lesion.  IMPRESSION: No acute finding by CT. Old right cerebellar infarction. Extensive chronic ischemic changes throughout the cerebral hemispheric white matter.   Electronically Signed   By: Nelson Chimes M.D.   On: 12/02/2013 15:38       MEDICATIONS                                                                                                                        Scheduled: . atorvastatin  80 mg Oral Daily  . clopidogrel  75 mg Oral Q breakfast  . heparin  5,000 Units Subcutaneous 3 times per day  . levETIRAcetam  500 mg Oral BID  . multivitamin with minerals  1 tablet Oral Daily  . sodium chloride  3 mL Intravenous Q12H  . tiotropium  18 mcg Inhalation Daily    ASSESSMENT/PLAN:  63 y.o. male, right handed, with recent R ACA stroke and reported seizures at home as well as a witnessed generalized seizure. No further seizures while on Keppra.  EEG pending.   Recommend: 1) Continue Keppra 500 mg BID 2) EEG pending     Assessment and plan discussed with with attending physician and they are in agreement.    Etta Quill PA-C [facility] [phone]  12/04/2013, 9:23 AM

## 2013-12-04 NOTE — Procedures (Signed)
EEG report.  Brief clinical history: .63 y.o. male, right handed, with a past medical history significant for HTN, recent R ACA stroke and reported seizures at home as well as a witnessed generalized seizure today that apparently was not associated with post ictal state.   Technique: this is a 17 channel routine scalp EEG performed at the bedside with bipolar and monopolar montages arranged in accordance to the international 10/20 system of electrode placement. One channel was dedicated to EKG recording.  The study was performed during wakefulness and drowsiness  Hyperventilation and intermittent photic stimulation were not utilized as activating procedures.  Description:In the wakeful state, the best background consisted of a medium amplitude, posterior dominant, well sustained, symmetric and reactive 9 Hz rhythm. Drowsiness demonstrated dropout of the alpha rhythm. No epileptiform discharges seen. No slowing noted. EKG showed sinus rhythm.  Impression: this is a normal awake and drowsy EEG. Please, be aware that a normal EEG does not exclude the possibility of epilepsy.  Clinical correlation advised.  Dorian Pod, MD [facility]

## 2013-12-04 NOTE — Progress Notes (Signed)
Physical Therapy Treatment Patient Details Name: [name] MRN: [id number] DOB: 1950-11-06 Today's Date: 12/04/2013    History of Present Illness 63 y.o. male admitted to [facility] on 12/02/13 with wittnessed shaking episodes.  Pt being worked up for possible seizure.  Of note, he was recently d/c from [facility] on 11/24/13 and this admission is his 4th admission this year.  Pt with significant PMhx of HTN, COPD, stroke (with some mild left sided weakness), COPD, right leg pinning, and right toe amputation.      PT Comments    Pt seems weaker and more scared on his feet today.  He was unable to walk as far and needed multiple sitting rest breaks.  He reports he wants to go home with his cousin who is in the nursing field.  I broached the subject of SNF for rehab and he says he has been before and did not want to go back.    Follow Up Recommendations  Outpatient PT;Supervision/Assistance - 24 hour (as he will likely not qualify for HHPT)     Equipment Recommendations  None recommended by PT    Recommendations for Other Services   NA     Precautions / Restrictions Precautions Precautions: Fall Precaution Comments: left leg weakness    Mobility  Bed Mobility Overal bed mobility: Modified Independent             General bed mobility comments: Uses bed rail to pull to sitting, HOB 45 degrees  Transfers Overall transfer level: Needs assistance Equipment used: Rolling walker (2 wheeled) Transfers: Sit to/from Stand Sit to Stand: Min assist         General transfer comment: Min assist to support trunk to get to standing.   Ambulation/Gait Ambulation/Gait assistance: Mod assist Ambulation Distance (Feet): 30 Feet (10'x3) Assistive device: Rolling walker (2 wheeled) Gait Pattern/deviations: Step-through pattern;Decreased dorsiflexion - left;Decreased stance time - left;Decreased weight shift to left;Shuffle Gait velocity: decreased   General Gait Details: Pt taking short choppy  steps today, much less confident with gait.  Only after therapist started to stabilize his left knee at his quad (essentially blocking the knee during stance) did he start to take more confident steps on the right and walk a little better.  This is a significant difference from when I saw him last, but when MMT preformed in sitting he was no weaker than on eval.  I am not sure if he is more fearful of falling now.        Modified Rankin (Stroke Patients Only) Modified Rankin (Stroke Patients Only) Pre-Morbid Rankin Score: Slight disability Modified Rankin: Moderately severe disability     Balance Overall balance assessment: Needs assistance Sitting-balance support: Feet supported;No upper extremity supported Sitting balance-Leahy Scale: Good     Standing balance support: Bilateral upper extremity supported Standing balance-Leahy Scale: Poor                      Cognition Arousal/Alertness: Awake/alert Behavior During Therapy: WFL for tasks assessed/performed                   General Comments: Better attention today.     Exercises General Exercises - Lower Extremity Long Arc Quad: AROM;Both;10 reps;Seated Hip ABduction/ADduction: AROM;Both;10 reps;Seated (adduct only against pillow for resistance) Hip Flexion/Marching: AROM;Both;10 reps;Seated Toe Raises: AROM;Both;10 reps;Seated Heel Raises: AROM;Both;10 reps;Seated        Pertinent Vitals/Pain See vitals flow sheet.  PT Goals (current goals can now be found in the care plan section) Acute Rehab PT Goals Patient Stated Goal: to go live with his cousin Progress towards PT goals: Not progressing toward goals - comment (functionally weaker today)    Frequency  Min 3X/week    PT Plan Current plan remains appropriate       End of Session Equipment Utilized During Treatment: Gait belt Activity Tolerance: Patient limited by fatigue Patient left: in chair;with call bell/phone within  reach;with chair alarm set     Time: 2122-4825 PT Time Calculation (min): 23 min  Charges:  $Gait Training: 8-22 mins $Therapeutic Exercise: 8-22 mins                      Daris Harkins B. Gaffney, [place], DPT [phone]   12/04/2013, 6:32 PM

## 2013-12-04 NOTE — Progress Notes (Signed)
EEG Completed; Results Pending  

## 2013-12-04 NOTE — Progress Notes (Signed)
INITIAL NUTRITION ASSESSMENT  DOCUMENTATION CODES Per approved criteria  -Severe malnutrition in the context of acute illness or injury  Pt meets criteria for SEVERE MALNUTRITION in the context of ACUTE ILLNESS/INJURY as evidenced by 12% weight loss in 6 weeks and moderate muscle wasting per physical exam.  INTERVENTION: Provide Ensure Complete BID in between meals Provide Magic Cup once daily Provide snack once daily  NUTRITION DIAGNOSIS: Inadequate oral intake related to decreased appetite, chewing difficulty and limited food options as evidenced by 12% weight loss in less than 2 months.   Goal: Pt to meet >/= 90% of their estimated nutrition needs   Monitor:  PO intake, weight trend, labs  Reason for Assessment: Malnutrition Screening Tool/ Consult for Poor PO intake and Weight loss  63 y.o. male  Admitting Dx: Seizures  ASSESSMENT: 63 year old African American male with PMH of recent R ACA stroke (March 2015), HTN, COPD, and ?prostate cancer who was recently discharged from the hospital on 11/24/13 for generalized weakness presents to the hospital today via EMS after a approximately ~3 minuted witnessed "shaking" episodes by family.   Weight history shows pt has lost 20 lbs within the past 6 weeks- 12% weight loss. Per nursing notes pt ate 75% of breakfast today. Pt reports eating about 50% of his lunch today.  RD met with pt 6 weeks ago at which point pt was on Pureed foods diet due to complete dental extraction. Pt describes his appetite as being fair for the past 6 weeks. He states weight loss is due to eating less as he has ongoing chewing difficulty and has been eating the same soft foods everyday which quickly got tired of.   Nutrition Focused Physical Exam:  Subcutaneous Fat:  Orbital Region: wnl Upper Arm Region: wnl Thoracic and Lumbar Region: NA  Muscle:  Temple Region: mild wasting Clavicle Bone Region: mild wasting Clavicle and Acromion Bone Region: mild  wasting Scapular Bone Region: NA Dorsal Hand: mild/moderate wasting Patellar Region: mild wasting Anterior Thigh Region: moderate wasting Posterior Calf Region: moderate wasting  Edema: none   Height: Ht Readings from Last 1 Encounters:  12/01/13 5' 8.5" (1.74 m)    Weight: Wt Readings from Last 1 Encounters:  12/01/13 153 lb 3.2 oz (69.491 kg)    Ideal Body Weight: 157 lbs  % Ideal Body Weight: 97%  Wt Readings from Last 10 Encounters:  12/01/13 153 lb 3.2 oz (69.491 kg)  11/23/13 153 lb 6.4 oz (69.582 kg)  11/02/13 163 lb (73.936 kg)  10/27/13 169 lb 15.6 oz (77.1 kg)  10/23/13 173 lb 15.1 oz (78.9 kg)  10/23/13 173 lb 15.1 oz (78.9 kg)  10/20/13 173 lb (78.472 kg)  10/16/13 173 lb 6.4 oz (78.654 kg)  10/10/13 174 lb 6.4 oz (79.107 kg)  06/19/13 187 lb (84.823 kg)    Usual Body Weight: 175 lbs  % Usual Body Weight: 87%  BMI: Body Mass Index of 22.9 kg/(m^2)    Estimated Nutritional Needs: Kcal: 1900-2100 Protein: 90-105 grams Fluid: 1.9-2.1 L/day  Skin: WDL  Diet Order: Criss Rosales  EDUCATION NEEDS: -No education needs identified at this time   Intake/Output Summary (Last 24 hours) at 12/04/13 1409 Last data filed at 12/04/13 0844  Gross per 24 hour  Intake    360 ml  Output    250 ml  Net    110 ml    Last BM: 4/25  Labs:   Recent Labs Lab 12/02/13 1514 12/02/13 1805 12/03/13 0538 12/04/13 0759  NA 139  --  141 139  K 3.1*  --  3.9 3.9  CL 102  --  108 105  CO2 23  --  23 23  BUN 32*  --  31* 22  CREATININE 1.99*  --  1.53* 1.16  CALCIUM 10.7*  --  9.8 9.9  MG  --  1.8  --   --   GLUCOSE 109*  --  88 94    CBG (last 3)  No results found for this basename: GLUCAP,  in the last 72 hours  Scheduled Meds: . atorvastatin  80 mg Oral Daily  . clopidogrel  75 mg Oral Q breakfast  . heparin  5,000 Units Subcutaneous 3 times per day  . levETIRAcetam  500 mg Oral BID  . multivitamin with minerals  1 tablet Oral Daily  . sodium chloride   3 mL Intravenous Q12H  . tiotropium  18 mcg Inhalation Daily    Continuous Infusions:   Past Medical History  Diagnosis Date  . Hypertension 05/18/2007  . COPD 05/18/2007  . Elevated PSA 05/25/2007  . 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 Stroke 12/05/2012  . Prostate cancer     Past Surgical History  Procedure Laterality Date  . 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 Multiple extractions with alveoloplasty N/A 10/23/2013    Procedure: Extraction of tooth #'s 1,2,3,4,5,6,7,11,12,13,14,15,16,20,21,22,23,24,27,28,29,32 with alveoloplasty and bilateral maxillary tuberosity reductions;  Surgeon: Lenn Cal, DDS;  Location: [place];  Service: Oral Surgery;  Laterality: N/A;  . Leg surgery      pin  right leg    Pryor Ochoa RD, LDN Inpatient Clinical Dietitian Pager: [phone] After Hours Pager: [phone]

## 2013-12-05 ENCOUNTER — Encounter (HOSPITAL_COMMUNITY): Payer: Self-pay | Admitting: Dentistry

## 2013-12-05 LAB — CBC
HCT: 26 % — ABNORMAL LOW (ref 39.0–52.0)
Hemoglobin: 8.4 g/dL — ABNORMAL LOW (ref 13.0–17.0)
MCH: 27.3 pg (ref 26.0–34.0)
MCHC: 32.3 g/dL (ref 30.0–36.0)
MCV: 84.4 fL (ref 78.0–100.0)
PLATELETS: 322 10*3/uL (ref 150–400)
RBC: 3.08 MIL/uL — AB (ref 4.22–5.81)
RDW: 14 % (ref 11.5–15.5)
WBC: 7.3 10*3/uL (ref 4.0–10.5)

## 2013-12-05 LAB — BASIC METABOLIC PANEL
BUN: 20 mg/dL (ref 6–23)
CHLORIDE: 103 meq/L (ref 96–112)
CO2: 26 meq/L (ref 19–32)
CREATININE: 1.07 mg/dL (ref 0.50–1.35)
Calcium: 10.3 mg/dL (ref 8.4–10.5)
GFR calc non Af Amer: 71 mL/min — ABNORMAL LOW (ref >90)
GFR, EST AFRICAN AMERICAN: 83 mL/min — AB (ref >90)
Glucose, Bld: 96 mg/dL (ref 70–99)
POTASSIUM: 4.4 meq/L (ref 3.7–5.3)
SODIUM: 139 meq/L (ref 137–147)

## 2013-12-05 MED ORDER — ENSURE COMPLETE PO LIQD: 237.0000 mL | Freq: Two times a day (BID) | ORAL | Status: DC

## 2013-12-05 NOTE — Clinical Documentation Improvement (Signed)
Possible Clinical Conditions?  L side hemiparesis or hemiplegia  _______Other Condition__________________ _______Cannot Clinically Determine   Supporting Information:  Per 12/03/13 PT evaluation= Difficulty walking;Abnormality of gait;Generalized weakness;Hemiplegia non-dominant side.  Per 12/03/13 MD Consult = recent R ACA stroke (March 2015 with residual left sided weakness.   Thank You, Serena Colonel ,RN Clinical Documentation Specialist:  [employer]

## 2013-12-05 NOTE — Progress Notes (Signed)
Pt. [place] home via car.  DC instructions and prescriptions given to patient.  3:1 bed commode was given to patient.  Vital signs and assessments were stable.

## 2013-12-05 NOTE — Progress Notes (Signed)
Subjective: Patient feeling well this morning, no complaints. No seizure events overnight. EEG wnl. Patient feels ready for discharge. He plans to move in with his cousin who can provide assistance to him on discharge.  Objective: Vital signs in last 24 hours: Filed Vitals:   12/04/13 1800 12/04/13 2143 12/05/13 0218 12/05/13 0536  BP: 113/59 110/71 149/71 131/72  Pulse: 77 76 71 70  Temp: 99 F (37.2 C) 98.4 F (36.9 C) 98.3 F (36.8 C) 98 F (36.7 C)  TempSrc: Oral Oral Oral Oral  Resp: 18 16 16 16   SpO2: 100% 99% 100% 98%   Weight change:   Intake/Output Summary (Last 24 hours) at 12/05/13 0946 Last data filed at 12/05/13 0700  Gross per 24 hour  Intake    960 ml  Output    500 ml  Net    460 ml    Physical Exam: General: resting comfortably in bed, NAD HEENT: vision grossly intact Cardiac: RRR Pulm: CTAB Abd: soft, nontender, nondistended, normal bowel sounds Ext: warm and well perfused, no pedal edema Neuro: alert and oriented X3, cranial nerves II-XII grossly intact, strength 4/5 all extremities, sensation grossly intact  Lab Results: Basic Metabolic Panel:  Recent Labs Lab 12/02/13 1805  12/04/13 0759 12/05/13 0400  NA  --   < > 139 139  K  --   < > 3.9 4.4  CL  --   < > 105 103  CO2  --   < > 23 26  GLUCOSE  --   < > 94 96  BUN  --   < > 22 20  CREATININE  --   < > 1.16 1.07  CALCIUM  --   < > 9.9 10.3  MG 1.8  --   --   --   < > = values in this interval not displayed. CBC:  Recent Labs Lab 12/04/13 0759 12/05/13 0400  WBC 7.3 7.3  HGB 8.2* 8.4*  HCT 24.7* 26.0*  MCV 84.3 84.4  PLT 300 322   Cardiac Enzymes:  Recent Labs Lab 12/02/13 1805  CKTOTAL 187  TROPONINI <0.30   Urine Drug Screen: Drugs of Abuse     Component Value Date/Time   LABOPIA NONE DETECTED 12/02/2013 1935   LABOPIA NEGATIVE 10/14/2010 2140   COCAINSCRNUR NONE DETECTED 12/02/2013 1935   COCAINSCRNUR  Value: POSITIVE (NOTE) Result repeated and verified. Sent for  confirmatory testing* 10/14/2010 2140   LABBENZ NONE DETECTED 12/02/2013 1935   LABBENZ NEGATIVE 10/14/2010 2140   AMPHETMU NONE DETECTED 12/02/2013 1935   AMPHETMU NEGATIVE 10/14/2010 2140   THCU NONE DETECTED 12/02/2013 1935   LABBARB NONE DETECTED 12/02/2013 1935    Urinalysis:  Recent Labs Lab 12/02/13 1935  COLORURINE AMBER*  LABSPEC 1.023  PHURINE 5.0  GLUCOSEU NEGATIVE  HGBUR NEGATIVE  BILIRUBINUR SMALL*  KETONESUR 15*  PROTEINUR NEGATIVE  UROBILINOGEN 0.2  NITRITE NEGATIVE  LEUKOCYTESUR NEGATIVE   Micro Results: No results found for this or any previous visit (from the past 240 hour(s)). Studies/Results: No results found. Medications: I have reviewed the patient's current medications. Scheduled Meds: . atorvastatin  80 mg Oral Daily  . clopidogrel  75 mg Oral Q breakfast  . feeding supplement (ENSURE COMPLETE)  237 mL Oral BID BM  . heparin  5,000 Units Subcutaneous 3 times per day  . levETIRAcetam  500 mg Oral BID  . multivitamin with minerals  1 tablet Oral Daily  . sodium chloride  3 mL Intravenous Q12H  .  tiotropium  18 mcg Inhalation Daily   Continuous Infusions:  PRN Meds:.albuterol, LORazepam  Assessment/Plan:  Seizures: No seizures overnight. VSS. EEG showed normal awake/drowsy state, no seizure-like activity. From our standpoint, he is ready for discharge. I gave him prescriptions for outpatient PT/OT since his medicaid will not cover home health services. - neurology following- will attempt to arrange follow up appointment for patient - Keppra 500mg  BID; will discharge w/ this medication  - Seizure precautions  Acute on chronic renal failure:  Resolved. Cr 1.07 today. Can restart ACEi/HCTZ on discharge.  Hypercalcemia: Can 9.9 yesterday and is 10.3 today. Will discontinue calcium supplementation at discharge.  -will need BMP monitored as outpatient  Acute on chronic anemia: Stable, Hb 8.4 which is his baseline.  Hypokalemia: Resolved.K 4.4  today.  DVT PPx: [place] Heparin  Dispo: Discharge likely today as long as neurology agrees  The patient does have a current PCP Corky Sox, MD) and does need an [facility] hospital follow-up appointment after discharge.  The patient does not have transportation limitations that hinder transportation to clinic appointments.  .Services Needed at time of discharge: Y = Yes, Blank = No PT:   OT:   RN:   Equipment:   Other:     LOS: 3 days   Rebecca Eaton, MD 12/05/2013, 9:46 AM

## 2013-12-05 NOTE — Discharge Instructions (Signed)
Please continue taking keppra 500mg  twice per day. You also need to make sure you follow up with neurology. Please call them to make an appointment within the next few days.  Seizure, Adult A seizure is abnormal electrical activity in the brain. Seizures usually last from 30 seconds to 2 minutes. There are various types of seizures. Before a seizure, you may have a warning sensation (aura) that a seizure is about to occur. An aura may include the following symptoms:   Fear or anxiety.  Nausea.  Feeling like the room is spinning (vertigo).  Vision changes, such as seeing flashing lights or spots. Common symptoms during a seizure include:  A change in attention or behavior (altered mental status).  Convulsions with rhythmic jerking movements.  Drooling.  Rapid eye movements.  Grunting.  Loss of bladder and bowel control.  Bitter taste in the mouth.  Tongue biting. After a seizure, you may feel confused and sleepy. You may also have an injury resulting from convulsions during the seizure. HOME CARE INSTRUCTIONS   If you are given medicines, take them exactly as prescribed by your health care provider.  Keep all follow-up appointments as directed by your health care provider.  Do not swim or drive or engage in risky activity during which a seizure could cause further injury to you or others until your health care provider says it is OK.  Get adequate rest.  Teach friends and family what to do if you have a seizure. They should:  Lay you on the ground to prevent a fall.  Put a cushion under your head.  Loosen any tight clothing around your neck.  Turn you on your side. If vomiting occurs, this helps keep your airway clear.  Stay with you until you recover.  Know whether or not you need emergency care. SEEK IMMEDIATE MEDICAL CARE IF:  The seizure lasts longer than 5 minutes.  The seizure is severe or you do not wake up immediately after the seizure.  You have an  altered mental status after the seizure.  You are having more frequent or worsening seizures. Someone should drive you to the emergency department or call local emergency services (911 in [place] MAKE SURE YOU:  Understand these instructions.  Will watch your condition.  Will get help right away if you are not doing well or get worse. Document Released: 07/24/2000 Document Revised: 05/17/2013 Document Reviewed: 03/08/2013 [facility] Patient Information 2014 [place].

## 2013-12-05 NOTE — Care Management Note (Unsigned)
    Page 1 of 1   12/05/2013     11:46:39 AM CARE MANAGEMENT NOTE 12/05/2013  Patient:  [name], [name]   Account Number:  [id number]  Date Initiated:  12/05/2013  Documentation initiated by:  Lorne Skeens  Subjective/Objective Assessment:   Patient admitted with seizure. Lives at home with a friend. Patient was active with [facility] prior to admission. Patient is also followed by [facility] as outpatient.     Action/Plan:   Will follow for discharge needs.   Anticipated DC Date:  12/05/2013   Anticipated DC Plan:  [place]  CM consult      Choice offered to / List presented to:             Status of service:  Completed, signed off Medicare Important Message given?   (If response is "NO", the following Medicare IM given date fields will be blank) Date Medicare IM given:   Date Additional Medicare IM given:    Discharge Disposition:  HOME/SELF CARE  Per UR Regulation:  Reviewed for med. necessity/level of care/duration of stay  If discussed at [place] of Stay Meetings, dates discussed:    Comments:  12/05/13 [place], MSN, [place] contacted [place] Kidney with [facility] to verify that patient was still active with them.  Per [place] Kidney, patient was last seen on 11/27/13.  At that visit, patient was discovered to have bedbugs and cannot be seen again until situation is resolved.  Additionally, per Medicaid guidelines, patient has only one [facility] visit remaining.  This information was shared with Dr Mechele Claude, who has written a script for patient to have outpatient PT for the remaining visit. Patient will be discharging home with his daughter [name] rather than his personal residence.  Voicemail was left with Joelene Millin with [facility] to notifiy of address change.  Patient's RN was updated.

## 2013-12-08 ENCOUNTER — Emergency Department (HOSPITAL_COMMUNITY)
Admission: EM | Admit: 2013-12-08 | Discharge: 2013-12-09 | Disposition: A | Discharge status: Home / Self Care | Payer: Medicaid Other | Attending: Emergency Medicine | Admitting: Emergency Medicine

## 2013-12-08 ENCOUNTER — Encounter (HOSPITAL_COMMUNITY): Payer: Self-pay | Admitting: Emergency Medicine

## 2013-12-08 DIAGNOSIS — F17200 Nicotine dependence, unspecified, uncomplicated: Secondary | ICD-10-CM | POA: Insufficient documentation

## 2013-12-08 DIAGNOSIS — R259 Unspecified abnormal involuntary movements: Secondary | ICD-10-CM | POA: Insufficient documentation

## 2013-12-08 DIAGNOSIS — Z8546 Personal history of malignant neoplasm of prostate: Secondary | ICD-10-CM | POA: Insufficient documentation

## 2013-12-08 DIAGNOSIS — I10 Essential (primary) hypertension: Secondary | ICD-10-CM | POA: Insufficient documentation

## 2013-12-08 DIAGNOSIS — I69998 Other sequelae following unspecified cerebrovascular disease: Secondary | ICD-10-CM | POA: Insufficient documentation

## 2013-12-08 DIAGNOSIS — Z880 Allergy status to penicillin: Secondary | ICD-10-CM | POA: Insufficient documentation

## 2013-12-08 DIAGNOSIS — M6281 Muscle weakness (generalized): Secondary | ICD-10-CM | POA: Insufficient documentation

## 2013-12-08 DIAGNOSIS — J4489 Other specified chronic obstructive pulmonary disease: Secondary | ICD-10-CM | POA: Insufficient documentation

## 2013-12-08 DIAGNOSIS — Z79899 Other long term (current) drug therapy: Secondary | ICD-10-CM | POA: Insufficient documentation

## 2013-12-08 DIAGNOSIS — Z7902 Long term (current) use of antithrombotics/antiplatelets: Secondary | ICD-10-CM | POA: Insufficient documentation

## 2013-12-08 DIAGNOSIS — J449 Chronic obstructive pulmonary disease, unspecified: Secondary | ICD-10-CM | POA: Insufficient documentation

## 2013-12-08 DIAGNOSIS — R251 Tremor, unspecified: Secondary | ICD-10-CM

## 2013-12-08 LAB — CBC
HEMATOCRIT: 26.5 % — AB (ref 39.0–52.0)
HEMOGLOBIN: 8.7 g/dL — AB (ref 13.0–17.0)
MCH: 27.7 pg (ref 26.0–34.0)
MCHC: 32.8 g/dL (ref 30.0–36.0)
MCV: 84.4 fL (ref 78.0–100.0)
Platelets: 384 10*3/uL (ref 150–400)
RBC: 3.14 MIL/uL — ABNORMAL LOW (ref 4.22–5.81)
RDW: 14 % (ref 11.5–15.5)
WBC: 8.5 10*3/uL (ref 4.0–10.5)

## 2013-12-08 LAB — BASIC METABOLIC PANEL
BUN: 20 mg/dL (ref 6–23)
CHLORIDE: 101 meq/L (ref 96–112)
CO2: 24 meq/L (ref 19–32)
CREATININE: 1.72 mg/dL — AB (ref 0.50–1.35)
Calcium: 10.3 mg/dL (ref 8.4–10.5)
GFR calc non Af Amer: 40 mL/min — ABNORMAL LOW (ref >90)
GFR, EST AFRICAN AMERICAN: 47 mL/min — AB (ref >90)
GLUCOSE: 117 mg/dL — AB (ref 70–99)
Potassium: 4.3 mEq/L (ref 3.7–5.3)
Sodium: 139 mEq/L (ref 137–147)

## 2013-12-08 LAB — CBG MONITORING, ED: Glucose-Capillary: 116 mg/dL — ABNORMAL HIGH (ref 70–99)

## 2013-12-08 MED ORDER — SODIUM CHLORIDE 0.9 % IV BOLUS (SEPSIS)
1000.0000 mL | Freq: Once | INTRAVENOUS | Status: AC
  Administered 2013-12-08: 1000 mL via INTRAVENOUS

## 2013-12-08 NOTE — Discharge Summary (Signed)
Reviewed. Agree with documentation. 

## 2013-12-08 NOTE — Discharge Instructions (Signed)
Continue your medications as before.  Return to the emergency department if you experience additional episodes or other new and concerning symptoms.

## 2013-12-08 NOTE — ED Provider Notes (Addendum)
CSN: [id number]     Arrival date & time 12/08/13  2127 History   First MD Initiated Contact with Patient 12/08/13 2129     Chief Complaint  Patient presents with  . Seizures  . Weakness     (Consider location/radiation/quality/duration/timing/severity/associated sxs/prior Treatment) HPI Comments: Patient is a 63 year old male resident of a local group home. He was brought for evaluation of possible seizure activity. He apparently had a shaking episode which lasted for approximately 20 seconds. I am told the patient experienced a stroke 5 months ago which left him with left sided weakness. The group home feels as though his weakness is worse since experiencing this seizure.  Patient is a 63 y.o. male presenting with seizures and weakness. The history is provided by the patient.  Seizures Seizure activity on arrival: no   Seizure type:  Grand mal Initial focality:  None Episode characteristics: abnormal movements   Postictal symptoms: no confusion   Return to baseline: yes   Severity:  Moderate Timing:  Once Progression:  Resolved Weakness    Past Medical History  Diagnosis Date  . Hypertension 05/18/2007  . COPD 05/18/2007  . Elevated PSA 05/25/2007  . 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 Stroke 12/05/2012  . Prostate cancer    Past Surgical History  Procedure Laterality Date  . 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 Multiple extractions with alveoloplasty N/A 10/23/2013    Procedure: Extraction of tooth #'s 1,2,3,4,5,6,7,11,12,13,14,15,16,20,21,22,23,24,27,28,29,32 with alveoloplasty and bilateral maxillary tuberosity reductions;  Surgeon: Lenn Cal, DDS;  Location: [place];  Service: Oral Surgery;  Laterality: N/A;  . Leg surgery      pin  right leg   Family History  Problem Relation Age of Onset  . Liver disease Mother   . Heart attack Mother 55  . Colon cancer Neg Hx    History  Substance Use Topics  . Smoking status: Current Some Day Smoker --  0.40 packs/day for 50 years    Types: Cigarettes  . Smokeless tobacco: Never Used     Comment: 1/2 ppd hx, now 3-4 cigs/day  . Alcohol Use: No     Comment: 1 beer a week, used to drink 2-3 beers a day    Review of Systems  Neurological: Positive for seizures and weakness.  All other systems reviewed and are negative.     Allergies  Aspirin and Penicillins  Home Medications   Prior to Admission medications   Medication Sig Start Date End Date Taking? Authorizing Provider  albuterol (PROVENTIL HFA;VENTOLIN HFA) 108 (90 BASE) MCG/ACT inhaler Inhale 2 puffs into the lungs every 6 (six) hours as needed for wheezing. 10/10/13  Yes Corky Sox, MD  atorvastatin (LIPITOR) 80 MG tablet Take 80 mg by mouth daily.   Yes Historical Provider, MD  chlorhexidine (PERIDEX) 0.12 % solution Use as directed 15 mLs in the mouth or throat 2 (two) times daily. Use after breakfast and at bedtime:  Rinse and spit out excess, do not swallow   Yes Historical Provider, MD  clopidogrel (PLAVIX) 75 MG tablet Take 1 tablet (75 mg total) by mouth daily with breakfast. 12/01/13  Yes Cresenciano Genre, MD  lisinopril (PRINIVIL,ZESTRIL) 10 MG tablet Take 1 tablet (10 mg total) by mouth daily. 12/01/13  Yes Cresenciano Genre, MD  Multiple Vitamin (MULTIVITAMIN WITH MINERALS) TABS tablet Take 1 tablet by mouth daily.   Yes Historical Provider, MD  tiotropium (SPIRIVA HANDIHALER) 18 MCG inhalation  capsule Place 1 capsule (18 mcg total) into inhaler and inhale daily. 10/10/13 10/10/14 Yes Corky Sox, MD  feeding supplement, ENSURE COMPLETE, (ENSURE COMPLETE) LIQD Take 237 mLs by mouth 2 (two) times daily between meals. 12/05/13   Rebecca Eaton, MD  hydrochlorothiazide (HYDRODIURIL) 25 MG tablet Take 1 tablet (25 mg total) by mouth daily. 11/09/13   Cresenciano Genre, MD  levETIRAcetam (KEPPRA) 500 MG tablet Take 1 tablet (500 mg total) by mouth 2 (two) times daily. 12/04/13   Rebecca Eaton, MD   BP 91/57  Pulse 97  Temp(Src) 98 F  (36.7 C) (Oral)  Resp 20  SpO2 99% Physical Exam  Nursing note and vitals reviewed. Constitutional: He is oriented to person, place, and time. He appears well-developed and well-nourished. No distress.  Awake, alert, nontoxic appearance.  HENT:  Head: Atraumatic.  Mouth/Throat: Oropharynx is clear and moist.  Eyes: Right eye exhibits no discharge. Left eye exhibits no discharge.  Neck: Normal range of motion. Neck supple.  Cardiovascular: Normal rate, regular rhythm and normal heart sounds.   No murmur heard. Pulmonary/Chest: Effort normal and breath sounds normal. He exhibits no tenderness.  Abdominal: Soft. There is no tenderness. There is no rebound.  Musculoskeletal: Normal range of motion. He exhibits no edema and no tenderness.  Baseline ROM, no obvious new focal weakness.  Lymphadenopathy:    He has no cervical adenopathy.  Neurological: He is alert and oriented to person, place, and time. No cranial nerve deficit. He exhibits normal muscle tone. Coordination normal.  Mental status and motor strength appears baseline for patient and situation.  Skin: No rash noted. He is not diaphoretic.  Psychiatric: He has a normal mood and affect.    ED Course  Procedures (including critical care time) Labs Review Labs Reviewed  BASIC METABOLIC PANEL  CBC  CBG MONITORING, ED    Imaging Review No results found.   EKG Interpretation   Date/Time:  Friday Dec 08 2013 21:34:52 EDT Ventricular Rate:  94 PR Interval:  200 QRS Duration: 105 QT Interval:  385 QTC Calculation: 481 R Axis:   -56 Text Interpretation:  Sinus or ectopic atrial rhythm Left anterior  fascicular block Abnormal R-wave progression, early transition Borderline  prolonged QT interval Confirmed by Beau Fanny  MD, Iysha Mishkin ([id number]) on 12/09/2013  12:07:40 AM      MDM   Final diagnoses:  None    Patient is a 63 year old male with history of seizures and CVA. He is a resident of a group home where he had a  shaking episode this evening. This lasted about 20 seconds and it did not appears that he loss consciousness. His neurologic exam is nonfocal with the exception of him being somewhat slow to respond which I suspect is his baseline. Workup reveals his chronic anemia which is unchanged and electrolyte studies which are not different from his baseline. He had a CT scan performed 5 days ago and I do not feel it is appropriate to repeat this giving the clinical situation and his neurologic exam. I feel he is appropriate for return to the group home with when necessary return.   Veryl Speak, MD 12/08/13 8315  Veryl Speak, MD 12/09/13 [phone]

## 2013-12-08 NOTE — ED Notes (Signed)
Per EMS pt is from a group home, staff at group home reports pt had an episode of full body shaking for about 20 seconds, believes the pt has a seizure. EMS reports the pt had a stroke five months ago and was left with left sided deficits included asymmetry of the left side of the face, left arm & leg weakness. EMS reports the pt was adm to the hospital last week a seizure as well. EMS reports the group home voiced that they believed the pt's deficits are worse since he has had the seizure. EMS reports they believe it could be because the pt was post-ictal. MD aware of pt's presentation. Facial asymmetry noted upon initial assessment.

## 2013-12-09 ENCOUNTER — Encounter (HOSPITAL_COMMUNITY): Payer: Self-pay | Admitting: Emergency Medicine

## 2013-12-09 NOTE — ED Notes (Signed)
[facility] transporting pt to group home, this RN acquire address of group home as pt was unsure.   Address: [place] Phone #: [phone]

## 2013-12-11 ENCOUNTER — Encounter (HOSPITAL_COMMUNITY): Payer: Self-pay | Admitting: Emergency Medicine

## 2013-12-11 ENCOUNTER — Emergency Department (HOSPITAL_COMMUNITY)
Admission: EM | Admit: 2013-12-11 | Discharge: 2013-12-11 | Disposition: A | Discharge status: Home / Self Care | Payer: Medicaid Other | Attending: Emergency Medicine | Admitting: Emergency Medicine

## 2013-12-11 ENCOUNTER — Inpatient Hospital Stay (HOSPITAL_COMMUNITY)
Admission: EM | Admit: 2013-12-11 | Discharge: 2013-12-15 | DRG: 100 | Disposition: A | Discharge status: Skilled Nursing Facility | Payer: Medicaid Other | Attending: Internal Medicine | Admitting: Internal Medicine

## 2013-12-11 DIAGNOSIS — Z8673 Personal history of transient ischemic attack (TIA), and cerebral infarction without residual deficits: Secondary | ICD-10-CM

## 2013-12-11 DIAGNOSIS — R972 Elevated prostate specific antigen [PSA]: Secondary | ICD-10-CM | POA: Insufficient documentation

## 2013-12-11 DIAGNOSIS — C7952 Secondary malignant neoplasm of bone marrow: Secondary | ICD-10-CM

## 2013-12-11 DIAGNOSIS — Z79899 Other long term (current) drug therapy: Secondary | ICD-10-CM | POA: Insufficient documentation

## 2013-12-11 DIAGNOSIS — N182 Chronic kidney disease, stage 2 (mild): Secondary | ICD-10-CM | POA: Insufficient documentation

## 2013-12-11 DIAGNOSIS — I10 Essential (primary) hypertension: Secondary | ICD-10-CM

## 2013-12-11 DIAGNOSIS — E876 Hypokalemia: Secondary | ICD-10-CM | POA: Diagnosis present

## 2013-12-11 DIAGNOSIS — N179 Acute kidney failure, unspecified: Secondary | ICD-10-CM | POA: Diagnosis present

## 2013-12-11 DIAGNOSIS — F17200 Nicotine dependence, unspecified, uncomplicated: Secondary | ICD-10-CM | POA: Diagnosis present

## 2013-12-11 DIAGNOSIS — E43 Unspecified severe protein-calorie malnutrition: Secondary | ICD-10-CM | POA: Insufficient documentation

## 2013-12-11 DIAGNOSIS — I639 Cerebral infarction, unspecified: Secondary | ICD-10-CM

## 2013-12-11 DIAGNOSIS — R29898 Other symptoms and signs involving the musculoskeletal system: Secondary | ICD-10-CM | POA: Diagnosis present

## 2013-12-11 DIAGNOSIS — G40909 Epilepsy, unspecified, not intractable, without status epilepticus: Principal | ICD-10-CM | POA: Diagnosis present

## 2013-12-11 DIAGNOSIS — C779 Secondary and unspecified malignant neoplasm of lymph node, unspecified: Secondary | ICD-10-CM

## 2013-12-11 DIAGNOSIS — R569 Unspecified convulsions: Secondary | ICD-10-CM

## 2013-12-11 DIAGNOSIS — F1910 Other psychoactive substance abuse, uncomplicated: Secondary | ICD-10-CM | POA: Diagnosis present

## 2013-12-11 DIAGNOSIS — J4489 Other specified chronic obstructive pulmonary disease: Secondary | ICD-10-CM | POA: Insufficient documentation

## 2013-12-11 DIAGNOSIS — D638 Anemia in other chronic diseases classified elsewhere: Secondary | ICD-10-CM | POA: Diagnosis present

## 2013-12-11 DIAGNOSIS — R1310 Dysphagia, unspecified: Secondary | ICD-10-CM | POA: Diagnosis present

## 2013-12-11 DIAGNOSIS — C50919 Malignant neoplasm of unspecified site of unspecified female breast: Secondary | ICD-10-CM | POA: Diagnosis present

## 2013-12-11 DIAGNOSIS — R531 Weakness: Secondary | ICD-10-CM | POA: Diagnosis present

## 2013-12-11 DIAGNOSIS — I69959 Hemiplegia and hemiparesis following unspecified cerebrovascular disease affecting unspecified side: Secondary | ICD-10-CM | POA: Diagnosis present

## 2013-12-11 DIAGNOSIS — Z8546 Personal history of malignant neoplasm of prostate: Secondary | ICD-10-CM | POA: Insufficient documentation

## 2013-12-11 DIAGNOSIS — C61 Malignant neoplasm of prostate: Secondary | ICD-10-CM | POA: Diagnosis present

## 2013-12-11 DIAGNOSIS — Z7902 Long term (current) use of antithrombotics/antiplatelets: Secondary | ICD-10-CM | POA: Insufficient documentation

## 2013-12-11 DIAGNOSIS — J449 Chronic obstructive pulmonary disease, unspecified: Secondary | ICD-10-CM | POA: Insufficient documentation

## 2013-12-11 DIAGNOSIS — F1010 Alcohol abuse, uncomplicated: Secondary | ICD-10-CM | POA: Diagnosis present

## 2013-12-11 DIAGNOSIS — Z91199 Patient's noncompliance with other medical treatment and regimen due to unspecified reason: Secondary | ICD-10-CM

## 2013-12-11 DIAGNOSIS — Z9119 Patient's noncompliance with other medical treatment and regimen: Secondary | ICD-10-CM

## 2013-12-11 DIAGNOSIS — I129 Hypertensive chronic kidney disease with stage 1 through stage 4 chronic kidney disease, or unspecified chronic kidney disease: Secondary | ICD-10-CM | POA: Diagnosis present

## 2013-12-11 DIAGNOSIS — K5900 Constipation, unspecified: Secondary | ICD-10-CM | POA: Diagnosis present

## 2013-12-11 DIAGNOSIS — D509 Iron deficiency anemia, unspecified: Secondary | ICD-10-CM

## 2013-12-11 DIAGNOSIS — C7951 Secondary malignant neoplasm of bone: Secondary | ICD-10-CM | POA: Diagnosis present

## 2013-12-11 DIAGNOSIS — I6340 Cerebral infarction due to embolism of unspecified cerebral artery: Secondary | ICD-10-CM | POA: Diagnosis present

## 2013-12-11 DIAGNOSIS — R2240 Localized swelling, mass and lump, unspecified lower limb: Secondary | ICD-10-CM | POA: Diagnosis present

## 2013-12-11 DIAGNOSIS — I69998 Other sequelae following unspecified cerebrovascular disease: Secondary | ICD-10-CM

## 2013-12-11 DIAGNOSIS — E785 Hyperlipidemia, unspecified: Secondary | ICD-10-CM | POA: Diagnosis present

## 2013-12-11 HISTORY — DX: Cerebral infarction due to embolism of unspecified cerebral artery: I63.40

## 2013-12-11 HISTORY — DX: Unspecified severe protein-calorie malnutrition: E43

## 2013-12-11 HISTORY — DX: Unspecified convulsions: R56.9

## 2013-12-11 HISTORY — DX: Chronic kidney disease, stage 2 (mild): N18.2

## 2013-12-11 LAB — BASIC METABOLIC PANEL
BUN: 27 mg/dL — ABNORMAL HIGH (ref 6–23)
CO2: 23 mEq/L (ref 19–32)
CREATININE: 1.25 mg/dL (ref 0.50–1.35)
Calcium: 10.1 mg/dL (ref 8.4–10.5)
Chloride: 98 mEq/L (ref 96–112)
GFR calc Af Amer: 69 mL/min — ABNORMAL LOW (ref >90)
GFR calc non Af Amer: 59 mL/min — ABNORMAL LOW (ref >90)
GLUCOSE: 101 mg/dL — AB (ref 70–99)
POTASSIUM: 3.7 meq/L (ref 3.7–5.3)
Sodium: 135 mEq/L — ABNORMAL LOW (ref 137–147)

## 2013-12-11 LAB — CBG MONITORING, ED: Glucose-Capillary: 86 mg/dL (ref 70–99)

## 2013-12-11 MED ORDER — LORAZEPAM 2 MG/ML IJ SOLN
1.0000 mg | Freq: Once | INTRAMUSCULAR | Status: AC
  Administered 2013-12-11: 15:00:00 via INTRAVENOUS

## 2013-12-11 MED ORDER — LEVETIRACETAM 500 MG PO TABS: 500.0000 mg | ORAL_TABLET | Freq: Two times a day (BID) | ORAL | Status: DC

## 2013-12-11 MED ORDER — LORAZEPAM 2 MG/ML IJ SOLN
INTRAMUSCULAR | Status: AC
  Filled 2013-12-11: qty 1

## 2013-12-11 MED ORDER — SODIUM CHLORIDE 0.9 % IV SOLN
1000.0000 mg | Freq: Once | INTRAVENOUS | Status: AC
  Administered 2013-12-11: 1000 mg via INTRAVENOUS
  Filled 2013-12-11 (×2): qty 10

## 2013-12-11 NOTE — Discharge Instructions (Signed)
Seizure, Adult A seizure is abnormal electrical activity in the brain. Seizures usually last from 30 seconds to 2 minutes. There are various types of seizures. Before a seizure, you may have a warning sensation (aura) that a seizure is about to occur. An aura may include the following symptoms:   Fear or anxiety.  Nausea.  Feeling like the room is spinning (vertigo).  Vision changes, such as seeing flashing lights or spots. Common symptoms during a seizure include:  A change in attention or behavior (altered mental status).  Convulsions with rhythmic jerking movements.  Drooling.  Rapid eye movements.  Grunting.  Loss of bladder and bowel control.  Bitter taste in the mouth.  Tongue biting. After a seizure, you may feel confused and sleepy. You may also have an injury resulting from convulsions during the seizure. HOME CARE INSTRUCTIONS   If you are given medicines, take them exactly as prescribed by your health care provider.  Keep all follow-up appointments as directed by your health care provider.  Do not swim or drive or engage in risky activity during which a seizure could cause further injury to you or others until your health care provider says it is OK.  Get adequate rest.  Teach friends and family what to do if you have a seizure. They should:  Lay you on the ground to prevent a fall.  Put a cushion under your head.  Loosen any tight clothing around your neck.  Turn you on your side. If vomiting occurs, this helps keep your airway clear.  Stay with you until you recover.  Know whether or not you need emergency care. SEEK IMMEDIATE MEDICAL CARE IF:  The seizure lasts longer than 5 minutes.  The seizure is severe or you do not wake up immediately after the seizure.  You have an altered mental status after the seizure.  You are having more frequent or worsening seizures. Someone should drive you to the emergency department or call local emergency  services (911 in [place] MAKE SURE YOU:  Understand these instructions.  Will watch your condition.  Will get help right away if you are not doing well or get worse. Document Released: 07/24/2000 Document Revised: 05/17/2013 Document Reviewed: 03/08/2013 [employer] Patient Information 2014 [employer].  

## 2013-12-11 NOTE — ED Provider Notes (Signed)
Medical screening examination/treatment/procedure(s) were conducted as a shared visit with non-physician practitioner(s) or resident and myself. I personally evaluated the patient during the encounter and agree with the findings and plan unless otherwise indicated.  I have personally reviewed any xrays and/ or EKG's with the provider and I agree with interpretation.  Patient with known stroke history left-sided deficits, lives with family for support presents with seizure activity. Patient has a history of seizures focal intermittent retrosternal and Keppra however has not started taking Keppra he is waiting the prescription to arrive. Patient slurred speech at baseline. Patient had brief focal seizure prior to arrival. On exam patient has left-sided weakness and slurred speech similar previous, mild dry mucous membranes. Neck supple no meningismus. Alert and oriented. Keppra ordered in the ER and resident discussed with family importance of Keppra and close followup with neurology.  Stroke, seizure    Mariea Clonts, MD 12/11/13 [phone]

## 2013-12-11 NOTE — ED Notes (Signed)
To ED via [facility] from home, with pt and cousin (caregiver) c/o seizure activity. States was here last Friday for same. Was prescribed Kepra, but hasnt started taking it-- meds delivered today. Cousin would like to talk with MD prior to patient being discharged -- states did not get to talk with anyone after last visit. 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 Is wheelchair bound at home.

## 2013-12-11 NOTE — ED Notes (Signed)
Report given to [place]. In POD -C

## 2013-12-11 NOTE — ED Notes (Signed)
Per EMS: Pt coming from home, seen earlier in the day for seizure. Family report that patient had two additional seizures after returning home. Pt states that he remembers seizures. No post ictal period per family

## 2013-12-11 NOTE — ED Provider Notes (Signed)
CSN: [id number]     Arrival date & time 12/11/13  1345 History   First MD Initiated Contact with Patient 12/11/13 1350     Chief Complaint  Patient presents with  . Seizures     (Consider location/radiation/quality/duration/timing/severity/associated sxs/prior Treatment) The history is provided by the patient and the EMS personnel.   history of present illness: 63 year old male who presents with chief complaint of seizure. Patient has history of stroke with residual left-sided hemiparesis. First seizure-like episode was approximately 2 weeks ago. Patient was admitted at that time with neurology evaluation. He was discharged home on Keppra 500 mg twice a day. Patient states that the ordered his medicine through mail order and he is not yet received his Keppra. He had one additional episode a few days ago where seen in the emergency department discharged home. Patient had one additional episode today. Patient reports the episode involved upper and lower extremity "shaking". Patient was alert throughout the episode and caregiver reported he talks throughout the episode. No postictal. Episode lasted approximately 20-30 seconds and then resolved. Patient denies any recent illness or new neurologic deficits.  Past Medical History  Diagnosis Date  . Hypertension 05/18/2007  . COPD 05/18/2007  . Elevated PSA 05/25/2007  . 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 Stroke 12/05/2012  . Prostate cancer    Past Surgical History  Procedure Laterality Date  . 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 Multiple extractions with alveoloplasty N/A 10/23/2013    Procedure: Extraction of tooth #'s 1,2,3,4,5,6,7,11,12,13,14,15,16,20,21,22,23,24,27,28,29,32 with alveoloplasty and bilateral maxillary tuberosity reductions;  Surgeon: Lenn Cal, DDS;  Location: [place];  Service: Oral Surgery;  Laterality: N/A;  . Leg surgery      pin  right leg   Family History  Problem Relation Age of Onset  .  Liver disease Mother   . Heart attack Mother 71  . Colon cancer Neg Hx    History  Substance Use Topics  . Smoking status: Current Some Day Smoker -- 0.40 packs/day for 50 years    Types: Cigarettes  . Smokeless tobacco: Never Used     Comment: 1/2 ppd hx, now 3-4 cigs/day  . Alcohol Use: No     Comment: 1 beer a week, used to drink 2-3 beers a day    Review of Systems  Constitutional: Negative for fever and chills.  HENT: Negative.   Eyes: Negative.   Respiratory: Negative for cough and shortness of breath.   Cardiovascular: Negative for chest pain and palpitations.  Gastrointestinal: Negative for nausea, vomiting, abdominal pain, diarrhea and constipation.  Genitourinary: Negative.   Musculoskeletal: Negative.   Skin: Negative.   Neurological: Positive for seizures.  All other systems reviewed and are negative.     Allergies  Aspirin and Penicillins  Home Medications   Prior to Admission medications   Medication Sig Start Date End Date Taking? Authorizing Provider  albuterol (PROVENTIL HFA;VENTOLIN HFA) 108 (90 BASE) MCG/ACT inhaler Inhale 2 puffs into the lungs every 6 (six) hours as needed for wheezing. 10/10/13   Corky Sox, MD  atorvastatin (LIPITOR) 80 MG tablet Take 80 mg by mouth daily.    Historical Provider, MD  chlorhexidine (PERIDEX) 0.12 % solution Use as directed 15 mLs in the mouth or throat 2 (two) times daily. Use after breakfast and at bedtime:  Rinse and spit out excess, do not swallow    Historical Provider, MD  clopidogrel (PLAVIX) 75 MG tablet Take 1  tablet (75 mg total) by mouth daily with breakfast. 12/01/13   Cresenciano Genre, MD  feeding supplement, ENSURE COMPLETE, (ENSURE COMPLETE) LIQD Take 237 mLs by mouth 2 (two) times daily between meals. 12/05/13   Rebecca Eaton, MD  hydrochlorothiazide (HYDRODIURIL) 25 MG tablet Take 1 tablet (25 mg total) by mouth daily. 11/09/13   Cresenciano Genre, MD  levETIRAcetam (KEPPRA) 500 MG tablet Take 1 tablet (500  mg total) by mouth 2 (two) times daily. 12/04/13   Rebecca Eaton, MD  lisinopril (PRINIVIL,ZESTRIL) 10 MG tablet Take 1 tablet (10 mg total) by mouth daily. 12/01/13   Cresenciano Genre, MD  Multiple Vitamin (MULTIVITAMIN WITH MINERALS) TABS tablet Take 1 tablet by mouth daily.    Historical Provider, MD  tiotropium (SPIRIVA HANDIHALER) 18 MCG inhalation capsule Place 1 capsule (18 mcg total) into inhaler and inhale daily. 10/10/13 10/10/14  Corky Sox, MD   BP 105/67  Pulse 91  Temp(Src) 97.5 F (36.4 C) (Oral)  Resp 20  Wt 153 lb (69.4 kg)  SpO2 100% Physical Exam  Nursing note and vitals reviewed. Constitutional: He is oriented to person, place, and time. He appears well-developed and well-nourished. No distress.  HENT:  Head: Normocephalic and atraumatic.  Eyes: Conjunctivae are normal. Pupils are equal, round, and reactive to light.  Neck: Neck supple.  Cardiovascular: Normal rate, regular rhythm, normal heart sounds and intact distal pulses.   Pulmonary/Chest: Effort normal and breath sounds normal. He has no wheezes. He has no rales.  Abdominal: Soft. He exhibits no distension. There is no tenderness.  Musculoskeletal: He exhibits no edema and no tenderness.  Neurological: He is alert and oriented to person, place, and time.  Skin: Skin is warm and dry.    ED Course  Procedures (including critical care time) Labs Review Labs Reviewed  BASIC METABOLIC PANEL - Abnormal; Notable for the following:    Sodium 135 (*)    Glucose, Bld 101 (*)    BUN 27 (*)    GFR calc non Af Amer 59 (*)    GFR calc Af Amer 69 (*)    All other components within normal limits  CBG MONITORING, ED    Imaging Review No results found.   EKG Interpretation None      MDM   Final diagnoses:  Seizures    63 year old male with history of CVA with residual left upper and lower extremity hemiparesis, hypertension, COPD, possible seizures who presents for an episode of "shaking" of the upper and  lower extremity. Patient remained conscious and talking throughout the episode. Episode consistent with prior episodes concerning for seizure. Patient prescribed Keppra but has not yet received it through the mail. No other recent illness. AF VSS.  Neurologic exam consistent with prior exams, no new neurologic deficits noted.  Concern for possible breakthrough seizures patient has not been on his. Will get electrolytes. No signs of infectious etiology. Episode not consistent with a new CVA, do not feel CT head indicated at this time. Will load with Keppra 1 g.  Patient witnessed to have a seizure-like episode in the emergency department lasting proximally 20 to 32nd with upper extremity and lower extremity tonic-clonic movements and left-sided tensing of the face. Patient alert and able to talk throughout the episode. No postictal period. Episode resolved prior to any intervention. Patient given Ativan 1 mg IV as he had not yet received the Keppra.  Electrolytes unremarkable patient returned to baseline in appropriate for discharge home with Neurologist  followup. Spoke with patient's cousin who is his caregiver. She will ensure that he gets his Keppra today. Return precautions were given.  Renaldo Reel, MD 12/11/13 [phone]

## 2013-12-11 NOTE — ED Notes (Signed)
Family called for transportation will come around 1800.

## 2013-12-12 DIAGNOSIS — N182 Chronic kidney disease, stage 2 (mild): Secondary | ICD-10-CM

## 2013-12-12 DIAGNOSIS — R5383 Other fatigue: Secondary | ICD-10-CM

## 2013-12-12 DIAGNOSIS — R569 Unspecified convulsions: Secondary | ICD-10-CM | POA: Diagnosis present

## 2013-12-12 DIAGNOSIS — R5381 Other malaise: Secondary | ICD-10-CM

## 2013-12-12 DIAGNOSIS — J449 Chronic obstructive pulmonary disease, unspecified: Secondary | ICD-10-CM

## 2013-12-12 DIAGNOSIS — G40802 Other epilepsy, not intractable, without status epilepticus: Secondary | ICD-10-CM

## 2013-12-12 DIAGNOSIS — I129 Hypertensive chronic kidney disease with stage 1 through stage 4 chronic kidney disease, or unspecified chronic kidney disease: Secondary | ICD-10-CM

## 2013-12-12 DIAGNOSIS — I69998 Other sequelae following unspecified cerebrovascular disease: Secondary | ICD-10-CM

## 2013-12-12 DIAGNOSIS — E876 Hypokalemia: Secondary | ICD-10-CM

## 2013-12-12 DIAGNOSIS — D638 Anemia in other chronic diseases classified elsewhere: Secondary | ICD-10-CM | POA: Diagnosis present

## 2013-12-12 DIAGNOSIS — E43 Unspecified severe protein-calorie malnutrition: Secondary | ICD-10-CM

## 2013-12-12 DIAGNOSIS — E785 Hyperlipidemia, unspecified: Secondary | ICD-10-CM

## 2013-12-12 DIAGNOSIS — K5900 Constipation, unspecified: Secondary | ICD-10-CM

## 2013-12-12 LAB — COMPREHENSIVE METABOLIC PANEL
ALT: 27 U/L (ref 0–53)
AST: 27 U/L (ref 0–37)
Albumin: 3.2 g/dL — ABNORMAL LOW (ref 3.5–5.2)
Alkaline Phosphatase: 52 U/L (ref 39–117)
BUN: 29 mg/dL — ABNORMAL HIGH (ref 6–23)
CALCIUM: 10.2 mg/dL (ref 8.4–10.5)
CO2: 25 meq/L (ref 19–32)
CREATININE: 1.34 mg/dL (ref 0.50–1.35)
Chloride: 99 mEq/L (ref 96–112)
GFR, EST AFRICAN AMERICAN: 63 mL/min — AB (ref >90)
GFR, EST NON AFRICAN AMERICAN: 54 mL/min — AB (ref >90)
GLUCOSE: 86 mg/dL (ref 70–99)
Potassium: 3.4 mEq/L — ABNORMAL LOW (ref 3.7–5.3)
Sodium: 137 mEq/L (ref 137–147)
Total Bilirubin: <0.2 mg/dL — ABNORMAL LOW (ref 0.3–1.2)
Total Protein: 7.4 g/dL (ref 6.0–8.3)

## 2013-12-12 LAB — IRON AND TIBC
Iron: 27 ug/dL — ABNORMAL LOW (ref 42–135)
SATURATION RATIOS: 8 % — AB (ref 20–55)
TIBC: 347 ug/dL (ref 215–435)
UIBC: 320 ug/dL (ref 125–400)

## 2013-12-12 LAB — RETICULOCYTES
RBC.: 2.41 MIL/uL — AB (ref 4.22–5.81)
Retic Count, Absolute: 31.3 10*3/uL (ref 19.0–186.0)
Retic Ct Pct: 1.3 % (ref 0.4–3.1)

## 2013-12-12 LAB — CBC WITH DIFFERENTIAL/PLATELET
Basophils Absolute: 0 10*3/uL (ref 0.0–0.1)
Basophils Relative: 0 % (ref 0–1)
EOS ABS: 0.1 10*3/uL (ref 0.0–0.7)
EOS PCT: 1 % (ref 0–5)
HEMATOCRIT: 22.6 % — AB (ref 39.0–52.0)
Hemoglobin: 7.4 g/dL — ABNORMAL LOW (ref 13.0–17.0)
LYMPHS ABS: 3 10*3/uL (ref 0.7–4.0)
LYMPHS PCT: 42 % (ref 12–46)
MCH: 27.4 pg (ref 26.0–34.0)
MCHC: 32.7 g/dL (ref 30.0–36.0)
MCV: 83.7 fL (ref 78.0–100.0)
MONO ABS: 0.7 10*3/uL (ref 0.1–1.0)
Monocytes Relative: 10 % (ref 3–12)
Neutro Abs: 3.4 10*3/uL (ref 1.7–7.7)
Neutrophils Relative %: 47 % (ref 43–77)
Platelets: 404 10*3/uL — ABNORMAL HIGH (ref 150–400)
RBC: 2.7 MIL/uL — AB (ref 4.22–5.81)
RDW: 13.9 % (ref 11.5–15.5)
WBC: 7.2 10*3/uL (ref 4.0–10.5)

## 2013-12-12 LAB — CBG MONITORING, ED: Glucose-Capillary: 87 mg/dL (ref 70–99)

## 2013-12-12 LAB — CBC
HCT: 20.8 % — ABNORMAL LOW (ref 39.0–52.0)
HEMATOCRIT: 20.8 % — AB (ref 39.0–52.0)
HEMOGLOBIN: 6.9 g/dL — AB (ref 13.0–17.0)
Hemoglobin: 6.7 g/dL — CL (ref 13.0–17.0)
MCH: 27.7 pg (ref 26.0–34.0)
MCH: 27.2 pg (ref 26.0–34.0)
MCHC: 33.2 g/dL (ref 30.0–36.0)
MCHC: 32.2 g/dL (ref 30.0–36.0)
MCV: 83.5 fL (ref 78.0–100.0)
MCV: 84.6 fL (ref 78.0–100.0)
Platelets: 376 10*3/uL (ref 150–400)
Platelets: 420 10*3/uL — ABNORMAL HIGH (ref 150–400)
RBC: 2.49 MIL/uL — AB (ref 4.22–5.81)
RBC: 2.46 MIL/uL — ABNORMAL LOW (ref 4.22–5.81)
RDW: 14 % (ref 11.5–15.5)
RDW: 14 % (ref 11.5–15.5)
WBC: 6.4 10*3/uL (ref 4.0–10.5)
WBC: 6.3 10*3/uL (ref 4.0–10.5)

## 2013-12-12 LAB — BASIC METABOLIC PANEL
BUN: 31 mg/dL — ABNORMAL HIGH (ref 6–23)
CO2: 24 mEq/L (ref 19–32)
CREATININE: 1.24 mg/dL (ref 0.50–1.35)
Calcium: 9.8 mg/dL (ref 8.4–10.5)
Chloride: 105 mEq/L (ref 96–112)
GFR calc Af Amer: 69 mL/min — ABNORMAL LOW (ref >90)
GFR calc non Af Amer: 60 mL/min — ABNORMAL LOW (ref >90)
GLUCOSE: 95 mg/dL (ref 70–99)
Potassium: 4.1 mEq/L (ref 3.7–5.3)
Sodium: 138 mEq/L (ref 137–147)

## 2013-12-12 LAB — FERRITIN: FERRITIN: 16 ng/mL — AB (ref 22–322)

## 2013-12-12 LAB — MAGNESIUM: Magnesium: 1.9 mg/dL (ref 1.5–2.5)

## 2013-12-12 LAB — PREPARE RBC (CROSSMATCH)

## 2013-12-12 LAB — VITAMIN B12: Vitamin B-12: 552 pg/mL (ref 211–911)

## 2013-12-12 LAB — URIC ACID: Uric Acid, Serum: 6.4 mg/dL (ref 4.0–7.8)

## 2013-12-12 LAB — FOLATE

## 2013-12-12 MED ORDER — FOLIC ACID 1 MG PO TABS
1.0000 mg | ORAL_TABLET | Freq: Every day | ORAL | Status: DC
Start: 2013-12-12 — End: 2013-12-15
  Administered 2013-12-12 – 2013-12-15 (×4): 1 mg via ORAL
  Filled 2013-12-12 (×4): qty 1

## 2013-12-12 MED ORDER — SODIUM CHLORIDE 0.9 % IV SOLN
1000.0000 mg | Freq: Once | INTRAVENOUS | Status: AC
  Administered 2013-12-12: 1000 mg via INTRAVENOUS
  Filled 2013-12-12: qty 10

## 2013-12-12 MED ORDER — POTASSIUM CHLORIDE CRYS ER 20 MEQ PO TBCR
40.0000 meq | EXTENDED_RELEASE_TABLET | Freq: Once | ORAL | Status: AC
  Administered 2013-12-12: 40 meq via ORAL
  Filled 2013-12-12: qty 2

## 2013-12-12 MED ORDER — POLYETHYLENE GLYCOL 3350 17 G PO PACK
17.0000 g | PACK | Freq: Every day | ORAL | Status: DC
  Administered 2013-12-12 – 2013-12-14 (×3): 17 g via ORAL
  Filled 2013-12-12 (×4): qty 1

## 2013-12-12 MED ORDER — LORAZEPAM 2 MG/ML IJ SOLN
1.0000 mg | INTRAMUSCULAR | Status: DC | PRN
  Administered 2013-12-12: 1 mg via INTRAVENOUS

## 2013-12-12 MED ORDER — SODIUM CHLORIDE 0.9 % IV SOLN
75.0000 mL/h | INTRAVENOUS | Status: DC
  Administered 2013-12-12: 75 mL/h via INTRAVENOUS

## 2013-12-12 MED ORDER — ACETAMINOPHEN 325 MG PO TABS
650.0000 mg | ORAL_TABLET | Freq: Four times a day (QID) | ORAL | Status: DC | PRN
  Administered 2013-12-12: 650 mg via ORAL
  Filled 2013-12-12: qty 2

## 2013-12-12 MED ORDER — ENSURE COMPLETE PO LIQD
237.0000 mL | Freq: Two times a day (BID) | ORAL | Status: DC
  Administered 2013-12-12 (×2): 237 mL via ORAL

## 2013-12-12 MED ORDER — LEVETIRACETAM 500 MG PO TABS
1000.0000 mg | ORAL_TABLET | Freq: Two times a day (BID) | ORAL | Status: DC
  Administered 2013-12-12 – 2013-12-13 (×3): 1000 mg via ORAL
  Filled 2013-12-12 (×5): qty 2

## 2013-12-12 MED ORDER — ADULT MULTIVITAMIN W/MINERALS CH
1.0000 | ORAL_TABLET | Freq: Every day | ORAL | Status: DC
  Administered 2013-12-12 – 2013-12-15 (×4): 1 via ORAL
  Filled 2013-12-12 (×4): qty 1

## 2013-12-12 MED ORDER — TIOTROPIUM BROMIDE MONOHYDRATE 18 MCG IN CAPS
18.0000 ug | ORAL_CAPSULE | Freq: Every day | RESPIRATORY_TRACT | Status: DC
  Administered 2013-12-14 – 2013-12-15 (×2): 18 ug via RESPIRATORY_TRACT
  Filled 2013-12-12 (×2): qty 5

## 2013-12-12 MED ORDER — CLOPIDOGREL BISULFATE 75 MG PO TABS
75.0000 mg | ORAL_TABLET | Freq: Every day | ORAL | Status: DC
  Administered 2013-12-12 – 2013-12-15 (×4): 75 mg via ORAL
  Filled 2013-12-12 (×5): qty 1

## 2013-12-12 MED ORDER — ENSURE COMPLETE PO LIQD
237.0000 mL | Freq: Three times a day (TID) | ORAL | Status: DC
  Administered 2013-12-12 – 2013-12-15 (×7): 237 mL via ORAL

## 2013-12-12 MED ORDER — CHLORHEXIDINE GLUCONATE 0.12 % MT SOLN
15.0000 mL | Freq: Two times a day (BID) | OROMUCOSAL | Status: DC
  Administered 2013-12-12 – 2013-12-15 (×5): 15 mL via OROMUCOSAL
  Filled 2013-12-12 (×10): qty 15

## 2013-12-12 MED ORDER — LEVETIRACETAM 500 MG PO TABS
500.0000 mg | ORAL_TABLET | Freq: Two times a day (BID) | ORAL | Status: DC
  Filled 2013-12-12 (×2): qty 1

## 2013-12-12 MED ORDER — ALBUTEROL SULFATE (2.5 MG/3ML) 0.083% IN NEBU: 3.0000 mL | INHALATION_SOLUTION | Freq: Four times a day (QID) | RESPIRATORY_TRACT | Status: DC | PRN

## 2013-12-12 MED ORDER — ENOXAPARIN SODIUM 40 MG/0.4ML ~~LOC~~ SOLN
40.0000 mg | SUBCUTANEOUS | Status: DC
  Filled 2013-12-12: qty 0.4

## 2013-12-12 MED ORDER — VITAMIN B-1 100 MG PO TABS
100.0000 mg | ORAL_TABLET | Freq: Every day | ORAL | Status: DC
  Administered 2013-12-12 – 2013-12-15 (×4): 100 mg via ORAL
  Filled 2013-12-12 (×4): qty 1

## 2013-12-12 MED ORDER — ATORVASTATIN CALCIUM 80 MG PO TABS
80.0000 mg | ORAL_TABLET | Freq: Every day | ORAL | Status: DC
  Administered 2013-12-12 – 2013-12-15 (×4): 80 mg via ORAL
  Filled 2013-12-12 (×4): qty 1

## 2013-12-12 MED ORDER — LORAZEPAM 2 MG/ML IJ SOLN
1.0000 mg | Freq: Once | INTRAMUSCULAR | Status: DC
  Filled 2013-12-12: qty 1

## 2013-12-12 NOTE — Progress Notes (Signed)
CRITICAL VALUE ALERT  Critical value received:  6.9  Date of notification:  12-12-13  Time of notification:  0915  Critical value read back:yes  Nurse who received alert:  Lind Covert, RN  MD notified (1st page):  Dr. Gordy Levan  Time of first page:  0920  MD notified (2nd page): n/a   Time of second page:  Responding MD:  Dr. Gordy Levan  Time MD responded:  [phone]

## 2013-12-12 NOTE — Progress Notes (Signed)
Patient had three seizures this afternoon. Two were observed by nurse and 1 by nurse tech. In all three cases the patient lost consciousness briefly (about 5-15 seconds). He a rhythmic motion of the arms and legs. He immediately regained consciousness and was alert oriented to [place] [facility] and time. Further he was able to describe why he was hospitalized.  Neuro exam was stable from admission physical exam.  The absence of a post-ictal state is concerning for possible pseudoseizure. However, with his seizure history I elected to treat the patient with 1 mg IV ativan and transfer to SDU.

## 2013-12-12 NOTE — H&P (Signed)
  Date: 12/12/2013  Patient name: [name]  Medical record number: [id number]  Date of birth: 05-Jul-1951   I have seen and evaluated [name] and discussed their care with the Residency Team. [name] had his first szs in April 2015 thought to be 2/2 to recent CVA. He was sent home on Keppra 500 BID but bc he uses a mail order pharmacy he was not able to obtain the med in a timely manner and never took it. He presented to ED on 5/1 for sz and was sent home and again on 5/4 AM and loaded with Keppra and sent home. He had another szs on 5/4 and is now admitted. He was loaded with Keppra 1 gm and neuro rec increasing Keppra to 1000 BID. He has had no further szs since admission.  PT has eval pt and rec CIR eval.   At his April D/C, CM noted that b/o medicaid, he had only on [facility] visit remaining.   On exam, he is alert and talkative. HRRR. LCTAB. ABD + BS/S/NT/ND. Ext no edema. Strength nl on R upper and lower ext and 1/5 on L.  HgB started decreasing 10/2010 and sig on 10/2013. Now 6.9. No iron studies. 10/2013 multiple polyps and diverticulosis.   Assessment and Plan: I have seen and evaluated the patient as outlined above. I agree with the formulated Assessment and Plan as detailed in the residents' admission note, with the following changes:   1. Seizure disorder - Agree that focus seems to be recent stroke. Pt has not been able to take the [place]. Has responded to Keppra loading. Will need to educate importance of [facility] and ensure he has it in his possession prior to D/C.   2. Anemia - Normocytic with recent nl colonoscopy. Check iron studies. Guaiac stools. Most likely not iron def and more likely 2/2 CKD.  3. S/P CVA with L sided weakness - CIR consult. Will need to work with CM in case pt is not CIR candidate - SNF vs home with PT/OT.  Bartholomew Crews, MD 5/5/20151:12 PM

## 2013-12-12 NOTE — Progress Notes (Signed)
Subjective:   Pt has no new complaints this AM.  Pt is feeling better and did not have any events overnight. VSS.  He has not had any seizures since admission and states he was not taking his keppra at home.  He also reports not having a BM in 3 days.  Also, his hgb has dropped to 6.9 this AM and wondering if this is dilutional?  He denies having any BRBPR, bloody stools, or hematuria.     Objective:   Vital signs in last 24 hours: Filed Vitals:   12/12/13 0330 12/12/13 0400 12/12/13 0430 12/12/13 0529  BP: 97/60 107/57 99/63 115/65  Pulse: 91 86 89 87  Temp:    98.2 F (36.8 C)  TempSrc:    Oral  Resp: 15 13 19 18   Height:    5\' 8"  (1.727 m)  Weight:    154 lb (69.854 kg)  SpO2: 100% 100% 99% 98%   Weight change:   Intake/Output Summary (Last 24 hours) at 12/12/13 1329 Last data filed at 12/12/13 0415  Gross per 24 hour  Intake      0 ml  Output    225 ml  Net   -225 ml    Physical Exam: Constitutional: He is oriented to person, place, and time. No distress.  HENT:  Head: Normocephalic and atraumatic.  Eyes: EOM are normal. Pupils are equal, round, and reactive to light.  Cardiovascular: Normal rate, regular rhythm, normal heart sounds and intact distal pulses.  Pulmonary/Chest: Effort normal and breath sounds normal. No respiratory distress. He has no wheezes.  Abdominal: Soft. Bowel sounds are normal. There is no tenderness. There is no rebound.  Musculoskeletal: He exhibits no edema.  ~4cm nodule on anterior left tibular head below patella  Neurological: He is alert and oriented to person, place, and time.  5/5 RUE, grip strength, and RLE strength 2-3/5 LUE and LLE strength, 3+/5 left grip strength. Decreased left shoulder shrug otherwise CN2-12 grossly intact  Skin: Skin is warm and dry.    Lab Results:  BMP:  Recent Labs Lab 12/12/13 0015 12/12/13 0815  NA 137 138  K 3.4* 4.1  CL 99 105  CO2 25 24  GLUCOSE 86 95  BUN 29* 31*  CREATININE 1.34  1.24  CALCIUM 10.2 9.8  MG  --  1.9   Anion Gap: 9   CBC:  Recent Labs Lab 12/12/13 0015 12/12/13 0815  WBC 7.2 6.4  NEUTROABS 3.4  --   HGB 7.4* 6.9*  HCT 22.6* 20.8*  MCV 83.7 83.5  PLT 404* 376    Coagulation: No results found for this basename: LABPROT, INR,  in the last 168 hours  CBG:            Recent Labs Lab 12/08/13 2151 12/11/13 1417 12/12/13 0008  GLUCAP 116* 86 87           HA1C:      No results found for this basename: HGBA1C,  in the last 168 hours  Lipid Panel: No results found for this basename: CHOL, HDL, LDLCALC, TRIG, CHOLHDL, LDLDIRECT,  in the last 168 hours  LFTs:  Recent Labs Lab 12/12/13 0015  AST 27  ALT 27  ALKPHOS 52  BILITOT <0.2*  PROT 7.4  ALBUMIN 3.2*    Pancreatic Enzymes: No results found for this basename: LIPASE, AMYLASE,  in the last 168 hours  Lactic Acid/Procalcitonin: No results found for this basename: LATICACIDVEN, PROCALCITON,  in the last  168 hours  Ammonia: No results found for this basename: AMMONIA,  in the last 168 hours  Cardiac Enzymes: No results found for this basename: CKTOTAL, CKMB, CKMBINDEX, TROPONINI,  in the last 168 hours Lab Results  Component Value Date   CKTOTAL 187 12/02/2013   CKMB 2.5 10/14/2010   TROPONINI <0.30 12/02/2013    EKG: EKG Interpretation  Date/Time:    Ventricular Rate:    PR Interval:    QRS Duration:   QT Interval:    QTC Calculation:   R Axis:     Text Interpretation:     BNP: No results found for this basename: PROBNP,  in the last 168 hours  D-Dimer: No results found for this basename: DDIMER,  in the last 168 hours  Urinalysis: No results found for this basename: COLORURINE, APPERANCEUR, LABSPEC, PHURINE, GLUCOSEU, HGBUR, BILIRUBINUR, KETONESUR, PROTEINUR, UROBILINOGEN, NITRITE, LEUKOCYTESUR,  in the last 168 hours  Micro Results: No results found for this or any previous visit (from the past 240 hour(s)).  Blood Culture:    Component Value  Date/Time   SDES URINE, CLEAN CATCH 10/25/2013 0411   SPECREQUEST Normal 10/25/2013 0411   CULT  Value: Multiple bacterial morphotypes present, none predominant. Suggest appropriate recollection if clinically indicated. Performed at [facility] 10/25/2013 0410   REPTSTATUS 10/25/2013 FINAL 10/25/2013 0411    Studies/Results: No results found.  Medications:  Scheduled Meds: . atorvastatin  80 mg Oral Daily  . chlorhexidine  15 mL Mouth/Throat BID  . clopidogrel  75 mg Oral Q breakfast  . feeding supplement (ENSURE COMPLETE)  237 mL Oral BID BM  . folic acid  1 mg Oral Daily  . levETIRAcetam  1,000 mg Oral BID  . multivitamin with minerals  1 tablet Oral Daily  . polyethylene glycol  17 g Oral Daily  . thiamine  100 mg Oral Daily  . tiotropium  18 mcg Inhalation Daily   Continuous Infusions: . sodium chloride 75 mL/hr (12/12/13 0534)   PRN Meds:.acetaminophen, albuterol, LORazepam  Antibiotics: Anti-infectives   None     Antibiotics Given (last 72 hours)   None      Day of Hospitalization:  1 Consults: Treatment Team:  Catarina Hartshorn, MD  Assessment/Plan:   Principal Problem:   Seizures Active Problems:   Hypertension   COPD   Cerebral embolism with cerebral infarction   Dyslipidemia   Alcohol abuse   Localized swelling, mass, or lump of lower extremity   Left-sided weakness, residual s/p cva   Hypokalemia   Protein-calorie malnutrition, severe   Anemia, chronic disease   Seizure  Seizures  Resolved.  Pt without additional seizures since hospitalization.  Most likely d/t non-compliance with meds.  Neurology following and increased keppra to 1g keppra bid.  - Seizure precautions  - Ativan PRN  - Keppra 1000mg  BID  - Neurology consulted, appreciate recs  Possible acute on chronic anemia  Hgb 7.4 today inline with last discharge.  However, dropped to 6.9 this AM.  Also, BUN elevated, possible GIB?  Denies any CP, SOB.  Pt denies any BRBPR, bloody  stools, or hematuria.  Pt denies any h/o blood transfusions.  Pt had colonoscopy on 10/20/13 with tubular adenomas.  Pt denies any previous EGD.  -repeat cbc -UA -FOBT -d/c lovenox -?d/c plavix; ask neurology about this in setting of possible acute bleed? -Consider GI consult outpatient?  CKD Stage 2  Patient with baseline Cr of ~1. On discharge his Cr was 1.07 today his Cr  is 1.34, he has a mildly elevated BUN--GIB? or component of prerenal AKI - IVF NS at 75cc/hr   Hypertension  BP low normal on admission.  - Hold home antihypertensive medications  - Monitor closely as has history of malignant hypertension   COPD  Stable. No wheezing or dyspnea  -Continue Albuterol PRN  -Continue Spiriva   Dyslipidemia  -Continue Atorvastatin   Hypokalemia  Pt K+ 3.4.  - KDur 30mEq x1   Protein-calorie malnutrition, severe  -Consider nutrition consult in AM   Constipation  Pt without BM for the past 3 days -miralax qd  #VTE PPx  SCDs  #Dispo- Disposition is deferred at this time, awaiting improvement of current medical problems.  Anticipated discharge in approximately 1-2 day(s).    LOS: 1 day   Jones Bales, MD PGY-1, Internal Medicine Teaching Service [phone] (7AM-5PM Mon-Fri) 12/12/2013, 1:29 PM

## 2013-12-12 NOTE — ED Notes (Signed)
Pt reports pain to L leg. Leg repositioned.

## 2013-12-12 NOTE — Consult Note (Signed)
Neurology Consultation Reason for Consult: Seizures Referring Physician: Lita Mains, D  CC: Seizures  History is obtained from:Patient, medical record  HPI: [name] is a 63 y.o. male with a history of seizures following a previous stroke who presents with multiple seizures today. He is very somnolent and unable to give me much of a history. He has not taken his Keppra per chart and therefore has had multiple seizures at home. He came in today, and had a gram IV of Keppra, despite this had multiple seizures at home.  Unfortunately there is known to give a history of description of the spells from today, and the patient will not provide this.  ROS: Not able to be assessed due to somnolence  Past Medical History  Diagnosis Date  . Hypertension 05/18/2007  . COPD 05/18/2007  . Elevated PSA 05/25/2007  . 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 Stroke 12/05/2012  . Prostate cancer     Family History: Mother heart disease  Social History: Tob: Current smoker  Exam: Current vital signs: BP 97/60  Pulse 91  Temp(Src) 98.5 F (36.9 C) (Oral)  Resp 15  SpO2 100% Vital signs in last 24 hours: Temp:  [97.5 F (36.4 C)-98.5 F (36.9 C)] 98.5 F (36.9 C) (05/04 2312) Pulse Rate:  [84-94] 91 (05/05 0330) Resp:  [0-26] 15 (05/05 0330) BP: (80-119)/(51-69) 97/60 mmHg (05/05 0330) SpO2:  [98 %-100 %] 100 % (05/05 0330) Weight:  [69.4 kg (153 lb)] 69.4 kg (153 lb) (05/04 1407)  General: In bed, in asleep  CV: Regular rate and rhythm  Mental Status: Patient is very somnolent, sleeping on arrival., Should this is due to 2 postictal state and how much is do to my 3 AM timing of encounter is unclear. He does arouse and is able to tell me month and year and give brief descriptions of some of the history of the day. He is not very cooperative.  Cranial Nerves: II: Visual Fields are full. Pupils are equal, round, and reactive to light.  Discs are difficult to  visualize. III,IV, VI: EOMI without ptosis or diploplia.  V: Facial sensation is decreased on the left VII: Facial movement is Notable for left droop  VIII: hearing is intact to voice X: Uvula elevates symmetrically XI: Shoulder shrug is symmetric. XII: tongue is midline without atrophy or fasciculations.  Motor: Tone is normal. Bulk is normal. 5/5 strength was present on the right, 1-2/5 weakness on the left  Sensory: Sensation is decreased throughout the left side  Deep Tendon Reflexes: 2+ and symmetric in the biceps and patellae.  Cerebellar: Does not cooperate Gait: Not tested due to patient safety concerns    I have reviewed labs in epic and the results pertinent to this consultation are: BMP-unremarkable CBG  normal  Impression: 63 year old male with a history of seizures and multiple strokes presenting with breakthrough seizures in the setting of medication noncompliance. He did have a single dose Earlier today with continued seizures. I would favor going up on the dose in this setting.  Recommendations: 1) Keppra 1 g twice a day 2) we'll continue to follow   Roland Rack, MD [facility] [phone]  If 7pm- 7am, please page neurology on call as listed in [place].

## 2013-12-12 NOTE — Progress Notes (Signed)
Patient had one seizure, observed by this RN, lasted about 15-25 seconds, 1mg  Lorazepam IV given. VSS, patient remained free from injury. Patient alert and oriented. Will continue to monitor.

## 2013-12-12 NOTE — Care Management Note (Unsigned)
    Page 1 of 1   12/12/2013     3:07:12 PM CARE MANAGEMENT NOTE 12/12/2013  Patient:  [name], [name]   Account Number:  [id number]  Date Initiated:  12/12/2013  Documentation initiated by:  GRAVES-BIGELOW,Dario Yono  Subjective/Objective Assessment:   Pt admitted with seizures. Initiated on IV Keppra. Per MD notes pt was noncompliant with medication management.  CIR consult placed to evaluate pt for disposition needs.     Action/Plan:   CM will continue to monitor.   Anticipated DC Date:  12/14/2013   Anticipated DC Plan:  IP REHAB FACILITY      DC Planning Services  CM consult      Choice offered to / List presented to:             Status of service:  In process, will continue to follow Medicare Important Message given?  NO (If response is "NO", the following Medicare IM given date fields will be blank) Date Medicare IM given:   Date Additional Medicare IM given:    Discharge Disposition:    Per UR Regulation:  Reviewed for med. necessity/level of care/duration of stay  If discussed at [place] of Stay Meetings, dates discussed:    Comments:

## 2013-12-12 NOTE — Progress Notes (Signed)
Two more seizures within the last hour. Patient to receive blood. VSS. Request made to Dr. Thomasene Lot to move patient to step down. Physician to come see patient, evaluate and transfer. Will continue to monitor. Tonita Cong , RN

## 2013-12-12 NOTE — Progress Notes (Addendum)
Pt transferred to step down with belongings. Family at bedside. Report given by day shift nurse to [facility] nurse.

## 2013-12-12 NOTE — H&P (Signed)
Date: 12/12/2013               Patient Name:  [name] MRN: [id number]  DOB: April 24, 1951 Age / Sex: 63 y.o., male   PCP: Corky Sox, MD         Medical Service: Internal Medicine Teaching Service         Attending Physician: Dr. Larey Dresser, MD    First Contact: Dr. Michail Jewels Pager: [phone]  Second Contact: Dr. Karlyn Agee Pager: [phone]       After Hours (After 5p/  First Contact Pager: [phone]  weekends / holidays): Second Contact Pager: [phone]   Chief Complaint: seizures  History of Present Illness: [name] is a 63 yo AA male with PMH of recent R ACA stroke (March 2015), HTN, COPD, and prostate cancer (s/p resection, follows with Dr Janice Norrie) who was recently discharged from the hospital on 12/05/13 after workup for new onset seizures.  At that time he was started on Keppra 500mg  BID and discharged home to live with cousin [name]).  [name] is present with him today and reports since his return home he has had 6 episodes of seizure like activity.  She brought him to the ED to be evaluated on 12/08/13 and then again on 12/11/13 he was discharged home from the ED on both of these occasions.  On 12/11/13 he was loaded with 1g of IV Keppra before leaving as he has not taken his Keppra since hospital discharge as he gets his medications from a mail order pharmacy.  Despite the IV load of Keppra [name] notes that after he got home he had a another seizure like episodes which she reports is he extends his arms and legs and becomes stiff, he is fully conscience and can speak to her during the episode.  The episodes usually last from 30s to 2 mins and after which he is weaker and more fatigued.  He has no associated bowel or bladder dysfunction, no tongue biting.   Meds: No current facility-administered medications for this encounter.   Current Outpatient Prescriptions  Medication Sig Dispense Refill  . albuterol (PROVENTIL HFA;VENTOLIN HFA) 108 (90 BASE) MCG/ACT  inhaler Inhale 2 puffs into the lungs every 6 (six) hours as needed for wheezing.  8.5 g  3  . atorvastatin (LIPITOR) 80 MG tablet Take 80 mg by mouth daily.      . chlorhexidine (PERIDEX) 0.12 % solution Use as directed 15 mLs in the mouth or throat 2 (two) times daily. Use after breakfast and at bedtime:  Rinse and spit out excess, do not swallow      . clopidogrel (PLAVIX) 75 MG tablet Take 1 tablet (75 mg total) by mouth daily with breakfast.  90 tablet  0  . feeding supplement, ENSURE COMPLETE, (ENSURE COMPLETE) LIQD Take 237 mLs by mouth 2 (two) times daily between meals.      . hydrochlorothiazide (HYDRODIURIL) 25 MG tablet Take 1 tablet (25 mg total) by mouth daily.  30 tablet  5  . lisinopril (PRINIVIL,ZESTRIL) 10 MG tablet Take 1 tablet (10 mg total) by mouth daily.  30 tablet  2  . Multiple Vitamin (MULTIVITAMIN WITH MINERALS) TABS tablet Take 1 tablet by mouth daily.      Marland Kitchen tiotropium (SPIRIVA HANDIHALER) 18 MCG inhalation capsule Place 1 capsule (18 mcg total) into inhaler and inhale daily.  30 capsule  5  . levETIRAcetam (KEPPRA) 500 MG tablet Take 1 tablet (500 mg total) by mouth  2 (two) times daily.  60 tablet  1    Allergies: Allergies as of 12/11/2013 - Review Complete 12/11/2013  Allergen Reaction Noted  . Aspirin Hives 10/18/2012  . Penicillins Hives 10/18/2012   Past Medical History  Diagnosis Date  . Hypertension 05/18/2007  . COPD 05/18/2007  . Elevated PSA 05/25/2007  . 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 Stroke 12/05/2012  . Prostate cancer    Past Surgical History  Procedure Laterality Date  . 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 Multiple extractions with alveoloplasty N/A 10/23/2013    Procedure: Extraction of tooth #'s 1,2,3,4,5,6,7,11,12,13,14,15,16,20,21,22,23,24,27,28,29,32 with alveoloplasty and bilateral maxillary tuberosity reductions;  Surgeon: Lenn Cal, DDS;  Location: [facility];  Service: Oral Surgery;  Laterality: N/A;  . Leg  surgery      pin  right leg   Family History  Problem Relation Age of Onset  . Liver disease Mother   . Heart attack Mother 10  . Colon cancer Neg Hx    History   Social History  . Marital Status: Single    Spouse Name: N/A    Number of Children: 0  . Years of Education: N/A   Occupational History  . Flooring instalation     Retired   Social History Main Topics  . Smoking status: Current Some Day Smoker -- 0.40 packs/day for 50 years    Types: Cigarettes  . Smokeless tobacco: Never Used     Comment: 1/2 ppd hx, now 3-4 cigs/day  . Alcohol Use: No     Comment: 1 beer a week, used to drink 2-3 beers a day  . Drug Use: No     Comment: history of cocaine use  . Sexual Activity: Not Currently   Other Topics Concern  . Not on file   Social History Narrative   Drinks 1/2 pt of wine daily and beer daily (at least 12 oz)   Denies recreational drugs    Smokes 3-4 cigarettes. 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    2 years of college at [employer] per patient           Review of Systems: Review of Systems  Constitutional: Negative for fever and chills.  HENT: Negative for sore throat.   Eyes: Negative for blurred vision, double vision and photophobia.  Respiratory: Negative for cough and shortness of breath.   Cardiovascular: Negative for chest pain.  Gastrointestinal: Negative for abdominal pain and diarrhea.  Genitourinary: Negative for dysuria, frequency and hematuria.  Musculoskeletal: Negative for myalgias.  Neurological: Positive for seizures and weakness. Negative for dizziness, sensory change, focal weakness (no new), loss of consciousness and headaches.  Psychiatric/Behavioral: Negative for substance abuse.     Physical Exam: Blood pressure 92/62, pulse 93, temperature 98.5 F (36.9 C), temperature source Oral, resp. rate 20, SpO2 99.00%. Physical Exam  Nursing note and vitals reviewed. Constitutional: He  is oriented to person, place, and time. No distress.  Thin  HENT:  Head: Normocephalic and atraumatic.  Eyes: EOM are normal. Pupils are equal, round, and reactive to light.  Cardiovascular: Normal rate, regular rhythm, normal heart sounds and intact distal pulses.   Pulmonary/Chest: Effort normal and breath sounds normal. No respiratory distress. He has no wheezes.  Abdominal: Soft. Bowel sounds are normal. There is no tenderness. There is no rebound.  Musculoskeletal: He exhibits no edema.  ~4cm nodule on anterior left  tibular head below patella  Neurological: He is alert and oriented to person, place, and time.  5/5 RUE, grip strength, and RLE strength 2-3/5 LUE and LLE strength, 3+/5 left grip strength. Decreased left shoulder shrug otherwise CN2-12 grossly intact  Skin: Skin is warm and dry.     Lab results: Basic Metabolic Panel:  Recent Labs  12/11/13 1447 12/12/13 0015  NA 135* 137  K 3.7 3.4*  CL 98 99  CO2 23 25  GLUCOSE 101* 86  BUN 27* 29*  CREATININE 1.25 1.34  CALCIUM 10.1 10.2   Liver Function Tests:  Recent Labs  12/12/13 0015  AST 27  Pittman 27  ALKPHOS 52  BILITOT <0.2*  PROT 7.4  ALBUMIN 3.2*   CBC:  Recent Labs  12/12/13 0015  WBC 7.2  NEUTROABS 3.4  HGB 7.4*  HCT 22.6*  MCV 83.7  PLT 404*   CBG:  Recent Labs  12/11/13 1417 12/12/13 0008  GLUCAP 86 87   Urine Drug Screen: Drugs of Abuse     Component Value Date/Time   LABOPIA NONE DETECTED 12/02/2013 1935   LABOPIA NEGATIVE 10/14/2010 2140   COCAINSCRNUR NONE DETECTED 12/02/2013 1935   COCAINSCRNUR  Value: POSITIVE (NOTE) Result repeated and verified. Sent for confirmatory testing* 10/14/2010 2140   LABBENZ NONE DETECTED 12/02/2013 1935   LABBENZ NEGATIVE 10/14/2010 2140   AMPHETMU NONE DETECTED 12/02/2013 1935   AMPHETMU NEGATIVE 10/14/2010 2140   THCU NONE DETECTED 12/02/2013 1935   LABBARB NONE DETECTED 12/02/2013 1935     Imaging results:  No results found.  Other  results: EKG: sinus rhythm with 1st degree AV block, rate 85, normal axis, QTc of 483 roughly unchanged from previous tracings.  Assessment & Plan by Problem:   Seizures - Patient seen by neurology on last hospitalization, workup at that time suspected his recent CVA as the focus of his seizure activity although no eliptiform activity was caught on EEG monitoring.  Keppra daily had been recommended and patient has not been compliant with this medication, however he was given 1g of IV keppra in ED earlier today and still had seizure like activity. - Admit to telemetry (inpatient) - Seizure precautions - Ativan PRN - Keppra 500mg  BID - Consult neurology  CKD Stage 2 - Patient with baseline Cr of ~1. On discharge his Cr was 1.07 today his Cr is 1.34, he has a mildly elevated BUN, there may be a component of prerenal AKI - IVF NS at 75cc/hr    Hypertension - BP low normal on admission - Hold home antihypertensive medications - Monitor closely as has history of malignant hypertension    COPD -No wheezing or dyspnea -Continue Albuterol PRN - COntinue Spiriva    Dyslipidemia - Continue Atorvastatin    Left-sided weakness, residual s/p cva - Inconsistent exam however appears to be roughly at baseline - PT/OT consult in AM    Hypokalemia -K+ 3.4 - KDur 64mEq x1    Protein-calorie malnutrition, severe -Consider nutrition consult in AM    Anemia, chronic disease -Hgb 7.4 today inline with last discharge. - Consider anemia panel  Diet: Heart DVT PPx: Lovenox Code Status: Full Dispo: Disposition is deferred at this time, awaiting improvement of current medical problems. Anticipated discharge in approximately 2 day(s).   The patient does have a current PCP Corky Sox, MD) and does need an [facility] hospital follow-up appointment after discharge.  The patient does not have transportation limitations that hinder transportation to clinic appointments.  Signed:  Joni Reining,  DO 12/12/2013, 2:13 AM

## 2013-12-12 NOTE — Evaluation (Signed)
Physical Therapy Evaluation Patient Details Name: [name] MRN: [id number] DOB: Feb 20, 1951 Today's Date: 12/12/2013   History of Present Illness  63 y.o. male [facility] on 5/4 with seizures.  Of note, he was recently d/c from [facility] on 12/05/13 and 11/24/13 and this admission is his 5th admission this year.  Pt with significant PMhx of HTN, COPD, stroke (with some mild left sided weakness), COPD, right leg pinning, and right toe amputation.    Clinical Impression  Pt with significant decline in function over the last 3 weeks and even in the last week since last hospital admission and P.T. Note in epic. Pt and cousin report he has cousin and sitter to assist at home but with continued physical decline he will need additional assist and therapy in order to return home. Pt with below deficits who will benefit from acute therapy to maximize mobility, strength, function, safety and transfers to decrease burden of care and increase independence. Pt encouraged to continue HEP daily as well as bed to chair transfers with nursing staff.     Follow Up Recommendations CIR;Supervision/Assistance - 24 hour    Equipment Recommendations  None recommended by PT    Recommendations for Other Services Rehab consult;OT consult     Precautions / Restrictions Precautions Precautions: Fall Precaution Comments: left hemiparesis      Mobility  Bed Mobility Overal bed mobility: Needs Assistance Bed Mobility: Rolling;Sidelying to Sit Rolling: Supervision Sidelying to sit: Min assist       General bed mobility comments: mod assist with pad to scoot to EOB  Transfers Overall transfer level: Needs assistance   Transfers: Squat Pivot Transfers Sit to Stand: Mod assist   Squat pivot transfers: Max assist     General transfer comment: pt with max assist to pivot to right bed to chair with use of armrests and cues for sequence. Standing from chair x 2 with armrests mod assist with LLE blocked and  assist for anterior translation with pt utilizing RUE but unable to push through LUE  Ambulation/Gait Ambulation/Gait assistance:  (pt unable)              Stairs            Wheelchair Mobility    Modified Rankin (Stroke Patients Only) Modified Rankin (Stroke Patients Only) Pre-Morbid Rankin Score: Moderately severe disability Modified Rankin: Severe disability     Balance Overall balance assessment: Needs assistance Sitting-balance support: Feet supported;Single extremity supported Sitting balance-Leahy Scale: Poor   Postural control: Posterior lean;Left lateral lean   Standing balance-Leahy Scale: Zero                               Pertinent Vitals/Pain No pain HR 79    Home Living Family/patient expects to be discharged to:: Private residence Living Arrangements: Non-relatives/Friends Available Help at Discharge: Family;Available 24 hours/day;Personal care attendant Type of Home: House Home Access: Stairs to enter Entrance Stairs-Rails: None Entrance Stairs-Number of Steps: 1 Home Layout: One level Home Equipment: Walker - 2 wheels;Cane - single point;Shower seat;Wheelchair - manual Additional Comments: Pt was living at home with roommate until 3 wks ago when he moved in with cousin Ai. pt with progressive decline in strength and function since that time.    Prior Function Level of Independence: Needs assistance   Gait / Transfers Assistance Needed: Pt had been able to walk 30' with RW mod I but for grossly a week has  been needing assist to transfer and using WC. after each seizure episode pt with progressing weakness per cousin  ADL's / Homemaking Assistance Needed: Pt and cousin state pt was having assist to get to shower chair then bathing himself but this has progressively declined to needing assist over the last week        Hand Dominance        Extremity/Trunk Assessment           LUE Deficits / Details: RUE grossly  4-/5 . LUE pt unable to lift fully against gravity more than grossly 60degrees with 2-/5 strength   Lower Extremity Assessment: RLE deficits/detail RLE Deficits / Details: pt with 4/5 hip flexion, knee flexion and extension LLE Deficits / Details: hip flexion 2+/5, knee extension 2-/5, knee flexion 2/5. pt with significantly decreased strength LLE from last P.T. eval and functionally as well per pt  Cervical / Trunk Assessment: Normal  Communication   Communication: Expressive difficulties  Cognition Arousal/Alertness: Awake/alert Behavior During Therapy: WFL for tasks assessed/performed Overall Cognitive Status: Impaired/Different from baseline Area of Impairment: Attention;Following commands;Problem solving;Safety/judgement;Orientation Orientation Level: Time Current Attention Level: Focused Memory: Decreased short-term memory Following Commands: Follows one step commands with increased time Safety/Judgement: Decreased awareness of deficits   Problem Solving: Slow processing;Requires verbal cues;Requires tactile cues      General Comments      Exercises General Exercises - Lower Extremity Long Arc Quad: AROM;Both;10 reps;Seated Hip Flexion/Marching: AROM;Both;10 reps;Seated      Assessment/Plan    PT Assessment Patient needs continued PT services  PT Diagnosis Difficulty walking;Abnormality of gait;Generalized weakness;Hemiplegia non-dominant side   PT Problem List Decreased strength;Decreased activity tolerance;Decreased balance;Decreased mobility;Decreased coordination;Decreased cognition;Decreased knowledge of use of DME;Decreased safety awareness;Decreased knowledge of precautions  PT Treatment Interventions DME instruction;Gait training;Stair training;Functional mobility training;Therapeutic activities;Therapeutic exercise;Balance training;Neuromuscular re-education;Cognitive remediation;Patient/family education;Wheelchair mobility training   PT Goals (Current goals can  be found in the Care Plan section) Acute Rehab PT Goals Patient Stated Goal: get stronger so I can do PT Goal Formulation: With patient Time For Goal Achievement: 12/26/13 Potential to Achieve Goals: Fair    Frequency Min 3X/week   Barriers to discharge Decreased caregiver support      Co-evaluation               End of Session Equipment Utilized During Treatment: Gait belt Activity Tolerance: Patient limited by fatigue Patient left: in chair;with call bell/phone within reach;with chair alarm set Nurse Communication: Mobility status         Time: 0932-6712 PT Time Calculation (min): 18 min   Charges:   PT Evaluation $Initial PT Evaluation Tier I: 1 Procedure PT Treatments $Therapeutic Activity: 8-22 mins   PT G Codes:          Lailanie Hasley B Courtlyn Aki 12/12/2013, 12:16 PM Elwyn Reach, [place]

## 2013-12-12 NOTE — Progress Notes (Signed)
Patient assisted back to bed by this nurse and nurse tech. Incontinent episode in chair before transfer. Patient assisted to lie down when seizure activity ensued with shaking in upper extremities for about 25-30 seconds. Patient given support and vital signs checked. Alert and oriented after seizure. No other symptoms. Physician notified. VSS. Will continue to monitor safety. Tonita Cong, RN

## 2013-12-12 NOTE — Progress Notes (Signed)
MEDICATION RELATED CONSULT NOTE - INITIAL   Pharmacy Consult for Keppra  Indication: Seizures   Allergies  Allergen Reactions  . Aspirin Hives  . Penicillins Hives  . Other Other (See Comments)    Epidural Other(seizure)    Patient Measurements: Height: 5\' 8"  (172.7 cm) Weight: 154 lb (69.854 kg) IBW/kg (Calculated) : 68.4 Adjusted Body Weight: n/a  Vital Signs: Temp: 98.2 F (36.8 C) (05/05 0529) Temp src: Oral (05/05 0529) BP: 115/65 mmHg (05/05 0529) Pulse Rate: 87 (05/05 0529) Intake/Output from previous day: 05/04 0701 - 05/05 0700 In: -  Out: 225 [Urine:225] Intake/Output from this shift:    Labs:  Recent Labs  12/11/13 1447 12/12/13 0015 12/12/13 0815  WBC  --  7.2 6.4  HGB  --  7.4* 6.9*  HCT  --  22.6* 20.8*  PLT  --  404* 376  CREATININE 1.25 1.34 1.24  MG  --   --  1.9  ALBUMIN  --  3.2*  --   PROT  --  7.4  --   AST  --  27  --   ALT  --  27  --   ALKPHOS  --  52  --   BILITOT  --  <0.2*  --    Estimated Creatinine Clearance: 58.2 ml/min (by C-G formula based on Cr of 1.24).   Microbiology: No results found for this or any previous visit (from the past 720 hour(s)).  Medical History: Past Medical History  Diagnosis Date  . Hypertension 05/18/2007  . COPD 05/18/2007  . Elevated PSA 05/25/2007  . 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 Stroke 12/05/2012  . Prostate cancer     Medications:  Prescriptions prior to admission  Medication Sig Dispense Refill  . albuterol (PROVENTIL HFA;VENTOLIN HFA) 108 (90 BASE) MCG/ACT inhaler Inhale 2 puffs into the lungs every 6 (six) hours as needed for wheezing.  8.5 g  3  . atorvastatin (LIPITOR) 80 MG tablet Take 80 mg by mouth daily.      . chlorhexidine (PERIDEX) 0.12 % solution Use as directed 15 mLs in the mouth or throat 2 (two) times daily. Use after breakfast and at bedtime:  Rinse and spit out excess, do not swallow      . clopidogrel (PLAVIX) 75 MG tablet Take 1 tablet (75 mg  total) by mouth daily with breakfast.  90 tablet  0  . feeding supplement, ENSURE COMPLETE, (ENSURE COMPLETE) LIQD Take 237 mLs by mouth 2 (two) times daily between meals.      . hydrochlorothiazide (HYDRODIURIL) 25 MG tablet Take 1 tablet (25 mg total) by mouth daily.  30 tablet  5  . lisinopril (PRINIVIL,ZESTRIL) 10 MG tablet Take 1 tablet (10 mg total) by mouth daily.  30 tablet  2  . Multiple Vitamin (MULTIVITAMIN WITH MINERALS) TABS tablet Take 1 tablet by mouth daily.      Marland Kitchen tiotropium (SPIRIVA HANDIHALER) 18 MCG inhalation capsule Place 1 capsule (18 mcg total) into inhaler and inhale daily.  30 capsule  5  . levETIRAcetam (KEPPRA) 500 MG tablet Take 1 tablet (500 mg total) by mouth 2 (two) times daily.  60 tablet  1    Assessment: 68 YOF who presented with multiple seizures due to non-compliance with home keppra. She was on keppra 500 mg twice daily at home. She received a bolus dose of Keppra 1000 mg IV x 1 dose. Neurology was consulted who increased Keppra dose to 1000 mg twice  daily. No drug interactions identified.  Goal of Therapy:  Prevention of seizures   Plan:  1) Continue Keppra 1000 mg by mouth daily per Neuro recommendations 2) Monitor for s/s of seizures   Albertina Parr, PharmD.  Clinical Pharmacist Pager [phone]

## 2013-12-12 NOTE — Progress Notes (Signed)
Rehab Admissions Coordinator Note:  Patient was screened by Cleatrice Burke for appropriateness for an Inpatient Acute Rehab Consult. Per PT recommendation.  At this time, we are recommending Inpatient Rehab consult. Please place order.  Audelia Acton [facility] 12/12/2013, 2:30 PM  I can be reached at [phone].

## 2013-12-12 NOTE — Progress Notes (Signed)
INITIAL NUTRITION ASSESSMENT  DOCUMENTATION CODES Per approved criteria  -Severe malnutrition in the context of acute illness or injury   Pt meets criteria for severe MALNUTRITION in the context of acute illness/injury as evidenced by 11% weight loss x 2 months and moderate depletion of muscle mass.   INTERVENTION: Downgrade diet to Dysphagia 3  Increase Ensure Complete to TID, each supplement providing 350 kcals and 13 grams of protein   NUTRITION DIAGNOSIS: Biting/Chewing (Masticatory) Difficulty related to poor dentition as evidenced by pt report of decreased estimated intake.   Goal: Meet >/=90% of estimated nutrition needs  Monitor:  PO intake, supplement acceptance, weight trend, labs   Reason for Assessment: Positive Malnutrition Screening Tool Score   63 y.o. male  Admitting Dx: Seizures  ASSESSMENT: Patient is a 63 y.o. Male presenting with seizures. PMHx of HTN, COPD, prostate cancer, stroke 11/2012.   Pt reports poor appetite and poor dentition. Pt states that he has been eating 50-75 % for the last several months. Currently during admissions, EPIC chart reports pt is eating 100% of meals.   Pt states he has lost weight in the last 2-3 months. States that he used to weigh 186 lbs. Per EPIC chart, pt has lost 11% body weight in 2 months.   Patient likes to drink Ensure and at time of assessment had consumed two for the day. Pt requested another Ensure and dietetic intern provided this for the pt.   Pt stated that he has trouble chewing and this is a reason for poor energy intake.   Diet recall:  Breakfast: waffles Lunch: chili Dinner: spaghetti/meatballs, chicken & dumplings   Nutrition Focused Physical Exam:  Subcutaneous Fat:  Orbital Region: wnl Upper Arm Region: wnl Thoracic and Lumbar Region: wnl  Muscle:  Temple Region: wnl Clavicle Bone Region: moderate depletion  Clavicle and Acromion Bone Region: moderate depletion Scapular Bone Region: moderate  depletion  Dorsal Hand: wnl Patellar Region: mild depletion Anterior Thigh Region: mild depletion  Posterior Calf Region: mild depletion   Edema: none    Height: Ht Readings from Last 1 Encounters:  12/12/13 5\' 8"  (1.727 m)    Weight: Wt Readings from Last 1 Encounters:  12/12/13 154 lb (69.854 kg)    Ideal Body Weight: 70 kg   % Ideal Body Weight: 100%   Wt Readings from Last 10 Encounters:  12/12/13 154 lb (69.854 kg)  12/11/13 153 lb (69.4 kg)  12/01/13 153 lb 3.2 oz (69.491 kg)  11/23/13 153 lb 6.4 oz (69.582 kg)  11/02/13 163 lb (73.936 kg)  10/27/13 169 lb 15.6 oz (77.1 kg)  10/23/13 173 lb 15.1 oz (78.9 kg)  10/23/13 173 lb 15.1 oz (78.9 kg)  10/20/13 173 lb (78.472 kg)  10/16/13 173 lb 6.4 oz (78.654 kg)    Usual Body Weight: 186 lbs   % Usual Body Weight: 83%   BMI:  Body mass index is 23.42 kg/(m^2)., Normal   Estimated Nutritional Needs: Kcal: 1900 - 2100 Protein: 85 - 100 g  Fluid: 1.9 - 2.1 L/day   Skin: WDL  Diet Order: Cardiac  EDUCATION NEEDS: -No education needs identified at this time   Intake/Output Summary (Last 24 hours) at 12/12/13 1550 Last data filed at 12/12/13 0415  Gross per 24 hour  Intake      0 ml  Output    225 ml  Net   -225 ml    Last BM: 5/2    Labs:   Recent Labs Lab 12/11/13  1447 12/12/13 0015 12/12/13 0815  NA 135* 137 138  K 3.7 3.4* 4.1  CL 98 99 105  CO2 23 25 24   BUN 27* 29* 31*  CREATININE 1.25 1.34 1.24  CALCIUM 10.1 10.2 9.8  MG  --   --  1.9  GLUCOSE 101* 86 95    CBG (last 3)   Recent Labs  12/11/13 1417 12/12/13 0008  GLUCAP 86 87    Scheduled Meds: . atorvastatin  80 mg Oral Daily  . chlorhexidine  15 mL Mouth/Throat BID  . clopidogrel  75 mg Oral Q breakfast  . feeding supplement (ENSURE COMPLETE)  237 mL Oral BID BM  . folic acid  1 mg Oral Daily  . levETIRAcetam  1,000 mg Oral BID  . multivitamin with minerals  1 tablet Oral Daily  . polyethylene glycol  17 g Oral  Daily  . thiamine  100 mg Oral Daily  . tiotropium  18 mcg Inhalation Daily    Continuous Infusions: . sodium chloride 75 mL/hr (12/12/13 0534)    Past Medical History  Diagnosis Date  . Hypertension 05/18/2007  . COPD 05/18/2007  . Elevated PSA 05/25/2007  . 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 Stroke 12/05/2012  . Prostate cancer     Past Surgical History  Procedure Laterality Date  . 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 Multiple extractions with alveoloplasty N/A 10/23/2013    Procedure: Extraction of tooth #'s 1,2,3,4,5,6,7,11,12,13,14,15,16,20,21,22,23,24,27,28,29,32 with alveoloplasty and bilateral maxillary tuberosity reductions;  Surgeon: Lenn Cal, DDS;  Location: [facility];  Service: Oral Surgery;  Laterality: N/A;  . Leg surgery      pin  right leg    Carrolyn Leigh, BS Dietetic Intern Pager: [phone]  I agree with student dietitian note; appropriate revisions have been made.  Molli Barrows, RD, LDN, [place] Pager# [phone] After Hours Pager# [phone]

## 2013-12-12 NOTE — Consult Note (Signed)
Physical Medicine and Rehabilitation Consult Reason for Consult: New onset seizures in setting of Recent CVA.  Referring Physician:  Dr. Lynnae Pittman.     HPI: [name] is a 63 y.o. male  63 yo AA male with PMH of R-ACA CVA with left hemiparesis (11/2012 CIR to SNF), HTN, COPD, and prostate cancer with thoracic mets who was recently discharged from the hospital on 12/05/13 after workup for new onset seizures. At that time he was started on Keppra 500mg  BID and discharged home to live with cousin [name]). He was evaluated in ED on 12/08/13 and then again on 12/11/13 with recurrent seizures. On 12/11/13 he was loaded with 1g of IV Keppra before leaving as he has not taken his Keppra since hospital discharge as he is awaiting his medications from a mail order pharmacy. He was readmitted last pm with additional seizures. Neurology recommends Keppra 1 gram twice a day for breakthrough seizures in the setting of medication noncompliance. PT evaluation done and CIR recommended for follow up therapy as patient with continued decline at home.   Patient known from prior rehabilitation stay approximately one year ago. 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 Last office note from June 2014 showed 5/5 strength on the left side with the exception of 4/5 deltoid. Had G-tube secondary to severe dysphagiawhich was removed last summer after recovery of swallow function  Reviewed neurology note  Review of Systems  Respiratory: Positive for cough and shortness of breath.   Gastrointestinal: Positive for constipation.  Neurological: Positive for seizures and weakness.    Past Medical History  Diagnosis Date  . Hypertension 05/18/2007  . COPD 05/18/2007  . Elevated PSA 05/25/2007  . 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 Stroke 12/05/2012  . Prostate cancer    Past Surgical History  Procedure Laterality Date  . 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 Multiple  extractions with alveoloplasty N/A 10/23/2013    Procedure: Extraction of tooth #'s 1,2,3,4,5,6,7,11,12,13,14,15,16,20,21,22,23,24,27,28,29,32 with alveoloplasty and bilateral maxillary tuberosity reductions;  Surgeon: Lenn Cal, DDS;  Location: [facility];  Service: Oral Surgery;  Laterality: N/A;  . Leg surgery      pin  right leg   Family History  Problem Relation Age of Onset  . Liver disease Mother   . Heart attack Mother 43  . Colon cancer Neg Hx    Social History:  Lives with cousin. Cousin works days and he stays in his wheelchair most of the day--is able to transfer to couch. Per reports that he has been smoking Cigarettes.  He has a 20 pack-year smoking history. He has never used smokeless tobacco. He reports that he does not drink alcohol or use illicit drugs.   Allergies  Allergen Reactions  . Aspirin Hives  . Penicillins Hives  . Other Other (See Comments)    Epidural Other(seizure)   Medications Prior to Admission  Medication Sig Dispense Refill  . albuterol (PROVENTIL HFA;VENTOLIN HFA) 108 (90 BASE) MCG/ACT inhaler Inhale 2 puffs into the lungs every 6 (six) hours as needed for wheezing.  8.5 g  3  . atorvastatin (LIPITOR) 80 MG tablet Take 80 mg by mouth daily.      . chlorhexidine (PERIDEX) 0.12 % solution Use as directed 15 mLs in the mouth or throat 2 (two) times daily. Use after breakfast and at bedtime:  Rinse and spit out excess, do not swallow      .  clopidogrel (PLAVIX) 75 MG tablet Take 1 tablet (75 mg total) by mouth daily with breakfast.  90 tablet  0  . feeding supplement, ENSURE COMPLETE, (ENSURE COMPLETE) LIQD Take 237 mLs by mouth 2 (two) times daily between meals.      . hydrochlorothiazide (HYDRODIURIL) 25 MG tablet Take 1 tablet (25 mg total) by mouth daily.  30 tablet  5  . lisinopril (PRINIVIL,ZESTRIL) 10 MG tablet Take 1 tablet (10 mg total) by mouth daily.  30 tablet  2  . Multiple Vitamin (MULTIVITAMIN WITH MINERALS) TABS tablet Take 1 tablet by  mouth daily.      Marland Kitchen tiotropium (SPIRIVA HANDIHALER) 18 MCG inhalation capsule Place 1 capsule (18 mcg total) into inhaler and inhale daily.  30 capsule  5  . levETIRAcetam (KEPPRA) 500 MG tablet Take 1 tablet (500 mg total) by mouth 2 (two) times daily.  60 tablet  1    Home: Home Living Family/patient expects to be discharged to:: Private residence Living Arrangements: Non-relatives/Friends Available Help at Discharge: Family;Available 24 hours/day;Personal care attendant Type of Home: House Home Access: Stairs to enter [employer] of Steps: 1 Entrance Stairs-Rails: None Home Layout: One level Home Equipment: Walker - 2 wheels;Cane - single point;Shower seat;Wheelchair - manual Additional Comments: Pt was living at home with roommate until 3 wks ago when he moved in with cousin [name]. pt with progressive decline in strength and function since that time.  Functional History: Prior Function Level of Independence: Needs assistance Gait / Transfers Assistance Needed: Pt had been able to walk 30' with RW mod I but for grossly a week has been needing assist to transfer and using WC. after each seizure episode pt with progressing weakness per cousin ADL's / Homemaking Assistance Needed: Pt and cousin state pt was having assist to get to shower chair then bathing himself but this has progressively declined to needing assist over the last week Communication / Swallowing Assistance Needed: Pt has no teeth, they were all extracted earlier this year.   Functional Status:  Mobility: Bed Mobility Overal bed mobility: Needs Assistance Bed Mobility: Rolling;Sidelying to Sit Rolling: Supervision Sidelying to sit: Min assist General bed mobility comments: mod assist with pad to scoot to EOB Transfers Overall transfer level: Needs assistance Transfers: Squat Pivot Transfers Sit to Stand: Mod assist Squat pivot transfers: Max assist General transfer comment: pt with max assist to  pivot to right bed to chair with use of armrests and cues for sequence. Standing from chair x 2 with armrests mod assist with LLE blocked and assist for anterior translation with pt utilizing RUE but unable to push through LUE Ambulation/Gait Ambulation/Gait assistance:  (pt unable)    ADL:    Cognition: Cognition Overall Cognitive Status: Impaired/Different from baseline Orientation Level: Oriented X4 Cognition Arousal/Alertness: Awake/alert Behavior During Therapy: WFL for tasks assessed/performed Overall Cognitive Status: Impaired/Different from baseline Area of Impairment: Attention;Following commands;Problem solving;Safety/judgement;Orientation Orientation Level: Time Current Attention Level: Focused Memory: Decreased short-term memory Following Commands: Follows one step commands with increased time Safety/Judgement: Decreased awareness of deficits Problem Solving: Slow processing;Requires verbal cues;Requires tactile cues  Blood pressure 115/65, pulse 87, temperature 98.2 F (36.8 C), temperature source Oral, resp. rate 18, height 5\' 8"  (1.727 m), weight 69.854 kg (154 lb), SpO2 98.00%. Physical Exam  Nursing note and vitals reviewed. Constitutional: He is oriented to person, place, and time. He appears well-developed and well-nourished.  HENT:  Head: Normocephalic and atraumatic.  Eyes: Pupils are equal, round, and reactive to light.  Right conjunctiva is injected. Left conjunctiva is injected.  Neck: Normal range of motion. Neck supple.  Cardiovascular: Normal rate and regular rhythm.   Respiratory: Effort normal and breath sounds normal.  GI: Soft. Bowel sounds are normal.  Musculoskeletal:  Large soft mass left knee.   Neurological: He is alert and oriented to person, place, and time.  Soft voice. Follows basic commands without difficulty. Left hemiparesis.   Skin: Skin is warm and dry.   speech with mild dysarthria Motor strength is 5/5 in the right deltoid,  bicep, tricep, grip, hip flexor, knee extensors, ankle dorsiflexor plantar flexor Trace left deltoid biceps and triceps 3 minus grip with poor release Right lower extremity 5/5 hip flexor, knee extensors, ankle dorsiflexor plantar flexor Left hip flexor 2-/5, 0/5 left knee extensor 0/5 ankle dorsiflexor plantar flexor Tone is increased in the left hamstring Sensation reduced left lower extremity, intact left upper extremity to light touch  Results for orders placed during the hospital encounter of 12/11/13 (from the past 24 hour(s))  CBG MONITORING, ED     Status: None   Collection Time    12/12/13 12:08 AM      Result Value Ref Range   Glucose-Capillary 87  70 - 99 mg/dL  CBC WITH DIFFERENTIAL     Status: Abnormal   Collection Time    12/12/13 12:15 AM      Result Value Ref Range   WBC 7.2  4.0 - 10.5 K/uL   RBC 2.70 (*) 4.22 - 5.81 MIL/uL   Hemoglobin 7.4 (*) 13.0 - 17.0 g/dL   HCT 22.6 (*) 39.0 - 52.0 %   MCV 83.7  78.0 - 100.0 fL   MCH 27.4  26.0 - 34.0 pg   MCHC 32.7  30.0 - 36.0 g/dL   RDW 13.9  11.5 - 15.5 %   Platelets 404 (*) 150 - 400 K/uL   Neutrophils Relative % 47  43 - 77 %   Neutro Abs 3.4  1.7 - 7.7 K/uL   Lymphocytes Relative 42  12 - 46 %   Lymphs Abs 3.0  0.7 - 4.0 K/uL   Monocytes Relative 10  3 - 12 %   Monocytes Absolute 0.7  0.1 - 1.0 K/uL   Eosinophils Relative 1  0 - 5 %   Eosinophils Absolute 0.1  0.0 - 0.7 K/uL   Basophils Relative 0  0 - 1 %   Basophils Absolute 0.0  0.0 - 0.1 K/uL  COMPREHENSIVE METABOLIC PANEL     Status: Abnormal   Collection Time    12/12/13 12:15 AM      Result Value Ref Range   Sodium 137  137 - 147 mEq/L   Potassium 3.4 (*) 3.7 - 5.3 mEq/L   Chloride 99  96 - 112 mEq/L   CO2 25  19 - 32 mEq/L   Glucose, Bld 86  70 - 99 mg/dL   BUN 29 (*) 6 - 23 mg/dL   Creatinine, Ser 1.34  0.50 - 1.35 mg/dL   Calcium 10.2  8.4 - 10.5 mg/dL   Total Protein 7.4  6.0 - 8.3 g/dL   Albumin 3.2 (*) 3.5 - 5.2 g/dL   AST 27  0 - 37 U/L    Pittman 27  0 - 53 U/L   Alkaline Phosphatase 52  39 - 117 U/L   Total Bilirubin <0.2 (*) 0.3 - 1.2 mg/dL   GFR calc non Af Amer 54 (*) >90 mL/min   GFR  calc Af Amer 63 (*) >90 mL/min  CBC     Status: Abnormal   Collection Time    12/12/13  8:15 AM      Result Value Ref Range   WBC 6.4  4.0 - 10.5 K/uL   RBC 2.49 (*) 4.22 - 5.81 MIL/uL   Hemoglobin 6.9 (*) 13.0 - 17.0 g/dL   HCT 69.6 (*) 78.9 - 38.1 %   MCV 83.5  78.0 - 100.0 fL   MCH 27.7  26.0 - 34.0 pg   MCHC 33.2  30.0 - 36.0 g/dL   RDW 01.7  51.0 - 25.8 %   Platelets 376  150 - 400 K/uL  BASIC METABOLIC PANEL     Status: Abnormal   Collection Time    12/12/13  8:15 AM      Result Value Ref Range   Sodium 138  137 - 147 mEq/L   Potassium 4.1  3.7 - 5.3 mEq/L   Chloride 105  96 - 112 mEq/L   CO2 24  19 - 32 mEq/L   Glucose, Bld 95  70 - 99 mg/dL   BUN 31 (*) 6 - 23 mg/dL   Creatinine, Ser 5.27  0.50 - 1.35 mg/dL   Calcium 9.8  8.4 - 78.2 mg/dL   GFR calc non Af Amer 60 (*) >90 mL/min   GFR calc Af Amer 69 (*) >90 mL/min  MAGNESIUM     Status: None   Collection Time    12/12/13  8:15 AM      Result Value Ref Range   Magnesium 1.9  1.5 - 2.5 mg/dL   No results found.  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 Does the need for close, 24 hr/day medical supervision in concert with the patient's rehab needs make it unreasonable for this patient to be served in a less intensive setting? Potentially 2. 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 Post stroke seizure disorder uncontrolled 3. Due to bladder management, bowel management, safety, skin/wound care, disease management, medication administration and patient education, does the patient require 24 hr/day rehab nursing? Potentially 4. Does the patient require coordinated care of a physician, rehab nurse, PT (1-2 hrs/day,  5 days/week), OT (1-2 hrs/day, 5 days/week) and SLP (0.5-1 hrs/day, 5 days/week) to address physical and functional deficits in the context of the above medical diagnosis(es)? Potentially Addressing deficits in the following areas: balance, endurance, locomotion, strength, transferring, bowel/bladder control, bathing, dressing, toileting and speech 5. Can the patient actively participate in an intensive therapy program of at least 3 hrs of therapy per day at least 5 days per week? Potentially 6. The potential for patient to make measurable gains while on inpatient rehab is fair 7. Anticipated functional outcomes upon discharge from inpatient rehab are min assist  with PT, min assist with OT, supervision with SLP. 8. Estimated rehab length of stay to reach the above functional goals is: 2 weeks to reduce burden of care 9. Does the patient have adequate social supports to accommodate these discharge functional goals? No 10. Anticipated D/C setting: Other 11. Anticipated post D/C treatments: SNF therapy 12. Overall Rehab/Functional Prognosis: fair  RECOMMENDATIONS: This patient's condition is appropriate for continued rehabilitative care in the following setting: CIR once seizures are controlled Patient has agreed to participate in recommended program. Potentially Note that insurance prior authorization may be required for reimbursement for recommended care.  Comment: Need to clarify whether  denosumab needs to be initiated in hospital, Dr Janice Norrie is urologist Pt does not have 24/7  Care at home    12/12/2013

## 2013-12-12 NOTE — Addendum Note (Signed)
Addended by: Hulan Fray on: 12/12/2013 06:55 PM   Modules accepted: Orders

## 2013-12-12 NOTE — ED Provider Notes (Signed)
CSN: [id number]     Arrival date & time 12/11/13  2302 History   First MD Initiated Contact with Patient 12/11/13 2310     Chief Complaint  Patient presents with  . Seizures     (Consider location/radiation/quality/duration/timing/severity/associated sxs/prior Treatment) HPI Patient has had a recent stroke within the last year to the hand with partial left-sided paralysis. He was admitted to the hospital 4/25 for new and onset seizure-like activity. Patient was started on Keppra at that time. Since then he's been seen in emergency department twice for persistent seizure-like activity. It is described as shaking in the bilateral upper and lower extremities. The patient does not lose consciousness and has no post ictal phase with this. It lasts roughly 20-30 seconds. The patient has had several episodes tonight one witnessed in the emergency department. He was seen yesterday for this and noted with Keppra. He has persistent left-sided weakness that is unchanged. He has persistent dysphagia which is unchanged. Past Medical History  Diagnosis Date  . Hypertension 05/18/2007  . COPD 05/18/2007  . Elevated PSA 05/25/2007  . 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 Stroke 12/05/2012  . Prostate cancer    Past Surgical History  Procedure Laterality Date  . 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 Multiple extractions with alveoloplasty N/A 10/23/2013    Procedure: Extraction of tooth #'s 1,2,3,4,5,6,7,11,12,13,14,15,16,20,21,22,23,24,27,28,29,32 with alveoloplasty and bilateral maxillary tuberosity reductions;  Surgeon: Lenn Cal, DDS;  Location: [facility];  Service: Oral Surgery;  Laterality: N/A;  . Leg surgery      pin  right leg   Family History  Problem Relation Age of Onset  . Liver disease Mother   . Heart attack Mother 55  . Colon cancer Neg Hx    History  Substance Use Topics  . Smoking status: Current Some Day Smoker -- 0.40 packs/day for 50 years    Types: Cigarettes   . Smokeless tobacco: Never Used     Comment: 1/2 ppd hx, now 3-4 cigs/day  . Alcohol Use: No     Comment: 1 beer a week, used to drink 2-3 beers a day    Review of Systems  Constitutional: Negative for fever and chills.  Respiratory: Negative for shortness of breath.   Cardiovascular: Negative for chest pain.  Gastrointestinal: Negative for nausea, vomiting and abdominal pain.  Musculoskeletal: Negative for back pain, neck pain and neck stiffness.  Skin: Negative for rash.  Neurological: Positive for seizures and weakness. Negative for headaches.  All other systems reviewed and are negative.     Allergies  Aspirin; Penicillins; and Other  Home Medications   Prior to Admission medications   Medication Sig Start Date End Date Taking? Authorizing Provider  albuterol (PROVENTIL HFA;VENTOLIN HFA) 108 (90 BASE) MCG/ACT inhaler Inhale 2 puffs into the lungs every 6 (six) hours as needed for wheezing. 10/10/13  Yes Corky Sox, MD  atorvastatin (LIPITOR) 80 MG tablet Take 80 mg by mouth daily.   Yes Historical Provider, MD  chlorhexidine (PERIDEX) 0.12 % solution Use as directed 15 mLs in the mouth or throat 2 (two) times daily. Use after breakfast and at bedtime:  Rinse and spit out excess, do not swallow   Yes Historical Provider, MD  clopidogrel (PLAVIX) 75 MG tablet Take 1 tablet (75 mg total) by mouth daily with breakfast. 12/01/13  Yes Cresenciano Genre, MD  feeding supplement, ENSURE COMPLETE, (ENSURE COMPLETE) LIQD Take 237 mLs by mouth 2 (two)  times daily between meals. 12/05/13  Yes Rebecca Eaton, MD  hydrochlorothiazide (HYDRODIURIL) 25 MG tablet Take 1 tablet (25 mg total) by mouth daily. 11/09/13  Yes Cresenciano Genre, MD  lisinopril (PRINIVIL,ZESTRIL) 10 MG tablet Take 1 tablet (10 mg total) by mouth daily. 12/01/13  Yes Cresenciano Genre, MD  Multiple Vitamin (MULTIVITAMIN WITH MINERALS) TABS tablet Take 1 tablet by mouth daily.   Yes Historical Provider, MD  tiotropium (SPIRIVA  HANDIHALER) 18 MCG inhalation capsule Place 1 capsule (18 mcg total) into inhaler and inhale daily. 10/10/13 10/10/14 Yes Corky Sox, MD  levETIRAcetam (KEPPRA) 500 MG tablet Take 1 tablet (500 mg total) by mouth 2 (two) times daily. 12/11/13   Renaldo Reel, MD   BP 92/62  Pulse 92  Temp(Src) 98.5 F (36.9 C) (Oral)  SpO2 99% Physical Exam  Nursing note and vitals reviewed. Constitutional: He is oriented to person, place, and time. He appears well-developed and well-nourished. No distress.  HENT:  Head: Normocephalic and atraumatic.  Mouth/Throat: Oropharynx is clear and moist.  Eyes: EOM are normal. Pupils are equal, round, and reactive to light.  Neck: Normal range of motion. Neck supple.  No posterior neck pain.  Cardiovascular: Normal rate and regular rhythm.   Pulmonary/Chest: Effort normal and breath sounds normal. No respiratory distress. He has no wheezes. He has no rales.  Abdominal: Soft. Bowel sounds are normal. He exhibits no distension and no mass. There is no tenderness. There is no rebound and no guarding.  Musculoskeletal: Normal range of motion. He exhibits no edema and no tenderness.  Neurological: He is alert and oriented to person, place, and time.  Slurred speech. Left upper and lower extremity weakness. Currently no seizure-like activity.  Skin: Skin is warm and dry. No rash noted. No erythema.  Psychiatric: He has a normal mood and affect. His behavior is normal.    ED Course  Procedures (including critical care time) Labs Review Labs Reviewed  CBC WITH DIFFERENTIAL - Abnormal; Notable for the following:    RBC 2.70 (*)    Hemoglobin 7.4 (*)    HCT 22.6 (*)    Platelets 404 (*)    All other components within normal limits  COMPREHENSIVE METABOLIC PANEL - Abnormal; Notable for the following:    Potassium 3.4 (*)    BUN 29 (*)    Albumin 3.2 (*)    Total Bilirubin <0.2 (*)    GFR calc non Af Amer 54 (*)    GFR calc Af Amer 63 (*)    All other components  within normal limits  CBG MONITORING, ED    Imaging Review No results found.   EKG Interpretation None      Date: 12/12/2013  Rate: 85  Rhythm: normal sinus rhythm  QRS Axis: normal  Intervals: normal  ST/T Wave abnormalities: normal  Conduction Disutrbances:none  Narrative Interpretation:   Old EKG Reviewed: none available   MDM   Final diagnoses:  None   Will consult neurology for persistent seizure-like activity despite loading with Keppra.   Spoke with Dr. Leonel Ramsay. Will evaluate patient in emergency department. Advises admission for persistent seizures.  Discuss with teaching service. Will admit.  Julianne Rice, MD 12/12/13 [phone]

## 2013-12-12 NOTE — Progress Notes (Signed)
UR Completed Max Romano Graves-Bigelow, RN,BSN [phone]  

## 2013-12-13 ENCOUNTER — Encounter (HOSPITAL_COMMUNITY): Payer: Self-pay | Admitting: General Practice

## 2013-12-13 DIAGNOSIS — I10 Essential (primary) hypertension: Secondary | ICD-10-CM

## 2013-12-13 DIAGNOSIS — D509 Iron deficiency anemia, unspecified: Secondary | ICD-10-CM

## 2013-12-13 DIAGNOSIS — I6330 Cerebral infarction due to thrombosis of unspecified cerebral artery: Secondary | ICD-10-CM

## 2013-12-13 LAB — BASIC METABOLIC PANEL
BUN: 27 mg/dL — ABNORMAL HIGH (ref 6–23)
CO2: 24 mEq/L (ref 19–32)
Calcium: 9.7 mg/dL (ref 8.4–10.5)
Chloride: 108 mEq/L (ref 96–112)
Creatinine, Ser: 0.94 mg/dL (ref 0.50–1.35)
GFR calc Af Amer: >90 mL/min (ref >90)
GFR calc non Af Amer: 86 mL/min — ABNORMAL LOW (ref >90)
Glucose, Bld: 98 mg/dL (ref 70–99)
Potassium: 4.2 mEq/L (ref 3.7–5.3)
Sodium: 141 mEq/L (ref 137–147)

## 2013-12-13 LAB — CBC
HCT: 26.6 % — ABNORMAL LOW (ref 39.0–52.0)
Hemoglobin: 8.7 g/dL — ABNORMAL LOW (ref 13.0–17.0)
MCH: 27.3 pg (ref 26.0–34.0)
MCHC: 32.7 g/dL (ref 30.0–36.0)
MCV: 83.4 fL (ref 78.0–100.0)
PLATELETS: 358 10*3/uL (ref 150–400)
RBC: 3.19 MIL/uL — AB (ref 4.22–5.81)
RDW: 14.4 % (ref 11.5–15.5)
WBC: 8.2 10*3/uL (ref 4.0–10.5)

## 2013-12-13 LAB — ABO/RH: ABO/RH(D): B POS

## 2013-12-13 MED ORDER — SODIUM CHLORIDE 0.9 % IV SOLN
500.0000 mg | Freq: Once | INTRAVENOUS | Status: AC
  Administered 2013-12-13: 500 mg via INTRAVENOUS
  Filled 2013-12-13: qty 5

## 2013-12-13 MED ORDER — LEVETIRACETAM 750 MG PO TABS
1500.0000 mg | ORAL_TABLET | Freq: Two times a day (BID) | ORAL | Status: DC
  Administered 2013-12-13 – 2013-12-15 (×4): 1500 mg via ORAL
  Filled 2013-12-13 (×6): qty 2

## 2013-12-13 MED ORDER — DOCUSATE SODIUM 100 MG PO CAPS
100.0000 mg | ORAL_CAPSULE | Freq: Two times a day (BID) | ORAL | Status: DC
  Administered 2013-12-13 – 2013-12-15 (×5): 100 mg via ORAL
  Filled 2013-12-13 (×7): qty 1

## 2013-12-13 MED ORDER — POLYSACCHARIDE IRON COMPLEX 150 MG PO CAPS
150.0000 mg | ORAL_CAPSULE | Freq: Every day | ORAL | Status: DC
  Administered 2013-12-13 – 2013-12-15 (×3): 150 mg via ORAL
  Filled 2013-12-13 (×3): qty 1

## 2013-12-13 NOTE — Progress Notes (Addendum)
Subjective:   Pt apparently had 3 seizures yesterday afternoon (two observed by RN and 1 by NT).  In all cases, pt lost consciousness for a few seconds and had a rhythmic motion of arms and legs.  He immediately regained consciousness and was alert and oriented to person and time.  He was transferred to SDU and given ativan PRN.  In SDU, apparently he had an additional seizure lasting 15-25 seconds-1mg  ativan was given, VSS.  This AM, pt has not new complaints except for some upper left leg pain that he gets when he lies on it for a period of time.  He is eating and sleeping well.     Objective:   Vital signs in last 24 hours: Filed Vitals:   12/13/13 0400 12/13/13 0741 12/13/13 0900 12/13/13 1250  BP: 141/91 139/78 165/86 139/80  Pulse: 78 80 77 79  Temp:  97.7 F (36.5 C)  98 F (36.7 C)  TempSrc:  Oral  Oral  Resp: 21 27 28 19   Height:      Weight:      SpO2: 96% 100% 99% 100%   Weight change: 13 lb 1.7 oz (5.946 kg)  Intake/Output Summary (Last 24 hours) at 12/13/13 1341 Last data filed at 12/13/13 1253  Gross per 24 hour  Intake   1192 ml  Output    750 ml  Net    442 ml    Physical Exam: Constitutional: He is oriented to person, place, and time. No distress.  HENT:  Head: Normocephalic and atraumatic.  Eyes: EOM are normal. Pupils are equal, round, and reactive to light.  Cardiovascular: Normal rate, regular rhythm, normal heart sounds and intact distal pulses.  Pulmonary/Chest: Effort normal and breath sounds normal. No respiratory distress. He has no wheezes.  Abdominal: Soft. Bowel sounds are normal. There is no tenderness. There is no rebound.  Musculoskeletal: He exhibits no edema.  ~4cm nodule on anterior left tibular head below patella  Neurological: He is alert and oriented to person, place, and time.  5/5 RUE, grip strength, and RLE strength 2-3/5 LUE and LLE strength, 3+/5 left grip strength. Decreased left shoulder shrug otherwise CN2-12 grossly  intact  Skin: Skin is warm and dry.    Lab Results:  BMP:  Recent Labs Lab 12/12/13 0815 12/13/13 0535  NA 138 141  K 4.1 4.2  CL 105 108  CO2 24 24  GLUCOSE 95 98  BUN 31* 27*  CREATININE 1.24 0.94  CALCIUM 9.8 9.7  MG 1.9  --    Anion Gap: 9   CBC:  Recent Labs Lab 12/12/13 0015  12/12/13 1456 12/13/13 0535  WBC 7.2  < > 6.3 8.2  NEUTROABS 3.4  --   --   --   HGB 7.4*  < > 6.7* 8.7*  HCT 22.6*  < > 20.8* 26.6*  MCV 83.7  < > 84.6 83.4  PLT 404*  < > 420* 358  < > = values in this interval not displayed.  Coagulation: No results found for this basename: LABPROT, INR,  in the last 168 hours  CBG:            Recent Labs Lab 12/08/13 2151 12/11/13 1417 12/12/13 0008  GLUCAP 116* 86 87           HA1C:      No results found for this basename: HGBA1C,  in the last 168 hours  Lipid Panel: No results found for this basename: CHOL,  HDL, LDLCALC, TRIG, CHOLHDL, LDLDIRECT,  in the last 168 hours  LFTs:  Recent Labs Lab 12/12/13 0015  AST 27  ALT 27  ALKPHOS 52  BILITOT <0.2*  PROT 7.4  ALBUMIN 3.2*    Pancreatic Enzymes: No results found for this basename: LIPASE, AMYLASE,  in the last 168 hours  Lactic Acid/Procalcitonin: No results found for this basename: LATICACIDVEN, PROCALCITON,  in the last 168 hours  Ammonia: No results found for this basename: AMMONIA,  in the last 168 hours  Cardiac Enzymes: No results found for this basename: CKTOTAL, CKMB, CKMBINDEX, TROPONINI,  in the last 168 hours Lab Results  Component Value Date   CKTOTAL 187 12/02/2013   CKMB 2.5 10/14/2010   TROPONINI <0.30 12/02/2013    EKG: EKG Interpretation  Date/Time:    Ventricular Rate:    PR Interval:    QRS Duration:   QT Interval:    QTC Calculation:   R Axis:     Text Interpretation:     BNP: No results found for this basename: PROBNP,  in the last 168 hours  D-Dimer: No results found for this basename: DDIMER,  in the last 168  hours  Urinalysis: No results found for this basename: COLORURINE, APPERANCEUR, LABSPEC, PHURINE, GLUCOSEU, HGBUR, BILIRUBINUR, KETONESUR, PROTEINUR, UROBILINOGEN, NITRITE, LEUKOCYTESUR,  in the last 168 hours  Micro Results: No results found for this or any previous visit (from the past 240 hour(s)).  Blood Culture:    Component Value Date/Time   SDES URINE, CLEAN CATCH 10/25/2013 0411   SPECREQUEST Normal 10/25/2013 0411   CULT  Value: Multiple bacterial morphotypes present, none predominant. Suggest appropriate recollection if clinically indicated. Performed at [facility] 10/25/2013 0410   REPTSTATUS 10/25/2013 FINAL 10/25/2013 0411    Studies/Results: No results found.  Medications:  Scheduled Meds: . atorvastatin  80 mg Oral Daily  . chlorhexidine  15 mL Mouth/Throat BID  . clopidogrel  75 mg Oral Q breakfast  . feeding supplement (ENSURE COMPLETE)  237 mL Oral TID WC  . folic acid  1 mg Oral Daily  . levETIRAcetam  1,500 mg Oral BID  . LORazepam  1 mg Intravenous Once  . multivitamin with minerals  1 tablet Oral Daily  . polyethylene glycol  17 g Oral Daily  . thiamine  100 mg Oral Daily  . tiotropium  18 mcg Inhalation Daily   Continuous Infusions:   PRN Meds:.acetaminophen, albuterol, LORazepam  Antibiotics: Anti-infectives   None     Antibiotics Given (last 72 hours)   None      Day of Hospitalization:  2 Consults: Treatment Team:  Catarina Hartshorn, MD  Assessment/Plan:   Principal Problem:   Seizures Active Problems:   Hypertension   COPD   Cerebral embolism with cerebral infarction   Dyslipidemia   Alcohol abuse   Localized swelling, mass, or lump of lower extremity   Left-sided weakness, residual s/p cva   Hypokalemia   Protein-calorie malnutrition, severe   Anemia, chronic disease   HTN (hypertension)   Anemia, iron deficiency  Seizures  Pt continues to have seizures and was transferred to SDU yesterday.  Neurology following.    Most likely d/t non-compliance with meds?  However, would seem this is less likely given that pt continues to have seizures during hospitalization.  Neurology increased keppra to 1g keppra bid yesterday.  12/04/13 EEG: Normal awake and drowsy EEG.  Electrolytes normal.  Does not appear to be on any meds that may lower the  seizure threshold.  He denies having any aura associated with his seizures and reports he just "starts shaking."  Could this be pseudoseizures? - Seizure precautions  - Ativan PRN  - Keppra 1000mg  BID  - Neurology consulted, appreciate recs ADDENDUM: Neurology increased Keppra to 1500 mg bid starting today  Possible acute on chronic anemia  Pt hgb 7.4 on admission, but dropped to 6.9 yesterday.  Also, BUN elevated, possible GIB?  Denies any CP, SOB.  Pt denies any BRBPR, bloody stools, or hematuria.  Pt denies any h/o blood transfusions.  Pt had colonoscopy on 10/20/13 with tubular adenomas.  Pt denies any previous EGD.  He was transfused 2 units of prbcs-hgb today is 8.7.  Anemia panel revealed low iron (27)  and ferritin (16). -UA -FOBT -d/c lovenox -Consider GI consult outpatient? -begin niferex  CKD Stage 2  Pt with baseline Cr of ~1.  Creatinine elevated upon admission, but decreased today to 0.94.  He continues to have a mildly elevated BUN--GIB? Unlikely prerenal cause for elevation in BUN since cre has come down.   - IVF NS at 75cc/hr   Hypertension  Stable. BP low normal on admission; goal BP<150/90. - Hold home antihypertensive medications  - Monitor closely as has history of malignant hypertension   COPD  Stable. No wheezing or dyspnea.  Saturating 100% on RA.  -Continue Albuterol PRN  -Continue Spiriva   Dyslipidemia  -Continue Atorvastatin   Hypokalemia  Resolved.   Protein-calorie malnutrition, severe  -appreciate nutrition recs  Constipation  Pt without BM for the past 3 days -miralax qd  #VTE PPx  SCDs  #Dispo- Disposition is deferred at  this time, awaiting improvement of current medical problems.  Hopefully will be able to transfer out of SDU tomorrow.  Anticipated discharge in approximately 1-2 day(s).    LOS: 2 days   Jones Bales, MD PGY-1, Internal Medicine Teaching Service [phone] (7AM-5PM Mon-Fri) 12/13/2013, 1:41 PM

## 2013-12-13 NOTE — Progress Notes (Signed)
I will follow up with pt and family tomorrow to assist in planning most appropriate rehab venue of inpt rehab vs SNF when medically ready. [phone]

## 2013-12-13 NOTE — Progress Notes (Signed)
Clinical Social Work Department BRIEF PSYCHOSOCIAL ASSESSMENT 12/13/2013  Patient:  [name], [name]     Account Number:  [id number]     Admit date:  12/11/2013  Clinical Social Worker:  Freeman Caldron  Date/Time:  12/13/2013 03:25 PM  Referred by:  Physician  Date Referred:  12/13/2013 Referred for  SNF Placement   Other Referral:   Interview type:  Patient Other interview type:    PSYCHOSOCIAL DATA Living Status:  FAMILY Admitted from facility:   Level of care:   Primary support name:  Delanna Notice [phone]) Primary support relationship to patient:  FAMILY Degree of support available:   Good--pt lives with care aide and cousin.    CURRENT CONCERNS Current Concerns  Post-Acute Placement   Other Concerns:    SOCIAL WORK ASSESSMENT / PLAN CSW introduced self and explained role in care plan. Pt understanding of recommendation for rehab at discharge. Pt has an aide in the house and lives with cousin. Pt states he has been to inpatient rehab before, and hopes they can take him back. CSW explained SNF search as back-up and pt was understanding. CSW also explained if pt does have to go to SNF, he would have to stay for at least 30 days as this is a Medicaid requirement. Pt understands. CSW has made referrals to all SNFs in [facility] and will follow up with pt once/if bed offers are made.   Assessment/plan status:  Psychosocial Support/Ongoing Assessment of Needs Other assessment/ plan:   Information/referral to community resources:   CIR vs. SNF    PATIENT'S/FAMILY'S RESPONSE TO PLAN OF CARE: Good--pt understanding of CSW role in care plan. Hopeful CIR can take pt again for rehab.       Ky Barban, MSW, [facility] Clinical Social Worker [phone]

## 2013-12-13 NOTE — Progress Notes (Signed)
Subjective: Patient has no current complaint, had 3 seizures yesterday, moved to SDU.  No seizure since last night. Objective: Vital signs in last 24 hours: Filed Vitals:   12/13/13 0400 12/13/13 0741 12/13/13 0900 12/13/13 1250  BP: 141/91 139/78 165/86 139/80  Pulse: 78 80 77 79  Temp:  97.7 F (36.5 C)  98 F (36.7 C)  TempSrc:  Oral  Oral  Resp: 21 27 28 19   Height:      Weight:      SpO2: 96% 100% 99% 100%   Weight change: 5.946 kg (13 lb 1.7 oz)  Intake/Output Summary (Last 24 hours) at 12/13/13 1318 Last data filed at 12/13/13 1253  Gross per 24 hour  Intake   1192 ml  Output    750 ml  Net    442 ml   BP 139/80  Pulse 79  Temp(Src) 98 F (36.7 C) (Oral)  Resp 19  Ht 5\' 8"  (1.727 m)  Wt 75.8 kg (167 lb 1.7 oz)  BMI 25.41 kg/m2  SpO2 100%  General Appearance:    Alert, cooperative, no distress, appears stated age  Head:    Normocephalic, without obvious abnormality, atraumatic  Lungs:     Clear to auscultation bilaterally, respirations unlabored  Chest wall:    No tenderness or deformity  Heart:    Regular rate and rhythm, S1 and S2 normal, no murmur, rub   or gallop  Abdomen:     Soft, non-tender, bowel sounds active all four quadrants,    no masses, no organomegaly  Extremities:   Extremities normal, atraumatic, no cyanosis or edema  Pulses:   2+ and symmetric all extremities  Neurologic:   CNII-XII intact. Strength 5/5 in right upper and lower limb. And 3/5 in left upper and lower limbs.   Lab Results: @LABTEST2 @ Micro Results: No results found for this or any previous visit (from the past 240 hour(s)). Studies/Results: No results found. Medications:  Scheduled Meds: . atorvastatin  80 mg Oral Daily  . chlorhexidine  15 mL Mouth/Throat BID  . clopidogrel  75 mg Oral Q breakfast  . feeding supplement (ENSURE COMPLETE)  237 mL Oral TID WC  . folic acid  1 mg Oral Daily  . levETIRAcetam  1,500 mg Oral BID  . LORazepam  1 mg Intravenous Once  .  multivitamin with minerals  1 tablet Oral Daily  . polyethylene glycol  17 g Oral Daily  . thiamine  100 mg Oral Daily  . tiotropium  18 mcg Inhalation Daily   Continuous Infusions:  PRN Meds:.acetaminophen, albuterol, LORazepam Assessment/Plan: Principal Problem:   Seizures Active Problems:   Hypertension   COPD   Cerebral embolism with cerebral infarction   Dyslipidemia   Alcohol abuse   Localized swelling, mass, or lump of lower extremity   Left-sided weakness, residual s/p cva   Hypokalemia   Protein-calorie malnutrition, severe   Anemia, chronic disease   HTN (hypertension)   Anemia, iron deficiency Seizure: had a neurology consult today, they increased his Kappra from 1000 BID to 1500 BID. Hypertension; BP seems ok . Keep monitoring as he is not getting his HTN meds. Anemia: His Hgb improved from 6.7 to 8.7 today after 2 units of blood . Anemia pannel is indicative of combined anemia of chronic disease and iron deficiency. Needs further evaluation if he is having a source of bleeding or poor diet. Waiting on FOBT . COPD: no current complaints. Continue current treatment. Constipation:Getting miralax since yesterday. Still no  bowel movement. Continue with that.  This is a Careers information officer Note.  The care of the patient was discussed with Dr. Gordy Levan and the assessment and plan formulated with their assistance.  Please see their attached note for official documentation of the daily encounter.   LOS: 2 days   Lorella Nimrod, Med Student 12/13/2013, 1:18 PM

## 2013-12-13 NOTE — Progress Notes (Signed)
I repeated the critical or key portions of the exam.  I confirmed/revised the medical student's history, exam, assessment and plan.

## 2013-12-13 NOTE — Progress Notes (Signed)
Subjective: Patient currently having no seizure activity, awake and oriented.  Per notes he had 1 seizure at 0445, 2 seizures at 0646 and 1 seizure at 0837. Patient has no complaints at this time.   Last dose Keppra given at 1015   Objective: Current vital signs: BP 165/86  Pulse 77  Temp(Src) 97.7 F (36.5 C) (Oral)  Resp 28  Ht 5\' 8"  (1.727 m)  Wt 75.8 kg (167 lb 1.7 oz)  BMI 25.41 kg/m2  SpO2 99% Vital signs in last 24 hours: Temp:  [97.7 F (36.5 C)-98.6 F (37 C)] 97.7 F (36.5 C) (05/06 0741) Pulse Rate:  [71-95] 77 (05/06 0900) Resp:  [0-28] 28 (05/06 0900) BP: (99-165)/(52-93) 165/86 mmHg (05/06 0900) SpO2:  [96 %-100 %] 99 % (05/06 0900) Weight:  [75.8 kg (167 lb 1.7 oz)] 75.8 kg (167 lb 1.7 oz) (05/06 0345)  Intake/Output from previous day: 05/05 0701 - 05/06 0700 In: 1042 [P.O.:222; I.V.:150; Blood:670] Out: 300 [Urine:300] Intake/Output this shift: Total I/O In: 150 [I.V.:150] Out: 150 [Urine:150] Nutritional status: Dysphagia  Neurologic Exam: Mental Status: Alert, oriented to hospital.  Speech fluent without evidence of aphasia.  Able to follow 3 step commands without difficulty. Cranial Nerves: II: Visual fields grossly normal, pupils equal, round, reactive to light and accommodation III,IV, VI: ptosis not present, extra-ocular motions intact bilaterally V,VII: smile symmetric, facial light touch sensation normal bilaterally VIII: hearing normal bilaterally IX,X: gag reflex present XI: bilateral shoulder shrug XII: midline tongue extension without atrophy or fasciculations  Motor: Right UE and LE 5/5, Left UE and LE 4/5  Sensory: Pinprick and light touch intact throughout, bilaterally Deep Tendon Reflexes:  Right: Upper Extremity   Left: Upper extremity   biceps (C-5 to C-6) 2/4   biceps (C-5 to C-6) 2/4 tricep (C7) 2/4    triceps (C7) 2/4 Brachioradialis (C6) 2/4  Brachioradialis (C6) 2/4  Lower Extremity Lower Extremity  quadriceps (L-2 to  L-4) 1/4   quadriceps (L-2 to L-4) 1/4 Achilles (S1) 0/4   Achilles (S1) 0/4  Plantars: Right: downgoing   Left: mute    Lab Results: Basic Metabolic Panel:  Recent Labs Lab 12/08/13 2152 12/11/13 1447 12/12/13 0015 12/12/13 0815 12/13/13 0535  NA 139 135* 137 138 141  K 4.3 3.7 3.4* 4.1 4.2  CL 101 98 99 105 108  CO2 24 23 25 24 24   GLUCOSE 117* 101* 86 95 98  BUN 20 27* 29* 31* 27*  CREATININE 1.72* 1.25 1.34 1.24 0.94  CALCIUM 10.3 10.1 10.2 9.8 9.7  MG  --   --   --  1.9  --     Liver Function Tests:  Recent Labs Lab 12/12/13 0015  AST 27  ALT 27  ALKPHOS 52  BILITOT <0.2*  PROT 7.4  ALBUMIN 3.2*   No results found for this basename: LIPASE, AMYLASE,  in the last 168 hours No results found for this basename: AMMONIA,  in the last 168 hours  CBC:  Recent Labs Lab 12/08/13 2152 12/12/13 0015 12/12/13 0815 12/12/13 1456 12/13/13 0535  WBC 8.5 7.2 6.4 6.3 8.2  NEUTROABS  --  3.4  --   --   --   HGB 8.7* 7.4* 6.9* 6.7* 8.7*  HCT 26.5* 22.6* 20.8* 20.8* 26.6*  MCV 84.4 83.7 83.5 84.6 83.4  PLT 384 404* 376 420* 358    Cardiac Enzymes: No results found for this basename: CKTOTAL, CKMB, CKMBINDEX, TROPONINI,  in the last 168 hours  Lipid Panel:  No results found for this basename: CHOL, TRIG, HDL, CHOLHDL, VLDL, LDLCALC,  in the last 168 hours  CBG:  Recent Labs Lab 12/08/13 2151 12/11/13 1417 12/12/13 0008  GLUCAP 116* 86 87    Microbiology: Results for orders placed during the hospital encounter of 10/24/13  CULTURE, BLOOD (ROUTINE X 2)     Status: None   Collection Time    10/24/13  6:45 PM      Result Value Ref Range Status   Specimen Description BLOOD LEFT ARM   Final   Special Requests BOTTLES DRAWN AEROBIC AND ANAEROBIC 5CC   Final   Culture  Setup Time     Final   Value: 10/25/2013 01:02     Performed at [facility]   Culture     Final   Value: NO GROWTH 5 DAYS     Performed at [facility]   Report Status  10/31/2013 FINAL   Final  CULTURE, BLOOD (ROUTINE X 2)     Status: None   Collection Time    10/24/13  7:17 PM      Result Value Ref Range Status   Specimen Description BLOOD RIGHT FOREARM   Final   Special Requests BOTTLES DRAWN AEROBIC AND ANAEROBIC 5CC   Final   Culture  Setup Time     Final   Value: 10/25/2013 01:04     Performed at [facility]   Culture     Final   Value: NO GROWTH 5 DAYS     Performed at [facility]   Report Status 10/31/2013 FINAL   Final  URINE CULTURE     Status: None   Collection Time    10/25/13  4:10 AM      Result Value Ref Range Status   Specimen Description URINE, CLEAN CATCH   Final   Special Requests Normal   Final   Culture  Setup Time     Final   Value: 10/25/2013 08:05     Performed at [facility]     Final   Value: >=100,000 COLONIES/ML     Performed at [facility]   Culture     Final   Value: Multiple bacterial morphotypes present, none predominant. Suggest appropriate recollection if clinically indicated.     Performed at [facility]   Report Status 10/26/2013 FINAL   Final  GRAM STAIN     Status: None   Collection Time    10/25/13  4:11 AM      Result Value Ref Range Status   Specimen Description URINE, CLEAN CATCH   Final   Special Requests Normal   Final   Gram Stain     Final   Value: CYTOSPUN     WBC PRESENT,BOTH PMN AND MONONUCLEAR     Multiple bacterial morphotypes present, none predominant. Suggest appropriate recollection if clinically indicated.     YEAST     Results Called toZelphia Cairo RN [phone] Perlie Mayo   Report Status 10/25/2013 FINAL   Final    Coagulation Studies: No results found for this basename: LABPROT, INR,  in the last 72 hours  Imaging: No results found.  Medications:  Scheduled: . atorvastatin  80 mg Oral Daily  . chlorhexidine  15 mL Mouth/Throat BID  . clopidogrel  75 mg Oral Q breakfast  . feeding supplement (ENSURE COMPLETE)  237  mL Oral TID WC  . folic acid  1 mg  Oral Daily  . levETIRAcetam  1,000 mg Oral BID  . LORazepam  1 mg Intravenous Once  . multivitamin with minerals  1 tablet Oral Daily  . polyethylene glycol  17 g Oral Daily  . thiamine  100 mg Oral Daily  . tiotropium  18 mcg Inhalation Daily    Assessment/Plan: Patient with multiple seizures this morning despite 1000mg  of Keppra BID.  Seems to be tolerating Keppra without incident.    Recommendations: 1.  Increase Keppra to 1500mg  BID.  500mg  IV Keppra now with increase in po maintenance to start tonight.    LOS: 2 days   Alexis Goodell, MD [facility] [phone]  12/13/2013  10:51 AM

## 2013-12-14 ENCOUNTER — Ambulatory Visit: Payer: Self-pay | Admitting: Internal Medicine

## 2013-12-14 LAB — TYPE AND SCREEN
ABO/RH(D): B POS
Antibody Screen: NEGATIVE
Unit division: 0
Unit division: 0

## 2013-12-14 LAB — CBC WITH DIFFERENTIAL/PLATELET
Basophils Absolute: 0 10*3/uL (ref 0.0–0.1)
Basophils Relative: 0 % (ref 0–1)
EOS PCT: 2 % (ref 0–5)
Eosinophils Absolute: 0.2 10*3/uL (ref 0.0–0.7)
HEMATOCRIT: 25.1 % — AB (ref 39.0–52.0)
HEMOGLOBIN: 8.2 g/dL — AB (ref 13.0–17.0)
Lymphocytes Relative: 42 % (ref 12–46)
Lymphs Abs: 3.5 10*3/uL (ref 0.7–4.0)
MCH: 27.2 pg (ref 26.0–34.0)
MCHC: 32.7 g/dL (ref 30.0–36.0)
MCV: 83.1 fL (ref 78.0–100.0)
Monocytes Absolute: 0.8 10*3/uL (ref 0.1–1.0)
Monocytes Relative: 10 % (ref 3–12)
Neutro Abs: 3.9 10*3/uL (ref 1.7–7.7)
Neutrophils Relative %: 46 % (ref 43–77)
Platelets: 357 10*3/uL (ref 150–400)
RBC: 3.02 MIL/uL — ABNORMAL LOW (ref 4.22–5.81)
RDW: 14.8 % (ref 11.5–15.5)
WBC: 8.4 10*3/uL (ref 4.0–10.5)

## 2013-12-14 NOTE — Progress Notes (Signed)
Physical Therapy Treatment Patient Details Name: [name] MRN: [id number] DOB: 06-23-1951 Today's Date: 12/14/2013    History of Present Illness 63 y.o. male admitted to [facility] on 5/4 with seizures.  Of note, he was recently d/c from [facility] on 12/05/13 and 11/24/13 and this admission is his 5th admission this year.  Pt with significant PMhx of HTN, COPD, stroke (with some mild left sided weakness), COPD, right leg pinning, and right toe amputation.      PT Comments    Pt progressing slowly towards physical therapy goals. Per chart review, pt's family requesting short term rehab at the SNF level at d/c in order to prepare for pt to return home. PT in agreement due to current functional limitations and difficulty ambulating. Discharge disposition and frequency appropriately updated to reflect this change. Will continue to progress per POC.   Follow Up Recommendations  Supervision/Assistance - 24 hour;SNF     Equipment Recommendations  None recommended by PT    Recommendations for Other Services       Precautions / Restrictions Precautions Precautions: Fall Precaution Comments: left hemiparesis Restrictions Weight Bearing Restrictions: No    Mobility  Bed Mobility               General bed mobility comments: Pt received sitting on BSC.  Transfers Overall transfer level: Needs assistance Equipment used: Rolling walker (2 wheeled) Transfers: Sit to/from Stand Sit to Stand: Mod assist;+2 physical assistance         General transfer comment: Pt required assist to power-up to full standing with assist for anterior lean to maintain balance. Hand-over-hand assist for LUE guidance to walker.   Ambulation/Gait Ambulation/Gait assistance: Mod assist Ambulation Distance (Feet): 10 Feet Assistive device: Rolling walker (2 wheeled) Gait Pattern/deviations: Step-to pattern;Decreased weight shift to right Gait velocity: decreased Gait velocity interpretation: Below normal speed  for age/gender General Gait Details: Assist to advance LLE and for weight shifting to R to unweight LLE for steps. Pt able to independently advance RLE however steps very choppy and uneven. Close chair follow required, as well as heavy mod assist for pt to maintain balance.    Stairs            Wheelchair Mobility    Modified Rankin (Stroke Patients Only) Modified Rankin (Stroke Patients Only) Pre-Morbid Rankin Score: Moderately severe disability Modified Rankin: Severe disability     Balance Overall balance assessment: Needs assistance Sitting-balance support: Feet supported;Single extremity supported Sitting balance-Leahy Scale: Fair   Postural control: Posterior lean;Left lateral lean Standing balance support: Bilateral upper extremity supported Standing balance-Leahy Scale: Poor                      Cognition Arousal/Alertness: Awake/alert Behavior During Therapy: WFL for tasks assessed/performed Overall Cognitive Status: Within Functional Limits for tasks assessed                      Exercises General Exercises - Lower Extremity Quad Sets: 15 reps Long Arc Quad: 15 reps Hip ABduction/ADduction: 15 reps (isometric pillow squeeze and abd AROM)    General Comments        Pertinent Vitals/Pain Pt does not complain of pain throughout session.     Home Living                      Prior Function            PT Goals (current goals can now be  found in the care plan section) Acute Rehab PT Goals Patient Stated Goal: Did not state goals. PT Goal Formulation: With patient Time For Goal Achievement: 12/26/13 Potential to Achieve Goals: Fair Progress towards PT goals: Progressing toward goals    Frequency  Min 2X/week    PT Plan Discharge plan needs to be updated;Frequency needs to be updated    Co-evaluation             End of Session Equipment Utilized During Treatment: Gait belt Activity Tolerance: Patient limited by  fatigue Patient left: in chair;with call bell/phone within reach     Time: 1051-1115 PT Time Calculation (min): 24 min  Charges:  $Gait Training: 8-22 mins $Therapeutic Exercise: 8-22 mins                    G Codes:      Jolyn Lent 12/17/13, 3:01 PM  Jolyn Lent, PT, DPT Acute Rehabilitation Services Pager: [phone]

## 2013-12-14 NOTE — Progress Notes (Signed)
Gave pt bed offers from SNFs. [facility] are placement options. Pt states he has no preference and "wants to get out the hospital as quickly as possible." Placement note to follow. First choice is still [facility].  Ky Barban, MSW, [facility] Clinical Social Worker [phone]

## 2013-12-14 NOTE — Progress Notes (Signed)
Attempt to call report to 5N. Secretary  Can not get nurse to answer the phone. I did attempt to call Seymour Bars RN @ [phone] however, I was unsuccessful in getting a response. Will pass report to oncoming nurse.

## 2013-12-14 NOTE — Progress Notes (Signed)
I met with pt at bedside to discuss his rehab needs. He was admitted to our inpt rehab center 5/14 and was then d/c'd to a SNF for he did not have 24/7 assist as recommended at home. I attempted to contact his cousin, [name], by phone and unable to reach her. I contacted her sister, Crystal, and she is to have Roy call me to discuss inpt rehab vs SNF dispo pending his caregiver support at home. [phone]

## 2013-12-14 NOTE — Progress Notes (Signed)
Subjective: No further seizures over night. Tolerating the increased dose of Keppra well. Next dose due at 10:00  Objective: Current vital signs: BP 155/87  Pulse 70  Temp(Src) 98.1 F (36.7 C) (Oral)  Resp 13  Ht 5\' 8"  (1.727 m)  Wt 75.8 kg (167 lb 1.7 oz)  BMI 25.41 kg/m2  SpO2 100% Vital signs in last 24 hours: Temp:  [98 F (36.7 C)-98.9 F (37.2 C)] 98.1 F (36.7 C) (05/07 3235) Pulse Rate:  [70-100] 70 (05/07 0812) Resp:  [13-20] 13 (05/07 0812) BP: (132-155)/(61-95) 155/87 mmHg (05/07 0812) SpO2:  [100 %] 100 % (05/07 0812) Weight:  [75.8 kg (167 lb 1.7 oz)] 75.8 kg (167 lb 1.7 oz) (05/07 0403)  Intake/Output from previous day: 05/06 0701 - 05/07 0700 In: 250 [P.O.:100; I.V.:150] Out: 1125 [Urine:1125] Intake/Output this shift:   Nutritional status: Dysphagia  Neurologic Exam: Mental Status: Alert, oriented, thought content appropriate.  Speech dysarthric (baseline) without evidence of aphasia.  Able to follow 3 step commands without difficulty. Cranial Nerves: II:  Visual fields grossly normal, pupils equal, round, reactive to light and accommodation III,IV, VI: ptosis not present, extra-ocular motions intact bilaterally V,VII: smile symmetric, facial light touch sensation normal bilaterally VIII: hearing normal bilaterally IX,X: gag reflex present XI: bilateral shoulder shrug XII: midline tongue extension without atrophy or fasciculations  Motor: Right : Upper extremity   5/5    Left:     Upper extremity   4/5  Lower extremity   5/5     Lower extremity   4/5 Tone and bulk:normal tone throughout; no atrophy noted Sensory: Pinprick and light touch intact throughout, bilaterally Deep Tendon Reflexes:  Right: Upper Extremity   Left: Upper extremity   biceps (C-5 to C-6) 2/4   biceps (C-5 to C-6) 2/4 tricep (C7) 2/4    triceps (C7) 2/4 Brachioradialis (C6) 2/4  Brachioradialis (C6) 2/4  Lower Extremity Lower Extremity  quadriceps (L-2 to L-4) 1/4    quadriceps (L-2 to L-4) 1/4 Achilles (S1) 0/4   Achilles (S1) 0/4  Plantars: Right: downgoing   Left: mute    Lab Results: Basic Metabolic Panel:  Recent Labs Lab 12/08/13 2152 12/11/13 1447 12/12/13 0015 12/12/13 0815 12/13/13 0535  NA 139 135* 137 138 141  K 4.3 3.7 3.4* 4.1 4.2  CL 101 98 99 105 108  CO2 24 23 25 24 24   GLUCOSE 117* 101* 86 95 98  BUN 20 27* 29* 31* 27*  CREATININE 1.72* 1.25 1.34 1.24 0.94  CALCIUM 10.3 10.1 10.2 9.8 9.7  MG  --   --   --  1.9  --     Liver Function Tests:  Recent Labs Lab 12/12/13 0015  AST 27  ALT 27  ALKPHOS 52  BILITOT <0.2*  PROT 7.4  ALBUMIN 3.2*   No results found for this basename: LIPASE, AMYLASE,  in the last 168 hours No results found for this basename: AMMONIA,  in the last 168 hours  CBC:  Recent Labs Lab 12/12/13 0015 12/12/13 0815 12/12/13 1456 12/13/13 0535 12/14/13 0337  WBC 7.2 6.4 6.3 8.2 8.4  NEUTROABS 3.4  --   --   --  3.9  HGB 7.4* 6.9* 6.7* 8.7* 8.2*  HCT 22.6* 20.8* 20.8* 26.6* 25.1*  MCV 83.7 83.5 84.6 83.4 83.1  PLT 404* 376 420* 358 357    Cardiac Enzymes: No results found for this basename: CKTOTAL, CKMB, CKMBINDEX, TROPONINI,  in the last 168 hours  Lipid Panel: No  results found for this basename: CHOL, TRIG, HDL, CHOLHDL, VLDL, LDLCALC,  in the last 168 hours  CBG:  Recent Labs Lab 12/08/13 2151 12/11/13 1417 12/12/13 0008  GLUCAP 116* 86 87    Microbiology: Results for orders placed during the hospital encounter of 10/24/13  CULTURE, BLOOD (ROUTINE X 2)     Status: None   Collection Time    10/24/13  6:45 PM      Result Value Ref Range Status   Specimen Description BLOOD LEFT ARM   Final   Special Requests BOTTLES DRAWN AEROBIC AND ANAEROBIC 5CC   Final   Culture  Setup Time     Final   Value: 10/25/2013 01:02     Performed at [facility]   Culture     Final   Value: NO GROWTH 5 DAYS     Performed at [facility]   Report Status 10/31/2013  FINAL   Final  CULTURE, BLOOD (ROUTINE X 2)     Status: None   Collection Time    10/24/13  7:17 PM      Result Value Ref Range Status   Specimen Description BLOOD RIGHT FOREARM   Final   Special Requests BOTTLES DRAWN AEROBIC AND ANAEROBIC 5CC   Final   Culture  Setup Time     Final   Value: 10/25/2013 01:04     Performed at [facility]   Culture     Final   Value: NO GROWTH 5 DAYS     Performed at [facility]   Report Status 10/31/2013 FINAL   Final  URINE CULTURE     Status: None   Collection Time    10/25/13  4:10 AM      Result Value Ref Range Status   Specimen Description URINE, CLEAN CATCH   Final   Special Requests Normal   Final   Culture  Setup Time     Final   Value: 10/25/2013 08:05     Performed at [place]     Final   Value: >=100,000 COLONIES/ML     Performed at [facility]   Culture     Final   Value: Multiple bacterial morphotypes present, none predominant. Suggest appropriate recollection if clinically indicated.     Performed at [facility]   Report Status 10/26/2013 FINAL   Final  GRAM STAIN     Status: None   Collection Time    10/25/13  4:11 AM      Result Value Ref Range Status   Specimen Description URINE, CLEAN CATCH   Final   Special Requests Normal   Final   Gram Stain     Final   Value: [id number]     WBC PRESENT,BOTH PMN AND MONONUCLEAR     Multiple bacterial morphotypes present, none predominant. Suggest appropriate recollection if clinically indicated.     YEAST     Results Called toZelphia Cairo RN [phone] Perlie Mayo   Report Status 10/25/2013 FINAL   Final    Coagulation Studies: No results found for this basename: LABPROT, INR,  in the last 72 hours  Imaging: No results found.  Medications:  Scheduled: . atorvastatin  80 mg Oral Daily  . chlorhexidine  15 mL Mouth/Throat BID  . clopidogrel  75 mg Oral Q breakfast  . docusate sodium  100 mg Oral BID  . feeding  supplement (ENSURE COMPLETE)  237 mL Oral TID  WC  . folic acid  1 mg Oral Daily  . iron polysaccharides  150 mg Oral Daily  . levETIRAcetam  1,500 mg Oral BID  . LORazepam  1 mg Intravenous Once  . multivitamin with minerals  1 tablet Oral Daily  . polyethylene glycol  17 g Oral Daily  . thiamine  100 mg Oral Daily  . tiotropium  18 mcg Inhalation Daily    Assessment/Plan: No further seizures.  Patient controlled on Keppra at current dose.  Seems to be tolerating increase well.   Recommendations: 1.  Continue Keppra at current dose. 2.  Continue seizure precautions    LOS: 3 days   Alexis Goodell, MD [facility] [phone]  12/14/2013  9:53 AM

## 2013-12-14 NOTE — Progress Notes (Signed)
[name], cousin, of pt returned my call. She is requesting a 30 day rehab at a SNF so that she has time to prepare for him to return home and her arrange assist at home. Pt therefore not to be admitted to inpt rehab. I have notified SW and RN [place]. [phone]

## 2013-12-14 NOTE — Progress Notes (Signed)
Subjective:     VSS.  Pt is on the bedside commode this AM.  He has no complaints and no seizures since keppra was increased to 1500mg  bid.     Objective:   Vital signs in last 24 hours: Filed Vitals:   12/13/13 2353 12/14/13 0403 12/14/13 0812 12/14/13 1140  BP: 136/77 153/89 155/87 142/69  Pulse: 100  70 79  Temp: 98.5 F (36.9 C) 98.2 F (36.8 C) 98.1 F (36.7 C) 98.2 F (36.8 C)  TempSrc: Oral Oral Oral Oral  Resp: 20  13 22   Height:      Weight:  167 lb 1.7 oz (75.8 kg)    SpO2: 100% 100% 100% 99%   Weight change: 0 lb (0 kg)  Intake/Output Summary (Last 24 hours) at 12/14/13 1541 Last data filed at 12/14/13 1147  Gross per 24 hour  Intake    100 ml  Output   1276 ml  Net  -1176 ml    Physical Exam: Constitutional: He is oriented to person, place, and time. No distress.  HENT:  Head: Normocephalic and atraumatic.  Eyes: EOM are normal. Pupils are equal, round, and reactive to light.  Cardiovascular: Normal rate, regular rhythm, normal heart sounds and intact distal pulses.  Pulmonary/Chest: Effort normal and breath sounds normal. No respiratory distress. He has no wheezes.  Abdominal: Soft. Bowel sounds are normal. There is no tenderness. There is no rebound.  Musculoskeletal: He exhibits no edema.  ~4cm nodule on anterior left tibular head below patella  Neurological: He is alert and oriented to person, place, and time.  5/5 RUE, grip strength, and RLE strength 2-3/5 LUE and LLE strength, 3+/5 left grip strength. Decreased left shoulder shrug otherwise CN2-12 grossly intact  Skin: Skin is warm and dry.    Lab Results:  BMP:  Recent Labs Lab 12/12/13 0815 12/13/13 0535  NA 138 141  K 4.1 4.2  CL 105 108  CO2 24 24  GLUCOSE 95 98  BUN 31* 27*  CREATININE 1.24 0.94  CALCIUM 9.8 9.7  MG 1.9  --    Anion Gap: 9   CBC:  Recent Labs Lab 12/12/13 0015  12/13/13 0535 12/14/13 0337  WBC 7.2  < > 8.2 8.4  NEUTROABS 3.4  --   --  3.9    HGB 7.4*  < > 8.7* 8.2*  HCT 22.6*  < > 26.6* 25.1*  MCV 83.7  < > 83.4 83.1  PLT 404*  < > 358 357  < > = values in this interval not displayed.  Coagulation: No results found for this basename: LABPROT, INR,  in the last 168 hours  CBG:            Recent Labs Lab 12/08/13 2151 12/11/13 1417 12/12/13 0008  GLUCAP 116* 86 87           HA1C:      No results found for this basename: HGBA1C,  in the last 168 hours  Lipid Panel: No results found for this basename: CHOL, HDL, LDLCALC, TRIG, CHOLHDL, LDLDIRECT,  in the last 168 hours  LFTs:  Recent Labs Lab 12/12/13 0015  AST 27  ALT 27  ALKPHOS 52  BILITOT <0.2*  PROT 7.4  ALBUMIN 3.2*    Pancreatic Enzymes: No results found for this basename: LIPASE, AMYLASE,  in the last 168 hours  Lactic Acid/Procalcitonin: No results found for this basename: LATICACIDVEN, PROCALCITON,  in the last 168 hours  Ammonia: No  results found for this basename: AMMONIA,  in the last 168 hours  Cardiac Enzymes: No results found for this basename: CKTOTAL, CKMB, CKMBINDEX, TROPONINI,  in the last 168 hours Lab Results  Component Value Date   CKTOTAL 187 12/02/2013   CKMB 2.5 10/14/2010   TROPONINI <0.30 12/02/2013    EKG: EKG Interpretation  Date/Time:  Tuesday Dec 12 2013 00:18:56 EDT Ventricular Rate:  85 PR Interval:  214 QRS Duration: 120 QT Interval:  406 QTC Calculation: 483 R Axis:   -23 Text Interpretation:  Sinus rhythm with 1st degree A-V block Non-specific intra-ventricular conduction delay Borderline ECG ED PHYSICIAN INTERPRETATION AVAILABLE IN [facility] [place] Confirmed by TEST, Record ([id number]) on 12/14/2013 7:10:04 AM   BNP: No results found for this basename: PROBNP,  in the last 168 hours  D-Dimer: No results found for this basename: DDIMER,  in the last 168 hours  Urinalysis: No results found for this basename: COLORURINE, APPERANCEUR, LABSPEC, PHURINE, GLUCOSEU, HGBUR, BILIRUBINUR, KETONESUR, PROTEINUR,  UROBILINOGEN, NITRITE, LEUKOCYTESUR,  in the last 168 hours  Micro Results: No results found for this or any previous visit (from the past 240 hour(s)).  Blood Culture:    Component Value Date/Time   SDES URINE, CLEAN CATCH 10/25/2013 0411   SPECREQUEST Normal 10/25/2013 0411   CULT  Value: Multiple bacterial morphotypes present, none predominant. Suggest appropriate recollection if clinically indicated. Performed at [facility] 10/25/2013 0410   REPTSTATUS 10/25/2013 FINAL 10/25/2013 0411    Studies/Results: No results found.  Medications:  Scheduled Meds: . atorvastatin  80 mg Oral Daily  . chlorhexidine  15 mL Mouth/Throat BID  . clopidogrel  75 mg Oral Q breakfast  . docusate sodium  100 mg Oral BID  . feeding supplement (ENSURE COMPLETE)  237 mL Oral TID WC  . folic acid  1 mg Oral Daily  . iron polysaccharides  150 mg Oral Daily  . levETIRAcetam  1,500 mg Oral BID  . LORazepam  1 mg Intravenous Once  . multivitamin with minerals  1 tablet Oral Daily  . polyethylene glycol  17 g Oral Daily  . thiamine  100 mg Oral Daily  . tiotropium  18 mcg Inhalation Daily   Continuous Infusions:   PRN Meds:.acetaminophen, albuterol, LORazepam  Antibiotics: Anti-infectives   None     Antibiotics Given (last 72 hours)   None      Day of Hospitalization:  3 Consults: Treatment Team:  Catarina Hartshorn, MD  Assessment/Plan:   Principal Problem:   Seizures Active Problems:   Hypertension   COPD   Cerebral embolism with cerebral infarction   Dyslipidemia   Alcohol abuse   Localized swelling, mass, or lump of lower extremity   Left-sided weakness, residual s/p cva   Hypokalemia   Protein-calorie malnutrition, severe   Anemia, chronic disease   HTN (hypertension)   Anemia, iron deficiency  Seizures  Resolved; pt has been seizure free for 24 hrs.  He was transferred to SDU yesterday.  Neurology following.   Most likely d/t non-compliance with meds?  However,  would seem this is less likely given that pt continues to have seizures during hospitalization.  Neurology increased keppra to 1g keppra bid -->1500mg  bid yesterday.  12/04/13 EEG: Normal awake and drowsy EEG.  Electrolytes normal.  Does not appear to be on any meds that may lower the seizure threshold.  He denies having any aura associated with his seizures and reports he just "starts shaking."   - Seizure precautions  -  Ativan PRN  - Keppra 1500mg  BID  - Neurology consulted, appreciate recs  Possible acute on chronic anemia  Stable. Pt hgb 7.4 on admission, but dropped to 6.9-->6.7.  Also, BUN elevated, possible GIB?  Denies any CP, SOB.  Pt denies any BRBPR, bloody stools, or hematuria.  Pt denies any h/o blood transfusions.  Pt had colonoscopy on 10/20/13 with tubular adenomas.  Pt denies any previous EGD.  He was transfused 2 units of prbcs-hgb increased to 8.7-->8.2 today.  Anemia panel revealed low iron (27)  and ferritin (16). -FOBT pending  -GI outpatient -niferex qd  CKD Stage 2  Pt with baseline Cr of ~1.  Creatinine elevated upon admission but trended down.  He continues to have a mildly elevated BUN--GIB? Unlikely prerenal cause for elevation in BUN since cre has come down.    Hypertension  Stable. BP low normal on admission; goal BP<150/90. - Hold home antihypertensive medications  - Monitor closely as has history of malignant hypertension   COPD  Stable. No wheezing or dyspnea.  Saturating 100% on RA.  -Continue Albuterol PRN  -Continue Spiriva   Dyslipidemia  -Continue Atorvastatin   Hypokalemia  Resolved.   Protein-calorie malnutrition, severe  -appreciate nutrition recs  Constipation -miralax qd  #VTE PPx  SCDs  #Dispo- Transfer for SDU today and to med-surg.  Awaiting SNF placement hopefully tomorrow.     LOS: 3 days   Jones Bales, MD PGY-1, Internal Medicine Teaching Service [phone] (7AM-5PM Mon-Fri) 12/14/2013, 3:41 PM

## 2013-12-14 NOTE — Progress Notes (Addendum)
Clinical Social Work Department CLINICAL SOCIAL WORK PLACEMENT NOTE 12/14/2013  Patient:  [name], [name]  Account Number:  [id number] Admit date:  12/11/2013  Clinical Social Worker:  Ky Barban, Latanya Presser  Date/time:  12/13/2013 12:13 PM  Clinical Social Work is seeking post-discharge placement for this patient at the following level of care:   [facility]   (*CSW will update this form in Epic as items are completed)   12/14/2013  Provided list of bed offers Patient/family provided with [place] Department of Clinical Social Work's list of facilities offering this level of care within the geographic area requested by the patient (or if unable, by the patient's family).  12/14/2013  Patient/family informed of their freedom to choose among providers that offer the needed level of care, that participate in Medicare, Medicaid or managed care program needed by the patient, have an available bed and are willing to accept the patient.  N/A-pt did not receive bed offer from this facility  Patient/family informed of [facility]' ownership interest in [facility], as well as of the fact that they are under no obligation to receive care at this facility.  PASARR submitted to EDS on EXISTING PASARR number received from EDS on EXISTING  FL2 transmitted to all facilities in geographic area requested by pt/family on  12/13/2013 FL2 transmitted to all facilities within larger geographic area on   Patient informed that his/her managed care company has contracts with or will negotiate with  certain facilities, including the following:     Patient/family informed of bed offers received:  12/14/2013 Patient chooses bed at  Physician recommends and patient chooses bed at  [facility]  Patient to be transferred to  on  12/14/13 Patient to be transferred to facility by [facility]  The following physician request were entered in Epic:   Additional Comments: Gave pt bed offers.  [facility] are placement options. Pt states he has no preference and "wants to get out the hospital as quickly as possible."    Ky Barban, MSW, [place] Social Worker [phone]

## 2013-12-15 ENCOUNTER — Telehealth: Payer: Self-pay | Admitting: Neurology

## 2013-12-15 DIAGNOSIS — N039 Chronic nephritic syndrome with unspecified morphologic changes: Secondary | ICD-10-CM

## 2013-12-15 DIAGNOSIS — D509 Iron deficiency anemia, unspecified: Secondary | ICD-10-CM

## 2013-12-15 DIAGNOSIS — Z79899 Other long term (current) drug therapy: Secondary | ICD-10-CM

## 2013-12-15 DIAGNOSIS — D631 Anemia in chronic kidney disease: Secondary | ICD-10-CM

## 2013-12-15 LAB — CBC
HCT: 27.4 % — ABNORMAL LOW (ref 39.0–52.0)
Hemoglobin: 8.9 g/dL — ABNORMAL LOW (ref 13.0–17.0)
MCH: 27.5 pg (ref 26.0–34.0)
MCHC: 32.5 g/dL (ref 30.0–36.0)
MCV: 84.6 fL (ref 78.0–100.0)
Platelets: 375 10*3/uL (ref 150–400)
RBC: 3.24 MIL/uL — ABNORMAL LOW (ref 4.22–5.81)
RDW: 14.6 % (ref 11.5–15.5)
WBC: 8.6 10*3/uL (ref 4.0–10.5)

## 2013-12-15 MED ORDER — LEVETIRACETAM 500 MG PO TABS: 1500.0000 mg | ORAL_TABLET | Freq: Two times a day (BID) | ORAL | Status: DC

## 2013-12-15 NOTE — Progress Notes (Signed)
Subjective: Patient doing well this am no complaints.  No more seizure like activity. His cousin [name] will be here at 22 to decide which SNF patient going to today.   Objective: Vital signs in last 24 hours: Filed Vitals:   12/14/13 1606 12/14/13 1959 12/14/13 2316 12/15/13 0603  BP: 123/66 149/72 148/74 143/75  Pulse: 79 77 80 73  Temp: 98.4 F (36.9 C) 98.2 F (36.8 C) 98 F (36.7 C) 97.9 F (36.6 C)  TempSrc: Oral Oral Oral Oral  Resp: 25  20 20   Height:      Weight:      SpO2: 98%  99% 98%   Weight change:   Intake/Output Summary (Last 24 hours) at 12/15/13 0926 Last data filed at 12/15/13 0830  Gross per 24 hour  Intake    240 ml  Output   1451 ml  Net  -1211 ml   Vitals reviewed. General: resting in bed, NAD HEENT: [place]/at no scleral icterus Cardiac: RRR, no rubs, murmurs or gallops Pulm: clear to auscultation bilaterally, no wheezes, rales, or rhonchi Abd: soft, nontender, nondistended, BS present Ext: warm and well perfused, no pedal edema Neuro: alert and oriented X3, cranial nerves II-XII grossly intact, left hemiparesis from prior stroke and dysarthria at baseline sequele of prior stroke.   Lab Results: Basic Metabolic Panel:  Recent Labs Lab 12/12/13 0815 12/13/13 0535  NA 138 141  K 4.1 4.2  CL 105 108  CO2 24 24  GLUCOSE 95 98  BUN 31* 27*  CREATININE 1.24 0.94  CALCIUM 9.8 9.7  MG 1.9  --    Liver Function Tests:  Recent Labs Lab 12/12/13 0015  AST 27  ALT 27  ALKPHOS 52  BILITOT <0.2*  PROT 7.4  ALBUMIN 3.2*   CBC:  Recent Labs Lab 12/12/13 0015  12/14/13 0337 12/15/13 0642  WBC 7.2  < > 8.4 8.6  NEUTROABS 3.4  --  3.9  --   HGB 7.4*  < > 8.2* 8.9*  HCT 22.6*  < > 25.1* 27.4*  MCV 83.7  < > 83.1 84.6  PLT 404*  < > 357 375  < > = values in this interval not displayed. CBG:  Recent Labs Lab 12/08/13 2151 12/11/13 1417 12/12/13 0008  GLUCAP 116* 86 87   Anemia Panel:  Recent Labs Lab 12/12/13 1456    VITAMINB12 552  FOLATE >20.0  FERRITIN 16*  TIBC 347  IRON 27*  RETICCTPCT 1.3   Misc. Labs: none   Medications:  Scheduled Meds: . atorvastatin  80 mg Oral Daily  . chlorhexidine  15 mL Mouth/Throat BID  . clopidogrel  75 mg Oral Q breakfast  . docusate sodium  100 mg Oral BID  . feeding supplement (ENSURE COMPLETE)  237 mL Oral TID WC  . folic acid  1 mg Oral Daily  . iron polysaccharides  150 mg Oral Daily  . levETIRAcetam  1,500 mg Oral BID  . LORazepam  1 mg Intravenous Once  . multivitamin with minerals  1 tablet Oral Daily  . polyethylene glycol  17 g Oral Daily  . thiamine  100 mg Oral Daily  . tiotropium  18 mcg Inhalation Daily   Continuous Infusions:  PRN Meds:.acetaminophen, albuterol, LORazepam Assessment/Plan: 63 y.o PMH stroke with residual left hemiparesis and dysarthria, HTN, COPD, HLD, alcohol and substance abuse, h/o prostate cancer, malnutrition, CKD. He presented 5/5 with seizures.   #Seizures -No further while on Keppra 1500 mg bid. No need  for prn Ativan in last 48 hours.   -will need f/u with Neurology outpatient.   #HTN -slightly elevated 143/75  -resume HCTZ, 25 Lisinopril 10 at discharge   #Chronic medical conditions (i.e COPD, HLD, stroke) -Continue home medications     #Protein-calorie malnutrition, severe -encourage po intake   #Anemia, chronic disease and iron deficiency -CBC stable  #F/E/N -NSL -dys 3 diet   #DVT px  -scds     Dispo: D/c today to SNF   The patient does have a current PCP Corky Sox, MD) and does need an [facility] hospital follow-up appointment after discharge.  The patient does not have transportation limitations that hinder transportation to clinic appointments.  .Services Needed at time of discharge: Y = Yes, Blank = No PT:   OT:   RN:   Equipment:   Other: SNF    LOS: 4 days   Cresenciano Genre, MD [phone] 12/15/2013, 9:26 AM

## 2013-12-15 NOTE — Telephone Encounter (Signed)
Paged [name]. Scheduled patient for the next available new patient appt with Dr. Leonie Man. On 12/19/13. Please advise.

## 2013-12-15 NOTE — Progress Notes (Signed)
Pt transferred to 5N. Has bed offers from SNFs, these were given to pt yesterday. Barbara with CIR also called pt's cousin and provided bed offers; pt had no preference. CSW on 5N provided handoff on pt's case; this CSW signing off.   Ky Barban, MSW, [facility] Clinical Social Worker [phone]

## 2013-12-15 NOTE — Progress Notes (Signed)
Subjective: No further seizures while on Keppra 1500 mg BID.  Has no complaints of SE such as drowsiness or agitation.   Objective: Current vital signs: BP 143/75  Pulse 73  Temp(Src) 97.9 F (36.6 C) (Oral)  Resp 20  Ht 5\' 8"  (1.727 m)  Wt 75.8 kg (167 lb 1.7 oz)  BMI 25.41 kg/m2  SpO2 98% Vital signs in last 24 hours: Temp:  [97.9 F (36.6 C)-98.4 F (36.9 C)] 97.9 F (36.6 C) (05/08 0603) Pulse Rate:  [73-80] 73 (05/08 0603) Resp:  [20-25] 20 (05/08 0603) BP: (123-149)/(66-75) 143/75 mmHg (05/08 0603) SpO2:  [98 %-99 %] 98 % (05/08 0603)  Intake/Output from previous day: 05/07 0701 - 05/08 0700 In: 120 [P.O.:120] Out: 1451 [Urine:1450; Stool:1] Intake/Output this shift: Total I/O In: 120 [P.O.:120] Out: -  Nutritional status: Dysphagia  Neurologic Exam: Mental Status:  Alert, oriented, thought content appropriate. Speech dysarthric (baseline) without evidence of aphasia. Able to follow 3 step commands without difficulty.  Cranial Nerves:  II: Visual fields grossly normal, pupils equal, round, reactive to light and accommodation  III,IV, VI: ptosis not present, extra-ocular motions intact bilaterally  V,VII: smile symmetric, facial light touch sensation normal bilaterally  VIII: hearing normal bilaterally  IX,X: gag reflex present  XI: bilateral shoulder shrug  XII: midline tongue extension without atrophy or fasciculations  Motor:  Right : Upper extremity 5/5   Left:  Upper extremity 4/5   Lower extremity 5/5    Lower extremity 4/5  Tone and bulk:normal tone throughout; no atrophy noted  Sensory: Pinprick and light touch intact throughout, bilaterally  Deep Tendon Reflexes:  2+ UE, 1+ KJ and no AJ Plantars:  Right: downgoing   Left: mute      Lab Results: Basic Metabolic Panel:  Recent Labs Lab 12/08/13 2152 12/11/13 1447 12/12/13 0015 12/12/13 0815 12/13/13 0535  NA 139 135* 137 138 141  K 4.3 3.7 3.4* 4.1 4.2  CL 101 98 99 105 108  CO2 24 23  25 24 24   GLUCOSE 117* 101* 86 95 98  BUN 20 27* 29* 31* 27*  CREATININE 1.72* 1.25 1.34 1.24 0.94  CALCIUM 10.3 10.1 10.2 9.8 9.7  MG  --   --   --  1.9  --     Liver Function Tests:  Recent Labs Lab 12/12/13 0015  AST 27  ALT 27  ALKPHOS 52  BILITOT <0.2*  PROT 7.4  ALBUMIN 3.2*   No results found for this basename: LIPASE, AMYLASE,  in the last 168 hours No results found for this basename: AMMONIA,  in the last 168 hours  CBC:  Recent Labs Lab 12/12/13 0015 12/12/13 0815 12/12/13 1456 12/13/13 0535 12/14/13 0337 12/15/13 0642  WBC 7.2 6.4 6.3 8.2 8.4 8.6  NEUTROABS 3.4  --   --   --  3.9  --   HGB 7.4* 6.9* 6.7* 8.7* 8.2* 8.9*  HCT 22.6* 20.8* 20.8* 26.6* 25.1* 27.4*  MCV 83.7 83.5 84.6 83.4 83.1 84.6  PLT 404* 376 420* 358 357 375    Cardiac Enzymes: No results found for this basename: CKTOTAL, CKMB, CKMBINDEX, TROPONINI,  in the last 168 hours  Lipid Panel: No results found for this basename: CHOL, TRIG, HDL, CHOLHDL, VLDL, LDLCALC,  in the last 168 hours  CBG:  Recent Labs Lab 12/08/13 2151 12/11/13 1417 12/12/13 0008  GLUCAP 116* 86 87    Microbiology: Results for orders placed during the hospital encounter of 10/24/13  CULTURE, BLOOD (ROUTINE  X 2)     Status: None   Collection Time    10/24/13  6:45 PM      Result Value Ref Range Status   Specimen Description BLOOD LEFT ARM   Final   Special Requests BOTTLES DRAWN AEROBIC AND ANAEROBIC 5CC   Final   Culture  Setup Time     Final   Value: 10/25/2013 01:02     Performed at [facility]   Culture     Final   Value: NO GROWTH 5 DAYS     Performed at [facility]   Report Status 10/31/2013 FINAL   Final  CULTURE, BLOOD (ROUTINE X 2)     Status: None   Collection Time    10/24/13  7:17 PM      Result Value Ref Range Status   Specimen Description BLOOD RIGHT FOREARM   Final   Special Requests BOTTLES DRAWN AEROBIC AND ANAEROBIC 5CC   Final   Culture  Setup Time     Final    Value: 10/25/2013 01:04     Performed at [facility]   Culture     Final   Value: NO GROWTH 5 DAYS     Performed at [facility]   Report Status 10/31/2013 FINAL   Final  URINE CULTURE     Status: None   Collection Time    10/25/13  4:10 AM      Result Value Ref Range Status   Specimen Description URINE, CLEAN CATCH   Final   Special Requests Normal   Final   Culture  Setup Time     Final   Value: 10/25/2013 08:05     Performed at [facility]     Final   Value: >=100,000 COLONIES/ML     Performed at [facility]   Culture     Final   Value: Multiple bacterial morphotypes present, none predominant. Suggest appropriate recollection if clinically indicated.     Performed at [facility]   Report Status 10/26/2013 FINAL   Final  GRAM STAIN     Status: None   Collection Time    10/25/13  4:11 AM      Result Value Ref Range Status   Specimen Description URINE, CLEAN CATCH   Final   Special Requests Normal   Final   Gram Stain     Final   Value: [id number]     WBC PRESENT,BOTH PMN AND MONONUCLEAR     Multiple bacterial morphotypes present, none predominant. Suggest appropriate recollection if clinically indicated.     YEAST     Results Called toZelphia Cairo RN [phone] [address]   Report Status 10/25/2013 FINAL   Final    Coagulation Studies: No results found for this basename: LABPROT, INR,  in the last 72 hours  Imaging: No results found.  Medications:  Scheduled: . atorvastatin  80 mg Oral Daily  . chlorhexidine  15 mL Mouth/Throat BID  . clopidogrel  75 mg Oral Q breakfast  . docusate sodium  100 mg Oral BID  . feeding supplement (ENSURE COMPLETE)  237 mL Oral TID WC  . folic acid  1 mg Oral Daily  . iron polysaccharides  150 mg Oral Daily  . levETIRAcetam  1,500 mg Oral BID  . LORazepam  1 mg Intravenous Once  . multivitamin with minerals  1 tablet Oral Daily  . polyethylene glycol  17 g Oral  Daily  .  thiamine  100 mg Oral Daily  . tiotropium  18 mcg Inhalation Daily    Assessment/Plan: No further seizures. Patient controlled on Keppra at current dose. Seems to be tolerating increase well. Looking to be D/c'd to SNF  Recommendations:  1. Continue Keppra at current dose.  2. Continue seizure precautions    LOS: 4 days   Etta Quill PA-C [facility] [phone]  12/15/2013, 9:29 AM

## 2013-12-15 NOTE — Progress Notes (Signed)
Report called to [facility]

## 2013-12-15 NOTE — Discharge Summary (Signed)
Name: [name] MRN: [id number] DOB: 1951/08/07 63 y.o. PCP: Corky Sox, MD  Date of Admission: 12/11/2013 11:02 PM Date of Discharge: 12/15/2013 Attending Physician: Bartholomew Crews, MD  Discharge Diagnosis: 1. Seizures  2. HTN  3. 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 Protein-calorie malnutrition, severe  5. Anemia, chronic disease and iron deficiency  6. CKD 2 (baseline Creatinine around 1) with elevated creatinine resolved  7. Hypokalemia, resolved   Discharge Medications:   Medication List         albuterol 108 (90 BASE) MCG/ACT inhaler  Commonly known as:  PROVENTIL HFA;VENTOLIN HFA  Inhale 2 puffs into the lungs every 6 (six) hours as needed for wheezing.     atorvastatin 80 MG tablet  Commonly known as:  LIPITOR  Take 80 mg by mouth daily.     chlorhexidine 0.12 % solution  Commonly known as:  PERIDEX  Use as directed 15 mLs in the mouth or throat 2 (two) times daily. Use after breakfast and at bedtime:  Rinse and spit out excess, do not swallow     clopidogrel 75 MG tablet  Commonly known as:  PLAVIX  Take 1 tablet (75 mg total) by mouth daily with breakfast.     feeding supplement (ENSURE COMPLETE) Liqd  Take 237 mLs by mouth 2 (two) times daily between meals.     hydrochlorothiazide 25 MG tablet  Commonly known as:  HYDRODIURIL  Take 1 tablet (25 mg total) by mouth daily.     levETIRAcetam 500 MG tablet  Commonly known as:  KEPPRA  Take 3 tablets (1,500 mg total) by mouth 2 (two) times daily.     lisinopril 10 MG tablet  Commonly known as:  PRINIVIL,ZESTRIL  Take 1 tablet (10 mg total) by mouth daily.     multivitamin with minerals Tabs tablet  Take 1 tablet by mouth daily.     tiotropium 18 MCG inhalation capsule  Commonly known as:  SPIRIVA HANDIHALER  Place 1 capsule (18 mcg total) into inhaler and inhale daily.        Disposition and  follow-up:   [name] was discharged from [facility] in stable condition.  At the hospital follow up visit please address:  1.   -medication compliance and compliance with follow up with Neurology  -repeat BMET, CBC -Consider Fe supplementation and further work up on anemia. Colonoscopy 10/2013 with benign polyps -Diet dysphagia 3 diet   2.  Labs / imaging needed at time of follow-up: see above   3.  Pending labs/ test needing follow-up: none  Follow-up Appointments:   Discharge Instructions: Discharge Orders   Future Appointments Provider Department Dept Phone   12/28/2013 1:00 PM Garvin Fila, MD [facility] [phone]   01/10/2014 1:15 PM Corky Sox, MD [facility] [phone]   Future Orders Complete By Expires   Diet - low sodium heart healthy  As directed    Discharge instructions  As directed    Driving Restrictions  As directed    Increase activity slowly  As directed       Consultations: Treatment Team:  Catarina Hartshorn, MD  Procedures Performed:  Ct Head Wo Contrast  12/02/2013   CLINICAL DATA:  Seizure like activity.  EXAM: CT HEAD WITHOUT CONTRAST  TECHNIQUE: Contiguous axial images were obtained from the base of the skull through the vertex without  intravenous contrast.  COMPARISON:  MRI 11/23/2013. Multiple previous examinations as far back as 2012.  FINDINGS: There is old infarction at the inferior cerebellum on the right. There are extensive chronic ischemic changes throughout the hemispheric white matter bilaterally. No sign of acute infarction, mass lesion, hemorrhage, hydrocephalus or extra-axial collection. There is chronic opacification of the left maxillary sinus. No acute calvarial lesion.  IMPRESSION: No acute finding by CT. Old right cerebellar infarction. Extensive chronic ischemic changes throughout the cerebral hemispheric white matter.   Electronically Signed   By: Nelson Chimes  M.D.   On: 12/02/2013 15:38   Ct Head Wo Contrast  11/23/2013   CLINICAL DATA:  Code stroke.  Left-sided weakness.  EXAM: CT HEAD WITHOUT CONTRAST  TECHNIQUE: Contiguous axial images were obtained from the base of the skull through the vertex without contrast.  COMPARISON:  CT head 10/31/2013.  MR head 11/01/2013.  FINDINGS: No evidence for acute infarction, hemorrhage, mass lesion, hydrocephalus, or extra-axial fluid. Premature for age cerebral and cerebellar atrophy. Chronic right cerebellar infarct. Chronic left parietal infarct. Chronic medial right frontal infarct. No definite acute cerebral ischemia. Chronic microvascular ischemic change. Calvarium intact. Vascular calcification. No CT signs of proximal vascular thrombosis.  IMPRESSION: Chronic changes as described. There is little apparent progression of disease when compared with prior CT or MR.  Critical Value/emergent results were called by telephone at the time of interpretation on 11/23/2013 at 2:59 PM to Dr. Stroke neurologist, who verbally acknowledged these results.   Electronically Signed   By: Rolla Flatten M.D.   On: 11/23/2013 15:00   Mr [name] Wo Contrast  11/23/2013   CLINICAL DATA:  Stroke  EXAM: MRA HEAD WITHOUT CONTRAST  TECHNIQUE: Angiographic images of the Circle of Willis were obtained using MRA technique without intravenous contrast.  COMPARISON:  Prior MRI performed earlier on the same day as well as previous MRA from 12/05/2012.  FINDINGS: Anterior circulation: Normal flow related enhancement of the included cervical, petrous, cavernous and supra clinoid internal carotid arteries. Multi focal atherosclerotic irregularity seen within the cavernous segments of the internal carotid arteries bilaterally, left greater than right. Stenoses within the cavernous segment of the left internal carotid measure up to 60-70% at its most narrow point (series 3, image 63). Overall, these findings are not significantly changed relative to prior  MRA. Patent anterior communicating artery. Patent flow seen within the anterior cerebral and middle cerebral arteries bilaterally. Multi focal irregularity within the M1 and A2 segments bilaterally are grossly stable relative to prior study, likely related to underlying atheromatous disease.  Posterior circulation: Left vertebral artery is dominant and widely patent to the level of the vertebrobasilar junction. There is multi focal irregularity with occlusion of the distal right vertebral artery proximal to the vertebrobasilar junction, unchanged. Posterior inferior cerebellar arteries are within normal limits. Basilar artery is patent, with normal flow related enhancement of the main branch vessels. Normal flow related enhancement of the posterior cerebral arteries.  No large vessel occlusion, hemodynamically significant stenosis, abnormal luminal irregularity, aneurysm within the anterior nor posterior circulation.  IMPRESSION: 1. Multi focal irregularity within the cavernous segments of the internal carotid arteries bilaterally, left greater than right. 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 Overall, these findings are not significantly changed relative to prior MRA from 12/05/2012. 2. Occlusion of the distal right vertebral artery, unchanged. The left vertebral artery is dominant and widely patent to the level of  the vertebrobasilar junction. 3. Scattered multi focal atherosclerotic irregularity within the anterior and middle cerebral artery branches bilaterally, left greater than right. Findings are grossly stable relative to prior MRA from 12/05/2012. 4. No intracranial aneurysm   Electronically Signed   By: Jeannine Boga M.D.   On: 11/23/2013 22:46   [name]  11/23/2013   CLINICAL DATA:  Recent right anterior cerebral artery stroke. Worsening left-sided weakness.  EXAM: MRI HEAD WITHOUT CONTRAST  TECHNIQUE:  Multiplanar, multiecho pulse sequences of the brain and surrounding structures were obtained without intravenous contrast.  COMPARISON:  Head CT same day.  MRI 11/01/2013.  FINDINGS: Diffusion imaging does not show any acute infarction. Changes related to subacute infarction in the right anterior cerebral artery territory are evident. No evidence of extension of the infarction.  Old cerebellar infarction on the right appears the same. Micro hemorrhages in the cerebellum appear the same and are chronic. Absent flow in the right vertebral artery again noted. Old infarction affects the right lateral medulla.  The cerebral hemispheres elsewhere show chronic small vessel disease within the white matter. Few scattered micro hemorrhages are unchanged. These are old. Old infarction in the left basal ganglia/ external capsule region. Old small vessel thalamic infarctions. No mass lesion, acute hemorrhage, hydrocephalus or extra-axial collection. No pituitary mass. Chronic opacification of the left maxillary sinus again noted.  IMPRESSION: No evidence of new infarction or extension of the anterior cerebral artery infarction on the right. Subacute infarction in that vascular territory.  Old right cerebellar and right lateral medulla infarction. Chronic small-vessel changes elsewhere throughout the brain.   Electronically Signed   By: Nelson Chimes M.D.   On: 11/23/2013 18:53   Admission HPI:   Chief Complaint: seizures  History of Present Illness: [name] is a 63 yo AA male with PMH of recent R ACA stroke (March 2015), HTN, COPD, and prostate cancer (s/p resection, follows with Dr Janice Norrie) who was recently discharged from the hospital on 12/05/13 after workup for new onset seizures. At that time he was started on Keppra 500mg  BID and discharged home to live with cousin [name]). [name] is present with him today and reports since his return home he has had 6 episodes of seizure like activity. She brought him to  the ED to be evaluated on 12/08/13 and then again on 12/11/13 he was discharged home from the ED on both of these occasions. On 12/11/13 he was loaded with 1g of IV Keppra before leaving as he has not taken his Keppra since hospital discharge as he gets his medications from a mail order pharmacy. Despite the IV load of Keppra [name] notes that after he got home he had a another seizure like episodes which she reports is he extends his arms and legs and becomes stiff, he is fully conscience and can speak to her during the episode. The episodes usually last from 30s to 2 mins and after which he is weaker and more fatigued. He has no associated bowel or bladder dysfunction, no tongue biting. Review of Systems:  Review of Systems  Constitutional: Negative for fever and chills.  HENT: Negative for sore throat.  Eyes: Negative for blurred vision, double vision and photophobia.  Respiratory: Negative for cough and shortness of breath.  Cardiovascular: Negative for chest pain.  Gastrointestinal: Negative for abdominal pain and diarrhea.  Genitourinary: Negative for dysuria, frequency and hematuria.  Musculoskeletal: Negative for myalgias.  Neurological: Positive for seizures and weakness. Negative for dizziness, sensory  change, focal weakness (no new), loss of consciousness and headaches.  Psychiatric/Behavioral: Negative for substance abuse.   Physical Exam:  Blood pressure 92/62, pulse 93, temperature 98.5 F (36.9 C), temperature source Oral, resp. rate 20, SpO2 99.00%.  Physical Exam  Nursing note and vitals reviewed.  Constitutional: He is oriented to person, place, and time. No distress.  Thin  HENT:  Head: Normocephalic and atraumatic.  Eyes: EOM are normal. Pupils are equal, round, and reactive to light.  Cardiovascular: Normal rate, regular rhythm, normal heart sounds and intact distal pulses.  Pulmonary/Chest: Effort normal and breath sounds normal. No respiratory distress. He has no  wheezes.  Abdominal: Soft. Bowel sounds are normal. There is no tenderness. There is no rebound.  Musculoskeletal: He exhibits no edema.  ~4cm nodule on anterior left tibular head below patella  Neurological: He is alert and oriented to person, place, and time.  5/5 RUE, grip strength, and RLE strength 2-3/5 LUE and LLE strength, 3+/5 left grip strength. Decreased left shoulder shrug otherwise CN2-12 grossly intact  Skin: Skin is warm and dry.     Hospital Course by problem list: 1. Seizures  2. HTN  3. 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 Protein-calorie malnutrition, severe  5. Anemia, chronic disease and iron deficiency  6. CKD 2 (baseline Creatinine around 1) with elevated creatinine resolved  7. 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 He presented 5/5 with seizures.   1. Seizures  No further while on Keppra 1500 mg bid as was increased this admission from 1000 mg bid and patient did have seizures. It was thought that with history of seizures he was noncompliant with Keppra on admission and seizures are due to history of stroke. He will need follow up with Neurology outpatient and they will call the patient with the appointment currently no appointment until August and he had previously no showed.   2. HTN  Slightly elevated 143/75 on day of discharge but home blood pressure medications will be resumed at discharge. HCTZ 25 mg daily and Lisinopril 10 daily.   3. Chronic medical conditions (i.e COPD, HLD, stroke)  Stable. Continued home medications   4. Protein-calorie malnutrition, severe  Encourage oral ntake and oral supplementation   5. Anemia of chronic disease and iron deficiency  CBC stable at discharge but trended down to 6.7 this admission and he  was transfused 2 units of blood.  UA negative for blood 12/02/13.   Consider Fe supplementation at discharge and repeat CBC. We held Lovenox and placed patient on compression devices    6. CKD2  Creatinine elevated on admission 1.34 given NS 75 cc/hr and creatinine normalized.    7. hypokalemia, resolved  Replaced potassium this admission and normalized prior to discharge.   Diet dysphagia 3 diet    Discharge Vitals:   BP 143/75  Pulse 73  Temp(Src) 97.9 F (36.6 C) (Oral)  Resp 20  Ht 5\' 8"  (1.727 m)  Wt 167 lb 1.7 oz (75.8 kg)  BMI 25.41 kg/m2  SpO2 97%  Discharge physical exam:  General: resting in bed, NAD  HEENT: [name]  Cardiac: RRR, no rubs, murmurs or gallops  Pulm: clear to auscultation bilaterally, no wheezes, rales, or rhonchi  Abd: soft, nontender, nondistended, BS present  Ext: warm and well perfused, no  pedal edema  Neuro: alert and oriented X3, cranial nerves II-XII grossly intact, left hemiparesis from prior stroke and dysarthria at baseline sequele of prior stroke.   Discharge Labs:  Results for orders placed during the hospital encounter of 12/11/13 (from the past 24 hour(s))  CBC     Status: Abnormal   Collection Time    12/15/13  6:42 AM      Result Value Ref Range   WBC 8.6  4.0 - 10.5 K/uL   RBC 3.24 (*) 4.22 - 5.81 MIL/uL   Hemoglobin 8.9 (*) 13.0 - 17.0 g/dL   HCT 27.4 (*) 39.0 - 52.0 %   MCV 84.6  78.0 - 100.0 fL   MCH 27.5  26.0 - 34.0 pg   MCHC 32.5  30.0 - 36.0 g/dL   RDW 14.6  11.5 - 15.5 %   Platelets 375  150 - 400 K/uL   Results for [name], [name] (MRN [id number]) as of 12/15/2013 09:42  Ref. Range 12/11/2013 14:47 12/12/2013 00:15 12/12/2013 08:15 12/12/2013 14:56 12/13/2013 05:35  Sodium Latest Range: 137-147 mEq/L 135 (L) 137 138  141  Potassium Latest Range: 3.7-5.3 mEq/L 3.7 3.4 (L) 4.1  4.2  Chloride Latest Range: 96-112 mEq/L 98 99 105  108  CO2 Latest Range: 19-32 mEq/L 23 25 24  24   BUN Latest Range: 6-23 mg/dL 27 (H)  29 (H) 31 (H)  27 (H)  Creatinine Latest Range: 0.50-1.35 mg/dL 1.25 1.34 1.24  0.94  Calcium Latest Range: 8.4-10.5 mg/dL 10.1 10.2 9.8  9.7  GFR calc non Af Amer Latest Range: >90 mL/min 59 (L) 54 (L) 60 (L)  86 (L)  GFR calc Af Amer Latest Range: >90 mL/min 69 (L) 63 (L) 69 (L)  >90  Glucose Latest Range: 70-99 mg/dL 101 (H) 86 95  98  Magnesium Latest Range: 1.5-2.5 mg/dL   1.9    Alkaline Phosphatase Latest Range: 39-117 U/L  52     Albumin Latest Range: 3.5-5.2 g/dL  3.2 (L)     Uric Acid, Serum Latest Range: 4.0-7.8 mg/dL    6.4   AST Latest Range: 0-37 U/L  27     ALT Latest Range: 0-53 U/L  27     Total Protein Latest Range: 6.0-8.3 g/dL  7.4     Total Bilirubin Latest Range: 0.3-1.2 mg/dL  <0.2 (L)     Iron Latest Range: 42-135 ug/dL    27 (L)   UIBC Latest Range: 125-400 ug/dL    320   TIBC Latest Range: 215-435 ug/dL    347   Saturation Ratios Latest Range: 20-55 %    8 (L)   Ferritin Latest Range: 22-322 ng/mL    16 (L)   Folate No range found    >20.0   Vitamin B-12 Latest Range: 211-911 pg/mL    552    Results for ZACCHARY, POMPEI (MRN [id number]) as of 12/15/2013 09:42  Ref. Range 12/12/2013 08:15 12/12/2013 14:56 12/13/2013 05:35 12/14/2013 03:37 12/15/2013 06:42  WBC Latest Range: 4.0-10.5 K/uL 6.4 6.3 8.2 8.4 8.6  RBC Latest Range: 4.22-5.81 MIL/uL 2.49 (L) 2.46 (L) 3.19 (L) 3.02 (L) 3.24 (L)  Hemoglobin Latest Range: 13.0-17.0 g/dL 6.9 (LL) 6.7 (LL) 8.7 (L) 8.2 (L) 8.9 (L)  HCT Latest Range: 39.0-52.0 % 20.8 (L) 20.8 (L) 26.6 (L) 25.1 (L) 27.4 (L)  MCV Latest Range: 78.0-100.0 fL 83.5 84.6 83.4 83.1 84.6  MCH Latest Range: 26.0-34.0 pg 27.7 27.2 27.3 27.2 27.5  MCHC Latest Range:  30.0-36.0 g/dL 33.2 32.2 32.7 32.7 32.5  RDW Latest Range: 11.5-15.5 % 14.0 14.0 14.4 14.8 14.6  Platelets Latest Range: 150-400 K/uL 376 420 (H) 358 357 375  Neutrophils Relative % Latest Range: 43-77 %    46   Lymphocytes Relative Latest Range: 12-46 %    42   Monocytes Relative Latest Range: 3-12  %    10   Eosinophils Relative Latest Range: 0-5 %    2   Basophils Relative Latest Range: 0-1 %    0   NEUT# Latest Range: 1.7-7.7 K/uL    3.9   Lymphocytes Absolute Latest Range: 0.7-4.0 K/uL    3.5   Monocytes Absolute Latest Range: 0.1-1.0 K/uL    0.8   Eosinophils Absolute Latest Range: 0.0-0.7 K/uL    0.2   Basophils Absolute Latest Range: 0.0-0.1 K/uL    0.0   RBC. Latest Range: 4.22-5.81 MIL/uL  2.41 (L)     Retic Ct Pct Latest Range: 0.4-3.1 %  1.3     Retic Count, Manual Latest Range: 19.0-186.0 K/uL  31.3        Signed: Cresenciano Genre, MD 12/15/2013, 1:35 PM   Time Spent on Discharge: 30 minutes Services Ordered on Discharge: PT, OT aggressive  Equipment Ordered on Discharge: none

## 2013-12-15 NOTE — Telephone Encounter (Signed)
Lenise Arena called from [facility] with [facility] and states pt has been in the hospital with seizures and stroke, I advised her he had an apt 12/04/13 and she states she knew that she made that apt I ask the reason for the no show she states she doesn't know but she is scheduling for a new one. Please page Traci concerning getting pt in before Aug that is showing next availability. Thanks

## 2013-12-21 ENCOUNTER — Non-Acute Institutional Stay (SKILLED_NURSING_FACILITY): Payer: Medicaid Other | Admitting: Internal Medicine

## 2013-12-21 ENCOUNTER — Encounter: Payer: Self-pay | Admitting: Internal Medicine

## 2013-12-21 DIAGNOSIS — I10 Essential (primary) hypertension: Secondary | ICD-10-CM

## 2013-12-21 DIAGNOSIS — R531 Weakness: Secondary | ICD-10-CM

## 2013-12-21 DIAGNOSIS — D638 Anemia in other chronic diseases classified elsewhere: Secondary | ICD-10-CM

## 2013-12-21 DIAGNOSIS — R5381 Other malaise: Secondary | ICD-10-CM

## 2013-12-21 DIAGNOSIS — R569 Unspecified convulsions: Secondary | ICD-10-CM

## 2013-12-21 DIAGNOSIS — J449 Chronic obstructive pulmonary disease, unspecified: Secondary | ICD-10-CM

## 2013-12-21 DIAGNOSIS — M6281 Muscle weakness (generalized): Secondary | ICD-10-CM

## 2013-12-21 DIAGNOSIS — R5383 Other fatigue: Secondary | ICD-10-CM

## 2013-12-21 NOTE — Progress Notes (Signed)
Patient ID: [name], male   DOB: 01/25/1951, 63 y.o.   MRN: [id number]    Armandina Gemma living [place]   PCP: Luanne Bras, MD  Code Status: full code  Allergies  Allergen Reactions  . Aspirin Hives  . Penicillins Hives  . Other Other (See Comments)    Epidural Other(seizure)    Chief Complaint: new admission  HPI:  63 y/o male patient is here for STR after hospital admission from 12/11/13-12/15/13 with seizures. He has history of stroke, copd, HTN, prostate cancer s/p resection. He is seen in his room. He is in no distress. No seizure activity noted in facility. He complaints of pain in right toe nail for few days. He feels weak appetite is fair. No other concerns. Has been working with therapy team  Review of Systems:  Constitutional: Negative for fever, chills and diaphoresis.  HENT: Negative for congestion, hearing loss and sore throat.   Eyes: Negative for eye pain, blurred vision, double vision and discharge.  Respiratory: Negative for cough, sputum production, shortness of breath and wheezing.   Cardiovascular: Negative for chest pain, palpitations, orthopnea and leg swelling.  Gastrointestinal: Negative for heartburn, nausea, vomiting, abdominal pain, diarrhea and constipation.  Genitourinary: Negative for dysuria, urgency, frequency Musculoskeletal: Negative for back pain, falls. Has pain in his right toe nail Skin: Negative for itching and rash.  Neurological: Negative for dizziness, tingling, new focal weakness and headaches.  Psychiatric/Behavioral: Negative for depression and memory loss. The patient is not nervous/anxious.     Past Medical History  Diagnosis Date  . Hypertension 05/18/2007  . COPD 05/18/2007  . Elevated PSA 05/25/2007  . Prostate cancer   . Seizures 12/05/2013; 12/08/2013; 12/11/2013    new onset; recurrent/notes 12/12/2013  . 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 Stroke 12/05/2012  . Cerebral embolism with cerebral infarction     residual  left sided weakness/notes 12/12/2013  . Protein-calorie malnutrition, severe     [name] 12/12/2013  . Chronic kidney disease (CKD), stage II (mild)     [name] 12/12/2013   Past Surgical History  Procedure Laterality Date  . 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 Multiple extractions with alveoloplasty N/A 10/23/2013    Procedure: Extraction of tooth #'s 1,2,3,4,5,6,7,11,12,13,14,15,16,20,21,22,23,24,27,28,29,32 with alveoloplasty and bilateral maxillary tuberosity reductions;  Surgeon: Lenn Cal, DDS;  Location: [facility];  Service: Oral Surgery;  Laterality: N/A;  . Tibia fracture surgery Right     pin  . Tonsillectomy     Social History:   reports that he has been smoking Cigarettes.  He has a 20 pack-year smoking history. He has never used smokeless tobacco. He reports that he does not drink alcohol or use illicit drugs.  Family History  Problem Relation Age of Onset  . Liver disease Mother   . Heart attack Mother 54  . Colon cancer Neg Hx     Medications: Patient's Medications  New Prescriptions   No medications on file  Previous Medications   ALBUTEROL (PROVENTIL HFA;VENTOLIN HFA) 108 (90 BASE) MCG/ACT INHALER    Inhale 2 puffs into the lungs every 6 (six) hours as needed for wheezing.   ATORVASTATIN (LIPITOR) 80 MG TABLET    Take 80 mg by mouth daily.   CHLORHEXIDINE (PERIDEX) 0.12 % SOLUTION    Use as directed 15 mLs in the mouth or throat 2 (two) times daily. Use after breakfast and at bedtime:  Rinse and spit out excess, do not swallow  CLOPIDOGREL (PLAVIX) 75 MG TABLET    Take 1 tablet (75 mg total) by mouth daily with breakfast.   FEEDING SUPPLEMENT, ENSURE COMPLETE, (ENSURE COMPLETE) LIQD    Take 237 mLs by mouth 2 (two) times daily between meals.   HYDROCHLOROTHIAZIDE (HYDRODIURIL) 25 MG TABLET    Take 1 tablet (25 mg total) by mouth daily.   LEVETIRACETAM (KEPPRA) 500 MG TABLET    Take 3 tablets (1,500 mg total) by mouth 2 (two) times daily.   LISINOPRIL  (PRINIVIL,ZESTRIL) 10 MG TABLET    Take 1 tablet (10 mg total) by mouth daily.   MULTIPLE VITAMIN (MULTIVITAMIN WITH MINERALS) TABS TABLET    Take 1 tablet by mouth daily.   TIOTROPIUM (SPIRIVA HANDIHALER) 18 MCG INHALATION CAPSULE    Place 1 capsule (18 mcg total) into inhaler and inhale daily.  Modified Medications   No medications on file  Discontinued Medications   No medications on file    Physical Exam: Filed Vitals:   12/21/13 1020  BP: 121/64  Pulse: 76  Temp: 97.8 F (36.6 C)  Resp: 18  Height: 5\' 8"  (1.727 m)  Weight: 154 lb (69.854 kg)  SpO2: 95%   General- elderly male in no acute distress Head- atraumatic, normocephalic Eyes- PERRLA, EOMI, no pallor, no icterus, no discharge Neck- no lymphadenopathy, no jugular vein distension Throat- moist mucus membrane Cardiovascular- normal s1,s2, no murmurs/ rubs/ gallops Respiratory- bilateral clear to auscultation, no wheeze, no rhonchi, no crackles, no use of accessory muscles Abdomen- bowel sounds present, soft, non tender Musculoskeletal- left hemiparesis (from old cva), able to move right extremities, no leg edema, ingrown toe nail in right great toe, no signs of infection noted, also has hypertrophied nails in both legs Neurological- no new focal deficit, has dysarthria Skin- warm and dry Psychiatry- alert and oriented to person, place and time, normal mood and affect   Labs reviewed: Basic Metabolic Panel:  Recent Labs  10/24/13 2215 10/24/13 2245  12/02/13 1805  12/12/13 0015 12/12/13 0815 12/13/13 0535  NA  --  138  < >  --   < > 137 138 141  K  --  3.5*  < >  --   < > 3.4* 4.1 4.2  CL  --  101  < >  --   < > 99 105 108  CO2  --  25  < >  --   < > 25 24 24   GLUCOSE  --  98  < >  --   < > 86 95 98  BUN  --  13  < >  --   < > 29* 31* 27*  CREATININE  --  1.13  < >  --   < > 1.34 1.24 0.94  CALCIUM  --  9.4  < >  --   < > 10.2 9.8 9.7  MG  --  1.6  --  1.8  --   --  1.9  --   PHOS 2.9  --   --   --   --    --   --   --   < > = values in this interval not displayed. Liver Function Tests:  Recent Labs  11/23/13 1441 11/24/13 0536 12/12/13 0015  AST 34 27 27  ALT 30 24 27   ALKPHOS 71 63 52  BILITOT 0.2* 0.3 <0.2*  PROT 8.0 7.0 7.4  ALBUMIN 3.4* 3.1* 3.2*    Recent Labs  10/24/13 2245  LIPASE 14  No results found for this basename: AMMONIA,  in the last 8760 hours CBC:  Recent Labs  11/23/13 1441  12/12/13 0015  12/13/13 0535 12/14/13 0337 12/15/13 0642  WBC 6.8  < > 7.2  < > 8.2 8.4 8.6  NEUTROABS 2.6  --  3.4  --   --  3.9  --   HGB 9.1*  < > 7.4*  < > 8.7* 8.2* 8.9*  HCT 27.9*  < > 22.6*  < > 26.6* 25.1* 27.4*  MCV 85.1  < > 83.7  < > 83.4 83.1 84.6  PLT 232  < > 404*  < > 358 357 375  < > = values in this interval not displayed. Cardiac Enzymes:  Recent Labs  10/24/13 2245 12/02/13 1805  CKTOTAL 162 187  TROPONINI  --  <0.30   BNP: No components found with this basename: POCBNP,  CBG:  Recent Labs  12/08/13 2151 12/11/13 1417 12/12/13 0008  GLUCAP 116* 86 87   12/20/13 wbc 7.9, hb 8.5, hct 25.4, plt 357, na 137, k 3.9, glu 99, bun 23, cr 1.06, ca 9.7  Radiological Exams: Ct Head Wo Contrast  12/02/2013   CLINICAL DATA:  Seizure like activity.  EXAM: CT HEAD WITHOUT CONTRAST  TECHNIQUE: Contiguous axial images were obtained from the base of the skull through the vertex without intravenous contrast.  COMPARISON:  MRI 11/23/2013. Multiple previous examinations as far back as 2012.  FINDINGS: There is old infarction at the inferior cerebellum on the right. There are extensive chronic ischemic changes throughout the hemispheric white matter bilaterally. No sign of acute infarction, mass lesion, hemorrhage, hydrocephalus or extra-axial collection. There is chronic opacification of the left maxillary sinus. No acute calvarial lesion.  IMPRESSION: No acute finding by CT. Old right cerebellar infarction. Extensive chronic ischemic changes throughout the cerebral  hemispheric white matter.   Electronically Signed   By: Nelson Chimes M.D.   On: 12/02/2013 15:38   Ct Head Wo Contrast  11/23/2013   CLINICAL DATA:  Code stroke.  Left-sided weakness.  EXAM: CT HEAD WITHOUT CONTRAST  TECHNIQUE: Contiguous axial images were obtained from the base of the skull through the vertex without contrast.  COMPARISON:  CT head 10/31/2013.  MR head 11/01/2013.  FINDINGS: No evidence for acute infarction, hemorrhage, mass lesion, hydrocephalus, or extra-axial fluid. Premature for age cerebral and cerebellar atrophy. Chronic right cerebellar infarct. Chronic left parietal infarct. Chronic medial right frontal infarct. No definite acute cerebral ischemia. Chronic microvascular ischemic change. Calvarium intact. Vascular calcification. No CT signs of proximal vascular thrombosis.  IMPRESSION: Chronic changes as described. There is little apparent progression of disease when compared with prior CT or MR.  Critical Value/emergent results were called by telephone at the time of interpretation on 11/23/2013 at 2:59 PM to Dr. Stroke neurologist, who verbally acknowledged these results.   Electronically Signed   By: Rolla Flatten M.D.   On: 11/23/2013 15:00   Mr [name] Wo Contrast  11/23/2013   CLINICAL DATA:  Stroke  EXAM: MRA HEAD WITHOUT CONTRAST  TECHNIQUE: Angiographic images of the Circle of Willis were obtained using MRA technique without intravenous contrast.  COMPARISON:  Prior MRI performed earlier on the same day as well as previous MRA from 12/05/2012.  FINDINGS: Anterior circulation: Normal flow related enhancement of the included cervical, petrous, cavernous and supra clinoid internal carotid arteries. Multi focal atherosclerotic irregularity seen within the cavernous segments of the internal carotid arteries bilaterally, left greater than right. Stenoses  within the cavernous segment of the left internal carotid measure up to 60-70% at its most narrow point (series 3, image 63).  Overall, these findings are not significantly changed relative to prior MRA. Patent anterior communicating artery. Patent flow seen within the anterior cerebral and middle cerebral arteries bilaterally. Multi focal irregularity within the M1 and A2 segments bilaterally are grossly stable relative to prior study, likely related to underlying atheromatous disease.  Posterior circulation: Left vertebral artery is dominant and widely patent to the level of the vertebrobasilar junction. There is multi focal irregularity with occlusion of the distal right vertebral artery proximal to the vertebrobasilar junction, unchanged. Posterior inferior cerebellar arteries are within normal limits. Basilar artery is patent, with normal flow related enhancement of the main branch vessels. Normal flow related enhancement of the posterior cerebral arteries.  No large vessel occlusion, hemodynamically significant stenosis, abnormal luminal irregularity, aneurysm within the anterior nor posterior circulation.  IMPRESSION: 1. Multi focal irregularity within the cavernous segments of the internal carotid arteries bilaterally, left greater than right. 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 Overall, these findings are not significantly changed relative to prior MRA from 12/05/2012. 2. Occlusion of the distal right vertebral artery, unchanged. The left vertebral artery is dominant and widely patent to the level of the vertebrobasilar junction. 3. Scattered multi focal atherosclerotic irregularity within the anterior and middle cerebral artery branches bilaterally, left greater than right. Findings are grossly stable relative to prior MRA from 12/05/2012. 4. No intracranial aneurysm   Electronically Signed   By: Jeannine Boga M.D.   On: 11/23/2013 22:46   [name]  11/23/2013   CLINICAL DATA:  Recent right anterior cerebral artery stroke. Worsening  left-sided weakness.  EXAM: MRI HEAD WITHOUT CONTRAST  TECHNIQUE: Multiplanar, multiecho pulse sequences of the brain and surrounding structures were obtained without intravenous contrast.  COMPARISON:  Head CT same day.  MRI 11/01/2013.  FINDINGS: Diffusion imaging does not show any acute infarction. Changes related to subacute infarction in the right anterior cerebral artery territory are evident. No evidence of extension of the infarction.  Old cerebellar infarction on the right appears the same. Micro hemorrhages in the cerebellum appear the same and are chronic. Absent flow in the right vertebral artery again noted. Old infarction affects the right lateral medulla.  The cerebral hemispheres elsewhere show chronic small vessel disease within the white matter. Few scattered micro hemorrhages are unchanged. These are old. Old infarction in the left basal ganglia/ external capsule region. Old small vessel thalamic infarctions. No mass lesion, acute hemorrhage, hydrocephalus or extra-axial collection. No pituitary mass. Chronic opacification of the left maxillary sinus again noted. IMPRESSION: No evidence of new infarction or extension of the anterior cerebral artery infarction on the right. Subacute infarction in that vascular territory.  Old right cerebellar and right lateral medulla infarction. Chronic small-vessel changes elsewhere throughout the brain.   Electronically Signed   By: Nelson Chimes M.D.   On: 11/23/2013 18:53     Assessment/Plan  Generalized weakness- Will have him work with physical therapy and occupational therapy team to help with gait training and muscle strengthening exercises.fall precautions. Skin care. Encourage to be out of bed. Continue nutritional supplement.  Seizure- no further episode reported. Continue keppra 1500 bid for now  Ingrown toe nail- podiatry referral provided  Hypertension- stable bp reading. Continue lisinopril and hctz at current regimen, monitor bp  readings  CVA with  hemiparesis- stable. Continue atorvastatin with plavix and bp medications. Will be working with therapy team  Anemia of chronic disease- reviewed h/h. Monitor clinically. Continue his MVI.  COPD- breathing is stable, continue spiriva and prn proventil  Family/ staff Communication: reviewed care plan with patient and nursing supervisor   Goals of care: short term rehab   Labs/tests ordered- cbc, cmp in 1 week    Blanchie Serve, MD  [place] (Monday-Friday 8 am - 5 pm) [phone] (afterhours)

## 2013-12-26 ENCOUNTER — Encounter (HOSPITAL_COMMUNITY): Payer: Self-pay | Admitting: Dentistry

## 2013-12-26 ENCOUNTER — Ambulatory Visit (HOSPITAL_COMMUNITY): Payer: Medicaid - Dental | Admitting: Dentistry

## 2013-12-26 VITALS — BP 121/78 | HR 80 | Temp 98.6°F

## 2013-12-26 DIAGNOSIS — K000 Anodontia: Secondary | ICD-10-CM

## 2013-12-26 DIAGNOSIS — K08109 Complete loss of teeth, unspecified cause, unspecified class: Secondary | ICD-10-CM

## 2013-12-26 DIAGNOSIS — C61 Malignant neoplasm of prostate: Secondary | ICD-10-CM

## 2013-12-26 NOTE — Patient Instructions (Signed)
Return to clinic as scheduled for continued upper and lower complete denture fabrication. Dr. Kulinski 

## 2013-12-26 NOTE — Progress Notes (Signed)
12/26/2013  Patient:            [name] Date of Birth:  22-Dec-1950 MRN:                [id number]  BP 121/78  Pulse 80  Temp(Src) 98.6 F (37 C) (Oral)   [name] Leibensperger presents for start of upper and lower denture fabrication. Exam: Patient is edentulous. Discussed procedures involved in upper and lower denture fabrication and prognosis for successful ability to wear dentures. Price for dentures confirmed.  Patient agrees to proceed with upper and lower denture fabrication. Procedure:  Upper and lower denture primary impressions in alginate. Lab pour. To [facility] for upper and lower denture custom tray fabrication. RTC for upper and lower denture final impressions.  Lenn Cal, DDS

## 2013-12-27 NOTE — ED Provider Notes (Signed)
  Medical screening examination/treatment/procedure(s) were performed by non-physician practitioner and as supervising physician I was immediately available for consultation/collaboration.    Carmin Muskrat, MD 12/27/13 [phone]

## 2013-12-28 ENCOUNTER — Ambulatory Visit: Payer: Self-pay | Admitting: Neurology

## 2013-12-31 NOTE — Progress Notes (Addendum)
Family has chosen [facility] [place]. Clinical social worker assisted with patient discharge to skilled nursing facility, [facility].  CSW addressed all family questions and concerns. CSW copied chart and added all important documents. CSW also set up patient transportation with Diplomatic Services operational officer. Clinical Social Worker will sign off for now as social work intervention is no longer needed.  Rhea Pink, MSW, [place]

## 2014-01-09 ENCOUNTER — Encounter (HOSPITAL_COMMUNITY): Payer: Self-pay | Admitting: Dentistry

## 2014-01-09 ENCOUNTER — Ambulatory Visit (HOSPITAL_COMMUNITY): Payer: Medicaid - Dental | Admitting: Dentistry

## 2014-01-09 VITALS — BP 119/70 | HR 83 | Temp 98.1°F

## 2014-01-09 DIAGNOSIS — Z463 Encounter for fitting and adjustment of dental prosthetic device: Secondary | ICD-10-CM

## 2014-01-09 DIAGNOSIS — K08109 Complete loss of teeth, unspecified cause, unspecified class: Secondary | ICD-10-CM

## 2014-01-09 DIAGNOSIS — K000 Anodontia: Principal | ICD-10-CM

## 2014-01-09 DIAGNOSIS — C61 Malignant neoplasm of prostate: Secondary | ICD-10-CM

## 2014-01-09 NOTE — Progress Notes (Signed)
01/09/2014  Patient:            [name] Date of Birth:  01/16/1951 MRN:                [id number]   BP 119/70  Pulse 83  Temp(Src) 98.1 F (36.7 C) (Oral)  [name] [name] presents for continued upper and lower complete denture fabrication. Procedure:  Upper and lower border molding and final impressions in Aquasil. Patient tolerated procedure well. To [facility] for custom baseplates with rims. Return to clinic for upper and lower complete denture jaw relations.  Lenn Cal, DDS

## 2014-01-09 NOTE — Patient Instructions (Signed)
Return to clinic as scheduled for continued upper and lower complete denture fabrication. Dr. Kulinski 

## 2014-01-10 ENCOUNTER — Encounter: Payer: Medicaid Other | Admitting: Internal Medicine

## 2014-01-11 ENCOUNTER — Non-Acute Institutional Stay (SKILLED_NURSING_FACILITY): Payer: Medicaid Other | Admitting: Internal Medicine

## 2014-01-11 DIAGNOSIS — R569 Unspecified convulsions: Secondary | ICD-10-CM

## 2014-01-11 DIAGNOSIS — I6350 Cerebral infarction due to unspecified occlusion or stenosis of unspecified cerebral artery: Secondary | ICD-10-CM

## 2014-01-11 DIAGNOSIS — R5383 Other fatigue: Secondary | ICD-10-CM

## 2014-01-11 DIAGNOSIS — I639 Cerebral infarction, unspecified: Secondary | ICD-10-CM

## 2014-01-11 DIAGNOSIS — R5381 Other malaise: Secondary | ICD-10-CM

## 2014-01-11 DIAGNOSIS — I10 Essential (primary) hypertension: Secondary | ICD-10-CM

## 2014-01-11 DIAGNOSIS — R531 Weakness: Secondary | ICD-10-CM

## 2014-01-11 DIAGNOSIS — J449 Chronic obstructive pulmonary disease, unspecified: Secondary | ICD-10-CM

## 2014-01-11 DIAGNOSIS — D638 Anemia in other chronic diseases classified elsewhere: Secondary | ICD-10-CM

## 2014-01-11 NOTE — Progress Notes (Signed)
Patient ID: [name], male   DOB: May 06, 1951, 63 y.o.   MRN: [id number]    Armandina Gemma living [facility]  Chief Complaint  Patient presents with  . Discharge Note    discharge visit   Allergies  Allergen Reactions  . Aspirin Hives  . Penicillins Hives  . Other Other (See Comments)    Epidural Other(seizure)   HPI:   63 y/o male patient is here for STR after hospital admission from 12/11/13-12/15/13 with seizures. He has worked well with therapy team and is stable to be discharged home with home health services. He is in no distress. No seizure activity noted in facility. No concerns this visit.  Review of Systems:  Constitutional: Negative for fever, chills and diaphoresis.  Respiratory: Negative for cough, sputum production, shortness of breath and wheezing.   Cardiovascular: Negative for chest pain, palpitations, orthopnea and leg swelling.  Gastrointestinal: Negative for heartburn, nausea, vomiting, abdominal pain, diarrhea and constipation.  Genitourinary: Negative for dysuria, urgency, frequency Musculoskeletal: Negative for back pain, falls.  Psychiatric/Behavioral: Negative for depression  Current Outpatient Prescriptions on File Prior to Visit  Medication Sig Dispense Refill  . albuterol (PROVENTIL HFA;VENTOLIN HFA) 108 (90 BASE) MCG/ACT inhaler Inhale 2 puffs into the lungs every 6 (six) hours as needed for wheezing.  8.5 g  3  . atorvastatin (LIPITOR) 80 MG tablet Take 80 mg by mouth daily.      . chlorhexidine (PERIDEX) 0.12 % solution Use as directed 15 mLs in the mouth or throat 2 (two) times daily. Use after breakfast and at bedtime:  Rinse and spit out excess, do not swallow      . clopidogrel (PLAVIX) 75 MG tablet Take 1 tablet (75 mg total) by mouth daily with breakfast.  90 tablet  0  . hydrochlorothiazide (HYDRODIURIL) 25 MG tablet Take 1 tablet (25 mg total) by mouth daily.  30 tablet  5  . levETIRAcetam (KEPPRA) 500 MG tablet Take 3 tablets (1,500 mg total) by  mouth 2 (two) times daily.  60 tablet  1  . lisinopril (PRINIVIL,ZESTRIL) 10 MG tablet Take 1 tablet (10 mg total) by mouth daily.  30 tablet  2  . Multiple Vitamin (MULTIVITAMIN WITH MINERALS) TABS tablet Take 1 tablet by mouth daily.      Marland Kitchen tiotropium (SPIRIVA HANDIHALER) 18 MCG inhalation capsule Place 1 capsule (18 mcg total) into inhaler and inhale daily.  30 capsule  5   No current facility-administered medications on file prior to visit.     Physical exam BP 123/67  Pulse 74  Temp(Src) 97.8 F (36.6 C)  Resp 18  Ht 5\' 8"  (1.727 m)  Wt 149 lb (67.586 kg)  BMI 22.66 kg/m2  SpO2 96%  General- elderly male in no acute distress Head- atraumatic, normocephalic Eyes- PERRLA, EOMI, no pallor, no icterus, no discharge Neck- no lymphadenopathy, no jugular vein distension Throat- moist mucus membrane Cardiovascular- normal s1,s2, no murmurs/ rubs/ gallops Respiratory- bilateral clear to auscultation, no wheeze, no rhonchi, no crackles, no use of accessory muscles Abdomen- bowel sounds present, soft, non tender Musculoskeletal- left hemiparesis (from old cva), able to move right extremities, no leg edema Neurological- no new focal deficit, has dysarthria Skin- warm and dry Psychiatry- alert and oriented to person, place and time, normal mood and affect  Lab Results  Component Value Date   WBC 8.6 12/15/2013   HGB 8.9* 12/15/2013   HCT 27.4* 12/15/2013   MCV 84.6 12/15/2013   PLT 375 12/15/2013  CMP     Component Value Date/Time   NA 141 12/13/2013 0535   K 4.2 12/13/2013 0535   CL 108 12/13/2013 0535   CO2 24 12/13/2013 0535   GLUCOSE 98 12/13/2013 0535   BUN 27* 12/13/2013 0535   CREATININE 0.94 12/13/2013 0535   CREATININE 1.42* 12/01/2013 1137   CALCIUM 9.7 12/13/2013 0535   PROT 7.4 12/12/2013 0015   ALBUMIN 3.2* 12/12/2013 0015   AST 27 12/12/2013 0015   ALT 27 12/12/2013 0015   ALKPHOS 52 12/12/2013 0015   BILITOT <0.2* 12/12/2013 0015   GFRNONAA 86* 12/13/2013 0535   GFRNONAA 52* 12/01/2013 1137    GFRAA >90 12/13/2013 0535   GFRAA 60 12/01/2013 1137   12/29/13 Hb 8.2, hct 24.9, wbc 7.4, plt 308  Assessment/plan  Generalized weakness- Will have him work with physical therapy at home. Will need a shower bench.  Seizure- no further episode reported. Continue keppra 1500 bid for now  Hypertension- stable bp reading. Continue lisinopril and hctz at current regimen, monitor bp readings  CVA with hemiparesis- stable. Continue atorvastatin with plavix and bp medications.   COPD- breathing is stable, continue spiriva and prn proventil  Anemia- monitor h&h at PCP office. Appointment to be made within 1-2 weeks fo discharge from the facility  To follow with his PCP as outpatient. 30 days supply on medications provided

## 2014-01-17 ENCOUNTER — Encounter (INDEPENDENT_AMBULATORY_CARE_PROVIDER_SITE_OTHER): Payer: Self-pay

## 2014-01-17 ENCOUNTER — Ambulatory Visit (HOSPITAL_COMMUNITY): Payer: Medicaid - Dental | Admitting: Dentistry

## 2014-01-17 ENCOUNTER — Encounter (HOSPITAL_COMMUNITY): Payer: Self-pay | Admitting: Dentistry

## 2014-01-17 VITALS — BP 89/59 | HR 105 | Temp 98.1°F

## 2014-01-17 DIAGNOSIS — K08109 Complete loss of teeth, unspecified cause, unspecified class: Secondary | ICD-10-CM

## 2014-01-17 DIAGNOSIS — Z463 Encounter for fitting and adjustment of dental prosthetic device: Secondary | ICD-10-CM

## 2014-01-17 DIAGNOSIS — K000 Anodontia: Principal | ICD-10-CM

## 2014-01-17 NOTE — Progress Notes (Signed)
01/17/2014  Patient:            [name] Date of Birth:  1951-07-03 MRN:                [id number]  BP 89/59  Pulse 105  Temp(Src) 98.1 F (36.7 C) (Oral)  [name] Sadlowski presents for continued denture fabrication. Procedure:  Upper and lower denture Jaw relations with aluwax bite registration. This was very difficult secondary to minimal patient cooperation and history of stroke with right-sided facial weakness. Patient agrees to tooth selection of 22 E, H, and 10 degree posteriors to match with Portrait A2 shade. Patient tolerated procedure well. RTC for denture wax try in.   Lenn Cal, DDS

## 2014-01-17 NOTE — Patient Instructions (Signed)
Return to clinic as scheduled for continued fabrication of upper lower complete dentures. Dr. Enrique Sack

## 2014-01-18 ENCOUNTER — Other Ambulatory Visit: Payer: Self-pay | Admitting: *Deleted

## 2014-01-18 NOTE — Telephone Encounter (Signed)
Last rx was for qty of 61. Pt was taking 1 tab BID. Requesting 3 month supply.

## 2014-01-19 ENCOUNTER — Ambulatory Visit: Payer: Self-pay | Admitting: Internal Medicine

## 2014-01-19 ENCOUNTER — Encounter: Payer: Self-pay | Admitting: Internal Medicine

## 2014-01-20 ENCOUNTER — Encounter (HOSPITAL_COMMUNITY): Payer: Self-pay | Admitting: Emergency Medicine

## 2014-01-20 ENCOUNTER — Inpatient Hospital Stay (HOSPITAL_COMMUNITY)
Admission: EM | Admit: 2014-01-20 | Discharge: 2014-01-23 | DRG: 100 | Disposition: A | Discharge status: Rehabilitation Facility | Payer: Medicaid Other | Attending: Internal Medicine | Admitting: Internal Medicine

## 2014-01-20 DIAGNOSIS — Z8673 Personal history of transient ischemic attack (TIA), and cerebral infarction without residual deficits: Secondary | ICD-10-CM

## 2014-01-20 DIAGNOSIS — R5381 Other malaise: Secondary | ICD-10-CM | POA: Diagnosis present

## 2014-01-20 DIAGNOSIS — S98139A Complete traumatic amputation of one unspecified lesser toe, initial encounter: Secondary | ICD-10-CM

## 2014-01-20 DIAGNOSIS — Z9119 Patient's noncompliance with other medical treatment and regimen: Secondary | ICD-10-CM

## 2014-01-20 DIAGNOSIS — F17200 Nicotine dependence, unspecified, uncomplicated: Secondary | ICD-10-CM | POA: Diagnosis present

## 2014-01-20 DIAGNOSIS — I129 Hypertensive chronic kidney disease with stage 1 through stage 4 chronic kidney disease, or unspecified chronic kidney disease: Secondary | ICD-10-CM | POA: Diagnosis present

## 2014-01-20 DIAGNOSIS — J449 Chronic obstructive pulmonary disease, unspecified: Secondary | ICD-10-CM | POA: Diagnosis present

## 2014-01-20 DIAGNOSIS — J4489 Other specified chronic obstructive pulmonary disease: Secondary | ICD-10-CM | POA: Diagnosis present

## 2014-01-20 DIAGNOSIS — I69959 Hemiplegia and hemiparesis following unspecified cerebrovascular disease affecting unspecified side: Secondary | ICD-10-CM

## 2014-01-20 DIAGNOSIS — N189 Chronic kidney disease, unspecified: Secondary | ICD-10-CM

## 2014-01-20 DIAGNOSIS — R569 Unspecified convulsions: Secondary | ICD-10-CM

## 2014-01-20 DIAGNOSIS — F1010 Alcohol abuse, uncomplicated: Secondary | ICD-10-CM | POA: Diagnosis present

## 2014-01-20 DIAGNOSIS — Z7902 Long term (current) use of antithrombotics/antiplatelets: Secondary | ICD-10-CM

## 2014-01-20 DIAGNOSIS — D649 Anemia, unspecified: Secondary | ICD-10-CM

## 2014-01-20 DIAGNOSIS — I69991 Dysphagia following unspecified cerebrovascular disease: Secondary | ICD-10-CM

## 2014-01-20 DIAGNOSIS — C61 Malignant neoplasm of prostate: Secondary | ICD-10-CM | POA: Diagnosis present

## 2014-01-20 DIAGNOSIS — N182 Chronic kidney disease, stage 2 (mild): Secondary | ICD-10-CM | POA: Diagnosis present

## 2014-01-20 DIAGNOSIS — Z79899 Other long term (current) drug therapy: Secondary | ICD-10-CM

## 2014-01-20 DIAGNOSIS — C7952 Secondary malignant neoplasm of bone marrow: Secondary | ICD-10-CM

## 2014-01-20 DIAGNOSIS — N170 Acute kidney failure with tubular necrosis: Secondary | ICD-10-CM | POA: Diagnosis present

## 2014-01-20 DIAGNOSIS — Z91199 Patient's noncompliance with other medical treatment and regimen due to unspecified reason: Secondary | ICD-10-CM

## 2014-01-20 DIAGNOSIS — N179 Acute kidney failure, unspecified: Secondary | ICD-10-CM

## 2014-01-20 DIAGNOSIS — I959 Hypotension, unspecified: Secondary | ICD-10-CM | POA: Diagnosis present

## 2014-01-20 DIAGNOSIS — I10 Essential (primary) hypertension: Secondary | ICD-10-CM | POA: Diagnosis present

## 2014-01-20 DIAGNOSIS — C7951 Secondary malignant neoplasm of bone: Secondary | ICD-10-CM | POA: Diagnosis present

## 2014-01-20 DIAGNOSIS — E876 Hypokalemia: Secondary | ICD-10-CM | POA: Diagnosis present

## 2014-01-20 DIAGNOSIS — D638 Anemia in other chronic diseases classified elsewhere: Secondary | ICD-10-CM | POA: Diagnosis present

## 2014-01-20 DIAGNOSIS — G40909 Epilepsy, unspecified, not intractable, without status epilepticus: Principal | ICD-10-CM | POA: Diagnosis present

## 2014-01-20 LAB — BASIC METABOLIC PANEL
BUN: 39 mg/dL — ABNORMAL HIGH (ref 6–23)
CALCIUM: 10.6 mg/dL — AB (ref 8.4–10.5)
CO2: 24 mEq/L (ref 19–32)
CREATININE: 2.81 mg/dL — AB (ref 0.50–1.35)
Chloride: 104 mEq/L (ref 96–112)
GFR calc Af Amer: 26 mL/min — ABNORMAL LOW (ref >90)
GFR, EST NON AFRICAN AMERICAN: 22 mL/min — AB (ref >90)
GLUCOSE: 99 mg/dL (ref 70–99)
Potassium: 3.4 mEq/L — ABNORMAL LOW (ref 3.7–5.3)
SODIUM: 143 meq/L (ref 137–147)

## 2014-01-20 LAB — CBC
HCT: 23.1 % — ABNORMAL LOW (ref 39.0–52.0)
HEMOGLOBIN: 7.5 g/dL — AB (ref 13.0–17.0)
MCH: 26.8 pg (ref 26.0–34.0)
MCHC: 32.5 g/dL (ref 30.0–36.0)
MCV: 82.5 fL (ref 78.0–100.0)
Platelets: 323 10*3/uL (ref 150–400)
RBC: 2.8 MIL/uL — ABNORMAL LOW (ref 4.22–5.81)
RDW: 13.4 % (ref 11.5–15.5)
WBC: 7.1 10*3/uL (ref 4.0–10.5)

## 2014-01-20 MED ORDER — LEVETIRACETAM 500 MG PO TABS: 1500.0000 mg | ORAL_TABLET | Freq: Two times a day (BID) | ORAL | Status: DC

## 2014-01-20 NOTE — ED Provider Notes (Signed)
CSN: [id number]     Arrival date & time 01/20/14  2239 History   First MD Initiated Contact with Patient 01/20/14 2329     Chief Complaint  Patient presents with  . Seizures     (Consider location/radiation/quality/duration/timing/severity/associated sxs/prior Treatment) HPI This patient is a 63 year old man with a history of seizure disorder treated with Keppra. The patient is brought in by ambulance from his home after having had a seizure while he was sitting in a chair. Patient estimates that his seizure lasted 2 minutes. Apparently, the patient's wife witnessed the episode. However, she is not available for history.  Patient reports compliance with Keppra and other medications. Denies cocaine, alcohol and illicit drug use. Denies headache. The patient has chronic left hemiparesis. He has no new neurologic symptoms.  Past Medical History  Diagnosis Date  . Hypertension 05/18/2007  . COPD 05/18/2007  . Elevated PSA 05/25/2007  . Prostate cancer   . Seizures 12/05/2013; 12/08/2013; 12/11/2013    new onset; recurrent/notes 12/12/2013  . 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 Stroke 12/05/2012  . Cerebral embolism with cerebral infarction     residual left sided weakness/notes 12/12/2013  . Protein-calorie malnutrition, severe     [name] 12/12/2013  . Chronic kidney disease (CKD), stage II (mild)     [name] 12/12/2013   Past Surgical History  Procedure Laterality Date  . 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 Multiple extractions with alveoloplasty N/A 10/23/2013    Procedure: Extraction of tooth #'s 1,2,3,4,5,6,7,11,12,13,14,15,16,20,21,22,23,24,27,28,29,32 with alveoloplasty and bilateral maxillary tuberosity reductions;  Surgeon: Lenn Cal, DDS;  Location: [place];  Service: Oral Surgery;  Laterality: N/A;  . Tibia fracture surgery Right     pin  . Tonsillectomy     Family History  Problem Relation Age of Onset  . Liver disease Mother   . Heart attack Mother 66  .  Colon cancer Neg Hx    History  Substance Use Topics  . Smoking status: Current Some Day Smoker -- 0.40 packs/day for 50 years    Types: Cigarettes  . Smokeless tobacco: Never Used     Comment: 1/2 ppd hx, now 3-4 cigs/day  . Alcohol Use: Yes     Comment: 1 beer a week, used to drink 2-3 beers a day    Review of Systems Ten point review of symptoms performed and is negative with the exception of symptoms noted above.   Allergies  Aspirin; Penicillins; and Other  Home Medications   Prior to Admission medications   Medication Sig Start Date End Date Taking? Authorizing Provider  albuterol (PROVENTIL HFA;VENTOLIN HFA) 108 (90 BASE) MCG/ACT inhaler Inhale 2 puffs into the lungs every 6 (six) hours as needed for wheezing. 10/10/13   Corky Sox, MD  atorvastatin (LIPITOR) 80 MG tablet Take 80 mg by mouth daily.    Historical Provider, MD  chlorhexidine (PERIDEX) 0.12 % solution Use as directed 15 mLs in the mouth or throat 2 (two) times daily. Use after breakfast and at bedtime:  Rinse and spit out excess, do not swallow    Historical Provider, MD  clopidogrel (PLAVIX) 75 MG tablet Take 1 tablet (75 mg total) by mouth daily with breakfast. 12/01/13   Cresenciano Genre, MD  hydrochlorothiazide (HYDRODIURIL) 25 MG tablet Take 1 tablet (25 mg total) by mouth daily. 11/09/13   Cresenciano Genre, MD  levETIRAcetam (KEPPRA) 500 MG tablet Take 3 tablets (1,500 mg total) by mouth 2 (  two) times daily.    Corky Sox, MD  lisinopril (PRINIVIL,ZESTRIL) 10 MG tablet Take 1 tablet (10 mg total) by mouth daily. 12/01/13   Cresenciano Genre, MD  Multiple Vitamin (MULTIVITAMIN WITH MINERALS) TABS tablet Take 1 tablet by mouth daily.    Historical Provider, MD  tiotropium (SPIRIVA HANDIHALER) 18 MCG inhalation capsule Place 1 capsule (18 mcg total) into inhaler and inhale daily. 10/10/13 10/10/14  Corky Sox, MD   BP 93/63  Pulse 86  Temp(Src) 98 F (36.7 C) (Oral)  Resp 19  Wt 149 lb (67.586 kg)  SpO2  100% Physical Exam Gen: well developed and well nourished appearing Head: NCAT Eyes: PERL, EOMI Nose: no epistaixis or rhinorrhea Mouth/throat: mucosa is moist and pink, edentulous, oral mucosa is dehydrated appearing Neck: supple, no stridor Lungs: CTA B, no wheezing, rhonchi or rales CV: RRR, no murmur, extremities appear well perfused.  Abd: soft, notender, nondistended Back: no ttp, no cva ttp Skin: warm and dry Ext: normal to inspection, no dependent edema Neuro: left facial weaknes, left arm and leg weakness, dysarthria (mild) - likely secondary to edentulous status.  Psyche; normal affect,  calm and cooperative.   ED Course  Procedures (including critical care time) Labs Review  Results for orders placed during the hospital encounter of 01/20/14 (from the past 24 hour(s))  CBC     Status: Abnormal   Collection Time    01/20/14 11:11 PM      Result Value Ref Range   WBC 7.1  4.0 - 10.5 K/uL   RBC 2.80 (*) 4.22 - 5.81 MIL/uL   Hemoglobin 7.5 (*) 13.0 - 17.0 g/dL   HCT 23.1 (*) 39.0 - 52.0 %   MCV 82.5  78.0 - 100.0 fL   MCH 26.8  26.0 - 34.0 pg   MCHC 32.5  30.0 - 36.0 g/dL   RDW 13.4  11.5 - 15.5 %   Platelets 323  150 - 400 K/uL  BASIC METABOLIC PANEL     Status: Abnormal   Collection Time    01/20/14 11:11 PM      Result Value Ref Range   Sodium 143  137 - 147 mEq/L   Potassium 3.4 (*) 3.7 - 5.3 mEq/L   Chloride 104  96 - 112 mEq/L   CO2 24  19 - 32 mEq/L   Glucose, Bld 99  70 - 99 mg/dL   BUN 39 (*) 6 - 23 mg/dL   Creatinine, Ser 2.81 (*) 0.50 - 1.35 mg/dL   Calcium 10.6 (*) 8.4 - 10.5 mg/dL   GFR calc non Af Amer 22 (*) >90 mL/min   GFR calc Af Amer 26 (*) >90 mL/min   EKG: nsr, no acute ischemic changes, normal intervals, normal axis, normal qrs complex  MDM   Patient with self limited seizure. Reports compliance with Keppra. Routine labs reveal acute on chronic renal failure with worsening anemia. We are treating with IVF. Will admit.     Elyn Peers, MD 01/21/14 [phone]

## 2014-01-20 NOTE — ED Notes (Addendum)
Pt transported from home with witness grand mal seizure activity x 2 @ 2145. +urinary incontinence. Pts BP initially 134/100 repeat bp down to 74/62. Attempts at IV unsuccessful.  Pt is A & O, pt states he believes he had a stroke today. Pt denies pain at this time.

## 2014-01-20 NOTE — ED Notes (Signed)
Pt c/o being cold several warm blankets provided for comfort.

## 2014-01-21 DIAGNOSIS — D649 Anemia, unspecified: Secondary | ICD-10-CM

## 2014-01-21 DIAGNOSIS — N179 Acute kidney failure, unspecified: Secondary | ICD-10-CM

## 2014-01-21 DIAGNOSIS — R569 Unspecified convulsions: Secondary | ICD-10-CM

## 2014-01-21 DIAGNOSIS — J449 Chronic obstructive pulmonary disease, unspecified: Secondary | ICD-10-CM

## 2014-01-21 DIAGNOSIS — Z8673 Personal history of transient ischemic attack (TIA), and cerebral infarction without residual deficits: Secondary | ICD-10-CM

## 2014-01-21 DIAGNOSIS — N182 Chronic kidney disease, stage 2 (mild): Secondary | ICD-10-CM

## 2014-01-21 LAB — COMPREHENSIVE METABOLIC PANEL
ALBUMIN: 3.3 g/dL — AB (ref 3.5–5.2)
ALK PHOS: 70 U/L (ref 39–117)
ALT: 22 U/L (ref 0–53)
AST: 25 U/L (ref 0–37)
BUN: 31 mg/dL — ABNORMAL HIGH (ref 6–23)
CO2: 24 mEq/L (ref 19–32)
CREATININE: 1.84 mg/dL — AB (ref 0.50–1.35)
Calcium: 9.8 mg/dL (ref 8.4–10.5)
Chloride: 107 mEq/L (ref 96–112)
GFR calc non Af Amer: 37 mL/min — ABNORMAL LOW (ref >90)
GFR, EST AFRICAN AMERICAN: 43 mL/min — AB (ref >90)
Glucose, Bld: 95 mg/dL (ref 70–99)
Potassium: 3.5 mEq/L — ABNORMAL LOW (ref 3.7–5.3)
Sodium: 142 mEq/L (ref 137–147)
TOTAL PROTEIN: 7.6 g/dL (ref 6.0–8.3)
Total Bilirubin: <0.2 mg/dL — ABNORMAL LOW (ref 0.3–1.2)

## 2014-01-21 LAB — MAGNESIUM: MAGNESIUM: 1.8 mg/dL (ref 1.5–2.5)

## 2014-01-21 LAB — PHOSPHORUS: Phosphorus: 2.9 mg/dL (ref 2.3–4.6)

## 2014-01-21 MED ORDER — ALBUTEROL SULFATE (2.5 MG/3ML) 0.083% IN NEBU: 2.5000 mg | INHALATION_SOLUTION | Freq: Four times a day (QID) | RESPIRATORY_TRACT | Status: DC | PRN

## 2014-01-21 MED ORDER — THIAMINE HCL 100 MG/ML IJ SOLN
100.0000 mg | Freq: Every day | INTRAMUSCULAR | Status: DC
  Filled 2014-01-21 (×3): qty 1

## 2014-01-21 MED ORDER — FOLIC ACID 1 MG PO TABS
1.0000 mg | ORAL_TABLET | Freq: Every day | ORAL | Status: DC
  Administered 2014-01-21 – 2014-01-23 (×3): 1 mg via ORAL
  Filled 2014-01-21 (×3): qty 1

## 2014-01-21 MED ORDER — TIOTROPIUM BROMIDE MONOHYDRATE 18 MCG IN CAPS
18.0000 ug | ORAL_CAPSULE | Freq: Every day | RESPIRATORY_TRACT | Status: DC
  Administered 2014-01-21 – 2014-01-23 (×3): 18 ug via RESPIRATORY_TRACT
  Filled 2014-01-21: qty 5

## 2014-01-21 MED ORDER — SODIUM CHLORIDE 0.9 % IV BOLUS (SEPSIS)
1000.0000 mL | Freq: Once | INTRAVENOUS | Status: AC
  Administered 2014-01-21: 1000 mL via INTRAVENOUS

## 2014-01-21 MED ORDER — ATORVASTATIN CALCIUM 80 MG PO TABS
80.0000 mg | ORAL_TABLET | Freq: Every day | ORAL | Status: DC
  Administered 2014-01-21 – 2014-01-22 (×2): 80 mg via ORAL
  Filled 2014-01-21 (×3): qty 1

## 2014-01-21 MED ORDER — SODIUM CHLORIDE 0.9 % IV SOLN
INTRAVENOUS | Status: AC
  Administered 2014-01-21: 14:00:00 via INTRAVENOUS

## 2014-01-21 MED ORDER — CLOPIDOGREL BISULFATE 75 MG PO TABS
75.0000 mg | ORAL_TABLET | Freq: Every day | ORAL | Status: DC
  Administered 2014-01-21 – 2014-01-23 (×3): 75 mg via ORAL
  Filled 2014-01-21 (×4): qty 1

## 2014-01-21 MED ORDER — SODIUM CHLORIDE 0.9 % IJ SOLN
3.0000 mL | Freq: Two times a day (BID) | INTRAMUSCULAR | Status: DC
  Administered 2014-01-22 – 2014-01-23 (×2): 3 mL via INTRAVENOUS

## 2014-01-21 MED ORDER — ACETAMINOPHEN 650 MG RE SUPP: 650.0000 mg | Freq: Four times a day (QID) | RECTAL | Status: DC | PRN

## 2014-01-21 MED ORDER — ACETAMINOPHEN 325 MG PO TABS
650.0000 mg | ORAL_TABLET | Freq: Four times a day (QID) | ORAL | Status: DC | PRN
Start: 2014-01-21 — End: 2014-01-23
  Administered 2014-01-22: 650 mg via ORAL
  Filled 2014-01-21: qty 2

## 2014-01-21 MED ORDER — SODIUM CHLORIDE 0.9 % IV SOLN: INTRAVENOUS | Status: DC

## 2014-01-21 MED ORDER — FERROUS SULFATE 325 (65 FE) MG PO TABS
325.0000 mg | ORAL_TABLET | Freq: Two times a day (BID) | ORAL | Status: DC
  Administered 2014-01-21 – 2014-01-23 (×6): 325 mg via ORAL
  Filled 2014-01-21 (×7): qty 1

## 2014-01-21 MED ORDER — VITAMIN B-1 100 MG PO TABS
100.0000 mg | ORAL_TABLET | Freq: Every day | ORAL | Status: DC
Start: 2014-01-21 — End: 2014-01-23
  Administered 2014-01-21 – 2014-01-23 (×3): 100 mg via ORAL
  Filled 2014-01-21 (×3): qty 1

## 2014-01-21 MED ORDER — LORAZEPAM 2 MG/ML IJ SOLN: 1.0000 mg | Freq: Four times a day (QID) | INTRAMUSCULAR | Status: DC | PRN

## 2014-01-21 MED ORDER — LORAZEPAM 1 MG PO TABS: 1.0000 mg | ORAL_TABLET | Freq: Four times a day (QID) | ORAL | Status: DC | PRN

## 2014-01-21 MED ORDER — ADULT MULTIVITAMIN W/MINERALS CH
1.0000 | ORAL_TABLET | Freq: Every day | ORAL | Status: DC
  Administered 2014-01-21 – 2014-01-23 (×3): 1 via ORAL
  Filled 2014-01-21 (×3): qty 1

## 2014-01-21 NOTE — Progress Notes (Signed)
Subjective: No complaints today. Will like to go to a SNF- one in [place]. Says [facility] left [facility] on [facility]- [facility] days ago, did not like it. No new changes from baseline. Says his speech is slightly slurred because he has no teeth. No reported new weakness. No new episodes since admission.   Objective: Vital signs in last 24 hours: Filed Vitals:   01/21/14 0153 01/21/14 0452 01/21/14 0531 01/21/14 1100  BP: 131/75 122/66 118/63 124/61  Pulse: 79 84 81 73  Temp: 97.8 F (36.6 C) 98.4 F (36.9 C) 97.6 F (36.4 C) 97.9 F (36.6 C)  TempSrc: Oral Oral Oral Oral  Resp: 18 20 19 21   Height:   5\' 8"  (1.727 m)   Weight:   152 lb 14.4 oz (69.355 kg)   SpO2: 100% 100% 100% 100%   Weight change:   Intake/Output Summary (Last 24 hours) at 01/21/14 1152 Last data filed at 01/21/14 1100  Gross per 24 hour  Intake   1960 ml  Output    650 ml  Net   1310 ml   General appearance: alert, cooperative, appears stated age and no distress Head: Normocephalic, without obvious abnormality, atraumatic Lungs: clear to auscultation bilaterally Heart: regular rate and rhythm, S1, S2 normal, no murmur, click, rub or gallop Abdomen: soft, non-tender; bowel sounds normal; no masses,  no organomegaly Extremities: extremities normal, atraumatic, no cyanosis or edema Neuro- Alert and oriented to person, place and time. Strenght- 5/5- Right upper and lower extremities, 4/5 left upper and lower extremities. Pt says this is his baseline, no new changes.    Lab Results: Basic Metabolic Panel:  Recent Labs Lab 01/20/14 2311  NA 143  K 3.4*  CL 104  CO2 24  GLUCOSE 99  BUN 39*  CREATININE 2.81*  CALCIUM 10.6*   CBC:  Recent Labs Lab 01/20/14 2311  WBC 7.1  HGB 7.5*  HCT 23.1*  MCV 82.5  PLT 323   Urine Drug Screen: Drugs of Abuse     Component Value Date/Time   LABOPIA NONE DETECTED 12/02/2013 1935   LABOPIA NEGATIVE 10/14/2010 2140   COCAINSCRNUR NONE DETECTED 12/02/2013 1935   COCAINSCRNUR  Value: POSITIVE (NOTE) Result repeated and verified. Sent for confirmatory testing* 10/14/2010 2140   LABBENZ NONE DETECTED 12/02/2013 1935   LABBENZ NEGATIVE 10/14/2010 2140   AMPHETMU NONE DETECTED 12/02/2013 1935   AMPHETMU NEGATIVE 10/14/2010 2140   THCU NONE DETECTED 12/02/2013 1935   LABBARB NONE DETECTED 12/02/2013 1935    Medications: I have reviewed the patient's current medications. Scheduled Meds: . atorvastatin  80 mg Oral Daily  . clopidogrel  75 mg Oral Q breakfast  . ferrous sulfate  325 mg Oral BID WC  . folic acid  1 mg Oral Daily  . multivitamin with minerals  1 tablet Oral Daily  . sodium chloride  3 mL Intravenous Q12H  . thiamine  100 mg Oral Daily   Or  . thiamine  100 mg Intravenous Daily  . tiotropium  18 mcg Inhalation Daily   Continuous Infusions: . sodium chloride     PRN Meds:.acetaminophen, acetaminophen, albuterol, LORazepam, LORazepam Assessment/Plan:  # Seizure - Likely from medication non compliance, focus from previous stroke. No more episodes since admission. Cannot get in touch with pts pharm. Either due to Keppra underdosing (pt reports only taking 1 tablet-500mg  BID) vs Keppra non-compliance (history of same, though pt currently denies). Pt is supposed to be on 1500mg  BID of keppra. Consider falls.   -  check Mg- Pending  -consult Neuro for recs regarding AED choice while GFR < 30  -CIWA protocol  - Resume home keppra dose once Cr back to baseline.  # Acute Kidney Injury on CKD - Cr elevated to 2.8, from baseline Cr around 1.2-1.5. On a dys 3 diet at home. Likely from Poor PO intake prior to admission), lisinopril use. Hypotensive on admission, BP as low as 74/62 in the ED. Bp today improved. - Await morning Bmet, if AKI resolved reduce IVF to 59mls/hr. -check FeUrea, UA- Pending -trend BMET  -hold lisinopril, HCTZ   # Chronic anemia - The patient has a history of chronic anemia. Hb today is 7.5, which is near baseline of 8's. Iron  studies 12/2013 showed a ferritin of 16, and an iron saturation of 8%, indicating iron deficiency anemia. Colonoscopy 10/2013 revealed 4 polyps, which were removed (tubular adenomas).  - Cont Ferrous sulfate 325 mg bid   # Elevated Anion Gap - The patient's AG is mildly elevated at 15, likely secondary to uremia, though differential includes starvation ketoacidosis.  -check UA for ketones- Pending  -treat AKI per above   # H/o CVA - Mr. Venuto has extensive h/o CVA, as recently as 11/2013, resulting in residual left-sided weakness. No new deficits. Pt mostly dependent for ADL. Will go to SNF or will like to get an AIDE. -Continue Plavix 75 mg po qd + Lipitor 80 mg po qhs  -PT/OT eval   # COPD - At baseline. No SOB.  -Continue Spiriva + Albuterol   DVT/PE PPx- SCD's.  Dispo: Disposition is deferred at this time, awaiting improvement of current medical problems.    The patient does have a current PCP Corky Sox, MD) and does need an [facility] hospital follow-up appointment after discharge.  The patient does not know have transportation limitations that hinder transportation to clinic appointments.  .Services Needed at time of discharge: Y = Yes, Blank = No PT:   OT:   RN:   Equipment:   Other:     LOS: 1 day   Jenetta Downer, MD 01/21/2014, 11:52 AM

## 2014-01-21 NOTE — H&P (Signed)
Date: 01/21/2014               Patient Name:  [name] MRN: [id number]  DOB: 1950-11-27 Age / Sex: 63 y.o., male   PCP: Corky Sox, MD         Medical Service: Internal Medicine Teaching Service         Attending Physician: Dr. Larey Dresser    First Contact: Dr. Denton Brick Pager: [phone]  Second Contact: Dr. Hayes Ludwig Pager: [phone]       After Hours (After 5p/  First Contact Pager: [phone]  weekends / holidays): Second Contact Pager: [phone]   Chief Complaint: Seizure  History of Present Illness: [name] is a 63 y.o. male w/ PMHx of HTN, COPD, metastatic prostate CA, CKD stage II, h/o CVA, and seizures, presents to the ED after having a seizure at home. The patient claims he has at home sitting in a chair, accompanies by his cousin, when he says he had seizure-like activity, shaking in his chair, lasting for about 1-2 minutes. He denies losing consciousness or having bowel or bladder incontinence, however, according to the ED notes, the patient did have urinary incontinence. No tongue biting. The patient denies any new deficits, no new weakness or sensory losses. Patient has a h/o multiple CVA's, as recently as 11/23/13 seen by MRI, w/ residual left-sided weakness, however this is unchanged according to the patient. He does claim he is having quite a bit of difficulty walking these days and uses a walker w/some difficulty. Patient recently returned home from [facility] after being discharged from [facility] on 12/15/13 for seizures at that time as well. Patient takes Keppra 1500 mg bid w/ h/o non-compliance in the past. According to previous notes, patient was discharged on 1500 mg (3 tablets) two times daily, but according to the patient, he has only been taking one tablet 2 times daily. The patient also claims he has had decreased po intake at home recently, saying he has had poor appetite and very little fluid intake. He also claims he has been having difficulty making it to  the bathroom in the recent past 2/2 his left-sided weakness. The patient has a h/o alcohol abuse in the past, says his most recent drink was one beer last Wednesday. He claims he has not had anything to drink for quite a while however, as he has been at [employer]. Patient otherwise denies recreational drug use.    Meds: No current facility-administered medications for this encounter.    Allergies: Allergies as of 01/20/2014 - Review Complete 01/20/2014  Allergen Reaction Noted  . Aspirin Hives 10/18/2012  . Penicillins Hives 10/18/2012  . Other Other (See Comments) 12/12/2013   Past Medical History  Diagnosis Date  . Hypertension 05/18/2007  . COPD 05/18/2007  . Elevated PSA 05/25/2007  . Prostate cancer   . Seizures 12/05/2013; 12/08/2013; 12/11/2013    new onset; recurrent/notes 12/12/2013  . 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 Stroke 12/05/2012  . Cerebral embolism with cerebral infarction     residual left sided weakness/notes 12/12/2013  . Protein-calorie malnutrition, severe     [name] 12/12/2013  . Chronic kidney disease (CKD), stage II (mild)     [name] 12/12/2013   Past Surgical History  Procedure Laterality Date  . 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 Multiple extractions with alveoloplasty N/A 10/23/2013    Procedure: Extraction of tooth #'s 1,2,3,4,5,6,7,11,12,13,14,15,16,20,21,22,23,24,27,28,29,32 with alveoloplasty and bilateral maxillary  tuberosity reductions;  Surgeon: Lenn Cal, DDS;  Location: [place];  Service: Oral Surgery;  Laterality: N/A;  . Tibia fracture surgery Right     pin  . Tonsillectomy     Family History  Problem Relation Age of Onset  . Liver disease Mother   . Heart attack Mother 57  . Colon cancer Neg Hx    History   Social History  . Marital Status: Single    Spouse Name: N/A    Number of Children: 0  . Years of Education: N/A   Occupational History  . Flooring instalation     Retired   Social History Main  Topics  . Smoking status: Current Some Day Smoker -- 0.40 packs/day for 50 years    Types: Cigarettes  . Smokeless tobacco: Never Used     Comment: 1/2 ppd hx, now 3-4 cigs/day  . Alcohol Use: Yes     Comment: 1 beer a week, used to drink 2-3 beers a day  . Drug Use: No     Comment: history of cocaine use  . Sexual Activity: Not Currently   Other Topics Concern  . Not on file   Social History Narrative   Drinks 1/2 pt of wine daily and beer daily (at least 12 oz)   Denies recreational drugs    Smokes 3-4 cigarettes. 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    2 years of college at [employer] per patient           Review of Systems: General: Positive for decreased appetite. Denies fever, chills, diaphoresis, and fatigue.  Respiratory: Denies SOB, DOE, cough, chest tightness, and wheezing.   Cardiovascular: Denies chest pain and palpitations.  Gastrointestinal: Denies nausea, vomiting, abdominal pain, diarrhea, constipation, blood in stool and abdominal distention.  Genitourinary: Denies dysuria, urgency, frequency, hematuria, and flank pain. Endocrine: Denies hot or cold intolerance, polyuria, and polydipsia. Musculoskeletal: Denies myalgias, back pain, joint swelling, arthralgias and gait problem.  Skin: Denies pallor, rash and wounds.  Neurological: Positive for seizures and left sided weakness. Denies dizziness, syncope, weakness, lightheadedness, numbness and headaches.  Psychiatric/Behavioral: Denies mood changes, confusion, nervousness, sleep disturbance and agitation.   Physical Exam: Filed Vitals:   01/21/14 0151 01/21/14 0153 01/21/14 0452 01/21/14 0531  BP:  131/75 122/66 118/63  Pulse:  79 84 81  Temp:  97.8 F (36.6 C) 98.4 F (36.9 C) 97.6 F (36.4 C)  TempSrc:  Oral Oral Oral  Resp:  18 20 19   Height: 5\' 8"  (1.727 m)   5\' 8"  (1.727 m)  Weight: 145 lb 11.6 oz (66.1 kg)   152 lb 14.4 oz (69.355 kg)  SpO2:   100% 100% 100%   General: Vital signs reviewed.  Patient is a frail, elderly male, in no acute distress and cooperative with exam.  Head: Normocephalic and atraumatic. Eyes: PERRL, EOMI, conjunctivae normal, No scleral icterus.  Neck: Supple, trachea midline, normal ROM, No JVD, masses, thyromegaly, or carotid bruit present.  Cardiovascular: RRR, S1 normal, S2 normal, no murmurs, gallops, or rubs. Pulmonary/Chest: Air entry equal bilaterally, no wheezes, rales, or rhonchi. Abdominal: Soft, non-tender, non-distended, BS +, no masses, organomegaly, or guarding present.  Musculoskeletal: No erythema, or stiffness, ROM full and nontender. Large immobile cystic structure inferior to left patella. Unchanged from baseline. Extremities: No swelling or edema, pulses symmetric and intact bilaterally. No cyanosis or clubbing. Neurological: A&O x3, cranial nerve II-XII are grossly intact,  sensory intact to light touch bilaterally. Strength 5/5 in UE's bilaterally. Strength 4/5 in LLE, 5/5 in RLE.  Skin: Warm, dry and intact. No rashes or erythema. Psychiatric: Normal mood and affect. Speech slurred (baseline), behavior is normal. Cognition and memory are normal.     Lab results: Basic Metabolic Panel:  Recent Labs  01/20/14 2311  NA 143  K 3.4*  CL 104  CO2 24  GLUCOSE 99  BUN 39*  CREATININE 2.81*  CALCIUM 10.6*   CBC:  Recent Labs  01/20/14 2311  WBC 7.1  HGB 7.5*  HCT 23.1*  MCV 82.5  PLT 323   Urine Drug Screen: Drugs of Abuse     Component Value Date/Time   LABOPIA NONE DETECTED 12/02/2013 1935   LABOPIA NEGATIVE 10/14/2010 2140   COCAINSCRNUR NONE DETECTED 12/02/2013 1935   COCAINSCRNUR  Value: POSITIVE (NOTE) Result repeated and verified. Sent for confirmatory testing* 10/14/2010 2140   LABBENZ NONE DETECTED 12/02/2013 1935   LABBENZ NEGATIVE 10/14/2010 2140   AMPHETMU NONE DETECTED 12/02/2013 1935   AMPHETMU NEGATIVE 10/14/2010 2140   THCU NONE DETECTED 12/02/2013 1935    LABBARB NONE DETECTED 12/02/2013 1935    Other results: EKG: NSR w/ LAD. Mildly prolonged QRS. 1st degree AV block  Assessment & Plan by Problem: Mr. LATWAN LUCHSINGER is a 64 y.o. male w/ PMHx of HTN, COPD, metastatic prostate CA, CKD stage II, h/o CVA, and seizures, admitted for repeated seizure activity and AKI.   # Seizure - The patient's episode of generalized shaking, without LOC, post-ictal confusion, or loss of bowel/bladder continence, is of unclear etiology. Due to his early morning admission, we were unable to reach the patient's cousin who witnessed the event, or the patient's pharmacy to confirm whether he had recently filled his keppra. This may have represented a recurrent seizure, either due to Keppra underdosing (pt reports only taking 1 tablet BID) vs Keppra non-compliance (history of same, though pt currently denies), less likely 2/2 alcohol withdrawal (last drink 4 days PTA). Other etiologies to consider include mechanical fall (due to residual weakness 2/2 stroke) vs ?muscle spasm.  -check Mg  -consult Neuro for recs regarding AED choice while GFR < 30  -CIWA protocol   # Acute Kidney Injury on CKD - Cr elevated to 2.8, from baseline Cr around 1.2-1.5. The patient has a history of mild dysphagia secondary to prior strokes, and is currently on a dys 3 diet at home. The patient's AKI may have started as pre-renal azotemia (pt notes poor PO intake prior to admission), and subsequently developed into ATN due to lisinopril use. Patient also noted to have  hypotension on admission, BP as low as 74/62 in the ED. -IVF; 1L NS bolus then NS @150  cc/hr -check FeUrea, UA  -trend BMET  -hold lisinopril, HCTZ   # Chronic anemia - The patient has a history of chronic anemia. Hb today is 7.5, which is near baseline of 8's. Iron studies 12/2013 showed a ferritin of 16, and an iron saturation of 8%, indicating iron deficiency anemia. Colonoscopy 10/2013 revealed 4 polyps, which were removed (tubular  adenomas).  -start PO iron supplementation; Ferrous sulfate 325 mg bid  # Elevated Anion Gap - The patient's AG is mildly elevated at 15, likely secondary to uremia, though differential includes starvation ketoacidosis.  -check UA for ketones  -treat AKI per above  # H/o CVA - Mr. Kross has extensive h/o CVA, as recently as 11/2013, resulting in residual left-sided weakness. Patient claims  he has difficulty walking 2/2 left leg weakness, but claims this is generally unchanged from previously. No new deficits.  -Continue Plavix 75 mg po qd + Lipitor 80 mg po qhs -PT/OT eval  # COPD - At baseline. No SOB. -Continue Spiriva + Albuterol  DVT/PE PPx- SCD's  Dispo: Disposition is deferred at this time, awaiting improvement of current medical problems. Anticipated discharge in approximately 1-2 day(s).   The patient does have a current PCP Corky Sox, MD) and does need an [facility] hospital follow-up appointment after discharge.  The patient does not have transportation limitations that hinder transportation to clinic appointments.  Signed: Corky Sox, MD 01/21/2014, 5:45 AM

## 2014-01-21 NOTE — H&P (Signed)
  Date: 01/21/2014  Patient name: [name]  Medical record number: [id number]  Date of birth: [birth year]   I have seen and evaluated [name] and discussed their care with the Residency Team. [name] is known to me as he was on our service in May when he was admitted for szs which were attributable to his new CVA in April 2015. The szs in mAy were bc he orders his Keppra through mail order and there was a delay and he did not have his meds. He was D/C'd to [facility] center and did well and was D/C'd home 6/10. He had a sz at home and was readmitted. There is good evidence that he was taking Keppra but not the dose recommended. He also endorses a poor appetite recently.   He lives with his cousin [name]. Since the stroke, he needs assistance with all ADL's excepting feeding himself. [name] has been helping with his ADL's but she works. He has had an aide and home PT in past.   Filed Vitals:   01/21/14 0153 01/21/14 0452 01/21/14 0531 01/21/14 1100  BP: 131/75 122/66 118/63 124/61  Pulse: 79 84 81 73  Temp: 97.8 F (36.6 C) 98.4 F (36.9 C) 97.6 F (36.4 C) 97.9 F (36.6 C)  TempSrc: Oral Oral Oral Oral  Resp: 18 20 19 21   Height:   5\' 8"  (1.727 m)   Weight:   152 lb 14.4 oz (69.355 kg)   SpO2: 100% 100% 100% 100%   Gen : eating breakfast. Speech is at baseline. A&O x 3. HRRR LCTAB. ABD + BS. Neuro no focal.  Labs and imaging reviewed. Cr 2.81 from 0.94.   Assessment and Plan: I have seen and evaluated the patient as outlined above. I agree with the formulated Assessment and Plan as detailed in the residents' admission note, with the following changes:   1. Recurrent sz - the trigger was the April CVA. He has no signs or sxs of a new CVA therefore, CT head not indicated. Electrolytes OK except K 3.4. Resume home Keppra dose once Cr back to baseline  2. Acute renal failure - likely pre-renal as pt has had poor PO intake. Appetite improved today. Follow Cr.  IVF  3, H/O CVA - cont statin and plavix. PT / OT and arrange for home aide and PT.   Bartholomew Crews, MD 6/14/201511:38 AM

## 2014-01-21 NOTE — Evaluation (Signed)
Physical Therapy Evaluation Patient Details Name: [name] MRN: [id number] DOB: 03-Aug-1951 Today's Date: 01/21/2014   History of Present Illness  History of Present Illness: [name] is a 63 y.o. male w/ PMHx of HTN, COPD, metastatic prostate CA, CKD stage II, h/o CVA, and seizures, presents to the ED after having a seizure at home.   Clinical Impression   Pt admitted with above, and with deficits from CVAs. Pt currently with functional limitations due to the deficits listed below (see PT Problem List).  Pt will benefit from skilled PT to increase their independence and safety with mobility to allow discharge to the venue listed below.   Pt is very motivated to move better and safer and stated he would like to work on gait training; I believe he will participate in PT/OT quite well; While he had a recent SNF stay, given his insurance, he likely did not receive therapies there; Recommend CIR stay for intensive rehab to maximize independence and safety with mobility      Follow Up Recommendations CIR    Equipment Recommendations  Other (comment) (To be determined)    Recommendations for Other Services OT consult;Rehab consult     Precautions / Restrictions Precautions Precautions: Fall      Mobility  Bed Mobility Overal bed mobility: Needs Assistance Bed Mobility: Supine to Sit     Supine to sit: Min guard     General bed mobility comments: Minguard for safety, but able to get up without physical assist; Uses RLE to assist LLE off of bed, and pushes up with RUE  Transfers Overall transfer level: Needs assistance Equipment used: Rolling walker (2 wheeled) Transfers: Sit to/from Stand Sit to Stand: Mod assist         General transfer comment: Cues for technique, and to initiate sit to stand with anterior weight shift as he tends to have a significant posterior lean; At initial stand, he will tend to let go of the RW and reach back for teh bed without  warning; verbal and tactile cues for fully extending hips and to translate weight forward in standing  Ambulation/Gait Ambulation/Gait assistance: Mod assist;Max assist Ambulation Distance (Feet):  (1 step forwards and backwards) Assistive device: Rolling walker (2 wheeled) Gait Pattern/deviations: Step-to pattern;Decreased step length - right;Decreased stance time - left;Leaning posteriorly     General Gait Details: Cues for technique and phsyical assist/block for L hip and knee stability in stance to be able to advance R foot; Assist to advance L foot  Stairs            Wheelchair Mobility    Modified Rankin (Stroke Patients Only)       Balance Overall balance assessment: Needs assistance Sitting-balance support: Single extremity supported;Feet supported Sitting balance-Leahy Scale: Fair Sitting balance - Comments: Worked on sitting balance, and then dynamic sitting balance for scooting; Difficulty with lateral weight shifting to unweigh R and L hips for reciprocal scooting; Also difficulty with flexiing at waist and hips to translate weight over feet and off of hips for lateral scoot Postural control: Posterior lean Standing balance support: Bilateral upper extremity supported Standing balance-Leahy Scale: Zero Standing balance comment: heavy posterior lean in standing                             Pertinent Vitals/Pain no apparent distress     Home Living Family/patient expects to be discharged to:: Unsure Living Arrangements: Other relatives  Additional Comments: Living with his cousin, [name], who works    Prior Function Level of Independence: Needs assistance   Gait / Transfers Assistance Needed: Pt had been able to walk 65' with RW mod I but for grossly a week has been needing assist to transfer and using WC. after each seizure episode pt with progressing weakness per cousin  ADL's / Homemaking Assistance Needed: assist to  shower        Hand Dominance   Dominant Hand: Right    Extremity/Trunk Assessment   Upper Extremity Assessment: Defer to OT evaluation (Dyscoordination and bradykinetic Rhand/UE)           Lower Extremity Assessment: LLE deficits/detail   LLE Deficits / Details: hip flexor and knee ext strength 2+/5; minimal active ankle motion     Communication   Communication: Expressive difficulties  Cognition Arousal/Alertness: Awake/alert Behavior During Therapy: WFL for tasks assessed/performed Overall Cognitive Status: Within Functional Limits for tasks assessed                      General Comments      Exercises        Assessment/Plan    PT Assessment Patient needs continued PT services  PT Diagnosis Difficulty walking;Hemiplegia non-dominant side   PT Problem List Decreased strength;Decreased range of motion;Decreased activity tolerance;Decreased balance;Decreased mobility;Decreased coordination;Decreased cognition;Decreased knowledge of use of DME;Decreased safety awareness;Decreased knowledge of precautions;Impaired tone  PT Treatment Interventions DME instruction;Gait training;Functional mobility training;Therapeutic activities;Therapeutic exercise;Balance training;Neuromuscular re-education;Cognitive remediation;Patient/family education   PT Goals (Current goals can be found in the Care Plan section) Acute Rehab PT Goals Patient Stated Goal: Very motivated to move better PT Goal Formulation: With patient Time For Goal Achievement: 02/04/14 Potential to Achieve Goals: Good    Frequency Min 3X/week   Barriers to discharge Other (comment) Lack of therapy follow up given his insurance if he dc's home or to SNF    Co-evaluation               End of Session Equipment Utilized During Treatment: Gait belt Activity Tolerance: Patient tolerated treatment well Patient left: in bed;with call bell/phone within reach;with family/visitor present Nurse  Communication: Mobility status         Time: 2952-8413 PT Time Calculation (min): 36 min   Charges:   PT Evaluation $Initial PT Evaluation Tier I: 1 Procedure PT Treatments $Therapeutic Activity: 23-37 mins   PT G Codes:          Quin Hoop 01/21/2014, 4:48 PM  Roney Marion, [place] Pager [phone] Office [phone]

## 2014-01-21 NOTE — Progress Notes (Signed)
New Admission Note:   Arrival Method: per stretcher from [facility] accompanied by EMT headed by Brain Hilts Mental Orientation: alert, oriented X4, calm, cooperative Telemetry: placed on box 2 after Central [employer] was notified Assessment: Completed Skin: warm, dry, flaky with notable painless mass on the left knee IV:  G20 on the right antecubital, site is clean, dry and intact, with transparent dressing Pain: denies having pain as of this time Tubes: IV line with NS infusing Safety Measures: Safety Fall Prevention Plan has been given, discussed and signed Admission: Completed [address] [place] Orientation: Patient has been orientated to the room, unit and staff.  Family: no family member present at bedside  Orders have been reviewed and implemented. Will continue to monitor the patient. Call light has been placed within reach and bed alarm has been activated.   Georgeanna Harrison BSN, RN [place] [address] [place]

## 2014-01-22 DIAGNOSIS — N189 Chronic kidney disease, unspecified: Secondary | ICD-10-CM

## 2014-01-22 DIAGNOSIS — D509 Iron deficiency anemia, unspecified: Secondary | ICD-10-CM

## 2014-01-22 LAB — CBC
HCT: 19.9 % — ABNORMAL LOW (ref 39.0–52.0)
Hemoglobin: 6.5 g/dL — CL (ref 13.0–17.0)
MCH: 26.9 pg (ref 26.0–34.0)
MCHC: 32.7 g/dL (ref 30.0–36.0)
MCV: 82.2 fL (ref 78.0–100.0)
PLATELETS: 300 10*3/uL (ref 150–400)
RBC: 2.42 MIL/uL — AB (ref 4.22–5.81)
RDW: 13.5 % (ref 11.5–15.5)
WBC: 6.9 10*3/uL (ref 4.0–10.5)

## 2014-01-22 LAB — URINALYSIS, ROUTINE W REFLEX MICROSCOPIC
BILIRUBIN URINE: NEGATIVE
Glucose, UA: NEGATIVE mg/dL
HGB URINE DIPSTICK: NEGATIVE
Ketones, ur: NEGATIVE mg/dL
Leukocytes, UA: NEGATIVE
Nitrite: NEGATIVE
PROTEIN: NEGATIVE mg/dL
SPECIFIC GRAVITY, URINE: 1.02 (ref 1.005–1.030)
Urobilinogen, UA: 0.2 mg/dL (ref 0.0–1.0)
pH: 5.5 (ref 5.0–8.0)

## 2014-01-22 LAB — BASIC METABOLIC PANEL
BUN: 20 mg/dL (ref 6–23)
CALCIUM: 9.8 mg/dL (ref 8.4–10.5)
CO2: 24 mEq/L (ref 19–32)
CREATININE: 1.13 mg/dL (ref 0.50–1.35)
Chloride: 108 mEq/L (ref 96–112)
GFR calc Af Amer: 78 mL/min — ABNORMAL LOW (ref >90)
GFR calc non Af Amer: 67 mL/min — ABNORMAL LOW (ref >90)
Glucose, Bld: 104 mg/dL — ABNORMAL HIGH (ref 70–99)
Potassium: 3.6 mEq/L — ABNORMAL LOW (ref 3.7–5.3)
SODIUM: 142 meq/L (ref 137–147)

## 2014-01-22 LAB — RAPID URINE DRUG SCREEN, HOSP PERFORMED
Amphetamines: NOT DETECTED
BENZODIAZEPINES: NOT DETECTED
Barbiturates: NOT DETECTED
Cocaine: NOT DETECTED
Opiates: NOT DETECTED
Tetrahydrocannabinol: NOT DETECTED

## 2014-01-22 LAB — CREATININE, URINE, RANDOM: Creatinine, Urine: 67.61 mg/dL

## 2014-01-22 LAB — VITAMIN B12: Vitamin B-12: 640 pg/mL (ref 211–911)

## 2014-01-22 LAB — PREPARE RBC (CROSSMATCH)

## 2014-01-22 MED ORDER — LEVETIRACETAM 750 MG PO TABS
1500.0000 mg | ORAL_TABLET | Freq: Two times a day (BID) | ORAL | Status: DC
  Administered 2014-01-22 – 2014-01-23 (×3): 1500 mg via ORAL
  Filled 2014-01-22 (×4): qty 2

## 2014-01-22 MED ORDER — LEVETIRACETAM 750 MG PO TABS
750.0000 mg | ORAL_TABLET | ORAL | Status: DC
  Filled 2014-01-22: qty 1

## 2014-01-22 NOTE — Progress Notes (Signed)
Subjective: No complaints today. No bleeding. No complaints of dizziness.  Objective: Vital signs in last 24 hours: Filed Vitals:   01/21/14 1700 01/21/14 1937 01/22/14 0413 01/22/14 0740  BP: 107/21 102/52 109/65   Pulse: 84 85 79   Temp: 98.8 F (37.1 C) 98.7 F (37.1 C) 98.8 F (37.1 C)   TempSrc: Oral     Resp: 21 19 18    Height:      Weight:  150 lb 14.4 oz (68.448 kg)    SpO2: 100% 99% 98% 97%   Weight change: 1 lb 14.4 oz (0.862 kg)  Intake/Output Summary (Last 24 hours) at 01/22/14 0839 Last data filed at 01/22/14 0413  Gross per 24 hour  Intake    960 ml  Output    800 ml  Net    160 ml   General appearance: NAD. Head: Normocephalic, without obvious abnormality, atraumatic Lungs: No added sounds Heart: regular rate and rhythm, S1, S2 normal, no murmur, click, rub or gallop Abdomen: soft, non-tender; bowel sounds normal;  Extremities: extremities normal, atraumatic, no cyanosis or edema Neuro- Alert and oriented to person, place and time. Strenght- 5/5- Right upper and lower extremities, 4/5 left upper and lower extremities. No new deficits.    Lab Results: Basic Metabolic Panel:  Recent Labs Lab 01/20/14 2311 01/21/14 1200  NA 143 142  K 3.4* 3.5*  CL 104 107  CO2 24 24  GLUCOSE 99 95  BUN 39* 31*  CREATININE 2.81* 1.84*  CALCIUM 10.6* 9.8  MG  --  1.8  PHOS  --  2.9   CBC:  Recent Labs Lab 01/20/14 2311  WBC 7.1  HGB 7.5*  HCT 23.1*  MCV 82.5  PLT 323   Urine Drug Screen: Drugs of Abuse     Component Value Date/Time   LABOPIA NONE DETECTED 12/02/2013 1935   LABOPIA NEGATIVE 10/14/2010 2140   COCAINSCRNUR NONE DETECTED 12/02/2013 1935   COCAINSCRNUR  Value: POSITIVE (NOTE) Result repeated and verified. Sent for confirmatory testing* 10/14/2010 2140   LABBENZ NONE DETECTED 12/02/2013 1935   LABBENZ NEGATIVE 10/14/2010 2140   AMPHETMU NONE DETECTED 12/02/2013 1935   AMPHETMU NEGATIVE 10/14/2010 2140   THCU NONE DETECTED 12/02/2013 1935   LABBARB NONE DETECTED 12/02/2013 1935    Medications: I have reviewed the patient's current medications. Scheduled Meds: . atorvastatin  80 mg Oral Daily  . clopidogrel  75 mg Oral Q breakfast  . ferrous sulfate  325 mg Oral BID WC  . folic acid  1 mg Oral Daily  . multivitamin with minerals  1 tablet Oral Daily  . sodium chloride  3 mL Intravenous Q12H  . thiamine  100 mg Oral Daily   Or  . thiamine  100 mg Intravenous Daily  . tiotropium  18 mcg Inhalation Daily   Continuous Infusions:   PRN Meds:.acetaminophen, acetaminophen, albuterol, LORazepam, LORazepam Assessment/Plan:  # Seizure - Likely from medication non compliance, focus from previous stroke. No more episodes since admission. Mag- 1.8. - Bmet today with normal Cr- Restart home Keppra- 1500mg  BID. - CIWA protocol  - Talked with neurology, can restart normal dose, no change in therapy, formal consult no needed.  # Acute Kidney Injury on CKD - Cr today- 1.13. >> 2.8. Likely from Poor PO intake prior to admission, lisinopril use. Hypotensive on admission, BP as low as 74/62 in the ED.  - Await morning Bmet, if AKI resolved Stop IVF to 74mls/hr. - UA- Pending - trend BMET  -  Hold lisinopril, HCTZ   # Iron defc anemia- Chronic - Hgb- 6.5 today. The patient has a history of chronic anemia. Hb today is 7.5, which is near baseline of 8's. Iron studies 12/2013 showed a ferritin of 16, and an iron saturation of 8%, indicating iron deficiency anemia. Colonoscopy 10/2013 revealed 4 polyps, which were removed (tubular adenomas). Occult bleeding Likely from diverticulosis.  - Transfuse one unit today. - FOBT - Cont Ferrous sulfate 325 mg bid   # H/o CVA - Mr. Virnig has extensive h/o CVA, as recently as 11/2013, resulting in residual left-sided weakness. No new deficits. Pt mostly dependent for ADL. Will go to SNF or will like to get an AIDE. -Continue Plavix 75 mg po qd + Lipitor 80 mg po qhs  -PT- Recs CIR- Awaiting Evaluation.   #  COPD - At baseline. No SOB.  -Continue Spiriva + Albuterol   DVT/PE PPx- SCD's.  Dispo: Disposition is deferred at this time, awaiting improvement of current medical problems.    The patient does have a current PCP Corky Sox, MD) and does need an [facility] hospital follow-up appointment after discharge.  The patient does not know have transportation limitations that hinder transportation to clinic appointments.  .Services Needed at time of discharge: Y = Yes, Blank = No PT:   OT:   RN:   Equipment:   Other:     LOS: 2 days   Jenetta Downer, MD 01/22/2014, 8:39 AM

## 2014-01-22 NOTE — Consult Note (Signed)
Physical Medicine and Rehabilitation Consult Reason for Consult: Seizures Referring Physician: Dr. Lynnae Pittman   HPI: [name] is a 63 y.o. 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 He was d/c four days ago and was awaiting mail order Rx of antiseizure medications and developed seizures X 2 on 06/14/1. He was admitted for workup due to seizures X 2 and reports of FTT. He was hypotensive in ED and was treated with IVF bolus. Anemia of chronic disease at baseline  With Hgb 7.5. UDS negative.  PT evaluation done past admission and CIR recommended due to balance deficits and left knee instability. Patient's cousin works and he's alone during the day. He feels that he has declined in the past month and he would like intensive therapies to return to prior level of activity.    Review of Systems  Respiratory: Positive for cough. Negative for shortness of breath.   Cardiovascular: Positive for palpitations. Negative for chest pain.  Musculoskeletal: Positive for joint pain and myalgias.  Neurological: Positive for focal weakness and weakness. Negative for headaches.     Past Medical History  Diagnosis Date  . Hypertension 05/18/2007  . COPD 05/18/2007  . Elevated PSA 05/25/2007  . Prostate cancer   . Seizures 12/05/2013; 12/08/2013; 12/11/2013    new onset; recurrent/notes 12/12/2013  . 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 Stroke 12/05/2012  . Cerebral embolism with cerebral infarction     residual left sided weakness/notes 12/12/2013  . Protein-calorie malnutrition, severe     [name] 12/12/2013  . Chronic kidney disease (CKD), stage II (mild)     [name] 12/12/2013   Past Surgical History  Procedure Laterality Date  . 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 Multiple extractions with alveoloplasty N/A 10/23/2013    Procedure:  Extraction of tooth #'s 1,2,3,4,5,6,7,11,12,13,14,15,16,20,21,22,23,24,27,28,29,32 with alveoloplasty and bilateral maxillary tuberosity reductions;  Surgeon: [name], DDS;  Location: [facility];  Service: Oral Surgery;  Laterality: N/A;  . Tibia fracture surgery Right     pin  . Tonsillectomy     Family History  Problem Relation Age of Onset  . Liver disease Mother   . Heart attack Mother 31  . Colon cancer Neg Hx    Social History:  Lives with cousin. Was able to transfer with walker. Uses wheelchair for mobility but was able to walk short distances. Needed assist with ADLs. He reports that he has been smoking Cigarettes--one a week?   He has a 20 pack-year smoking history. He has never used smokeless tobacco. He reports that he drinks alcohol--last 2 weeks ago. He reports that he does not use illicit drugs.   Allergies  Allergen Reactions  . Aspirin Hives  . Penicillins Hives  . Other Other (See Comments)    Epidural Other(seizure)   Medications Prior to Admission  Medication Sig Dispense Refill  . albuterol (PROVENTIL HFA;VENTOLIN HFA) 108 (90 BASE) MCG/ACT inhaler Inhale 2 puffs into the lungs every 6 (six) hours as needed for wheezing.  8.5 g  3  . atorvastatin (LIPITOR) 80 MG tablet Take 80 mg by mouth daily.      . chlorhexidine (PERIDEX) 0.12 % solution Use as directed 15 mLs in the mouth or throat 2 (two) times daily. Use after breakfast and at bedtime:  Rinse and spit  out excess, do not swallow      . clopidogrel (PLAVIX) 75 MG tablet Take 1 tablet (75 mg total) by mouth daily with breakfast.  90 tablet  0  . hydrochlorothiazide (HYDRODIURIL) 25 MG tablet Take 1 tablet (25 mg total) by mouth daily.  30 tablet  5  . levETIRAcetam (KEPPRA) 500 MG tablet Take 3 tablets (1,500 mg total) by mouth 2 (two) times daily.  540 tablet  1  . lisinopril (PRINIVIL,ZESTRIL) 10 MG tablet Take 1 tablet (10 mg total) by mouth daily.  30 tablet  2  . Multiple Vitamin (MULTIVITAMIN WITH  MINERALS) TABS tablet Take 1 tablet by mouth daily.      Marland Kitchen tiotropium (SPIRIVA HANDIHALER) 18 MCG inhalation capsule Place 1 capsule (18 mcg total) into inhaler and inhale daily.  30 capsule  5    Home: Home Living Family/patient expects to be discharged to:: Unsure Living Arrangements: Other relatives Additional Comments: Living with his cousin, [name], who works  Functional History: Prior Function Level of Independence: Needs assistance Gait / Transfers Assistance Needed: Pt had been able to walk 57' with RW mod I but for grossly a week has been needing assist to transfer and using WC. after each seizure episode pt with progressing weakness per cousin ADL's / Homemaking Assistance Needed: assist to shower Communication / Swallowing Assistance Needed: Pt has no teeth, they were all extracted earlier this year.   Functional Status:  Mobility: Bed Mobility Overal bed mobility: Needs Assistance Bed Mobility: Supine to Sit Supine to sit: Min guard General bed mobility comments: Minguard for safety, but able to get up without physical assist; Uses RLE to assist LLE off of bed, and pushes up with RUE Transfers Overall transfer level: Needs assistance Equipment used: Rolling walker (2 wheeled) Transfers: Sit to/from Stand Sit to Stand: Mod assist General transfer comment: Cues for technique, and to initiate sit to stand with anterior weight shift as he tends to have a significant posterior lean; At initial stand, he will tend to let go of the RW and reach back for teh bed without warning; verbal and tactile cues for fully extending hips and to translate weight forward in standing Ambulation/Gait Ambulation/Gait assistance: Mod assist;Max assist Ambulation Distance (Feet):  (1 step forwards and backwards) Assistive device: Rolling walker (2 wheeled) Gait Pattern/deviations: Step-to pattern;Decreased step length - right;Decreased stance time - left;Leaning posteriorly General Gait  Details: Cues for technique and phsyical assist/block for L hip and knee stability in stance to be able to advance R foot; Assist to advance L foot    ADL:    Cognition: Cognition Overall Cognitive Status: Within Functional Limits for tasks assessed Orientation Level: Oriented X4 Cognition Arousal/Alertness: Awake/alert Behavior During Therapy: WFL for tasks assessed/performed Overall Cognitive Status: Within Functional Limits for tasks assessed  Blood pressure 109/65, pulse 79, temperature 98.8 F (37.1 C), temperature source Oral, resp. rate 18, height 5\' 8"  (1.727 m), weight 68.448 kg (150 lb 14.4 oz), SpO2 97.00%. Physical Exam  Nursing note and vitals reviewed. Constitutional: He is oriented to person, place, and time.  Thin male  HENT:  Head: Normocephalic and atraumatic.  Eyes: Pupils are equal, round, and reactive to light. Right conjunctiva is injected. Left conjunctiva is injected.  Neck: Normal range of motion. Neck supple.  Cardiovascular: Normal rate and regular rhythm.   No murmur heard. Respiratory: Effort normal and breath sounds normal. No respiratory distress.  GI: Soft. Bowel sounds are normal. He exhibits no distension.  Musculoskeletal: He  exhibits no edema.  Hamstring discomfort with extension left knee. Large cyst left inferior patella region.   Neurological: He is alert and oriented to person, place, and time.  Soft wet sounding voice (baseline). Able to follow basic commands without difficulty. Lying in bed with LLE flexed. Tends to keep LUE flexed and poor effort with exam.  Left sided weakness with few beats clonus left foot. LUE is 2 to 2+/5 deltoid, bicep, tricep. LLE is 3-HF, 3KE and 3 ankle dorsi/plantar. Decreased PP/LT LUE and LLE  Skin: Skin is warm and dry.  Psychiatric: He has a normal mood and affect. His behavior is normal.    Results for orders placed during the hospital encounter of 01/20/14 (from the past 24 hour(s))  COMPREHENSIVE  METABOLIC PANEL     Status: Abnormal   Collection Time    01/21/14 12:00 PM      Result Value Ref Range   Sodium 142  137 - 147 mEq/L   Potassium 3.5 (*) 3.7 - 5.3 mEq/L   Chloride 107  96 - 112 mEq/L   CO2 24  19 - 32 mEq/L   Glucose, Bld 95  70 - 99 mg/dL   BUN 31 (*) 6 - 23 mg/dL   Creatinine, Ser 1.84 (*) 0.50 - 1.35 mg/dL   Calcium 9.8  8.4 - 10.5 mg/dL   Total Protein 7.6  6.0 - 8.3 g/dL   Albumin 3.3 (*) 3.5 - 5.2 g/dL   AST 25  0 - 37 U/L   ALT 22  0 - 53 U/L   Alkaline Phosphatase 70  39 - 117 U/L   Total Bilirubin <0.2 (*) 0.3 - 1.2 mg/dL   GFR calc non Af Amer 37 (*) >90 mL/min   GFR calc Af Amer 43 (*) >90 mL/min  MAGNESIUM     Status: None   Collection Time    01/21/14 12:00 PM      Result Value Ref Range   Magnesium 1.8  1.5 - 2.5 mg/dL  PHOSPHORUS     Status: None   Collection Time    01/21/14 12:00 PM      Result Value Ref Range   Phosphorus 2.9  2.3 - 4.6 mg/dL   No results found.  Assessment/Plan: Diagnosis: recurrent seizures, previous left hemiparesis after CVA, deconditioning 1. Does the need for close, 24 hr/day medical supervision in concert with the patient's rehab needs make it unreasonable for this patient to be served in a less intensive setting? Yes 2. Co-Morbidities requiring supervision/potential complications: copd, htn,  3. Due to bladder management, bowel management, safety, skin/wound care, disease management, medication administration and pain management, does the patient require 24 hr/day rehab nursing? Yes 4. Does the patient require coordinated care of a physician, rehab nurse, PT (1-2 hrs/day, 5 days/week) and OT (1-2 hrs/day, 5 days/week) to address physical and functional deficits in the context of the above medical diagnosis(es)? Yes Addressing deficits in the following areas: balance, endurance, locomotion, strength, transferring, bowel/bladder control, bathing, dressing, feeding, grooming, toileting and psychosocial support 5. Can  the patient actively participate in an intensive therapy program of at least 3 hrs of therapy per day at least 5 days per week? Yes 6. The potential for patient to make measurable gains while on inpatient rehab is excellent 7. Anticipated functional outcomes upon discharge from inpatient rehab are modified independent  with PT, modified independent with OT, n/a with SLP. 8. Estimated rehab length of stay to reach the above functional goals is:  10-14 days 9. Does the patient have adequate social supports to accommodate these discharge functional goals? Yes and Potentially 10. Anticipated D/C setting: Home 11. Anticipated post D/C treatments: [facility] therapy 12. Overall Rehab/Functional Prognosis: good  RECOMMENDATIONS: This patient's condition is appropriate for continued rehabilitative care in the following setting: CIR Patient has agreed to participate in recommended program. Yes Note that insurance prior authorization may be required for reimbursement for recommended care.  Comment: Pt is very motivated to get stronger. Wants to participate in our program. Rehab Admissions Coordinator to follow up.  Thanks,  Meredith Staggers, MD, Mellody Drown     01/22/2014

## 2014-01-22 NOTE — Progress Notes (Signed)
UR completed 

## 2014-01-22 NOTE — Evaluation (Signed)
Occupational Therapy Evaluation Patient Details Name: [name] MRN: [id number] DOB: June 04, 1951 Today's Date: 01/22/2014    History of Present Illness History of Present Illness: [name] is a 63 y.o. male w/ PMHx of HTN, COPD, metastatic prostate CA, CKD stage II, h/o CVA, and seizures, presents to the ED after having a seizure at home.    Clinical Impression   This 63 yo male admitted with above presents to acute OT with decreased controlled use of LUE/LLE, decreased balance (sitting and standing) all affecting pt's ability to take care of himself. He will benefit from acute OT with follow up on CIR to get to a S/Mod I level from W/C (perhaps minimal ambulatory)    Follow Up Recommendations  CIR    Equipment Recommendations  None recommended by OT       Precautions / Restrictions Precautions Precautions: Fall Restrictions Weight Bearing Restrictions: No      Mobility Bed Mobility Overal bed mobility: Needs Assistance Bed Mobility: Supine to Sit     Supine to sit: Min guard;HOB elevated     General bed mobility comments: With increased time  Transfers Overall transfer level: Needs assistance   Transfers: Sit to/from Stand;Stand Pivot Transfers Sit to Stand: Mod assist (posterior lean) Stand pivot transfers: Max assist            Balance Overall balance assessment: Needs assistance Sitting-balance support: Single extremity supported;Feet supported Sitting balance-Leahy Scale: Fair   Postural control: Posterior lean Standing balance support: Bilateral upper extremity supported Standing balance-Leahy Scale: Zero                              ADL Overall ADL's : Needs assistance/impaired Eating/Feeding: Independent;Sitting   Grooming: Set up;Supervision/safety;Sitting   Upper Body Bathing: Set up;Supervision/ safety;Sitting   Lower Body Bathing: Moderate assistance;Sit to/from stand   Upper Body Dressing : Minimal  assistance;Sitting   Lower Body Dressing: Moderate assistance;Sit to/from stand   Toilet Transfer: Maximal assistance;Stand-pivot (bed>recliner going to pt's left. Has to do in 'steps" (leg leg, block left leg, step with right leg))   Toileting- Clothing Manipulation and Hygiene: Total assistance;Sit to/from stand       Functional mobility during ADLs: Maximal assistance                 Pertinent Vitals/Pain No c/o pain     Hand Dominance Right   Extremity/Trunk Assessment Upper Extremity Assessment Upper Extremity Assessment: LUE deficits/detail LUE Deficits / Details: Dysmetria, but functional (needs prompting to use) LUE Sensation: decreased proprioception LUE Coordination: decreased fine motor;decreased gross motor           Communication Communication Communication: Expressive difficulties   Cognition Arousal/Alertness: Awake/alert Behavior During Therapy: WFL for tasks assessed/performed Overall Cognitive Status: Within Functional Limits for tasks assessed                                Home Living Family/patient expects to be discharged to:: Inpatient rehab Living Arrangements: Other relatives (cousin)                               Additional Comments: Living with his cousin, [name], who works; will have a caregiver with him from 11am-2pm during the day per his cousin      Prior Functioning/Environment Level of Independence: Needs  assistance  Gait / Transfers Assistance Needed: Pt had been able to walk 30' with RW mod I but for grossly a week has been needing assist to transfer and using WC. after each seizure episode pt with progressing weakness per cousin ADL's / Homemaking Assistance Needed: assist to shower Communication / Swallowing Assistance Needed: Pt has no teeth, they were all extracted earlier this year.        OT Diagnosis: Generalized weakness;Hemiplegia non-dominant side   OT Problem List: Decreased  strength;Decreased range of motion;Impaired balance (sitting and/or standing);Decreased coordination;Impaired UE functional use;Impaired tone   OT Treatment/Interventions: Self-care/ADL training;Patient/family education;Balance training;Therapeutic exercise;Therapeutic activities;DME and/or AE instruction    OT Goals(Current goals can be found in the care plan section) Acute Rehab OT Goals Patient Stated Goal: Ready to do whatever is asked of him. OT Goal Formulation: With patient Time For Goal Achievement: 02/05/14  OT Frequency: Min 3X/week              End of Session Equipment Utilized During Treatment: Gait belt Nurse Communication: Mobility status (NT--transfer to pt's right)  Activity Tolerance: Patient tolerated treatment well Patient left: in chair;with call bell/phone within reach;with chair alarm set   Time: 1011-1045 OT Time Calculation (min): 34 min Charges:  OT General Charges $OT Visit: 1 Procedure OT Evaluation $Initial OT Evaluation Tier I: 1 Procedure OT Treatments $Self Care/Home Management : 23-37 mins  Almon Register [phone] 01/22/2014, 11:51 AM

## 2014-01-22 NOTE — Progress Notes (Signed)
Rehab Admissions Coordinator Note:  Patient was screened by Retta Diones for appropriateness for an Inpatient Acute Rehab Consult.  At this time, an inpatient rehab consult has already been ordered and is pending.  Retta Diones 01/22/2014, 9:53 AM  I can be reached at [phone].

## 2014-01-22 NOTE — Progress Notes (Signed)
CRITICAL VALUE ALERT  Critical value received:  Hgb 6.5 Date of notification:  01/22/14 Time of notification:  11:00 am  Critical value read back:yes  Nurse who received alert:  Alden Server  MD notified (1st page):  Bing Neighbors  Time of first page:  11:01 am  MD notified (2nd page):  Time of second page:  Responding MD: Bing Neighbors Time MD responded: 11:02 am

## 2014-01-23 ENCOUNTER — Encounter (HOSPITAL_COMMUNITY): Payer: Self-pay | Admitting: *Deleted

## 2014-01-23 ENCOUNTER — Inpatient Hospital Stay (HOSPITAL_COMMUNITY)
Admission: RE | Admit: 2014-01-23 | Discharge: 2014-02-07 | DRG: 945 | Disposition: A | Discharge status: Home Health | Payer: Medicaid Other | Source: Intra-hospital | Attending: Physical Medicine & Rehabilitation | Admitting: Physical Medicine & Rehabilitation

## 2014-01-23 DIAGNOSIS — G8929 Other chronic pain: Secondary | ICD-10-CM | POA: Diagnosis present

## 2014-01-23 DIAGNOSIS — Z5189 Encounter for other specified aftercare: Secondary | ICD-10-CM | POA: Diagnosis present

## 2014-01-23 DIAGNOSIS — J4489 Other specified chronic obstructive pulmonary disease: Secondary | ICD-10-CM

## 2014-01-23 DIAGNOSIS — C7951 Secondary malignant neoplasm of bone: Secondary | ICD-10-CM | POA: Diagnosis present

## 2014-01-23 DIAGNOSIS — G8100 Flaccid hemiplegia affecting unspecified side: Secondary | ICD-10-CM

## 2014-01-23 DIAGNOSIS — J449 Chronic obstructive pulmonary disease, unspecified: Secondary | ICD-10-CM | POA: Diagnosis present

## 2014-01-23 DIAGNOSIS — F17200 Nicotine dependence, unspecified, uncomplicated: Secondary | ICD-10-CM | POA: Diagnosis present

## 2014-01-23 DIAGNOSIS — I69959 Hemiplegia and hemiparesis following unspecified cerebrovascular disease affecting unspecified side: Secondary | ICD-10-CM | POA: Diagnosis not present

## 2014-01-23 DIAGNOSIS — R569 Unspecified convulsions: Secondary | ICD-10-CM

## 2014-01-23 DIAGNOSIS — C7952 Secondary malignant neoplasm of bone marrow: Secondary | ICD-10-CM | POA: Diagnosis present

## 2014-01-23 DIAGNOSIS — IMO0002 Reserved for concepts with insufficient information to code with codable children: Secondary | ICD-10-CM | POA: Diagnosis not present

## 2014-01-23 DIAGNOSIS — S98139A Complete traumatic amputation of one unspecified lesser toe, initial encounter: Secondary | ICD-10-CM

## 2014-01-23 DIAGNOSIS — N182 Chronic kidney disease, stage 2 (mild): Secondary | ICD-10-CM | POA: Diagnosis present

## 2014-01-23 DIAGNOSIS — I129 Hypertensive chronic kidney disease with stage 1 through stage 4 chronic kidney disease, or unspecified chronic kidney disease: Secondary | ICD-10-CM | POA: Diagnosis present

## 2014-01-23 DIAGNOSIS — Z8546 Personal history of malignant neoplasm of prostate: Secondary | ICD-10-CM

## 2014-01-23 DIAGNOSIS — J441 Chronic obstructive pulmonary disease with (acute) exacerbation: Secondary | ICD-10-CM

## 2014-01-23 DIAGNOSIS — G40802 Other epilepsy, not intractable, without status epilepticus: Secondary | ICD-10-CM | POA: Diagnosis present

## 2014-01-23 DIAGNOSIS — D638 Anemia in other chronic diseases classified elsewhere: Secondary | ICD-10-CM | POA: Diagnosis present

## 2014-01-23 DIAGNOSIS — E876 Hypokalemia: Secondary | ICD-10-CM | POA: Diagnosis present

## 2014-01-23 DIAGNOSIS — N179 Acute kidney failure, unspecified: Secondary | ICD-10-CM | POA: Diagnosis present

## 2014-01-23 DIAGNOSIS — M25569 Pain in unspecified knee: Secondary | ICD-10-CM | POA: Diagnosis present

## 2014-01-23 DIAGNOSIS — R630 Anorexia: Secondary | ICD-10-CM | POA: Diagnosis present

## 2014-01-23 DIAGNOSIS — K922 Gastrointestinal hemorrhage, unspecified: Secondary | ICD-10-CM | POA: Diagnosis not present

## 2014-01-23 DIAGNOSIS — I639 Cerebral infarction, unspecified: Secondary | ICD-10-CM

## 2014-01-23 DIAGNOSIS — F1010 Alcohol abuse, uncomplicated: Secondary | ICD-10-CM | POA: Diagnosis present

## 2014-01-23 DIAGNOSIS — K5900 Constipation, unspecified: Secondary | ICD-10-CM | POA: Diagnosis present

## 2014-01-23 DIAGNOSIS — R531 Weakness: Secondary | ICD-10-CM

## 2014-01-23 DIAGNOSIS — I10 Essential (primary) hypertension: Secondary | ICD-10-CM

## 2014-01-23 DIAGNOSIS — R5381 Other malaise: Secondary | ICD-10-CM | POA: Diagnosis present

## 2014-01-23 LAB — URINALYSIS, ROUTINE W REFLEX MICROSCOPIC
BILIRUBIN URINE: NEGATIVE
GLUCOSE, UA: NEGATIVE mg/dL
Hgb urine dipstick: NEGATIVE
KETONES UR: NEGATIVE mg/dL
LEUKOCYTES UA: NEGATIVE
Nitrite: NEGATIVE
PH: 7 (ref 5.0–8.0)
Protein, ur: NEGATIVE mg/dL
Specific Gravity, Urine: 1.013 (ref 1.005–1.030)
Urobilinogen, UA: 0.2 mg/dL (ref 0.0–1.0)

## 2014-01-23 LAB — PREPARE RBC (CROSSMATCH)

## 2014-01-23 LAB — CBC
HEMATOCRIT: 21 % — AB (ref 39.0–52.0)
HEMATOCRIT: 24.9 % — AB (ref 39.0–52.0)
HEMOGLOBIN: 7.1 g/dL — AB (ref 13.0–17.0)
HEMOGLOBIN: 8.2 g/dL — AB (ref 13.0–17.0)
MCH: 27.3 pg (ref 26.0–34.0)
MCH: 25.7 pg — ABNORMAL LOW (ref 26.0–34.0)
MCHC: 33.8 g/dL (ref 30.0–36.0)
MCHC: 32.9 g/dL (ref 30.0–36.0)
MCV: 80.8 fL (ref 78.0–100.0)
MCV: 78.1 fL (ref 78.0–100.0)
Platelets: 258 10*3/uL (ref 150–400)
Platelets: 274 10*3/uL (ref 150–400)
RBC: 2.6 MIL/uL — ABNORMAL LOW (ref 4.22–5.81)
RBC: 3.19 MIL/uL — AB (ref 4.22–5.81)
RDW: 13.3 % (ref 11.5–15.5)
RDW: 15.8 % — ABNORMAL HIGH (ref 11.5–15.5)
WBC: 7.9 10*3/uL (ref 4.0–10.5)
WBC: 7.3 10*3/uL (ref 4.0–10.5)

## 2014-01-23 LAB — UREA NITROGEN, URINE: Urea Nitrogen, Ur: 460 mg/dL

## 2014-01-23 MED ORDER — BIOTENE DRY MOUTH MT LIQD
15.0000 mL | Freq: Two times a day (BID) | OROMUCOSAL | Status: DC
  Administered 2014-01-24 – 2014-02-06 (×16): 15 mL via OROMUCOSAL

## 2014-01-23 MED ORDER — LISINOPRIL 2.5 MG PO TABS
2.5000 mg | ORAL_TABLET | Freq: Two times a day (BID) | ORAL | Status: DC
  Administered 2014-01-23 – 2014-01-30 (×15): 2.5 mg via ORAL
  Filled 2014-01-23 (×20): qty 1

## 2014-01-23 MED ORDER — SENNOSIDES-DOCUSATE SODIUM 8.6-50 MG PO TABS
2.0000 | ORAL_TABLET | Freq: Every day | ORAL | Status: DC
  Administered 2014-01-23 – 2014-01-25 (×3): 2 via ORAL
  Filled 2014-01-23 (×3): qty 2

## 2014-01-23 MED ORDER — PANTOPRAZOLE SODIUM 40 MG PO TBEC: 40.0000 mg | DELAYED_RELEASE_TABLET | Freq: Every day | ORAL | Status: DC

## 2014-01-23 MED ORDER — VITAMIN B-1 100 MG PO TABS
100.0000 mg | ORAL_TABLET | Freq: Every day | ORAL | Status: DC
  Administered 2014-01-24 – 2014-02-07 (×15): 100 mg via ORAL
  Filled 2014-01-23 (×16): qty 1

## 2014-01-23 MED ORDER — FERROUS SULFATE 325 (65 FE) MG PO TABS: 325.0000 mg | ORAL_TABLET | Freq: Two times a day (BID) | ORAL | Status: DC

## 2014-01-23 MED ORDER — PANTOPRAZOLE SODIUM 40 MG PO TBEC
40.0000 mg | DELAYED_RELEASE_TABLET | Freq: Every day | ORAL | Status: DC
  Administered 2014-01-23: 40 mg via ORAL
  Filled 2014-01-23: qty 1

## 2014-01-23 MED ORDER — PANTOPRAZOLE SODIUM 40 MG PO TBEC
40.0000 mg | DELAYED_RELEASE_TABLET | Freq: Every day | ORAL | Status: DC
  Administered 2014-01-24 – 2014-02-07 (×15): 40 mg via ORAL
  Filled 2014-01-23 (×15): qty 1

## 2014-01-23 MED ORDER — LEVETIRACETAM 750 MG PO TABS: 1500.0000 mg | ORAL_TABLET | Freq: Two times a day (BID) | ORAL | Status: DC

## 2014-01-23 MED ORDER — ALBUTEROL SULFATE (2.5 MG/3ML) 0.083% IN NEBU: 2.5000 mg | INHALATION_SOLUTION | Freq: Four times a day (QID) | RESPIRATORY_TRACT | Status: DC | PRN

## 2014-01-23 MED ORDER — ENOXAPARIN SODIUM 30 MG/0.3ML ~~LOC~~ SOLN
30.0000 mg | SUBCUTANEOUS | Status: DC
  Administered 2014-01-23 – 2014-01-29 (×7): 30 mg via SUBCUTANEOUS
  Filled 2014-01-23 (×8): qty 0.3

## 2014-01-23 MED ORDER — TIOTROPIUM BROMIDE MONOHYDRATE 18 MCG IN CAPS
18.0000 ug | ORAL_CAPSULE | Freq: Every day | RESPIRATORY_TRACT | Status: DC
  Administered 2014-01-24 – 2014-02-07 (×13): 18 ug via RESPIRATORY_TRACT
  Filled 2014-01-23 (×3): qty 5

## 2014-01-23 MED ORDER — FERROUS SULFATE 325 (65 FE) MG PO TABS
325.0000 mg | ORAL_TABLET | Freq: Two times a day (BID) | ORAL | Status: DC
  Administered 2014-01-24 – 2014-02-07 (×29): 325 mg via ORAL
  Filled 2014-01-23 (×31): qty 1

## 2014-01-23 MED ORDER — POLYETHYLENE GLYCOL 3350 17 G PO PACK
17.0000 g | PACK | Freq: Once | ORAL | Status: AC
  Administered 2014-01-23: 17 g via ORAL
  Filled 2014-01-23: qty 1

## 2014-01-23 MED ORDER — ACETAMINOPHEN 325 MG PO TABS: 325.0000 mg | ORAL_TABLET | ORAL | Status: DC | PRN

## 2014-01-23 MED ORDER — FOLIC ACID 1 MG PO TABS
1.0000 mg | ORAL_TABLET | Freq: Every day | ORAL | Status: DC
Start: 2014-01-24 — End: 2014-02-07
  Administered 2014-01-24 – 2014-02-07 (×15): 1 mg via ORAL
  Filled 2014-01-23 (×16): qty 1

## 2014-01-23 MED ORDER — GUAIFENESIN-DM 100-10 MG/5ML PO SYRP: 5.0000 mL | ORAL_SOLUTION | Freq: Four times a day (QID) | ORAL | Status: DC | PRN

## 2014-01-23 MED ORDER — ATORVASTATIN CALCIUM 80 MG PO TABS
80.0000 mg | ORAL_TABLET | Freq: Every day | ORAL | Status: DC
  Administered 2014-01-23 – 2014-02-06 (×15): 80 mg via ORAL
  Filled 2014-01-23 (×18): qty 1

## 2014-01-23 MED ORDER — DSS 100 MG PO CAPS: 100.0000 mg | ORAL_CAPSULE | Freq: Every day | ORAL | Status: DC

## 2014-01-23 MED ORDER — THIAMINE HCL 100 MG/ML IJ SOLN
100.0000 mg | Freq: Every day | INTRAMUSCULAR | Status: DC
  Filled 2014-01-23 (×5): qty 1

## 2014-01-23 MED ORDER — FLEET ENEMA 7-19 GM/118ML RE ENEM
1.0000 | ENEMA | Freq: Once | RECTAL | Status: AC
  Administered 2014-01-23: 1 via RECTAL
  Filled 2014-01-23: qty 1

## 2014-01-23 MED ORDER — CHLORHEXIDINE GLUCONATE 0.12 % MT SOLN
15.0000 mL | Freq: Two times a day (BID) | OROMUCOSAL | Status: DC
  Administered 2014-01-23 – 2014-02-07 (×27): 15 mL via OROMUCOSAL
  Filled 2014-01-23 (×32): qty 15

## 2014-01-23 MED ORDER — CLOPIDOGREL BISULFATE 75 MG PO TABS
75.0000 mg | ORAL_TABLET | Freq: Every day | ORAL | Status: DC
  Administered 2014-01-24 – 2014-02-07 (×15): 75 mg via ORAL
  Filled 2014-01-23 (×17): qty 1

## 2014-01-23 MED ORDER — BISACODYL 10 MG RE SUPP: 10.0000 mg | Freq: Every day | RECTAL | Status: DC | PRN

## 2014-01-23 MED ORDER — TRAZODONE HCL 50 MG PO TABS: 25.0000 mg | ORAL_TABLET | Freq: Every evening | ORAL | Status: DC | PRN

## 2014-01-23 MED ORDER — DOCUSATE SODIUM 100 MG PO CAPS
100.0000 mg | ORAL_CAPSULE | Freq: Every day | ORAL | Status: DC
  Administered 2014-01-23: 100 mg via ORAL
  Filled 2014-01-23: qty 1

## 2014-01-23 MED ORDER — PROCHLORPERAZINE 25 MG RE SUPP: 12.5000 mg | Freq: Four times a day (QID) | RECTAL | Status: DC | PRN

## 2014-01-23 MED ORDER — POTASSIUM CHLORIDE CRYS ER 20 MEQ PO TBCR
20.0000 meq | EXTENDED_RELEASE_TABLET | Freq: Once | ORAL | Status: AC
  Administered 2014-01-23: 20 meq via ORAL
  Filled 2014-01-23: qty 1

## 2014-01-23 MED ORDER — PROCHLORPERAZINE EDISYLATE 5 MG/ML IJ SOLN: 5.0000 mg | Freq: Four times a day (QID) | INTRAMUSCULAR | Status: DC | PRN

## 2014-01-23 MED ORDER — LEVETIRACETAM 750 MG PO TABS
1500.0000 mg | ORAL_TABLET | Freq: Two times a day (BID) | ORAL | Status: DC
Start: 2014-01-23 — End: 2014-02-07
  Administered 2014-01-23 – 2014-02-07 (×30): 1500 mg via ORAL
  Filled 2014-01-23 (×32): qty 2

## 2014-01-23 MED ORDER — PROCHLORPERAZINE MALEATE 5 MG PO TABS
5.0000 mg | ORAL_TABLET | Freq: Four times a day (QID) | ORAL | Status: DC | PRN
Start: 2014-01-23 — End: 2014-02-07

## 2014-01-23 MED ORDER — ALUM & MAG HYDROXIDE-SIMETH 200-200-20 MG/5ML PO SUSP: 30.0000 mL | ORAL | Status: DC | PRN

## 2014-01-23 MED ORDER — DICLOFENAC SODIUM 1 % TD GEL
2.0000 g | Freq: Four times a day (QID) | TRANSDERMAL | Status: DC
  Administered 2014-01-23 – 2014-02-07 (×37): 2 g via TOPICAL
  Filled 2014-01-23 (×2): qty 100

## 2014-01-23 MED ORDER — FLEET ENEMA 7-19 GM/118ML RE ENEM: 1.0000 | ENEMA | Freq: Once | RECTAL | Status: AC | PRN

## 2014-01-23 MED ORDER — ADULT MULTIVITAMIN W/MINERALS CH
1.0000 | ORAL_TABLET | Freq: Every day | ORAL | Status: DC
  Administered 2014-01-24 – 2014-02-01 (×9): 1 via ORAL
  Administered 2014-02-02: 10:00:00 via ORAL
  Administered 2014-02-03 – 2014-02-07 (×5): 1 via ORAL
  Filled 2014-01-23 (×16): qty 1

## 2014-01-23 NOTE — Progress Notes (Signed)
Physical Therapy Treatment Patient Details Name: [name] MRN: [id number] DOB: 1950-12-16 Today's Date: 01/23/2014    History of Present Illness History of Present Illness: [name] is a 63 y.o. male w/ PMHx of HTN, COPD, metastatic prostate CA, CKD stage II, h/o CVA, and seizures, presents to the ED after having a seizure at home.     PT Comments    Making progress with balance, amb, and showing carryover of cues and skills even within session; should do well at CIR  Follow Up Recommendations  CIR     Equipment Recommendations  Other (comment)    Recommendations for Other Services       Precautions / Restrictions Precautions Precautions: Fall    Mobility  Bed Mobility               General bed mobility comments: in chair  Transfers Overall transfer level: Needs assistance Equipment used: Rolling walker (2 wheeled);None Transfers: Sit to/from Stand Sit to Stand: Mod assist;Min assist         General transfer comment: Cues for technique, and to initiate sit to stand with anterior weight shift as he tends to have a significant posterior lean. Pt is anxious about standing due to decreased mobility for the last few months. Focused anterior lean to initiate sit to stand; performed at least 6-8 reps, and noted improved to min assist with a few of those reps when center of mass was properly translated over feet; Showed carryover throughout session  Ambulation/Gait Ambulation/Gait assistance: +2 physical assistance;Mod assist Ambulation Distance (Feet): 15 Feet (total, with 1 seated rest break) Assistive device: Rolling walker (2 wheeled) Gait Pattern/deviations: Step-to pattern;Staggering right;Decreased step length - left     General Gait Details: Cues for technique and phsyical assist/block for L hip and knee stability in stance to be able to advance R foot; Assist to advance L foot; Needed near constant cues to stop and regain balance in between  steps   Stairs            Wheelchair Mobility    Modified Rankin (Stroke Patients Only)       Balance Overall balance assessment: Needs assistance   Sitting balance-Leahy Scale: Fair Sitting balance - Comments: Balance progressed throughout the session. Pt required mod verbal and tactil cues at the beginning of tx to scoot to the edge of the chair and was able to do it with supervision at the end. Worked with pt on bilateral hand movements while seated in chair to work on sitting balance and increase LUE gross and fine motor movements.   Standing balance support: Bilateral upper extremity supported Standing balance-Leahy Scale: Poor Standing balance comment: heavy posterior lean with standing. Balance got progressively better throughout treatment but required VC to maintain anterior lean.                    Cognition Arousal/Alertness: Awake/alert Behavior During Therapy: WFL for tasks assessed/performed Overall Cognitive Status: Within Functional Limits for tasks assessed                      Exercises      General Comments        Pertinent Vitals/Pain no apparent distress     Home Living                      Prior Function            PT Goals (  current goals can now be found in the care plan section) Acute Rehab PT Goals Patient Stated Goal: Ready to do whatever is asked of him. PT Goal Formulation: With patient Time For Goal Achievement: 02/04/14 Potential to Achieve Goals: Good Progress towards PT goals: Progressing toward goals    Frequency  Min 3X/week    PT Plan Current plan remains appropriate    Co-evaluation PT/OT/SLP Co-Evaluation/Treatment: Yes Reason for Co-Treatment: For patient/therapist safety PT goals addressed during session: Mobility/safety with mobility;Balance;Proper use of DME OT goals addressed during session: ADL's and self-care;Strengthening/ROM     End of Session Equipment Utilized During  Treatment: Gait belt Activity Tolerance: Patient tolerated treatment well Patient left: in chair;with call bell/phone within reach;with chair alarm set     Time: 4469-5072 PT Time Calculation (min): 34 min  Charges:  $Gait Training: 8-22 mins $Therapeutic Activity: 8-22 mins                    G Codes:      Quin Hoop 01/23/2014, 1:59 PM  Roney Marion, [place] Pager [phone] Office [phone]

## 2014-01-23 NOTE — Progress Notes (Signed)
Report called to Marygrace Drought, RN and pt transported to inpatient rehab with all his belongings.

## 2014-01-23 NOTE — Progress Notes (Signed)
Occupational Therapy Treatment Patient Details Name: [name] MRN: [id number] DOB: Aug 28, 1950 Today's Date: 01/23/2014    History of present illness History of Present Illness: [name] is a 63 y.o. male w/ PMHx of HTN, COPD, metastatic prostate CA, CKD stage II, h/o CVA, and seizures, presents to the ED after having a seizure at home.    OT comments  Focus of today's co-treat was on standing and sitting balance, bilateral hand activities and grooming. Pt is progressing well towards goals and is eager to do therapy. Worked with pt on increasing his bilateral fine and gross motor coordination by folding paper. Pt was able to follow step-by-step instructions with min VC on how to fold the paper, but was unable to release of his pinch with his L UE above the surface. Pt's balance got progressively better throughout treatment and was willing to work hard the entire session. Pt would benefit from CIR to increase his strength and functional mobility for ADLs and IADLs.   Follow Up Recommendations  CIR    Equipment Recommendations  None recommended by OT    Recommendations for Other Services      Precautions / Restrictions Precautions Precautions: Fall       Mobility Bed Mobility               General bed mobility comments: in chair  Transfers Overall transfer level: Needs assistance Equipment used: Rolling walker (2 wheeled);None Transfers: Sit to/from Stand Sit to Stand: Mod assist;Min assist         General transfer comment: Cues for technique, and to initiate sit to stand with anterior weight shift as he tends to have a significant posterior lean. Pt is anxious about standing due to decreased mobility for the last few months. Focused anterior lean to initiate sit to stand; performed at least 6-8 reps, and noted improved to min assist with a few of those reps when center of mass was properly translated over feet; Showed carryover throughout session     Balance Overall balance assessment: Needs assistance   Sitting balance-Leahy Scale: Fair Sitting balance - Comments: Balance progressed throughout the session. Pt required mod verbal and tactil cues at the beginning of tx to scoot to the edge of the chair and was able to do it with supervision at the end. Worked with pt on bilateral hand movements while seated in chair to work on sitting balance and increase LUE gross and fine motor movements.   Standing balance support: Bilateral upper extremity supported Standing balance-Leahy Scale: Poor Standing balance comment: heavy posterior lean with standing. Balance got progressively better throughout treatment but required VC to maintain anterior lean.                   ADL Overall ADL's : Needs assistance/impaired     Grooming: Standing;Wash/dry hands;Moderate assistance Grooming Details (indicate cue type and reason): A to maintain standing balance         Upper Body Dressing : Sitting;Set up                   Functional mobility during ADLs: Moderate assistance General ADL Comments: Co-treat with PT to address standing balance for ADLs. Pt ambulated to the hall and stand at the sink for grooming with mod A for dynamic/static standing balance and VC for sequencing and to maintain upright posture. Pt able to put a 2nd gown around his back side with set up assist. Worked on bilateral hand movements  to increase LUE intentional movements. Pt was able to follow step-by-step instructions on how to make a paper plane and attempted to use his left hand to toss it. Pt was unable to release his pinch on the plane but made numerous attempts to thrown the plane. Pt is expected to d/c to CIR today.                Cognition   Behavior During Therapy: WFL for tasks assessed/performed Overall Cognitive Status: Within Functional Limits for tasks assessed                                    Pertinent Vitals/ Pain       No  c/o pain.         Frequency Min 3X/week     Progress Toward Goals  OT Goals(current goals can now be found in the care plan section)  Progress towards OT goals: Progressing toward goals  Acute Rehab OT Goals Patient Stated Goal: Ready to do whatever is asked of him. OT Goal Formulation: With patient Time For Goal Achievement: 02/05/14  Plan Discharge plan remains appropriate    Co-evaluation    PT/OT/SLP Co-Evaluation/Treatment: Yes Reason for Co-Treatment: For patient/therapist safety PT goals addressed during session: Mobility/safety with mobility;Balance;Proper use of DME OT goals addressed during session: ADL's and self-care;Strengthening/ROM      End of Session Equipment Utilized During Treatment: Gait belt;Rolling walker   Activity Tolerance Patient tolerated treatment well   Patient Left in chair;with call bell/phone within reach;with chair alarm set           Time:  -     Charges:    Lyda Perone 01/23/2014, 3:21 PM

## 2014-01-23 NOTE — PMR Pre-admission (Signed)
PMR Admission Coordinator Pre-Admission Assessment  Patient: [name] is an 63 y.o., male MRN: [id number] DOB: Oct 05, 1950 Height: 5\' 8"  (172.7 Pittman) Weight: 68.448 kg (150 lb 14.4 oz)              Insurance Information HMO: No    PPO:       PCP:       IPA:       80/20:       OTHER:   PRIMARY: Medicaid [name]      Policy#: [id number]      Subscriber: [name] Name:        Phone#:       Fax#:   Pre-Cert#:        Employer: Not empolyed Benefits:  Phone #: [phone]     Name: Automated Eff. Date: Eligible 01/22/14     Deduct:        Out of Pocket Max:        Life Max:   CIR:        SNF:   Outpatient:       Co-Pay:   Home Health:        Co-Pay:   DME:       Co-Pay:   Providers:     Emergency Contact Information Contact Information   Name Relation Home Work Mobile   [name],[name] Relative   [phone]   [name] [phone]  [phone]   [name],[name] Relative   [phone]     Current Medical History  Patient Admitting Diagnosis:  Deconditioned after seizures, old R CVA  History of Present Illness: A 63 y.o. 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 He was d/c four days ago and was awaiting mail order Rx of antiseizure medications and developed seizures X 2 on 06/14/1. He was admitted for workup due to seizures X 2 and reports of FTT. He was hypotensive in ED and was treated with IVF bolus. Anemia of chronic disease at baseline With Hgb 7.5. UDS negative. PT evaluation done past admission and CIR recommended due to balance deficits and left knee instability. Patient's cousin works and he's alone during the day. He feels that he has declined in the past month and he would like intensive therapies to return to prior level of activity.    Past Medical History  Past Medical History  Diagnosis Date  .  Hypertension 05/18/2007  . COPD 05/18/2007  . Elevated PSA 05/25/2007  . Prostate cancer   . Seizures 12/05/2013; 12/08/2013; 12/11/2013    new onset; recurrent/notes 12/12/2013  . 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 Stroke 12/05/2012  . Cerebral embolism with cerebral infarction     residual left sided weakness/notes 12/12/2013  . Protein-calorie malnutrition, severe     [name] 12/12/2013  . Chronic kidney disease (CKD), stage II (mild)     /notes 12/12/2013    Family History  family history includes Heart attack (age of onset: 42) in his mother; Liver disease in his mother. There is no history of Colon cancer.  Prior Rehab/Hospitalizations:  Was in CIR 2014 after CVA, at [facility] and recently in SNF.   Current Medications  Current facility-administered medications:acetaminophen (TYLENOL) suppository 650 mg, 650 mg, Rectal, Q6H PRN, Hester Mates, MD;  acetaminophen (TYLENOL) tablet 650 mg, 650 mg, Oral, Q6H PRN, Hester Mates, MD,  650 mg at 01/22/14 0424;  albuterol (PROVENTIL) (2.5 MG/3ML) 0.083% nebulizer solution 2.5 mg, 2.5 mg, Inhalation, Q6H PRN, Hester Mates, MD;  atorvastatin (LIPITOR) tablet 80 mg, 80 mg, Oral, Daily, Hester Mates, MD, 80 mg at 01/22/14 2100 clopidogrel (PLAVIX) tablet 75 mg, 75 mg, Oral, Q breakfast, Hester Mates, MD, 75 mg at 01/23/14 1003;  docusate sodium (COLACE) capsule 100 mg, 100 mg, Oral, Daily, Terry Emokpae, MD, 100 mg at 01/23/14 1021;  ferrous sulfate tablet 325 mg, 325 mg, Oral, BID WC, Hester Mates, MD, 325 mg at 16/10/96 0454;  folic acid (FOLVITE) tablet 1 mg, 1 mg, Oral, Daily, Hester Mates, MD, 1 mg at 01/23/14 1003 levETIRAcetam (KEPPRA) tablet 1,500 mg, 1,500 mg, Oral, BID, Terry Emokpae, MD, 1,500 mg at 01/23/14 1003;  LORazepam (ATIVAN) injection 1 mg, 1 mg, Intravenous, Q6H PRN, Hester Mates, MD;  LORazepam (ATIVAN) tablet 1 mg, 1 mg, Oral, Q6H PRN, Hester Mates, MD;  multivitamin with minerals tablet 1 tablet, 1 tablet, Oral,  Daily, Hester Mates, MD, 1 tablet at 01/23/14 1003 pantoprazole (PROTONIX) EC tablet 40 mg, 40 mg, Oral, Daily, Terry Emokpae, MD, 40 mg at 01/23/14 1021;  sodium chloride 0.9 % injection 3 mL, 3 mL, Intravenous, Q12H, Hester Mates, MD, 3 mL at 01/23/14 1003;  thiamine (B-1) injection 100 mg, 100 mg, Intravenous, Daily, Hester Mates, MD;  thiamine (VITAMIN B-1) tablet 100 mg, 100 mg, Oral, Daily, Hester Mates, MD, 100 mg at 01/23/14 1003 tiotropium [facility]) inhalation capsule 18 mcg, 18 mcg, Inhalation, Daily, Hester Mates, MD, 18 mcg at 01/23/14 0981  Patients Current Diet: Dysphagia  Precautions / Restrictions Precautions Precautions: Fall Restrictions Weight Bearing Restrictions: No   Prior Activity Level Household: Homebound, went out about 1 X every 3 weeks.  Home Assistive Devices / Equipment Home Assistive Devices/Equipment: Shower chair with back  Prior Functional Level Prior Function Level of Independence: Needs assistance Gait / Transfers Assistance Needed: Pt had been able to walk 30' with RW mod I but for grossly a week has been needing assist to transfer and using WC. after each seizure episode pt with progressing weakness per cousin ADL's / Homemaking Assistance Needed: assist to shower Communication / Swallowing Assistance Needed: Pt has no teeth, they were all extracted earlier this year.    Current Functional Level Cognition  Overall Cognitive Status: Within Functional Limits for tasks assessed Orientation Level: Oriented X4    Extremity Assessment (includes Sensation/Coordination)  Upper Extremity Assessment: Defer to OT evaluation (Dyscoordination and bradykinetic Rhand/UE)  Lower Extremity Assessment: LLE deficits/detail  LLE Deficits / Details: hip flexor and knee ext strength 2+/5; minimal active ankle motion    ADLs  Overall ADL's : Needs assistance/impaired Eating/Feeding: Independent;Sitting Grooming: Set up;Supervision/safety;Sitting Upper Body  Bathing: Set up;Supervision/ safety;Sitting Lower Body Bathing: Moderate assistance;Sit to/from stand Upper Body Dressing : Minimal assistance;Sitting Lower Body Dressing: Moderate assistance;Sit to/from stand Toilet Transfer: Maximal assistance;Stand-pivot (bed>recliner going to pt's left. Has to do in 'steps" (leg leg, block left leg, step with right leg)) Toileting- Clothing Manipulation and Hygiene: Total assistance;Sit to/from stand Functional mobility during ADLs: Maximal assistance    Mobility  Overal bed mobility: Needs Assistance Bed Mobility: Supine to Sit Supine to sit: Min guard;HOB elevated General bed mobility comments: With increased time    Transfers  Overall transfer level: Needs assistance Equipment used: Rolling walker (2 wheeled) Transfers: Sit to/from [facility] Sit to Stand: Mod assist (posterior lean)  Stand pivot transfers: Max assist General transfer comment: Cues for technique, and to initiate sit to stand with anterior weight shift as he tends to have a significant posterior lean; At initial stand, he will tend to let go of the RW and reach back for teh bed without warning; verbal and tactile cues for fully extending hips and to translate weight forward in standing    Ambulation / Gait / Stairs / Wheelchair Mobility  Ambulation/Gait Ambulation/Gait assistance: Mod assist;Max assist Ambulation Distance (Feet):  (1 step forwards and backwards) Assistive device: Rolling walker (2 wheeled) Gait Pattern/deviations: Step-to pattern;Decreased step length - right;Decreased stance time - left;Leaning posteriorly General Gait Details: Cues for technique and phsyical assist/block for L hip and knee stability in stance to be able to advance R foot; Assist to advance L foot    Posture / Balance Dynamic Sitting Balance Sitting balance - Comments: Worked on sitting balance, and then dynamic sitting balance for scooting; Difficulty with lateral weight  shifting to unweigh R and L hips for reciprocal scooting; Also difficulty with flexiing at waist and hips to translate weight over feet and off of hips for lateral scoot    Special needs/care consideration BiPAP/CPAP No CPM No Continuous Drip IV No Dialysis No        Life Vest No Oxygen No Special Bed No Trach Size No Wound Vac (area) No       Skin No                              Bowel mgmt: No BM in the last 4-5 days  Bladder mgmt: Voiding in urinal Diabetic mgmt No    Previous Home Environment Living Arrangements: Other relatives (cousin) Home Care Services: Other (Comment) (Cousin) Additional Comments: Living with his cousin, [name], who works; will have a caregiver with him from 11am-2pm during the day per his cousin  Discharge Living Setting Plans for Discharge Living Setting: House;Lives with (comment) (Lives with his cousin, [name].) Type of Home at Discharge: House Discharge Home Layout: Two level;Able to live on main level with bedroom/bathroom Alternate Level Stairs-Number of Steps: Flight Discharge Home Pittman: Stairs to enter Entrance Stairs-Number of Steps: 4 Does the patient have any problems obtaining your medications?: No  Social/Family/Support Systems Patient Roles: Other (Comment) (Has cousins, no children.) Contact Information: Self and [name], cousin and his POA Anticipated Caregiver: [name] - cousin  Anticipated Caregiver's Contact Information: [name] [phone] Ability/Limitations of Caregiver: [name] is a CNA.  She works. Caregiver Availability: Intermittent (Will have a caregiver 11 am to 2 pm.) Discharge Plan Discussed with Primary Caregiver: Yes Is Caregiver In Agreement with Plan?: Yes Does Caregiver/Family have Issues with Lodging/Transportation while Pt is in Rehab?: No  Goals/Additional Needs Patient/Family Goal for Rehab: PT/OT mod I goals Expected length of stay: 10-14 days Cultural Considerations:  [employer] Dietary Needs: Dys 3, thin liquids Equipment Needs: TBD Pt/Family Agrees to Admission and willing to participate: Yes Program Orientation Provided & Reviewed with Pt/Caregiver Including Roles  & Responsibilities: Yes  Decrease burden of Care through IP rehab admission:  N/A  Possible need for SNF placement upon discharge: Not planned  Patient Condition: This patient's condition remains as documented in the consult dated 01/22/14, in which the Rehabilitation Physician determined and documented that the patient's condition is appropriate for intensive rehabilitative care in an inpatient rehabilitation facility. Will admit to inpatient rehab today.  Preadmission Screen Completed By:  Terry Cipro  Pittman, 01/23/2014 11:08 AM ______________________________________________________________________   Discussed status with Dr. Naaman Pittman on 01/23/14 at 1118 and received telephone approval for admission today.  Admission Coordinator:  [name], time1118/Date06/16/15

## 2014-01-23 NOTE — Progress Notes (Signed)
Physical Medicine and Rehabilitation Consult  Reason for Consult: Seizures  Referring Physician: Dr. Lynnae January  HPI: [name] is a 63 y.o. 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 He was d/c four days ago and was awaiting mail order Rx of antiseizure medications and developed seizures X 2 on 06/14/1. He was admitted for workup due to seizures X 2 and reports of FTT. He was hypotensive in ED and was treated with IVF bolus. Anemia of chronic disease at baseline With Hgb 7.5. UDS negative. PT evaluation done past admission and CIR recommended due to balance deficits and left knee instability. Patient's cousin works and he's alone during the day. He feels that he has declined in the past month and he would like intensive therapies to return to prior level of activity.  Review of Systems  Respiratory: Positive for cough. Negative for shortness of breath.  Cardiovascular: Positive for palpitations. Negative for chest pain.  Musculoskeletal: Positive for joint pain and myalgias.  Neurological: Positive for focal weakness and weakness. Negative for headaches.   Past Medical History   Diagnosis  Date   .  Hypertension  05/18/2007   .  COPD  05/18/2007   .  Elevated PSA  05/25/2007   .  Prostate cancer    .  Seizures  12/05/2013; 12/08/2013; 12/11/2013     new onset; recurrent/notes 12/12/2013   .  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  Stroke  12/05/2012   .  Cerebral embolism with cerebral infarction      residual left sided weakness/notes 12/12/2013   .  Protein-calorie malnutrition, severe      [name] 12/12/2013   .  Chronic kidney disease (CKD), stage II (mild)      [name] 12/12/2013    Past Surgical History   Procedure  Laterality  Date   .  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  Multiple extractions with alveoloplasty  N/A  10/23/2013      Procedure: Extraction of tooth #'s 1,2,3,4,5,6,7,11,12,13,14,15,16,20,21,22,23,24,27,28,29,32 with alveoloplasty and bilateral maxillary tuberosity reductions; Surgeon: Lenn Cal, DDS; Location: [employer]; Service: Oral Surgery; Laterality: N/A;   .  Tibia fracture surgery  Right      pin   .  Tonsillectomy      Family History   Problem  Relation  Age of Onset   .  Liver disease  Mother    .  Heart attack  Mother  64   .  Colon cancer  Neg Hx     Social History: Lives with cousin. Was able to transfer with walker. Uses wheelchair for mobility but was able to walk short distances. Needed assist with ADLs. He reports that he has been smoking Cigarettes--one a week? He has a 20 pack-year smoking history. He has never used smokeless tobacco. He reports that he drinks alcohol--last 2 weeks ago. He reports that he does not use illicit drugs.  Allergies   Allergen  Reactions   .  Aspirin  Hives   .  Penicillins  Hives   .  Other  Other (See Comments)     Epidural  Other(seizure)    Medications Prior to Admission   Medication  Sig  Dispense  Refill   .  albuterol (PROVENTIL HFA;VENTOLIN HFA) 108 (90 BASE)  MCG/ACT inhaler  Inhale 2 puffs into the lungs every 6 (six) hours as needed for wheezing.  8.5 g  3   .  atorvastatin (LIPITOR) 80 MG tablet  Take 80 mg by mouth daily.     .  chlorhexidine (PERIDEX) 0.12 % solution  Use as directed 15 mLs in the mouth or throat 2 (two) times daily. Use after breakfast and at bedtime: Rinse and spit out excess, do not swallow     .  clopidogrel (PLAVIX) 75 MG tablet  Take 1 tablet (75 mg total) by mouth daily with breakfast.  90 tablet  0   .  hydrochlorothiazide (HYDRODIURIL) 25 MG tablet  Take 1 tablet (25 mg total) by mouth daily.  30 tablet  5   .  levETIRAcetam (KEPPRA) 500 MG tablet  Take 3 tablets (1,500 mg total) by mouth 2 (two) times daily.  540 tablet  1   .  lisinopril (PRINIVIL,ZESTRIL) 10 MG tablet  Take 1 tablet (10 mg total) by mouth  daily.  30 tablet  2   .  Multiple Vitamin (MULTIVITAMIN WITH MINERALS) TABS tablet  Take 1 tablet by mouth daily.     Marland Kitchen  tiotropium (SPIRIVA HANDIHALER) 18 MCG inhalation capsule  Place 1 capsule (18 mcg total) into inhaler and inhale daily.  30 capsule  5    Home:  Home Living  Family/patient expects to be discharged to:: Unsure  Living Arrangements: Other relatives  Additional Comments: Living with his cousin, [name], who works  Functional History:  Prior Function  Level of Independence: Needs assistance  Gait / Transfers Assistance Needed: Pt had been able to walk 7' with RW mod I but for grossly a week has been needing assist to transfer and using WC. after each seizure episode pt with progressing weakness per cousin  ADL's / Homemaking Assistance Needed: assist to shower  Communication / Swallowing Assistance Needed: Pt has no teeth, they were all extracted earlier this year.  Functional Status:  Mobility:  Bed Mobility  Overal bed mobility: Needs Assistance  Bed Mobility: Supine to Sit  Supine to sit: Min guard  General bed mobility comments: Minguard for safety, but able to get up without physical assist; Uses RLE to assist LLE off of bed, and pushes up with RUE  Transfers  Overall transfer level: Needs assistance  Equipment used: Rolling walker (2 wheeled)  Transfers: Sit to/from Stand  Sit to Stand: Mod assist  General transfer comment: Cues for technique, and to initiate sit to stand with anterior weight shift as he tends to have a significant posterior lean; At initial stand, he will tend to let go of the RW and reach back for teh bed without warning; verbal and tactile cues for fully extending hips and to translate weight forward in standing  Ambulation/Gait  Ambulation/Gait assistance: Mod assist;Max assist  Ambulation Distance (Feet): (1 step forwards and backwards)  Assistive device: Rolling walker (2 wheeled)  Gait Pattern/deviations: Step-to pattern;Decreased  step length - right;Decreased stance time - left;Leaning posteriorly  General Gait Details: Cues for technique and phsyical assist/block for L hip and knee stability in stance to be able to advance R foot; Assist to advance L foot   ADL:   Cognition:  Cognition  Overall Cognitive Status: Within Functional Limits for tasks assessed  Orientation Level: Oriented X4  Cognition  Arousal/Alertness: Awake/alert  Behavior During Therapy: WFL for tasks assessed/performed  Overall Cognitive Status: Within Functional Limits for tasks assessed  Blood pressure 109/65,  pulse 79, temperature 98.8 F (37.1 C), temperature source Oral, resp. rate 18, height 5\' 8"  (1.727 m), weight 68.448 kg (150 lb 14.4 oz), SpO2 97.00%.  Physical Exam  Nursing note and vitals reviewed.  Constitutional: He is oriented to person, place, and time.  Thin male  HENT:  Head: Normocephalic and atraumatic.  Eyes: Pupils are equal, round, and reactive to light. Right conjunctiva is injected. Left conjunctiva is injected.  Neck: Normal range of motion. Neck supple.  Cardiovascular: Normal rate and regular rhythm.  No murmur heard.  Respiratory: Effort normal and breath sounds normal. No respiratory distress.  GI: Soft. Bowel sounds are normal. He exhibits no distension.  Musculoskeletal: He exhibits no edema.  Hamstring discomfort with extension left knee. Large cyst left inferior patella region.  Neurological: He is alert and oriented to person, place, and time.  Soft wet sounding voice (baseline). Able to follow basic commands without difficulty. Lying in bed with LLE flexed. Tends to keep LUE flexed and poor effort with exam. Left sided weakness with few beats clonus left foot. LUE is 2 to 2+/5 deltoid, bicep, tricep. LLE is 3-HF, 3KE and 3 ankle dorsi/plantar. Decreased PP/LT LUE and LLE  Skin: Skin is warm and dry.  Psychiatric: He has a normal mood and affect. His behavior is normal.   Results for orders placed during  the hospital encounter of 01/20/14 (from the past 24 hour(s))   COMPREHENSIVE METABOLIC PANEL Status: Abnormal    Collection Time    01/21/14 12:00 PM   Result  Value  Ref Range    Sodium  142  137 - 147 mEq/L    Potassium  3.5 (*)  3.7 - 5.3 mEq/L    Chloride  107  96 - 112 mEq/L    CO2  24  19 - 32 mEq/L    Glucose, Bld  95  70 - 99 mg/dL    BUN  31 (*)  6 - 23 mg/dL    Creatinine, Ser  1.84 (*)  0.50 - 1.35 mg/dL    Calcium  9.8  8.4 - 10.5 mg/dL    Total Protein  7.6  6.0 - 8.3 g/dL    Albumin  3.3 (*)  3.5 - 5.2 g/dL    AST  25  0 - 37 U/L    ALT  22  0 - 53 U/L    Alkaline Phosphatase  70  39 - 117 U/L    Total Bilirubin  <0.2 (*)  0.3 - 1.2 mg/dL    GFR calc non Af Amer  37 (*)  >90 mL/min    GFR calc Af Amer  43 (*)  >90 mL/min   MAGNESIUM Status: None    Collection Time    01/21/14 12:00 PM   Result  Value  Ref Range    Magnesium  1.8  1.5 - 2.5 mg/dL   PHOSPHORUS Status: None    Collection Time    01/21/14 12:00 PM   Result  Value  Ref Range    Phosphorus  2.9  2.3 - 4.6 mg/dL    No results found.  Assessment/Plan:  Diagnosis: recurrent seizures, previous left hemiparesis after CVA, deconditioning  1. Does the need for close, 24 hr/day medical supervision in concert with the patient's rehab needs make it unreasonable for this patient to be served in a less intensive setting? Yes 2. Co-Morbidities requiring supervision/potential complications: copd, htn,  3. Due to bladder management, bowel management, safety, skin/wound care, disease  management, medication administration and pain management, does the patient require 24 hr/day rehab nursing? Yes 4. Does the patient require coordinated care of a physician, rehab nurse, PT (1-2 hrs/day, 5 days/week) and OT (1-2 hrs/day, 5 days/week) to address physical and functional deficits in the context of the above medical diagnosis(es)? Yes Addressing deficits in the following areas: balance, endurance, locomotion, strength,  transferring, bowel/bladder control, bathing, dressing, feeding, grooming, toileting and psychosocial support 5. Can the patient actively participate in an intensive therapy program of at least 3 hrs of therapy per day at least 5 days per week? Yes 6. The potential for patient to make measurable gains while on inpatient rehab is excellent 7. Anticipated functional outcomes upon discharge from inpatient rehab are modified independent with PT, modified independent with OT, n/a with SLP. 8. Estimated rehab length of stay to reach the above functional goals is: 10-14 days 9. Does the patient have adequate social supports to accommodate these discharge functional goals? Yes and Potentially 10. Anticipated D/C setting: Home 11. Anticipated post D/C treatments: [place] therapy 12. Overall Rehab/Functional Prognosis: good RECOMMENDATIONS:  This patient's condition is appropriate for continued rehabilitative care in the following setting: CIR  Patient has agreed to participate in recommended program. Yes  Note that insurance prior authorization may be required for reimbursement for recommended care.  Comment: Pt is very motivated to get stronger. Wants to participate in our program. Rehab Admissions Coordinator to follow up.  Thanks,  Meredith Staggers, MD, Mellody Drown  01/22/2014  Revision History...      Date/Time User Action    01/22/2014 1:55 PM Meredith Staggers, MD Sign    01/22/2014 9:22 AM Bary Leriche, PA-C Share   View Details Report    Routing History.Marland KitchenMarland Kitchen

## 2014-01-23 NOTE — Progress Notes (Signed)
Agree with note. 11:19-12:09 [place], 1TA 01/23/2014 Nestor Lewandowsky, OTR/L Pager: [phone]

## 2014-01-23 NOTE — Progress Notes (Signed)
PMR Admission Coordinator Pre-Admission Assessment  Patient: [name] is an 63 y.o., male  MRN: [id number]  DOB: [birth year]  Height: 5\' 8"  (172.7 cm)  Weight: 68.448 kg (150 lb 14.4 oz)  Insurance Information  HMO: No PPO: PCP: IPA: 80/20: OTHER:  PRIMARY: Medicaid [place] access Policy#: [id number] o Subscriber: Charlies Silvers  CM Name: Phone#: Fax#:  Pre-Cert#: Employer: Not empolyed  Benefits: Phone #: [phone] Name: Automated  Eff. Date: Eligible 01/22/14 Deduct: Out of Pocket Max: Life Max:  CIR: SNF:  Outpatient: Co-Pay:  Home Health: Co-Pay:  DME: Co-Pay:  Providers:  Emergency Contact Information  Contact Information    Name  Relation  Home  Work  Mobile    Graves,Crystal  Relative    [phone]    Georgette Shell  [phone]   [phone]    Graves,Bernadette  Relative    [phone]      Current Medical History  Patient Admitting Diagnosis: Deconditioned after seizures, old R CVA  History of Present Illness: A 63 y.o. 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 He was d/c four days ago and was awaiting mail order Rx of antiseizure medications and developed seizures X 2 on 06/14/1. He was admitted for workup due to seizures X 2 and reports of FTT. He was hypotensive in ED and was treated with IVF bolus. Anemia of chronic disease at baseline With Hgb 7.5. UDS negative. PT evaluation done past admission and CIR recommended due to balance deficits and left knee instability. Patient's cousin works and he's alone during the day. He feels that he has declined in the past month and he would like intensive therapies to return to prior level of activity.  Past Medical History  Past Medical History   Diagnosis  Date   .  Hypertension  05/18/2007   .  COPD  05/18/2007   .  Elevated PSA  05/25/2007   .  Prostate cancer    .   Seizures  12/05/2013; 12/08/2013; 12/11/2013     new onset; recurrent/notes 12/12/2013   .  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  Stroke  12/05/2012   .  Cerebral embolism with cerebral infarction      residual left sided weakness/notes 12/12/2013   .  Protein-calorie malnutrition, severe      [name] 12/12/2013   .  Chronic kidney disease (CKD), stage II (mild)      /notes 12/12/2013    Family History  family history includes Heart attack (age of onset: 41) in his mother; Liver disease in his mother. There is no history of Colon cancer.  Prior Rehab/Hospitalizations: Was in CIR 2014 after CVA, at [facility] and recently in SNF.  Current Medications  Current facility-administered medications:acetaminophen (TYLENOL) suppository 650 mg, 650 mg, Rectal, Q6H PRN, Hester Mates, MD; acetaminophen (TYLENOL) tablet 650 mg, 650 mg, Oral, Q6H PRN, Hester Mates, MD, 650 mg at 01/22/14 0424; albuterol (PROVENTIL) (2.5 MG/3ML) 0.083% nebulizer solution 2.5 mg, 2.5 mg, Inhalation, Q6H PRN, Hester Mates, MD; atorvastatin (LIPITOR) tablet 80 mg, 80 mg, Oral, Daily, Hester Mates, MD, 80 mg at 01/22/14 2100  clopidogrel (PLAVIX) tablet 75 mg, 75 mg, Oral, Q breakfast, Hester Mates, MD, 75 mg at 01/23/14 1003; docusate sodium (COLACE) capsule 100 mg, 100 mg, Oral, Daily, Ejiroghene Emokpae, MD, 100 mg  at 01/23/14 1021; ferrous sulfate tablet 325 mg, 325 mg, Oral, BID WC, Hester Mates, MD, 325 mg at 78/29/56 2130; folic acid (FOLVITE) tablet 1 mg, 1 mg, Oral, Daily, Hester Mates, MD, 1 mg at 01/23/14 1003  levETIRAcetam (KEPPRA) tablet 1,500 mg, 1,500 mg, Oral, BID, Ejiroghene Emokpae, MD, 1,500 mg at 01/23/14 1003; LORazepam (ATIVAN) injection 1 mg, 1 mg, Intravenous, Q6H PRN, Hester Mates, MD; LORazepam (ATIVAN) tablet 1 mg, 1 mg, Oral, Q6H PRN, Hester Mates, MD; multivitamin with minerals tablet 1 tablet, 1 tablet, Oral, Daily, Hester Mates, MD, 1 tablet at 01/23/14 1003  pantoprazole (PROTONIX) EC tablet 40  mg, 40 mg, Oral, Daily, Ejiroghene Emokpae, MD, 40 mg at 01/23/14 1021; sodium chloride 0.9 % injection 3 mL, 3 mL, Intravenous, Q12H, Hester Mates, MD, 3 mL at 01/23/14 1003; thiamine (B-1) injection 100 mg, 100 mg, Intravenous, Daily, Hester Mates, MD; thiamine (VITAMIN B-1) tablet 100 mg, 100 mg, Oral, Daily, Hester Mates, MD, 100 mg at 01/23/14 1003  tiotropium [facility]) inhalation capsule 18 mcg, 18 mcg, Inhalation, Daily, Hester Mates, MD, 18 mcg at 01/23/14 8657  Patients Current Diet: Dysphagia  Precautions / Restrictions  Precautions  Precautions: Fall  Restrictions  Weight Bearing Restrictions: No  Prior Activity Level  Household: Homebound, went out about 1 X every 3 weeks.  Home Assistive Devices / Equipment  Home Assistive Devices/Equipment: Shower chair with back  Prior Functional Level  Prior Function  Level of Independence: Needs assistance  Gait / Transfers Assistance Needed: Pt had been able to walk 30' with RW mod I but for grossly a week has been needing assist to transfer and using WC. after each seizure episode pt with progressing weakness per cousin  ADL's / Homemaking Assistance Needed: assist to shower  Communication / Swallowing Assistance Needed: Pt has no teeth, they were all extracted earlier this year.  Current Functional Level  Cognition  Overall Cognitive Status: Within Functional Limits for tasks assessed  Orientation Level: Oriented X4   Extremity Assessment  (includes Sensation/Coordination)  Upper Extremity Assessment: Defer to OT evaluation (Dyscoordination and bradykinetic Rhand/UE)  Lower Extremity Assessment: LLE deficits/detail  LLE Deficits / Details: hip flexor and knee ext strength 2+/5; minimal active ankle motion   ADLs  Overall ADL's : Needs assistance/impaired  Eating/Feeding: Independent;Sitting  Grooming: Set up;Supervision/safety;Sitting  Upper Body Bathing: Set up;Supervision/ safety;Sitting  Lower Body Bathing: Moderate assistance;Sit  to/from stand  Upper Body Dressing : Minimal assistance;Sitting  Lower Body Dressing: Moderate assistance;Sit to/from stand  Toilet Transfer: Maximal assistance;Stand-pivot (bed>recliner going to pt's left. Has to do in 'steps" (leg leg, block left leg, step with right leg))  Toileting- Clothing Manipulation and Hygiene: Total assistance;Sit to/from stand  Functional mobility during ADLs: Maximal assistance   Mobility  Overal bed mobility: Needs Assistance  Bed Mobility: Supine to Sit  Supine to sit: Min guard;HOB elevated  General bed mobility comments: With increased time   Transfers  Overall transfer level: Needs assistance  Equipment used: Rolling walker (2 wheeled)  Transfers: Sit to/from [facility]  Sit to Stand: Mod assist (posterior lean)  Stand pivot transfers: Max assist  General transfer comment: Cues for technique, and to initiate sit to stand with anterior weight shift as he tends to have a significant posterior lean; At initial stand, he will tend to let go of the RW and reach back for teh bed without warning; verbal and tactile cues for fully extending hips and  to translate weight forward in standing   Ambulation / Gait / Stairs / Wheelchair Mobility  Ambulation/Gait  Ambulation/Gait assistance: Mod assist;Max assist  Ambulation Distance (Feet): (1 step forwards and backwards)  Assistive device: Rolling walker (2 wheeled)  Gait Pattern/deviations: Step-to pattern;Decreased step length - right;Decreased stance time - left;Leaning posteriorly  General Gait Details: Cues for technique and phsyical assist/block for L hip and knee stability in stance to be able to advance R foot; Assist to advance L foot   Posture / Balance  Dynamic Sitting Balance  Sitting balance - Comments: Worked on sitting balance, and then dynamic sitting balance for scooting; Difficulty with lateral weight shifting to unweigh R and L hips for reciprocal scooting; Also difficulty with flexiing  at waist and hips to translate weight over feet and off of hips for lateral scoot   Special needs/care consideration  BiPAP/CPAP No  CPM No  Continuous Drip IV No  Dialysis No  Life Vest No  Oxygen No  Special Bed No  Trach Size No  Wound Vac (area) No  Skin No  Bowel mgmt: No BM in the last 4-5 days  Bladder mgmt: Voiding in urinal  Diabetic mgmt No   Previous Home Environment  Living Arrangements: Other relatives (cousin)  Home Care Services: Other (Comment) (Cousin)  Additional Comments: Living with his cousin, [name], who works; will have a caregiver with him from 11am-2pm during the day per his cousin  Discharge Living Setting  Plans for Discharge Living Setting: House;Lives with (comment) (Lives with his cousin, [name].)  Type of Home at Discharge: House  Discharge Home Layout: Two level;Able to live on main level with bedroom/bathroom  Alternate Level Stairs-Number of Steps: Flight  Discharge Home Access: Stairs to enter  Entrance Stairs-Number of Steps: 4  Does the patient have any problems obtaining your medications?: No  Social/Family/Support Systems  Patient Roles: Other (Comment) (Has cousins, no children.)  Contact Information: Self and Maryruth Bun, cousin and his POA  Anticipated Caregiver: [name] - cousin  Anticipated Caregiver's Contact Information: [name] [phone]  Ability/Limitations of Caregiver: [name] is a CNA. She works.  Caregiver Availability: Intermittent (Will have a caregiver 11 am to 2 pm.)  Discharge Plan Discussed with Primary Caregiver: Yes  Is Caregiver In Agreement with Plan?: Yes  Does Caregiver/Family have Issues with Lodging/Transportation while Pt is in Rehab?: No  Goals/Additional Needs  Patient/Family Goal for Rehab: PT/OT mod I goals  Expected length of stay: 10-14 days  Cultural Considerations: Baptist  Dietary Needs: Dys 3, thin liquids  Equipment Needs: TBD  Pt/Family Agrees to Admission and willing  to participate: Yes  Program Orientation Provided & Reviewed with Pt/Caregiver Including Roles & Responsibilities: Yes  Decrease burden of Care through IP rehab admission: N/A  Possible need for SNF placement upon discharge: Not planned  Patient Condition: This patient's condition remains as documented in the consult dated 01/22/14, in which the Rehabilitation Physician determined and documented that the patient's condition is appropriate for intensive rehabilitative care in an inpatient rehabilitation facility. Will admit to inpatient rehab today.  Preadmission Screen Completed By: Retta Diones, 01/23/2014 11:08 AM  ______________________________________________________________________  Discussed status with Dr. Naaman Plummer on 01/23/14 at 1118 and received telephone approval for admission today.  Admission Coordinator: Retta Diones, time1118/Date06/16/15  Cosigned by: Meredith Staggers, MD [01/23/2014 11:20 AM]

## 2014-01-23 NOTE — Progress Notes (Signed)
Patient ID: [name], male   DOB: 12-02-1950, 63 y.o.   MRN: [id number] Patient admitted to rehab at 1745 via hospital bed from [place]. Patient transported by Nurse and Nurse Tech with no signs or symptoms of distress. Adm

## 2014-01-23 NOTE — Progress Notes (Signed)
Rehab admissions - Evaluated for possible admission.  I met with patient and he would like to come to inpatient rehab.  I spoke with resident and patient is medically ready for rehab today.  Bed available and will admit to inpatient rehab today.  Call me for questions.  #[phone]

## 2014-01-23 NOTE — H&P (Signed)
Physical Medicine and Rehabilitation Admission H&P      Chief Complaint   Patient presents with   .  Recurrent seizures. Left hemiparesis s/p CVA, deconditioning.       HPI:  [name] is a 63 y.o. 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 He was d/c five days ago and was awaiting mail order Rx of antiseizure medications and developed seizures X 2 on 01/21/14. He was admitted for workup due to seizures X 2 and FTT. He was hypotensive in ED and was treated with IVF bolus for acute renal failure with BUN/Cr- 39/2.81.  Anemia of chronic disease with drop in Hgb to 6.5 yesterday and he was transfused with 2 units PRBC . Patient with villous adenoma excision 10/2013 and has been using NSAIDs for left knee pain. No reports of hematochezia. Neurology recommended continuing Keppra at current dose and no formal consult needed.  PT evaluation done past admission and CIR recommended due to balance deficits and left knee instability. Patient's cousin works and he's alone during the day. He feels that he has declined in the past month and he would like intensive therapies to return to prior level of activity.      Review of Systems  HENT: Negative for hearing loss.   Respiratory: Negative for cough and shortness of breath.   Cardiovascular: Negative for chest pain and palpitations.  Gastrointestinal: Positive for constipation. Negative for heartburn and nausea.  Genitourinary: Negative for urgency and frequency.  Musculoskeletal: Positive for joint pain (left knee) and myalgias.  Neurological: Positive for sensory change (numbess left side since stroke) and focal weakness (left sided). Negative for headaches.  Psychiatric/Behavioral: The patient does not have insomnia.        Past Medical History    Diagnosis  Date   .  Hypertension  05/18/2007   .  COPD  05/18/2007   .  Elevated PSA  05/25/2007   .  Prostate cancer     .  Seizures  12/05/2013; 12/08/2013; 12/11/2013       new onset; recurrent/notes 12/12/2013   .  Stroke  10/14/2010       Right centrum semiovale   .  Stroke  12/05/2012   .  Cerebral embolism with cerebral infarction         residual left sided weakness/notes 12/12/2013   .  Protein-calorie malnutrition, severe         [name] 12/12/2013   .  Chronic kidney disease (CKD), stage II (mild)         [name] 12/12/2013    Past Surgical History   Procedure  Laterality  Date   .  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  Multiple extractions with alveoloplasty  N/A  10/23/2013       Procedure: Extraction of tooth #'s 1,2,3,4,5,6,7,11,12,13,14,15,16,20,21,22,23,24,27,28,29,32 with alveoloplasty and bilateral maxillary tuberosity reductions;  Surgeon: Lenn Cal, DDS;  Location: [facility];  Service: Oral Surgery;  Laterality: N/A;   .  Tibia fracture surgery  Right         pin   .  Tonsillectomy        Family History   Problem  Relation  Age of Onset   .  Liver disease  Mother     .  Heart attack  Mother  60   .  Colon cancer  Neg Hx      Social History: Lives with cousin. Was able to transfer with walker. Uses wheelchair for mobility but was able to walk short distances. Needed assist with ADLs. He reports that he has been smoking Cigarettes--one a week? He has a 20 pack-year smoking history. He has never used smokeless tobacco. He reports that he drinks alcohol--last 2 weeks ago. He reports that he does not use illicit drugs.     Allergies   Allergen  Reactions   .  Aspirin  Hives   .  Penicillins  Hives   .  Other  Other (See Comments)       Epidural Other(seizure)    Medications Prior to Admission   Medication  Sig  Dispense  Refill   .  albuterol (PROVENTIL HFA;VENTOLIN HFA) 108 (90 BASE) MCG/ACT inhaler  Inhale 2 puffs into the lungs every 6 (six) hours as  needed for wheezing.   8.5 g   3   .  atorvastatin (LIPITOR) 80 MG tablet  Take 80 mg by mouth daily.         .  chlorhexidine (PERIDEX) 0.12 % solution  Use as directed 15 mLs in the mouth or throat 2 (two) times daily. Use after breakfast and at bedtime:  Rinse and spit out excess, do not swallow         .  clopidogrel (PLAVIX) 75 MG tablet  Take 1 tablet (75 mg total) by mouth daily with breakfast.   90 tablet   0   .  hydrochlorothiazide (HYDRODIURIL) 25 MG tablet  Take 1 tablet (25 mg total) by mouth daily.   30 tablet   5   .  levETIRAcetam (KEPPRA) 500 MG tablet  Take 3 tablets (1,500 mg total) by mouth 2 (two) times daily.   540 tablet   1   .  lisinopril (PRINIVIL,ZESTRIL) 10 MG tablet  Take 1 tablet (10 mg total) by mouth daily.   30 tablet   2   .  Multiple Vitamin (MULTIVITAMIN WITH MINERALS) TABS tablet  Take 1 tablet by mouth daily.         Marland Kitchen  tiotropium (SPIRIVA HANDIHALER) 18 MCG inhalation capsule  Place 1 capsule (18 mcg total) into inhaler and inhale daily.   30 capsule   5      Home: Home Living Family/patient expects to be discharged to:: Inpatient rehab Living Arrangements: Other relatives (cousin) Additional Comments: Living with his cousin, [name], who works; will have a caregiver with him from 11am-2pm during the day per his cousin    Functional History: Prior Function Level of Independence: Needs assistance Gait / Transfers Assistance Needed: Pt had been able to walk 30' with RW mod I but for grossly a week has been needing assist to transfer and using WC. after each seizure episode pt with progressing weakness per cousin ADL's / Homemaking Assistance Needed: assist to shower Communication / Swallowing Assistance Needed: Pt has no teeth, they were all extracted earlier this year.     Functional Status:   Mobility: Bed Mobility Overal bed mobility: Needs Assistance Bed Mobility: Supine to Sit Supine to sit: Min guard;HOB elevated General bed mobility  comments: With increased time Transfers Overall transfer level: Needs assistance Equipment used: Rolling walker (2 wheeled) Transfers: Sit to/from [facility] Sit to Stand: Mod assist (posterior lean) Stand pivot transfers: Max assist General transfer comment: Cues for  technique, and to initiate sit to stand with anterior weight shift as he tends to have a significant posterior lean; At initial stand, he will tend to let go of the RW and reach back for teh bed without warning; verbal and tactile cues for fully extending hips and to translate weight forward in standing Ambulation/Gait Ambulation/Gait assistance: Mod assist;Max assist Ambulation Distance (Feet):  (1 step forwards and backwards) Assistive device: Rolling walker (2 wheeled) Gait Pattern/deviations: Step-to pattern;Decreased step length - right;Decreased stance time - left;Leaning posteriorly General Gait Details: Cues for technique and phsyical assist/block for L hip and knee stability in stance to be able to advance R foot; Assist to advance L foot   ADL: ADL Overall ADL's : Needs assistance/impaired Eating/Feeding: Independent;Sitting Grooming: Set up;Supervision/safety;Sitting Upper Body Bathing: Set up;Supervision/ safety;Sitting Lower Body Bathing: Moderate assistance;Sit to/from stand Upper Body Dressing : Minimal assistance;Sitting Lower Body Dressing: Moderate assistance;Sit to/from stand Toilet Transfer: Maximal assistance;Stand-pivot (bed>recliner going to pt's left. Has to do in 'steps" (leg leg, block left leg, step with right leg)) Toileting- Clothing Manipulation and Hygiene: Total assistance;Sit to/from stand Functional mobility during ADLs: Maximal assistance   Cognition: Cognition Overall Cognitive Status: Within Functional Limits for tasks assessed Orientation Level: Oriented X4 Cognition Arousal/Alertness: Awake/alert Behavior During Therapy: WFL for tasks assessed/performed Overall  Cognitive Status: Within Functional Limits for tasks assessed   Physical Exam: Blood pressure 153/72, pulse 63, temperature 98.2 F (36.8 C), temperature source Oral, resp. rate 18, height _0  (1.727 m), weight 68.448 kg (150 lb 14.4 oz), SpO2 95.00%.   Constitutional: He is oriented to person, place, and time.  Thin male   HENT:   Head: Normocephalic and atraumatic.   Eyes: Pupils are equal, round, and reactive to light. Right conjunctiva is injected. Left conjunctiva is injected.   Neck: Normal range of motion. Neck supple.   Cardiovascular: Normal rate and regular rhythm.   No murmur heard.   Respiratory: Effort normal and breath sounds normal. No respiratory distress.   GI: Soft. Bowel sounds are normal. He exhibits no distension.  Musculoskeletal: He exhibits no edema.  Hamstring discomfort with extension left knee. Large cyst left inferior patella region.  Neurological: He is alert and oriented to person, place, and time.  Soft wet sounding voice with dysarthria (baseline). Able to follow basic commands without difficulty. Lying in bed with LLE crossed underneath right leg. Tends to keep LUE flexed and inconsistent effort with exam. Left sided weakness with few beats clonus left foot. LUE is 2 to 2+/5 deltoid, bicep, tricep. LLE is 3-HF, 3KE and 3 ankle dorsi/plantar. RUE and RUE grossly   4+/5. RLE: HF 3+/5, KE 4/5, ADF/APF 4/5. Marland Kitchen  Decreased PP/LT LUE and LLE (1/2)   Skin: Skin is warm and dry.  Psychiatric: He has a normal mood and affect. His behavior is normal.       Results for orders placed during the hospital encounter of 01/20/14 (from the past 48 hour(s))   COMPREHENSIVE METABOLIC PANEL     Status: Abnormal     Collection Time      01/21/14 12:00 PM       Result  Value  Ref Range     Sodium  142   137 - 147 mEq/L     Potassium  3.5 (*)  3.7 - 5.3 mEq/L     Chloride  107   96 - 112 mEq/L     CO2  24   19 - 32 mEq/L  Glucose, Bld  95   70 - 99 mg/dL     BUN  31  (*)  6 - 23 mg/dL     Creatinine, Ser  1.84 (*)  0.50 - 1.35 mg/dL     Calcium  9.8   8.4 - 10.5 mg/dL     Total Protein  7.6   6.0 - 8.3 g/dL     Albumin  3.3 (*)  3.5 - 5.2 g/dL     AST  25   0 - 37 U/L     ALT  22   0 - 53 U/L     Alkaline Phosphatase  70   39 - 117 U/L     Total Bilirubin  <0.2 (*)  0.3 - 1.2 mg/dL     GFR calc non Af Amer  37 (*)  >90 mL/min     GFR calc Af Amer  43 (*)  >90 mL/min     Comment:  (NOTE)        The eGFR has been calculated using the CKD EPI equation.        This calculation has not been validated in all clinical situations.        eGFR's persistently <90 mL/min signify possible Chronic Kidney        Disease.   MAGNESIUM     Status: None     Collection Time      01/21/14 12:00 PM       Result  Value  Ref Range     Magnesium  1.8   1.5 - 2.5 mg/dL   PHOSPHORUS     Status: None     Collection Time      01/21/14 12:00 PM       Result  Value  Ref Range     Phosphorus  2.9   2.3 - 4.6 mg/dL   BASIC METABOLIC PANEL     Status: Abnormal     Collection Time      01/22/14 10:21 AM       Result  Value  Ref Range     Sodium  142   137 - 147 mEq/L     Potassium  3.6 (*)  3.7 - 5.3 mEq/L     Chloride  108   96 - 112 mEq/L     CO2  24   19 - 32 mEq/L     Glucose, Bld  104 (*)  70 - 99 mg/dL     BUN  20   6 - 23 mg/dL     Creatinine, Ser  1.13   0.50 - 1.35 mg/dL     Comment:  DELTA CHECK NOTED     Calcium  9.8   8.4 - 10.5 mg/dL     GFR calc non Af Amer  67 (*)  >90 mL/min     GFR calc Af Amer  78 (*)  >90 mL/min     Comment:  (NOTE)        The eGFR has been calculated using the CKD EPI equation.        This calculation has not been validated in all clinical situations.        eGFR's persistently <90 mL/min signify possible Chronic Kidney        Disease.   CBC     Status: Abnormal     Collection Time      01/22/14 10:21 AM       Result  Value  Ref  Range     WBC  6.9   4.0 - 10.5 K/uL     RBC  2.42 (*)  4.22 - 5.81 MIL/uL     Hemoglobin  6.5  (*)  13.0 - 17.0 g/dL     Comment:  REPEATED TO VERIFY        CRITICAL RESULT CALLED TO, READ BACK BY AND VERIFIED WITH:        A.LARSON,RN 1053 01/22/14 CLARK,S     HCT  19.9 (*)  39.0 - 52.0 %     MCV  82.2   78.0 - 100.0 fL     MCH  26.9   26.0 - 34.0 pg     MCHC  32.7   30.0 - 36.0 g/dL     RDW  13.5   11.5 - 15.5 %     Platelets  300   150 - 400 K/uL   URINALYSIS, ROUTINE W REFLEX MICROSCOPIC     Status: None     Collection Time      01/22/14 11:44 AM       Result  Value  Ref Range     Color, Urine  YELLOW   YELLOW     APPearance  CLEAR   CLEAR     Specific Gravity, Urine  1.020   1.005 - 1.030     pH  5.5   5.0 - 8.0     Glucose, UA  NEGATIVE   NEGATIVE mg/dL     Hgb urine dipstick  NEGATIVE   NEGATIVE     Bilirubin Urine  NEGATIVE   NEGATIVE     Ketones, ur  NEGATIVE   NEGATIVE mg/dL     Protein, ur  NEGATIVE   NEGATIVE mg/dL     Urobilinogen, UA  0.2   0.0 - 1.0 mg/dL     Nitrite  NEGATIVE   NEGATIVE     Leukocytes, UA  NEGATIVE   NEGATIVE     Comment:  MICROSCOPIC NOT DONE ON URINES WITH NEGATIVE PROTEIN, BLOOD, LEUKOCYTES, NITRITE, OR GLUCOSE <1000 mg/dL.   PREPARE RBC (CROSSMATCH)     Status: None     Collection Time      01/22/14  1:17 PM       Result  Value  Ref Range     Order Confirmation  ORDER PROCESSED BY BLOOD BANK      UREA NITROGEN, URINE     Status: None     Collection Time      01/22/14  2:10 PM       Result  Value  Ref Range     Urea Nitrogen, Ur  460        Comment:  (NOTE)          No established reference range for random urine.        Performed at [facility], URINE, RANDOM     Status: None     Collection Time      01/22/14  2:11 PM       Result  Value  Ref Range     Creatinine, Urine  67.61      URINE RAPID DRUG SCREEN (HOSP PERFORMED)     Status: None     Collection Time      01/22/14  2:11 PM       Result  Value  Ref Range     Opiates  NONE DETECTED   NONE DETECTED  Cocaine  NONE DETECTED   NONE DETECTED      Benzodiazepines  NONE DETECTED   NONE DETECTED     Amphetamines  NONE DETECTED   NONE DETECTED     Tetrahydrocannabinol  NONE DETECTED   NONE DETECTED     Barbiturates  NONE DETECTED   NONE DETECTED     Comment:                DRUG SCREEN FOR MEDICAL PURPOSES        ONLY.  IF CONFIRMATION IS NEEDED        FOR ANY PURPOSE, NOTIFY LAB        WITHIN 5 DAYS.                      LOWEST DETECTABLE LIMITS        FOR URINE DRUG SCREEN        Drug Class       Cutoff (ng/mL)        Amphetamine      1000        Barbiturate      200        Benzodiazepine   604        Tricyclics       540        Opiates          300        Cocaine          300        THC              50   VITAMIN B12     Status: None     Collection Time      01/22/14  4:30 PM       Result  Value  Ref Range     Vitamin B-12  640   211 - 911 pg/mL     Comment:  Performed at [facility]     Status: None     Collection Time      01/22/14  4:30 PM       Result  Value  Ref Range     ABO/RH(D)  B POS        Antibody Screen  NEG        Sample Expiration  01/25/2014        Unit Number  [id number]        Blood Component Type  RED CELLS,LR        Unit division  00        Status of Unit  ISSUED,FINAL        Transfusion Status  OK TO TRANSFUSE        Crossmatch Result  Compatible        Unit Number  [id number]        Blood Component Type  RED CELLS,LR        Unit division  00        Status of Unit  ISSUED        Transfusion Status  OK TO TRANSFUSE        Crossmatch Result  Compatible      PREPARE RBC (CROSSMATCH)     Status: None     Collection Time      01/23/14  1:30 AM       Result  Value  Ref Range     Order Confirmation  ORDER PROCESSED BY BLOOD BANK      CBC     Status: Abnormal     Collection Time      01/23/14  3:30 AM       Result  Value  Ref Range     WBC  7.9   4.0 - 10.5 K/uL     RBC  2.60 (*)  4.22 - 5.81 MIL/uL     Hemoglobin  7.1 (*)  13.0 - 17.0 g/dL     HCT  21.0 (*)   39.0 - 52.0 %     MCV  80.8   78.0 - 100.0 fL     MCH  27.3   26.0 - 34.0 pg     MCHC  33.8   30.0 - 36.0 g/dL     RDW  13.3   11.5 - 15.5 %     Platelets  258   150 - 400 K/uL   CBC     Status: Abnormal     Collection Time      01/23/14  8:00 AM       Result  Value  Ref Range     WBC  7.3   4.0 - 10.5 K/uL     RBC  3.19 (*)  4.22 - 5.81 MIL/uL     Hemoglobin  8.2 (*)  13.0 - 17.0 g/dL     HCT  24.9 (*)  39.0 - 52.0 %     MCV  78.1   78.0 - 100.0 fL     MCH  25.7 (*)  26.0 - 34.0 pg     MCHC  32.9   30.0 - 36.0 g/dL     RDW  15.8 (*)  11.5 - 15.5 %     Platelets  274   150 - 400 K/uL    No results found.         Medical Problem List and Plan: 1. Functional deficits secondary to Recurrent seizures, h/o CVA with left hemiparesis, deconditioning. 2.  DVT Prophylaxis/Anticoagulation: Pharmaceutical: Lovenox 3. Pain Management: Will add Voltaren gel qid. Tylenol prn for pain   4. Mood: No signs of distress. LCSW to follow for evaluation and support.   5. Neuropsych: This patient is capable of making decisions on his own behalf. 6. Anemia of chronic disease: Will continue iron supplement. Monitor for signs of bleeding.   7. Acute renal failure: Improved after IVF and d/c of HCTZ/Lisinopril. Recheck in am. 8. Seizure disorder: will continue Keppra 1500 mg bid 9. COPD: stable on spiriva 10. H/o alcohol abuse: on folic acid and thiamine.   11. Hypokalemia: will give a dose of K dur today and recheck in am. 12. HTN: Will monitor every 8 hours. Blood pressures trending back up.  Continue to hold HCTZ. Will resume lisinopril at lower dose and monitor renal status.   13. Constipation: has not had a BM in a week. Schedule Senna S at bedtime and enema today.        Post Admission Physician Evaluation: Functional deficits secondary  to Recurrent seizures, H/o CVA with left hemiparesis. Deconditioning.   Patient is admitted to receive collaborative, interdisciplinary care between  the physiatrist, rehab nursing staff, and therapy team. Patient's level of medical complexity and substantial therapy needs in context of that medical necessity cannot be provided at a lesser intensity of care such as a SNF. Patient has experienced substantial functional loss from his/her baseline which was documented above under the "Functional History"  and "Functional Status" headings.  Judging by the patient's diagnosis, physical exam, and functional history, the patient has potential for functional progress which will result in measurable gains while on inpatient rehab.  These gains will be of substantial and practical use upon discharge  in facilitating mobility and self-care at the household level. Physiatrist will provide 24 hour management of medical needs as well as oversight of the therapy plan/treatment and provide guidance as appropriate regarding the interaction of the two. 24 hour rehab nursing will assist with bladder management, bowel management, safety, skin/wound care, disease management, medication administration, pain management and patient education  and help integrate therapy concepts, techniques,education, etc. PT will assess and treat for/with: Lower extremity strength, range of motion, stamina, balance, functional mobility, safety, adaptive techniques and equipment, NMR, caregiver training, ego support, leisure awareness.   Goals are: mod I to  supervision. OT will assess and treat for/with: ADL's, functional mobility, safety, upper extremity strength, adaptive techniques and equipment, NMR, stamina, caregiver education, coping.   Goals are: supervision to mod I. SLP will assess and treat for/with: n/a.  Goals are: n/a. (baseline dysarthria) Case Management and Social Worker will assess and treat for psychological issues and discharge planning. Team conference will be held weekly to assess progress toward goals and to determine barriers to discharge. Patient will receive at least 3  hours of therapy per day at least 5 days per week. ELOS: 10-14 days        Prognosis:  good         Meredith Staggers, MD, [facility]  01/23/2014

## 2014-01-23 NOTE — Discharge Summary (Signed)
Name: [name] MRN: [id number] DOB: 08/28/1950 63 y.o. PCP: Corky Sox, MD  Date of Admission: 01/20/2014 10:44 PM Date of Discharge: 01/23/2014 Attending Physician: Bartholomew Crews, MD  Discharge Diagnosis: Principal Problem:   Acute-on-chronic kidney injury Active Problems:   COPD   History of stroke   Alcohol abuse   Hypokalemia   Seizures   Anemia, chronic disease   HTN (hypertension)  Discharge Medications:   Medication List         albuterol 108 (90 BASE) MCG/ACT inhaler  Commonly known as:  PROVENTIL HFA;VENTOLIN HFA  Inhale 2 puffs into the lungs every 6 (six) hours as needed for wheezing.     atorvastatin 80 MG tablet  Commonly known as:  LIPITOR  Take 80 mg by mouth daily.     chlorhexidine 0.12 % solution  Commonly known as:  PERIDEX  Use as directed 15 mLs in the mouth or throat 2 (two) times daily. Use after breakfast and at bedtime:  Rinse and spit out excess, do not swallow     clopidogrel 75 MG tablet  Commonly known as:  PLAVIX  Take 1 tablet (75 mg total) by mouth daily with breakfast.     DSS 100 MG Caps  Take 100 mg by mouth daily.     ferrous sulfate 325 (65 FE) MG tablet  Take 1 tablet (325 mg total) by mouth 2 (two) times daily with a meal.     hydrochlorothiazide 25 MG tablet  Commonly known as:  HYDRODIURIL  Take 1 tablet (25 mg total) by mouth daily.     levETIRAcetam 750 MG tablet  Commonly known as:  KEPPRA  Take 2 tablets (1,500 mg total) by mouth 2 (two) times daily.     lisinopril 10 MG tablet  Commonly known as:  PRINIVIL,ZESTRIL  Take 1 tablet (10 mg total) by mouth daily.     multivitamin with minerals Tabs tablet  Take 1 tablet by mouth daily.     pantoprazole 40 MG tablet  Commonly known as:  PROTONIX  Take 1 tablet (40 mg total) by mouth daily.     tiotropium 18 MCG inhalation capsule  Commonly known as:  SPIRIVA HANDIHALER  Place 1 capsule (18 mcg total) into inhaler and inhale daily.         Disposition and follow-up:   [name] was discharged from [facility] in Good condition.  At the hospital follow up visit please address:  1.  CBC- Hgb, pt has iron deficiency anemia, likely GI loss. Compliance with Keppra. Pt to follow up with Neurology in 1-2 months, as an out-pt, on discharge from rehab.  2.  Labs / imaging needed at time of follow-up: CBC- Hgb  3.  Pending labs/ test needing follow-up: None.  Follow-up Appointments:   Discharge Instructions: Discharge Instructions   Diet - low sodium heart healthy    Complete by:  As directed      Discharge instructions    Complete by:  As directed   Please take your keppra- 1500mg  twice a day. I have changed the strenght of each tablet so you will now take 2 tablets twice a day. It is very important you take this medication as prescribed, to prevent more seizures.  Also we prescribed some iron pills for you, take one tablet twice a day. This medication can cause constipation, we have therefore prescribed a stool softner for you, which you will take once a day.  We also prescribed a medication to help prevent stomach ulcers- Called Protonix or pantoprazole, take 1 tablet once a day.  You can restart your blood pressure medications.     Driving Restrictions    Complete by:  As directed   No driving, until you have been cleared by your doctor. This is because of your seizures.     Increase activity slowly    Complete by:  As directed            Consultations:  CIR  Procedures Performed:  No results found.  2D Echo: None  Cardiac Cath: None  Admission HPI: Chief Complaint: Seizure   History of Present Illness: [name] is a 63 y.o. male w/ PMHx of HTN, COPD, metastatic prostate CA, CKD stage II, h/o CVA, and seizures, presents to the ED after having a seizure at home. The patient claims he has at home sitting in a chair, accompanies by his cousin, when he says he had seizure-like  activity, shaking in his chair, lasting for about 1-2 minutes. He denies losing consciousness or having bowel or bladder incontinence, however, according to the ED notes, the patient did have urinary incontinence. No tongue biting. The patient denies any new deficits, no new weakness or sensory losses. Patient has a h/o multiple CVA's, as recently as 11/23/13 seen by MRI, w/ residual left-sided weakness, however this is unchanged according to the patient. He does claim he is having quite a bit of difficulty walking these days and uses a walker w/some difficulty. Patient recently returned home from [place] after being discharged from [facility] on 12/15/13 for seizures at that time as well. Patient takes Keppra 1500 mg bid w/ h/o non-compliance in the past. According to previous notes, patient was discharged on 1500 mg (3 tablets) two times daily, but according to the patient, he has only been taking one tablet 2 times daily. The patient also claims he has had decreased po intake at home recently, saying he has had poor appetite and very little fluid intake. He also claims he has been having difficulty making it to the bathroom in the recent past 2/2 his left-sided weakness. The patient has a h/o alcohol abuse in the past, says his most recent drink was one beer last Wednesday. He claims he has not had anything to drink for quite a while however, as he has been at [employer]. Patient otherwise denies recreational drug use.    Hospital Course by problem list:   # Seizure - Several previous episodes. Most likely due to medication Non compliance, pt is supposed to be taking 1500mg  BID, but was taking only  500mg  BID. Previous Hx of same. Unlikely due to alcohol withdrawal, last drink was 4 days prior to admission. Bmet showed K- 3.4, with elevated BUN and Cr, Mag- 1.8, Phos- 2.9. Unlikely pt had another stroke, as pts deficits were at baseline. Pts keppra was held initially as pt had an AKI, and placed on CIWA  protocol. Neurology was consulted informally and recs were to continue present management, pt to follow up as an out-pt with neurology. Pt was therefore discharged to CIR no his home dose of Keppra- 1500mg  BID.   # Acute Kidney Injury on CKD - Most likely Prerenal etiology - poor Po intake, and probably developed ATN with Lisinopril and NSAIDS. Cr elevated at 2.8, from baseline Cr around 1.2-1.5. The patient has a history of mild dysphagia secondary to prior strokes, and is currently on a dys  3 diet at home. Patient was hypotensive admission, BP as low as 74/62 in the ED. Lisinopril and HCTZ were held, and Pt was adequately hydrated with IVFs.   # Chronic anemia, likely from Gi loss- The patient has a history of chronic anemia. Hb on admission was 7.5. Pts baseline is 7- 8. Iron studies 12/2013 showed a ferritin of 16, and an iron saturation of 8%, indicating iron deficiency anemia. Colonoscopy 10/2013 revealed 4 polyps, which were removed (tubular adenomas and sessile adenomas).  PO iron supplementation was started- Ferrous sulfate 325 mg bid. Hgb dropped to 6.5 on admission, was given 2 units of blood. Post transfusion- 8.2. Pt was started on protonix- 40mg  daily, and stool softners- Docusate- 100mg  daily. Pt was discharged to In-Pt rehab.   # H/o CVA - Mr. Roepke has extensive h/o CVA, as recently as 11/2013, resulting in residual left-sided weakness. Patient claims he has difficulty walking 2/2 left leg weakness- Strenght- 5/5 on the Right, 4/5 on the left, but claims this is generally unchanged from previously. No new deficits. Plavix 75 mg po qd and Lipitor 80 mg po qhs was continued on admission and on discharge. Pt was evaluated by PT, OT and recs were for In patient rehab. Pt was evaluated and found to be a candidate and was therefore discharged to CIR.  # HTN- Pt was hypotensive on admission, blood pressure meds were initially held. Bp meds restarted on discharge.   # COPD - Was at baseline. Continue  Spiriva + Albuterol on discharge.  Discharge Vitals:   BP 153/72  Pulse 63  Temp(Src) 98.2 F (36.8 C) (Oral)  Resp 18  Ht 5\' 8"  (1.727 m)  Wt 150 lb 14.4 oz (68.448 kg)  BMI 22.95 kg/m2  SpO2 95%  Discharge Labs:  Results for orders placed during the hospital encounter of 01/20/14 (from the past 24 hour(s))  URINALYSIS, ROUTINE W REFLEX MICROSCOPIC     Status: None   Collection Time    01/22/14 11:44 AM      Result Value Ref Range   Color, Urine YELLOW  YELLOW   APPearance CLEAR  CLEAR   Specific Gravity, Urine 1.020  1.005 - 1.030   pH 5.5  5.0 - 8.0   Glucose, UA NEGATIVE  NEGATIVE mg/dL   Hgb urine dipstick NEGATIVE  NEGATIVE   Bilirubin Urine NEGATIVE  NEGATIVE   Ketones, ur NEGATIVE  NEGATIVE mg/dL   Protein, ur NEGATIVE  NEGATIVE mg/dL   Urobilinogen, UA 0.2  0.0 - 1.0 mg/dL   Nitrite NEGATIVE  NEGATIVE   Leukocytes, UA NEGATIVE  NEGATIVE  PREPARE RBC (CROSSMATCH)     Status: None   Collection Time    01/22/14  1:17 PM      Result Value Ref Range   Order Confirmation ORDER PROCESSED BY BLOOD BANK    UREA NITROGEN, URINE     Status: None   Collection Time    01/22/14  2:10 PM      Result Value Ref Range   Urea Nitrogen, Ur 460    CREATININE, URINE, RANDOM     Status: None   Collection Time    01/22/14  2:11 PM      Result Value Ref Range   Creatinine, Urine 67.61    URINE RAPID DRUG SCREEN (HOSP PERFORMED)     Status: None   Collection Time    01/22/14  2:11 PM      Result Value Ref Range   Opiates NONE DETECTED  NONE DETECTED   Cocaine NONE DETECTED  NONE DETECTED   Benzodiazepines NONE DETECTED  NONE DETECTED   Amphetamines NONE DETECTED  NONE DETECTED   Tetrahydrocannabinol NONE DETECTED  NONE DETECTED   Barbiturates NONE DETECTED  NONE DETECTED  VITAMIN B12     Status: None   Collection Time    01/22/14  4:30 PM      Result Value Ref Range   Vitamin B-12 640  211 - 911 pg/mL  TYPE AND SCREEN     Status: None   Collection Time    01/22/14  4:30  PM      Result Value Ref Range   ABO/RH(D) B POS     Antibody Screen NEG     Sample Expiration 01/25/2014     Unit Number [id number]     Blood Component Type RED CELLS,LR     Unit division 00     Status of Unit ISSUED,FINAL     Transfusion Status OK TO TRANSFUSE     Crossmatch Result Compatible     Unit Number [id number]     Blood Component Type RED CELLS,LR     Unit division 00     Status of Unit ISSUED     Transfusion Status OK TO TRANSFUSE     Crossmatch Result Compatible    PREPARE RBC (CROSSMATCH)     Status: None   Collection Time    01/23/14  1:30 AM      Result Value Ref Range   Order Confirmation ORDER PROCESSED BY BLOOD BANK    CBC     Status: Abnormal   Collection Time    01/23/14  3:30 AM      Result Value Ref Range   WBC 7.9  4.0 - 10.5 K/uL   RBC 2.60 (*) 4.22 - 5.81 MIL/uL   Hemoglobin 7.1 (*) 13.0 - 17.0 g/dL   HCT 21.0 (*) 39.0 - 52.0 %   MCV 80.8  78.0 - 100.0 fL   MCH 27.3  26.0 - 34.0 pg   MCHC 33.8  30.0 - 36.0 g/dL   RDW 13.3  11.5 - 15.5 %   Platelets 258  150 - 400 K/uL  CBC     Status: Abnormal   Collection Time    01/23/14  8:00 AM      Result Value Ref Range   WBC 7.3  4.0 - 10.5 K/uL   RBC 3.19 (*) 4.22 - 5.81 MIL/uL   Hemoglobin 8.2 (*) 13.0 - 17.0 g/dL   HCT 24.9 (*) 39.0 - 52.0 %   MCV 78.1  78.0 - 100.0 fL   MCH 25.7 (*) 26.0 - 34.0 pg   MCHC 32.9  30.0 - 36.0 g/dL   RDW 15.8 (*) 11.5 - 15.5 %   Platelets 274  150 - 400 K/uL    Signed: Jenetta Downer, MD 01/23/2014, 11:15 AM   Time Spent on Discharge: 35 minutes Services Ordered on Discharge: Discharge to [facility] in-pt rehab. Equipment Ordered on Discharge: None.

## 2014-01-23 NOTE — Progress Notes (Signed)
Subjective: Pt was transfused with 2 units of blood. No bleeding per rectum, no bloody stools or melana. Pt says last bowel movement- Saturday. No dizziness or epigastric pain. Pt says she takes at least 4 tablets of Alieve or advil daily for knee pain. Advised pt to use tylenol.   Objective: Vital signs in last 24 hours: Filed Vitals:   01/23/14 0459 01/23/14 0559 01/23/14 0659 01/23/14 0725  BP: 156/76 144/75 153/75   Pulse: 69 68 67   Temp: 98.2 F (36.8 C) 98.1 F (36.7 C) 97.9 F (36.6 C)   TempSrc: Oral Oral Oral   Resp: 18 18 18    Height:      Weight:      SpO2: 100% 97% 98% 95%   Weight change:   Intake/Output Summary (Last 24 hours) at 01/23/14 0939 Last data filed at 01/23/14 0344  Gross per 24 hour  Intake  587.5 ml  Output    200 ml  Net  387.5 ml   General appearance: Sitting in recliner, NAD. Head: Normocephalic, without obvious abnormality, atraumatic, EOMI. Lungs: Moving equal volumes of air, No added sounds Heart: Regular rate and rhythm, S1, S2 normal, no added sounds.  Abdomen: soft, non-tender; bowel sounds normal;  Extremities: extremities normal, atraumatic, no cyanosis or edema Neuro- Alert and oriented to person, place and time. Speech slurred, but at baseline, pt says its because he had no teeth, Strenght- 5/5- Right upper and lower extremities, 4/5 left upper and lower extremities. No new deficits.    Lab Results: Basic Metabolic Panel:  Recent Labs Lab 01/21/14 1200 01/22/14 1021  NA 142 142  K 3.5* 3.6*  CL 107 108  CO2 24 24  GLUCOSE 95 104*  BUN 31* 20  CREATININE 1.84* 1.13  CALCIUM 9.8 9.8  MG 1.8  --   PHOS 2.9  --    CBC:  Recent Labs Lab 01/23/14 0330 01/23/14 0800  WBC 7.9 7.3  HGB 7.1* 8.2*  HCT 21.0* 24.9*  MCV 80.8 78.1  PLT 258 274   Urine Drug Screen: Drugs of Abuse     Component Value Date/Time   LABOPIA NONE DETECTED 01/22/2014 1411   LABOPIA NEGATIVE 10/14/2010 2140   COCAINSCRNUR NONE DETECTED  01/22/2014 1411   COCAINSCRNUR  Value: POSITIVE (NOTE) Result repeated and verified. Sent for confirmatory testing* 10/14/2010 2140   LABBENZ NONE DETECTED 01/22/2014 1411   LABBENZ NEGATIVE 10/14/2010 2140   AMPHETMU NONE DETECTED 01/22/2014 1411   AMPHETMU NEGATIVE 10/14/2010 2140   THCU NONE DETECTED 01/22/2014 1411   LABBARB NONE DETECTED 01/22/2014 1411    Medications: I have reviewed the patient's current medications. Scheduled Meds: . atorvastatin  80 mg Oral Daily  . clopidogrel  75 mg Oral Q breakfast  . docusate sodium  100 mg Oral Daily  . ferrous sulfate  325 mg Oral BID WC  . folic acid  1 mg Oral Daily  . levETIRAcetam  1,500 mg Oral BID  . multivitamin with minerals  1 tablet Oral Daily  . pantoprazole  40 mg Oral Daily  . sodium chloride  3 mL Intravenous Q12H  . thiamine  100 mg Oral Daily   Or  . thiamine  100 mg Intravenous Daily  . tiotropium  18 mcg Inhalation Daily   Continuous Infusions:   PRN Meds:.acetaminophen, acetaminophen, albuterol, LORazepam, LORazepam Assessment/Plan:  # Seizure - Likely from medication non compliance, focus from previous stroke. No more episodes since admission. Mag- 1.8. - Bmet today with  normal Cr- Restart home Keppra- 1500mg  BID. - CIWA protocol  - Talked with neurology, can restart normal dose, no change in therapy, formal consult not needed. - Discharge today, pending when bed is available in CIR.  # Acute Kidney Injury on CKD - Resolved. Cr today- 1.13. >> 2.8. Likely from Poor PO intake prior to admission, lisinopril use. Hypotensive on admission, BP as low as 74/62 in the ED.  - Restart Hold lisinopril, HCTZ   HTN- Bp today- 153/75. Will restart home blood pressure meds.  - Lisinopril 10mg  daily - HCTZ- 25mg  daily.  # Iron defc anemia- Chronic - Hgb- 6.5 today. Baseline- 7-8. The patient has a history of chronic anemia. Colonoscopy 10/2013 revealed 4 polyps, which were removed (tubular adenomas, sessile polyps), diverticulosis,  and internal hemorrhoids.. Occult bleeding Likely from diverticulosis and/or internal hemorrhoids. Also reportedly takes at least 4 tabs of alieve and advil daily for knee pain. Post transfusion Hgb- 7.1. Pt therefore received 2 units of blood. Post transfusion Cbc- 8.2 today. - FOBT- Pending - Cont Ferrous sulfate 325 mg bid  - Protonix 40mg  daily for 4 weeks. - Stool softner- Docusate- 100mg  daily.  # H/o CVA - Mr. Denn has extensive h/o CVA, as recently as 11/2013, resulting in residual left-sided weakness. No new deficits. Pt mostly dependent for ADL. Evaluation by CIR, pt is a candidate. -Continue Plavix 75 mg po qd + Lipitor 80 mg po qhs  - Discharge pending when bed is available, pt is medically stable for discharge.  # COPD - At baseline. No SOB.  -Continue Spiriva + Albuterol   DVT/PE PPx- SCD's.  Dispo: Disposition is deferred at this time, awaiting improvement of current medical problems.    The patient does have a current PCP Corky Sox, MD) and does need an [facility] hospital follow-up appointment after discharge.  The patient does not know have transportation limitations that hinder transportation to clinic appointments.  .Services Needed at time of discharge: Y = Yes, Blank = No PT:   OT:   RN:   Equipment:   Other:     LOS: 3 days   Jenetta Downer, MD 01/23/2014, 9:39 AM

## 2014-01-24 ENCOUNTER — Inpatient Hospital Stay (HOSPITAL_COMMUNITY): Payer: Medicaid Other

## 2014-01-24 ENCOUNTER — Inpatient Hospital Stay (HOSPITAL_COMMUNITY): Payer: Medicaid Other | Admitting: Occupational Therapy

## 2014-01-24 ENCOUNTER — Inpatient Hospital Stay (HOSPITAL_COMMUNITY): Payer: Self-pay | Admitting: Physical Therapy

## 2014-01-24 ENCOUNTER — Inpatient Hospital Stay (HOSPITAL_COMMUNITY): Payer: Medicaid Other | Admitting: *Deleted

## 2014-01-24 ENCOUNTER — Inpatient Hospital Stay (HOSPITAL_COMMUNITY): Payer: Self-pay

## 2014-01-24 DIAGNOSIS — J449 Chronic obstructive pulmonary disease, unspecified: Secondary | ICD-10-CM

## 2014-01-24 DIAGNOSIS — R569 Unspecified convulsions: Secondary | ICD-10-CM

## 2014-01-24 DIAGNOSIS — R5381 Other malaise: Secondary | ICD-10-CM

## 2014-01-24 DIAGNOSIS — G8100 Flaccid hemiplegia affecting unspecified side: Secondary | ICD-10-CM

## 2014-01-24 LAB — CBC WITH DIFFERENTIAL/PLATELET
BASOS ABS: 0 10*3/uL (ref 0.0–0.1)
BASOS PCT: 0 % (ref 0–1)
EOS ABS: 0.1 10*3/uL (ref 0.0–0.7)
Eosinophils Relative: 2 % (ref 0–5)
HCT: 28.3 % — ABNORMAL LOW (ref 39.0–52.0)
Hemoglobin: 9 g/dL — ABNORMAL LOW (ref 13.0–17.0)
Lymphocytes Relative: 38 % (ref 12–46)
Lymphs Abs: 3.1 10*3/uL (ref 0.7–4.0)
MCH: 26.5 pg (ref 26.0–34.0)
MCHC: 31.8 g/dL (ref 30.0–36.0)
MCV: 83.5 fL (ref 78.0–100.0)
Monocytes Absolute: 0.7 10*3/uL (ref 0.1–1.0)
Monocytes Relative: 8 % (ref 3–12)
NEUTROS PCT: 52 % (ref 43–77)
Neutro Abs: 4.2 10*3/uL (ref 1.7–7.7)
PLATELETS: 281 10*3/uL (ref 150–400)
RBC: 3.39 MIL/uL — AB (ref 4.22–5.81)
RDW: 16.7 % — ABNORMAL HIGH (ref 11.5–15.5)
WBC: 8 10*3/uL (ref 4.0–10.5)

## 2014-01-24 LAB — COMPREHENSIVE METABOLIC PANEL
ALBUMIN: 2.9 g/dL — AB (ref 3.5–5.2)
ALT: 15 U/L (ref 0–53)
AST: 17 U/L (ref 0–37)
Alkaline Phosphatase: 63 U/L (ref 39–117)
BUN: 10 mg/dL (ref 6–23)
CO2: 27 mEq/L (ref 19–32)
Calcium: 9.8 mg/dL (ref 8.4–10.5)
Chloride: 107 mEq/L (ref 96–112)
Creatinine, Ser: 0.94 mg/dL (ref 0.50–1.35)
GFR calc Af Amer: >90 mL/min (ref >90)
GFR calc non Af Amer: 86 mL/min — ABNORMAL LOW (ref >90)
Glucose, Bld: 91 mg/dL (ref 70–99)
POTASSIUM: 4.1 meq/L (ref 3.7–5.3)
SODIUM: 142 meq/L (ref 137–147)
TOTAL PROTEIN: 6.8 g/dL (ref 6.0–8.3)
Total Bilirubin: <0.2 mg/dL — ABNORMAL LOW (ref 0.3–1.2)

## 2014-01-24 LAB — TYPE AND SCREEN
ABO/RH(D): B POS
ANTIBODY SCREEN: NEGATIVE
UNIT DIVISION: 0
Unit division: 0

## 2014-01-24 NOTE — Progress Notes (Signed)
63 y.o. 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 He was d/c five days ago and was awaiting mail order Rx of antiseizure medications and developed seizures X 2 on 01/21/14. He was admitted for workup due to seizures X 2 and FTT. He was hypotensive in ED and was treated with IVF bolus for acute renal failure with BUN/Cr- 39/2.81. Anemia of chronic disease with drop in Hgb to 6.5 yesterday and he was transfused with 2 units PRBC . Patient with villous adenoma excision 10/2013 and has been using NSAIDs for left knee pain. No reports of hematochezia. Neurology recommended continuing Keppra at current dose and no formal consult needed  Subjective/Complaints: No pain c/os, poor appetite, denies etoh at home  Review of Systems - Negative except left side weak and numb, poor appetite Objective: Vital Signs: Blood pressure 147/78, pulse 64, temperature 98 F (36.7 C), temperature source Oral, resp. rate 18, height 5' 8"  (1.727 m), weight 74.345 kg (163 lb 14.4 oz), SpO2 100.00%. No results found. Results for orders placed during the hospital encounter of 01/23/14 (from the past 72 hour(s))  URINALYSIS, ROUTINE W REFLEX MICROSCOPIC     Status: None   Collection Time    01/23/14  6:44 PM      Result Value Ref Range   Color, Urine YELLOW  YELLOW   APPearance CLEAR  CLEAR   Specific Gravity, Urine 1.013  1.005 - 1.030   pH 7.0  5.0 - 8.0   Glucose, UA NEGATIVE  NEGATIVE mg/dL   Hgb urine dipstick NEGATIVE  NEGATIVE   Bilirubin Urine NEGATIVE  NEGATIVE   Ketones, ur NEGATIVE  NEGATIVE mg/dL   Protein, ur NEGATIVE  NEGATIVE mg/dL   Urobilinogen, UA 0.2  0.0 - 1.0 mg/dL   Nitrite NEGATIVE  NEGATIVE   Leukocytes, UA NEGATIVE  NEGATIVE   Comment: MICROSCOPIC NOT DONE ON URINES WITH NEGATIVE PROTEIN, BLOOD, LEUKOCYTES, NITRITE,  OR GLUCOSE <1000 mg/dL.  CBC WITH DIFFERENTIAL     Status: Abnormal   Collection Time    01/24/14  6:03 AM      Result Value Ref Range   WBC 8.0  4.0 - 10.5 K/uL   RBC 3.39 (*) 4.22 - 5.81 MIL/uL   Hemoglobin 9.0 (*) 13.0 - 17.0 g/dL   HCT 28.3 (*) 39.0 - 52.0 %   MCV 83.5  78.0 - 100.0 fL   MCH 26.5  26.0 - 34.0 pg   MCHC 31.8  30.0 - 36.0 g/dL   RDW 16.7 (*) 11.5 - 15.5 %   Platelets 281  150 - 400 K/uL   Neutrophils Relative % 52  43 - 77 %   Neutro Abs 4.2  1.7 - 7.7 K/uL   Lymphocytes Relative 38  12 - 46 %   Lymphs Abs 3.1  0.7 - 4.0 K/uL   Monocytes Relative 8  3 - 12 %   Monocytes Absolute 0.7  0.1 - 1.0 K/uL   Eosinophils Relative 2  0 - 5 %   Eosinophils Absolute 0.1  0.0 - 0.7 K/uL   Basophils Relative 0  0 - 1 %   Basophils Absolute 0.0  0.0 - 0.1 K/uL  COMPREHENSIVE METABOLIC PANEL     Status: Abnormal   Collection Time    01/24/14  6:03 AM      Result  Value Ref Range   Sodium 142  137 - 147 mEq/L   Potassium 4.1  3.7 - 5.3 mEq/L   Chloride 107  96 - 112 mEq/L   CO2 27  19 - 32 mEq/L   Glucose, Bld 91  70 - 99 mg/dL   BUN 10  6 - 23 mg/dL   Creatinine, Ser 0.94  0.50 - 1.35 mg/dL   Calcium 9.8  8.4 - 10.5 mg/dL   Total Protein 6.8  6.0 - 8.3 g/dL   Albumin 2.9 (*) 3.5 - 5.2 g/dL   AST 17  0 - 37 U/L   ALT 15  0 - 53 U/L   Alkaline Phosphatase 63  39 - 117 U/L   Total Bilirubin <0.2 (*) 0.3 - 1.2 mg/dL   GFR calc non Af Amer 86 (*) >90 mL/min   GFR calc Af Amer >90  >90 mL/min   Comment: (NOTE)     The eGFR has been calculated using the CKD EPI equation.     This calculation has not been validated in all clinical situations.     eGFR's persistently <90 mL/min signify possible Chronic Kidney     Disease.     HEENT: edentulous Cardio: RRR and no murmurs Resp: CTA B/L and unlabored GI: BS positive and NT,ND Extremity:  Pulses positive and No Edema Skin:   Intact and Other healed G tube site Neuro: Alert/Oriented, Cranial Nerve II-XII normal, Abnormal  Sensory Reduced sensation to LT, LUE and LLE, Abnormal Motor 5/5 in BUE, 5/5 RLE, 4/5 L HF, KE, 2- L ankle DF and PF, Abnormal FMC Ataxic/ dec FMC and Dysarthric Musc/Skel:  Normal Gen NAD   Assessment/Plan: 1. Functional deficits secondary to deconditioning after recurrent seizures and GI bleed requiring transfusion which require 3+ hours per day of interdisciplinary therapy in a comprehensive inpatient rehab setting. Physiatrist is providing close team supervision and 24 hour management of active medical problems listed below. Physiatrist and rehab team continue to assess barriers to discharge/monitor patient progress toward functional and medical goals. FIM:                   Comprehension Comprehension Mode: Auditory Comprehension: 5-Follows basic conversation/direction: With no assist  Expression Expression Mode: Verbal Expression: 5-Expresses basic needs/ideas: With no assist  Social Interaction Social Interaction: 7-Interacts appropriately with others - No medications needed.  Problem Solving Problem Solving: 5-Solves basic problems: With no assist  Memory Memory: 6-More than reasonable amt of time  Medical Problem List and Plan:  1. Functional deficits secondary to Recurrent seizures, h/o CVA with left hemiparesis, deconditioning.  2. DVT Prophylaxis/Anticoagulation: Pharmaceutical: Lovenox  3. Pain Management: Will add Voltaren gel qid. Tylenol prn for pain  4. Mood: No signs of distress. LCSW to follow for evaluation and support.  5. Neuropsych: This patient is capable of making decisions on his own behalf.  6. Anemia of chronic disease: Will continue iron supplement. Monitor for signs of bleeding.  7. Acute renal failure: Improved after IVF and d/c of HCTZ/Lisinopril. Recheck in am.  8. Seizure disorder: will continue Keppra 1500 mg bid  9. COPD: stable on spiriva  10. H/o alcohol abuse: on folic acid and thiamine. Denies recent use, discussed ETOH lowers  seizure potential 11. Hypokalemia: will give a dose of K dur today and recheck in am.  12. HTN: Will monitor every 8 hours. Blood pressures trending back up. Continue to hold HCTZ. Will resume lisinopril at lower dose and monitor renal status.  13. Constipation: has not had a BM in a week. Schedule Senna S at bedtime and enema today.    LOS (Days) 1 A FACE TO FACE EVALUATION WAS PERFORMED  Byron Peacock,Shalik E 01/24/2014, 8:47 AM

## 2014-01-24 NOTE — Evaluation (Addendum)
Physical Therapy Assessment and Plan  Patient Details  Name: [name] MRN: [id number] Date of Birth: November 12, 1950  PT Diagnosis: Abnormality of gait, Hemiparesis dominant, Hypotonia, Impaired sensation, Muscle weakness and Pain in joint Rehab Potential: Good ELOS: 7-10   Today's Date: 01/24/2014 Time: 0920-1020, 60 min     Problem List:  Patient Active Problem List   Diagnosis Date Noted  . HTN (hypertension) 12/13/2013  . Anemia, iron deficiency 12/13/2013  . Anemia, chronic disease 12/12/2013  . Protein-calorie malnutrition, severe 12/04/2013  . Seizures 12/02/2013  . Acute-on-chronic kidney injury 12/02/2013  . Hypokalemia 12/01/2013  . Left-sided weakness, residual s/p cva 11/24/2013  . CVA (cerebral vascular accident) 11/23/2013  . Acute ischemic stroke 11/02/2013  . Generalized weakness 10/24/2013  . Aftercare following surgery of teeth, oral cavity or digestive system 10/23/2013  . Routine health maintenance 10/10/2013  . Prostate cancer 06/19/2013  . Localized swelling, mass, or lump of lower extremity 04/21/2013  . Hypertensive emergency 12/06/2012  . Stroke, acute, embolic 06/06/2535  . Malignant hypertension 12/05/2012  . Cerebral embolism with cerebral infarction 12/05/2012  . Dyslipidemia 12/05/2012  . Alcohol abuse 12/05/2012  . Claudication, intermittent 11/07/2012  . Smokes tobacco daily 10/18/2012  . History of stroke 10/14/2010  . Elevated PSA 05/25/2007  . Hypertension 05/18/2007  . COPD 05/18/2007    Past Medical History:  Past Medical History  Diagnosis Date  . Hypertension 05/18/2007  . COPD 05/18/2007  . Elevated PSA 05/25/2007  . Prostate cancer   . Seizures 12/05/2013; 12/08/2013; 12/11/2013    new onset; recurrent/notes 12/12/2013  . 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 Stroke 12/05/2012  . Cerebral embolism with cerebral infarction     residual left sided weakness/notes 12/12/2013  . Protein-calorie malnutrition, severe      [name] 12/12/2013  . Chronic kidney disease (CKD), stage II (mild)     [name] 12/12/2013   Past Surgical History:  Past Surgical History  Procedure Laterality Date  . 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 Multiple extractions with alveoloplasty N/A 10/23/2013    Procedure: Extraction of tooth #'s 1,2,3,4,5,6,7,11,12,13,14,15,16,20,21,22,23,24,27,28,29,32 with alveoloplasty and bilateral maxillary tuberosity reductions;  Surgeon: [name], DDS;  Location: [facility];  Service: Oral Surgery;  Laterality: N/A;  . Tibia fracture surgery Right     pin  . Tonsillectomy      Assessment & Plan Clinical Impression:[name] is a 63 y.o. 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 He was d/c five days ago and was awaiting mail order Rx of antiseizure medications and developed seizures X 2 on 01/21/14. He was admitted for workup due to seizures X 2 and FTT. He was hypotensive in ED and was treated with IVF bolus for acute renal failure with BUN/Cr- 39/2.81. Anemia of chronic disease with drop in Hgb to 6.5 yesterday and he was transfused with 2 units PRBC . Patient with villous adenoma excision 10/2013 and has been using NSAIDs for left knee pain. Patient transferred to CIR on 01/23/2014 .   Patient currently requires total +2 assist with mobility secondary to muscle weakness and muscle joint tightness, decreased cardiorespiratoy endurance and impaired timing and sequencing and abnormal tone.  Prior to hospitalization, patient was modified independent  for short distance household ambulation, prior to recent seizures. He  recently left [facility] due to dissatisfaction, and moved in with his cousin   in a 2 story   home.  Pt has bed and bath on entry level.  Home access is 4 steps, 2 rails, wide apart, which he has never  negotiated.   .  Patient will benefit from skilled PT intervention to maximize safe functional mobility, minimize fall risk and decrease caregiver burden for planned discharge home with intermittent assist.  Anticipate patient will benefit from follow up [facility] at discharge.  PT - End of Session Activity Tolerance: Tolerates < 10 min activity, no significant change in vital signs Endurance Deficit: Yes PT Assessment Rehab Potential: Good Barriers to Discharge: Decreased caregiver support Barriers to Discharge Comments: lack of follow up PT per acute PT PT Patient demonstrates impairments in the following area(s): Balance;Endurance;Motor;Safety;Sensory PT Transfers Functional Problem(s): Bed Mobility;Bed to Chair;Car;Furniture PT Locomotion Functional Problem(s): Ambulation;Wheelchair Mobility;Stairs PT Plan PT Intensity: Minimum of 1-2 x/day ,45 to 90 minutes PT Frequency: 5 out of 7 days PT Duration Estimated Length of Stay: 7-10 PT Treatment/Interventions: Ambulation/gait training;Balance/vestibular training;Discharge planning;Community reintegration;DME/adaptive equipment instruction;Functional mobility training;Patient/family education;Functional electrical stimulation;Neuromuscular re-education;Psychosocial support;Splinting/orthotics;Therapeutic Exercise;Therapeutic Activities;Stair training;UE/LE Strength taining/ROM;UE/LE Coordination activities;Wheelchair propulsion/positioning PT Transfers Anticipated Outcome(s): S except car with min assist PT Locomotion Anticipated Outcome(s): S w/c x 150'; min gait x 25'; min up/down 3 steps PT Recommendation Follow Up Recommendations: Home health PT;Outpatient PT Patient destination: Home (cousin's house) Equipment Details: owns w/c; / RW  Skilled Therapeutic Intervention- tx today: neuromuscular re-education via demo, manual cues, VCs, forced use for trunk/pelvic mobility in sitting to promote anterior tilt and L trunk activation for trunk  righting; stretching L heel cord  PT Evaluation Precautions/Restrictions Precautions Precautions: Fall Precaution Comments: L knee chronic pain Restrictions Weight Bearing Restrictions: No General   Vital SignsTherapy Vitals BP: 143/80 mmHg Oxygen Therapy SpO2: 97 % O2 Device: None (Room air) Pain Pain Assessment Pain Assessment: No/denies pain Home Living/Prior Functioning Home Living Additional Comments: Living with his cousin, [name], who works; will have a caregiver with him from 11am-2pm during the day per his cousin Prior Function Level of Independence: Needs assistance with ADLs  Able to Take Stairs?: No Driving: No Vocation: Full time employment (layed carpet for 34 years, on knees) Leisure: Hobbies-yes (Comment) (TV only per pt) Vision/Perception - needs reading glasses; does not feel his vision has changed recently    Cognition Overall Cognitive Status: Within Functional Limits for tasks assessed Orientation Level: Oriented X4 Sensation Sensation Light Touch: Impaired Detail (diminished R lower leg and foot; able to localize) Light Touch Impaired Details: Impaired LLE Proprioception: Impaired Detail Proprioception Impaired Details: Absent LLE Coordination Heel Shin Test: impaired speed, accuracy, excursion LLE Motor  Motor Motor: Hemiplegia;Motor impersistence;Abnormal tone  Mobility Bed Mobility Bed Mobility: Not assessed Transfers Transfers: Yes Squat Pivot Transfers: 3: Mod assist;2: Max assist (mod assist to R, max assist to L) Squat Pivot Transfer Details: Manual facilitation for weight shifting;Verbal cues for technique Squat Pivot Transfer Details (indicate cue type and reason): poor elevation of hips; extends hips and  back Locomotion  Ambulation Ambulation: Yes Ambulation/Gait Assistance: 1: +2 Total assist Ambulation Distance (Feet): 10 Feet Assistive device: None Ambulation/Gait Assistance Details: Verbal cues for technique;Manual  facilitation for weight bearing;Manual facilitation for placement;Manual facilitation for weight shifting Gait Gait: Yes Gait Pattern: Impaired Gait Pattern: Step-to pattern;Decreased step length - left;Decreased stance time - left;Decreased step length - right;Decreased hip/knee flexion - left;Decreased dorsiflexion - left;Decreased weight shift to left;Left foot flat;Narrow  base of support;Trunk flexed Gait velocity: significantly decreased Stairs / Additional Locomotion Stairs: Yes Stairs Assistance: 1: +2 Total assist Stairs Assistance Details: Manual facilitation for placement;Manual facilitation for weight bearing;Verbal cues for technique Stairs Assistance Details (indicate cue type and reason): +2 when descending backwards  Stair Management Technique: Two rails Number of Stairs: 3 Height of Stairs: 5 Wheelchair Mobility Wheelchair Mobility: Yes Wheelchair Assistance: 4: Min assist;3: Mod assist (for steering) Wheelchair Assistance Details: Verbal cues for Information systems manager: Both upper extremities Wheelchair Parts Management: Needs assistance Distance: 30  Trunk/Postural Assessment  Cervical Assessment Cervical Assessment: Within Functional Limits Thoracic Assessment Thoracic Assessment: Within Functional Limits Lumbar Assessment Lumbar Assessment: Exceptions to [facility] Lumbar AROM Overall Lumbar AROM Comments: limited rotation/ tilt of pelvis Postural Control Postural Control: Deficits on evaluation Righting Reactions: limited L trunk shortening/lengthening for trunk righting  Balance Balance Balance Assessed: Yes Static Sitting Balance Static Sitting - Level of Assistance: 6: Modified independent (Device/Increase time) Dynamic Sitting Balance Sitting balance - Comments: LOB when reaching down and L; unable to balance with any L trunk lean Static Standing Balance Static Standing - Level of Assistance: 2: Max assist Extremity Assessment      RLE  Assessment RLE Assessment: Within Functional Limits LLE Assessment LLE Assessment: Exceptions to [facility] (large cyst distal to knee joint; tight heel cord) LLE Strength LLE Overall Strength Comments: grossly in sitting, hip flexion 4-/5 ; knee ext 3-/5 but able to take minimal resistance; ankle DF 3-/5  FIM:  FIM - Locomotion: Wheelchair Distance: 30 FIM - Locomotion: Ambulation Ambulation/Gait Assistance: 1: +2 Total assist   Refer to Care Plan for Long Term Goals  Recommendations for other services: None  Discharge Criteria: Patient will be discharged from PT if patient refuses treatment 3 consecutive times without medical reason, if treatment goals not met, if there is a change in medical status, if patient makes no progress towards goals or if patient is discharged from hospital.  The above assessment, treatment plan, treatment alternatives and goals were discussed and mutually agreed upon: by patient  COOK,CAROLINE 01/24/2014, 10:36 AM

## 2014-01-24 NOTE — Evaluation (Signed)
Occupational Therapy Assessment and Plan  Patient Details  Name: [name] MRN: [id number] Date of Birth: [birth year]  OT Diagnosis: hemiplegia affecting non-dominant side and muscle weakness (generalized) Rehab Potential: Rehab Potential: Good ELOS: 7-10 days    Today's Date: 01/24/2014 Time: 5035-4656 Time Calculation: 60 min  Problem List:  Patient Active Problem List   Diagnosis Date Noted  . HTN (hypertension) 12/13/2013  . Anemia, iron deficiency 12/13/2013  . Anemia, chronic disease 12/12/2013  . Protein-calorie malnutrition, severe 12/04/2013  . Seizures 12/02/2013  . Acute-on-chronic kidney injury 12/02/2013  . Hypokalemia 12/01/2013  . Left-sided weakness, residual s/p cva 11/24/2013  . CVA (cerebral vascular accident) 11/23/2013  . Acute ischemic stroke 11/02/2013  . Generalized weakness 10/24/2013  . Aftercare following surgery of teeth, oral cavity or digestive system 10/23/2013  . Routine health maintenance 10/10/2013  . Prostate cancer 06/19/2013  . Localized swelling, mass, or lump of lower extremity 04/21/2013  . Hypertensive emergency 12/06/2012  . Stroke, acute, embolic 81/27/5170  . Malignant hypertension 12/05/2012  . Cerebral embolism with cerebral infarction 12/05/2012  . Dyslipidemia 12/05/2012  . Alcohol abuse 12/05/2012  . Claudication, intermittent 11/07/2012  . Smokes tobacco daily 10/18/2012  . History of stroke 10/14/2010  . Elevated PSA 05/25/2007  . Hypertension 05/18/2007  . COPD 05/18/2007    Past Medical History:  Past Medical History  Diagnosis Date  . Hypertension 05/18/2007  . COPD 05/18/2007  . Elevated PSA 05/25/2007  . Prostate cancer   . Seizures 12/05/2013; 12/08/2013; 12/11/2013    new onset; recurrent/notes 12/12/2013  . 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 Stroke 12/05/2012  . Cerebral embolism with cerebral infarction     residual left sided weakness/notes 12/12/2013  . Protein-calorie malnutrition, severe     [name] 12/12/2013  . Chronic kidney disease (CKD), stage II (mild)     [name] 12/12/2013   Past Surgical History:  Past Surgical History  Procedure Laterality Date  . 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 Multiple extractions with alveoloplasty N/A 10/23/2013    Procedure: Extraction of tooth #'s 1,2,3,4,5,6,7,11,12,13,14,15,16,20,21,22,23,24,27,28,29,32 with alveoloplasty and bilateral maxillary tuberosity reductions;  Surgeon: Lenn Cal, DDS;  Location: [place];  Service: Oral Surgery;  Laterality: N/A;  . Tibia fracture surgery Right     pin  . Tonsillectomy      Assessment & Plan Clinical Impression: Patient is a 63 y.o. 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 He was d/c five days ago and was awaiting mail order Rx of antiseizure medications and developed seizures X 2 on 01/21/14. He was admitted for workup due to seizures X 2 and FTT. He was hypotensive in ED and was treated with IVF bolus for acute renal failure with BUN/Cr- 39/2.81. Anemia of chronic disease with drop in Hgb to 6.5 yesterday and he was transfused with 2 units PRBC . Patient with villous adenoma excision 10/2013 and has been using NSAIDs for left knee pain. No reports of hematochezia. Neurology recommended continuing Keppra at current dose and no formal consult needed. PT evaluation done past admission and CIR recommended due to balance deficits and left knee instability. Patient's cousin works and he's alone during the day. He feels that he has declined in the past month and he would like intensive therapies to return to  prior level of activity. Patient transferred to CIR on 01/23/2014 .    Patient currently requires mod-total with basic self-care skills secondary to muscle weakness, decreased cardiorespiratoy endurance, decreased  coordination and decreased standing balance, decreased postural control, hemiplegia and decreased balance strategies.  Prior to hospitalization, patient's cousin assisted with BADLs and transfers secondary to weakness as result of recent seizures. Unclear of exact level of assistance pt required prior to admission.   Patient will benefit from skilled intervention to increase independence with basic self-care skills prior to discharge home with cousin and care attendant from 11-2 .  Anticipate patient will require 24 hour supervision and minimal physical assistance and follow up home health.  OT - End of Session Activity Tolerance: Tolerates 10 - 20 min activity with multiple rests Endurance Deficit: Yes OT Assessment Rehab Potential: Good Barriers to Discharge: Inaccessible home environment;Decreased caregiver support Barriers to Discharge Comments: unable to get w/c through bathroom door OT Patient demonstrates impairments in the following area(s): Balance;Cognition;Endurance;Motor;Sensory;Safety OT Basic ADL's Functional Problem(s): Grooming;Bathing;Dressing;Toileting OT Transfers Functional Problem(s): Toilet;Tub/Shower OT Additional Impairment(s): Fuctional Use of Upper Extremity OT Plan OT Intensity: Minimum of 1-2 x/day, 45 to 90 minutes OT Frequency: 5 out of 7 days OT Duration/Estimated Length of Stay: 7-10 days  OT Treatment/Interventions: Balance/vestibular training;Cognitive remediation/compensation;Discharge planning;DME/adaptive equipment instruction;Functional mobility training;Pain management;Neuromuscular re-education;Patient/family education;Self Care/advanced ADL retraining;Therapeutic Activities;UE/LE Strength taining/ROM;UE/LE Coordination activities;Visual/perceptual remediation/compensation;Therapeutic Exercise;Disease mangement/prevention;Wheelchair propulsion/positioning OT Basic Self-Care Anticipated Outcome(s): supervision-min assist  OT Toileting Anticipated  Outcome(s): supervision-min assist  OT Bathroom Transfers Anticipated Outcome(s): supervision for toileting and min assist for shower transfer OT Recommendation Patient destination: Home Follow Up Recommendations: Home health OT;24 hour supervision/assistance Equipment Details: pt reports he has 3-in-1 and shower seat   Skilled Therapeutic Intervention OT eval completed for ADL retraining of bathing and dressing with focus on functional transfers, activity tolerance, dynamic sitting and standing balance, and functional use of LUE. Pt received supine in bed and oriented x3. Completed supine>sit with mod cues and min assist as pt requesting assistance with LLE. Pt with good sitting balance at EOB however required max cues and min assist to bring left hip forward to place both feet on floor. Transferred to w/c with max assist and mod cues for hand placement. Placed in front of mirror for bathing at sink level with pt requiring min cues and max assist for sit>stand and dynamic standing balance to perform peri hygiene. Pt able to thread right leg through brief however required max assist to manipulate over hips in standing and pt difficulty releasing LUE grasp on brief. Pt with no personal clothing however able to thread bilateral UE through gown. Pt left sitting w/c with all needs in reach and MD present.   OT Evaluation Precautions/Restrictions  Precautions Precautions: Fall Precaution Comments: L knee chronic pain Restrictions Weight Bearing Restrictions: No    Pain Pain Assessment Pain Assessment: No/denies pain Home Living/Prior Functioning Home Living Family/patient expects to be discharged to:: Private residence Living Arrangements: Other relatives (live with his cousing) Available Help at Discharge: Family;Personal care attendant (attendant from 11-2) Type of Home: House Home Access: Stairs to enter Entrance Stairs-Number of Steps: 4 Home Layout: Two level Additional Comments:  Living with his cousin, Bernadette, who works; will have a caregiver with him from 11am-2pm during the day per his cousin  Lives With: Family (home with cousin ) IADL History Homemaking Responsibilities: No Occupation: Retired Prior Function Level of Independence: Needs assistance with ADLs  Able to Take Stairs?: No Driving: No   Vocation: Retired Leisure: Hobbies-yes (Comment) (watching tv) ADL  Refer to FIM levels below for details.   Vision/Perception  Vision- History Baseline Vision/History: No visual deficits;Wears glasses Wears Glasses: Reading only Vision- Assessment Vision Assessment?: Yes Eye Alignment: Within Functional Limits Tracking/Visual Pursuits: Able to track stimulus in all quads without difficulty Visual Fields: No apparent deficits  Cognition Overall Cognitive Status: Within Functional Limits for tasks assessed Orientation Level: Oriented X4 Attention: Sustained Sustained Attention: Impaired Sustained Attention Impairment: Verbal basic Memory: Appears intact Awareness: Appears intact Safety/Judgment: Impaired (attempting to pull on sink for sit>stand) Sensation Sensation Light Touch: Impaired Detail Light Touch Impaired Details: Impaired LUE Proprioception: Impaired Detail Proprioception Impaired Details: Absent LLE Coordination Gross Motor Movements are Fluid and Coordinated: Yes (during ADL) Fine Motor Movements are Fluid and Coordinated: Yes (during ADL) Heel Shin Test: impaired speed, accuracy, excursion LLE Extremity/Trunk Assessment RUE Assessment RUE Assessment: Within Functional Limits LUE Assessment LUE Assessment: Exceptions to [facility] (difficulty with release of grasp )  FIM:  FIM - Grooming Grooming Steps: Wash, rinse, dry face;Wash, rinse, dry hands Grooming: 3: Patient completes 2 of 4 or 3 of 5 steps FIM - Bathing Bathing Steps Patient Completed: Chest;Right Arm;Left Arm;Abdomen;Buttocks;Front perineal area;Right upper leg;Left upper  leg;Right lower leg (including foot) Bathing: 4: Min-Patient completes 8-9 12f10 parts or 75+ percent FIM - Upper Body Dressing/Undressing Upper body dressing/undressing: 0: Wears gown/pajamas-no public clothing FIM - Lower Body Dressing/Undressing Lower body dressing/undressing steps patient completed: Thread/unthread right underwear leg Lower body dressing/undressing: 1: Total-Patient completed less than 25% of tasks FIM - BControl and instrumentation engineerDevices: Arm rests;HOB elevated;Bed rails Bed/Chair Transfer: 4: Supine > Sit: Min A (steadying Pt. > 75%/lift 1 leg);2: Bed > Chair or W/C: Max A (lift and lower assist)   Refer to Care Plan for Long Term Goals  Recommendations for other services: None  Discharge Criteria: Patient will be discharged from OT if patient refuses treatment 3 consecutive times without medical reason, if treatment goals not met, if there is a change in medical status, if patient makes no progress towards goals or if patient is discharged from hospital.  The above assessment, treatment plan, treatment alternatives and goals were discussed and mutually agreed upon: by patient  SMaryan Char6/17/2015, 1:05 PM

## 2014-01-24 NOTE — Progress Notes (Signed)
Physical Therapy Session Note  Patient Details  Name: [name] MRN: [id number] Date of Birth: June 17, 1951  Today's Date: 01/24/2014 Time: 1330-1400 Time Calculation (min): 30 min  Short Term Goals: Week 1:  PT Short Term Goal 1 (Week 1): = LTGs due to LOS  Skilled Therapeutic Interventions/Progress Updates:    Patient received supine in bed. Session focused on functional transfers, wheelchair mobility, and core strength/trunk mobility. Supine>sit with minA, minA scoot/lateral transfer bed>wheelchair<>mat. Wheelchair propulsion with B UE x120' and supervision, cues for sequencing. Sitting EOM, trunk rotations with 4# weighted ball, progressed to raised mat without feet supported, then diagonals to each side with 2#. Emphasis on upright posture, scapular retraction, chest elevation. Patient returned to room and left sitting in wheelchair with seatbelt donned and all needs within reach.  Therapy Documentation Precautions:  Precautions Precautions: Fall Precaution Comments: L knee chronic pain Restrictions Weight Bearing Restrictions: No Pain: Pain Assessment Pain Assessment: No/denies pain Pain Score: 0-No pain Locomotion : Ambulation Ambulation/Gait Assistance: Not tested (comment) Wheelchair Mobility Distance: 120   See FIM for current functional status  Therapy/Group: Individual Therapy  Lillia Abed. Ripa, PT, DPT 01/24/2014, 4:24 PM

## 2014-01-24 NOTE — Evaluation (Signed)
This note has been reviewed and this clinician agrees with information provided.

## 2014-01-24 NOTE — Progress Notes (Signed)
Physical Therapy Session Note  Patient Details  Name: [name] MRN: [id number] Date of Birth: Mar 01, 1951  Today's Date: 01/24/2014 Time: 6979-4801 Time Calculation (min): 43 min  Short Term Goals: Week 1:  PT Short Term Goal 1 (Week 1): = LTGs due to LOS  Skilled Therapeutic Interventions/Progress Updates:  Pt denies pain, seated in [facility] when therapist arrived.  Session focused on WC mobility bil legs and arms supervision level ~120', transfer training WC <-> mat table utilizing different techniques scoot, squat, stand with and without RW level surface transfer to both right and left sides ranging from min to max assist depending on technique and direction.  WC education for parts management. Stair training bil rails step to pattern max assist to support trunk 4" steps up forwards, down backwards.  Gait training in hallway with RW max assist to go~15' with RW to support trunk, block and progress/place left leg and weight shift pelvis during stepping.    See FIM for current functional status  Therapy/Group: Individual Therapy  Wells Guiles B. [place], [place], DPT [phone]   01/24/2014, 1:52 PM

## 2014-01-24 NOTE — Patient Care Conference (Signed)
Inpatient RehabilitationTeam Conference and Plan of Care Update Date: 01/24/2014   Time: 11;15 AM    Patient Name: [name]      Medical Record Number: [id number]  Date of Birth: 1951-06-05 Sex: Male         Room/Bed: 4W18C/4W18C-01 Payor Info: Payor: MEDICAID [place] / Plan: MEDICAID [place] ACCESS / Product Type: *No Product type* /    Admitting Diagnosis: DECONDITIONED  Admit Date/Time:  01/23/2014  5:54 PM Admission Comments: No comment available   Primary Diagnosis:  <principal problem not specified> Principal Problem: <principal problem not specified>  Patient Active Problem List   Diagnosis Date Noted  . HTN (hypertension) 12/13/2013  . Anemia, iron deficiency 12/13/2013  . Anemia, chronic disease 12/12/2013  . Protein-calorie malnutrition, severe 12/04/2013  . Seizures 12/02/2013  . Acute-on-chronic kidney injury 12/02/2013  . Hypokalemia 12/01/2013  . Left-sided weakness, residual s/p cva 11/24/2013  . CVA (cerebral vascular accident) 11/23/2013  . Acute ischemic stroke 11/02/2013  . Generalized weakness 10/24/2013  . Aftercare following surgery of teeth, oral cavity or digestive system 10/23/2013  . Routine health maintenance 10/10/2013  . Prostate cancer 06/19/2013  . Localized swelling, mass, or lump of lower extremity 04/21/2013  . Hypertensive emergency 12/06/2012  . Stroke, acute, embolic 99/24/2683  . Malignant hypertension 12/05/2012  . Cerebral embolism with cerebral infarction 12/05/2012  . Dyslipidemia 12/05/2012  . Alcohol abuse 12/05/2012  . Claudication, intermittent 11/07/2012  . Smokes tobacco daily 10/18/2012  . History of stroke 10/14/2010  . Elevated PSA 05/25/2007  . Hypertension 05/18/2007  . COPD 05/18/2007    Expected Discharge Date: Expected Discharge Date: 02/02/14  Team Members Present: Physician leading conference: Dr. Alysia Penna Social Worker Present: Ovidio Kin, LCSW Nurse Present: Elliot Cousin, RN PT Present:  Georjean Mode, PT;Blair Hobble, PT OT Present: Antony Salmon, OT SLP Present: Other (comment);Gunnar Fusi, SLP Elmyra Ricks Page-SP) PPS Coordinator present : Ileana Ladd, Lelan Pons, RN, [facility]     Current Status/Progress Goal Weekly Team Focus  Medical   deconditioned after seizures and GI bleed  maintain med stability  therapy evals   Bowel/Bladder   Continent of bowel and bladder, LBM 01/23/14  Remain continent of bowel and bladder  Assess q shift   Swallow/Nutrition/ Hydration     wfl        ADL's     eval pending        Mobility   transfers variable- min> max assist; min w/c x 150', max gait with grocery cart 50', with RW 15'  supervision basic tr, min assist car; supervision w/c x 150', min assist gait x 25' and up/down 5 steps  neuro re-ed, transfers, w/c propulsion using hemi method, pre-gait and gait training, pt ed, AFO eval   Communication     wfl        Safety/Cognition/ Behavioral Observations  no unsafe behavior  min assist  continue to monitor q shift   Pain   No c/o pain  Pain less than or equal to 3  Assess and treat for pain q shift   Skin   no skin breakdwn, skin extremely dry in need of Eucerine cream to bilateral lower extremities  No new skin breakdown  Assess skin q shift    Rehab Goals Patient on target to meet rehab goals: Yes *See Care Plan and progress notes for long and short-term goals.  Barriers to Discharge: acute on chronic debility, limited family support    Possible Resolutions to Barriers:  cont  rehab, ?Sup at  d/c    Discharge Planning/Teaching Needs:    Home to cousin's home with 3 hour caregiver and cousin there in the evenings.  He used walker and wheelchair prior to admission.     Team Discussion:  Goals-supervision/min level. AFO consult requested. Continue to evaluate in therapies  Revisions to Treatment Plan:  New Eval   Continued Need for Acute Rehabilitation Level of Care: The patient requires daily medical management by a  physician with specialized training in physical medicine and rehabilitation for the following conditions: Daily direction of a multidisciplinary physical rehabilitation program to ensure safe treatment while eliciting the highest outcome that is of practical value to the patient.: Yes Daily medical management of patient stability for increased activity during participation in an intensive rehabilitation regime.: Yes Daily analysis of laboratory values and/or radiology reports with any subsequent need for medication adjustment of medical intervention for : Neurological problems  Elease Hashimoto 01/26/2014, 8:43 AM

## 2014-01-24 NOTE — Progress Notes (Signed)
Social Work Assessment and Plan Social Work Assessment and Plan  Patient Details  Name: [name] MRN: [id number] Date of Birth: 1951/07/24  Today's Date: 01/24/2014  Problem List:  Patient Active Problem List   Diagnosis Date Noted  . HTN (hypertension) 12/13/2013  . Anemia, iron deficiency 12/13/2013  . Anemia, chronic disease 12/12/2013  . Protein-calorie malnutrition, severe 12/04/2013  . Seizures 12/02/2013  . Acute-on-chronic kidney injury 12/02/2013  . Hypokalemia 12/01/2013  . Left-sided weakness, residual s/p cva 11/24/2013  . CVA (cerebral vascular accident) 11/23/2013  . Acute ischemic stroke 11/02/2013  . Generalized weakness 10/24/2013  . Aftercare following surgery of teeth, oral cavity or digestive system 10/23/2013  . Routine health maintenance 10/10/2013  . Prostate cancer 06/19/2013  . Localized swelling, mass, or lump of lower extremity 04/21/2013  . Hypertensive emergency 12/06/2012  . Stroke, acute, embolic 32/20/2542  . Malignant hypertension 12/05/2012  . Cerebral embolism with cerebral infarction 12/05/2012  . Dyslipidemia 12/05/2012  . Alcohol abuse 12/05/2012  . Claudication, intermittent 11/07/2012  . Smokes tobacco daily 10/18/2012  . History of stroke 10/14/2010  . Elevated PSA 05/25/2007  . Hypertension 05/18/2007  . COPD 05/18/2007   Past Medical History:  Past Medical History  Diagnosis Date  . Hypertension 05/18/2007  . COPD 05/18/2007  . Elevated PSA 05/25/2007  . Prostate cancer   . Seizures 12/05/2013; 12/08/2013; 12/11/2013    new onset; recurrent/notes 12/12/2013  . 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 Stroke 12/05/2012  . Cerebral embolism with cerebral infarction     residual left sided weakness/notes 12/12/2013  . Protein-calorie malnutrition, severe     [name] 12/12/2013  . Chronic kidney disease (CKD), stage II (mild)     [name] 12/12/2013   Past Surgical History:  Past Surgical History  Procedure Laterality Date   . 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 Multiple extractions with alveoloplasty N/A 10/23/2013    Procedure: Extraction of tooth #'s 1,2,3,4,5,6,7,11,12,13,14,15,16,20,21,22,23,24,27,28,29,32 with alveoloplasty and bilateral maxillary tuberosity reductions;  Surgeon: Lenn Cal, DDS;  Location: [facility];  Service: Oral Surgery;  Laterality: N/A;  . Tibia fracture surgery Right     pin  . Tonsillectomy     Social History:  reports that he has been smoking Cigarettes.  He has a 20 pack-year smoking history. He has never used smokeless tobacco. He reports that he drinks about 1.2 ounces of alcohol per week. He reports that he does not use illicit drugs.  Family / Support Systems Marital Status: Single Patient Roles: Other (Comment) (Cousin) Other Supports: Bernadette-cousin  [phone]   Crystal Graves-cousin  [phone] Anticipated Caregiver: [name] and hired caregiver 11-2pm Ability/Limitations of Caregiver: [name] works daytime, but plans to hire a caregiver 11-2pm Caregiver Availability: Evenings only Family Dynamics: Close with cousins and has been living with [place] and feels he is getting good care, was at [employer] home and has also been in a NH's also.  He has regained his strength and mobility prior to coming in to hospital this time.  Social History Preferred language: English Religion: Baptist Cultural Background: No issues Education: [employer] Read: Yes Write: Yes Employment Status: Disabled Freight forwarder Issues: No issues Guardian/Conservator: None-according to MD pt is capable of making his own decisions while here.   Abuse/Neglect Physical Abuse: Denies Verbal Abuse: Denies Sexual Abuse: Denies Exploitation of patient/patient's resources: Denies Self-Neglect: Denies  Emotional Status Pt's affect, behavior adn adjustment status: Pt is motivated to improve and get  back to his prior level fo funcitoning.  He was ambulating  and used a wheelchair at times. He wants to be mod/i before going home and will not have someone with him 24 hr at discharge. He has always been independent and will do his best to achieve these goals this time. Recent Psychosocial Issues: other medical issues-recent CVA's and residual deficits from them Pyschiatric History: No history deferred depression screen due to felt it was not necessary at this time.  He feels he is doing ok and the type of person who goes with the flow and looks forward instead of backwards.  Will monitor him while here and intervene if necessary Substance Abuse History: History of ETOH-reports he has quit. He is aware of the resources available to him and will follow up if he feels he needs this  Patient / Family Perceptions, Expectations & Goals Pt/Family understanding of illness & functional limitations: Pt is able to explain his stroke and deficits along with his seizure.  He feels he can get back to the level where he was and plans to try.  He doesn't want to burden his family and/or return back to a NH.  He is familiar with rehab has been here before and is glad he is here. Premorbid pt/family roles/activities: Cousin, Friend, Uncle, etc Anticipated changes in roles/activities/participation: resume Pt/family expectations/goals: Pt states: " I want to get back to the level I was and take care of myself."    [employer]: Other (Comment) (Had in the past) Premorbid Home Care/DME Agencies: Other (Comment) (had in past) Transportation available at discharge: Family Resource referrals recommended: Support group (specify) (CVA SUpport group)  Discharge Planning Living Arrangements: Other relatives Support Systems: Other relatives Type of Residence: Private residence Insurance Resources: Medicaid (specify county) ([place]) Museum/gallery curator Resources: SSI Financial Screen Referred: Previously completed Living Expenses: Lives with family Money  Management: Patient Does the patient have any problems obtaining your medications?: No Home Management: Family Patient/Family Preliminary Plans: Return home with cousin who can be there at night, but plans to hire someone from 11-2pm to assist pt while cousin is working.  Pt is aware of the alternatives if this is not possible and has been to tow different NH's before for therapies. Social Work Anticipated Follow Up Needs: HH/OP;Support Group  Clinical Impression Pt is well know to this worker since he was here before on rehab.  He is motivated and wants to do well here.  His family are trying to come up with caregivers for pt. Will pursue PCS services for pt and see if can get 3-4 hours of care for him with his Medicaid.  Will work on a safe discharge plan.  Pt has had multiple health issues and Residual deficits from his previous strokes.  Elease Hashimoto 01/24/2014, 3:18 PM

## 2014-01-24 NOTE — Care Management Note (Signed)
Inpatient [facility] Individual Statement of Services  Patient Name:  [name]  Date:  01/24/2014  Welcome to the [facility].  Our goal is to provide you with an individualized program based on your diagnosis and situation, designed to meet your specific needs.  With this comprehensive rehabilitation program, you will be expected to participate in at least 3 hours of rehabilitation therapies Monday-Friday, with modified therapy programming on the weekends.  Your rehabilitation program will include the following services:  Physical Therapy (PT), Occupational Therapy (OT), Speech Therapy (ST), 24 hour per day rehabilitation nursing, Case Management (Social Worker), Rehabilitation Medicine, Nutrition Services and Pharmacy Services  Weekly team conferences will be held on Wednesday to discuss your progress.  Your Social Worker will talk with you frequently to get your input and to update you on team discussions.  Team conferences with you and your family in attendance may also be held.  Expected length of stay: 7-10 days Overall anticipated outcome: supervision/min level  Depending on your progress and recovery, your program may change. Your Social Worker will coordinate services and will keep you informed of any changes. Your Social Worker's name and contact numbers are listed  below.  The following services may also be recommended but are not provided by the [facility]:   [place] will be made to provide these services after discharge if needed.  Arrangements include referral to agencies that provide these services.  Your insurance has been verified to be:  Medicaid Your primary doctor is:  Luanne Bras  Pertinent information will be shared with your doctor and your insurance company.  Social Worker:  Ovidio Kin, [place] or ([phone]  Information discussed with and copy given to patient by: Elease Hashimoto, 01/24/2014, 2:52 PM

## 2014-01-25 ENCOUNTER — Inpatient Hospital Stay (HOSPITAL_COMMUNITY): Payer: Medicaid Other | Admitting: Occupational Therapy

## 2014-01-25 ENCOUNTER — Inpatient Hospital Stay (HOSPITAL_COMMUNITY): Payer: Self-pay | Admitting: Physical Therapy

## 2014-01-25 ENCOUNTER — Inpatient Hospital Stay (HOSPITAL_COMMUNITY): Payer: Medicaid Other | Admitting: *Deleted

## 2014-01-25 ENCOUNTER — Inpatient Hospital Stay (HOSPITAL_COMMUNITY): Payer: Medicaid Other

## 2014-01-25 DIAGNOSIS — G8100 Flaccid hemiplegia affecting unspecified side: Secondary | ICD-10-CM

## 2014-01-25 DIAGNOSIS — J449 Chronic obstructive pulmonary disease, unspecified: Secondary | ICD-10-CM

## 2014-01-25 DIAGNOSIS — R569 Unspecified convulsions: Secondary | ICD-10-CM

## 2014-01-25 DIAGNOSIS — R5381 Other malaise: Secondary | ICD-10-CM

## 2014-01-25 LAB — URINE CULTURE: Colony Count: >=100000

## 2014-01-25 NOTE — Progress Notes (Signed)
63 y.o. 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 He was d/c five days ago and was awaiting mail order Rx of antiseizure medications and developed seizures X 2 on 01/21/14. He was admitted for workup due to seizures X 2 and FTT. He was hypotensive in ED and was treated with IVF bolus for acute renal failure with BUN/Cr- 39/2.81. Anemia acute on chronic drop, in Hgb to 6.5 was transfused with 2 units PRBC . Patient with villous adenoma excision 10/2013.  On NSAIDs at home for knee pain. No reports of hematochezia. Neurology recommended continuing Keppra at current dose and no formal consult needed  Subjective/Complaints: Poor appetite except breakfast  Review of Systems - Negative except left side weak and numb, poor appetite Objective: Vital Signs: Blood pressure 149/69, pulse 82, temperature 98.2 F (36.8 C), temperature source Oral, resp. rate 18, height 5' 8" (1.727 m), weight 74.345 kg (163 lb 14.4 oz), SpO2 98.00%. No results found. Results for orders placed during the hospital encounter of 01/23/14 (from the past 72 hour(s))  URINALYSIS, ROUTINE W REFLEX MICROSCOPIC     Status: None   Collection Time    01/23/14  6:44 PM      Result Value Ref Range   Color, Urine YELLOW  YELLOW   APPearance CLEAR  CLEAR   Specific Gravity, Urine 1.013  1.005 - 1.030   pH 7.0  5.0 - 8.0   Glucose, UA NEGATIVE  NEGATIVE mg/dL   Hgb urine dipstick NEGATIVE  NEGATIVE   Bilirubin Urine NEGATIVE  NEGATIVE   Ketones, ur NEGATIVE  NEGATIVE mg/dL   Protein, ur NEGATIVE  NEGATIVE mg/dL   Urobilinogen, UA 0.2  0.0 - 1.0 mg/dL   Nitrite NEGATIVE  NEGATIVE   Leukocytes, UA NEGATIVE  NEGATIVE   Comment: MICROSCOPIC NOT DONE ON URINES WITH NEGATIVE PROTEIN, BLOOD, LEUKOCYTES, NITRITE, OR GLUCOSE <1000 mg/dL.  CBC WITH DIFFERENTIAL      Status: Abnormal   Collection Time    01/24/14  6:03 AM      Result Value Ref Range   WBC 8.0  4.0 - 10.5 K/uL   RBC 3.39 (*) 4.22 - 5.81 MIL/uL   Hemoglobin 9.0 (*) 13.0 - 17.0 g/dL   HCT 28.3 (*) 39.0 - 52.0 %   MCV 83.5  78.0 - 100.0 fL   MCH 26.5  26.0 - 34.0 pg   MCHC 31.8  30.0 - 36.0 g/dL   RDW 16.7 (*) 11.5 - 15.5 %   Platelets 281  150 - 400 K/uL   Neutrophils Relative % 52  43 - 77 %   Neutro Abs 4.2  1.7 - 7.7 K/uL   Lymphocytes Relative 38  12 - 46 %   Lymphs Abs 3.1  0.7 - 4.0 K/uL   Monocytes Relative 8  3 - 12 %   Monocytes Absolute 0.7  0.1 - 1.0 K/uL   Eosinophils Relative 2  0 - 5 %   Eosinophils Absolute 0.1  0.0 - 0.7 K/uL   Basophils Relative 0  0 - 1 %   Basophils Absolute 0.0  0.0 - 0.1 K/uL  COMPREHENSIVE METABOLIC PANEL     Status: Abnormal   Collection Time    01/24/14  6:03 AM      Result Value Ref Range   Sodium 142  137 -  147 mEq/L   Potassium 4.1  3.7 - 5.3 mEq/L   Chloride 107  96 - 112 mEq/L   CO2 27  19 - 32 mEq/L   Glucose, Bld 91  70 - 99 mg/dL   BUN 10  6 - 23 mg/dL   Creatinine, Ser 0.94  0.50 - 1.35 mg/dL   Calcium 9.8  8.4 - 10.5 mg/dL   Total Protein 6.8  6.0 - 8.3 g/dL   Albumin 2.9 (*) 3.5 - 5.2 g/dL   AST 17  0 - 37 U/L   ALT 15  0 - 53 U/L   Alkaline Phosphatase 63  39 - 117 U/L   Total Bilirubin <0.2 (*) 0.3 - 1.2 mg/dL   GFR calc non Af Amer 86 (*) >90 mL/min   GFR calc Af Amer >90  >90 mL/min   Comment: (NOTE)     The eGFR has been calculated using the CKD EPI equation.     This calculation has not been validated in all clinical situations.     eGFR's persistently <90 mL/min signify possible Chronic Kidney     Disease.     HEENT: edentulous Cardio: RRR and no murmurs Resp: CTA B/L and unlabored GI: BS positive and NT,ND Extremity:  Pulses positive and No Edema Skin:   Intact and Other healed G tube site Neuro: Alert/Oriented, Cranial Nerve II-XII normal, Abnormal Sensory Reduced sensation to LT, LUE and LLE,  Abnormal Motor 5/5 in BUE, 5/5 RLE, 4/5 L HF, KE, 2- L ankle DF and PF, Abnormal FMC Ataxic/ dec FMC and Dysarthric Musc/Skel:  Normal Gen NAD   Assessment/Plan: 1. Functional deficits secondary to deconditioning after recurrent seizures and GI bleed requiring transfusion which require 3+ hours per day of interdisciplinary therapy in a comprehensive inpatient rehab setting. Physiatrist is providing close team supervision and 24 hour management of active medical problems listed below. Physiatrist and rehab team continue to assess barriers to discharge/monitor patient progress toward functional and medical goals. FIM: FIM - Bathing Bathing Steps Patient Completed: Chest;Right Arm;Left Arm;Abdomen;Buttocks;Front perineal area;Right upper leg;Left upper leg;Right lower leg (including foot) Bathing: 4: Min-Patient completes 8-9 48f10 parts or 75+ percent  FIM - Upper Body Dressing/Undressing Upper body dressing/undressing: 0: Wears gown/pajamas-no public clothing FIM - Lower Body Dressing/Undressing Lower body dressing/undressing steps patient completed: Thread/unthread right underwear leg Lower body dressing/undressing: 1: Total-Patient completed less than 25% of tasks        FIM - BControl and instrumentation engineerDevices: Arm rests;HOB elevated;Bed rails Bed/Chair Transfer: 4: Supine > Sit: Min A (steadying Pt. > 75%/lift 1 leg);4: Bed > Chair or W/C: Min A (steadying Pt. > 75%);4: Chair or W/C > Bed: Min A (steadying Pt. > 75%)  FIM - Locomotion: Wheelchair Distance: 120 Locomotion: Wheelchair: 2: Travels 50 - 149 ft with supervision, cueing or coaxing FIM - Locomotion: Ambulation Locomotion: Ambulation Assistive Devices: WAdministratorAmbulation/Gait Assistance: Not tested (comment) Locomotion: Ambulation: 0: Activity did not occur  Comprehension Comprehension Mode: Auditory Comprehension: 5-Follows basic conversation/direction: With no  assist  Expression Expression Mode: Verbal Expression: 4-Expresses basic 75 - 89% of the time/requires cueing 10 - 24% of the time. Needs helper to occlude trach/needs to repeat words.  Social Interaction Social Interaction: 7-Interacts appropriately with others - No medications needed.  Problem Solving Problem Solving: 5-Solves basic problems: With no assist  Memory Memory: 6-More than reasonable amt of time  Medical Problem List and Plan:  1. Functional deficits secondary to Recurrent  seizures, h/o CVA with left hemiparesis, deconditioning.  2. DVT Prophylaxis/Anticoagulation: Pharmaceutical: Lovenox  3. Pain Management: Will add Voltaren gel qid. Tylenol prn for pain  4. Mood: No signs of distress. LCSW to follow for evaluation and support.  5. Neuropsych: This patient is capable of making decisions on his own behalf.  6. Anemia of chronic disease: Will continue iron supplement. Monitor for signs of bleeding.  7. Acute renal failure: Improved after IVF and d/c of HCTZ/Lisinopril. Recheck in am.  8. Seizure disorder: will continue Keppra 1500 mg bid  9. COPD: stable on spiriva  10. H/o alcohol abuse: on folic acid and thiamine. Denies recent use, discussed ETOH lowers seizure potential 11. Hypokalemia: will give a dose of K dur today and recheck in am.  12. HTN: Will monitor every 8 hours. Blood pressures trending back up. Continue to hold HCTZ. Will resume lisinopril at lower dose and monitor renal status.  13. Constipation: has not had a BM in a week. Schedule Senna S at bedtime and enema today.  14.  Poor appetite, improving 75-100%  LOS (Days) 2 A FACE TO FACE EVALUATION WAS PERFORMED  [name],Devaun E 01/25/2014, 7:14 AM

## 2014-01-25 NOTE — Progress Notes (Signed)
Physical Therapy Session Note  Patient Details  Name: [name] MRN: [id number] Date of Birth: Sep 12, 1950  Today's Date: 01/25/2014 Time:1110-1205, 1510-1540 Time Calculation (min): 55, 30 min  Short Term Goals: Week 1:  PT Short Term Goal 1 (Week 1): = LTGs due to LOS  Skilled Therapeutic Interventions/Progress Updates:  Tx 1: No pain reported  W/c propulsion for L attention, use of L hand, using RLe, bil UEs , with min assist for steering, x 120'  Multiple squat pivot transfer w/c>< mat with poor elevation of hips due to L inattention, with min> max assist to R.  neuromuscular re-education via forced use, demo, manual cues, positioning wedges for L UE wt bearing, trunk activation, pelvic tilt and dissociation for trunk righting reactions, L wt bearing, balance reactions in sitting and gait  Gait with weighted grocery cart x 50', x 25' with max assist for L stance stability, L trunk elongation, max VCs for L step length  Tx 2: No c/o pain neuromuscular re-education via forced use, manual and VCs for L hip/trunk activation for bed mobility and gait.  After facilitation, pt able to isolate L hip flexion in supine, but with limited carry over into gait.  Gait with RW x 15' wearing athletic shoes, max assist for progression of LLE, trunk support.  Pt does not weight bear long enough on LLE to attempt to extend hip or knee.    Therapy Documentation Precautions:  Precautions Precautions: Fall Precaution Comments: L knee chronic pain Restrictions Weight Bearing Restrictions: No          See FIM for current functional status  Therapy/Group: Individual Therapy  COOK,CAROLINE 01/25/2014, 4:10 PM

## 2014-01-25 NOTE — Progress Notes (Signed)
Recreational Therapy Assessment and Plan  Patient Details  Name: [name] MRN: [id number] Date of Birth: [birth year] Today's Date: 01/25/2014  Rehab Potential: Good ELOS: 10 days   Assessment Clinical Impression:Problem List:  Patient Active Problem List    Diagnosis  Date Noted   .  HTN (hypertension)  12/13/2013   .  Anemia, iron deficiency  12/13/2013   .  Anemia, chronic disease  12/12/2013   .  Protein-calorie malnutrition, severe  12/04/2013   .  Seizures  12/02/2013   .  Acute-on-chronic kidney injury  12/02/2013   .  Hypokalemia  12/01/2013   .  Left-sided weakness, residual s/p cva  11/24/2013   .  CVA (cerebral vascular accident)  11/23/2013   .  Acute ischemic stroke  11/02/2013   .  Generalized weakness  10/24/2013   .  Aftercare following surgery of teeth, oral cavity or digestive system  10/23/2013   .  Routine health maintenance  10/10/2013   .  Prostate cancer  06/19/2013   .  Localized swelling, mass, or lump of lower extremity  04/21/2013   .  Hypertensive emergency  12/06/2012   .  Stroke, acute, embolic  88/89/1694   .  Malignant hypertension  12/05/2012   .  Cerebral embolism with cerebral infarction  12/05/2012   .  Dyslipidemia  12/05/2012   .  Alcohol abuse  12/05/2012   .  Claudication, intermittent  11/07/2012   .  Smokes tobacco daily  10/18/2012   .  History of stroke  10/14/2010   .  Elevated PSA  05/25/2007   .  Hypertension  05/18/2007   .  COPD  05/18/2007    Past Medical History:  Past Medical History   Diagnosis  Date   .  Hypertension  05/18/2007   .  COPD  05/18/2007   .  Elevated PSA  05/25/2007   .  Prostate cancer    .  Seizures  12/05/2013; 12/08/2013; 12/11/2013     new onset; recurrent/notes 12/12/2013   .  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  Stroke  12/05/2012   .  Cerebral embolism with cerebral infarction      residual left sided weakness/notes 12/12/2013   .  Protein-calorie malnutrition, severe      [name]  12/12/2013   .  Chronic kidney disease (CKD), stage II (mild)      [name] 12/12/2013    Past Surgical History:  Past Surgical History   Procedure  Laterality  Date   .  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  Multiple extractions with alveoloplasty  N/A  10/23/2013     Procedure: Extraction of tooth #'s 1,2,3,4,5,6,7,11,12,13,14,15,16,20,21,22,23,24,27,28,29,32 with alveoloplasty and bilateral maxillary tuberosity reductions; Surgeon: Lenn Cal, DDS; Location: [place]; Service: Oral Surgery; Laterality: N/A;   .  Tibia fracture surgery  Right      pin   .  Tonsillectomy      Assessment & Plan  Clinical Impression:[name] is a 63 y.o. 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 He was d/c five days ago and was awaiting mail order Rx of antiseizure medications and developed seizures X 2 on 01/21/14. He was admitted for workup due to seizures  X 2 and FTT. He was hypotensive in ED and was treated with IVF bolus for acute renal failure with BUN/Cr- 39/2.81. Anemia of chronic disease with drop in Hgb to 6.5 yesterday and he was transfused with 2 units PRBC . Patient with villous adenoma excision 10/2013 and has been using NSAIDs for left knee pain. Patient transferred to CIR on 01/23/2014 .   Pt presents with decreased activity tolerance, decreased functional mobility, decreased balance Limiting pt's independence with leisure/community pursuits.   Leisure History/Participation Premorbid leisure interest/current participation: Games - Cards;Games - Word-search;Sports - Basketball;Sports - Building services engineer Expression Interests: Music (Comment);Dance Other Leisure Interests: Television;Movies Leisure Participation Style: Alone Awareness of Community Resources: Good-identify 3 post discharge leisure  resources Psychosocial / Spiritual Patient agreeable to Pet Therapy: Yes Does patient have pets?: No Social interaction - Mood/Behavior: Cooperative Engineer, drilling for Education?: Yes Recreational Therapy Orientation Orientation -Reviewed with patient: Available activity resources Strengths/Weaknesses Patient Strengths/Abilities: Willingness to participate Patient weaknesses: Physical limitations TR Patient demonstrates impairments in the following area(s): Endurance;Motor;Safety  Plan Rec Therapy Plan Is patient appropriate for Therapeutic Recreation?: Yes Rehab Potential: Good Treatment times per week: Min 1 time per week >20 minutes Estimated Length of Stay: 10 days TR Treatment/Interventions: Adaptive equipment instruction;1:1 session;Balance/vestibular training;Functional mobility training;Community reintegration;Therapeutic activities;Patient/family education;Recreation/leisure participation;Therapeutic exercise;UE/LE Coordination activities;Wheelchair propulsion/positioning  Recommendations for other services: None  Discharge Criteria: Patient will be discharged from TR if patient refuses treatment 3 consecutive times without medical reason.  If treatment goals not met, if there is a change in medical status, if patient makes no progress towards goals or if patient is discharged from hospital.  The above assessment, treatment plan, treatment alternatives and goals were discussed and mutually agreed upon: by patient  [place] 01/25/2014, 10:27 AM

## 2014-01-25 NOTE — Progress Notes (Signed)
Occupational Therapy Session Note  Patient Details  Name: [name] MRN: [id number] Date of Birth: September 03, 1950  Today's Date: 01/25/2014 Time: 0800-0900 Time Calculation (min): 60 min  Short Term Goals: Week 1:  OT Short Term Goal 1 (Week 1): STG=LTG  Skilled Therapeutic Interventions/Progress Updates:  Patient in bed upon arrival eating his breakfast.  Engaged in self care retraining to include shower and don underware.  Patient reports that his cousin brought some clothes however only 1 pair of pants (jeans) are too small.  Patient wearing hosp gown today with underware instead of adult diaper.  Patient reports he will call the nursing station for assistance for all toileting needs and RN alerted as well.  Focused session on bed mobility, squat pivot transfer bed>w/c, stand step w/c>tub bench using grab bar and squat pivot tub bench>w/c.  Patient required min-mod assist with squat pivot and mod assist with stand step.  Patient performed bath seated then stand for bathing peri area and buttocks.  Patient demonstrated difficulty with apraxia with some of his mobility and following vcs and continues to demonstrate difficulty releasing items in his left hand approximately 50% of the time.  Patient will also begin to stand before letting this clinician know and his feet are not in safe position to take weight.  Instructed patient to always let staff know when he is ready to stand AND for him to wain until staff is ready for him to stand.  Hand off to PT.  Therapy Documentation Precautions:  Precautions Precautions: Fall Precaution Comments: L knee chronic pain Restrictions Weight Bearing Restrictions: No Pain: Pain Assessment Pain Assessment: No/denies pain ADL: See FIM for current functional status  Therapy/Group: Individual Therapy  Kaleeah Gingerich, [place] 01/25/2014, 12:18 PM

## 2014-01-25 NOTE — Progress Notes (Signed)
Physical Therapy Note  Patient Details  Name: PAETON STUDER MRN: [id number] Date of Birth: 1951/07/26 Today's Date: 01/25/2014  Time: 900-940 40 minutes  1:1 No c/o pain.  Treatment session focused on NMR, standing balance with wt shifts, sit to stands and functional transfers.  Pt requires manual facilitation and tactile cuing at hips for anterior lean and wt shifts for squat pivot transfers, min A to R, max A to L.  Sit to stand without UE support with manual facilitation to prevent hip rotation to L, pt able to correct with tactile cues.  Standing balance and wt shifts with focus on midline stance, correcting L rotation, tactile cues to elicit L quad contraction.  Pt with anxiety/fear with standing in neutral posture, requires encouragement.  Short distance gait 2 x 8' with mod/max A.  Pt shuffles feet due to fear/anxiety, resists wt shifts to R to advance L LE.  Attempted to reassure and encourage pt, pt still with anxiety with gait training.  Nustep for UE/LE strength and coordination x 5 minutes level 2.   DONAWERTH,KAREN 01/25/2014, 9:37 AM

## 2014-01-26 ENCOUNTER — Inpatient Hospital Stay (HOSPITAL_COMMUNITY): Payer: Self-pay | Admitting: Occupational Therapy

## 2014-01-26 ENCOUNTER — Encounter (HOSPITAL_COMMUNITY): Payer: Self-pay | Admitting: Occupational Therapy

## 2014-01-26 ENCOUNTER — Inpatient Hospital Stay (HOSPITAL_COMMUNITY): Payer: Medicaid Other

## 2014-01-26 ENCOUNTER — Inpatient Hospital Stay (HOSPITAL_COMMUNITY): Payer: Medicaid Other | Admitting: Physical Therapy

## 2014-01-26 DIAGNOSIS — G8100 Flaccid hemiplegia affecting unspecified side: Secondary | ICD-10-CM

## 2014-01-26 DIAGNOSIS — R5381 Other malaise: Secondary | ICD-10-CM

## 2014-01-26 DIAGNOSIS — J449 Chronic obstructive pulmonary disease, unspecified: Secondary | ICD-10-CM

## 2014-01-26 DIAGNOSIS — R569 Unspecified convulsions: Secondary | ICD-10-CM

## 2014-01-26 LAB — OCCULT BLOOD X 1 CARD TO LAB, STOOL: Fecal Occult Bld: POSITIVE — AB

## 2014-01-26 MED ORDER — FLEET ENEMA 7-19 GM/118ML RE ENEM
1.0000 | ENEMA | Freq: Once | RECTAL | Status: DC
  Filled 2014-01-26: qty 1

## 2014-01-26 MED ORDER — SENNOSIDES-DOCUSATE SODIUM 8.6-50 MG PO TABS
2.0000 | ORAL_TABLET | Freq: Two times a day (BID) | ORAL | Status: DC
  Administered 2014-01-26 – 2014-02-07 (×23): 2 via ORAL
  Filled 2014-01-26 (×23): qty 2

## 2014-01-26 NOTE — Progress Notes (Signed)
Physical Therapy Session Note  Patient Details  Name: [name] MRN: [id number] Date of Birth: 1951/03/13  Today's Date: 01/26/2014 Time: 9371-6967 Time Calculation (min): 60 min  Short Term Goals: Week 1:  PT Short Term Goal 1 (Week 1): = LTGs due to LOS  Skilled Therapeutic Interventions/Progress Updates:   Pt received sitting in w/c, agreeable to therapy. W/c mobility 2 x 150 ft in controlled environment using BUE and RLE, supervision.  Transfer training via squat pivot w/c <>level mat table, mod progressed to min A with vc's for sequencing, anterior weightshift, and head/hips relationship. Attempted gait x 20 ft using RW with max A x 1, manual facilitation of R weightshift for improved LLE clearance, max multimodal cues for LLE WB and facilitation of knee ext. Donned L ToeOff AFO for dorsiflexion assist for gait training using RW x 10 ft with max A, no improvement noted at this time for LLE clearance/advancement vs initial trial. NMR for sit <> stand using RW and static standing balance/posture with mirror for visual feedback, therapist facilitating weightshift R. Pt unable to maintain or achieve L knee extension without assist. Pt returned to room and left sitting in w/c with QRB on and all needs within reach.   Therapy Documentation Precautions:  Precautions Precautions: Fall Precaution Comments: L knee chronic pain Restrictions Weight Bearing Restrictions: No Pain:  Denied pain Locomotion : Ambulation Ambulation/Gait Assistance: 2: Max Financial controller Distance: 150   See FIM for current functional status  Therapy/Group: Individual Therapy  Laretta Alstrom 01/26/2014, 11:21 AM

## 2014-01-26 NOTE — Progress Notes (Signed)
Physical Therapy Session Note  Patient Details  Name: [name] MRN: [id number] Date of Birth: 03/21/1951  Today's Date: 01/26/2014 Time: 1130-1205 Time Calculation (min): 35 min  Short Term Goals: Week 1:  PT Short Term Goal 1 (Week 1): = LTGs due to LOS  Skilled Therapeutic Interventions/Progress Updates:   Pt. Seated in w/c with gait belt. Pt. Agreeable for therapy.  Therapeutic Activities: Transfer x 10  w/c <> bed, sit<>stand with tactile and verbal cues for sequencing, weight shifting, and for standing posture.  Therapeutic Exercise: Nustep lvl 1 for 10 min to increase coordination and proper BUE/BLE squencing. Pt. Displays good control of weak LLE with moderate activity/intenisty levels. Pt. Required frequent verbal cues to stay on task and maintain safe posture in sitting.  Pt. Seated in w/c with gait belt and needs within reach.   Therapy Documentation Precautions:  Precautions Precautions: Fall Precaution Comments: L knee chronic pain Restrictions Weight Bearing Restrictions: No General:   Vital Signs: Therapy Vitals BP: 161/83 mmHg Pain:   Mobility:   Locomotion : Ambulation Ambulation/Gait Assistance: 2: Max Financial controller Distance: 150  Trunk/Postural Assessment :    Balance:   Exercises:   Other Treatments:    See FIM for current functional status  Therapy/Group: Individual Therapy  Juluis Mire 01/26/2014, 2:56 PM

## 2014-01-26 NOTE — Progress Notes (Signed)
Occupational Therapy Session Note  Patient Details  Name: [name] MRN: [id number] Date of Birth: [birth year]  Today's Date: 01/26/2014 Time: 1130-1205 Time Calculation (min): 35 min  Short Term Goals: Week 1:  OT Short Term Goal 1 (Week 1): STG=LTG  Skilled Therapeutic Interventions/Progress Updates:    Pt was seen for individual OT treatment session with focus on squat pivot transfers, weight shifting, standing at table (in gym) & sink (in room) while WB through LUE, LLE, neuro re-ed., grasp/release left hand. Pt performed squat pivot transfer x2 from w/c to elevated toilet using grab bar and min assist, vc's for positioning of LLE prior to pivot. Pt performed WB & weight shifting/neuro re-ed standing at sink w/ therapist blocking left knee. Pt then propelled himself using bilateral UE's and frequent vc's for positioning of LUE & safety. In gym, pt participated in standing at table and focus on weight shifting & WB through left UE/LE, therapist blocked pt's left knee during this activity, pt required Min- Mod A w/ this. Pt also worked on grasp/release of rings w/ LUE in various planes in standing, again w/ Mod A to stand but supervision to release rings. Pt was assisted back to his room, QRB was on and all needs in reach.  Therapy Documentation Precautions:  Precautions Precautions: Fall Precaution Comments: L knee chronic pain Restrictions Weight Bearing Restrictions: No General:   Vital Signs: Therapy Vitals BP: 161/83 mmHg Pain:  Pt denies pain, no pain. ADL:       See FIM for current functional status  Therapy/Group: Individual Therapy  [name] 01/26/2014, 12:18 PM

## 2014-01-26 NOTE — IPOC Note (Signed)
Overall Plan of Care [facility]) Patient Details Name: [name] MRN: [id number] DOB: 02/25/1951  Admitting Diagnosis: DECONDITIONED  Hospital Problems: Active Problems:   Seizures     Functional Problem List: Nursing Pain;Bladder;Bowel;Edema;Sensory;Skin Integrity;Medication Management  PT Balance;Endurance;Motor;Safety;Sensory  OT Balance;Cognition;Endurance;Motor;Sensory;Safety  SLP    TR Endurance;Motor;Safety       Basic ADL's: OT Grooming;Bathing;Dressing;Toileting     Advanced  ADL's: OT       Transfers: PT Bed Mobility;Bed to Chair;Car;Furniture  OT Toilet;Tub/Shower     Locomotion: PT Ambulation;Wheelchair Mobility;Stairs     Additional Impairments: OT Fuctional Use of Upper Extremity  SLP        TR      Anticipated Outcomes Item Anticipated Outcome  Self Feeding    Swallowing      Basic self-care  supervision-min assist   Toileting  supervision-min assist    Bathroom Transfers supervision for toileting and min assist for shower transfer  Bowel/Bladder  mod I  Transfers  S except car with min assist  Locomotion  S w/c x 150'; min gait x 25'; min up/down 3 steps  Communication     Cognition     Pain  Less than or equal to 3  Safety/Judgment  min assist   Therapy Plan: PT Intensity: Minimum of 1-2 x/day ,45 to 90 minutes PT Frequency: 5 out of 7 days PT Duration Estimated Length of Stay: 7-10 OT Intensity: Minimum of 1-2 x/day, 45 to 90 minutes OT Frequency: 5 out of 7 days OT Duration/Estimated Length of Stay: 7-10 days          Team Interventions: Nursing Interventions Patient/Family Education;Pain Management;Bowel Management;Skin Care/Wound Management;Disease Management/Prevention;Medication Management;Bladder Management  PT interventions Ambulation/gait training;Balance/vestibular training;Discharge planning;Community reintegration;DME/adaptive equipment instruction;Functional mobility training;Patient/family education;Functional  electrical stimulation;Neuromuscular re-education;Psychosocial support;Splinting/orthotics;Therapeutic Exercise;Therapeutic Activities;Stair training;UE/LE Strength taining/ROM;UE/LE Coordination activities;Wheelchair propulsion/positioning  OT Interventions Balance/vestibular training;Cognitive remediation/compensation;Discharge planning;DME/adaptive equipment instruction;Functional mobility training;Pain management;Neuromuscular re-education;Patient/family education;Self Care/advanced ADL retraining;Therapeutic Activities;UE/LE Strength taining/ROM;UE/LE Coordination activities;Visual/perceptual remediation/compensation;Therapeutic Exercise;Disease mangement/prevention;Wheelchair propulsion/positioning  SLP Interventions    TR Interventions Adaptive equipment instruction;1:1 session;Balance/vestibular training;Functional mobility training;Community reintegration;Therapeutic activities;Patient/family education;Recreation/leisure participation;Therapeutic exercise;UE/LE Coordination activities;Wheelchair propulsion/positioning  SW/CM Interventions Discharge Planning;Psychosocial Support;Patient/Family Education    Team Discharge Planning: Destination: PT-Home (cousin's house) ,OT- Home , SLP-  Projected Follow-up: PT-Home health PT;Outpatient PT, OT-  Home health OT;24 hour supervision/assistance, SLP-  Projected Equipment Needs: PT- , OT-  , SLP-  Equipment Details: PT-owns w/c; / RW, OT-pt reports he has 3-in-1 and shower seat Patient/family involved in discharge planning: PT- Patient,  OT-Patient, SLP-   MD ELOS: 10-14d Medical Rehab Prognosis:  Good Assessment:   63 y.o. 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 He was d/c five days ago and was awaiting mail order Rx of antiseizure medications and developed  seizures X 2 on 01/21/14. He was admitted for workup due to seizures X 2 and FTT. He was hypotensive in ED and was treated with IVF bolus for acute renal failure with BUN/Cr- 39/2.81.  Now requiring 24/7 Rehab RN,MD, as well as CIR level PT, OT and SLP.  Treatment team will focus on ADLs and mobility with goals set at Sup/min A  See Team Conference Notes for weekly updates to the plan of care

## 2014-01-26 NOTE — Progress Notes (Signed)
Occupational Therapy Session Note  Patient Details  Name: [name] MRN: [id number] Date of Birth: [birth year]  Today's Date: 01/26/2014 Time: 0730-0829 Time Calculation (min): 59 min  Short Term Goals: Week 1:  OT Short Term Goal 1 (Week 1): STG=LTG  Skilled Therapeutic Interventions/Progress Updates:    Pt seen for individual OT treatment session for ADL retraining at shower level. Focused on bed mobility, sit to stand transfers, squat pivot transfers, weight shifting during self care tasks. Pt was overall Min-mod A squat pivot transfer from EOB to w/c and into shower using grab bar and tub bench. Pt requires vc's for safety and sequencing of tasks as he appears unaware of deficits at times (ie. Required Mod vc/tc's for placement of his left foot prior to transfers, & once attempted to stand using grab bar in shower while LLE was crossed over his right b/c he had been washing his left foot). Pt was Min-mod A shaving sitting at sink and grooming tasks. Pt was sitting in w/c w/ QRB on and eating breakfast, all needs in reach. Nurse tech was made aware that pt was alone in room eating after set up.  Therapy Documentation Precautions:  Precautions Precautions: Fall Precaution Comments: L knee chronic pain Restrictions Weight Bearing Restrictions: No General:   Vital Signs: Therapy Vitals Temp: 97.8 F (36.6 C) Temp src: Oral Pulse Rate: 73 Resp: 18 BP: 156/86 mmHg Patient Position (if appropriate): Lying Oxygen Therapy SpO2: 98 % O2 Device: None (Room air) Pain:  No, denies pain ADL:   See FIM for current functional status  Therapy/Group: Individual Therapy  Almyra Deforest 01/26/2014, 9:14 AM

## 2014-01-26 NOTE — Progress Notes (Signed)
63 y.o. 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 He was d/c five days ago and was awaiting mail order Rx of antiseizure medications and developed seizures X 2 on 01/21/14. He was admitted for workup due to seizures X 2 and FTT. He was hypotensive in ED and was treated with IVF bolus for acute renal failure with BUN/Cr- 39/2.81. Anemia acute on chronic drop, in Hgb to 6.5 was transfused with 2 units PRBC . Patient with villous adenoma excision 10/2013.  On NSAIDs at home for knee pain. No reports of hematochezia. Neurology recommended continuing Keppra at current dose and no formal consult needed  Subjective/Complaints: Remembers he had 4 therapies yesterday  Review of Systems - Negative except left side weak and numb, poor appetite Objective: Vital Signs: Blood pressure 156/86, pulse 73, temperature 97.8 F (36.6 C), temperature source Oral, resp. rate 18, height 5' 8"  (1.727 m), weight 74.345 kg (163 lb 14.4 oz), SpO2 98.00%. No results found. Results for orders placed during the hospital encounter of 01/23/14 (from the past 72 hour(s))  URINALYSIS, ROUTINE W REFLEX MICROSCOPIC     Status: None   Collection Time    01/23/14  6:44 PM      Result Value Ref Range   Color, Urine YELLOW  YELLOW   APPearance CLEAR  CLEAR   Specific Gravity, Urine 1.013  1.005 - 1.030   pH 7.0  5.0 - 8.0   Glucose, UA NEGATIVE  NEGATIVE mg/dL   Hgb urine dipstick NEGATIVE  NEGATIVE   Bilirubin Urine NEGATIVE  NEGATIVE   Ketones, ur NEGATIVE  NEGATIVE mg/dL   Protein, ur NEGATIVE  NEGATIVE mg/dL   Urobilinogen, UA 0.2  0.0 - 1.0 mg/dL   Nitrite NEGATIVE  NEGATIVE   Leukocytes, UA NEGATIVE  NEGATIVE   Comment: MICROSCOPIC NOT DONE ON URINES WITH NEGATIVE PROTEIN, BLOOD, LEUKOCYTES, NITRITE, OR GLUCOSE <1000 mg/dL.  URINE CULTURE      Status: None   Collection Time    01/24/14  4:20 AM      Result Value Ref Range   Specimen Description URINE, CLEAN CATCH     Special Requests NONE     Culture  Setup Time       Value: 01/24/2014 08:41     Performed at [facility]       Value: >=100,000 COLONIES/ML     Performed at [facility]   Culture       Value: Multiple bacterial morphotypes present, none predominant. Suggest appropriate recollection if clinically indicated.     Performed at [facility]   Report Status 01/25/2014 FINAL    CBC WITH DIFFERENTIAL     Status: Abnormal   Collection Time    01/24/14  6:03 AM      Result Value Ref Range   WBC 8.0  4.0 - 10.5 K/uL   RBC 3.39 (*) 4.22 - 5.81 MIL/uL   Hemoglobin 9.0 (*) 13.0 - 17.0 g/dL   HCT 28.3 (*) 39.0 - 52.0 %   MCV 83.5  78.0 - 100.0 fL   MCH 26.5  26.0 - 34.0 pg   MCHC 31.8  30.0 - 36.0 g/dL   RDW 16.7 (*) 11.5 - 15.5 %   Platelets 281  150 - 400 K/uL   Neutrophils Relative % 52  43 - 77 %   Neutro Abs 4.2  1.7 - 7.7 K/uL   Lymphocytes Relative 38  12 - 46 %   Lymphs Abs 3.1  0.7 - 4.0 K/uL   Monocytes Relative 8  3 - 12 %   Monocytes Absolute 0.7  0.1 - 1.0 K/uL   Eosinophils Relative 2  0 - 5 %   Eosinophils Absolute 0.1  0.0 - 0.7 K/uL   Basophils Relative 0  0 - 1 %   Basophils Absolute 0.0  0.0 - 0.1 K/uL  COMPREHENSIVE METABOLIC PANEL     Status: Abnormal   Collection Time    01/24/14  6:03 AM      Result Value Ref Range   Sodium 142  137 - 147 mEq/L   Potassium 4.1  3.7 - 5.3 mEq/L   Chloride 107  96 - 112 mEq/L   CO2 27  19 - 32 mEq/L   Glucose, Bld 91  70 - 99 mg/dL   BUN 10  6 - 23 mg/dL   Creatinine, Ser 0.94  0.50 - 1.35 mg/dL   Calcium 9.8  8.4 - 10.5 mg/dL   Total Protein 6.8  6.0 - 8.3 g/dL   Albumin 2.9 (*) 3.5 - 5.2 g/dL   AST 17  0 - 37 U/L   ALT 15  0 - 53 U/L   Alkaline Phosphatase 63  39 - 117 U/L   Total Bilirubin <0.2 (*) 0.3 - 1.2 mg/dL   GFR calc non Af Amer 86 (*) >90  mL/min   GFR calc Af Amer >90  >90 mL/min   Comment: (NOTE)     The eGFR has been calculated using the CKD EPI equation.     This calculation has not been validated in all clinical situations.     eGFR's persistently <90 mL/min signify possible Chronic Kidney     Disease.     HEENT: edentulous Cardio: RRR and no murmurs Resp: CTA B/L and unlabored GI: BS positive and NT,ND Extremity:  Pulses positive and No Edema Skin:   Intact and Other healed G tube site Neuro: Alert/Oriented, Cranial Nerve II-XII normal, Abnormal Sensory Reduced sensation to LT, LUE and LLE, Abnormal Motor 5/5 in BUE, 5/5 RLE, 4/5 L HF, KE, 2- L ankle DF and PF, Abnormal FMC Ataxic/ dec FMC and Dysarthric Musc/Skel:  Normal Gen NAD   Assessment/Plan: 1. Functional deficits secondary to deconditioning after recurrent seizures and GI bleed requiring transfusion which require 3+ hours per day of interdisciplinary therapy in a comprehensive inpatient rehab setting. Physiatrist is providing close team supervision and 24 hour management of active medical problems listed below. Physiatrist and rehab team continue to assess barriers to discharge/monitor patient progress toward functional and medical goals. FIM: FIM - Bathing Bathing Steps Patient Completed: Chest;Right Arm;Left Arm;Abdomen;Buttocks;Front perineal area;Right upper leg;Left upper leg;Right lower leg (including foot) Bathing: 4: Min-Patient completes 8-9 12f10 parts or 75+ percent  FIM - Upper Body Dressing/Undressing Upper body dressing/undressing: 0: Wears gown/pajamas-no public clothing FIM - Lower Body Dressing/Undressing Lower body dressing/undressing steps patient completed: Thread/unthread right underwear leg;Thread/unthread left underwear leg;Pull underwear up/down (no pants or shoes here yet) Lower body dressing/undressing: 3: Mod-Patient completed 50-74% of tasks  FIM - Toileting Toileting: 0: Activity did not occur     FIM - BFinancial traderDevices: Arm rests Bed/Chair Transfer: 4: Supine > Sit: Min A (steadying Pt. > 75%/lift 1 leg);3: Bed > Chair or W/C: Mod A (  lift or lower assist)  FIM - Locomotion: Wheelchair Distance: 120 Locomotion: Wheelchair: 2: Travels 50 - 149 ft with minimal assistance (Pt.>75%) FIM - Locomotion: Ambulation Locomotion: Ambulation Assistive Devices: Other (comment) (weighted grocery cart) Ambulation/Gait Assistance: 2: Max assist Locomotion: Ambulation: 2: Travels 50 - 149 ft with maximal assistance (Pt: 25 - 49%)  Comprehension Comprehension Mode: Auditory Comprehension: 5-Follows basic conversation/direction: With no assist  Expression Expression Mode: Verbal Expression: 4-Expresses basic 75 - 89% of the time/requires cueing 10 - 24% of the time. Needs helper to occlude trach/needs to repeat words.  Social Interaction Social Interaction: 7-Interacts appropriately with others - No medications needed.  Problem Solving Problem Solving: 5-Solves basic problems: With no assist  Memory Memory: 6-More than reasonable amt of time  Medical Problem List and Plan:  1. Functional deficits secondary to Recurrent seizures, h/o CVA with left hemiparesis, deconditioning.  2. DVT Prophylaxis/Anticoagulation: Pharmaceutical: Lovenox  3. Pain Management: Will add Voltaren gel qid. Tylenol prn for pain  4. Mood: No signs of distress. LCSW to follow for evaluation and support.  5. Neuropsych: This patient is capable of making decisions on his own behalf.  6. Anemia of chronic disease: Will continue iron supplement. Monitor for signs of bleeding.  7. Acute renal failure: Improved after IVF and d/c of HCTZ/Lisinopril. Recheck in am.  8. Seizure disorder: will continue Keppra 1500 mg bid  9. COPD: stable on spiriva  10. H/o alcohol abuse: on folic acid and thiamine. Denies recent use, pt now aware ETOH lowers seizure potential 11. Hypokalemia: will give a dose of K dur  today and recheck in am.  12. HTN: Will monitor every 8 hours. Blood pressures trending back up. Continue to hold HCTZ. Will resume lisinopril at lower dose and monitor renal status.  13. Constipation: last BM 6/16  increase Senna S at bedtime and enema today.    LOS (Days) 3 A FACE TO FACE EVALUATION WAS PERFORMED  [name],Doc E 01/26/2014, 6:59 AM

## 2014-01-27 DIAGNOSIS — G8100 Flaccid hemiplegia affecting unspecified side: Secondary | ICD-10-CM

## 2014-01-27 DIAGNOSIS — R5381 Other malaise: Secondary | ICD-10-CM

## 2014-01-27 DIAGNOSIS — J449 Chronic obstructive pulmonary disease, unspecified: Secondary | ICD-10-CM

## 2014-01-27 DIAGNOSIS — R569 Unspecified convulsions: Secondary | ICD-10-CM

## 2014-01-27 NOTE — Progress Notes (Signed)
63 y.o. 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 He was d/c five days ago and was awaiting mail order Rx of antiseizure medications and developed seizures X 2 on 01/21/14. He was admitted for workup due to seizures X 2 and FTT. He was hypotensive in ED and was treated with IVF bolus for acute renal failure with BUN/Cr- 39/2.81. Anemia acute on chronic drop, in Hgb to 6.5 was transfused with 2 units PRBC . Patient with villous adenoma excision 10/2013.  On NSAIDs at home for knee pain. No reports of hematochezia. Neurology recommended continuing Keppra at current dose and no formal consult needed  Subjective/Complaints: Had a good day yesterday. Likes therapy. Good appetite  Review of Systems - Negative except left side weak and numb, poor appetite Objective: Vital Signs: Blood pressure 129/80, pulse 71, temperature 98.3 F (36.8 C), temperature source Oral, resp. rate 18, height 5\' 8"  (1.727 m), weight 74.345 kg (163 lb 14.4 oz), SpO2 98.00%. No results found. Results for orders placed during the hospital encounter of 01/23/14 (from the past 72 hour(s))  OCCULT BLOOD X 1 CARD TO LAB, STOOL     Status: Abnormal   Collection Time    01/26/14  3:00 PM      Result Value Ref Range   Fecal Occult Bld POSITIVE (*) NEGATIVE     HEENT: edentulous Cardio: RRR and no murmurs Resp: CTA B/L and unlabored GI: BS positive and NT,ND Extremity:  Pulses positive and No Edema Skin:   Intact and Other healed G tube site Neuro: Alert/Oriented, Cranial Nerve II-XII normal, Abnormal Sensory Reduced sensation to LT, LUE and LLE, Abnormal Motor 5/5 in BUE, 5/5 RLE, 4/5 L HF, KE, 2- L ankle DF and PF, Abnormal FMC Ataxic/ dec FMC and Dysarthric Musc/Skel:  Normal Gen NAD   Assessment/Plan: 1. Functional deficits secondary to  deconditioning after recurrent seizures and GI bleed requiring transfusion which require 3+ hours per day of interdisciplinary therapy in a comprehensive inpatient rehab setting. Physiatrist is providing close team supervision and 24 hour management of active medical problems listed below. Physiatrist and rehab team continue to assess barriers to discharge/monitor patient progress toward functional and medical goals. FIM: FIM - Bathing Bathing Steps Patient Completed: Chest;Right Arm;Left Arm;Abdomen;Buttocks;Front perineal area;Right upper leg;Left upper leg;Right lower leg (including foot) Bathing: 4: Min-Patient completes 8-9 63f 10 parts or 75+ percent  FIM - Upper Body Dressing/Undressing Upper body dressing/undressing steps patient completed: Thread/unthread left sleeve of front closure shirt/dress;Thread/unthread right sleeve of front closure shirt/dress Upper body dressing/undressing: 2: Max-Patient completed 25-49% of tasks FIM - Lower Body Dressing/Undressing Lower body dressing/undressing steps patient completed: Thread/unthread right underwear leg;Thread/unthread left underwear leg;Pull underwear up/down Lower body dressing/undressing: 3: Mod-Patient completed 50-74% of tasks  FIM - Toileting Toileting: 0: Activity did not occur  FIM - Radio producer Devices: Elevated toilet seat;Grab bars Toilet Transfers: 4-To toilet/BSC: Min A (steadying Pt. > 75%);3-From toilet/BSC: Mod A (lift or lower assist) (Squat pivot transfer from w/c using grab bars)  FIM - Bed/Chair Transfer Bed/Chair Transfer Assistive Devices: Arm rests Bed/Chair Transfer: 3: Chair or W/C > Bed: Mod A (lift or lower assist);3: Bed > Chair or W/C: Mod A (lift or lower assist)  FIM - Locomotion: Wheelchair Distance: 200 Locomotion: Wheelchair: 5: Travels 150 ft or more: maneuvers on rugs  and over door sills with supervision, cueing or coaxing FIM - Locomotion: Ambulation Locomotion:  Ambulation Assistive Devices: Walker - Rolling Ambulation/Gait Assistance: 2: Max assist Locomotion: Ambulation: 1: Travels less than 50 ft with maximal assistance (Pt: 25 - 49%)  Comprehension Comprehension Mode: Auditory Comprehension: 5-Follows basic conversation/direction: With no assist  Expression Expression Mode: Verbal Expression: 4-Expresses basic 75 - 89% of the time/requires cueing 10 - 24% of the time. Needs helper to occlude trach/needs to repeat words.  Social Interaction Social Interaction: 7-Interacts appropriately with others - No medications needed.  Problem Solving Problem Solving: 5-Solves basic problems: With no assist  Memory Memory: 6-More than reasonable amt of time  Medical Problem List and Plan:  1. Functional deficits secondary to Recurrent seizures, h/o CVA with left hemiparesis, deconditioning.  2. DVT Prophylaxis/Anticoagulation: Pharmaceutical: Lovenox  3. Pain Management: Will add Voltaren gel qid. Tylenol prn for pain  4. Mood: No signs of distress. LCSW to follow for evaluation and support.  5. Neuropsych: This patient is capable of making decisions on his own behalf.  6. Anemia of chronic disease: Will continue iron supplement. Monitor for signs of bleeding.  7. Acute renal failure: Improved after IVF and d/c of HCTZ/Lisinopril. Recheck in am.  8. Seizure disorder: will continue Keppra 1500 mg bid  9. COPD: stable on spiriva  10. H/o alcohol abuse: on folic acid and thiamine. Denies recent use, pt now aware ETOH lowers seizure potential 11. Hypokalemia: will give a dose of K dur today and recheck in am.  12. HTN: Will monitor every 8 hours. Blood pressures trending back up. Continue to hold HCTZ. resumed lisinopril at lower dose   -follow renal status 13. Constipation: last BM 6/16  increased Senna S at bedtime and enema yesterday---2bm's yesterday.    LOS (Days) 4 A FACE TO FACE EVALUATION WAS PERFORMED  SWARTZ,ZACHARY T 01/27/2014, 8:39 AM

## 2014-01-28 ENCOUNTER — Inpatient Hospital Stay (HOSPITAL_COMMUNITY): Payer: Medicaid Other | Admitting: *Deleted

## 2014-01-28 ENCOUNTER — Inpatient Hospital Stay (HOSPITAL_COMMUNITY): Payer: Medicaid Other | Admitting: Physical Therapy

## 2014-01-28 DIAGNOSIS — J449 Chronic obstructive pulmonary disease, unspecified: Secondary | ICD-10-CM

## 2014-01-28 DIAGNOSIS — R569 Unspecified convulsions: Secondary | ICD-10-CM

## 2014-01-28 DIAGNOSIS — R5381 Other malaise: Secondary | ICD-10-CM

## 2014-01-28 DIAGNOSIS — G8100 Flaccid hemiplegia affecting unspecified side: Secondary | ICD-10-CM

## 2014-01-28 NOTE — Progress Notes (Signed)
63 y.o. 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 He was d/c five days ago and was awaiting mail order Rx of antiseizure medications and developed seizures X 2 on 01/21/14. He was admitted for workup due to seizures X 2 and FTT. He was hypotensive in ED and was treated with IVF bolus for acute renal failure with BUN/Cr- 39/2.81. Anemia acute on chronic drop, in Hgb to 6.5 was transfused with 2 units PRBC . Patient with villous adenoma excision 10/2013.  On NSAIDs at home for knee pain. No reports of hematochezia. Neurology recommended continuing Keppra at current dose and no formal consult needed  Subjective/Complaints: No new problems. Slept well.   Review of Systems - Negative except left side weak and numb, poor appetite Objective: Vital Signs: Blood pressure 160/81, pulse 69, temperature 98 F (36.7 C), temperature source Oral, resp. rate 18, height 5\' 8"  (1.727 m), weight 74.345 kg (163 lb 14.4 oz), SpO2 100.00%. No results found. Results for orders placed during the hospital encounter of 01/23/14 (from the past 72 hour(s))  OCCULT BLOOD X 1 CARD TO LAB, STOOL     Status: Abnormal   Collection Time    01/26/14  3:00 PM      Result Value Ref Range   Fecal Occult Bld POSITIVE (*) NEGATIVE     HEENT: edentulous Cardio: RRR and no murmurs Resp: CTA B/L and unlabored GI: BS positive and NT,ND Extremity:  Pulses positive and No Edema Skin:   Intact and Other healed G tube site Neuro: Alert/Oriented, Cranial Nerve II-XII normal, Abnormal Sensory Reduced sensation to LT, LUE and LLE, Abnormal Motor 5/5 in BUE, 5/5 RLE, 4/5 L HF, KE, 2- L ankle DF and PF, Abnormal FMC Ataxic/ dec FMC and Dysarthric Musc/Skel:  Normal Gen NAD   Assessment/Plan: 1. Functional deficits secondary to deconditioning after recurrent  seizures and GI bleed requiring transfusion which require 3+ hours per day of interdisciplinary therapy in a comprehensive inpatient rehab setting. Physiatrist is providing close team supervision and 24 hour management of active medical problems listed below. Physiatrist and rehab team continue to assess barriers to discharge/monitor patient progress toward functional and medical goals. FIM: FIM - Bathing Bathing Steps Patient Completed: Chest;Right Arm;Left Arm;Abdomen;Buttocks;Front perineal area;Right upper leg;Left upper leg;Right lower leg (including foot) Bathing: 4: Min-Patient completes 8-9 25f 10 parts or 75+ percent  FIM - Upper Body Dressing/Undressing Upper body dressing/undressing steps patient completed: Thread/unthread left sleeve of front closure shirt/dress;Thread/unthread right sleeve of front closure shirt/dress Upper body dressing/undressing: 2: Max-Patient completed 25-49% of tasks FIM - Lower Body Dressing/Undressing Lower body dressing/undressing steps patient completed: Thread/unthread right underwear leg;Thread/unthread left underwear leg;Pull underwear up/down Lower body dressing/undressing: 3: Mod-Patient completed 50-74% of tasks  FIM - Toileting Toileting: 0: Activity did not occur  FIM - Radio producer Devices: Elevated toilet seat;Grab bars Toilet Transfers: 4-To toilet/BSC: Min A (steadying Pt. > 75%);3-From toilet/BSC: Mod A (lift or lower assist) (Squat pivot transfer from w/c using grab bars)  FIM - Bed/Chair Transfer Bed/Chair Transfer Assistive Devices: Arm rests Bed/Chair Transfer: 3: Chair or W/C > Bed: Mod A (lift or lower assist);3: Bed > Chair or W/C: Mod A (lift or lower assist)  FIM - Locomotion: Wheelchair Distance: 200 Locomotion: Wheelchair: 5: Travels 150 ft or more: maneuvers on rugs and over door  sills with supervision, cueing or coaxing FIM - Locomotion: Ambulation Locomotion: Ambulation Assistive Devices:  Walker - Rolling Ambulation/Gait Assistance: 2: Max assist Locomotion: Ambulation: 1: Travels less than 50 ft with maximal assistance (Pt: 25 - 49%)  Comprehension Comprehension Mode: Auditory Comprehension: 5-Follows basic conversation/direction: With no assist  Expression Expression Mode: Verbal Expression: 4-Expresses basic 75 - 89% of the time/requires cueing 10 - 24% of the time. Needs helper to occlude trach/needs to repeat words.  Social Interaction Social Interaction: 6-Interacts appropriately with others with medication or extra time (anti-anxiety, antidepressant).  Problem Solving Problem Solving: 5-Solves basic problems: With no assist  Memory Memory: 6-More than reasonable amt of time  Medical Problem List and Plan:  1. Functional deficits secondary to Recurrent seizures, h/o CVA with left hemiparesis, deconditioning.  2. DVT Prophylaxis/Anticoagulation: Pharmaceutical: Lovenox  3. Pain Management: Will add Voltaren gel qid. Tylenol prn for pain  4. Mood: No signs of distress. LCSW to follow for evaluation and support.  5. Neuropsych: This patient is capable of making decisions on his own behalf.  6. Anemia of chronic disease: Will continue iron supplement. Monitor for signs of bleeding.  7. Acute renal failure: Improved after IVF and d/c of HCTZ/Lisinopril. Recheck in am.  8. Seizure disorder: will continue Keppra 1500 mg bid  9. COPD: stable on spiriva  10. H/o alcohol abuse: on folic acid and thiamine. Denies recent use, pt now aware ETOH lowers seizure potential 11. Hypokalemia: repleted 12. HTN: Will monitor every 8 hours. Blood pressures trending back up. Continue to hold HCTZ. resumed lisinopril at lower dose yesterday--follow pattern  -recheck renal status later this week 13. Constipation: last BM 6/16  increased Senna S at bedtime and enema yesterday---2bm's yesterday.    LOS (Days) 5 A FACE TO FACE EVALUATION WAS PERFORMED  SWARTZ,ZACHARY T 01/28/2014,  8:25 AM

## 2014-01-28 NOTE — Progress Notes (Signed)
Occupational Therapy Session Note  Patient Details  Name: [name] MRN: [id number] Date of Birth: 1950/10/02  Today's Date: 01/28/2014 Time: 1000-1100 Time Calculation (min): 60 min  Short Term Goals: Week 1:  OT Short Term Goal 1 (Week 1): STG=LTG  Skilled Therapeutic Interventions/Progress Updates:      1st session:  Pt. Lying in bed upon OT arrival.  Addressed bed mobility, dynamic sitting balance, sit to stand, standing balance, transfers.  Pt. Was supervision with bed mobility and minimal assist with bed to wc transfer.  Pt. Elected to sponge bathe at sink rather than shower.  Went from sit to stand with cues to lock wc and get feet under knees.  Pt. Stood for peri care and pants for 30 seconds with minimal assist for dynamic balance when reaching. Went to gym and engaged in standing and weight shifting in parallel bars with emphasis on shift and holding on left UE/LE.  Pt. Needed manual facilitation and control for the weight.  Performed pushing strategies with LUE in shoulder flexion and elbow extension.  Left pt in wc with safety belt on and call bell,phone within reach.      Time:  1525-1625  (60 min) Pain:  none Individual session 2nd session:  Propelled wc to gym.  Engaged in [place].   Did AROM,AAROM, PNF, lateral and forward reaching with horseshoes, basketball and catch.  Pt has good ROM but needed minimal assist to control and isotonic holding.  Practiced  Holding basket ball with LUE to throw with one hand.  Attempted prone on elbows but unable.  Did half squats with hands on bench and pt heavy weight bearing on LUE.  Did exercises in supine with pt having difficulty bringing Left arm from sho. Flexion to neutral.  Felt there was something with tone or block from arm freely moving.  The patient returned to room and was left with devices in reach.        Therapy Documentation Precautions:  Precautions Precautions: Fall Precaution Comments: L knee chronic  pain Restrictions Weight Bearing Restrictions: No General: General Missed Time Reason: Other (comment) (Pt eating breakfast) Vital Signs: Oxygen Therapy O2 Device: None (Room air) Pain:  none    ADL:   Exercises:   Other Treatments:    See FIM for current functional status  Therapy/Group: Individual Therapy  Lisa Roca 01/28/2014, 10:42 AM

## 2014-01-28 NOTE — Progress Notes (Signed)
Physical Therapy Session Note  Patient Details  Name: [name] MRN: [id number] Date of Birth: Jun 14, 1951  Today's Date: 01/28/2014 Time: 0810-0900 and [id number] Time Calculation (min): 50 min and 30 min  Short Term Goals: Week 1:  PT Short Term Goal 1 (Week 1): = LTGs due to LOS  Skilled Therapeutic Interventions/Progress Updates:   AM Session: Pt received sitting in bed, requesting to finish breakfast. Upon returning to room, pt transferred supine > sit HOB flat using bed rail, multiple attempts to sit straight up with LLE in air despite max vc's for sequencing. Win minA and continued vc's for sequencing, transferred to sitting EOB with min A. Pt performed squat pivot transfer bed > w/c with mod A. W/c mobility using BUEs x 150 ft with supervision. Transfer training via squat pivot transfer w/c <> mat to both R and L. Pt requires max vc's for safe setup, sequencing, and technique, with mod-minA to complete transfer. Stair negotiation up/down 3 stairs using 2 rails with mod A, vc's for sequencing and technique, and manual placement required for safe positioning of LLE on stair descending. Pt transferred to NuStep with focus on forced LLE extension, pt immediately requesting need for bathroom after transferring to NuStep. Returned to chair and room and performed squat pivot transfer to toilet with mod A using grab bar, pt unable to follow vc's for sequencing for safe transfer. Pt left sitting on toilet, verbalized understanding to call for help before getting up.   PM Session: Pt received sitting in w/c, ready for therapy. W/c propulsion using BUEs 2 x 150 ft with supervision. Squat pivot transfer min A with no cues for sequencing/technique w/c <>NuStep. Pt performed LLE NMR on NuStep Level 2  using BLEs for forced use, coordination, and motor control with emphasis on L knee extension x 10 min. Pt transferred to/from mat with min A to R, mod A to L. Performed bed mobility with min A and max vc's  for sequencing and more efficient technique x 2. Pt returned to room and left sitting in w/c with all needs within reach.   Therapy Documentation Precautions:  Precautions Precautions: Fall Precaution Comments: L knee chronic pain Restrictions Weight Bearing Restrictions: No General: Amount of Missed PT Time (min): 10 Minutes Missed Time Reason: Other (comment) (Pt eating breakfast) Vital Signs: Therapy Vitals Temp: 98 F (36.7 C) Temp src: Oral Pulse Rate: 69 Resp: 18 BP: 160/81 mmHg Patient Position (if appropriate): Lying Oxygen Therapy SpO2: 100 % O2 Device: None (Room air) Pain:  Denies pain See FIM for current functional status  Therapy/Group: Individual Therapy  Laretta Alstrom 01/28/2014, 9:03 AM

## 2014-01-29 ENCOUNTER — Inpatient Hospital Stay (HOSPITAL_COMMUNITY): Payer: Medicaid Other

## 2014-01-29 ENCOUNTER — Inpatient Hospital Stay (HOSPITAL_COMMUNITY): Payer: Medicaid Other | Admitting: Occupational Therapy

## 2014-01-29 ENCOUNTER — Ambulatory Visit (HOSPITAL_COMMUNITY): Payer: Self-pay

## 2014-01-29 ENCOUNTER — Inpatient Hospital Stay (HOSPITAL_COMMUNITY): Payer: Self-pay | Admitting: Physical Therapy

## 2014-01-29 DIAGNOSIS — G8100 Flaccid hemiplegia affecting unspecified side: Secondary | ICD-10-CM

## 2014-01-29 DIAGNOSIS — J449 Chronic obstructive pulmonary disease, unspecified: Secondary | ICD-10-CM

## 2014-01-29 DIAGNOSIS — R5381 Other malaise: Secondary | ICD-10-CM

## 2014-01-29 DIAGNOSIS — R569 Unspecified convulsions: Secondary | ICD-10-CM

## 2014-01-29 NOTE — Consult Note (Signed)
  INITIAL DIAGNOSTIC EVALUATION - CONFIDENTIAL  Inpatient Rehabilitation   MEDICAL NECESSITY:  [name] was seen on the [facility] for an initial diagnostic evaluation owing to the patient's diagnosis of stroke.   According to medical records, 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 He has suffered seizures and was recently in SNF due to recurrent seizures.   During today's visit, [name] was cooperative and compliant with the appointment. He denied suffering from any cognitive difficulties, even after any of his strokes. He has a hard time expressing himself because all of his teeth were removed. He thought this had something to do with prostate cancer (which he also suffers). He mentioned having trouble with left-sided weakness. No memory, attention, or visuospatial deficits endorsed.   From an emotional standpoint, [name] described his current mood as "alright." He denied ever being diagnosed with or treated for a mental health disorder. He said that he misses his "cigarettes and drinks." Upon query he meant alcohol. With further prompting, the patient said that he never drank more than two drinks a day and has no history of substance abuse. This has not been substantiated. He said that he now plans to stop drinking alcohol entirely.   In terms of therapy, [name] feels that he is making "fair" gains. He described the staff as "pretty good." He was living with his cousin prior to admission and plans to return to this living situation upon discharge. No major social stressors identified. His cousin is seemingly his main source of  social support.   PROCEDURES ADMINISTERED: [1 unit [id number] on 01/29/14] Diagnostic clinical interview  Review of available records  Emotional & Behavioral Evaluation: [name] was appropriately dressed for season and situation, and he appeared tidy and well-groomed. Normal posture was noted. He was friendly and rapport was easily established. His speech was stuttered but he was generally able to express ideas effectively. His affect blunted. Attention and motivation adequate.   From an emotional standpoint, [name] denied ever being diagnosed or treated for a mental health disorder. He denied current depression/anxiety. No adjustment issues identified or any barriers to therapy. Suicidal/homicidal ideation, plan or intent was denied. No manic or hypomanic episodes were reported. The patient denied ever experiencing any auditory/visual hallucinations. No major behavioral or personality changes were endorsed.    Overall, [name] denied suffering from any major cognitive, mood or behavioral symptoms. However, given the complexity of his history, it is very likely that he is experiencing a change in thinking to some degree. However, the main priority at this time is to focus on physical recovery and no barriers (cognitive or otherwise) are seemingly present that preclude that from happening. An outpatient neuropsychology referral would be beneficial to assess for cognitive strengths and challenges. No formal follow-up by neuropsychology will be scheduled as an inpatient. He was encouraged to call upon [place] as needed.    RECOMMENDATIONS    No formal follow-up scheduled. Patient will call upon [place] as needed.    Complete outpatient neuropsychological evaluation.    DIAGNOSES:  Cerebral infarction Seizures   Rutha Bouchard, Psy.D.  Clinical Neuropsychologist

## 2014-01-29 NOTE — Progress Notes (Signed)
Physical Therapy Session Note  Patient Details  Name: [name] MRN: [id number] Date of Birth: 08-26-1950  Today's Date: 01/29/2014 Time: 1021-1105 Time Calculation (min): 44 min  Short Term Goals: Week 1:  PT Short Term Goal 1 (Week 1): = LTGs due to LOS  Skilled Therapeutic Interventions/Progress Updates:   Pt received in w/c.  Pt performed w/c mobility to gym with bilat UE and LE for coordination, motor control and timing/sequencing x 150' with supervision and verbal cues for use of LLE.  In gym performed gait assessment with +2 HHA; pt only able to ambulate 5-6' secondary to fear of falling; pt reporting he cannot "feel" his LLE and is very fearful of placing weight on LLE.  Pt noted to lack weight shift to L, decreased stance time L, decreased step length RLE.  Performed NMR; see below.     Therapy Documentation Precautions:  Precautions Precautions: Fall Precaution Comments: L knee chronic pain Restrictions Weight Bearing Restrictions: No Vital Signs: Therapy Vitals BP: 139/80 mmHg Oxygen Therapy O2 Device: None (Room air) Pain: Pain Assessment Pain Assessment: No/denies pain Locomotion : Ambulation Ambulation/Gait Assistance: 1: +2 Total assist Wheelchair Mobility Distance: 150  Other Treatments: Treatments Neuromuscular Facilitation: Left;Lower Extremity;Forced use;Activity to increase motor control;Activity to increase timing and sequencing;Activity to increase sustained activation;Activity to increase lateral weight shifting;Activity to increase anterior-posterior weight shifting in // bars with R foot wedged to facilitate L lateral weight shifting during reaching out of BOS up, forwards and to the L and maintaining weight shift while lifting RLE in the air.  Repeated gait in // bars with pt demonstrating mildly improved L lateral weight shift, stance time and improved RLE step length during gait forwards and retro in // bars; still required extra time and cues for  initiation and sequencing of gait.  Also incorporated reaching forwards or sliding LUE down LLE to facilitated coordinated hip and knee flexion <> extension and maintaining COG over BOS during sit <> stand secondary to pt tendency to fall posterior.      See FIM for current functional status  Therapy/Group: Individual Therapy  Raylene Everts [facility] 01/29/2014, 12:55 PM

## 2014-01-29 NOTE — Progress Notes (Signed)
Occupational Therapy Session Note  Patient Details  Name: [name] MRN: [id number] Date of Birth: May 22, 1951  Today's Date: 01/29/2014 Time: 8016-5537 and 1415-1500 Time Calculation (min): 45 min and 45 min  Short Term Goals: Week 1:  OT Short Term Goal 1 (Week 1): STG=LTG      Skilled Therapeutic Interventions/Progress Updates:    Visit 1:  Pain:  No c/o pain. Tx: Pt seen for BADL retraining of B/D with a focus on safe transfers, safe sit to stand with proper foot placement, and standing balance. Pt was eager to get out of bed and get in the shower. Pt transferred out of bed with extra cuing and time needed for management of left leg. Pt did complete his transfers with min A but as he moved to his L his L leg twisted underneath him. Reviewed safe foot placement and then pt had much safer transfers on and off the tub bench. Pt has excellent use of LUE in bimanual tasks. Pt only needed steady A with his self care today along with cues for hemidressing techniques. Good activity tolerance. Pt resting in w/c at end of the session with call light in reach.  Visit 2: Pain: no c/o pain Treatment:  Pt seen this session for neuro re-ed for trunk control for sit >< stand and standing balance. Pt has great resistance with forward leaning for transfers, sit to stand and a posterior lean in standing. Pt worked on [employer] of w/c >< mat transfers with mod A to facilitate forward trunk lean and hip elevation. Pt then worked on dynamic reaching to left to force weight through LLE from an elevated mat and sit to stand holding a dowel bar to encourage forward trunk lean. Pt's muscles were very tight initially, but then loosened up with repeated movement. Pt also worked on forward lean into stand with pushing grocery cart forward and back.   Pt transferred back to w/c and returned to his room with call light in reach.  Therapy Documentation Precautions:  Precautions Precautions: Fall Precaution Comments: L  knee chronic pain Restrictions Weight Bearing Restrictions: No    Vital Signs: Therapy Vitals BP: 139/80 mmHg Oxygen Therapy O2 Device: None (Room air)    ADL:  See FIM for current functional status  Therapy/Group: Individual Therapy  SAGUIER,JULIA 01/29/2014, 12:26 PM

## 2014-01-29 NOTE — Progress Notes (Signed)
Physical Therapy Session Note  Patient Details  Name: [name] MRN: [id number] Date of Birth: 02/05/1951  Today's Date: 01/29/2014 Time: 1300-1400 Time Calculation (min): 60 min  Short Term Goals: Week 1:  PT Short Term Goal 1 (Week 1): = LTGs due to LOS Week 2:     Skilled Therapeutic Interventions/Progress Updates:  neuromuscular re-education via forced use, positioning, manual cues for LLE use during w/c propulsion, L forearm wt bearing, L stance stability during gait, L swing phase during step/taps LLE, repetitive sit<> stand biased to L; steps  Activities- prolonged L lateral lean, pelvic mobility in sitting with L trunk lengthening during L reaching, wt shifting to L during manipulation of cards on table in standing  Up/down 3 (5" ) steps bil rails, with mod assist.  On last step descending, pt unsafely chose to step down with R foot after attempting to descend with L foot but not placing it completely on floor.     Therapy Documentation Precautions:  Precautions Precautions: Fall Precaution Comments: L knee chronic pain Restrictions Weight Bearing Restrictions: No General:   Vital Signs:   Pain: Pain Assessment Pain Assessment: No/denies pain Mobility:   Locomotion : Ambulation Ambulation/Gait Assistance: 1: +2 Total assist Wheelchair Mobility Distance: 150  Trunk/Postural Assessment :    Balance:   Exercises:   Other Treatments: Treatments Neuromuscular Facilitation: Left;Lower Extremity;Forced use;Activity to increase motor control;Activity to increase timing and sequencing;Activity to increase sustained activation;Activity to increase lateral weight shifting;Activity to increase anterior-posterior weight shifting  See FIM for current functional status  Therapy/Group: Individual Therapy  COOK,CAROLINE 01/29/2014, 2:02 PM

## 2014-01-29 NOTE — Progress Notes (Signed)
63 y.o. 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 He was d/c five days ago and was awaiting mail order Rx of antiseizure medications and developed seizures X 2 on 01/21/14. He was admitted for workup due to seizures X 2 and FTT. He was hypotensive in ED and was treated with IVF bolus for acute renal failure with BUN/Cr- 39/2.81. Anemia acute on chronic drop, in Hgb to 6.5 was transfused with 2 units PRBC . Patient with villous adenoma excision 10/2013.  On NSAIDs at home for knee pain. No reports of hematochezia. Neurology recommended continuing Keppra at current dose and no formal consult needed  Subjective/Complaints: Feels like he isn't progressing well   Review of Systems - Negative except left side weak and numb, poor appetite Objective: Vital Signs: Blood pressure 145/78, pulse 80, temperature 98.3 F (36.8 C), temperature source Oral, resp. rate 18, height 5\' 8"  (1.727 m), weight 74.345 kg (163 lb 14.4 oz), SpO2 99.00%. No results found. Results for orders placed during the hospital encounter of 01/23/14 (from the past 72 hour(s))  OCCULT BLOOD X 1 CARD TO LAB, STOOL     Status: Abnormal   Collection Time    01/26/14  3:00 PM      Result Value Ref Range   Fecal Occult Bld POSITIVE (*) NEGATIVE     HEENT: edentulous Cardio: RRR and no murmurs Resp: CTA B/L and unlabored GI: BS positive and NT,ND Extremity:  Pulses positive and No Edema Skin:   Intact and Other healed G tube site Neuro: Alert/Oriented, Cranial Nerve II-XII normal, Abnormal Sensory Reduced sensation to LT, LUE and LLE, Abnormal Motor 5/5 in BUE, 5/5 RLE, 4/5 L HF, KE, 2- L ankle DF and PF, Abnormal FMC Ataxic/ dec FMC and Dysarthric Musc/Skel:  Normal Gen NAD   Assessment/Plan: 1. Functional deficits secondary to deconditioning after  recurrent seizures and GI bleed requiring transfusion which require 3+ hours per day of interdisciplinary therapy in a comprehensive inpatient rehab setting. Physiatrist is providing close team supervision and 24 hour management of active medical problems listed below. Physiatrist and rehab team continue to assess barriers to discharge/monitor patient progress toward functional and medical goals. FIM: FIM - Bathing Bathing Steps Patient Completed: Chest;Right Arm;Left Arm;Abdomen;Buttocks;Front perineal area;Right upper leg;Left upper leg Bathing: 4: Min-Patient completes 8-9 40f 10 parts or 75+ percent  FIM - Upper Body Dressing/Undressing Upper body dressing/undressing steps patient completed: Thread/unthread left sleeve of front closure shirt/dress;Thread/unthread right sleeve of front closure shirt/dress;Thread/unthread right sleeve of pullover shirt/dresss;Thread/unthread left sleeve of pullover shirt/dress;Put head through opening of pull over shirt/dress;Pull shirt over trunk Upper body dressing/undressing: 2: Max-Patient completed 25-49% of tasks FIM - Lower Body Dressing/Undressing Lower body dressing/undressing steps patient completed: Thread/unthread right underwear leg;Thread/unthread left underwear leg;Pull underwear up/down Lower body dressing/undressing: 3: Mod-Patient completed 50-74% of tasks  FIM - Toileting Toileting: 0: Activity did not occur  FIM - Radio producer Devices: Elevated toilet seat;Grab bars Toilet Transfers: 3-From toilet/BSC: Mod A (lift or lower assist)  FIM - Control and instrumentation engineer Devices: Arm rests;Bed rails Bed/Chair Transfer: 3: Chair or W/C > Bed: Mod A (lift or lower assist)  FIM - Locomotion: Wheelchair Distance: 150 Locomotion: Wheelchair: 5: Travels 150 ft or more: maneuvers on rugs and over door sills with supervision, cueing or coaxing FIM -  Locomotion: Ambulation Locomotion: Ambulation  Assistive Devices: Administrator Ambulation/Gait Assistance: 2: Max assist Locomotion: Ambulation: 0: Activity did not occur  Comprehension Comprehension Mode: Auditory Comprehension: 5-Follows basic conversation/direction: With no assist  Expression Expression Mode: Verbal Expression: 4-Expresses basic 75 - 89% of the time/requires cueing 10 - 24% of the time. Needs helper to occlude trach/needs to repeat words.  Social Interaction Social Interaction: 6-Interacts appropriately with others with medication or extra time (anti-anxiety, antidepressant).  Problem Solving Problem Solving: 5-Solves basic problems: With no assist  Memory Memory: 6-More than reasonable amt of time  Medical Problem List and Plan:  1. Functional deficits secondary to Recurrent seizures, h/o CVA with left hemiparesis, deconditioning.  2. DVT Prophylaxis/Anticoagulation: Pharmaceutical: Lovenox  3. Pain Management: Will add Voltaren gel qid. Tylenol prn for pain  4. Mood: No signs of distress. LCSW to follow for evaluation and support.  5. Neuropsych: This patient is capable of making decisions on his own behalf.  6. Anemia of chronic disease: Will continue iron supplement. Monitor for signs of bleeding.  7. Acute renal failure: Improved after IVF and d/c of HCTZ/Lisinopril. Recheck in am.  8. Seizure disorder: will continue Keppra 1500 mg bid  9. COPD: stable on spiriva  10. H/o alcohol abuse: on folic acid and thiamine. Denies recent use, pt now aware ETOH lowers seizure potential 11. Hypokalemia: repleted 12. HTN: Will monitor every 8 hours. Blood pressures trending back up. Continue to hold HCTZ. resumed lisinopril at lower dose yesterday--follow pattern  -recheck renal status later this week 13. Constipation: last BM 6/16  increased Senna S at bedtime and enema yesterday---2bm's yesterday.  14.  GI bleed recent, stool still OB + as expected, monitor Hgb  LOS (Days) 6 A FACE TO FACE EVALUATION WAS  PERFORMED  [name],[name] 01/29/2014, 7:33 AM

## 2014-01-29 NOTE — Progress Notes (Signed)
Social Work Patient ID: [name], male   DOB: Nov 16, 1950, 63 y.o.   MRN: [id number] Met wight pt and niece-Bernadette to discuss discharge needs and pt's level. She feels pt needs a hospital bed due to falling out of bed and for his transfers and  Bed mobility.  Will ask team and work on this, he has all of the other needed DME.  She reports pt will have 24 hr care when he leaves.  He does tend to do what He wants although it may not be good for him.  He plans to quit drinking and lessen his smoking.  He does miss smoking.  Will work on discharge plans.

## 2014-01-30 ENCOUNTER — Inpatient Hospital Stay (HOSPITAL_COMMUNITY): Payer: Medicaid Other | Admitting: *Deleted

## 2014-01-30 ENCOUNTER — Inpatient Hospital Stay (HOSPITAL_COMMUNITY): Payer: Medicaid Other | Admitting: Occupational Therapy

## 2014-01-30 DIAGNOSIS — J449 Chronic obstructive pulmonary disease, unspecified: Secondary | ICD-10-CM

## 2014-01-30 DIAGNOSIS — R569 Unspecified convulsions: Secondary | ICD-10-CM

## 2014-01-30 DIAGNOSIS — R5381 Other malaise: Secondary | ICD-10-CM

## 2014-01-30 DIAGNOSIS — G8100 Flaccid hemiplegia affecting unspecified side: Secondary | ICD-10-CM

## 2014-01-30 MED ORDER — PHENYLEPHRINE HCL 0.5 % NA SOLN
1.0000 [drp] | Freq: Four times a day (QID) | NASAL | Status: DC | PRN
  Filled 2014-01-30: qty 15

## 2014-01-30 NOTE — Progress Notes (Signed)
63 y.o. 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 He was d/c five days ago and was awaiting mail order Rx of antiseizure medications and developed seizures X 2 on 01/21/14. He was admitted for workup due to seizures X 2 and FTT. He was hypotensive in ED and was treated with IVF bolus for acute renal failure with BUN/Cr- 39/2.81. Anemia acute on chronic drop, in Hgb to 6.5 was transfused with 2 units PRBC . Patient with villous adenoma excision 10/2013.  On NSAIDs at home for knee pain. No reports of hematochezia. Neurology recommended continuing Keppra at current dose and no formal consult needed  Subjective/Complaints: Pt without new issues Appreciate Psych note  Review of Systems - Negative except left side weak and numb, poor appetite Objective: Vital Signs: Blood pressure 141/79, pulse 68, temperature 98 F (36.7 C), temperature source Oral, resp. rate 18, height 5\' 8"  (1.727 m), weight 74.345 kg (163 lb 14.4 oz), SpO2 95.00%. No results found. No results found for this or any previous visit (from the past 72 hour(s)).   HEENT: edentulous Cardio: RRR and no murmurs Resp: CTA B/L and unlabored GI: BS positive and NT,ND Extremity:  Pulses positive and No Edema Skin:   Intact and Other healed G tube site Neuro: Alert/Oriented, Cranial Nerve II-XII normal, Abnormal Sensory Reduced sensation to LT, LUE and LLE, Abnormal Motor 5/5 in BUE, 5/5 RLE, 4/5 L HF, KE, 2- L ankle DF and PF, Abnormal FMC Ataxic/ dec FMC and Dysarthric Musc/Skel:  Normal Gen NAD   Assessment/Plan: 1. Functional deficits secondary to deconditioning after recurrent seizures and GI bleed requiring transfusion which require 3+ hours per day of interdisciplinary therapy in a comprehensive inpatient rehab setting. Physiatrist is providing close  team supervision and 24 hour management of active medical problems listed below. Physiatrist and rehab team continue to assess barriers to discharge/monitor patient progress toward functional and medical goals. FIM: FIM - Bathing Bathing Steps Patient Completed: Chest;Right Arm;Left Arm;Abdomen;Buttocks;Front perineal area;Right upper leg;Left upper leg;Right lower leg (including foot);Left lower leg (including foot) Bathing: 4: Steadying assist  FIM - Upper Body Dressing/Undressing Upper body dressing/undressing steps patient completed: Thread/unthread right sleeve of pullover shirt/dresss;Thread/unthread left sleeve of pullover shirt/dress;Put head through opening of pull over shirt/dress;Pull shirt over trunk Upper body dressing/undressing: 5: Set-up assist to: Obtain clothing/put away FIM - Lower Body Dressing/Undressing Lower body dressing/undressing steps patient completed: Thread/unthread right underwear leg;Thread/unthread left underwear leg;Pull underwear up/down;Thread/unthread right pants leg;Thread/unthread left pants leg;Pull pants up/down;Don/Doff right sock;Don/Doff left sock;Don/Doff right shoe;Don/Doff left shoe;Fasten/unfasten right shoe;Fasten/unfasten left shoe Lower body dressing/undressing: 4: Steadying Assist  FIM - Toileting Toileting: 0: Activity did not occur  FIM - Radio producer Devices: Elevated toilet seat;Grab bars Toilet Transfers: 4-To toilet/BSC: Min A (steadying Pt. > 75%);4-From toilet/BSC: Min A (steadying Pt. > 75%)  FIM - Bed/Chair Transfer Bed/Chair Transfer Assistive Devices: Arm rests Bed/Chair Transfer: 3: Bed > Chair or W/C: Mod A (lift or lower assist);3: Chair or W/C > Bed: Mod A (lift or lower assist)  FIM - Locomotion: Wheelchair Distance: 150 Locomotion: Wheelchair: 6: Travels 150 ft or more, turns around, maneuvers to table, bed or toilet, negotiates 3% grade: maneuvers on rugs and over door sills  independently FIM - Locomotion: Ambulation Locomotion: Ambulation Assistive Devices: Parallel bars Ambulation/Gait Assistance: 1: +2 Total assist  Locomotion: Ambulation: 0: Activity did not occur  Comprehension Comprehension Mode: Auditory Comprehension: 5-Follows basic conversation/direction: With no assist  Expression Expression Mode: Verbal Expression: 4-Expresses basic 75 - 89% of the time/requires cueing 10 - 24% of the time. Needs helper to occlude trach/needs to repeat words.  Social Interaction Social Interaction: 6-Interacts appropriately with others with medication or extra time (anti-anxiety, antidepressant).  Problem Solving Problem Solving: 5-Solves basic problems: With no assist  Memory Memory: 6-More than reasonable amt of time  Medical Problem List and Plan:  1. Functional deficits secondary to Recurrent seizures, h/o CVA with left hemiparesis, deconditioning.  2. DVT Prophylaxis/Anticoagulation: Pharmaceutical: Lovenox  3. Pain Management: Will add Voltaren gel qid. Tylenol prn for pain  4. Mood: No signs of distress. LCSW to follow for evaluation and support.  5. Neuropsych: This patient is capable of making decisions on his own behalf.  6. Anemia of chronic disease: Will continue iron supplement. Monitor for signs of bleeding.  7. Acute renal failure: Improved after IVF and d/c of HCTZ/Lisinopril. Recheck in am.  8. Seizure disorder: will continue Keppra 1500 mg bid  9. COPD: stable on spiriva  10. H/o alcohol abuse: on folic acid and thiamine. Denies recent use, pt now aware ETOH lowers seizure potential 11. Hypokalemia: repleted 12. HTN: Will monitor every 8 hours. Blood pressures trending back up. Continue to hold HCTZ. resumed lisinopril at lower dose yesterday--follow pattern  -recheck renal status later this week 13. Constipation: last BM 6/16  increased Senna S at bedtime and enema yesterday---2bm's yesterday.  14.  GI bleed recent, stool still OB + as  expected, monitor Hgb  LOS (Days) 7 A FACE TO FACE EVALUATION WAS PERFORMED  [name],Gurshaan E 01/30/2014, 7:05 AM

## 2014-01-30 NOTE — Progress Notes (Signed)
Occupational Therapy Session Note  Patient Details  Name: [name] MRN: [id number] Date of Birth: [birth year]  Today's Date: 01/30/2014 Time: 1005-1100 Time Calculation: 55 min  Short Term Goals: Week 1:  OT Short Term Goal 1 (Week 1): STG=LTG  Skilled Therapeutic Interventions/Progress Updates:   Pt declining ADL retraining today secondary to pt already completed. Pt seen for neuromuscular reeducation with focus on dynamic standing balance, activity tolerance and postural control to increase independence with self care tasks. Pt received in w/c and propelled himself to therapy gym with extra time and min cues for turn changes. Utilized parallel bars for standing balance during NMR with pt requiring supervision-min assist for sit<>stand and mod cues for premature sit to w/c. Pt worked on weight shift in standing, RLE kicks x10 to front/back and side, toe tapping, LE coordination, symmetrical foot placement, squat hold ~5 sec, squat position>stand x10, and postural control while holding parallel bars requiring supervision-min assist and mod multimodal cues. Pt fatiguing throughout session requiring frequent rest breaks. Pt left sitting in w/c in the gym with PT present.     Therapy Documentation Precautions:  Precautions Precautions: Fall Precaution Comments: L knee chronic pain Restrictions Weight Bearing Restrictions: No  Pain:  Pt reporting no pain at this time.   ADL:   See FIM for current functional status  Therapy/Group: Individual Therapy  [name] Brooke 01/30/2014, 4:30 PM

## 2014-01-30 NOTE — Progress Notes (Addendum)
Physical Therapy Weekly Progress Note  Patient Details  Name: [name] MRN: [id number] Date of Birth: 1951/01/05  Beginning of progress report period: January 23, 2014 End of progress report period: January 30, 2014  Today's Date: 01/30/2014 Time:8:00-9:00 and 1105-1150 and 13:35 - 14:00  Time Calculation (min): 33mn and 45 min and 334m   Patient has met 2 of 12 long term goals.  Short term goals not set due to estimated length of stay.    Patient continues to demonstrate the following deficits: decreased motor control, UE and LE weakness, coordination, joint and muscle tightness and therefore will continue to benefit from skilled PT intervention to enhance overall performance with activity tolerance, balance, postural control, ability to compensate for deficits, functional use of  left lower extremity and coordination.  See Patient's Care Plan for progression toward long term goals.  Patient progressing toward long term goals.. Marland Kitchenut will benefit from an additional week of inpatient therapy to accomplish LTG's for increased independence, decreased caregiver safety, and falls risk reduction at home. OT is in agreement with this option for maximal independence. Continue plan of care.    Skilled Therapeutic Interventions/Progress Updates:  First tx focused on NMR via forced use, manual facilitation, and verbal cues; functional mobility, and gait with AFO trials and RW.  Pt in bed, self-directing cares for obtaining clothes and assist levels needed for transfers. Pt needing cues for safety and efficiency as well as assist to complete transfers.  Bed mobility with self-directed min A for LLE carrying.  Transfer training bed>WC<>mat<>WC<>toilet with with verbal and tacilte with cues; 50% trials with mod a to complete and 50% with min A for steadying .  Instructed pt in seated HS and HC stretch 2x1m64meach side with leg lifter for ankle DF assist.  Gait with L Blue rocker x10' with RW with max A  for weight shifting and LLE advancement. Also with hand rail in hall 2x25' with mod A for weight shifting and L HHA. Pt with improved step length and prolonged L stance time with cues and practice. Unable to remove show insert, so removed AFO before departing.  ______________________________  Second tx focused on NMR during all functional mobility, and gait/stairs for NMR with weight shifting and motor control. Focus of NMR was on standing balance as well as increasing anterior translation of COG over BOS for increased safety with tranfers. Pt was very fatigued from lots of morning therapy, which limited gait and stairs, but he was able to participate as much as safely possible.  Co-treat with rec therapy for balance components.  Attempted standing balance with Wii, but pt was limited in standing by fatigue. Attempted prolonged L weight shifting with R LE on 4" step while bowling, but pt uncomfortable with this level of challenge. He was able to stand 3x2mi77mith min A overall and 1UE support needed.   Performed gait with Mod A for weight shifting at hand rail and manual facilitation for LLE stability in stance. Pt needing cues for step length and timing, which he was able to adjust. Pt needed to sit due to fatigue at 25.' \  Performed serial transfers WC<>mat with min/mod A overall with focus on safety with hip clearance and set-up. Pt unable to adjust technique with cues and demo and was educated on safety and importance of controlled clearance and descent.   NMR in sitting with reaching/clipping task far over anterior BOS, requring pt to reach until hips cleared, weight bearing on LUE to  reach past bench to clip tray. Pt with good technique and improvement in clearance with cues and facilitation.  Pt left up in [facility] with all needs in reach.   --------------------------------------------  Tx focused on transfer training and Nustep for LE strengthening. Pt up in [facility], ready to go.  Performed WC x150'  with S.  Performed serial transfers with no carry-over from morning instructions. Demonstrated more of a stand-pivot transfer and pt able to progress to consistent Min A level.  Performed 50mn on Nustep with bil LEs only x891m of that time for increased strengthening and coordination/LLE control. Pt challenged to reach 45spm, which was difficult.    Therapy Documentation Precautions:  Precautions Precautions: Fall Precaution Comments: L knee chronic pain Restrictions Weight Bearing Restrictions: No    Vital Signs: Oxygen Therapy O2 Device: None (Room air) Pain: none   Locomotion : Ambulation Ambulation/Gait Assistance: 2: Max asFinancial controlleristance: 150   See FIM for current functional status  Therapy/Group: Individual Therapy and Co-Treatment CoKennieth RadPT, DPT  01/30/2014, 12:15 PM

## 2014-01-30 NOTE — Progress Notes (Signed)
Recreational Therapy Session Note  Patient Details  Name: [name] MRN: [id number] Date of Birth: [birth year] Today's Date: 01/30/2014  Pain: no c/o  Skilled Therapeutic Interventions/Progress Updates: Session focused on activity tolerance & dynamic standing balance.  Attempted to stand to play Wii bowling, pt unable to stand to play due to fatigue from the morning therapies. Pt did stand ~52min x3 with 1UE support & min A.    Therapy/Group: Co-Treatment Homer Pfeifer 01/30/2014, 12:45 PM

## 2014-01-31 ENCOUNTER — Inpatient Hospital Stay (HOSPITAL_COMMUNITY): Payer: Medicaid Other

## 2014-01-31 ENCOUNTER — Inpatient Hospital Stay (HOSPITAL_COMMUNITY): Payer: Medicaid Other | Admitting: Occupational Therapy

## 2014-01-31 MED ORDER — LISINOPRIL 5 MG PO TABS
5.0000 mg | ORAL_TABLET | Freq: Two times a day (BID) | ORAL | Status: DC
  Administered 2014-01-31 – 2014-02-07 (×15): 5 mg via ORAL
  Filled 2014-01-31 (×18): qty 1

## 2014-01-31 NOTE — Progress Notes (Signed)
This clinician is in agreement with the documentation for this treatment session.

## 2014-01-31 NOTE — Progress Notes (Signed)
Occupational Therapy Weekly Progress Note  Patient Details  Name: [name] MRN: [id number] Date of Birth: 1951-05-11  Beginning of progress report period: January 24, 2014 End of progress report period: January 31, 2014  Today's Date: 01/31/2014 Time: 0900-1000 and 1300 -1400 Time Calculation: 60 min and 60 min  STGs were not set initially as LOS was to be less than 10 days, but the therapy team has requested a longer length of stay based on new goals for ambulation. Pt can not access his bathroom with a w/c and would like to walk into the bathroom.    Patient continues to demonstrate the following deficits: left hemiparesis, decreased standing balance, and decreased safety awareness and problem solving and therefore will continue to benefit from skilled OT intervention to enhance overall performance with BADL.  Patient progressing toward long term goals..  Plan of care revisions: LTGs of bathing, LB dressing and toileting are upgraded to supervison with a min A goal to walk into the bathroom..  OT Short Term Goals Week 1:  OT Short Term Goal 1 (Week 1): STG=LTG Week 2:  OT Short Term Goal 1 (Week 2): STG = LTG  Skilled Therapeutic Interventions/Progress Updates:  Visit 1:  No c/o pain. Tx:   Pt seen for ADL retraining of bathing and dressing with focus on static and dynamic standing balance, functional transfers, functional mobility, activity tolerance, safety awareness, and postural control. Pt received supine in bed and agreeable to shower today. Completed supine>sit with extra time and min cues of encouragement. Performed sit<>stand with mod assist and ambulated using RW ~4 steps to w/c with min assist and max multimodal cues for LE placement and safety. Pt with decreased motivation to walk to shower however agreeable with mod cues of encouragement. Completed sit>stand and ambulated 4 steps using RW with min-mod assist and mod multimodal cues. Attempted sit>shower chair prematurely  requiring mod assist and mod cues for redirection. Completed bathing at shower level with mod cues for thoroughness, safety and lateral lean to wash back side. Transferred to w/c with steadying assist and donned shirt with min cues for sequencing. Performed LB dressing with extra time and max cues requiring min assist for sit>stand using RW and balance to manipulate clothing over hips. Pt with improved anterior shift forward to don socks and shoes. Practiced standing balance and postural alignment ~3 minutes without RW demonstrating improved postural control however pt required max cues to release grip on RW. Pt left sitting in w/c with PT present.  Visit 2: No c/o pain. Tx:  Pt seen for functional mobility skills to enhance his ability to ambulate into the bathroom. Pt received in bed, pt able to sit to EOB with Supervision with bed flat and no hand rail. Pt taken to gym to work on pre-functional ambulation skills using Parallel bars.   Pt worked on various activities to facilitate lateral weight shift, forward stepping with alternating feet, single leg stance exercises, squats. Pt continues to have great difficulty shifting to his R to advance his L leg, but was responding to therapy as he began to advance leg 30% of time with no assist.  Pt needed a few rest breaks, but demonstrated improved activity tolerance. Pt's PT arrived for his next session.     Continue OT 5-7 days a week 1-2x a day for 60-90 min with Balance/vestibular training;Cognitive remediation/compensation;Discharge planning;DME/adaptive equipment instruction;Functional mobility training;Pain management;Neuromuscular re-education;Patient/family education;Self Care/advanced ADL retraining;Therapeutic Activities;UE/LE Strength taining/ROM;UE/LE Coordination activities;Visual/perceptual remediation/compensation;Therapeutic Exercise;Disease mangement/prevention;Wheelchair propulsion/positioning to maximize  his independence with self care.     Therapy Documentation Precautions:  Precautions Precautions: Fall Precaution Comments: L knee chronic pain Restrictions Weight Bearing Restrictions: No    Pain:  Pt reporting no pain at this time.   See FIM for current functional status  Therapy/Group: Individual Therapy  [place] 01/31/2014, 1:54 PM

## 2014-01-31 NOTE — Progress Notes (Signed)
Recreational Therapy Session Note  Patient Details  Name: [name] MRN: [id number] Date of Birth: 07/31/1951 Today's Date: 01/31/2014  Pain: no c/o Skilled Therapeutic Interventions/Progress Updates: Session focused on psychosocial support.  Pt stating he is not ready to discharge this week.  Reiterated to pt that PT/OT were advocating for longer LOS in team conference today.  Pt requesting to go outside, continued discussion on discharge planning & pt stating that he "plans to quit drinking.  It's not good for me.  I think that's why I'm here now"  Encouragement provided.   [place] 01/31/2014, 12:14 PM

## 2014-01-31 NOTE — Progress Notes (Signed)
Social Work Patient ID: [name], male   DOB: Dec 12, 1950, 63 y.o.   MRN: [id number] Met with pt to inform of team conference progression toward goals-supervision/min level and the need to extend his discharge date to 7/1. He feels much better about this date he knew he was not ready to go this Friday.  Will inform bernadette-cousin with whom he lives with and  Work on discharge plan and  have her come in for family education.

## 2014-01-31 NOTE — Progress Notes (Signed)
63 y.o. 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 He was d/c five days ago and was awaiting mail order Rx of antiseizure medications and developed seizures X 2 on 01/21/14. He was admitted for workup due to seizures X 2 and FTT. He was hypotensive in ED and was treated with IVF bolus for acute renal failure with BUN/Cr- 39/2.81. Anemia acute on chronic drop, in Hgb to 6.5 was transfused with 2 units PRBC . Patient with villous adenoma excision 10/2013.  On NSAIDs at home for knee pain. No reports of hematochezia. Neurology recommended continuing Keppra at current dose and no formal consult needed  Subjective/Complaints: Pt without new issues Doesn't feel ready for 6/26 D/C   Review of Systems - Negative except left side weak and numb, poor appetite Objective: Vital Signs: Blood pressure 152/91, pulse 67, temperature 97.3 F (36.3 C), temperature source Oral, resp. rate 18, height 5' 8"  (1.727 m), weight 74.345 kg (163 lb 14.4 oz), SpO2 93.00%. No results found. No results found for this or any previous visit (from the past 72 hour(s)).   HEENT: edentulous Cardio: RRR and no murmurs Resp: CTA B/L and unlabored GI: BS positive and NT,ND Extremity:  Pulses positive and No Edema Skin:   Intact and Other healed G tube site Neuro: Alert/Oriented, Cranial Nerve II-XII normal, Abnormal Sensory Reduced sensation to LT, LUE and LLE, Abnormal Motor 5/5 in BUE, 5/5 RLE, 4/5 L HF, KE, 2- L ankle DF and PF, Abnormal FMC Ataxic/ dec FMC and Dysarthric Musc/Skel:  Normal Gen NAD   Assessment/Plan: 1. Functional deficits secondary to deconditioning after recurrent seizures and GI bleed requiring transfusion which require 3+ hours per day of interdisciplinary therapy in a comprehensive inpatient rehab setting. Physiatrist is  providing close team supervision and 24 hour management of active medical problems listed below. Physiatrist and rehab team continue to assess barriers to discharge/monitor patient progress toward functional and medical goals. Team conference today please see physician documentation under team conference tab, met with team face-to-face to discuss problems,progress, and goals. Formulized individual treatment plan based on medical history, underlying problem and comorbidities. FIM: FIM - Bathing Bathing Steps Patient Completed: Chest;Right Arm;Left Arm;Abdomen;Buttocks;Front perineal area;Right upper leg;Left upper leg;Right lower leg (including foot);Left lower leg (including foot) Bathing: 4: Steadying assist  FIM - Upper Body Dressing/Undressing Upper body dressing/undressing steps patient completed: Thread/unthread right sleeve of pullover shirt/dresss;Thread/unthread left sleeve of pullover shirt/dress;Put head through opening of pull over shirt/dress;Pull shirt over trunk Upper body dressing/undressing: 5: Set-up assist to: Obtain clothing/put away FIM - Lower Body Dressing/Undressing Lower body dressing/undressing steps patient completed: Thread/unthread right underwear leg;Thread/unthread left underwear leg;Pull underwear up/down;Thread/unthread right pants leg;Thread/unthread left pants leg;Pull pants up/down;Don/Doff right sock;Don/Doff left sock;Don/Doff right shoe;Don/Doff left shoe;Fasten/unfasten right shoe;Fasten/unfasten left shoe Lower body dressing/undressing: 4: Steadying Assist  FIM - Toileting Toileting steps completed by patient: Performs perineal hygiene Toileting Assistive Devices: Grab bar or rail for support Toileting: 2: Max-Patient completed 1 of 3 steps  FIM - Radio producer Devices: Grab bars Toilet Transfers: 4-To toilet/BSC: Min A (steadying Pt. > 75%);4-From toilet/BSC: Min A (steadying Pt. > 75%)  FIM - Bed/Chair Transfer Bed/Chair  Transfer Assistive Devices: Arm rests Bed/Chair Transfer: 4: Chair or W/C > Bed: Min A (steadying Pt. > 75%);3: Bed > Chair or W/C: Mod A (lift  or lower assist)  FIM - Locomotion: Wheelchair Distance: 150 Locomotion: Wheelchair: 6: Travels 150 ft or more, turns around, maneuvers to table, bed or toilet, negotiates 3% grade: maneuvers on rugs and over door sills independently FIM - Locomotion: Ambulation Locomotion: Ambulation Assistive Devices: Walker - Rolling;Orthosis Ambulation/Gait Assistance: 2: Max assist Locomotion: Ambulation: 1: Travels less than 50 ft with maximal assistance (Pt: 25 - 49%)  Comprehension Comprehension Mode: Auditory Comprehension: 5-Follows basic conversation/direction: With no assist  Expression Expression Mode: Verbal Expression: 4-Expresses basic 75 - 89% of the time/requires cueing 10 - 24% of the time. Needs helper to occlude trach/needs to repeat words.  Social Interaction Social Interaction: 6-Interacts appropriately with others with medication or extra time (anti-anxiety, antidepressant).  Problem Solving Problem Solving: 5-Solves basic problems: With no assist  Memory Memory: 6-More than reasonable amt of time  Medical Problem List and Plan:  1. Functional deficits secondary to Recurrent seizures, h/o CVA with left hemiparesis, deconditioning.  2. DVT Prophylaxis/Anticoagulation: Pharmaceutical: Lovenox  3. Pain Management: Will add Voltaren gel qid. Tylenol prn for pain  4. Mood: No signs of distress. LCSW to follow for evaluation and support.  5. Neuropsych: This patient is capable of making decisions on his own behalf.  6. Anemia of chronic disease: Will continue iron supplement. Monitor for signs of bleeding.  7. Acute renal failure: Improved after IVF and d/c of HCTZ/Lisinopril. 8. Seizure disorder: will continue Keppra 1500 mg bid  9. COPD: stable on spiriva  10. H/o alcohol abuse: on folic acid and thiamine. Denies recent use, pt now  aware ETOH lowers seizure potential 11. Hypokalemia: repleted 12. HTN: Will monitor every 8 hours. Blood pressures trending back up. Continue to hold HCTZ. resumed lisinopril at lower dose will titrate upwards--follow pattern  -recheck renal status later this week 13. Constipation: last BM 6/16  increased Senna S at bedtime and enema yesterday---2bm's yesterday.  14.  GI bleed recent, stool still OB + as expected, monitor Hgb  LOS (Days) 8 A FACE TO FACE EVALUATION WAS PERFORMED  [name],Esker E 01/31/2014, 7:18 AM

## 2014-01-31 NOTE — Progress Notes (Signed)
Physical Therapy Session Note  Patient Details  Name: [name] MRN: [id number] Date of Birth: [birth year]  Today's Date: 01/31/2014 Time: 8768-1157, 1405-1430; 1740-1750 Time Calculation (min): 38 min, 25 min, 10 min  Short Term Goals: Week 1:  PT Short Term Goal 1 (Week 1): = LTGs due to LOS PT Short Term Goal 1 - Progress (Week 1): Progressing toward goal Week 2:  PT Short Term Goal 1 (Week 2): =STG due to LOS  Skilled Therapeutic Interventions/Progress Updates:  Tx 1: No pain reported  Re-check of MMT LLE in sitting: hip flex grossly 3+/5; abd/add 2-/5, knee ext 3+/5. Pt unable to extend L knee to keep foot off floor when w/c is pushed by staff, due to weakness and inattention.  neuromuscular re-education via forced use, manual cues, mirror feedback for L stance stability in standing at hallway rail with wt shifting, with LUE support> no UE support;  gait wearing L Toe Off AFO, x 25', with mod assist, and on Kinetron at level 65 cm/sec focusing on optimizing hip and knee ext.    W/c propulsion using bil UEs x 150', holding crossed LEs off floor modified independent, for activity tolerance.  Gait focus on longer L step length, longer L stance time, wider BOS, activity tolerance. When attempting to extend L hip, he extends trunk instead.  Tx 2:  No pain reported  neuromuscular re-education via forced use, manual and VCS for LLE wt bearing, R><L wt shifting in standing, and terminal hip extension all in static stander.  3 x 10 terminal hip extension with rest breaks in static standing. L genu varus noted in standing.  W/c propulsion modified independent to return to room, using bil UEs, bil LEs on footrests.  Tx 3:  For missed minutes earlier in day-  Pt observed in his room, in bed having slid down into poor position for eating.  He had pushed tray aside and called for assistance to move up in bed.  With leading questions, pt problem-solved how to move himself with the use  of bed functions, to reposition himself without assistance.  Pt able to move bed to flat position, and with further cueing, use bil LEs to help push himself up in bed, without physical assist.  Therapist then positioned pt in more upright trunk position for eating, with lower part of bed less elevated; this info passed on to [place], [place].  Pt quickly began eating and required cueing not to talk while eating.  Pt coughed once while therapist in room.  Bed alarm on and all needs left in reach as therapist exited room.    Therapy Documentation Precautions:  Precautions Precautions: Fall Precaution Comments: L knee chronic pain Restrictions Weight Bearing Restrictions: No      See FIM for current functional status  Therapy/Group: Individual Therapy  COOK,CAROLINE 01/31/2014, 10:42 AM

## 2014-01-31 NOTE — Patient Care Conference (Signed)
Inpatient RehabilitationTeam Conference and Plan of Care Update Date: 01/31/2014   Time: 11;40 AM    Patient Name: [name]      Medical Record Number: [id number]  Date of Birth: [birth year] Sex: Male         Room/Bed: 4W18C/4W18C-01 Payor Info: Payor: MEDICAID [place] / Plan: MEDICAID [place] ACCESS / Product Type: *No Product type* /    Admitting Diagnosis: DECONDITIONED  Admit Date/Time:  01/23/2014  5:54 PM Admission Comments: No comment available   Primary Diagnosis:  <principal problem not specified> Principal Problem: <principal problem not specified>  Patient Active Problem List   Diagnosis Date Noted  . HTN (hypertension) 12/13/2013  . Anemia, iron deficiency 12/13/2013  . Anemia, chronic disease 12/12/2013  . Protein-calorie malnutrition, severe 12/04/2013  . Seizures 12/02/2013  . Acute-on-chronic kidney injury 12/02/2013  . Hypokalemia 12/01/2013  . Left-sided weakness, residual s/p cva 11/24/2013  . CVA (cerebral vascular accident) 11/23/2013  . Acute ischemic stroke 11/02/2013  . Generalized weakness 10/24/2013  . Aftercare following surgery of teeth, oral cavity or digestive system 10/23/2013  . Routine health maintenance 10/10/2013  . Prostate cancer 06/19/2013  . Localized swelling, mass, or lump of lower extremity 04/21/2013  . Hypertensive emergency 12/06/2012  . Stroke, acute, embolic 34/19/3790  . Malignant hypertension 12/05/2012  . Cerebral embolism with cerebral infarction 12/05/2012  . Dyslipidemia 12/05/2012  . Alcohol abuse 12/05/2012  . Claudication, intermittent 11/07/2012  . Smokes tobacco daily 10/18/2012  . History of stroke 10/14/2010  . Elevated PSA 05/25/2007  . Hypertension 05/18/2007  . COPD 05/18/2007    Expected Discharge Date: Expected Discharge Date: 02/07/14  Team Members Present: Physician leading conference: Dr. Alysia Penna Social Worker Present: Ovidio Kin, LCSW Nurse Present: Elliot Cousin, RN PT Present: Cameron Sprang, PT OT Present: Clyda Greener, OT SLP Present: Other (comment) Elmyra Ricks Page-SP) PPS Coordinator present : Daiva Nakayama, RN, CRRN     Current Status/Progress Goal Weekly Team Focus  Medical   chronic L HP, Slow progress  improve mobility  AFO eval and training   Bowel/Bladder     cont of B & B   Cont B & B     Swallow/Nutrition/ Hydration    Regular diet        ADL's   min A overall, min - mod squat pivot transfers, non ambulatory at this time  original goals set at min A, but pt can not access his home bathroom with his w/c. Pt has goal to walk into bathroom. LTGs for dressing, bathing, toileting will be modified to supervision if LOS is extended with a goal to walk into bathroom  ADL retraining, functional mobility, balance activities, pt/ family education   Mobility   Transfers mod A, WC with S x150,' Mod A gait x25,' mod/max A stairs  supervision basic tr, min assist car; supervision w/c x 150', min assist gait x 25' and up/down 5 steps  Neuro re-ed, dynamic balance, gait with AFO and RW, transfers   Communication     na        Safety/Cognition/ Behavioral Observations    no unsafe behaviors        Pain     pain managed-<3        Skin     no skin issues-monitor   continue to monitor his skin        *See Care Plan and progress notes for long and short-term goals.  Barriers to Discharge: acute on chronic debility, limited  family support    Possible Resolutions to Barriers:  extend DC    Discharge Planning/Teaching Needs:  Home with niece will have almost 24 hr care-maybe 1-2 hours alone.  Niece is trying to get him to quit smoking and drinking-pt aware of the risks      Team Discussion:  Progressing slower than thought and will need longer to reach discharge goals. Consult for AFO, motor control limits him.  Need family education  Revisions to Treatment Plan:  Extended discharge to 7/1 to meet his goals   Continued Need for Acute Rehabilitation Level of Care:  The patient requires daily medical management by a physician with specialized training in physical medicine and rehabilitation for the following conditions: Daily direction of a multidisciplinary physical rehabilitation program to ensure safe treatment while eliciting the highest outcome that is of practical value to the patient.: Yes Daily medical management of patient stability for increased activity during participation in an intensive rehabilitation regime.: Yes Daily analysis of laboratory values and/or radiology reports with any subsequent need for medication adjustment of medical intervention for : Neurological problems;Other  Dupree, Gardiner Rhyme 02/01/2014, 8:59 AM

## 2014-02-01 ENCOUNTER — Inpatient Hospital Stay (HOSPITAL_COMMUNITY): Payer: Medicaid Other | Admitting: *Deleted

## 2014-02-01 ENCOUNTER — Inpatient Hospital Stay (HOSPITAL_COMMUNITY): Payer: Medicaid Other | Admitting: Occupational Therapy

## 2014-02-01 ENCOUNTER — Inpatient Hospital Stay (HOSPITAL_COMMUNITY): Payer: Medicaid Other

## 2014-02-01 DIAGNOSIS — R5381 Other malaise: Secondary | ICD-10-CM

## 2014-02-01 DIAGNOSIS — R569 Unspecified convulsions: Secondary | ICD-10-CM

## 2014-02-01 DIAGNOSIS — J449 Chronic obstructive pulmonary disease, unspecified: Secondary | ICD-10-CM

## 2014-02-01 DIAGNOSIS — G8100 Flaccid hemiplegia affecting unspecified side: Secondary | ICD-10-CM

## 2014-02-01 LAB — BASIC METABOLIC PANEL
BUN: 11 mg/dL (ref 6–23)
CHLORIDE: 103 meq/L (ref 96–112)
CO2: 26 mEq/L (ref 19–32)
Calcium: 9.6 mg/dL (ref 8.4–10.5)
Creatinine, Ser: 0.9 mg/dL (ref 0.50–1.35)
GFR calc non Af Amer: 88 mL/min — ABNORMAL LOW (ref >90)
GLUCOSE: 94 mg/dL (ref 70–99)
Potassium: 3.9 mEq/L (ref 3.7–5.3)
Sodium: 140 mEq/L (ref 137–147)

## 2014-02-01 NOTE — Progress Notes (Signed)
Physical Therapy Session Note  Patient Details  Name: [name] MRN: [id number] Date of Birth: 1950-09-20  Today's Date: 02/01/2014 Time: 0900-1000, 9179-1505 Time Calculation (min): 60 min, 30 min  Short Term Goals: Week 1:  PT Short Term Goal 1 (Week 1): = LTGs due to LOS PT Short Term Goal 1 - Progress (Week 1): Progressing toward goal Week 2:  PT Short Term Goal 1 (Week 2): =STG due to LOS  Skilled Therapeutic Interventions/Progress Updates:    Tx 1: no pain reported  focus on neuromuscular re-education via forced use, manual cues for L stance stability and L swing phase components, through prolonged wt bearing in standing during card game, wearing L AFO  Lite Gait x 157', .3 to .5 mph, x 5 minutes +2 for wt shifting to facilitate L stepping and longer R step length.  Fluidity of gait improved during session.  Pt required 1 seated rest break.  Tx 2: no pain reported  Willette Alma delivered [place] L Toe Off AFO, and L shoe with toe cap.  Gait with new AFO, +2 hand hold assist, x 10'.  Pt had intermittent longer L and R step lengths, as he gained confidence with L stance stability wearing AFO.  W/c>< mat transfers as per FIM.  Up/down 4 steps 2 rails, min assist to ascend step-to, mod assist to descend step- to due to difficulty wt shifting in order to progress LLE.  Pt treated in quiet gym due to extreme distractibility and short attention; max, blunt VCS needed for focusing on L side during transfers, and during stairs management.   Therapy Documentation Precautions:  Precautions Precautions: Fall Precaution Comments: L knee chronic pain Restrictions Weight Bearing Restrictions: No   Pain: Pain Assessment Pain Assessment: No/denies pain    See FIM for current functional status  Therapy/Group: Individual Therapy  Christphor Groft 02/01/2014, 10:59 AM

## 2014-02-01 NOTE — Progress Notes (Signed)
Occupational Therapy Session Note  Patient Details  Name: [name] MRN: [id number] Date of Birth: [birth year]  Today's Date: 02/01/2014 Time: 0900-1000 and 1415-1500 Time Calculation: 60 min and 45 min  Short Term Goals: Week 2:  OT Short Term Goal 1 (Week 2): STG = LTG  Skilled Therapeutic Interventions/Progress Updates:   Visit 1:  Pain: no c/o pain Tx:  Pt seen for ADL retraining of dressing and toileting with focus on functional transfers, functional mobility, activity tolerance, motor planning and sequencing. Pt received sitting in w/c and declining shower today. Performed UB dressing with min cues for sequencing buttons and LB dressing with mod multimodal cues to problem solve crossing leg to don LLE first. Required min assist and max multimodal cues for body positioning and feet placement for sit>stand using RW and steadying assist for balance during clothing manipulation however demonstrating increased postural control and standing balance today. Pt requesting to use toilet and impulsively reaching prematurely for sit>stand during toilet transfer requiring max cues for redirection however transferred w/c<> toilet with min assist. Ambulated using RW from bathroom to EOB with extra time, min-mod assist for initiating use of LLE and max multimodal cues for sequencing steps with pt demonstrating decreased motor planning. Completed hand washing and oral care in standing at sink with min assist/cues demonstrating improved dynamic standing balance. Pt left sitting in w/c with all needs in reach.   Visit 2:  Pain:  No c/o pain Tx:  Pt seen this session for neuro re-ed of LLE with pre-ambulatory skills using the parallel bars for UE support. Pt worked on shifting weight to right to advance L foot forward and back sliding it on a pillow case and then weight shifting to left to step R foot over a stick. Pt completed 15x each leg for 3 sets.  Pt worked on sit to stand follow basic written step by  step instructions. He stated he liked the reminder card and felt that would be helpful to him.  Pt worked on w/c to mat transfers 5x focusing on forward lean and hip lift.  On mat, pt engaged in supine bridges and knee twist to stretch tight left lower back.  Pt returned to w/c to go back to his room. Pt sitting in w/c with call light in reach and all needs met.    Therapy Documentation Precautions:  Precautions Precautions: Fall Precaution Comments: L knee chronic pain Restrictions Weight Bearing Restrictions: No   Pain: Pain Assessment Pain Assessment: No/denies pain  ADL: See FIM for current functional status  Therapy/Group: Individual Therapy  Maryan Char 02/01/2014, 12:37 PM

## 2014-02-01 NOTE — Progress Notes (Signed)
63 y.o. 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 He was d/c five days ago and was awaiting mail order Rx of antiseizure medications and developed seizures X 2 on 01/21/14. He was admitted for workup due to seizures X 2 and FTT. He was hypotensive in ED and was treated with IVF bolus for acute renal failure with BUN/Cr- 39/2.81. Anemia acute on chronic drop, in Hgb to 6.5 was transfused with 2 units PRBC . Patient with villous adenoma excision 10/2013.  On NSAIDs at home for knee pain. No reports of hematochezia. Neurology recommended continuing Keppra at current dose and no formal consult needed  Subjective/Complaints: Pt without new issues Discussed with team yest, progressing slowly extended LOS by several days D/W RN, no VTE, cannot have anticoag due to recent GIB, will order SCD  Review of Systems - Negative except left side weak and numb, poor appetite Objective: Vital Signs: Blood pressure 142/70, pulse 75, temperature 97.9 F (36.6 C), temperature source Oral, resp. rate 18, height $RemoveBe'5\' 8"'[id number]$  (1.727 m), weight 74.345 kg (163 lb 14.4 oz), SpO2 99.00%. No results found. Results for orders placed during the hospital encounter of 01/23/14 (from the past 72 hour(s))  BASIC METABOLIC PANEL     Status: Abnormal   Collection Time    02/01/14  4:50 AM      Result Value Ref Range   Sodium 140  137 - 147 mEq/L   Potassium 3.9  3.7 - 5.3 mEq/L   Chloride 103  96 - 112 mEq/L   CO2 26  19 - 32 mEq/L   Glucose, Bld 94  70 - 99 mg/dL   BUN 11  6 - 23 mg/dL   Creatinine, Ser 0.90  0.50 - 1.35 mg/dL   Calcium 9.6  8.4 - 10.5 mg/dL   GFR calc non Af Amer 88 (*) >90 mL/min   GFR calc Af Amer >90  >90 mL/min   Comment: (NOTE)     The eGFR has been calculated using the CKD EPI equation.     This calculation has not been  validated in all clinical situations.     eGFR's persistently <90 mL/min signify possible Chronic Kidney     Disease.     HEENT: edentulous Cardio: RRR and no murmurs Resp: CTA B/L and unlabored GI: BS positive and NT,ND Extremity:  Pulses positive and No Edema Skin:   Intact and Other healed G tube site Neuro: Alert/Oriented, Cranial Nerve II-XII normal, Abnormal Sensory Reduced sensation to LT, LUE and LLE, Abnormal Motor 5/5 in BUE, 5/5 RLE, 4/5 L HF, KE, 2- L ankle DF and PF, Abnormal FMC Ataxic/ dec FMC and Dysarthric Musc/Skel:  Normal Gen NAD   Assessment/Plan: 1. Functional deficits secondary to deconditioning after recurrent seizures and GI bleed requiring transfusion which require 3+ hours per day of interdisciplinary therapy in a comprehensive inpatient rehab setting. Physiatrist is providing close team supervision and 24 hour management of active medical problems listed below. Physiatrist and rehab team continue to assess barriers to discharge/monitor patient progress toward functional and medical goals.  FIM: FIM - Bathing Bathing Steps Patient Completed: Chest;Right Arm;Left Arm;Abdomen;Buttocks;Front perineal area;Right upper leg;Left upper leg;Right lower leg (including foot);Left lower leg (including foot) Bathing: 4: Steadying assist  FIM - Upper Body Dressing/Undressing Upper body dressing/undressing steps patient completed: Thread/unthread right sleeve of  pullover shirt/dresss;Thread/unthread left sleeve of pullover shirt/dress;Put head through opening of pull over shirt/dress;Pull shirt over trunk Upper body dressing/undressing: 5: Supervision: Safety issues/verbal cues FIM - Lower Body Dressing/Undressing Lower body dressing/undressing steps patient completed: Thread/unthread right pants leg;Thread/unthread left pants leg;Pull pants up/down;Don/Doff right sock;Don/Doff left sock;Don/Doff right shoe;Don/Doff left shoe;Fasten/unfasten right shoe;Fasten/unfasten left  shoe Lower body dressing/undressing: 4: Steadying Assist  FIM - Toileting Toileting steps completed by patient: Performs perineal hygiene Toileting Assistive Devices: Grab bar or rail for support Toileting: 2: Max-Patient completed 1 of 3 steps  FIM - Radio producer Devices: Grab bars Toilet Transfers: 4-To toilet/BSC: Min A (steadying Pt. > 75%);4-From toilet/BSC: Min A (steadying Pt. > 75%)  FIM - Bed/Chair Transfer Bed/Chair Transfer Assistive Devices: Arm rests;HOB elevated;Bed rails;Walker Bed/Chair Transfer: 5: Supine > Sit: Supervision (verbal cues/safety issues);3: Bed > Chair or W/C: Mod A (lift or lower assist)  FIM - Locomotion: Wheelchair Distance: 150 Locomotion: Wheelchair: 6: Travels 150 ft or more, turns around, maneuvers to table, bed or toilet, negotiates 3% grade: maneuvers on rugs and over door sills independently FIM - Locomotion: Ambulation Locomotion: Ambulation Assistive Devices: Other (comment) (R rail in hallway) Ambulation/Gait Assistance: 3: Mod assist Locomotion: Ambulation: 1: Travels less than 50 ft with moderate assistance (Pt: 50 - 74%)  Comprehension Comprehension Mode: Auditory Comprehension: 5-Follows basic conversation/direction: With no assist  Expression Expression Mode: Verbal Expression: 3-Expresses basic 50 - 74% of the time/requires cueing 25 - 50% of the time. Needs to repeat parts of sentences.  Social Interaction Social Interaction: 5-Interacts appropriately 90% of the time - Needs monitoring or encouragement for participation or interaction.  Problem Solving Problem Solving: 4-Solves basic 75 - 89% of the time/requires cueing 10 - 24% of the time  Memory Memory: 4-Recognizes or recalls 75 - 89% of the time/requires cueing 10 - 24% of the time  Medical Problem List and Plan:  1. Functional deficits secondary to Recurrent seizures, h/o CVA with left hemiparesis, deconditioning.  2. DVT  Prophylaxis/Anticoagulation: SCD 3. Pain Management: Will add Voltaren gel qid. Tylenol prn for pain  4. Mood: No signs of distress. LCSW to follow for evaluation and support.  5. Neuropsych: This patient is capable of making decisions on his own behalf.  6. Anemia of chronic disease: Will continue iron supplement. Monitor for signs of bleeding.  7. Acute renal failure: Improved after IVF and d/c of HCTZ/Lisinopril. 8. Seizure disorder: will continue Keppra 1500 mg bid  9. COPD: stable on spiriva  10. H/o alcohol abuse: on folic acid and thiamine. Denies recent use, pt now aware ETOH lowers seizure potential 11. Hypokalemia: repleted 12. HTN: Will monitor every 8 hours. Blood pressures trending back up. Continue to hold HCTZ. resumed lisinopril at lower dose will titrate upwards--follow pattern  -recheck renal status later this week 13. Constipation: last BM 6/16  increased Senna S at bedtime and enema yesterday---2bm's yesterday.  14.  GI bleed recent, stool still OB + as expected, monitor Hgb  LOS (Days) 9 A FACE TO FACE EVALUATION WAS PERFORMED  [name],Mostafa E 02/01/2014, 7:31 AM

## 2014-02-01 NOTE — Progress Notes (Signed)
Recreational Therapy Session Note  Patient Details  Name: [name] MRN: [id number] Date of Birth: 1950/10/08 Today's Date: 02/01/2014  Pain: no c/o Skilled Therapeutic Interventions/Progress Updates: Session focused on activity tolerance, standing balance, BUE use.  Pt stood at tabletop to shuffle, deal & play cards while standing.  Pt required mod cues/min assist for balance.  Pt does not trust LLE and prefers to put weight on RLE in standing in which cuing & assistance is needed to distribute weight through BLE's.  Pt required mod cuing for speech intelligeabilitiy at conversational level.  Gustavus 02/01/2014, 11:27 AM

## 2014-02-02 ENCOUNTER — Inpatient Hospital Stay (HOSPITAL_COMMUNITY): Payer: Medicaid Other | Admitting: *Deleted

## 2014-02-02 ENCOUNTER — Inpatient Hospital Stay (HOSPITAL_COMMUNITY): Payer: Medicaid Other | Admitting: Occupational Therapy

## 2014-02-02 DIAGNOSIS — J449 Chronic obstructive pulmonary disease, unspecified: Secondary | ICD-10-CM

## 2014-02-02 DIAGNOSIS — R5381 Other malaise: Secondary | ICD-10-CM

## 2014-02-02 DIAGNOSIS — R569 Unspecified convulsions: Secondary | ICD-10-CM

## 2014-02-02 DIAGNOSIS — G8100 Flaccid hemiplegia affecting unspecified side: Secondary | ICD-10-CM

## 2014-02-02 NOTE — Progress Notes (Signed)
63 y.o. 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 He was d/c five days ago and was awaiting mail order Rx of antiseizure medications and developed seizures X 2 on 01/21/14. He was admitted for workup due to seizures X 2 and FTT. He was hypotensive in ED and was treated with IVF bolus for acute renal failure with BUN/Cr- 39/2.81. Anemia acute on chronic drop, in Hgb to 6.5 was transfused with 2 units PRBC . Patient with villous adenoma excision 10/2013.  On NSAIDs at home for knee pain. No reports of hematochezia. Neurology recommended continuing Keppra at current dose and no formal consult needed  Subjective/Complaints: Has dentures prepared by Dr Enrique Sack, will get after D/C No new c/os, remains motivated  Review of Systems - Negative except left side weak and numb, poor appetite Objective: Vital Signs: Blood pressure 131/67, pulse 74, temperature 97 F (36.1 C), temperature source Oral, resp. rate 18, height 5' 8"  (1.727 m), weight 74.345 kg (163 lb 14.4 oz), SpO2 96.00%. No results found. Results for orders placed during the hospital encounter of 01/23/14 (from the past 72 hour(s))  BASIC METABOLIC PANEL     Status: Abnormal   Collection Time    02/01/14  4:50 AM      Result Value Ref Range   Sodium 140  137 - 147 mEq/L   Potassium 3.9  3.7 - 5.3 mEq/L   Chloride 103  96 - 112 mEq/L   CO2 26  19 - 32 mEq/L   Glucose, Bld 94  70 - 99 mg/dL   BUN 11  6 - 23 mg/dL   Creatinine, Ser 0.90  0.50 - 1.35 mg/dL   Calcium 9.6  8.4 - 10.5 mg/dL   GFR calc non Af Amer 88 (*) >90 mL/min   GFR calc Af Amer >90  >90 mL/min   Comment: (NOTE)     The eGFR has been calculated using the CKD EPI equation.     This calculation has not been validated in all clinical situations.     eGFR's persistently <90 mL/min signify  possible Chronic Kidney     Disease.     HEENT: edentulous Cardio: RRR and no murmurs Resp: CTA B/L and unlabored GI: BS positive and NT,ND Extremity:  Pulses positive and No Edema Skin:   Intact and Other healed G tube site Neuro: Alert/Oriented, Cranial Nerve II-XII normal, Abnormal Sensory Reduced sensation to LT, LUE and LLE, Abnormal Motor 5/5 in BUE, 5/5 RLE, 4/5 L HF, KE, 2- L ankle DF and PF, Abnormal FMC Ataxic/ dec FMC and Dysarthric Musc/Skel:  Normal Gen NAD   Assessment/Plan: 1. Functional deficits secondary to deconditioning after recurrent seizures and GI bleed requiring transfusion which require 3+ hours per day of interdisciplinary therapy in a comprehensive inpatient rehab setting. Physiatrist is providing close team supervision and 24 hour management of active medical problems listed below. Physiatrist and rehab team continue to assess barriers to discharge/monitor patient progress toward functional and medical goals.  FIM: FIM - Bathing Bathing Steps Patient Completed: Chest;Right Arm;Left Arm;Abdomen;Buttocks;Front perineal area;Right upper leg;Left upper leg;Right lower leg (including foot);Left lower leg (including foot) Bathing: 4: Steadying assist  FIM - Upper Body Dressing/Undressing Upper body dressing/undressing steps patient completed: Thread/unthread right sleeve of front closure shirt/dress;Thread/unthread left sleeve of front closure shirt/dress;Pull shirt around back of front  closure shirt/dress;Button/unbutton shirt Upper body dressing/undressing: 5: Supervision: Safety issues/verbal cues FIM - Lower Body Dressing/Undressing Lower body dressing/undressing steps patient completed: Thread/unthread right pants leg;Thread/unthread left pants leg;Pull pants up/down;Thread/unthread right underwear leg;Thread/unthread left underwear leg;Pull underwear up/down Lower body dressing/undressing: 4: Steadying Assist  FIM - Toileting Toileting steps completed by  patient: Performs perineal hygiene;Adjust clothing prior to toileting;Adjust clothing after toileting Toileting Assistive Devices: Grab bar or rail for support Toileting: 4: Steadying assist  FIM - Radio producer Devices: Elevated toilet seat;Grab bars;Walker Toilet Transfers: 4-To toilet/BSC: Min A (steadying Pt. > 75%);4-From toilet/BSC: Min A (steadying Pt. > 75%)  FIM - Bed/Chair Transfer Bed/Chair Transfer Assistive Devices: Arm rests Bed/Chair Transfer: 4: Chair or W/C > Bed: Min A (steadying Pt. > 75%);3: Bed > Chair or W/C: Mod A (lift or lower assist)  FIM - Locomotion: Wheelchair Distance: 150 Locomotion: Wheelchair: 6: Travels 150 ft or more, turns around, maneuvers to table, bed or toilet, negotiates 3% grade: maneuvers on rugs and over door sills independently FIM - Locomotion: Ambulation Locomotion: Ambulation Assistive Devices: Lite Gait Ambulation/Gait Assistance: 1: +2 Total assist Locomotion: Ambulation: 1: Two helpers  Comprehension Comprehension Mode: Auditory Comprehension: 5-Follows basic conversation/direction: With no assist  Expression Expression Mode: Verbal Expression: 4-Expresses basic 75 - 89% of the time/requires cueing 10 - 24% of the time. Needs helper to occlude trach/needs to repeat words.  Social Interaction Social Interaction: 6-Interacts appropriately with others with medication or extra time (anti-anxiety, antidepressant).  Problem Solving Problem Solving: 5-Solves basic problems: With no assist  Memory Memory: 6-More than reasonable amt of time  Medical Problem List and Plan:  1. Functional deficits secondary to Recurrent seizures, h/o CVA with left hemiparesis, deconditioning.  2. DVT Prophylaxis/Anticoagulation: SCD 3. Pain Management: Will add Voltaren gel qid. Tylenol prn for pain  4. Mood: No signs of distress. LCSW to follow for evaluation and support.  5. Neuropsych: This patient is capable of making  decisions on his own behalf.  6. Anemia of chronic disease: Will continue iron supplement. Monitor for signs of bleeding.  7. Acute renal failure: Improved after IVF and d/c of HCTZ/Lisinopril. 8. Seizure disorder: will continue Keppra 1500 mg bid  9. COPD: stable on spiriva  10. H/o alcohol abuse: on folic acid and thiamine. Denies recent use, pt now aware ETOH lowers seizure potential 11. Hypokalemia: repleted 12. HTN: Will monitor every 8 hours. Blood pressures trending back up. Continue to hold HCTZ. resumed lisinopril at lower dose will titrate upwards--follow pattern  -recheck renal status later this week 13. Constipation: last BM 6/16  increased Senna S at bedtime and enema yesterday---2bm's yesterday.  14.  GI bleed recent, stool still OB + as expected, monitor Hgb  LOS (Days) 10 A FACE TO FACE EVALUATION WAS PERFORMED  [name],Phillippe E 02/02/2014, 7:11 AM

## 2014-02-02 NOTE — Progress Notes (Signed)
Occupational Therapy Session Note  Patient Details  Name: [name] MRN: [id number] Date of Birth: 04-01-1951  Today's Date: 02/02/2014 Time: 0800-0900 and 1100-1130 Time Calculation: 60 min and 30 min  Short Term Goals: Week 2:  OT Short Term Goal 1 (Week 2): STG = LTG  Skilled Therapeutic Interventions/Progress Updates:     Visit 1:  No c/o pain.  Tx:  Pt seen for ADL retraining with focus on functional transfers, functional mobility, command following, problem solving, activity tolerance, balance, and sequencing. Pt received sitting in w/c and agreeable to shower today. Completed sit>stand with min assist using RW and directions via external aid with mod cues for problem solving and positioning. Ambulated using RW to shower with min-mod assist for initiation of LLE step and max multimodal cues for sequencing secondary to decreased motor planning. Transferred to shower chair with min assist and mod cues. Performed bathing at shower level requiring max multimodal cues for lateral weight shift to wash back side secondary to poor frustration tolerance, decreased problem solving, decreased command following and restless behavior. Performed UB dressing in w/c requiring max cues for problem solving and mod cues and extra time for LB dressing. Required min assist/mod cues for sit>stand using RW to manipulate clothing over hips demonstrating improved balance. Practiced sit<>stand using external aid directions with min-max cues with pt demonstrating improved stand>sit. Pt left sitting in w/c with all needs in reach.   Visit 2:  No c/o pain.  Tx:  Pt seen this session for dynamic balance activities to enhance weight shifting to allow him to bathe more easily. Pt worked on reaching laterally pushing a large ball to the side, reaching ball overhead and reaching laterally, pushing down on mat with hand to increase hip hikes. Pt was calm during session and followed directions with min cues. Sit to stand  numerous times from various mat heights focusing on forward lean with hips extending back. Pt returned to his room at end of session with all needs in reach.   Therapy Documentation Precautions:  Precautions Precautions: Fall Precaution Comments: L knee chronic pain Restrictions Weight Bearing Restrictions: No  Pain: Pt reporting no pain at this time.   ADL:  See FIM for current functional status  Therapy/Group: Individual Therapy  Spake,Lauren Brooke 02/02/2014, 10:52 AM

## 2014-02-02 NOTE — Progress Notes (Signed)
Social Work Patient ID: [name], male   DOB: 07/28/1951, 63 y.o.   MRN: [id number] Spoke with bernadette-cousin/POA to discuss team conference goals and ned ing to sty until 7/1 to reach his goals. She was agreeable and will plan to come in next week.  Discussed delivering the hospital bed, will plan for 6/30. Work toward discharge 7/1.

## 2014-02-02 NOTE — Progress Notes (Signed)
Physical Therapy Session Note  Patient Details  Name: [name] MRN: [id number] Date of Birth: 06/09/1951  Today's Date: 02/02/2014 Time: 0930-1030 and 14:16-14:02 (76min)  Time Calculation (min): 60 min  Short Term Goals: Week 2:  PT Short Term Goal 1 (Week 2): =STG due to LOS  Skilled Therapeutic Interventions/Progress Updates:  First tx focused on functional mobility, gait training with RW, and NMR via forced use, manual facilitation, and verbal cues for weight-shifting and motor control tasks. Provided education and encouragement surrounding pt taking on more independence with safety and mobility. Pt understanding, but seems unsure that he'll drop old habits once discharged. Emotional support provided.   Performed toilet and WC transfers with min/mod A and cues for hand placement and safety. Little carry-over from previous therapies regarding safe technique. Pt continues to sit prematurely with incomplete turn and skip set-up steps of transfers.  Pt needed Min standing assist for balance and cues to perform own self-cares.  WC propulsion on unit with bil LEs only x150' Mod I.   NMR on kinetron x10 min in sitting/standing depending on fatigue level, focus on L weight shifting, cues and facilitation for full L shift. Pt fatigued due to over use RLE.   Performed gait training, demonstration and reminders about focus of gait for functional improvement. Pt able to verbalize sequence and technique and showed improvement in weight-shifting. Max A needed for shifting and 50% LLE advancement for fluid pattern. Pt attempted to reach hall rail on R repeatedly, but improved with practice. Very fatigued at 64' with RW, needed sitting rest.  ________________________________  Second tx focused on furniture transfers, stairs, and curb step. Discussed home access and plans. Hoping to connect with cousin this afternoon, but she did not arrive. SW will set-up family training early next week.    Instructed pt in serial squat-pivot transfers WC<>bed<>BSC x10 with only 2 trials at S level, otherwise min A due to continually sitting on wheel rather than completing turn, as well as uncontrolled descent. Provided extensive demo and set-up cues, letting pt problem-solve and error correct independently. Discussed safety implications of unsafe transfers.   Instructed pt in 4 stairs with L rail sideways x2 with solid min A and cues for foot placement and safety. Performed curb step with RW and solid Min A with cues for technique and sequence. Pt needed encouragement to stay standing when he gets afraid to attempt new task.      Therapy Documentation Precautions:  Precautions Precautions: Fall Precaution Comments: L knee chronic pain Restrictions Weight Bearing Restrictions: No General:   Vital Signs: Therapy Vitals Pulse Rate: 84 BP: 137/80 mmHg Patient Position (if appropriate): Sitting Oxygen Therapy SpO2: 100 % O2 Device: None (Room air) Pain: Pain Assessment Pain Assessment: No/denies pain Locomotion : Ambulation Ambulation/Gait Assistance: 2: Max Financial controller Distance: 150   See FIM for current functional status  Therapy/Group: Individual Therapy [name], PT, DPT  02/02/2014, 10:56 AM

## 2014-02-03 ENCOUNTER — Inpatient Hospital Stay (HOSPITAL_COMMUNITY): Payer: Medicaid Other | Admitting: *Deleted

## 2014-02-03 DIAGNOSIS — J441 Chronic obstructive pulmonary disease with (acute) exacerbation: Secondary | ICD-10-CM

## 2014-02-03 DIAGNOSIS — R5381 Other malaise: Secondary | ICD-10-CM

## 2014-02-03 DIAGNOSIS — I10 Essential (primary) hypertension: Secondary | ICD-10-CM

## 2014-02-03 DIAGNOSIS — R5383 Other fatigue: Secondary | ICD-10-CM

## 2014-02-03 NOTE — Progress Notes (Signed)
Patient ID: [name], male   DOB: [birth year], 63 y.o.   MRN: [id number]  02/03/14.  63 y.o. 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 He was d/c five days ago and was awaiting mail order Rx of antiseizure medications and developed seizures X 2 on 01/21/14. He was admitted for workup due to seizures X 2 and FTT. He was hypotensive in ED and was treated with IVF bolus for acute renal failure with BUN/Cr- 39/2.81. Anemia acute on chronic drop, in Hgb to 6.5 was transfused with 2 units PRBC . Patient with villous adenoma excision 10/2013.  On NSAIDs at home for knee pain. No reports of hematochezia. Neurology recommended continuing Keppra at current dose and no formal consult needed  Subjective/Complaints:  Anticipating d/c on Tuesday.  No new c/os, remains motivated  Review of Systems - Negative except left side weak and numb, poor appetite Objective:   Intake/Output Summary (Last 24 hours) at 02/03/14 0953 Last data filed at 02/02/14 1957  Gross per 24 hour  Intake    720 ml  Output   1200 ml  Net   -480 ml    Patient Vitals for the past 24 hrs:  BP Temp Temp src Pulse Resp SpO2  02/03/14 0946 - - - - - 96 %  02/03/14 0945 137/74 mmHg - - - - -  02/03/14 0650 135/72 mmHg 97.6 F (36.4 C) Oral 72 18 97 %  02/02/14 2124 164/83 mmHg 98.2 F (36.8 C) Oral 69 18 98 %  02/02/14 1418 145/86 mmHg 98 F (36.7 C) Oral 79 17 98 %  02/02/14 1025 137/80 mmHg - - 84 - 100 %    HEENT: edentulous Cardio: RRR and no murmurs Resp: CTA B/L and unlabored GI: BS positive and NT,ND Extremity:  Pulses positive and No Edema Skin:   Intact and Other healed G tube site Neuro: Alert/Oriented, Cranial Nerve II-XII normal, Abnormal Sensory Reduced sensation to LT, LUE and LLE, Abnormal Motor 5/5 in BUE, 5/5 RLE, 4/5 L HF,  KE, 2- L ankle DF and PF, Abnormal FMC Ataxic/ dec FMC and Dysarthric Musc/Skel:  Normal Gen NAD   Assessment/Plan: Medical Problem List and Plan:  1. Functional deficits secondary to Recurrent seizures, h/o CVA with left hemiparesis, deconditioning.  2. DVT Prophylaxis/Anticoagulation: SCD 3. Pain Management: Will add Voltaren gel qid. Tylenol prn for pain   4.  Neuropsych: This patient is capable of making decisions on his own behalf.  5. Anemia of chronic disease: Will continue iron supplement. Monitor for signs of bleeding.  6. Acute renal failure: Improved after IVF and d/c of HCTZ/Lisinopril. 7.  Seizure disorder: will continue Keppra 1500 mg bid  8. COPD: stable on spiriva  9. H/o alcohol abuse: on folic acid and thiamine. Denies recent use, pt now aware ETOH lowers seizure potential  10. HTN: Will monitor every 8 hours. Blood pressures trending back up. Continue to hold HCTZ. resumed lisinopril at lower dose will titrate upwards--follow pattern  -recheck renal status later this week  11.  GI bleed recent, stool still OB + as expected, monitor Hgb  LOS (Days) 11 A FACE TO FACE EVALUATION WAS PERFORMED  Nyoka Cowden 02/03/2014, 9:51 AM

## 2014-02-03 NOTE — Progress Notes (Signed)
Occupational Therapy Session Note  Patient Details  Name: [name] MRN: [id number] Date of Birth: [birth year]  Today's Date: 02/03/2014 Time:  -   1445-1530  (45 min)    Short Term Goals: Week 1:  OT Short Term Goal 1 (Week 1): STG=LTG Week 2:  OT Short Term Goal 1 (Week 2): STG = LTG  Skilled Therapeutic Interventions/Progress Updates:    Pt. Sitting in wc upon OT arrival.  Pt reported he was going home on Tuesday.  Addressed LUE AROM and strengthening, functional mobility to bathroom and transfers.  Performed UE exercises with no weights on RUE.  Focused on achieving end range with LUE  Pt. Propelled wc to doorway to bathroom.  Ambulated with RW to toilet with mod assist for advancing LLE and multi modal cues for sequencing steps.  Pt. Unable to advance LLE.  Pt. Transferred from toilet to wc with min assist and facilitation for controlled movements.  Pt. Propelled wc back to bed area with minimal assist for maneuvering in tight spaces.  Left pt in wc with   call bell,phone within reach.    Therapy Documentation Precautions:  Precautions Precautions: Fall Precaution Comments: L knee chronic pain Restrictions Weight Bearing Restrictions: No   Pain:  none         See FIM for current functional status  Therapy/Group: Individual Therapy  Lisa Roca 02/03/2014, 3:20 PM

## 2014-02-04 ENCOUNTER — Inpatient Hospital Stay (HOSPITAL_COMMUNITY): Payer: Medicaid Other | Admitting: Occupational Therapy

## 2014-02-04 ENCOUNTER — Inpatient Hospital Stay (HOSPITAL_COMMUNITY): Payer: Self-pay

## 2014-02-04 DIAGNOSIS — I6350 Cerebral infarction due to unspecified occlusion or stenosis of unspecified cerebral artery: Secondary | ICD-10-CM

## 2014-02-04 NOTE — Progress Notes (Signed)
Occupational Therapy Session Note  Patient Details  Name: [name] MRN: [id number] Date of Birth: 1951/07/29  Today's Date: 02/04/2014 Time: 0930-1015 Time Calculation (min): 45 min  Skilled Therapeutic Interventions/Progress Updates: ADL in w/c at sink with focus on safety such as locking breaks before standing and dynamic balance with sit to stand and dynamic standing.  Patient exhibited a posterior lean when he became fatigued after standing to wash periarea/buttocks but was able to right himself.   W/c to recliner chair stand pivot transfer  required close S due to slight instability      Therapy Documentation Precautions:  Precautions Precautions: Fall Precaution Comments: L knee chronic pain Restrictions Weight Bearing Restrictions: No  Pain: Pain Assessment Pain Assessment: No/denies pain  See FIM for current functional status  Therapy/Group: Individual Therapy  Alfredia Ferguson [facility] 02/04/2014, 10:16 AM

## 2014-02-04 NOTE — Progress Notes (Signed)
Patient ID: [name], male   DOB: Oct 01, 1950, 63 y.o.   MRN: [id number]   Patient ID: [name], male   DOB: [birth year], 63 y.o.   MRN: [id number]  02/04/14.  63 y.o. 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 He was d/c five days ago and was awaiting mail order Rx of antiseizure medications and developed seizures X 2 on 01/21/14. He was admitted for workup due to seizures X 2 and FTT. He was hypotensive in ED and was treated with IVF bolus for acute renal failure with BUN/Cr- 39/2.81. Anemia acute on chronic drop, in Hgb to 6.5 was transfused with 2 units PRBC . Patient with villous adenoma excision 10/2013.  On NSAIDs at home for knee pain. No reports of hematochezia. Neurology recommended continuing Keppra at current dose and no formal consult needed  Subjective/Complaints:  Anticipating d/c on Tuesday.  No new c/os, remains motivated  Review of Systems -  Negative   Past Medical History  Diagnosis Date  . Hypertension 05/18/2007  . COPD 05/18/2007  . Elevated PSA 05/25/2007  . Prostate cancer   . Seizures 12/05/2013; 12/08/2013; 12/11/2013    new onset; recurrent/notes 12/12/2013  . 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 Stroke 12/05/2012  . Cerebral embolism with cerebral infarction     residual left sided weakness/notes 12/12/2013  . Protein-calorie malnutrition, severe     [name] 12/12/2013  . Chronic kidney disease (CKD), stage II (mild)     [name] 12/12/2013    Objective:   Intake/Output Summary (Last 24 hours) at 02/04/14 0850 Last data filed at 02/03/14 1716  Gross per 24 hour  Intake    720 ml  Output    950 ml  Net   -230 ml    Patient Vitals for the past 24 hrs:  BP Temp Temp src Pulse Resp SpO2  02/04/14 0526 146/85 mmHg 97.1 F (36.2 C) Oral 81 18 98 %  02/03/14 2126  138/79 mmHg 97.6 F (36.4 C) Oral 65 16 93 %  02/03/14 1803 127/79 mmHg - - - - -  02/03/14 1434 122/80 mmHg 98.2 F (36.8 C) Oral 78 16 99 %  02/03/14 0946 - - - - - 96 %  02/03/14 0945 137/74 mmHg - - - - -    HEENT: edentulous Cardio: RRR and no murmurs Resp: CTA B/L and unlabored GI: BS positive and NT,ND Extremity:  Pulses positive and No Edema Skin:   Intact and Other healed G tube site Neuro: Alert/Oriented, Cranial Nerve II-XII normal, Abnormal Sensory Reduced sensation to LT, LUE and LLE, Abnormal Motor 5/5 in BUE, 5/5 RLE, 4/5 L HF, KE, 2- L ankle DF and PF, Abnormal FMC Ataxic/ dec FMC and Dysarthric Musc/Skel:  Normal Gen NAD   Assessment/Plan: Medical Problem List and Plan:  1. Functional deficits secondary to Recurrent seizures, h/o CVA with left hemiparesis, deconditioning.  2. DVT Prophylaxis/Anticoagulation: SCD 3. Pain Management: Will add Voltaren gel qid. Tylenol prn for pain   4.  Neuropsych: This patient is capable of making decisions on his own behalf.  5. Anemia of chronic disease: Will continue iron supplement. Monitor for signs of bleeding.  6. Acute renal failure: Improved after IVF and d/c of HCTZ/Lisinopril. 7.  Seizure disorder: will continue Keppra 1500 mg bid  8. COPD:  stable on spiriva  9. H/o alcohol abuse: on folic acid and thiamine. Denies recent use, pt now aware ETOH lowers seizure potential  10. HTN:  Controlled 11.  GI bleed recent, stool still OB + as expected, monitor Hgb  LOS (Days) 12 A FACE TO FACE EVALUATION WAS PERFORMED  Nyoka Cowden 02/04/2014, 8:50 AM

## 2014-02-05 ENCOUNTER — Inpatient Hospital Stay (HOSPITAL_COMMUNITY): Payer: Self-pay | Admitting: Physical Therapy

## 2014-02-05 ENCOUNTER — Inpatient Hospital Stay (HOSPITAL_COMMUNITY): Payer: Medicaid Other | Admitting: Occupational Therapy

## 2014-02-05 ENCOUNTER — Inpatient Hospital Stay (HOSPITAL_COMMUNITY): Payer: Medicaid Other

## 2014-02-05 DIAGNOSIS — R5381 Other malaise: Secondary | ICD-10-CM

## 2014-02-05 DIAGNOSIS — R569 Unspecified convulsions: Secondary | ICD-10-CM

## 2014-02-05 DIAGNOSIS — G8100 Flaccid hemiplegia affecting unspecified side: Secondary | ICD-10-CM

## 2014-02-05 DIAGNOSIS — J449 Chronic obstructive pulmonary disease, unspecified: Secondary | ICD-10-CM

## 2014-02-05 LAB — CBC
HEMATOCRIT: 28.4 % — AB (ref 39.0–52.0)
Hemoglobin: 8.9 g/dL — ABNORMAL LOW (ref 13.0–17.0)
MCH: 26.4 pg (ref 26.0–34.0)
MCHC: 31.3 g/dL (ref 30.0–36.0)
MCV: 84.3 fL (ref 78.0–100.0)
Platelets: 267 10*3/uL (ref 150–400)
RBC: 3.37 MIL/uL — ABNORMAL LOW (ref 4.22–5.81)
RDW: 17.3 % — AB (ref 11.5–15.5)
WBC: 7.2 10*3/uL (ref 4.0–10.5)

## 2014-02-05 LAB — OCCULT BLOOD X 1 CARD TO LAB, STOOL: FECAL OCCULT BLD: POSITIVE — AB

## 2014-02-05 NOTE — Progress Notes (Signed)
Social Work Patient ID: [name], male   DOB: Jun 22, 1951, 63 y.o.   MRN: [id number] Contacted Bernadette-cousin/POA to schedule family education for tomorrow.  Awaiting her return call regarding time she can be here. Will try to get her to come in tomorrow prior to his discharge.  Have completed PCS paperwork and will send into [place] to get services started. Have made referral for hospital bed.  Pt needs for positioning and pain management.  He needs to be able to elevate for his COPD which a wedge does not elevate him Enough.  He also has seizures which a hospital bed will keep him safe.

## 2014-02-05 NOTE — Progress Notes (Signed)
Occupational Therapy Session Note  Patient Details  Name: [name] MRN: [id number] Date of Birth: March 25, 1951  Today's Date: 02/05/2014 Time: 1425-1510 Time Calculation (min): 45 min  Short Term Goals: Week 2:  OT Short Term Goal 1 (Week 2): STG = LTG  Skilled Therapeutic Interventions/Progress Updates:  Patient resting in w/c upon arrival.  Engaged in w/c><bsc transfers and dynamic standing.  Focused session on concern that he will be alone at home for periods of the day and his goals are not set for him to be alone at home secondary to he is still a fall risk during functional mobility, toileting, toilet transfers and self care tasks.  Since bathing and dressing can be performed when patient's cousin is present, the focus of this session was to practice safe w/c><BSC squat pivot transfers and balance for clothing management.  Patient has hind written steps to on his walker on how to perform sit><stand using walker however, he continues to require cues for safety.  Practiced ~10 sit><stands and ~10 squat pivot transfers however, patient continues to require cues for safety.  Therapy Documentation Precautions:  Precautions Precautions: Fall Precaution Comments: L knee chronic pain Restrictions Weight Bearing Restrictions: No Pain: Pain Assessment Pain Assessment: No/denies pain See FIM for current functional status  Therapy/Group: Individual Therapy  SHAFFER, CHRISTINA 02/05/2014, 5:22 PM

## 2014-02-05 NOTE — Progress Notes (Signed)
Occupational Therapy Session Note  Patient Details  Name: [name] MRN: [id number] Date of Birth: 1951-03-31  Today's Date: 02/05/2014 Time: 1000-1115 Time Calculation (min): 75 min  Short Term Goals: Week 2:  OT Short Term Goal 1 (Week 2): STG = LTG  Skilled Therapeutic Interventions/Progress Updates:      Pt seen for BADL retraining of toileting, bathing, and dressing with a focus on sequencing with ambulation and with sit>< stand skills. Pt was in gym at start of session and worked on basic squat pivot transfer w/c >< mat 3x focusing on forward lean.  Pt was transported to his room and he gathered his clothing out of drawers and began grooming tasks at sink.  Pt worked on sit to stand focusing on foot placement and forward lean. Pt demonstrated improved control with this through out the entire session of dressing and toileting to allow him to stand with only close S.  He had improved trunk rotation as he had to reach down to feet to don socks. He required a great of A to advance L foot with RW to walk to toilet and then to shower bench. Pt completed the rest of his transfers with min A using squat pivot. Pt had an extended session as he had to use BR a second time.  Pt resting in chair at end of session with call light in reach and all needs met.  Therapy Documentation Precautions:  Precautions Precautions: Fall Precaution Comments: L knee chronic pain Restrictions Weight Bearing Restrictions: No    Vital Signs: Oxygen Therapy O2 Device: None (Room air) Pain: Pain Assessment Pain Assessment: No/denies pain  ADL:  See FIM for current functional status  Therapy/Group: Individual Therapy  Kallen Mccrystal 02/05/2014, 12:04 PM

## 2014-02-05 NOTE — Progress Notes (Addendum)
Physical Therapy Session Note  Patient Details  Name: [name] MRN: [id number] Date of Birth: May 05, 1951  Today's Date: 02/05/2014 Time: 0900-1000 Time Calculation (min): 60 min  Short Term Goals: Week 1:  PT Short Term Goal 1 (Week 1): = LTGs due to LOS PT Short Term Goal 1 - Progress (Week 1): Progressing toward goal Basic and furniture transfers and home ambulation LTGs downgraded due to pt's problems with L attention and memory for safety issues during transfers and ambulation.  Pt is physically capable of clearing bil buttocks over wheel of w/c during squat pivot transfer, but generally requires min assist to perform this.  Ambulation at home is focused on getting in/out of BR in order to take a shower, so distance was decreased.  He is physically capable of wt shifting sufficiently to take = steps and stabilize LLE, but he generally requires mod assist.  Skilled Therapeutic Interventions/Progress Updates:  Tx 1:  Pt donned L AFO with difficulty due to strap/buckle combination of Toe Off.  Transfer training focusing on forward wt shift, orientation of body, L wt bearing.  Bed> w/c squat pivot transfer with min assist in order to avoid wheel. Pt continues to demonstrated L inattention, impulsivity and decreased memory for problem solving how to improve transfers.  neuromuscular re-education via forced use, positioning, manual and VCS, demo for:  L trunk lengthening, R trunk shortening in R sidelying over bolster;  mobility and gait.  Scooting > R on mat focusing on hip elevation wt bearing L;  repetitive sit>< stand without use of UEs.  Gait with RW x 15' wearing with L AFO, with mod assist, focusing on longer L stance time, longer R step length, sequencing.  Pt needs to be able to ambulate about 6' from door of BR at his cousin's house to shower.     Therapy Documentation Precautions:  Precautions Precautions: Fall Precaution Comments: L knee chronic pain Restrictions Weight  Bearing Restrictions: No     Pain: Pain Assessment Pain Assessment: No/denies pain       See FIM for current functional status  Therapy/Group: Individual Therapy  Ryott Rafferty 02/05/2014, 12:44 PM

## 2014-02-05 NOTE — Progress Notes (Signed)
Physical Therapy Session Note  Patient Details  Name: [name] MRN: [id number] Date of Birth: Sep 12, 1950  Today's Date: 02/05/2014 Time: 0630-1601 Time Calculation (min): 25 min  Short Term Goals: Week 1:  PT Short Term Goal 1 (Week 1): = LTGs due to LOS  Skilled Therapeutic Interventions/Progress Updates:    Pt received seated in w/c; agreeable to therapy. Session focused on facilitating pt independence managing L AFO, negotiating stairs to enter home.  Trimmed ends of velcro straps diagonally to increase ease of threading velcro and used bright tape to increase visibility of velcro latch. Pt able to effectively manage velcro straps after modifications were made; however, pt demonstrated difficulty fitting L foot into shoe without assist. Transported pt to gym in w/c with total A for energy conservation. Negotiated 6 steps with 2 rails, forward-facing with step-to pattern and mod A, tactile cueing for lateral weight shifting, manual stabilization of L knee during stance. Verbal cueing focused on sequencing and slow movement. During descent, pt required manual placement of L foot on stairs x3 secondary to pt tendency to continue to descend despite L heel being caught on previous step. Pt reports ability to enter home through basement, which is 1 step without rails. Discussed with primary PT. Session ended in pt room, where pt was left seated in w/c with all needs within reach.  Therapy Documentation Precautions:  Precautions Precautions: Fall Precaution Comments: L knee chronic pain Restrictions Weight Bearing Restrictions: No Vital Signs: Therapy Vitals Temp: 98.1 F (36.7 C) Temp src: Oral Pulse Rate: 77 Resp: 16 BP: 132/84 mmHg Patient Position (if appropriate): Sitting Oxygen Therapy SpO2: 99 % O2 Device: None (Room air) Pain: Pain Assessment Pain Assessment: No/denies pain Locomotion : Ambulation Ambulation/Gait Assistance: 3: Mod assist   See FIM for current  functional status  Therapy/Group: Individual Therapy  Hobble, Malva Cogan 02/05/2014, 4:08 PM

## 2014-02-05 NOTE — Progress Notes (Signed)
6/29 0900 Pt. Demonstrated use of the incentive spirometer with a goal of 1000.  Pt. Continues to need reinforcement daily and frequency and technique of use.

## 2014-02-05 NOTE — Progress Notes (Signed)
63 y.o. 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 He was d/c five days ago and was awaiting mail order Rx of antiseizure medications and developed seizures X 2 on 01/21/14. He was admitted for workup due to seizures X 2 and FTT. He was hypotensive in ED and was treated with IVF bolus for acute renal failure with BUN/Cr- 39/2.81. Anemia acute on chronic drop, in Hgb to 6.5 was transfused with 2 units PRBC . Patient with villous adenoma excision 10/2013.  On NSAIDs at home for knee pain. No reports of hematochezia. Neurology recommended continuing Keppra at current dose and no formal consult needed  Subjective/Complaints: Difficulty donning AFO Eating 70-100% now! Hasn't been amb to BR yet- this is a goal No new c/os, remains motivated  Review of Systems - Negative except left side weak and numb,  Objective: Vital Signs: Blood pressure 137/83, pulse 72, temperature 98.2 F (36.8 C), temperature source Oral, resp. rate 16, height 5\' 8"  (1.727 m), weight 74.345 kg (163 lb 14.4 oz), SpO2 95.00%. No results found. Results for orders placed during the hospital encounter of 01/23/14 (from the past 72 hour(s))  CBC     Status: Abnormal   Collection Time    02/05/14  7:04 AM      Result Value Ref Range   WBC 7.2  4.0 - 10.5 K/uL   RBC 3.37 (*) 4.22 - 5.81 MIL/uL   Hemoglobin 8.9 (*) 13.0 - 17.0 g/dL   HCT 28.4 (*) 39.0 - 52.0 %   MCV 84.3  78.0 - 100.0 fL   MCH 26.4  26.0 - 34.0 pg   MCHC 31.3  30.0 - 36.0 g/dL   RDW 17.3 (*) 11.5 - 15.5 %   Platelets 267  150 - 400 K/uL     HEENT: edentulous Cardio: RRR and no murmurs Resp: CTA B/L and unlabored GI: BS positive and NT,ND Extremity:  Pulses positive and No Edema Skin:   Intact and Other healed G tube site Neuro: Alert/Oriented, Cranial Nerve II-XII normal,  Abnormal Sensory Reduced sensation to LT, LUE and LLE, Abnormal Motor 5/5 in BUE, 5/5 RLE, 4/5 L HF, KE, 2- L ankle DF and PF, Abnormal FMC Ataxic/ dec FMC and Dysarthric Musc/Skel:  Normal Gen NAD   Assessment/Plan: 1. Functional deficits secondary to deconditioning after recurrent seizures and GI bleed requiring transfusion which require 3+ hours per day of interdisciplinary therapy in a comprehensive inpatient rehab setting. Physiatrist is providing close team supervision and 24 hour management of active medical problems listed below. Physiatrist and rehab team continue to assess barriers to discharge/monitor patient progress toward functional and medical goals.  FIM: FIM - Bathing Bathing Steps Patient Completed: Chest;Right Arm;Left Arm;Abdomen;Front perineal area;Buttocks;Right upper leg;Left upper leg Bathing: 4: Steadying assist  FIM - Upper Body Dressing/Undressing Upper body dressing/undressing steps patient completed: Pull shirt around back of front closure shirt/dress;Button/unbutton shirt;Thread/unthread right sleeve of front closure shirt/dress;Thread/unthread left sleeve of front closure shirt/dress Upper body dressing/undressing: 5: Set-up assist to: Obtain clothing/put away FIM - Lower Body Dressing/Undressing Lower body dressing/undressing steps patient completed: Thread/unthread right underwear leg;Pull pants up/down;Thread/unthread left underwear leg;Pull underwear up/down;Fasten/unfasten pants;Thread/unthread right pants leg;Thread/unthread left pants leg Lower body dressing/undressing: 4: Steadying Assist  FIM - Toileting Toileting steps completed by patient: Performs perineal hygiene;Adjust clothing prior to toileting;Adjust clothing after toileting Toileting  Assistive Devices: Grab bar or rail for support Toileting: 0: Activity did not occur  FIM - Radio producer Devices: Elevated toilet seat;Grab bars;Walker Personnel officer:  0-Activity did not occur  FIM - Control and instrumentation engineer Devices: Arm rests;Orthosis Bed/Chair Transfer: 0: Activity did not occur  FIM - Locomotion: Wheelchair Distance: 150 Locomotion: Wheelchair: 1: Total Assistance/staff pushes wheelchair (Pt<25%) FIM - Locomotion: Ambulation Locomotion: Ambulation Assistive Devices: Administrator Ambulation/Gait Assistance: 2: Max assist Locomotion: Ambulation: 0: Activity did not occur  Comprehension Comprehension Mode: Auditory Comprehension: 6-Follows complex conversation/direction: With extra time/assistive device  Expression Expression Mode: Verbal Expression: 5-Expresses complex 90% of the time/cues < 10% of the time  Social Interaction Social Interaction: 6-Interacts appropriately with others with medication or extra time (anti-anxiety, antidepressant).  Problem Solving Problem Solving: 5-Solves complex 90% of the time/cues < 10% of the time  Memory Memory: 4-Recognizes or recalls 75 - 89% of the time/requires cueing 10 - 24% of the time  Medical Problem List and Plan:  1. Functional deficits secondary to Recurrent seizures, h/o CVA with left hemiparesis, deconditioning.  2. DVT Prophylaxis/Anticoagulation: SCD 3. Pain Management: Will add Voltaren gel qid. Tylenol prn for pain  4. Mood: No signs of distress. LCSW to follow for evaluation and support.  5. Neuropsych: This patient is capable of making decisions on his own behalf.  6. Anemia of chronic disease: Will continue iron supplement. Monitor for signs of bleeding.  7. Acute renal failure: Improved after IVF and d/c of HCTZ/Lisinopril. 8. Seizure disorder: will continue Keppra 1500 mg bid  9. COPD: stable on spiriva  10. H/o alcohol abuse: on folic acid and thiamine. Denies recent use, pt now aware ETOH lowers seizure potential 11. Hypokalemia: repleted 12. HTN: Will monitor every 8 hours. Blood pressures trending back up. Continue to hold HCTZ.  resumed lisinopril at lower dose will titrate upwards--follow pattern, last BMET ok   13. Constipation:increased Senna S  14.  GI bleed recent, stool still OB + as expected,  Hgb stable  LOS (Days) 13 A FACE TO FACE EVALUATION WAS PERFORMED  [name],Kodey E 02/05/2014, 9:12 AM

## 2014-02-06 ENCOUNTER — Inpatient Hospital Stay (HOSPITAL_COMMUNITY): Payer: Medicaid Other | Admitting: *Deleted

## 2014-02-06 ENCOUNTER — Inpatient Hospital Stay (HOSPITAL_COMMUNITY): Payer: Medicaid Other | Admitting: Occupational Therapy

## 2014-02-06 DIAGNOSIS — G8100 Flaccid hemiplegia affecting unspecified side: Secondary | ICD-10-CM

## 2014-02-06 DIAGNOSIS — R569 Unspecified convulsions: Secondary | ICD-10-CM

## 2014-02-06 DIAGNOSIS — J449 Chronic obstructive pulmonary disease, unspecified: Secondary | ICD-10-CM

## 2014-02-06 DIAGNOSIS — R5381 Other malaise: Secondary | ICD-10-CM

## 2014-02-06 NOTE — Progress Notes (Signed)
Note reviewed and accurately reflects treatment session.   

## 2014-02-06 NOTE — Progress Notes (Signed)
Social Work Patient ID: [name], male   DOB: 05-25-1951, 63 y.o.   MRN: [id number] Never heard back form Bernadette-Cousin/POA regarding coming in for family education.  Pt has also not heard from her. Will try to get her to come in tomorrow prior to discharge.  Numerous concerns regarding pt's care and possibly being left alone at home. He wants to try it and see how he does, but also has concerns about going home.  He is aware of the alternative options and doesn't want to pursue this At this time.

## 2014-02-06 NOTE — Progress Notes (Signed)
Occupational Therapy Session Note  Patient Details  Name: [name] MRN: [id number] Date of Birth: 03-06-1951  Today's Date: 02/06/2014 Time: 1500-1540 Time Calculation (min): 40 min  Short Term Goals: Week 2:  OT Short Term Goal 1 (Week 2): STG = LTG  Skilled Therapeutic Interventions/Progress Updates:  Patient resting in w/c upon arrival.  Engaged in w/c><drop arm commode transfers with focus on safety and limited physical as well as verbal assistance to maintain safety.  Patient performed 5 w/c><drop arm commode via squat pivot however required vcs at least 80% of the time and physical assist at least 50% of the time to remain safe.  Patient performs best when transferring to his right therefore attempted transferring w/c>drop arm> bed>w/c and patient did not require any physical assist however, he did require mod vcs for safety.  Reviewed concerns for patient's safety at home secondary to he reports that he will be home alone much of the day.  Patient agrees that he needs someone to be present any time he is up on his feet or performs any type of transfer.  Patient's cousin did not attend any OT sessions today for education-despite urging of SW.  Written instructions provided regarding recommendations for toilet transfer, sponge bath and dressing.  Therapy Documentation Precautions:  Precautions Precautions: Fall Precaution Comments: L knee chronic pain and weakness Restrictions Weight Bearing Restrictions: No Pain: No report of pain ADL: See FIM for current functional status  Therapy/Group: Individual Therapy  SHAFFER, CHRISTINA 02/06/2014, 5:38 PM

## 2014-02-06 NOTE — Progress Notes (Signed)
Occupational Therapy Session Note  Patient Details  Name: [name] MRN: [id number] Date of Birth: 07/09/1951  Today's Date: 02/06/2014 Time: 0902-1002 Time Calculation: 60 min   Short Term Goals: Week 2:  OT Short Term Goal 1 (Week 2): STG = LTG  Skilled Therapeutic Interventions/Progress Updates:   Pt seen for ADL retraining with focus on postural control, motor planning, command following, functional transfers, problem solving and balance. Pt received supine in bed. Completed supine>sit with min cues and extra time. Performed bathing and dressing sitting EOB with max multimodal cues for problem solving and command following with pt demonstrating decreased memory of techniques and difficulty motor planning. Pt restless and impulsive throughout session requiring max cues and external aid for sit<>stand using RW to perform peri hygiene and manipulate clothing over hips. Pt required significant extra time and max cues to problem solve donning left shoe today with pt demonstrating decreased postural control in sitting. Transferred to w/c with min assist and mod cues for positioning. Discussed home bathroom set up and safety concerns with pt.  Pt left sitting in w/c with all needs in reach.   Therapy Documentation Precautions:  Precautions Precautions: Fall Precaution Comments: L knee chronic pain Restrictions Weight Bearing Restrictions: No  Pain:  Pt reporting no pain at this time.   ADL:    See FIM for current functional status  Therapy/Group: Individual Therapy  Spake,Lauren Brooke 02/06/2014, 12:50 PM

## 2014-02-06 NOTE — Progress Notes (Signed)
Physical Therapy Discharge Summary  Patient Details  Name: [name] MRN: [id number] Date of Birth: Mar 17, 1951  Today's Date: 02/06/2014 Time:   Patient has met 12 of 12 long term goals due to improved activity tolerance, improved balance, improved postural control and functional use of  left upper extremity and left lower extremity.  Pt progress was somewhat limited by longstanding cognitive and physical impairments as well as decreased caregiver engagement in [place].  Patient to discharge at a wheelchair level Modified Independent and ambulatory short distances at [place] level.  Patient's care partner is able to provide part-time  physical and cognitive assistance at discharge, but has been notified of the recommendations for safety with mobility and hiring care for 24/7 assist. Maudry Diego works as Quar[employer] manager, but did not attend family ed as planned.  Therapists issued pt hand-outs describing mobility and gait up/down threshold to get into house, plus trunk stretching HEP.  Pt agreed to remaining at w/c level at d/c.  Recommendation:  Patient will benefit from ongoing skilled PT services in home health setting to continue to advance safe functional mobility, address ongoing impairments in motor control, weakness, balance, and minimize fall risk.  Equipment: No equipment provided - pt has RW and WC  Reasons for discharge: treatment goals met and discharge from hospital  Patient/family agrees with progress made and goals achieved: Yes  PT Discharge Precautions/Restrictions Precautions Precautions: Fall Precaution Comments: L knee chronic pain and weakness Restrictions Weight Bearing Restrictions: No Pain - none   Vision/Perception  Vision - History Baseline Vision: No visual deficits Patient Visual Report: No change from baseline Vision - Assessment Eye Alignment: Within Functional Limits Tracking/Visual Pursuits: Able to track stimulus in all quads without  difficulty Perception Perception: Within Functional Limits Praxis Praxis: Intact  Cognition Overall Cognitive Status: History of cognitive impairments - at baseline Arousal/Alertness: Awake/alert Orientation Level: Oriented X4 Attention: Sustained Sustained Attention: Impaired Sustained Attention Impairment: Verbal basic Memory: Appears intact Awareness: Impaired Awareness Impairment: Emergent impairment;Anticipatory impairment Problem Solving: Impaired Problem Solving Impairment: Functional basic Safety/Judgment: Impaired Sensation Sensation Light Touch: Impaired Detail ((diminished R lower leg and foot; able to localize)) Light Touch Impaired Details: Impaired LUE Proprioception: Impaired Detail Proprioception Impaired Details: Absent LLE Coordination Gross Motor Movements are Fluid and Coordinated: No Fine Motor Movements are Fluid and Coordinated: No Coordination and Movement Description: Decreased accuracy and timing Heel Shin Test: impaired speed, accuracy, excursion LLE Motor  Motor Motor: Hemiplegia;Motor impersistence;Abnormal tone Motor - Skilled Clinical Observations: Impaired motor control due to imbalanced muscle activation Motor - Discharge Observations: Impaired LE activation with decreased L-sided weight shifting   Mobility Bed Mobility Bed Mobility: Supine to Sit;Sit to Supine Supine to Sit: 6: Modified independent (Device/Increase time) Sit to Supine: 6: Modified independent (Device/Increase time) Transfers Transfers: Yes Squat Pivot Transfers: 4: Min Risk manager Details (indicate cue type and reason): Min A to complete turn Locomotion  Ambulation Ambulation: Yes Ambulation/Gait Assistance:Min  A x25' with RW and max cues for weight-shifting Stairs / Additional Locomotion Stairs: Yes Stairs Assistance: 4: Min assist Stairs Assistance Details (indicate cue type and reason): Min A for steadying and cues for sequence, placement,  technique. Stair Management Technique: One rail Left;Step to pattern;Sideways Number of Stairs: 5 Height of Stairs: 6 Curb: 4: Min Chemical engineer: Yes Wheelchair Assistance: 5: Careers information officer: Both upper extremities Wheelchair Parts Management: Supervision/cueing Distance: 150  Trunk/Postural Assessment  Cervical Assessment Cervical Assessment: Within Water engineer Thoracic Assessment:  Within Functional Limits Lumbar Assessment Lumbar Assessment: Decreased lengthening on L side  Postural Control Postural Control: Within Functional Limits  Balance Balance Balance Assessed: No Static Sitting Balance Static Sitting - Level of Assistance: 6: Modified independent (Device/Increase time) Dynamic Sitting Balance Sitting balance - Comments: Mod I  Static Standing Balance Static Standing - Level of Assistance: 5: Stand by assistance Dynamic Standing Balance Dynamic Standing - Balance Support: Bilateral upper extremity supported Dynamic Standing - Level of Assistance: 4: Min assist Dynamic Standing - Balance Activities: Reaching for objects;Forward lean/weight shifting;Lateral lean/weight shifting Extremity Assessment  RUE Assessment RUE Assessment: Within Functional Limits LUE Assessment LUE Assessment: Exceptions to [facility] (Decreased grasp release) RLE Assessment RLE Assessment: Within Functional Limits LLE Assessment LLE Assessment: Exceptions to [facility] LLE Strength LLE Overall Strength Comments: grossly in sitting, hip flexion 4-/5 ; knee ext 3-/5, knee flexion 1/5, ankle DF 3-/5  See FIM for current functional status  KAMPEN, COLE M 02/06/2014, 1:25 PM

## 2014-02-06 NOTE — Progress Notes (Signed)
Physical Therapy Session Note  Patient Details  Name: [name] MRN: [id number] Date of Birth: 05-08-1951  Today's Date: 02/06/2014 Time:10:03-11:03 (83min) and  1405-1450 Time Calculation (min): 45 min  Short Term Goals: Week 2:  PT Short Term Goal 1 (Week 2): =STG due to LOS  Skilled Therapeutic Interventions/Progress Updates:  First tx focused on functional mobility, transfers, gait with RW, and stairs/curb step. Pt up in [facility], propelled to gym with cues for safety navigating busy environment. Discussed healthy lifestyle changes and pt reports that he'll quit smoking and drinking "eventually." Pt educated on risks and functional implications.   Performed stairs 1x3 with min A, but pt needing to use bathroom, so transferred back for toileting (see FIM).  Resumed stair training with demo and pt encouraged to verbalize sequence and technique for L rail ascending. Pt needed step-by-step cues for safe foot placement. Performed curb step with same level of cues and min A for steadying. Pt's decreased weight shifting limits safety with mobility.   Performed gait in controlled setting x25' with up to Mod A for weight shifting to ensure L foor swing. Without shifting, at min A leve, pt resumes old stutter-step habits, but is able to complete distance.  ____________________________________________   Second tx focused on mobility in apartment setting, gait, car transfers, and discussion of floor transfer. Pt has concerns about going home tomorrow, including stairs and getting to appointments. PT scheduled for the morning in hopes to connect with his cousin, who will be his caregiver, but has not been returning SW calls to schedule time. Therapists have written reminders and instructions for safe mobility for pt, but he continues to perform transfers and gait with little change in technique since eval, likely due to longstanding habits of movement. PT demonstrated floor transfer, but pt interested in  attempting, verbalizing understanding of technique.   Pt up in [facility], encouraged to propelself to gym tomorrow. Pt continues to need cues for LLE safety during WC mobility as well as min cues for transfer set-up.   Performed furniture transfers with min A for safely completing transfer, Mod I for bed mobility, and Mod I for WC propulsion in apartment. Performed gait in apartment x12' with Min A overall and cues for technique. Without step-by step cues and manual facilitation, pt continues to walk with skipping RLE along, leaving LLE behind. Cousin will need to be educated and trained. Performed sidelying trunk stretch for L-sided lengthening x2 min on pillow roll, handout provided.   Performed car transfer with Min A and cues for hand and foot placement as well as technique.   Performed gait in hall with Min A for weight shifting and encouragement to complete 25' with RW.  Pt transported back to room with all needs in reach.        Therapy Documentation Precautions:  Precautions Precautions: Fall Precaution Comments: L knee chronic pain and weakness Restrictions Weight Bearing Restrictions: No    Pain: none   Mobility: Bed Mobility Bed Mobility: Supine to Sit;Sit to Supine Supine to Sit: 6: Modified independent (Device/Increase time) Sit to Supine: 6: Modified independent (Device/Increase time) Transfers Transfers: Yes Squat Pivot Transfers: 4: Min Risk manager Details (indicate cue type and reason): Min A to complete turn Locomotion : Ambulation Ambulation: Yes Ambulation/Gait Assistance: 4: Min assist Stairs / Additional Locomotion Stairs: Yes Stairs Assistance: 4: Min assist Stairs Assistance Details (indicate cue type and reason): Min A for steadying and cues for sequence, placement, technique. Stair Management Technique:  One rail Left;Step to pattern;Sideways Number of Stairs: 5 Height of Stairs: 6 Curb: 4: Min Copywriter, advertising: Yes Wheelchair Assistance: 5: Careers information officer: Both upper extremities Wheelchair Parts Management: Supervision/cueing Distance: 150  Trunk/Postural Assessment : Cervical Assessment Cervical Assessment: Within Scientist, physiological Assessment: Within Functional Limits Lumbar Assessment Lumbar Assessment: Within Functional Limits Postural Control Postural Control: Within Functional Limits  Balance: Balance Balance Assessed: No Static Sitting Balance Static Sitting - Level of Assistance: 6: Modified independent (Device/Increase time) Dynamic Sitting Balance Dynamic Sitting - Level of Assistance: 5: Stand by assistance Sitting balance - Comments: Mod I  Static Standing Balance Static Standing - Level of Assistance: 5: Stand by assistance Dynamic Standing Balance Dynamic Standing - Balance Support: Bilateral upper extremity supported Dynamic Standing - Level of Assistance: 4: Min assist Dynamic Standing - Balance Activities: Reaching for objects;Forward lean/weight shifting;Lateral lean/weight shifting  See FIM for current functional status  Therapy/Group: Individual Therapy Kennieth Rad, PT, DPT  02/06/2014, 3:52 PM

## 2014-02-06 NOTE — Progress Notes (Signed)
Occupational Therapy Discharge Summary  Patient Details  Name: [name] MRN: [id number] Date of Birth: Dec 21, 1950  Today's Date: 02/06/2014  Patient has met 10 of 10 long term goals due to improved activity tolerance, improved balance, postural control, ability to compensate for deficits, functional use of  LEFT upper and LEFT lower extremity, improved attention, improved awareness and improved coordination.  Patient to discharge at overall min assist-supervision level.  Patient's care partner unavailable to provide the necessary physical and cognitive assistance at discharge.    Reasons goals not met: N/A   Recommendation:  Patient will benefit from ongoing skilled OT services in home health setting to continue to advance functional skills in the area of BADL and Reduce care partner burden.  Equipment: drop arm commode   Reasons for discharge: treatment goals met and discharge from hospital  Patient/family agrees with progress made and goals achieved: Yes  OT Discharge Precautions/Restrictions  Precautions Precautions: Fall Precaution Comments: L knee chronic pain and weakness Restrictions Weight Bearing Restrictions: No    Pain  Pt reporting no pain at this time.  ADL ADL ADL Comments: See FIM Vision/Perception  Vision- History Baseline Vision/History: No visual deficits;Wears glasses Wears Glasses: Reading only Vision- Assessment Vision Assessment?: No apparent visual deficits Eye Alignment: Within Functional Limits Tracking/Visual Pursuits: Able to track stimulus in all quads without difficulty Perception Perception: Within Functional Limits Praxis Praxis: Intact Praxis-Other Comments: Pt difficulty motor planning movements for bathing, dressing and functional ambulation (all transitional movements)  Cognition Overall Cognitive Status: Impaired/Different from baseline Arousal/Alertness: Awake/alert Orientation Level: Oriented X4 Attention:  Sustained Sustained Attention: Impaired Sustained Attention Impairment: Verbal basic;Functional basic Memory: Impaired Memory Impairment: Decreased short term memory;Storage deficit Decreased Short Term Memory: Verbal basic;Functional basic Awareness: Impaired Awareness Impairment: Emergent impairment;Anticipatory impairment Problem Solving: Impaired Problem Solving Impairment: Functional basic;Verbal basic Executive Function: Sequencing;Organizing;Self Monitoring;Self Correcting Sequencing: Impaired Sequencing Impairment: Verbal basic;Functional basic Organizing: Impaired Organizing Impairment: Verbal basic;Functional basic Self Monitoring: Impaired Self Monitoring Impairment: Verbal basic;Functional basic Self Correcting: Impaired Self Correcting Impairment: Verbal basic;Functional basic Behaviors: Restless;Impulsive Safety/Judgment: Impaired Sensation Sensation Light Touch: Impaired Detail Light Touch Impaired Details: Impaired LUE Proprioception: Impaired Detail Proprioception Impaired Details: Absent LLE Coordination Gross Motor Movements are Fluid and Coordinated: No Fine Motor Movements are Fluid and Coordinated: No Coordination and Movement Description: Decreased accuracy and timing Heel Shin Test: impaired speed, accuracy, excursion LLE Motor  Motor Motor: Hemiplegia;Abnormal tone;Motor apraxia Motor - Skilled Clinical Observations: Impaired motor control due to imbalanced muscle activation Motor - Discharge Observations: Impaired LE activation with decreased L-sided weight shifting  Mobility  Bed Mobility Bed Mobility: Supine to Sit;Sit to Supine Supine to Sit: 6: Modified independent (Device/Increase time) Sit to Supine: 6: Modified independent (Device/Increase time)  Trunk/Postural Assessment  Cervical Assessment Cervical Assessment: Within Functional Limits Thoracic Assessment Thoracic Assessment: Within Functional Limits Lumbar Assessment Lumbar  Assessment: Within Functional Limits Postural Control Postural Control: Within Functional Limits  Balance Balance Balance Assessed: No Static Sitting Balance Static Sitting - Level of Assistance: 6: Modified independent (Device/Increase time) Dynamic Sitting Balance Dynamic Sitting - Level of Assistance: 5: Stand by assistance Sitting balance - Comments: Mod I  Static Standing Balance Static Standing - Level of Assistance: 5: Stand by assistance Dynamic Standing Balance Dynamic Standing - Balance Support: Bilateral upper extremity supported Dynamic Standing - Level of Assistance: 4: Min assist Dynamic Standing - Balance Activities: Reaching for objects;Forward lean/weight shifting;Lateral lean/weight shifting Extremity/Trunk Assessment RUE Assessment RUE Assessment: Within Functional Limits LUE Assessment LUE Assessment:  Within Functional Limits  See FIM for current functional status  Spake,Lauren Brooke 02/06/2014, 2:25 PM

## 2014-02-06 NOTE — Progress Notes (Signed)
Subjective/Complaints: Slept last noc, worried about D/C  Review of Systems - Negative except left side weak and numb,  Objective: Vital Signs: Blood pressure 137/90, pulse 81, temperature 97.1 F (36.2 C), temperature source Oral, resp. rate 18, height 5\' 8"  (1.727 m), weight 74.345 kg (163 lb 14.4 oz), SpO2 97.00%. No results found. Results for orders placed during the hospital encounter of 01/23/14 (from the past 72 hour(s))  CBC     Status: Abnormal   Collection Time    02/05/14  7:04 AM      Result Value Ref Range   WBC 7.2  4.0 - 10.5 K/uL   RBC 3.37 (*) 4.22 - 5.81 MIL/uL   Hemoglobin 8.9 (*) 13.0 - 17.0 g/dL   HCT 28.4 (*) 39.0 - 52.0 %   MCV 84.3  78.0 - 100.0 fL   MCH 26.4  26.0 - 34.0 pg   MCHC 31.3  30.0 - 36.0 g/dL   RDW 17.3 (*) 11.5 - 15.5 %   Platelets 267  150 - 400 K/uL  OCCULT BLOOD X 1 CARD TO LAB, STOOL     Status: Abnormal   Collection Time    02/05/14 11:15 AM      Result Value Ref Range   Fecal Occult Bld POSITIVE (*) NEGATIVE     HEENT: edentulous Cardio: RRR and no murmurs Resp: CTA B/L and unlabored GI: BS positive and NT,ND Extremity:  Pulses positive and No Edema Skin:   Intact  Neuro: Alert/Oriented, Cranial Nerve II-XII normal, Abnormal Sensory Reduced sensation to LT, LUE and LLE, Abnormal Motor 5/5 in BUE, 5/5 RLE, 4/5 L HF, KE, 2- L ankle DF and PF, Abnormal FMC Ataxic/ dec FMC and Dysarthric Musc/Skel:  Normal Gen NAD   Assessment/Plan: 1. Functional deficits secondary to deconditioning after recurrent seizures and GI bleed requiring transfusion which require 3+ hours per day of interdisciplinary therapy in a comprehensive inpatient rehab setting. Physiatrist is providing close team supervision and 24 hour management of active medical problems listed below. Physiatrist and rehab team continue to assess barriers to discharge/monitor patient progress toward functional and medical goals. Ready for D/C in am, WC level FIM: FIM -  Bathing Bathing Steps Patient Completed: Chest;Right Arm;Left Arm;Abdomen;Front perineal area;Buttocks;Right upper leg;Left upper leg;Right lower leg (including foot);Left lower leg (including foot) Bathing: 5: Supervision: Safety issues/verbal cues  FIM - Upper Body Dressing/Undressing Upper body dressing/undressing steps patient completed: Thread/unthread right sleeve of pullover shirt/dresss;Thread/unthread left sleeve of pullover shirt/dress;Put head through opening of pull over shirt/dress;Pull shirt over trunk Upper body dressing/undressing: 6: More than reasonable amount of time FIM - Lower Body Dressing/Undressing Lower body dressing/undressing steps patient completed: Thread/unthread right underwear leg;Pull pants up/down;Thread/unthread left underwear leg;Pull underwear up/down;Fasten/unfasten pants;Thread/unthread right pants leg;Thread/unthread left pants leg;Don/Doff right sock;Don/Doff left sock Lower body dressing/undressing: 5: Supervision: Safety issues/verbal cues  FIM - Toileting Toileting steps completed by patient: Performs perineal hygiene;Adjust clothing prior to toileting;Adjust clothing after toileting Toileting Assistive Devices: Grab bar or rail for support Toileting: 5: Supervision: Safety issues/verbal cues  FIM - Radio producer Devices: Elevated toilet seat;Grab bars Toilet Transfers: 4-To toilet/BSC: Min A (steadying Pt. > 75%);4-From toilet/BSC: Min A (steadying Pt. > 75%)  FIM - Bed/Chair Transfer Bed/Chair Transfer Assistive Devices: Arm rests;Orthosis Bed/Chair Transfer: 4: Bed > Chair or W/C: Min A (steadying Pt. > 75%);4: Chair or W/C > Bed: Min A (steadying Pt. > 75%)  FIM - Locomotion: Wheelchair Distance: 150 Locomotion: Wheelchair: 1: Total Assistance/staff  pushes wheelchair (Pt<25%) FIM - Locomotion: Ambulation Locomotion: Ambulation Assistive Devices: Walker - Rolling Ambulation/Gait Assistance: 3: Mod  assist Locomotion: Ambulation: 0: Activity did not occur  Comprehension Comprehension Mode: Auditory Comprehension: 5-Follows basic conversation/direction: With no assist  Expression Expression Mode: Verbal Expression: 4-Expresses basic 75 - 89% of the time/requires cueing 10 - 24% of the time. Needs helper to occlude trach/needs to repeat words.  Social Interaction Social Interaction: 6-Interacts appropriately with others with medication or extra time (anti-anxiety, antidepressant).  Problem Solving Problem Solving: 5-Solves basic problems: With no assist  Memory Memory: 5-Requires cues to use assistive device  Medical Problem List and Plan:  1. Functional deficits secondary to Recurrent seizures, h/o CVA with left hemiparesis, deconditioning.  2. DVT Prophylaxis/Anticoagulation: SCD 3. Pain Management: Will add Voltaren gel qid. Tylenol prn for pain  4. Mood: No signs of distress. LCSW to follow for evaluation and support.  5. Neuropsych: This patient is capable of making decisions on his own behalf.  6. Anemia of chronic disease: Will continue iron supplement. Monitor for signs of bleeding.  7. Acute renal failure: Improved after IVF and d/c of HCTZ/Lisinopril. 8. Seizure disorder: will continue Keppra 1500 mg bid  9. COPD: stable on spiriva  10. H/o alcohol abuse: on folic acid and thiamine. Denies recent use, pt now aware ETOH lowers seizure potential 11. Hypokalemia: repleted 12. HTN: Will monitor every 8 hours. Blood pressures trending back up. Continue to hold HCTZ. resumed lisinopril at lower dose will titrate upwards--follow pattern, last BMET ok   13. Constipation:increased Senna S  14.  GI bleed recent, stool still OB + as expected,  Hgb stable  LOS (Days) 14 A FACE TO FACE EVALUATION WAS PERFORMED  [name],Aidric E 02/06/2014, 7:28 AM

## 2014-02-07 ENCOUNTER — Inpatient Hospital Stay (HOSPITAL_COMMUNITY): Payer: Medicaid Other

## 2014-02-07 DIAGNOSIS — R5381 Other malaise: Secondary | ICD-10-CM | POA: Diagnosis present

## 2014-02-07 MED ORDER — THIAMINE HCL 100 MG PO TABS: 100.0000 mg | ORAL_TABLET | Freq: Every day | ORAL | Status: DC

## 2014-02-07 MED ORDER — FERROUS SULFATE 325 (65 FE) MG PO TABS
325.0000 mg | ORAL_TABLET | Freq: Two times a day (BID) | ORAL | Status: DC
Start: 2014-02-07 — End: 2014-06-03

## 2014-02-07 MED ORDER — PANTOPRAZOLE SODIUM 40 MG PO TBEC: 40.0000 mg | DELAYED_RELEASE_TABLET | Freq: Every day | ORAL | Status: DC

## 2014-02-07 MED ORDER — SENNOSIDES-DOCUSATE SODIUM 8.6-50 MG PO TABS: 2.0000 | ORAL_TABLET | Freq: Two times a day (BID) | ORAL | Status: DC

## 2014-02-07 MED ORDER — LISINOPRIL 10 MG PO TABS: 5.0000 mg | ORAL_TABLET | Freq: Two times a day (BID) | ORAL | Status: DC

## 2014-02-07 MED ORDER — ACETAMINOPHEN 325 MG PO TABS
325.0000 mg | ORAL_TABLET | ORAL | Status: DC | PRN
Start: 2014-02-07 — End: 2014-06-03

## 2014-02-07 MED ORDER — FOLIC ACID 1 MG PO TABS: 1.0000 mg | ORAL_TABLET | Freq: Every day | ORAL | Status: DC

## 2014-02-07 MED ORDER — DICLOFENAC SODIUM 1 % TD GEL
2.0000 g | Freq: Four times a day (QID) | TRANSDERMAL | Status: DC
Start: 2014-02-07 — End: 2014-06-03

## 2014-02-07 NOTE — Progress Notes (Signed)
Subjective/Complaints: Pt without new issues, looking fwd to d/c  Review of Systems - Negative except left side weak and numb,  Objective: Vital Signs: Blood pressure 140/79, pulse 76, temperature 97.4 F (36.3 C), temperature source Oral, resp. rate 18, height 5\' 8"  (1.727 m), weight 74.345 kg (163 lb 14.4 oz), SpO2 97.00%. No results found. Results for orders placed during the hospital encounter of 01/23/14 (from the past 72 hour(s))  CBC     Status: Abnormal   Collection Time    02/05/14  7:04 AM      Result Value Ref Range   WBC 7.2  4.0 - 10.5 K/uL   RBC 3.37 (*) 4.22 - 5.81 MIL/uL   Hemoglobin 8.9 (*) 13.0 - 17.0 g/dL   HCT 28.4 (*) 39.0 - 52.0 %   MCV 84.3  78.0 - 100.0 fL   MCH 26.4  26.0 - 34.0 pg   MCHC 31.3  30.0 - 36.0 g/dL   RDW 17.3 (*) 11.5 - 15.5 %   Platelets 267  150 - 400 K/uL  OCCULT BLOOD X 1 CARD TO LAB, STOOL     Status: Abnormal   Collection Time    02/05/14 11:15 AM      Result Value Ref Range   Fecal Occult Bld POSITIVE (*) NEGATIVE     HEENT: edentulous Cardio: RRR and no murmurs Resp: CTA B/L and unlabored GI: BS positive and NT,ND Extremity:  Pulses positive and No Edema Skin:   Intact  Neuro: Alert/Oriented, Cranial Nerve II-XII normal, Abnormal Sensory Reduced sensation to LT, LUE and LLE, Abnormal Motor 5/5 in BUE, 5/5 RLE, 4/5 L HF, KE, 2- L ankle DF and PF, Abnormal FMC Ataxic/ dec FMC and Dysarthric Musc/Skel:  Normal Gen NAD   Assessment/Plan: 1. Functional deficits secondary to deconditioning after recurrent seizures and GI bleed requiring transfusion  Stable for D/C today F/u PCP Dr Percell Belt in 1-2 weeks F/u PM&R 36 weeks See D/C summary See D/C instructions FIM: FIM - Bathing Bathing Steps Patient Completed: Chest;Right Arm;Left Arm;Abdomen;Front perineal area;Buttocks;Right upper leg;Left upper leg;Right lower leg (including foot);Left lower leg (including foot) Bathing: 5: Supervision: Safety issues/verbal cues  FIM  - Upper Body Dressing/Undressing Upper body dressing/undressing steps patient completed: Thread/unthread right sleeve of pullover shirt/dresss;Thread/unthread left sleeve of pullover shirt/dress;Put head through opening of pull over shirt/dress;Pull shirt over trunk Upper body dressing/undressing: 5: Supervision: Safety issues/verbal cues FIM - Lower Body Dressing/Undressing Lower body dressing/undressing steps patient completed: Thread/unthread right underwear leg;Pull pants up/down;Thread/unthread left underwear leg;Pull underwear up/down;Fasten/unfasten pants;Thread/unthread right pants leg;Thread/unthread left pants leg;Don/Doff right sock;Don/Doff left sock;Don/Doff right shoe;Don/Doff left shoe;Fasten/unfasten right shoe;Fasten/unfasten left shoe Lower body dressing/undressing: 5: Supervision: Safety issues/verbal cues  FIM - Toileting Toileting steps completed by patient: Performs perineal hygiene;Adjust clothing prior to toileting;Adjust clothing after toileting Toileting Assistive Devices: Grab bar or rail for support Toileting: 5: Supervision: Safety issues/verbal cues  FIM - Radio producer Devices: Bedside commode (drop arm) Toilet Transfers: 4-To toilet/BSC: Min A (steadying Pt. > 75%);4-From toilet/BSC: Min A (steadying Pt. > 75%)  FIM - Bed/Chair Transfer Bed/Chair Transfer Assistive Devices: Arm rests;Orthosis Bed/Chair Transfer: 6: Supine > Sit: No assist;6: Sit > Supine: No assist;4: Bed > Chair or W/C: Min A (steadying Pt. > 75%);4: Chair or W/C > Bed: Min A (steadying Pt. > 75%)  FIM - Locomotion: Wheelchair Distance: 150 Locomotion: Wheelchair: 5: Travels 150 ft or more: maneuvers on rugs and over door sills with supervision, cueing or  coaxing FIM - Locomotion: Ambulation Locomotion: Ambulation Assistive Devices: Orthosis;Walker - Rolling Ambulation/Gait Assistance: 4: Min assist Locomotion: Ambulation: 1: Travels less than 50 ft with minimal  assistance (Pt.>75%)  Comprehension Comprehension Mode: Auditory Comprehension: 5-Follows basic conversation/direction: With no assist  Expression Expression Mode: Verbal Expression: 4-Expresses basic 75 - 89% of the time/requires cueing 10 - 24% of the time. Needs helper to occlude trach/needs to repeat words.  Social Interaction Social Interaction: 6-Interacts appropriately with others with medication or extra time (anti-anxiety, antidepressant).  Problem Solving Problem Solving: 5-Solves basic problems: With no assist  Memory Memory: 5-Requires cues to use assistive device  Medical Problem List and Plan:  1. Functional deficits secondary to Recurrent seizures, h/o CVA with left hemiparesis, deconditioning.  2. DVT Prophylaxis/Anticoagulation: SCD 3. Pain Management: Will add Voltaren gel qid. Tylenol prn for pain  4. Mood: No signs of distress. LCSW to follow for evaluation and support.  5. Neuropsych: This patient is capable of making decisions on his own behalf.  6. Anemia of chronic disease: Will continue iron supplement. Monitor for signs of bleeding.  7. Acute renal failure: Improved after IVF and d/c of HCTZ/Lisinopril. 8. Seizure disorder: will continue Keppra 1500 mg bid  9. COPD: stable on spiriva  10. H/o alcohol abuse: on folic acid and thiamine. Denies recent use, pt now aware ETOH lowers seizure potential 11. Hypokalemia: repleted 12. HTN: Will monitor every 8 hours. Blood pressures trending back up. Continue to hold HCTZ. resumed lisinopril at lower dose will titrate upwards--follow pattern, last BMET ok   13. Constipation:increased Senna S  14.  GI bleed recent,  Hgb stable  LOS (Days) 15 A FACE TO FACE EVALUATION WAS PERFORMED  [name],[name] 02/07/2014, 7:48 AM

## 2014-02-07 NOTE — Progress Notes (Signed)
Social Work Patient ID: [name], male   DOB: 07-04-1951, 63 y.o.   MRN: [id number] Met with [name] when here to discuss concerns and pt's need for 24 hr care and how he flucuates in his level of assistance.  She reports he will have 4 hours of care form PCS and then he will need to stay in his wheelchair.  She is aware the team recommendation is 24 hr physical care.  Will place pt on the CAP waiting list.  Pt continues to want to go home with her, being aware he will not have 24 hr care.  Pt discharging with [name].

## 2014-02-07 NOTE — Progress Notes (Signed)
Patient and caregiver received discharge instructions from [facility], PA-C. Patient discharged to home with caregiver and belongings.

## 2014-02-07 NOTE — Progress Notes (Signed)
This note has been reviewed and this clinician agrees with information provided.

## 2014-02-07 NOTE — Discharge Summary (Signed)
Physician Discharge Summary  Patient ID: [name] MRN: [id number] DOB/AGE: 1951-01-10 63 y.o.  Admit date: 01/23/2014 Discharge date: 02/07/2014  Discharge Diagnoses:  Principal Problem:   Seizures Active Problems:   Hypertension   Left-sided weakness, residual s/p cva   Anemia, chronic disease   Physical deconditioning   Discharged Condition: Stable.   Significant Diagnostic Studies: No results found.  Labs:  Basic Metabolic Panel:  Recent Labs Lab 02/01/14 0450  NA 140  K 3.9  CL 103  CO2 26  GLUCOSE 94  BUN 11  CREATININE 0.90  CALCIUM 9.6    CBC: CBC Latest Ref Rng 02/05/2014 01/24/2014 01/23/2014  WBC 4.0 - 10.5 K/uL 7.2 8.0 7.3  Hemoglobin 13.0 - 17.0 g/dL 8.9(L) 9.0(L) 8.2(L)  Hematocrit 39.0 - 52.0 % 28.4(L) 28.3(L) 24.9(L)  Platelets 150 - 400 K/uL 267 281 274     CBG: No results found for this basename: GLUCAP,  in the last 168 hours  Brief HPI:   [name] is a 63 y.o. male with h/o R-CVA with left hemiparesis (CIR-'14), recent right frontal and parietal CVA 10/2013, recurrent seizures who was recently d/c from SNF. He was awaiting mail order Rx of antiseizure medications and developed seizures X 2 on 01/21/14. He was admitted for workup due to seizures X 2 and FTT. He was hypotensive in ED and was treated with IVF bolus for acute renal failure with BUN/Cr- 39/2.81. He required transfusion for acute on chronic anemia in Hgb to 6.5 and patient reported recent use of NSAIDs due to knee pain.  Neurology recommended continuing Keppra at current dose and no formal consult needed. PT evaluation done past admission and CIR recommended due to balance deficits and left knee instability.     Hospital Course: EDVARDO Pittman was admitted to rehab 01/23/2014 for inpatient therapies to consist of PT, ST and OT at least three hours five days a week. Past admission physiatrist, therapy team and rehab RN have worked together to provide customized collaborative  inpatient rehab. Blood pressures have been monitored on bid basis and lisinopril dose was changed to 5 mg to bid to prevent orthostatic changes. Renal failure has resolved and HCTZ was resumed.  He was started on bowel program to help with constipation and stool guaiacs X 2 have been positive. H/H has been monitored and is stable. He is to follow up with GI for further workup as indicated. Left knee pain has been treated with tylenol prn as well as Voltaren gel qid. He has been seizure free during this stay. Patient and family were educated alcohol avoidance past discharge. Patient continues to have balance deficits due to left hemiparesis as well as problems with sequencing and max cues for safety. Family did not show up for family education sessions and safety concerns were addressed with patient who has declined SNF. He will continue to receive HHPT, HHOT, HHRN as well as SW by [place] for follow up past discharge.     Rehab course: During patient's stay in rehab weekly team conferences were held to monitor patient's progress, set goals and discuss barriers to discharge. He has had improvement in balance, postural control, attention, awareness as well as coordination. He requires supervision for bathing and dressing tasks. He requires min assist for all transfers and mobility. He is able to ambulate 25 feet and navigate one stair with min assist. 24 hours supervision/assistance recommended past discharge.    Disposition: 01-Home or Self Care  Diet: Soft  foods.   Special Instructions: 1. No Alcohol.     Medication List         acetaminophen 325 MG tablet  Commonly known as:  TYLENOL  Take 1-2 tablets (325-650 mg total) by mouth every 4 (four) hours as needed for mild pain.     albuterol 108 (90 BASE) MCG/ACT inhaler  Commonly known as:  PROVENTIL HFA;VENTOLIN HFA  Inhale 2 puffs into the lungs every 6 (six) hours as needed for wheezing.     atorvastatin 80 MG tablet  Commonly  known as:  LIPITOR  Take 80 mg by mouth daily.     chlorhexidine 0.12 % solution  Commonly known as:  PERIDEX  Use as directed 15 mLs in the mouth or throat 2 (two) times daily. Use after breakfast and at bedtime:  Rinse and spit out excess, do not swallow     clopidogrel 75 MG tablet  Commonly known as:  PLAVIX  Take 1 tablet (75 mg total) by mouth daily with breakfast.     diclofenac sodium 1 % Gel  Commonly known as:  VOLTAREN  Apply 2 g topically 4 (four) times daily. To left knee     DSS 100 MG Caps  Take 100 mg by mouth daily.     ferrous sulfate 325 (65 FE) MG tablet  Take 1 tablet (325 mg total) by mouth 2 (two) times daily with a meal.     folic acid 1 MG tablet  Commonly known as:  FOLVITE  Take 1 tablet (1 mg total) by mouth daily.     hydrochlorothiazide 25 MG tablet  Commonly known as:  HYDRODIURIL  Take 1 tablet (25 mg total) by mouth daily.     levETIRAcetam 750 MG tablet  Commonly known as:  KEPPRA  Take 2 tablets (1,500 mg total) by mouth 2 (two) times daily.     lisinopril 10 MG tablet  Commonly known as:  PRINIVIL,ZESTRIL  Take 0.5 tablets (5 mg total) by mouth 2 (two) times daily.     multivitamin with minerals Tabs tablet  Take 1 tablet by mouth daily.     pantoprazole 40 MG tablet  Commonly known as:  PROTONIX  Take 1 tablet (40 mg total) by mouth daily.     senna-docusate 8.6-50 MG per tablet  Commonly known as:  Senokot-S  Take 2 tablets by mouth 2 (two) times daily. For constipation     thiamine 100 MG tablet  Take 1 tablet (100 mg total) by mouth daily.     tiotropium 18 MCG inhalation capsule  Commonly known as:  SPIRIVA HANDIHALER  Place 1 capsule (18 mcg total) into inhaler and inhale daily.       Follow-up Information   Follow up with Charlett Blake, MD On 03/27/2014. (Appointment at 10:45.  Be there at 10:15)    Specialty:  Physical Medicine and Rehabilitation   Contact information:   [place]  [place] [address] [phone]       Follow up with Luanne Bras, MD On 02/20/2014. (APPT @ 8;45 AM)    Specialty:  Internal Medicine   Contact information:   [place] [address] [phone]       Call Jerene Bears, MD. (for follow up on blood in stool. )    Specialty:  Gastroenterology   Contact information:   [address] [place] [address] [phone]       Follow up with Teena Dunk  F, DDS On 02/13/2014. (Patient to return for dental appt. at [facility] on 02/13/14 at 11 AM)    Specialty:  Dentistry   Contact information:   [address] [place] [address] [phone]       Signed: Bary Leriche 02/07/2014, 5:36 PM

## 2014-02-07 NOTE — Progress Notes (Signed)
Social Work Discharge Note Discharge Note  The overall goal for the admission was met for:   Discharge location: [facility] CAREGIVER TO PROVIDE 4 HR DURING THE [facility]  Length of Stay: Yes-15 DAYS  Discharge activity level: Yes-MIN LEVEL  Home/community participation: Yes  Services provided included: MD, RD, PT, OT, RN, CM, TR, Pharmacy and SW  Financial Services: Medicaid  Follow-up services arranged: Home Health: [facility] CARE-PT,OT,RN,SW, DME: [facility] and Patient/Family request agency HH: PREF USED BEFORE, DME: PREF USED BEFORE  Comments (or additional information):COUSIN NEVER CAME IN FOR FAMILY [place] OF THE RECOMMENDATION OF [facility] UNSURE IF PT [facility].  HE IS AWARE OF THE OPTIONS IE, NHP HAS BEEN TO TWO BEFORE WANTS TO GO HOME AND COMPETENT TO MAKE THIS DECISION.  Patient/Family verbalized understanding of follow-up arrangements: Yes  Individual responsible for coordination of the follow-up plan: SELF & BERNADETTE-COUSIN/POA  Confirmed correct DME delivered: Elease Hashimoto 02/07/2014    Elease Hashimoto

## 2014-02-07 NOTE — Progress Notes (Signed)
Social Work Patient ID: [name], male   DOB: 06/22/1951, 63 y.o.   MRN: [id number] Call from [facility] who asked if pt being discharged today, had spoken with her last week and Monday to confirm this. Discussed reason she did not return worker's or pt's call regarding setting up family education.  She had no reason.  Expressed concerns Regarding pt going home and not having 24 hr care.  He states: " I will be all right, she will care for me or I will stay in my wheelchair."   He is aware of the team's recommendation of 24 hr care and his high risk to fall if someone is not assisting him.  He is aware and plans to go home, He states: " I was at that nursing home and they let you fall there too."  He plans to go home even if he does not have 24 hr care, he is able to make his Own decisions.  Will add HHSW to home health order to monitor him at home.

## 2014-02-07 NOTE — Discharge Instructions (Signed)
Inpatient Rehab Discharge Instructions  [name] Discharge date and time: No discharge date for patient encounter.   Activities/Precautions/ Functional Status: Activity: activity as tolerated Diet: soft foods. NO ALCOHOL at all.  Wound Care: none needed Functional status:  ___ No restrictions     ___ Walk up steps independently _X__ 24/7 supervision/assistance   ___ Walk up steps with assistance ___ Intermittent supervision/assistance  ___ Bathe/dress independently ___ Walk with walker     _X__ Bathe/dress with assistance ___ Walk Independently    ___ Shower independently _X__ Walk with assistance    ___ Shower with assistance _X__ No alcohol     ___ Return to work/school ________  Special Instructions:    COMMUNITY REFERRALS UPON DISCHARGE:    Home Health:   PT, OT, RN, [place] GTXMI:[phone] Date of last service:02/07/2014   Medical Equipment/Items [place]    [phone] Other:PCS SERVICES  GENERAL COMMUNITY RESOURCES FOR PATIENT/FAMILY: Support Groups:[facility] SUPPORT GROUP  My questions have been answered and I understand these instructions. I will adhere to these goals and the provided educational materials after my discharge from the hospital.  Patient/Caregiver Signature _______________________________ Date __________  Clinician Signature _______________________________________ Date __________  Please bring this form and your medication list with you to all your follow-up doctor's appointments.

## 2014-02-07 NOTE — Progress Notes (Addendum)
Physical Therapy Note  Patient Details  Name: [name] MRN: [id number] Date of Birth: 12/01/1950 Today's Date: 02/07/2014 [id number] 63 min individual therapy, no pain  Family ed scheduled, but pt's cousin did not show up.  Discussed risk factors with CSW and pt, but pt is determined to d/ to his cousin's home, today. Reviewed home program of sequencing for locomotion, and sign to be posted on his RW for sequencing sit> stand.    neuromuscular re-education via forced use, positioning for L trunk stretching, in R sidelying with towel roll, x 10 minutes.  Pt coughed and produced copious saliva, partially sitting up as he did this.  Home program hand-out includes description of this sidelying ex; precautions were added that he should do this before a meal, or at least 1 hour after a meal to limit coughing/choking. Also in standing with LUE support, wt shifting L<>R with single steps RLE forward/backward for L wt bearing.  W/c>< mat with supervision, with max cues for including L hand and L hip, squat pivot. Pt donned L AFO with extra time, VCs.  With pt's permission, bright duct tape placed on ends of straps of shoes, to facilitate refastening straps.  Pt tends to pull straps all the way out of slots, and is unable to don shoes independently after that.  Gait with RW x 15' with min assist, max verbal and tactile cues for wt shifting, longer step lengths. Pt stated he needed to use toilet for BM.  Toilet transfer and clothing management with min guard, max cues for safety, using wall bar. Pt partially stood to wipe but was safe with cues to keep one hand on wall bar.  Pt remained in room, all needs within reach.   Evalee Gerard 02/07/2014, 9:12 AM

## 2014-02-08 NOTE — Progress Notes (Signed)
Recreational Therapy Discharge Summary Patient Details  Name: KINNETH FUJIWARA MRN: [id number] Date of Birth: [birth year] Today's Date: 02/08/2014  Long term goals set: 1  Long term goals met: 1  Comments on progress toward goals: Pt has made great progress toward goal and is discharging home at Mod I w/c level for simple TR tasks.  Pt requires min assist for simple task standing.  Pt is discharging home with family to provide/coordinate 24 hour supervision/assist. Reasons for discharge: discharge from hospital  Patient/family agrees with progress made and goals achieved: Yes  Aadan Chenier 02/08/2014, 1:44 PM

## 2014-02-13 ENCOUNTER — Encounter (HOSPITAL_COMMUNITY): Payer: Self-pay | Admitting: Dentistry

## 2014-02-14 ENCOUNTER — Telehealth: Payer: Self-pay

## 2014-02-14 NOTE — Telephone Encounter (Signed)
Kim (RN @ [facility]) is requesting two additional visits with patient. Verbal given per protocol.

## 2014-02-20 ENCOUNTER — Ambulatory Visit: Payer: Self-pay | Admitting: Internal Medicine

## 2014-03-01 ENCOUNTER — Other Ambulatory Visit (HOSPITAL_COMMUNITY): Payer: Self-pay | Admitting: Physical Medicine and Rehabilitation

## 2014-03-02 ENCOUNTER — Other Ambulatory Visit (HOSPITAL_COMMUNITY): Payer: Self-pay | Admitting: Physical Medicine and Rehabilitation

## 2014-03-07 ENCOUNTER — Other Ambulatory Visit (HOSPITAL_COMMUNITY): Payer: Self-pay | Admitting: Physical Medicine and Rehabilitation

## 2014-03-27 ENCOUNTER — Inpatient Hospital Stay: Payer: Self-pay | Admitting: Physical Medicine & Rehabilitation

## 2014-03-29 ENCOUNTER — Encounter (HOSPITAL_COMMUNITY): Payer: Self-pay | Admitting: Dentistry

## 2014-04-02 ENCOUNTER — Encounter (HOSPITAL_COMMUNITY): Payer: Self-pay | Admitting: Dentistry

## 2014-04-02 ENCOUNTER — Encounter (INDEPENDENT_AMBULATORY_CARE_PROVIDER_SITE_OTHER): Payer: Self-pay

## 2014-04-02 ENCOUNTER — Ambulatory Visit (HOSPITAL_COMMUNITY): Payer: Medicaid - Dental | Admitting: Dentistry

## 2014-04-02 VITALS — BP 120/65 | HR 79 | Temp 98.0°F

## 2014-04-02 DIAGNOSIS — K000 Anodontia: Principal | ICD-10-CM

## 2014-04-02 DIAGNOSIS — Z463 Encounter for fitting and adjustment of dental prosthetic device: Secondary | ICD-10-CM

## 2014-04-02 DIAGNOSIS — M264 Malocclusion, unspecified: Secondary | ICD-10-CM

## 2014-04-02 DIAGNOSIS — K08109 Complete loss of teeth, unspecified cause, unspecified class: Secondary | ICD-10-CM

## 2014-04-02 NOTE — Patient Instructions (Signed)
Return to clinic as scheduled for continued upper and lower complete denture fabrication. Dr. Gracieann Stannard 

## 2014-04-02 NOTE — Progress Notes (Signed)
04/02/2014  Patient:            [name] Date of Birth:  1950-08-20 MRN:                [id number]  BP 120/65  Pulse 79  Temp(Src) 98 F (36.7 C) (Oral)  [name] presents for continued upper and lower denture fabrication. Patient has not been seen recently due to hospitalization and inability to travel to his continued denture fabrication appointments.  Procedure:  Upper and lower denture wax tryin. Patient accepts esthetics, phonetics, fit and function. Patient agrees to process "as is" in 50:50 [place] 199. Patient to RTC for upper and lower denture insertion.  Lenn Cal, DDS

## 2014-04-03 ENCOUNTER — Other Ambulatory Visit: Payer: Self-pay | Admitting: *Deleted

## 2014-04-04 MED ORDER — TIOTROPIUM BROMIDE MONOHYDRATE 18 MCG IN CAPS: 18.0000 ug | ORAL_CAPSULE | Freq: Every day | RESPIRATORY_TRACT | Status: AC

## 2014-04-04 MED ORDER — ATORVASTATIN CALCIUM 80 MG PO TABS: 80.0000 mg | ORAL_TABLET | Freq: Every day | ORAL | Status: DC

## 2014-04-10 ENCOUNTER — Encounter: Payer: Self-pay | Admitting: Internal Medicine

## 2014-04-10 ENCOUNTER — Ambulatory Visit (INDEPENDENT_AMBULATORY_CARE_PROVIDER_SITE_OTHER): Payer: Medicare Other | Admitting: Internal Medicine

## 2014-04-10 VITALS — BP 118/77 | HR 75 | Temp 97.8°F | Ht 68.5 in | Wt 145.5 lb

## 2014-04-10 DIAGNOSIS — I639 Cerebral infarction, unspecified: Secondary | ICD-10-CM

## 2014-04-10 DIAGNOSIS — Z23 Encounter for immunization: Secondary | ICD-10-CM

## 2014-04-10 DIAGNOSIS — I6340 Cerebral infarction due to embolism of unspecified cerebral artery: Secondary | ICD-10-CM

## 2014-04-10 DIAGNOSIS — I10 Essential (primary) hypertension: Secondary | ICD-10-CM

## 2014-04-10 DIAGNOSIS — Z0000 Encounter for general adult medical examination without abnormal findings: Secondary | ICD-10-CM

## 2014-04-10 DIAGNOSIS — D509 Iron deficiency anemia, unspecified: Secondary | ICD-10-CM

## 2014-04-10 DIAGNOSIS — F17200 Nicotine dependence, unspecified, uncomplicated: Secondary | ICD-10-CM

## 2014-04-10 DIAGNOSIS — R569 Unspecified convulsions: Secondary | ICD-10-CM

## 2014-04-10 LAB — CBC
HEMATOCRIT: 29 % — AB (ref 39.0–52.0)
Hemoglobin: 9.4 g/dL — ABNORMAL LOW (ref 13.0–17.0)
MCH: 26.2 pg (ref 26.0–34.0)
MCHC: 32.4 g/dL (ref 30.0–36.0)
MCV: 80.8 fL (ref 78.0–100.0)
PLATELETS: 355 10*3/uL (ref 150–400)
RBC: 3.59 MIL/uL — AB (ref 4.22–5.81)
RDW: 15.9 % — AB (ref 11.5–15.5)
WBC: 5.9 10*3/uL (ref 4.0–10.5)

## 2014-04-10 MED ORDER — NICOTINE 14 MG/24HR TD PT24: 14.0000 mg | MEDICATED_PATCH | TRANSDERMAL | Status: DC

## 2014-04-10 NOTE — Patient Instructions (Addendum)
General Instructions:  1. Please schedule a follow up appointment for 2-3 months.  2. Please take all medications as prescribed.   Please follow up with Dr. Hilarie Fredrickson on 05/01/14  3. If you have worsening of your symptoms or new symptoms arise, please call the clinic [phone]), or go to the ER immediately if symptoms are severe.  You have done a great job in taking all your medications. I appreciate it very much. Please continue doing that.   Please bring your medicines with you each time you come to clinic.  Medicines may include prescription medications, over-the-counter medications, herbal remedies, eye drops, vitamins, or other pills.   Progress Toward Treatment Goals:  Treatment Goal 04/10/2014  Blood pressure at goal  Stop smoking stopped smoking  Prevent falls -    Self Care Goals & Plans:  Self Care Goal 04/10/2014  Manage my medications take my medicines as prescribed; bring my medications to every visit; refill my medications on time  Monitor my health -  Eat healthy foods drink diet soda or water instead of juice or soda; eat more vegetables; eat foods that are low in salt; eat baked foods instead of fried foods; eat fruit for snacks and desserts  Be physically active -  Stop smoking -  Meeting treatment goals maintain the current self-care plan    No flowsheet data found.   Care Management & Community Referrals:  Referral 04/10/2014  Referrals made for care management support none needed  Referrals made to community resources none

## 2014-04-10 NOTE — Assessment & Plan Note (Signed)
Flu shot given today

## 2014-04-10 NOTE — Progress Notes (Signed)
Patient ID: [name], male   DOB: 04/03/1951, 63 y.o.   MRN: [id number]   Subjective:   Patient ID: [name] male   DOB: 02-13-1951 63 y.o.   MRN: [id number]  HPI: [name] is a 63 y.o. male w/ PMHx of HTN, COPD, metastatic Prostate CA, h/o CVA, and seizures, presents to the clinic today for a follow-up visit. [name] was recently discharged from [facility] on 02/07/14 after being admitted on 01/23/14 after hospitalization for seizures. The patient has had ongoing issues w/ significant weakness and decreased ability to perform ADL's and IADL's after his recent h/o CVA. Today, [name] is in good spirits, says he is feeling well and feels that his strength has been improving since his discharge from inpatient rehab. He declined to go to SNF on discharge from CIR but since this time he has been ling at home is is well looked after by his sister and niece (accompanied him to his visit today). He is sitting in a wheelchair today, claims he is now able to stand w/ the aid of a walker but still has continued weakness in his LLE. He otherwise claims to have been very compliant with all of his medications including his Keppra and says he has not had a seizure since his most recent hospitalization. He denies any confusion, increased weakness, dizziness, SOB, chest pain, palpitations, fever, chills, nausea, or vomiting. He has also had issues w/ mixed iron deficiency and anemia of chronic disease in addition to the fact that he continues to be FOBT positive during his past hospital stay. During that time, the patient required PRBC transfusion, but since that time his Hb has been stable. He is scheduled to follow up w/ Dr. Hilarie Fredrickson on 05/01/14 for further workup of his occult GIB. [name] had colonoscopy in 10/2013 which revealed 2 sessile polyps and 2 pedunculated polyps, all of which were removed at that time.   Past Medical History  Diagnosis Date  . Hypertension 05/18/2007  . COPD 05/18/2007  .  Elevated PSA 05/25/2007  . Prostate cancer   . Seizures 12/05/2013; 12/08/2013; 12/11/2013    new onset; recurrent/notes 12/12/2013  . 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 Stroke 12/05/2012  . Cerebral embolism with cerebral infarction     residual left sided weakness/notes 12/12/2013  . Protein-calorie malnutrition, severe     [name] 12/12/2013  . Chronic kidney disease (CKD), stage II (mild)     [name] 12/12/2013   Current Outpatient Prescriptions  Medication Sig Dispense Refill  . acetaminophen (TYLENOL) 325 MG tablet Take 1-2 tablets (325-650 mg total) by mouth every 4 (four) hours as needed for mild pain.      Marland Kitchen albuterol (PROVENTIL HFA;VENTOLIN HFA) 108 (90 BASE) MCG/ACT inhaler Inhale 2 puffs into the lungs every 6 (six) hours as needed for wheezing.  8.5 g  3  . atorvastatin (LIPITOR) 80 MG tablet Take 1 tablet (80 mg total) by mouth daily.  90 tablet  4  . chlorhexidine (PERIDEX) 0.12 % solution Use as directed 15 mLs in the mouth or throat 2 (two) times daily. Use after breakfast and at bedtime:  Rinse and spit out excess, do not swallow      . clopidogrel (PLAVIX) 75 MG tablet Take 1 tablet (75 mg total) by mouth daily with breakfast.  90 tablet  0  . diclofenac sodium (VOLTAREN) 1 % GEL Apply 2 g topically 4 (four) times daily. To left knee  3 Tube  1  . docusate sodium 100 MG CAPS Take 100 mg by mouth daily.  10 capsule  0  . ferrous sulfate 325 (65 FE) MG tablet Take 1 tablet (325 mg total) by mouth 2 (two) times daily with a meal.  60 tablet  1  . folic acid (FOLVITE) 1 MG tablet Take 1 tablet (1 mg total) by mouth daily.  30 tablet  1  . hydrochlorothiazide (HYDRODIURIL) 25 MG tablet Take 1 tablet (25 mg total) by mouth daily.  30 tablet  5  . levETIRAcetam (KEPPRA) 750 MG tablet Take 2 tablets (1,500 mg total) by mouth 2 (two) times daily.  120 tablet  2  . lisinopril (PRINIVIL,ZESTRIL) 10 MG tablet Take 0.5 tablets (5 mg total) by mouth 2 (two) times daily.  30 tablet  1  .  Multiple Vitamin (MULTIVITAMIN WITH MINERALS) TABS tablet Take 1 tablet by mouth daily.      . nicotine (NICODERM CQ - DOSED IN MG/24 HOURS) 14 mg/24hr patch Place 1 patch (14 mg total) onto the skin daily.  30 patch  1  . pantoprazole (PROTONIX) 40 MG tablet Take 1 tablet (40 mg total) by mouth daily.  30 tablet  1  . senna-docusate (SENOKOT-S) 8.6-50 MG per tablet Take 2 tablets by mouth 2 (two) times daily. For constipation  120 tablet  1  . thiamine 100 MG tablet Take 1 tablet (100 mg total) by mouth daily.  30 tablet  1  . tiotropium (SPIRIVA HANDIHALER) 18 MCG inhalation capsule Place 1 capsule (18 mcg total) into inhaler and inhale daily.  90 capsule  4   No current facility-administered medications for this visit.    Review of Systems: General: Denies fever, chills, diaphoresis, appetite change and fatigue.  Respiratory: Denies SOB, DOE, cough, and wheezing.   Cardiovascular: Denies chest pain and palpitations.  Gastrointestinal: Denies nausea, vomiting, abdominal pain, and diarrhea.  Genitourinary: Denies dysuria, increased frequency, and flank pain. Musculoskeletal: Positive for gait problem. Denies myalgias, back pain, joint swelling, arthralgias.  Skin: Denies pallor, rash and wounds.  Neurological: Positive for generalized weakness w/ left-sided residual weakness. Denies dizziness, seizures, syncope, lightheadedness, numbness and headaches.  Psychiatric/Behavioral: Denies mood changes, and sleep disturbances.  Objective:   Physical Exam: Filed Vitals:   04/10/14 1402  BP: 118/77  Pulse: 75  Temp: 97.8 F (36.6 C)  TempSrc: Oral  Height: 5' 8.5" (1.74 m)  Weight: 145 lb 8 oz (65.998 kg)  SpO2: 100%   Physical Exam: General: African American male, appears older than stated age, alert, cooperative, NAD. HEENT: PERRL, EOMI. Moist mucus membranes Neck: Full range of motion without pain, supple, no lymphadenopathy or carotid bruits Lungs: Clear to ascultation  bilaterally, normal work of respiration, no wheezes, rales, rhonchi Heart: RRR, no murmurs, gallops, or rubs Abdomen: Soft, non-tender, non-distended, BS + Extremities: No cyanosis, clubbing, or edema. Large non-tender subpatellar cystic-like mass 4 cm x 4 cm x 6 cm (chronic).  Neurologic: Alert & oriented X3, cranial nerves II-XII intact. UE strength 5/5 bilaterally, LLE w/ 4/5 strength, RLE 5/5.    Assessment & Plan:   Please see problem based assessment and plan.

## 2014-04-11 ENCOUNTER — Ambulatory Visit (HOSPITAL_COMMUNITY): Payer: Medicaid - Dental | Admitting: Dentistry

## 2014-04-11 ENCOUNTER — Encounter (HOSPITAL_COMMUNITY): Payer: Self-pay | Admitting: Dentistry

## 2014-04-11 ENCOUNTER — Encounter: Payer: Self-pay | Admitting: Internal Medicine

## 2014-04-11 VITALS — BP 138/80 | HR 75 | Temp 97.5°F

## 2014-04-11 DIAGNOSIS — K08109 Complete loss of teeth, unspecified cause, unspecified class: Secondary | ICD-10-CM

## 2014-04-11 DIAGNOSIS — K000 Anodontia: Principal | ICD-10-CM

## 2014-04-11 DIAGNOSIS — Z463 Encounter for fitting and adjustment of dental prosthetic device: Secondary | ICD-10-CM

## 2014-04-11 DIAGNOSIS — M264 Malocclusion, unspecified: Secondary | ICD-10-CM

## 2014-04-11 NOTE — Progress Notes (Signed)
04/11/2014  Patient:            [name] Date of Birth:  1951-03-01 MRN:                [id number]  BP 138/80  Pulse 75  Temp(Src) 97.5 F (36.4 C) (Oral)   [name] presents for insertion of upper and lower complete dentures. Procedure: Pressure indicating paste was applied to the dentures. Adjustments were made as needed. [place] Occlusion evaluated and adjustments made as needed for Centric Relation and protrusive strokes. Good esthetics, phonetics, fit, and function noted. Patient accepts results. Post op instructions provided in written and verbal formats on use and care of dentures. Gave patient denture brush and cup. Patient to keep dentures out if sore spots develop. Use salt water rinses as needed to aid healing. Return to clinic as scheduled for denture adjustment.  Call if problems arise before then. Patient dismissed in stable condition.  Lenn Cal, DDS

## 2014-04-11 NOTE — Assessment & Plan Note (Signed)
Patient w/ recent d/c from CIR on 02/07/14 after hospitalization for seizure activity. Patient received PT, OT, and SLP during his CIR stay and claims he feels stronger than he did previously. The original plan was to discharge the patient to SNF from CIR, however, [name] declined. He claims he is being well taken care of at home by his sister and niece. Offered the patient further outpatient PT and he also declined this as well. He has a series of exercises he performs at home that his niece agrees helps him with his strength.  -Follow up in 3 months

## 2014-04-11 NOTE — Patient Instructions (Signed)
Instructions for Denture Use and Care  Congratulations, you are on the way to oral rehabilitation!  You have just received a new set of complete or partial dentures.  These prostheses will help to improve both your appearance and chewing ability.  These instructions will help you get adjusted to your dentures as well as care for them properly.  Please read these instructions carefully and completely as soon as you get home.  If you or your caregiver have any questions please notify the [facility] at [phone].  HOW YOUR DENTURES LOOK AND FEEL Soon after you begin wearing your dentures, you may feel that your dentures are too large or even loose.  As our mouth and facial muscles become accustomed to the dentures, these feelings will go away.  You also may feel that you are salivating more than you normally do.  This feeling should go away as you get used to having the dentures in your mouth.  You may bite your cheek or your tongue; this will eventually resolve itself as you wear your dentures.  Some soreness is to be expected, but you should not hurt.  If your mouth hurts, call your dentist.  A denture adhesive may occasionally be necessary to hold your dentures in place more securely.  The dentist will let you know when one is recommended for you.  SPEAKING Wearing dentures will change the sound of your voice initially.  This will be noticed by you more than anyone else.  Bite and swallow before you speak, in order to place your dentures in position so that you may speak more clearly.  Practice speaking by reading aloud or counting from 1 to 100 very slowly and distinctly.  After some practice your mouth will become accustomed to your dentures and you will speak more clearly.  EATING Chewing will definitely be different after you receive your dentures.  With a little practice and patience you should be able to eat just about any kind of food.  Begin by eating small quantities of food  that are cut into small pieces.  Star with soft foods such as eggs, cooked vegetables, or puddings.  As you gain confidence advance  Your diet to whatever texture foods you can tolerate.  DENTURE CARE Dentures can collect plaque and calculus much the same as natural teeth can.  If not removed on a regular basis, your dentures will not look or feel clean, and you will experience denture odor.  It is very important that you remove your dentures at bedtime and clean them thoroughly.  You should: 1. Clean your dentures over a sink full of water so if dropped, breakage will be prevented. 2. Rinse your dentures with cool water to remove any large food particles. 3. Use soap and water or a denture cleanser or paste to clean the dentures.  Do not use regular toothpaste as it may abrade the denture base or teeth. 4. Use a moistened denture brush to clean all surfaces (inside and outside). 5. Rinse thoroughly to remove any remaining soap or denture cleanser. 6. Use a soft bristle toothbrush to gently brush any natural teeth, gums, tongue, and palate at bedtime and before reinserting your dentures. 7. Do not sleep with your dentures in your mouth at night.  Remove your dentures and soak them overnight in a denture cup filled with water or denture solution as recommended by your dentist.  This routine will become second nature and will increase the life and comfort   of your dentures.  Please do not try to adjust these dentures yourself; you could damage them.  FOLLOW-UP You should call or make an appointment with your dentist.  Your dentist would like to see you at least once a year for a check-up and examination. 

## 2014-04-11 NOTE — Assessment & Plan Note (Signed)
No recent seizures, compliant w/ Keppra.  -Continue Keppra

## 2014-04-11 NOTE — Assessment & Plan Note (Signed)
Patient w/ h/o anemia, most likely combined iron deficiency (confirmed by iron panel) + chronic disease. Patient has been shown to be FOBT positive in the past, required RBC transfusion during last hospitalization. Colonoscopy in 10/2013 showed 4 polyps, all removed at that time.  -Scheduled to follow up w/ Dr. Zenovia Jarred on 05/01/14 -CBC shows stable Hb at 9.4 -Continue Ferrous sulfate + Folic acid

## 2014-04-11 NOTE — Assessment & Plan Note (Signed)
BP Readings from Last 3 Encounters:  04/10/14 118/77  04/02/14 120/65  02/07/14 136/90    Lab Results  Component Value Date   NA 140 02/01/2014   K 3.9 02/01/2014   CREATININE 0.90 02/01/2014    Assessment: Blood pressure control: controlled Progress toward BP goal:  at goal Comments: Compliant w/ medications, receiving assistance from family members w/ his medications.   Plan: Medications:  continue current medications; Lisinopril 5 mg bid (changed in CIR to prevent orthostatic changes.) + HCTZ 25 mg po qd.  Educational resources provided:   Self management tools provided:   Other plans: RTC in 3 months for follow up.

## 2014-04-11 NOTE — Assessment & Plan Note (Signed)
Patient was previously an every day smoker but says he has not smoked in quite some time. He says he still has cravings and would like to have a cigarette every now and again. He used to smoke 1/2 pack a day and had for several years.  -Given Rx for nicotine patch

## 2014-04-17 ENCOUNTER — Encounter (HOSPITAL_COMMUNITY): Payer: Self-pay | Admitting: Dentistry

## 2014-04-18 ENCOUNTER — Encounter (HOSPITAL_COMMUNITY): Payer: Self-pay | Admitting: Dentistry

## 2014-04-18 ENCOUNTER — Ambulatory Visit (HOSPITAL_COMMUNITY): Payer: Medicaid - Dental | Admitting: Dentistry

## 2014-04-18 VITALS — BP 131/67 | HR 82 | Temp 97.9°F

## 2014-04-18 DIAGNOSIS — K08109 Complete loss of teeth, unspecified cause, unspecified class: Secondary | ICD-10-CM

## 2014-04-18 DIAGNOSIS — M264 Malocclusion, unspecified: Secondary | ICD-10-CM

## 2014-04-18 DIAGNOSIS — K000 Anodontia: Principal | ICD-10-CM

## 2014-04-18 DIAGNOSIS — Z463 Encounter for fitting and adjustment of dental prosthetic device: Secondary | ICD-10-CM

## 2014-04-18 NOTE — Progress Notes (Signed)
Internal Medicine Clinic Attending Date of visit: 04/10/2014   Case discussed with Dr. Jones soon after the resident saw the patient.  We reviewed the resident's history and exam and pertinent patient test results.  I agree with the assessment, diagnosis, and plan of care documented in the resident's note. 

## 2014-04-18 NOTE — Progress Notes (Signed)
04/18/2014  Patient:            [name] Date of Birth:  05-17-1951 MRN:                [id number]  BP 131/67  Pulse 82  Temp(Src) 97.9 F (36.6 C) (Oral)   [name] presents for evaluation of recently inserted upper and lower complete dentures.  SUBJECTIVE: Patient is complaining of on the initial lower left discomfort. Patient has not worn the dentures much since then. OBJECTIVE: There is no sign of denture irritation or erythema. Procedure: Pressure indicating paste was applied to the dentures. Adjustments were made as needed. [place] Thick pressure indicating paste was applied to the denture borders. Adjustments were made as needed. [place] Occlusion evaluated and adjustments made as needed for Centric Relation and protrusive strokes. Patient accepts results. Patient to keep dentures out if sore spots develop. Use salt water rinses as needed to aid healing. Return to clinic as scheduled for denture adjustment.  Call if problems arise before then. Patient dismissed in stable condition.  Lenn Cal, DDS

## 2014-04-18 NOTE — Patient Instructions (Signed)
Keep dentures out if sore spots arise. Use salt water rinses as needed to aid healing. Return to clinic as scheduled for denture adjustment. Call if problems arise before then. Dr. Enrique Sack

## 2014-05-01 ENCOUNTER — Ambulatory Visit: Payer: Self-pay | Admitting: Internal Medicine

## 2014-05-01 ENCOUNTER — Encounter: Payer: Self-pay | Admitting: *Deleted

## 2014-05-01 NOTE — Progress Notes (Signed)
Patient ID: [name], male   DOB: 1951-07-10, 63 y.o.   MRN: [id number] The patient's chart has been reviewed by Dr. Hilarie Fredrickson  and the recommendations are noted below.  Letter mailed to the patient informing him of no show appointment, advised to call if he wishes to reschedule appt.

## 2014-05-02 ENCOUNTER — Encounter (HOSPITAL_COMMUNITY): Payer: Self-pay | Admitting: Dentistry

## 2014-05-03 ENCOUNTER — Ambulatory Visit: Payer: Self-pay | Admitting: Nurse Practitioner

## 2014-05-09 ENCOUNTER — Encounter (HOSPITAL_COMMUNITY): Payer: Self-pay | Admitting: Dentistry

## 2014-05-14 ENCOUNTER — Ambulatory Visit (INDEPENDENT_AMBULATORY_CARE_PROVIDER_SITE_OTHER): Payer: Medicare Other | Admitting: Nurse Practitioner

## 2014-05-14 ENCOUNTER — Encounter: Payer: Self-pay | Admitting: Nurse Practitioner

## 2014-05-14 VITALS — BP 120/62 | HR 74 | Ht 68.0 in | Wt 143.4 lb

## 2014-05-14 DIAGNOSIS — R195 Other fecal abnormalities: Secondary | ICD-10-CM

## 2014-05-14 DIAGNOSIS — D509 Iron deficiency anemia, unspecified: Secondary | ICD-10-CM

## 2014-05-14 DIAGNOSIS — I6340 Cerebral infarction due to embolism of unspecified cerebral artery: Secondary | ICD-10-CM

## 2014-05-14 NOTE — Patient Instructions (Signed)
Please follow up as needed or if symptoms come back

## 2014-05-14 NOTE — Progress Notes (Addendum)
     History of Present Illness:  Patient is a 63 year old male with multiple medical problems including, but not limited to hypertension, COPD, recurrent CVAs, and metastatic prostate cancer . We saw him in March of this year for screening colonoscopy with findings of multiple polyps (adenomatous), moderate diverticulosis, a medium-sized lipoma of the hepatic flexure, and moderate to large internal hemorrhoids. He is due for surveillance colonoscopy at 3 year interval  Patient was hospitalized mid June with seizures. During that admission hemoglobin found to be down about 1 g from baseline, he was hemoccult positive. Ferritin a month prior was only 16. Patient was transfused 2 units of blood and hgb rose from 6.5 to 9.4 by the time of discharge to Rehabilitation facility on 01/24/14. Patient was advised to follow up outpatient with [place] regarding "blood in stool". He did schedule and appointment but cancelled it and rescheduled for today. Patient's POA, [name] is with him. Patient has not had any overt rectal bleeding. Stools are dark, on iron. No bowel changes or abdominal pain. He feels fine.  Current Medications, Allergies, Past Medical History, Past Surgical History, Family History and Social History were reviewed in [employer] record.   Physical Exam: General: Pleasant, well developed , black male in a wheelchair in no acute distress Head: Normocephalic and atraumatic Eyes:  sclerae anicteric, conjunctiva pink  Ears: Normal auditory acuity Lungs: Clear throughout to auscultation Heart: Regular rate and rhythm Abdomen: Soft, non distended, non-tender. No masses, no hepatomegaly. Normal bowel sounds Rectal: not done, wheelchair bound.  Musculoskeletal: Symmetrical with no gross deformities  Extremities: No edema  Neurological: Alert oriented x 4, grossly nonfocal Psychological:  Alert and cooperative. Normal mood and affect  Assessment and Recommendations: 1.  Iron deficiency anemia on Plavix with ferritin of 73.   63 year old male here for followup on " blood in stool while in hospital". Reason for visit not really apparent . The best I can tell patient was hospitalized in June for seizures and hgb noted at that time to be below baseline and stools were heme positive. He wast transfused 2 units of blood and advised to followup with [place] outpatient. No overt bleeding at home, stools dark on iron.  have done an extensive review of the records.   Baseline hgb around 12, progressive anemia began mid March.  Hemoglobin declined between late March and mid April when it went from 11 to 9.1. Patient had multiple teeth extracted mid-March, I see no other procedures around that time.    In early May hgb declined further to upper 6 range while he was hospitalized with seizures. Transfused 2 units of blood with a 2 gram rise in hgb to 8.9.   In mid June patient hospitalized with seizures. Hgb found to be in 6 range again. After two units of blood  and oral iron his hgb has since been stable in low 9 range for the last couple of months.    Recommendations: Continue oral iron. Patient is at increased risk for procedures but if anemia persists he may need EGD, +/- small bowel capsule study as he is iron deficient with heme positive stools on plavix. He just had a screening colonoscopy in March of this year.   Addendum: Reviewed and agree with management.  Agree that EGD would be the next step. I would also refer to heme for further eval of his anemia given his chronic disease Jerene Bears, MD

## 2014-05-18 ENCOUNTER — Telehealth: Payer: Self-pay | Admitting: *Deleted

## 2014-05-18 NOTE — Telephone Encounter (Signed)
Spoke to [employer] last labs on patient where done on 04-10-2014, Nevin Bloodgood does not want to repeat labs Dr. Vena Rua hospital week is 06-11-2014 Will call patients POA--PLEASE CALL [facility] FOR ALL APPOINTMENTS PER PT Will send clearance for Plavix to Dr. Aundra Dubin __________________________________________________________________________  Left message on home phone Mobile phone is disconnected

## 2014-05-18 NOTE — Telephone Encounter (Signed)
Message copied by Carlyle Dolly on Fri May 18, 2014 12:05 PM ------      Message from: Willia Craze      Created: Fri May 18, 2014 11:26 AM       Claiborne Billings,       Can you please call patient's power of attorney to schedule EGD. His hgb has dropped over last few months. Doesn't need colonoscopy (had one 2013). He needs to be done at [facility] and dr.pyrtle said he could do it during hospital week in November BUT NOT ON THAT  WED of the week.  Diagnosis for EGD is iron def anemia.       Thanks ------

## 2014-05-18 NOTE — Telephone Encounter (Signed)
  05/18/2014   RE: [name] DOB: 1950/09/26 MRN: [id number]   Dear Dr Aundra Dubin,    We are going to schedule the above patient for an Upper Endoscopy. Our records show that he is on an anticoagulation therapy.   Please advise if the patient can come off his therapy of Plavix and how long prior to the procedure, which will be scheduled for June 12, 2014.  Please route the completed form to Evette Georges., CMA   Sincerely,    Hope Pigeon

## 2014-05-21 ENCOUNTER — Encounter (HOSPITAL_COMMUNITY): Payer: Self-pay | Admitting: Dentistry

## 2014-05-21 NOTE — Telephone Encounter (Signed)
Please route message to Luanne Bras he is PCP for this patient thanks  TM            Per Dr. Aundra Dubin patient's PCP is in charge of patient's Plavix  When we schedule patient we will contact PCP

## 2014-05-21 NOTE — Telephone Encounter (Signed)
Per Tye Savoy, NP okay to send letter to patient/ patient's POA. Per Tye Savoy, NP patient is not urgent just has iron deficiency anemia. Sending letter today.

## 2014-05-21 NOTE — Telephone Encounter (Signed)
Unable to reach patient and or POA at home number

## 2014-05-23 ENCOUNTER — Other Ambulatory Visit: Payer: Self-pay | Admitting: *Deleted

## 2014-05-23 DIAGNOSIS — I639 Cerebral infarction, unspecified: Secondary | ICD-10-CM

## 2014-05-24 ENCOUNTER — Other Ambulatory Visit (HOSPITAL_COMMUNITY): Payer: Self-pay | Admitting: Internal Medicine

## 2014-05-24 ENCOUNTER — Other Ambulatory Visit: Payer: Self-pay | Admitting: Internal Medicine

## 2014-05-24 MED ORDER — PANTOPRAZOLE SODIUM 40 MG PO TBEC: 40.0000 mg | DELAYED_RELEASE_TABLET | Freq: Every day | ORAL | Status: DC

## 2014-05-24 MED ORDER — CLOPIDOGREL BISULFATE 75 MG PO TABS
75.0000 mg | ORAL_TABLET | Freq: Every day | ORAL | Status: DC
Start: 2014-05-24 — End: 2014-06-28

## 2014-05-28 ENCOUNTER — Other Ambulatory Visit: Payer: Self-pay | Admitting: Internal Medicine

## 2014-06-03 ENCOUNTER — Inpatient Hospital Stay (HOSPITAL_COMMUNITY)
Admission: EM | Admit: 2014-06-03 | Discharge: 2014-06-06 | DRG: 100 | Disposition: A | Discharge status: Home / Self Care | Payer: Medicare Other | Attending: Internal Medicine | Admitting: Internal Medicine

## 2014-06-03 ENCOUNTER — Encounter (HOSPITAL_COMMUNITY): Payer: Self-pay | Admitting: Emergency Medicine

## 2014-06-03 DIAGNOSIS — I129 Hypertensive chronic kidney disease with stage 1 through stage 4 chronic kidney disease, or unspecified chronic kidney disease: Secondary | ICD-10-CM | POA: Diagnosis present

## 2014-06-03 DIAGNOSIS — C61 Malignant neoplasm of prostate: Secondary | ICD-10-CM | POA: Diagnosis present

## 2014-06-03 DIAGNOSIS — C7951 Secondary malignant neoplasm of bone: Secondary | ICD-10-CM | POA: Diagnosis present

## 2014-06-03 DIAGNOSIS — D509 Iron deficiency anemia, unspecified: Secondary | ICD-10-CM

## 2014-06-03 DIAGNOSIS — D496 Neoplasm of unspecified behavior of brain: Secondary | ICD-10-CM

## 2014-06-03 DIAGNOSIS — N182 Chronic kidney disease, stage 2 (mild): Secondary | ICD-10-CM | POA: Diagnosis present

## 2014-06-03 DIAGNOSIS — J449 Chronic obstructive pulmonary disease, unspecified: Secondary | ICD-10-CM | POA: Diagnosis present

## 2014-06-03 DIAGNOSIS — Z89421 Acquired absence of other right toe(s): Secondary | ICD-10-CM | POA: Diagnosis not present

## 2014-06-03 DIAGNOSIS — D62 Acute posthemorrhagic anemia: Secondary | ICD-10-CM | POA: Diagnosis present

## 2014-06-03 DIAGNOSIS — R569 Unspecified convulsions: Secondary | ICD-10-CM

## 2014-06-03 DIAGNOSIS — Z86010 Personal history of colonic polyps: Secondary | ICD-10-CM

## 2014-06-03 DIAGNOSIS — Z9119 Patient's noncompliance with other medical treatment and regimen: Secondary | ICD-10-CM | POA: Diagnosis present

## 2014-06-03 DIAGNOSIS — K635 Polyp of colon: Secondary | ICD-10-CM | POA: Diagnosis present

## 2014-06-03 DIAGNOSIS — D649 Anemia, unspecified: Secondary | ICD-10-CM | POA: Diagnosis present

## 2014-06-03 DIAGNOSIS — E43 Unspecified severe protein-calorie malnutrition: Secondary | ICD-10-CM | POA: Diagnosis present

## 2014-06-03 DIAGNOSIS — Z720 Tobacco use: Secondary | ICD-10-CM | POA: Diagnosis present

## 2014-06-03 DIAGNOSIS — D638 Anemia in other chronic diseases classified elsewhere: Secondary | ICD-10-CM | POA: Diagnosis present

## 2014-06-03 DIAGNOSIS — Z8546 Personal history of malignant neoplasm of prostate: Secondary | ICD-10-CM | POA: Diagnosis not present

## 2014-06-03 DIAGNOSIS — K922 Gastrointestinal hemorrhage, unspecified: Secondary | ICD-10-CM | POA: Diagnosis present

## 2014-06-03 DIAGNOSIS — F17210 Nicotine dependence, cigarettes, uncomplicated: Secondary | ICD-10-CM | POA: Diagnosis present

## 2014-06-03 DIAGNOSIS — N179 Acute kidney failure, unspecified: Secondary | ICD-10-CM | POA: Diagnosis present

## 2014-06-03 DIAGNOSIS — G40909 Epilepsy, unspecified, not intractable, without status epilepticus: Principal | ICD-10-CM | POA: Diagnosis present

## 2014-06-03 DIAGNOSIS — R195 Other fecal abnormalities: Secondary | ICD-10-CM

## 2014-06-03 DIAGNOSIS — Z6821 Body mass index (BMI) 21.0-21.9, adult: Secondary | ICD-10-CM | POA: Diagnosis not present

## 2014-06-03 DIAGNOSIS — K2980 Duodenitis without bleeding: Secondary | ICD-10-CM | POA: Diagnosis present

## 2014-06-03 DIAGNOSIS — D500 Iron deficiency anemia secondary to blood loss (chronic): Secondary | ICD-10-CM | POA: Diagnosis present

## 2014-06-03 DIAGNOSIS — Z7902 Long term (current) use of antithrombotics/antiplatelets: Secondary | ICD-10-CM

## 2014-06-03 DIAGNOSIS — D179 Benign lipomatous neoplasm, unspecified: Secondary | ICD-10-CM | POA: Diagnosis present

## 2014-06-03 DIAGNOSIS — Z79899 Other long term (current) drug therapy: Secondary | ICD-10-CM

## 2014-06-03 DIAGNOSIS — Z8673 Personal history of transient ischemic attack (TIA), and cerebral infarction without residual deficits: Secondary | ICD-10-CM | POA: Diagnosis not present

## 2014-06-03 DIAGNOSIS — K5790 Diverticulosis of intestine, part unspecified, without perforation or abscess without bleeding: Secondary | ICD-10-CM | POA: Diagnosis present

## 2014-06-03 DIAGNOSIS — Z8249 Family history of ischemic heart disease and other diseases of the circulatory system: Secondary | ICD-10-CM

## 2014-06-03 DIAGNOSIS — K2970 Gastritis, unspecified, without bleeding: Secondary | ICD-10-CM | POA: Diagnosis present

## 2014-06-03 DIAGNOSIS — I10 Essential (primary) hypertension: Secondary | ICD-10-CM | POA: Diagnosis present

## 2014-06-03 LAB — URINALYSIS, ROUTINE W REFLEX MICROSCOPIC
Glucose, UA: NEGATIVE mg/dL
KETONES UR: 15 mg/dL — AB
LEUKOCYTES UA: NEGATIVE
NITRITE: NEGATIVE
PH: 5 (ref 5.0–8.0)
Protein, ur: 30 mg/dL — AB
Specific Gravity, Urine: 1.022 (ref 1.005–1.030)
Urobilinogen, UA: 0.2 mg/dL (ref 0.0–1.0)

## 2014-06-03 LAB — CBC WITH DIFFERENTIAL/PLATELET
BASOS ABS: 0 10*3/uL (ref 0.0–0.1)
Basophils Relative: 0 % (ref 0–1)
Eosinophils Absolute: 0 10*3/uL (ref 0.0–0.7)
Eosinophils Relative: 0 % (ref 0–5)
HEMATOCRIT: 21.8 % — AB (ref 39.0–52.0)
Hemoglobin: 6.7 g/dL — CL (ref 13.0–17.0)
Lymphocytes Relative: 27 % (ref 12–46)
Lymphs Abs: 1.8 10*3/uL (ref 0.7–4.0)
MCH: 22.8 pg — AB (ref 26.0–34.0)
MCHC: 30.7 g/dL (ref 30.0–36.0)
MCV: 74.1 fL — ABNORMAL LOW (ref 78.0–100.0)
Monocytes Absolute: 0.5 10*3/uL (ref 0.1–1.0)
Monocytes Relative: 8 % (ref 3–12)
NEUTROS ABS: 4.4 10*3/uL (ref 1.7–7.7)
Neutrophils Relative %: 65 % (ref 43–77)
Platelets: 369 10*3/uL (ref 150–400)
RBC: 2.94 MIL/uL — ABNORMAL LOW (ref 4.22–5.81)
RDW: 16.3 % — AB (ref 11.5–15.5)
WBC: 6.7 10*3/uL (ref 4.0–10.5)

## 2014-06-03 LAB — BASIC METABOLIC PANEL
ANION GAP: 12 (ref 5–15)
BUN: 25 mg/dL — ABNORMAL HIGH (ref 6–23)
CALCIUM: 9.7 mg/dL (ref 8.4–10.5)
CO2: 25 meq/L (ref 19–32)
Chloride: 101 mEq/L (ref 96–112)
Creatinine, Ser: 2.09 mg/dL — ABNORMAL HIGH (ref 0.50–1.35)
GFR calc Af Amer: 37 mL/min — ABNORMAL LOW (ref >90)
GFR, EST NON AFRICAN AMERICAN: 32 mL/min — AB (ref >90)
Glucose, Bld: 108 mg/dL — ABNORMAL HIGH (ref 70–99)
POTASSIUM: 3.7 meq/L (ref 3.7–5.3)
SODIUM: 138 meq/L (ref 137–147)

## 2014-06-03 LAB — I-STAT CHEM 8, ED
BUN: 25 mg/dL — ABNORMAL HIGH (ref 6–23)
CHLORIDE: 102 meq/L (ref 96–112)
Calcium, Ion: 1.33 mmol/L — ABNORMAL HIGH (ref 1.13–1.30)
Creatinine, Ser: 2.2 mg/dL — ABNORMAL HIGH (ref 0.50–1.35)
Glucose, Bld: 103 mg/dL — ABNORMAL HIGH (ref 70–99)
HCT: 25 % — ABNORMAL LOW (ref 39.0–52.0)
Hemoglobin: 8.5 g/dL — ABNORMAL LOW (ref 13.0–17.0)
Potassium: 3.7 mEq/L (ref 3.7–5.3)
Sodium: 139 mEq/L (ref 137–147)
TCO2: 28 mmol/L (ref 0–100)

## 2014-06-03 LAB — URINE MICROSCOPIC-ADD ON

## 2014-06-03 LAB — HEMOGLOBIN AND HEMATOCRIT, BLOOD
HEMATOCRIT: 21.6 % — AB (ref 39.0–52.0)
Hemoglobin: 6.5 g/dL — CL (ref 13.0–17.0)

## 2014-06-03 LAB — POC OCCULT BLOOD, ED: FECAL OCCULT BLD: POSITIVE — AB

## 2014-06-03 LAB — PREPARE RBC (CROSSMATCH)

## 2014-06-03 MED ORDER — ALBUTEROL SULFATE HFA 108 (90 BASE) MCG/ACT IN AERS: 2.0000 | INHALATION_SPRAY | Freq: Four times a day (QID) | RESPIRATORY_TRACT | Status: DC | PRN

## 2014-06-03 MED ORDER — ALBUTEROL SULFATE (2.5 MG/3ML) 0.083% IN NEBU: 2.5000 mg | INHALATION_SOLUTION | Freq: Four times a day (QID) | RESPIRATORY_TRACT | Status: DC | PRN

## 2014-06-03 MED ORDER — ATORVASTATIN CALCIUM 80 MG PO TABS
80.0000 mg | ORAL_TABLET | Freq: Every day | ORAL | Status: DC
  Administered 2014-06-03 – 2014-06-05 (×3): 80 mg via ORAL
  Filled 2014-06-03 (×4): qty 1

## 2014-06-03 MED ORDER — SODIUM CHLORIDE 0.9 % IV SOLN: Freq: Once | INTRAVENOUS | Status: DC

## 2014-06-03 MED ORDER — FOLIC ACID 1 MG PO TABS
1.0000 mg | ORAL_TABLET | Freq: Every day | ORAL | Status: DC
  Administered 2014-06-04 – 2014-06-05 (×2): 1 mg via ORAL
  Filled 2014-06-03 (×3): qty 1

## 2014-06-03 MED ORDER — SODIUM CHLORIDE 0.9 % IV SOLN: INTRAVENOUS | Status: DC

## 2014-06-03 MED ORDER — SODIUM CHLORIDE 0.9 % IJ SOLN
3.0000 mL | Freq: Two times a day (BID) | INTRAMUSCULAR | Status: DC
  Administered 2014-06-03 – 2014-06-06 (×6): 3 mL via INTRAVENOUS

## 2014-06-03 MED ORDER — LEVETIRACETAM 750 MG PO TABS
1500.0000 mg | ORAL_TABLET | Freq: Two times a day (BID) | ORAL | Status: DC
  Administered 2014-06-03 – 2014-06-06 (×6): 1500 mg via ORAL
  Filled 2014-06-03 (×8): qty 2

## 2014-06-03 MED ORDER — ONDANSETRON HCL 4 MG PO TABS: 4.0000 mg | ORAL_TABLET | Freq: Four times a day (QID) | ORAL | Status: DC | PRN

## 2014-06-03 MED ORDER — ONDANSETRON HCL 4 MG/2ML IJ SOLN: 4.0000 mg | Freq: Four times a day (QID) | INTRAMUSCULAR | Status: DC | PRN

## 2014-06-03 MED ORDER — DICLOFENAC SODIUM 1 % TD GEL: 1.0000 "application " | Freq: Three times a day (TID) | TRANSDERMAL | Status: DC | PRN

## 2014-06-03 MED ORDER — CHLORHEXIDINE GLUCONATE 0.12 % MT SOLN
15.0000 mL | Freq: Every day | OROMUCOSAL | Status: DC
  Administered 2014-06-03 – 2014-06-06 (×4): 15 mL via OROMUCOSAL
  Filled 2014-06-03 (×4): qty 15

## 2014-06-03 MED ORDER — LORAZEPAM 1 MG PO TABS
2.0000 mg | ORAL_TABLET | Freq: Once | ORAL | Status: AC
Start: 2014-06-03 — End: 2014-06-03
  Administered 2014-06-03: 2 mg via ORAL
  Filled 2014-06-03: qty 2

## 2014-06-03 MED ORDER — PANTOPRAZOLE SODIUM 40 MG IV SOLR
40.0000 mg | Freq: Two times a day (BID) | INTRAVENOUS | Status: DC
  Administered 2014-06-03 – 2014-06-06 (×6): 40 mg via INTRAVENOUS
  Filled 2014-06-03 (×8): qty 40

## 2014-06-03 MED ORDER — FERROUS SULFATE 325 (65 FE) MG PO TABS
325.0000 mg | ORAL_TABLET | Freq: Every day | ORAL | Status: DC
  Administered 2014-06-04 – 2014-06-05 (×2): 325 mg via ORAL
  Filled 2014-06-03 (×4): qty 1

## 2014-06-03 MED ORDER — TIOTROPIUM BROMIDE MONOHYDRATE 18 MCG IN CAPS
18.0000 ug | ORAL_CAPSULE | Freq: Every day | RESPIRATORY_TRACT | Status: DC
  Administered 2014-06-04 – 2014-06-06 (×3): 18 ug via RESPIRATORY_TRACT
  Filled 2014-06-03: qty 5

## 2014-06-03 MED ORDER — SODIUM CHLORIDE 0.9 % IJ SOLN: 3.0000 mL | Freq: Two times a day (BID) | INTRAMUSCULAR | Status: DC

## 2014-06-03 MED ORDER — SODIUM CHLORIDE 0.9 % IV SOLN
INTRAVENOUS | Status: DC
  Administered 2014-06-05 – 2014-06-06 (×3): via INTRAVENOUS

## 2014-06-03 NOTE — ED Notes (Signed)
Bladder was scanned. 73 mL present

## 2014-06-03 NOTE — ED Notes (Signed)
Pt arrived by [facility] from home. Pt has hx of seizures and had a seizure this morning at 0830 then 2 seizures at 1130. Pt was seen by EMS this morning at 0830 and refused transport. Granddaughter talked pt into being transported the 2nd episode. Pt has not been postictal with EMS, granddaughter was not sure but stated that pt has been acting like he normally does with seizures. CBG-143 BP-105/64 hr-90 Last seizure was 6 months ago.

## 2014-06-03 NOTE — ED Provider Notes (Signed)
CSN: [id number]     Arrival date & time 06/03/14  1232 History   First MD Initiated Contact with Patient 06/03/14 1234     Chief Complaint  Patient presents with  . Seizures     (Consider location/radiation/quality/duration/timing/severity/associated sxs/prior Treatment) Patient is a 63 y.o. male presenting with seizures. The history is provided by the patient and the EMS personnel.  Seizures Seizure activity on arrival: no   Seizure type:  Unable to specify Preceding symptoms comment:  All pt states is that he had seizures today but has no other complaints Initial focality:  Unable to specify Return to baseline: yes   Severity:  Unable to specify Timing:  Intermittent Number of seizures this episode:  2 sz today Progression:  Resolved Context: decreased sleep   Context: not alcohol withdrawal, not change in medication and not drug use   Recent head injury:  No recent head injuries PTA treatment:  None History of seizures: yes   Date of most recent prior episode:  6 months ago Severity:  Moderate Seizure control level:  Well controlled Current therapy:  Levetiracetam Compliance with current therapy:  Good   Past Medical History  Diagnosis Date  . Hypertension 05/18/2007  . COPD 05/18/2007  . Elevated PSA 05/25/2007  . Prostate cancer   . Seizures 12/05/2013; 12/08/2013; 12/11/2013    new onset; recurrent/notes 12/12/2013  . 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 Stroke 12/05/2012  . Cerebral embolism with cerebral infarction     residual left sided weakness/notes 12/12/2013  . Protein-calorie malnutrition, severe     [name] 12/12/2013  . Chronic kidney disease (CKD), stage II (mild)     [name] 12/12/2013  . Colon polyps 10/20/2013    TUBULAR ADENOMA (4).   Past Surgical History  Procedure Laterality Date  . 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 Multiple extractions with alveoloplasty N/A 10/23/2013    Procedure: Extraction of tooth #'s  1,2,3,4,5,6,7,11,12,13,14,15,16,20,21,22,23,24,27,28,29,32 with alveoloplasty and bilateral maxillary tuberosity reductions;  Surgeon: Lenn Cal, DDS;  Location: [facility];  Service: Oral Surgery;  Laterality: N/A;  . Tibia fracture surgery Right     pin  . Tonsillectomy    . Colonoscopy w/ biopsies  2015   Family History  Problem Relation Age of Onset  . Liver disease Mother   . Heart attack Mother 33  . Colon cancer Neg Hx    History  Substance Use Topics  . Smoking status: Current Some Day Smoker -- 0.40 packs/day for 50 years    Types: Cigarettes  . Smokeless tobacco: Never Used     Comment: 1/2 ppd hx, now 3-4 cigs/day. Form given 05-14-14  . Alcohol Use: 0.0 oz/week    0 Cans of beer per week    Review of Systems  Neurological: Positive for seizures.  All other systems reviewed and are negative.     Allergies  Aspirin; Penicillins; and Other  Home Medications   Prior to Admission medications   Medication Sig Start Date End Date Taking? Authorizing Provider  acetaminophen (TYLENOL) 325 MG tablet Take 1-2 tablets (325-650 mg total) by mouth every 4 (four) hours as needed for mild pain. 02/07/14   Bary Leriche, PA-C  albuterol (PROVENTIL HFA;VENTOLIN HFA) 108 (90 BASE) MCG/ACT inhaler Inhale 2 puffs into the lungs every 6 (six) hours as needed for wheezing. 10/10/13   Corky Sox, MD  atorvastatin (LIPITOR) 80 MG tablet Take 1 tablet (80 mg total) by mouth  daily. 04/04/14   Corky Sox, MD  chlorhexidine (PERIDEX) 0.12 % solution Use as directed 15 mLs in the mouth or throat 2 (two) times daily. Use after breakfast and at bedtime:  Rinse and spit out excess, do not swallow    Historical Provider, MD  clopidogrel (PLAVIX) 75 MG tablet Take 1 tablet (75 mg total) by mouth daily with breakfast. 05/24/14   Corky Sox, MD  diclofenac sodium (VOLTAREN) 1 % GEL Apply 2 g topically 4 (four) times daily. To left knee 02/07/14   Ivan Anchors Love, PA-C  ferrous sulfate 325 (65 FE) MG  tablet Take 1 tablet (325 mg total) by mouth 2 (two) times daily with a meal. 02/07/14   Ivan Anchors Love, PA-C  folic acid (FOLVITE) 1 MG tablet Take 1 tablet (1 mg total) by mouth daily. 02/07/14   Bary Leriche, PA-C  hydrochlorothiazide (HYDRODIURIL) 25 MG tablet Take 1 tablet by mouth daily 05/28/14   Corky Sox, MD  levETIRAcetam (KEPPRA) 750 MG tablet TAKE 2 TABLETS BY MOUTH TWICE DAILY 05/24/14   Corky Sox, MD  lisinopril (PRINIVIL,ZESTRIL) 10 MG tablet Take 0.5 tablets (5 mg total) by mouth 2 (two) times daily. 02/07/14   Bary Leriche, PA-C  Multiple Vitamin (MULTIVITAMIN WITH MINERALS) TABS tablet Take 1 tablet by mouth daily.    Historical Provider, MD  nicotine (NICODERM CQ - DOSED IN MG/24 HOURS) 14 mg/24hr patch Place 1 patch (14 mg total) onto the skin daily. 04/10/14   Corky Sox, MD  pantoprazole (PROTONIX) 40 MG tablet Take 1 tablet (40 mg total) by mouth daily. 05/24/14   Corky Sox, MD  tiotropium (SPIRIVA HANDIHALER) 18 MCG inhalation capsule Place 1 capsule (18 mcg total) into inhaler and inhale daily. 04/04/14 04/03/15  Corky Sox, MD   BP 102/75  Pulse 79  Temp(Src) 98.1 F (36.7 C) (Oral)  Resp 15  SpO2 100% Physical Exam  Nursing note and vitals reviewed. Constitutional: He is oriented to person, place, and time. He appears well-developed and well-nourished. No distress.  HENT:  Head: Normocephalic and atraumatic.  Mouth/Throat: Oropharynx is clear and moist.  Eyes: Conjunctivae and EOM are normal. Pupils are equal, round, and reactive to light.  Neck: Normal range of motion. Neck supple.  Cardiovascular: Normal rate, regular rhythm and intact distal pulses.   No murmur heard. Pulmonary/Chest: Effort normal and breath sounds normal. No respiratory distress. He has no wheezes. He has no rales.  Abdominal: Soft. He exhibits no distension. There is no tenderness. There is no rebound and no guarding.  Musculoskeletal: Normal range of motion. He exhibits no edema and  no tenderness.  Neurological: He is alert and oriented to person, place, and time.  Skin: Skin is warm and dry. No rash noted. No erythema.  Psychiatric: He has a normal mood and affect. His behavior is normal.    ED Course  Procedures (including critical care time) Labs Review Labs Reviewed  CBC WITH DIFFERENTIAL - Abnormal; Notable for the following:    RBC 2.94 (*)    Hemoglobin 6.7 (*)    HCT 21.8 (*)    MCV 74.1 (*)    MCH 22.8 (*)    RDW 16.3 (*)    All other components within normal limits  HEMOGLOBIN AND HEMATOCRIT, BLOOD - Abnormal; Notable for the following:    Hemoglobin 6.5 (*)    HCT 21.6 (*)    All other components within normal limits  BASIC  METABOLIC PANEL - Abnormal; Notable for the following:    Glucose, Bld 108 (*)    BUN 25 (*)    Creatinine, Ser 2.09 (*)    GFR calc non Af Amer 32 (*)    GFR calc Af Amer 37 (*)    All other components within normal limits  I-STAT CHEM 8, ED - Abnormal; Notable for the following:    BUN 25 (*)    Creatinine, Ser 2.20 (*)    Glucose, Bld 103 (*)    Calcium, Ion 1.33 (*)    Hemoglobin 8.5 (*)    HCT 25.0 (*)    All other components within normal limits  POC OCCULT BLOOD, ED - Abnormal; Notable for the following:    Fecal Occult Bld POSITIVE (*)    All other components within normal limits  URINALYSIS, ROUTINE W REFLEX MICROSCOPIC  OCCULT BLOOD X 1 CARD TO LAB, STOOL  TYPE AND SCREEN  PREPARE RBC (CROSSMATCH)    Imaging Review No results found.   EKG Interpretation   Date/Time:  Sunday June 03 2014 12:42:27 EDT Ventricular Rate:  81 PR Interval:  203 QRS Duration: 119 QT Interval:  400 QTC Calculation: 464 R Axis:   -21 Text Interpretation:  Sinus rhythm Incomplete right bundle branch block  Anteroseptal infarct, age indeterminate No significant change since last  tracing Confirmed by [facility]  MD, Loree Fee ([id number]) on 06/03/2014 1:45:40  PM      MDM   Final diagnoses:  Gastrointestinal  hemorrhage, unspecified gastritis, unspecified gastrointestinal hemorrhage type  Anemia, unspecified anemia type    Patient with a history of seizures on Keppra and anemia presents today with a possible history of seizures. There is no family present to corroborate patient's story. All he tells me is he had a seizure. He has no complaints currently and states he is taking all of his normal medications. However today is found to have a hemoglobin of 6.5 and new renal failure of 2.2 from a baseline of less than 1. Looking back patient was hospitalized in June for possible seizures and anemia with Hemoccult positive stools. Patient has had a colonoscopy recently showing polyps but no source of acute bleeding. he was scheduled to have an EGD done in early November.  His stool today is Hemoccult positive anemia still currently taking Plavix. He denies any abdominal pain or hematemesis.  Patient typed and screened and transfused 2 units. Feel that these seizures may be syncopal episodes due to acute blood loss anemia. Patient denies any alcohol or NSAID use. Will admit for further care  CRITICAL CARE Performed by: Blanchie Dessert Total critical care time: 30 Critical care time was exclusive of separately billable procedures and treating other patients. Critical care was necessary to treat or prevent imminent or life-threatening deterioration. Critical care was time spent personally by me on the following activities: development of treatment plan with patient and/or surrogate as well as nursing, discussions with consultants, evaluation of patient's response to treatment, examination of patient, obtaining history from patient or surrogate, ordering and performing treatments and interventions, ordering and review of laboratory studies, ordering and review of radiographic studies, pulse oximetry and re-evaluation of patient's condition.   Blanchie Dessert, MD 06/03/14 [phone]

## 2014-06-03 NOTE — H&P (Signed)
Date: 06/03/2014               Patient Name:  [name] MRN: [id number]  DOB: 01-29-1951 Age / Sex: 63 y.o., male   PCP: Corky Sox, MD         Medical Service: Internal Medicine Teaching Service         Attending Physician: Dr. Aldine Contes    First Contact: Dr. Albin Felling Pager: [phone]  Second Contact: Dr. Duwaine Maxin Pager: [phone]       After Hours (After 5p/  First Contact Pager: [phone]  weekends / holidays): Second Contact Pager: [phone]   Chief Complaint: Seizure  History of Present Illness: [name] is a 63yo man w/ PMHx of HTN, COPD, hx of multiple CVAs (2012, 2014, most recent on 12/22/13 with residual left-sided weakness), seizures, and questionable history of metastatic prostate cancer (urology records not confirmed by clinic) who presented to the ED after two seizure-like episodes. Patient states both seizures occurred this morning. Each lasted approximately 2-3 minutes. He states he lost control of his bowels during the first seizure. Patient reports he is taking 4 pills of Keppra every day, 2 in the morning and 2 in the evening. He does not know how many mg are in each pill. Per med rec, patient is supposed to be taking 1,500 mg BID, each pill is 500 mg.  Patient reports his last seizure was earlier this year. Per chart review, patient was hospitalized in May 2015 for seizures due to medication noncompliance. Patient was scheduled to follow up with Neurology but never did. Patient reports he has not drank alcohol since his last admission this May. He denies chest pain, nausea, vomiting, dizziness, palpitations, and abdominal pain.  In the ED, patient was found to have a Hbg 6.5. He was typed and screened and given 2 units PRBCs. Patient denies dark stools, blood in the stool, hematuria, and nosebleeds. 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 He has been FOBT positive in the past and was FOBT positive this admission as well. He  had a colonoscopy in March 2015 which showed 4 polyps which were all removed at that time.   Meds: Current Facility-Administered Medications  Medication Dose Route Frequency Provider Last Rate Last Dose  . pantoprazole (PROTONIX) injection 40 mg  40 mg Intravenous Q12H Costin Karlyne Greenspan, MD       Current Outpatient Prescriptions  Medication Sig Dispense Refill  . albuterol (PROVENTIL HFA;VENTOLIN HFA) 108 (90 BASE) MCG/ACT inhaler Inhale 2 puffs into the lungs every 6 (six) hours as needed for wheezing.  8.5 g  3  . atorvastatin (LIPITOR) 80 MG tablet Take 80 mg by mouth at bedtime.      . chlorhexidine (PERIDEX) 0.12 % solution Use as directed 15 mLs in the mouth or throat daily.      . clopidogrel (PLAVIX) 75 MG tablet Take 1 tablet (75 mg total) by mouth daily with breakfast.  90 tablet  1  . diclofenac sodium (VOLTAREN) 1 % GEL Apply 1 application topically 3 (three) times daily as needed (knee pain).      . ferrous sulfate 325 (65 FE) MG tablet Take 325 mg by mouth daily with breakfast.      . folic acid (FOLVITE) 1 MG tablet Take 1 tablet (1 mg total) by mouth daily.  30 tablet  1  . hydrochlorothiazide (HYDRODIURIL) 25 MG tablet Take 25 mg by mouth daily.      Marland Kitchen  levETIRAcetam (KEPPRA) 500 MG tablet Take 1,500 mg by mouth 2 (two) times daily.      Marland Kitchen lisinopril (PRINIVIL,ZESTRIL) 10 MG tablet Take 0.5 tablets (5 mg total) by mouth 2 (two) times daily.  30 tablet  1  . Multiple Vitamin (MULTIVITAMIN WITH MINERALS) TABS tablet Take 1 tablet by mouth daily.      . pantoprazole (PROTONIX) 40 MG tablet Take 1 tablet (40 mg total) by mouth daily.  90 tablet  1  . tiotropium (SPIRIVA HANDIHALER) 18 MCG inhalation capsule Place 1 capsule (18 mcg total) into inhaler and inhale daily.  90 capsule  4    Allergies: Allergies as of 06/03/2014 - Review Complete 06/03/2014  Allergen Reaction Noted  . Aspirin Hives 10/18/2012  . Penicillins Hives 10/18/2012  . Other Other (See Comments) 12/12/2013     Past Medical History  Diagnosis Date  . Hypertension 05/18/2007  . COPD 05/18/2007  . Elevated PSA 05/25/2007  . Prostate cancer   . Seizures 12/05/2013; 12/08/2013; 12/11/2013    new onset; recurrent/notes 12/12/2013  . 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 Stroke 12/05/2012  . Cerebral embolism with cerebral infarction     residual left sided weakness/notes 12/12/2013  . Protein-calorie malnutrition, severe     [name] 12/12/2013  . Chronic kidney disease (CKD), stage II (mild)     [name] 12/12/2013  . Colon polyps 10/20/2013    TUBULAR ADENOMA (4).   Past Surgical History  Procedure Laterality Date  . 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 Multiple extractions with alveoloplasty N/A 10/23/2013    Procedure: Extraction of tooth #'s 1,2,3,4,5,6,7,11,12,13,14,15,16,20,21,22,23,24,27,28,29,32 with alveoloplasty and bilateral maxillary tuberosity reductions;  Surgeon: Lenn Cal, DDS;  Location: [facility];  Service: Oral Surgery;  Laterality: N/A;  . Tibia fracture surgery Right     pin  . Tonsillectomy    . Colonoscopy w/ biopsies  2015   Family History  Problem Relation Age of Onset  . Liver disease Mother   . Heart attack Mother 64  . Colon cancer Neg Hx    History   Social History  . Marital Status: Single    Spouse Name: N/A    Number of Children: 0  . Years of Education: N/A   Occupational History  . Flooring instalation     Retired   Social History Main Topics  . Smoking status: Current Some Day Smoker -- 0.40 packs/day for 50 years    Types: Cigarettes  . Smokeless tobacco: Never Used     Comment: 1/2 ppd hx, now 3-4 cigs/day. Form given 05-14-14  . Alcohol Use: 0.0 oz/week    0 Cans of beer per week  . Drug Use: No     Comment: history of cocaine use  . Sexual Activity: Not Currently   Other Topics Concern  . Not on file   Social History Narrative   Drinks 1/2 pt of wine daily and beer daily (at least 12 oz)   Denies recreational drugs     Smokes 3-4 cigarettes. 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    2 years of college at [employer] per patient           Review of Systems: General: Denies fever, chills, night sweats, changes in weight, changes in appetite HEENT: Denies headaches, ear pain, rhinorrhea, sore throat CV: Denies SOB, orthopnea Pulm: Denies SOB, cough, wheezing GI: See HPI GU:  Denies dysuria, frequency Msk: Denies muscle cramps, joint pains Neuro: Denies weakness, numbness, tingling Skin: Denies rashes, bruising  Physical Exam: Blood pressure 102/60, pulse 80, temperature 98.4 F (36.9 C), temperature source Oral, resp. rate 16, SpO2 100.00%. General: alert, sitting up in bed, NAD HEENT: [place]/AT, EOMI, conjunctiva pale, sclera anicteric, mucus membranes moist CV: distant heart sounds, no m/g/r Pulm: CTA bilaterally, breaths non-labored Abd: BS+, soft, nontender Ext: warm, no edema, distal pulses 1+ Neuro: alert and oriented x 3, flat affect, CNs II-XII intact, strength 5/5 in right extremities, but 3/5 in left extremities  Lab results: Basic Metabolic Panel:  Recent Labs  06/03/14 1326 06/03/14 1403  NA 139 138  K 3.7 3.7  CL 102 101  CO2  --  25  GLUCOSE 103* 108*  BUN 25* 25*  CREATININE 2.20* 2.09*  CALCIUM  --  9.7   Liver Function Tests: No results found for this basename: AST, ALT, ALKPHOS, BILITOT, PROT, ALBUMIN,  in the last 72 hours No results found for this basename: LIPASE, AMYLASE,  in the last 72 hours No results found for this basename: AMMONIA,  in the last 72 hours CBC:  Recent Labs  06/03/14 1308 06/03/14 1326 06/03/14 1403  WBC 6.7  --   --   NEUTROABS 4.4  --   --   HGB 6.7* 8.5* 6.5*  HCT 21.8* 25.0* 21.6*  MCV 74.1*  --   --   PLT 369  --   --    Cardiac Enzymes: No results found for this basename: CKTOTAL, CKMB, CKMBINDEX, TROPONINI,  in the last 72 hours BNP: No results found for this basename: PROBNP,   in the last 72 hours D-Dimer: No results found for this basename: DDIMER,  in the last 72 hours CBG: No results found for this basename: GLUCAP,  in the last 72 hours Hemoglobin A1C: No results found for this basename: HGBA1C,  in the last 72 hours Fasting Lipid Panel: No results found for this basename: CHOL, HDL, LDLCALC, TRIG, CHOLHDL, LDLDIRECT,  in the last 72 hours Thyroid Function Tests: No results found for this basename: TSH, T4TOTAL, FREET4, T3FREE, THYROIDAB,  in the last 72 hours Anemia Panel: No results found for this basename: VITAMINB12, FOLATE, FERRITIN, TIBC, IRON, RETICCTPCT,  in the last 72 hours Coagulation: No results found for this basename: LABPROT, INR,  in the last 72 hours Urine Drug Screen: Drugs of Abuse     Component Value Date/Time   LABOPIA NONE DETECTED 01/22/2014 [place] 10/14/2010 2140   COCAINSCRNUR NONE DETECTED 01/22/2014 1411   COCAINSCRNUR  Value: POSITIVE (NOTE) Result repeated and verified. Sent for confirmatory testing* 10/14/2010 2140   LABBENZ NONE DETECTED 01/22/2014 1411   LABBENZ NEGATIVE 10/14/2010 2140   AMPHETMU NONE DETECTED 01/22/2014 1411   AMPHETMU NEGATIVE 10/14/2010 2140   THCU NONE DETECTED 01/22/2014 1411   LABBARB NONE DETECTED 01/22/2014 1411    Alcohol Level: No results found for this basename: ETH,  in the last 72 hours Urinalysis: No results found for this basename: COLORURINE, APPERANCEUR, LABSPEC, [place], GLUCOSEU, HGBUR, BILIRUBINUR, KETONESUR, PROTEINUR, UROBILINOGEN, NITRITE, LEUKOCYTESUR,  in the last 72 hours   Imaging results:  No results found.  Other results: EKG: sinus rhythm, incomplete RBBB  Assessment & Plan: Mr. Janek is a 63yo man w/ PMHx of HTN, COPD, hx of multiple CVAs (2012, 2014, most recent on 12/22/13 with residual left-sided weakness), seizures, and questionable history of metastatic prostate cancer (urology records not confirmed  by clinic) who presented to the ED after two seizures and  found to have a GI bleed.   1. Seizures 2/2 Medication Noncompliance: Patient presented to the ED after having two seizures this morning. Patient reports he is taking Keppra 2 pills in the morning and 2 pills in the evening which equivocates to a total of 1000 mg BID. Patient is supposed to be taking Keppra 1,500 mg BID. He was hospitalized in May 2015 for seizures due to medication noncompliance as well. Will educate patient on importance of taking correct dosage of Keppra. - Seizure precautions - Keppra 1,500 mg BID - Consult neuro if patient seizes again - Cardiac monitoring  2. GI Bleeding: Patient found to have Hbg 6.5 on admission. He was given 2 units of PRBCs. Patient denies any source of bleeding. He was FOBT positive. He denies melena, hematochezia, hematuria, and nosebleeds. 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 He takes folic acid and iron at home. Will consult GI in AM. Patient is scheduled to have EGD with Dr. Hilarie Fredrickson in early November. - Consult GI in AM - f/u post-transfusion CBC - IVF @ 75 ml/hr - Monitor VS - Hold home Plavix  3. HTN:  BP 98/64 on admission. BP has been stable in 502D-741O systolic. Patient takes HCTZ 25 mg daily and Lisinopril 5 mg BID at home.  - Hold home meds given GI bleed  4. COPD: Patient takes Albuterol PRN and Spiriva daily at home. - Continue home meds  5. Hx of Multiple CVAs: Patient has history of CVAs in 2012, 2014, and most recently in May 2015. Patient has residual left-sided weakness in both upper and lower extremities. Patient takes atorvastatin 80 mg QHS and Plavix 75 mg daily. - Hold home Plavix given GI bleed - Continue home atorvastatin  Diet: NPO DVT/PE PPx: SCDs Dispo: Disposition is deferred at this time, awaiting improvement of current medical problems. Anticipated discharge in approximately 1-2 day(s).   The patient does have a current PCP Corky Sox, MD) and does need an [facility] hospital follow-up  appointment after discharge.  The patient does not have transportation limitations that hinder transportation to clinic appointments.  Signed: Albin Felling, MD 06/03/2014, 5:03 PM

## 2014-06-04 ENCOUNTER — Encounter (HOSPITAL_COMMUNITY): Payer: Self-pay | Admitting: Physician Assistant

## 2014-06-04 DIAGNOSIS — I10 Essential (primary) hypertension: Secondary | ICD-10-CM

## 2014-06-04 DIAGNOSIS — Z9119 Patient's noncompliance with other medical treatment and regimen: Secondary | ICD-10-CM

## 2014-06-04 DIAGNOSIS — Z8673 Personal history of transient ischemic attack (TIA), and cerebral infarction without residual deficits: Secondary | ICD-10-CM

## 2014-06-04 DIAGNOSIS — N179 Acute kidney failure, unspecified: Secondary | ICD-10-CM

## 2014-06-04 DIAGNOSIS — R195 Other fecal abnormalities: Secondary | ICD-10-CM

## 2014-06-04 DIAGNOSIS — R569 Unspecified convulsions: Secondary | ICD-10-CM

## 2014-06-04 DIAGNOSIS — D509 Iron deficiency anemia, unspecified: Secondary | ICD-10-CM

## 2014-06-04 DIAGNOSIS — D638 Anemia in other chronic diseases classified elsewhere: Secondary | ICD-10-CM

## 2014-06-04 DIAGNOSIS — D649 Anemia, unspecified: Secondary | ICD-10-CM

## 2014-06-04 DIAGNOSIS — K922 Gastrointestinal hemorrhage, unspecified: Secondary | ICD-10-CM

## 2014-06-04 DIAGNOSIS — J449 Chronic obstructive pulmonary disease, unspecified: Secondary | ICD-10-CM

## 2014-06-04 LAB — COMPREHENSIVE METABOLIC PANEL
ALT: 18 U/L (ref 0–53)
ANION GAP: 15 (ref 5–15)
AST: 24 U/L (ref 0–37)
Albumin: 3.2 g/dL — ABNORMAL LOW (ref 3.5–5.2)
Alkaline Phosphatase: 74 U/L (ref 39–117)
BILIRUBIN TOTAL: 0.6 mg/dL (ref 0.3–1.2)
BUN: 25 mg/dL — AB (ref 6–23)
CALCIUM: 9.4 mg/dL (ref 8.4–10.5)
CHLORIDE: 104 meq/L (ref 96–112)
CO2: 20 meq/L (ref 19–32)
Creatinine, Ser: 1.75 mg/dL — ABNORMAL HIGH (ref 0.50–1.35)
GFR calc non Af Amer: 39 mL/min — ABNORMAL LOW (ref >90)
GFR, EST AFRICAN AMERICAN: 45 mL/min — AB (ref >90)
GLUCOSE: 84 mg/dL (ref 70–99)
Potassium: 3.6 mEq/L — ABNORMAL LOW (ref 3.7–5.3)
Sodium: 139 mEq/L (ref 137–147)
Total Protein: 8 g/dL (ref 6.0–8.3)

## 2014-06-04 LAB — CBC
HCT: 29.7 % — ABNORMAL LOW (ref 39.0–52.0)
Hemoglobin: 9.3 g/dL — ABNORMAL LOW (ref 13.0–17.0)
MCH: 24.8 pg — ABNORMAL LOW (ref 26.0–34.0)
MCHC: 31.3 g/dL (ref 30.0–36.0)
MCV: 79.2 fL (ref 78.0–100.0)
Platelets: 296 10*3/uL (ref 150–400)
RBC: 3.75 MIL/uL — AB (ref 4.22–5.81)
RDW: 17.4 % — ABNORMAL HIGH (ref 11.5–15.5)
WBC: 9.3 10*3/uL (ref 4.0–10.5)

## 2014-06-04 LAB — TYPE AND SCREEN
ABO/RH(D): B POS
Antibody Screen: NEGATIVE
UNIT DIVISION: 0
Unit division: 0

## 2014-06-04 LAB — MRSA PCR SCREENING: MRSA BY PCR: POSITIVE — AB

## 2014-06-04 MED ORDER — MUPIROCIN 2 % EX OINT
1.0000 "application " | TOPICAL_OINTMENT | Freq: Two times a day (BID) | CUTANEOUS | Status: DC
  Administered 2014-06-04 – 2014-06-06 (×5): 1 via NASAL
  Filled 2014-06-04: qty 22

## 2014-06-04 MED ORDER — CHLORHEXIDINE GLUCONATE CLOTH 2 % EX PADS
6.0000 | MEDICATED_PAD | Freq: Every day | CUTANEOUS | Status: DC
  Administered 2014-06-04 – 2014-06-06 (×3): 6 via TOPICAL

## 2014-06-04 MED ORDER — INFLUENZA VAC SPLIT QUAD 0.5 ML IM SUSY
0.5000 mL | PREFILLED_SYRINGE | INTRAMUSCULAR | Status: DC
  Filled 2014-06-04: qty 0.5

## 2014-06-04 MED ORDER — PNEUMOCOCCAL VAC POLYVALENT 25 MCG/0.5ML IJ INJ
0.5000 mL | INJECTION | INTRAMUSCULAR | Status: DC
  Filled 2014-06-04: qty 0.5

## 2014-06-04 NOTE — Progress Notes (Signed)
INITIAL NUTRITION ASSESSMENT  DOCUMENTATION CODES Per approved criteria  -Severe malnutrition in the context of chronic illness  Pt meets criteria for severe MALNUTRITION in the context of chronic illness as evidenced by severe fat and muscle wasting.  INTERVENTION: - Once diet advanced, add Ensure Complete po TID, each supplement provides 350 kcal and 13 grams of protein - RD will continue to monitor.  NUTRITION DIAGNOSIS: Inadequate oral intake related to inability to eat as evidenced by NPO.   Goal: Pt to meet >/= 90% of their estimated nutrition needs   Monitor:  Weight trend, diet advancement, labs, I/O's  Reason for Assessment: Malnutrition Screening Tool  63 y.o. male  Admitting Dx: <principal problem not specified>  ASSESSMENT: [name] is a 63 y.o. male. Hx seizure disorder, hypertension, COPD, recurrent CVAs, and metastatic prostate cancer, CKD, anemia. Takes chronic Plavix. Admitted yesterday after suffering 2 seizures at home.  - Pt has history of PEG tube.  - Pt reports that he was eating normally prior to admission. He says that he has lost weight recently (5-7 lbs). Pt reports feeling hungry today. Currently is NPO, if no EGD today, will be given soft diet. Pt follows a soft diet at home as he has not teeth and has trouble chewing. He says that he likes Ensure Complete.   Nutrition Focused Physical Exam:  Subcutaneous Fat:  Orbital Region: severe depletion Upper Arm Region: severe depletion Thoracic and Lumbar Region: severe depletion  Muscle:  Temple Region: severe depletion Clavicle Bone Region: severe depletion Clavicle and Acromion Bone Region: severe depletion Scapular Bone Region: moderate depletion Dorsal Hand: moderate to severe depletion Patellar Region: moderate depletion Anterior Thigh Region: moderate to severe depletion Posterior Calf Region: moderate depletion  Edema: none    Labs: K low Na WNL BUN elevated Albumin  low  Height: Ht Readings from Last 1 Encounters:  06/03/14 5\' 8"  (1.727 m)    Weight: Wt Readings from Last 1 Encounters:  06/03/14 140 lb 3.4 oz (63.6 kg)    Ideal Body Weight: 68.4 kg  % Ideal Body Weight: 93%  Wt Readings from Last 10 Encounters:  06/03/14 140 lb 3.4 oz (63.6 kg)  05/14/14 143 lb 6.4 oz (65.046 kg)  04/10/14 145 lb 8 oz (65.998 kg)  01/23/14 163 lb 14.4 oz (74.345 kg)  01/21/14 150 lb 14.4 oz (68.448 kg)  01/11/14 149 lb (67.586 kg)  12/21/13 154 lb (69.854 kg)  12/14/13 167 lb 1.7 oz (75.8 kg)  12/11/13 153 lb (69.4 kg)  12/01/13 153 lb 3.2 oz (69.491 kg)    Usual Body Weight: 147 lbs  % Usual Body Weight: 95%  BMI:  Body mass index is 21.32 kg/(m^2).  Estimated Nutritional Needs: Kcal: 1750-2000 Protein: 90-110 g Fluid: 2.0 L/day  Skin: intact  Diet Order: NPO  EDUCATION NEEDS: -Education needs addressed   Intake/Output Summary (Last 24 hours) at 06/04/14 1049 Last data filed at 06/04/14 1000  Gross per 24 hour  Intake 2229.25 ml  Output    250 ml  Net 1979.25 ml    Last BM: 10/26   Labs:   Recent Labs Lab 06/03/14 1326 06/03/14 1403 06/04/14 0106  NA 139 138 139  K 3.7 3.7 3.6*  CL 102 101 104  CO2  --  25 20  BUN 25* 25* 25*  CREATININE 2.20* 2.09* 1.75*  CALCIUM  --  9.7 9.4  GLUCOSE 103* 108* 84    CBG (last 3)  No results found for this  basename: GLUCAP,  in the last 72 hours  Scheduled Meds: . sodium chloride   Intravenous Once  . atorvastatin  80 mg Oral QHS  . chlorhexidine  15 mL Mouth/Throat Daily  . Chlorhexidine Gluconate Cloth  6 each Topical Q0600  . ferrous sulfate  325 mg Oral Q breakfast  . folic acid  1 mg Oral Daily  . [START ON 06/05/2014] Influenza vac split quadrivalent PF  0.5 mL Intramuscular Tomorrow-1000  . levETIRAcetam  1,500 mg Oral BID  . mupirocin ointment  1 application Nasal BID  . pantoprazole (PROTONIX) IV  40 mg Intravenous Q12H  . [START ON 06/05/2014] pneumococcal 23  valent vaccine  0.5 mL Intramuscular Tomorrow-1000  . sodium chloride  3 mL Intravenous Q12H  . tiotropium  18 mcg Inhalation Daily    Continuous Infusions: . sodium chloride 75 mL/hr at 06/03/14 2251    Past Medical History  Diagnosis Date  . Hypertension 05/18/2007  . COPD 05/18/2007  . Elevated PSA 05/25/2007  . Prostate cancer   . Seizures 12/05/2013; 12/08/2013; 12/11/2013    new onset; recurrent/notes 12/12/2013  . 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 Cerebral embolism with cerebral infarction     residual left sided weakness/notes 12/12/2013  . Protein-calorie malnutrition, severe     [name] 12/12/2013  . Chronic kidney disease (CKD), stage II (mild)     [name] 12/12/2013  . Colon polyps 10/20/2013    TUBULAR ADENOMA (4).    Past Surgical History  Procedure Laterality Date  . 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 Multiple extractions with alveoloplasty N/A 10/23/2013    Procedure: Extraction of tooth #'s 1,2,3,4,5,6,7,11,12,13,14,15,16,20,21,22,23,24,27,28,29,32 with alveoloplasty and bilateral maxillary tuberosity reductions;  Surgeon: Lenn Cal, DDS;  Location: [facility];  Service: Oral Surgery;  Laterality: N/A;  . Tibia fracture surgery Right     pin  . Tonsillectomy    . Colonoscopy w/ biopsies  2015    4 polyps, tubular adenomas, pan diverticulosis, one lipoma.   . Peg tube placement  12/2012    per IR    Laurette Schimke RD, LDN

## 2014-06-04 NOTE — H&P (Signed)
Pt seen and examined with Dr. Arcelia Jew. I agree with documentation as outlined. Please refer to my note from today for further details.

## 2014-06-04 NOTE — Consult Note (Signed)
[facility] Consult: 10:39 AM 06/04/2014  LOS: 1 day    Referring Provider: Dr Arcelia Jew Primary Care Physician:  Luanne Bras, MD Primary Gastroenterologist:  Dr. Hilarie Fredrickson     Reason for Consultation:  Recurrent anemia.  FOBT + stools.    HPI: [name] is a 63 y.o. male.  Hx seizure disorder, hypertension, COPD, recurrent CVAs, and metastatic prostate cancer, CKD, anemia.  Takes chronic Plavix.   10/2013 screening colonoscopy showed multiple adenomatous colon polyps, lipoma and diverticulosis.  10/5 GI office visit for anemia, acute on chronic.  Had required 2 unit PRBCs in 01/2014 during admit for seizures. Hgb went from 6.5 to 9.4 post transfusion.  Remained stable ~ 9.0 after that. Ferritin was 16.  He was started on Iron in 01/2014.  EGD set up for 06/12/14, 5 days off Plavix.  Hematology referral was suggested.    Admitted yesterday after suffering 2 seizures at home.  Apparently was taking less than Rxd dose of Keppra.  Hgb measured at 6.5, 9.3 after 2 units of blood. No reports of melena or rectal bleeding.  Is FOBT +.  Stools dark on po iron.  No nausea.  No abdominal pain.  Stable every other day BMs, no blood per rectum.  Follows soft diet as he has no teeth and has trouble chewing.  No choking on POs.  Wt now 140 #, was 143 on 10/5.  No further seizures.      Past Medical History  Diagnosis Date  . Hypertension 05/18/2007  . COPD 05/18/2007  . Elevated PSA 05/25/2007  . Prostate cancer   . Seizures 12/05/2013; 12/08/2013; 12/11/2013    new onset; recurrent/notes 12/12/2013  . 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 Cerebral embolism with cerebral infarction     residual left sided weakness/notes 12/12/2013  . Protein-calorie malnutrition, severe     [name] 12/12/2013  . Chronic kidney  disease (CKD), stage II (mild)     [name] 12/12/2013  . Colon polyps 10/20/2013    TUBULAR ADENOMA (4).    Past Surgical History  Procedure Laterality Date  . 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 Multiple extractions with alveoloplasty N/A 10/23/2013    Procedure: Extraction of tooth #'s 1,2,3,4,5,6,7,11,12,13,14,15,16,20,21,22,23,24,27,28,29,32 with alveoloplasty and bilateral maxillary tuberosity reductions;  Surgeon: Lenn Cal, DDS;  Location: [facility];  Service: Oral Surgery;  Laterality: N/A;  . Tibia fracture surgery Right     pin  . Tonsillectomy    . Colonoscopy w/ biopsies  2015    4 polyps, tubular adenomas, pan diverticulosis, one lipoma.   . Peg tube placement  12/2012    per IR    Prior to Admission medications   Medication Sig Start Date End Date Taking? Authorizing Provider  albuterol (PROVENTIL HFA;VENTOLIN HFA) 108 (90 BASE) MCG/ACT inhaler Inhale 2 puffs into the lungs every 6 (six) hours as needed for wheezing. 10/10/13  Yes Corky Sox, MD  atorvastatin (LIPITOR) 80 MG tablet Take 80 mg by mouth at bedtime.  Yes Historical Provider, MD  chlorhexidine (PERIDEX) 0.12 % solution Use as directed 15 mLs in the mouth or throat daily.   Yes Historical Provider, MD  clopidogrel (PLAVIX) 75 MG tablet Take 1 tablet (75 mg total) by mouth daily with breakfast. 05/24/14  Yes Corky Sox, MD  diclofenac sodium (VOLTAREN) 1 % GEL Apply 1 application topically 3 (three) times daily as needed (knee pain).   Yes Historical Provider, MD  ferrous sulfate 325 (65 FE) MG tablet Take 325 mg by mouth daily with breakfast.   Yes Historical Provider, MD  folic acid (FOLVITE) 1 MG tablet Take 1 tablet (1 mg total) by mouth daily. 02/07/14  Yes Ivan Anchors Love, PA-C  hydrochlorothiazide (HYDRODIURIL) 25 MG tablet Take 25 mg by mouth daily.   Yes Historical Provider, MD  levETIRAcetam (KEPPRA) 500 MG tablet Take 1,500 mg by mouth 2 (two) times daily.   Yes Historical Provider, MD    lisinopril (PRINIVIL,ZESTRIL) 10 MG tablet Take 0.5 tablets (5 mg total) by mouth 2 (two) times daily. 02/07/14  Yes Ivan Anchors Love, PA-C  Multiple Vitamin (MULTIVITAMIN WITH MINERALS) TABS tablet Take 1 tablet by mouth daily.   Yes Historical Provider, MD  pantoprazole (PROTONIX) 40 MG tablet Take 1 tablet (40 mg total) by mouth daily. 05/24/14  Yes Corky Sox, MD  tiotropium (SPIRIVA HANDIHALER) 18 MCG inhalation capsule Place 1 capsule (18 mcg total) into inhaler and inhale daily. 04/04/14 04/03/15 Yes Corky Sox, MD    Scheduled Meds: . sodium chloride   Intravenous Once  . atorvastatin  80 mg Oral QHS  . chlorhexidine  15 mL Mouth/Throat Daily  . Chlorhexidine Gluconate Cloth  6 each Topical Q0600  . ferrous sulfate  325 mg Oral Q breakfast  . folic acid  1 mg Oral Daily  . [START ON 06/05/2014] Influenza vac split quadrivalent PF  0.5 mL Intramuscular Tomorrow-1000  . levETIRAcetam  1,500 mg Oral BID  . mupirocin ointment  1 application Nasal BID  . pantoprazole (PROTONIX) IV  40 mg Intravenous Q12H  . [START ON 06/05/2014] pneumococcal 23 valent vaccine  0.5 mL Intramuscular Tomorrow-1000  . sodium chloride  3 mL Intravenous Q12H  . tiotropium  18 mcg Inhalation Daily   Infusions: . sodium chloride 75 mL/hr at 06/03/14 2251   PRN Meds: albuterol, diclofenac sodium   Allergies as of 06/03/2014 - Review Complete 06/03/2014  Allergen Reaction Noted  . Aspirin Hives 10/18/2012  . Penicillins Hives 10/18/2012  . Other Other (See Comments) 12/12/2013    Family History  Problem Relation Age of Onset  . Liver disease Mother   . Heart attack Mother 16  . Colon cancer Neg Hx     History   Social History  . Marital Status: Single    Spouse Name: N/A    Number of Children: 0  . Years of Education: N/A   Occupational History  . Flooring instalation     Retired   Social History Main Topics  . Smoking status: Current Some Day Smoker -- 0.40 packs/day for 50 years     Types: Cigarettes  . Smokeless tobacco: Never Used     Comment: 1/2 ppd hx, now 3-4 cigs/day. Form given 05-14-14  . Alcohol Use: 0.0 oz/week    0 Cans of beer per week  . Drug Use: No     Comment: history of cocaine use  . Sexual Activity: Not Currently   Other Topics Concern  . Not on file  Social History Narrative   Used to drink 1/2 pt of wine daily and beer daily (at least 12 oz).  as of 06/03/14 denies recent ETOH.    Denies recreational drugs but tox screen + for cocaine in past.     Smokes 3-4 cigarettes. 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    2 years of college at [employer] per patient                 REVIEW OF SYSTEMS: Constitutional:  Wheelchair mobility.  No falls.  ENT:  No nose bleeds Pulm:  No SOB.  + productive cough CV:  No palpitations, no LE edema.  No chest pain.  GU:  No hematuria, + hesitancy.  No blood in urine GI:  Per HPI Heme:  Per HPI   Transfusions:  Per HPI Neuro:  No headaches, no peripheral tingling or numbness Derm:  No itching, no rash or sores.  Endocrine:  No sweats or chills.  No polyuria or dysuria Immunization:  Flu shot current for 2015/16 flu season.  Travel:  None beyond local counties in last few months.    PHYSICAL EXAM: Vital signs in last 24 hours: Filed Vitals:   06/04/14 0806  BP: 114/68  Pulse: 68  Temp: 98.2 F (36.8 C)  Resp: 19   Wt Readings from Last 3 Encounters:  06/03/14 140 lb 3.4 oz (63.6 kg)  05/14/14 143 lb 6.4 oz (65.046 kg)  04/10/14 145 lb 8 oz (65.998 kg)   General: frail AAM who looks old for age.  Conversant and comfortable Head:  No assymetry, no swelling  Eyes:  No icterus or pallor.   Ears:  Slightly HOH  Nose:  No congestion or discharge Mouth:  Edentulous, no lesions.  Moist MM. Neck:  No mass, no JV, no TMG Lungs:  Clear bil.  But reduced BS.  + cough Heart: RRR.  No MRG Abdomen:  Soft, NT, ND, no mass, no HSM, no bruit.  Old peg  scar in left UQ.   Rectal: deferred   Musc/Skeltl: prominent, non-tender bursa on left knee.  (carpet layer in past) Extremities:  No CCE.  Some wasting of muscles in hands  Neurologic:  Oriented x 3.  No tremor.  Limb strength not tested Skin:  No rash or sores Tattoos:  none Nodes:  No cervical adenopathy.    Psych:  Pleasant, cooperative, easily aroused but laconic.   Intake/Output from previous day: 10/25 0701 - 10/26 0700 In: 1879.3 [P.O.:240; I.V.:674.3; Blood:965] Out: 250 [Urine:250] Intake/Output this shift: Total I/O In: 200 [I.V.:200] Out: -   LAB RESULTS:  Recent Labs  06/03/14 1308 06/03/14 1326 06/03/14 1403 06/04/14 0106  WBC 6.7  --   --  9.3  HGB 6.7* 8.5* 6.5* 9.3*  HCT 21.8* 25.0* 21.6* 29.7*  PLT 369  --   --  296  MCV    74                BMET Lab Results  Component Value Date   NA 139 06/04/2014   NA 138 06/03/2014   NA 139 06/03/2014   K 3.6* 06/04/2014   K 3.7 06/03/2014   K 3.7 06/03/2014   CL 104 06/04/2014   CL 101 06/03/2014   CL 102 06/03/2014   CO2 20 06/04/2014   CO2 25 06/03/2014   CO2 26 02/01/2014   GLUCOSE 84 06/04/2014   GLUCOSE 108* 06/03/2014  GLUCOSE 103* 06/03/2014   BUN 25* 06/04/2014   BUN 25* 06/03/2014   BUN 25* 06/03/2014   CREATININE 1.75* 06/04/2014   CREATININE 2.09* 06/03/2014   CREATININE 2.20* 06/03/2014   CALCIUM 9.4 06/04/2014   CALCIUM 9.7 06/03/2014   CALCIUM 9.6 02/01/2014   LFT  Recent Labs  06/04/14 0106  PROT 8.0  ALBUMIN 3.2*  AST 24  ALT 18  ALKPHOS 74  BILITOT 0.6   PT/INR Lab Results  Component Value Date   INR 1.07 11/23/2013   INR 1.04 12/21/2012   INR 0.99 12/05/2012    RADIOLOGY STUDIES: No results found.  ENDOSCOPIC STUDIES: Per HPI No previous EGD.   IMPRESSION:   *  FOBT +.  10/2013 screening Colonoscopy, Dr Hilarie Fredrickson. 4 polyps removed (all tubular adenomas), one lipoma and pan diverticulosis noted.   *  Acute on chronic anemia. Microcytic. Hgb of 6.5 c/w baseline  of 9.0.  Excellent response to PRBCs x 2.  Transfused in 01/2014.   *  Seizure disorder, active seizures on 10/25 a/w pt taking less than RXd dose of Keppra.   *  Chronic Plavix for hx numerous CVAs.   *  Previous PEG placement 12/2012.   *  CKD.  GFR 37 to 45.  May be contributing to his anemia.    PLAN:     *  EGD,? Timing given recent Plavix (last dose 10/24 vs 10/25).  Pt can consent for procedure himself. If no EGD today will start soft diet.  Keep HOB elevated at > 30 degrees as he has a cough and hx CVA and dysphagia.  Clinically looks at risk for aspiration .    Azucena Freed  06/04/2014, 10:39 AM Pager: [phone]   [facility] Attending  I have also seen and assessed the patient and agree with the above note. EGD reasonable. Will wait 1-2 days w/o Sz. Possibly scope Wed - suspect will be able to.  Gatha Mayer, MD, [facility] [phone] (pager) 06/04/2014 4:21 PM

## 2014-06-04 NOTE — Care Management Note (Unsigned)
    Page 1 of 1   06/06/2014     11:16:10 AM CARE MANAGEMENT NOTE 06/06/2014  Patient:  [name], [name]   Account Number:  [id number]  Date Initiated:  06/04/2014  Documentation initiated by:  GRAVES-BIGELOW,BRENDA  Subjective/Objective Assessment:   Pt admitted for Seizures 2/2 Medication Noncompliance. Found to have decreased hgb. Transfused 2 units PRBC.     Action/Plan:   CM will continue to monitor for disposition needs. Pt may benefit from [facility] for medication management.   Anticipated DC Date:  06/06/2014   Anticipated DC Plan:  [place]  CM consult      Choice offered to / List presented to:             Status of service:  In process, will continue to follow Medicare Important Message given?  YES (If response is "NO", the following Medicare IM given date fields will be blank) Date Medicare IM given:  06/06/2014 Medicare IM given by:  Tomi Bamberger Date Additional Medicare IM given:   Additional Medicare IM given by:    Discharge Disposition:    Per UR Regulation:  Reviewed for med. necessity/level of care/duration of stay  If discussed at [place] of Stay Meetings, dates discussed:    Comments:

## 2014-06-04 NOTE — Progress Notes (Signed)
Subjective: No acute events overnight. Sleepy this am but stated that he was feeling fine.   Objective: Vital signs in last 24 hours: Filed Vitals:   06/03/14 2247 06/04/14 0000 06/04/14 0400 06/04/14 0806  BP: 136/80 129/72 122/63 114/68  Pulse: 74 71 79 68  Temp: 99.1 F (37.3 C) 99.1 F (37.3 C) 98.5 F (36.9 C) 98.2 F (36.8 C)  TempSrc: Oral Oral Oral Oral  Resp: 16 14 15 19   Height:      Weight:      SpO2: 97% 98% 98% 96%   Weight change:   Intake/Output Summary (Last 24 hours) at 06/04/14 1013 Last data filed at 06/04/14 0800  Gross per 24 hour  Intake 2079.25 ml  Output    250 ml  Net 1829.25 ml   BP 114/68  Pulse 68  Temp(Src) 98.2 F (36.8 C) (Oral)  Resp 19  Ht 5\' 8"  (1.727 m)  Wt 63.6 kg (140 lb 3.4 oz)  BMI 21.32 kg/m2  SpO2 96%    Physical Exam: Blood pressure 102/60, pulse 80, temperature 98.4 F (36.9 C), temperature source Oral, resp. rate 16, SpO2 100.00%.  General: tired, cachectic appearance HEENT: [place]/AT, EOMI, conjunctiva pale, sclera anicteric, mucus membranes moist  CV: distant heart sounds, no m/g/r  Pulm: CTA bilaterally, breaths non-labored  Abd: BS+, soft, nontender  Ext: warm, no edema, distal pulses 1+  Neuro: alert and oriented x 3, flat affect, able to move extremities  Lab Results: Basic Metabolic Panel:  Recent Labs  06/03/14 1403 06/04/14 0106  NA 138 139  K 3.7 3.6*  CL 101 104  CO2 25 20  GLUCOSE 108* 84  BUN 25* 25*  CREATININE 2.09* 1.75*  CALCIUM 9.7 9.4   Liver Function Tests:  Recent Labs  06/04/14 0106  AST 24  ALT 18  ALKPHOS 74  BILITOT 0.6  PROT 8.0  ALBUMIN 3.2*   No results found for this basename: LIPASE, AMYLASE,  in the last 72 hours No results found for this basename: AMMONIA,  in the last 72 hours CBC:  Recent Labs  06/03/14 1308  06/03/14 1403 06/04/14 0106  WBC 6.7  --   --  9.3  NEUTROABS 4.4  --   --   --   HGB 6.7*  < > 6.5* 9.3*  HCT 21.8*  < > 21.6* 29.7*  MCV 74.1*   --   --  79.2  PLT 369  --   --  296  < > = values in this interval not displayed. Drugs of Abuse     Component Value Date/Time   LABOPIA NONE DETECTED 01/22/2014 1411   LABOPIA NEGATIVE 10/14/2010 2140   COCAINSCRNUR NONE DETECTED 01/22/2014 1411   COCAINSCRNUR  Value: POSITIVE (NOTE) Result repeated and verified. Sent for confirmatory testing* 10/14/2010 2140   LABBENZ NONE DETECTED 01/22/2014 1411   LABBENZ NEGATIVE 10/14/2010 2140   AMPHETMU NONE DETECTED 01/22/2014 1411   AMPHETMU NEGATIVE 10/14/2010 2140   THCU NONE DETECTED 01/22/2014 1411   LABBARB NONE DETECTED 01/22/2014 1411    Alcohol Level: No results found for this basename: ETH,  in the last 72 hours Urinalysis:  Recent Labs  06/03/14 1701  COLORURINE AMBER*  LABSPEC 1.022  PHURINE 5.0  GLUCOSEU NEGATIVE  HGBUR MODERATE*  BILIRUBINUR SMALL*  KETONESUR 15*  PROTEINUR 30*  UROBILINOGEN 0.2  NITRITE NEGATIVE  LEUKOCYTESUR NEGATIVE   Misc. Labs: POC fecal occult blood: Positive   Micro Results: Recent Results (from the past 240  hour(s))  MRSA PCR SCREENING     Status: Abnormal   Collection Time    06/04/14  5:37 AM      Result Value Ref Range Status   MRSA by PCR POSITIVE (*) NEGATIVE Final   Comment:            The GeneXpert MRSA Assay (FDA     approved for NASAL specimens     only), is one component of a     comprehensive MRSA colonization     surveillance program. It is not     intended to diagnose MRSA     infection nor to guide or     monitor treatment for     MRSA infections.     RESULT CALLED TO, READ BACK BY AND VERIFIED WITH:     C.SCHILLER,RN 06/04/14 0716 BY BSLADE   Studies/Results: No results found. Medications: I have reviewed the patient's current medications. Scheduled Meds: . sodium chloride   Intravenous Once  . atorvastatin  80 mg Oral QHS  . chlorhexidine  15 mL Mouth/Throat Daily  . Chlorhexidine Gluconate Cloth  6 each Topical Q0600  . ferrous sulfate  325 mg Oral Q breakfast  .  folic acid  1 mg Oral Daily  . [START ON 06/05/2014] Influenza vac split quadrivalent PF  0.5 mL Intramuscular Tomorrow-1000  . levETIRAcetam  1,500 mg Oral BID  . mupirocin ointment  1 application Nasal BID  . pantoprazole (PROTONIX) IV  40 mg Intravenous Q12H  . [START ON 06/05/2014] pneumococcal 23 valent vaccine  0.5 mL Intramuscular Tomorrow-1000  . sodium chloride  3 mL Intravenous Q12H  . tiotropium  18 mcg Inhalation Daily   Continuous Infusions: . sodium chloride 75 mL/hr at 06/03/14 2251   PRN Meds:.albuterol, diclofenac sodium  Assessment/Plan: Mr. 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   1. Seizures 2/2 Medication Noncompliance: Patient presented to the ED after having two seizures this morning. Patient reports he is taking Keppra 2 pills in the morning and 2 pills in the evening which equivocates to a total of 1000 mg BID. Patient is supposed to be taking Keppra 1,500 mg BID. He was hospitalized in May 2015 for seizures due to medication noncompliance as well. Will educate patient on importance of taking correct dosage of Keppra. We will also get an MRI to rule out brain mets from his hx of prostate cancer.  - Seizure precautions  - Keppra 1,500 mg BID  - Consult neuro if patient seizes again  - Cardiac monitoring  - MRI pending --> indication: hx of metastatic prostate cancer, r/o brain mets  2. GI Bleeding: Patient found to have Hbg 6.5 on admission. He was given 2 units of PRBCs. Patient denies any source of bleeding. He was FOBT positive. He denies melena, hematochezia, hematuria, and nosebleeds. 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 He takes folic acid and iron  at home. Will consult GI in AM. Patient is scheduled to have EGD with Dr. Hilarie Fredrickson in early November.  - Post transfusion CBC was 9.3 - IVF @ 75 ml/hr  - Monitor VS  - Hold home Plavix -GI consult pending   3. HTN:  BP 98/64 on admission. BP has been stable in 203T-597C systolic. Patient takes HCTZ 25 mg daily and Lisinopril 5 mg BID at home.  - Hold home meds given GI bleed   4. COPD: Patient takes Albuterol PRN and Spiriva daily at home.  - Continue home meds   5. Hx of Multiple CVAs: Patient has history of CVAs in 2012, 2014, and most recently in May 2015. Patient has residual left-sided weakness in both upper and lower extremities. Patient takes atorvastatin 80 mg QHS and Plavix 75 mg daily.  - Hold home Plavix given GI bleed  - Continue home atorvastatin   Diet: NPO   DVT/PE PPx: SCDs   Dispo: Disposition is deferred at this time, awaiting improvement of current medical problems. Anticipated discharge in approximately 1-2 day(s).    This is a Careers information officer Note.  The care of the patient was discussed with Dr. Lucilla Lame and the assessment and plan formulated with their assistance.  Please see their attached note for official documentation of the daily encounter.   LOS: 1 day   Sherie Don Day, Med Student 06/04/2014, 10:13 AM   I have seen and examined the patient, and reviewed the daily progress note by Andee Poles Day, MS 3 and discussed the care of the patient with them. Please see my progress note from 06/04/2014 for further details regarding assessment and plan.    Signed:  Albin Felling, MD 06/04/2014, 3:34 PM

## 2014-06-04 NOTE — Progress Notes (Signed)
UR Completed Keslee Harrington Graves-Bigelow, RN,BSN [phone]  

## 2014-06-04 NOTE — Progress Notes (Signed)
Pt seen and examined with Dr. Arcelia Jew  In brief, pt is a 63 y/o male with PMH of HTN, COPD, multiple CVAs, seizures and Prostate Cancer (possibly metastatic to T6) p/w seizures * 2 episodes. Pt was feeling well till yesterday when he had 2 episodes of seizures lasting approx 2-3 minutes each with bowel and bladder incontinence. Pt was supposed to be taking 1500 mg bid of Keppra but ahd been taking only 1000 mg bid. He had seizures in May for medication non compliance. Currently he feels well. No cp, no sob, no abd pain, no fevers/chills, no diarrhea, no n/v. Pt was found to have worsening anemia (Hg - 6.5) on admission. Remaining ROS negative  Exam: Gen: AAO*3, NAD CV: RRR, normal heart sounds Pulm: CTA b/l Abd: soft, non tender, BS + Ext: no pedal edema  Assessment and Plan: 63 y/o male with recurrent seizures likely secondary to medication non compliance   Seizures: - Will check MRI brain- r/o mets given h/o prostate cancer and pt is non compliant with Rx - c/w keppra, seizure precautions - Neuro consult if recurrent seizures  Anemia: - GI consult noted- pt for EGD in AM - c/w soft diet for now. - Anemia likely secondary to blood loss (uncertain if acute v/s chronic) v/s chronic disease - Hg improved s/p 2 units PRBC. Will monitor  AKI: - Cr improving on IVF. Will continue for now - Possibly prerenal etiology - Uncertain baseline. Will monitor

## 2014-06-04 NOTE — Progress Notes (Signed)
Subjective: Patient reports he is tired this morning. He denies blood in his stool or dark stools, and hematuria. GI will see him today.  Objective: Vital signs in last 24 hours: Filed Vitals:   06/03/14 2247 06/04/14 0000 06/04/14 0400 06/04/14 0806  BP: 136/80 129/72 122/63 114/68  Pulse: 74 71 79 68  Temp: 99.1 F (37.3 C) 99.1 F (37.3 C) 98.5 F (36.9 C) 98.2 F (36.8 C)  TempSrc: Oral Oral Oral Oral  Resp: 16 14 15 19   Height:      Weight:      SpO2: 97% 98% 98% 96%   Weight change:   Intake/Output Summary (Last 24 hours) at 06/04/14 1159 Last data filed at 06/04/14 1000  Gross per 24 hour  Intake 2229.25 ml  Output    250 ml  Net 1979.25 ml   Physical Exam General: awake, laying in bed, NAD HEENT: [place]/AT, EOMI, conjunctiva pale, mucus membranes moist CV: RRR, normal S1/S2, no m/g/r Pulm: CTA bilaterally, breaths non-labored Abd: BS+, soft, nontender Ext: warm, no edema, moves all Neuro: alert and oriented x 3, CNs II-XII intact, left side weaker compared to right  Lab Results: Basic Metabolic Panel:  Recent Labs Lab 06/03/14 1403 06/04/14 0106  NA 138 139  K 3.7 3.6*  CL 101 104  CO2 25 20  GLUCOSE 108* 84  BUN 25* 25*  CREATININE 2.09* 1.75*  CALCIUM 9.7 9.4   Liver Function Tests:  Recent Labs Lab 06/04/14 0106  AST 24  ALT 18  ALKPHOS 74  BILITOT 0.6  PROT 8.0  ALBUMIN 3.2*   No results found for this basename: LIPASE, AMYLASE,  in the last 168 hours No results found for this basename: AMMONIA,  in the last 168 hours CBC:  Recent Labs Lab 06/03/14 1308  06/03/14 1403 06/04/14 0106  WBC 6.7  --   --  9.3  NEUTROABS 4.4  --   --   --   HGB 6.7*  < > 6.5* 9.3*  HCT 21.8*  < > 21.6* 29.7*  MCV 74.1*  --   --  79.2  PLT 369  --   --  296  < > = values in this interval not displayed. Cardiac Enzymes: No results found for this basename: CKTOTAL, CKMB, CKMBINDEX, TROPONINI,  in the last 168 hours BNP: No results found for this  basename: PROBNP,  in the last 168 hours D-Dimer: No results found for this basename: DDIMER,  in the last 168 hours CBG: No results found for this basename: GLUCAP,  in the last 168 hours Hemoglobin A1C: No results found for this basename: HGBA1C,  in the last 168 hours Fasting Lipid Panel: No results found for this basename: CHOL, HDL, LDLCALC, TRIG, CHOLHDL, LDLDIRECT,  in the last 168 hours Thyroid Function Tests: No results found for this basename: TSH, T4TOTAL, FREET4, T3FREE, THYROIDAB,  in the last 168 hours Coagulation: No results found for this basename: LABPROT, INR,  in the last 168 hours Anemia Panel: No results found for this basename: VITAMINB12, FOLATE, FERRITIN, TIBC, IRON, RETICCTPCT,  in the last 168 hours Urine Drug Screen: Drugs of Abuse     Component Value Date/Time   LABOPIA NONE DETECTED 01/22/2014 [place] 10/14/2010 2140   [place] 01/22/2014 1411   COCAINSCRNUR  Value: POSITIVE (NOTE) Result repeated and verified. Sent for confirmatory testing* 10/14/2010 2140   LABBENZ NONE DETECTED 01/22/2014 1411   LABBENZ NEGATIVE 10/14/2010 2140  AMPHETMU NONE DETECTED 01/22/2014 1411   AMPHETMU NEGATIVE 10/14/2010 2140   THCU NONE DETECTED 01/22/2014 1411   LABBARB NONE DETECTED 01/22/2014 1411    Alcohol Level: No results found for this basename: ETH,  in the last 168 hours Urinalysis:  Recent Labs Lab 06/03/14 1701  COLORURINE AMBER*  LABSPEC 1.022  PHURINE 5.0  GLUCOSEU NEGATIVE  HGBUR MODERATE*  BILIRUBINUR SMALL*  KETONESUR 15*  PROTEINUR 30*  UROBILINOGEN 0.2  NITRITE NEGATIVE  LEUKOCYTESUR NEGATIVE     Micro Results: Recent Results (from the past 240 hour(s))  MRSA PCR SCREENING     Status: Abnormal   Collection Time    06/04/14  5:37 AM      Result Value Ref Range Status   MRSA by PCR POSITIVE (*) NEGATIVE Final   Comment:            The GeneXpert MRSA Assay (FDA     approved for NASAL specimens     only), is one  component of a     comprehensive MRSA colonization     surveillance program. It is not     intended to diagnose MRSA     infection nor to guide or     monitor treatment for     MRSA infections.     RESULT CALLED TO, READ BACK BY AND VERIFIED WITH:     C.SCHILLER,RN 06/04/14 0716 BY BSLADE   Studies/Results: No results found. Medications: I have reviewed the patient's current medications. Scheduled Meds: . sodium chloride   Intravenous Once  . atorvastatin  80 mg Oral QHS  . chlorhexidine  15 mL Mouth/Throat Daily  . Chlorhexidine Gluconate Cloth  6 each Topical Q0600  . ferrous sulfate  325 mg Oral Q breakfast  . folic acid  1 mg Oral Daily  . [START ON 06/05/2014] Influenza vac split quadrivalent PF  0.5 mL Intramuscular Tomorrow-1000  . levETIRAcetam  1,500 mg Oral BID  . mupirocin ointment  1 application Nasal BID  . pantoprazole (PROTONIX) IV  40 mg Intravenous Q12H  . [START ON 06/05/2014] pneumococcal 23 valent vaccine  0.5 mL Intramuscular Tomorrow-1000  . sodium chloride  3 mL Intravenous Q12H  . tiotropium  18 mcg Inhalation Daily   Continuous Infusions: . sodium chloride 75 mL/hr at 06/03/14 2251   PRN Meds:.albuterol, diclofenac sodium Assessment/Plan: Mr. Schiele is a 63yo man w/ PMHx of HTN, COPD, hx of multiple CVAs (2012, 2014, most recent on 12/22/13 with residual left-sided weakness), seizures, and questionable history of metastatic prostate cancer (urology records not confirmed by clinic) who presented to the ED after two seizures and found to have a GI bleed.   1. Seizures 2/2 Medication Noncompliance: No events overnight. Concern that given patient's diagnosis of metastatic prostate cancer that he may have mets to brain which could be cause for seizure. However, medication noncompliance most likely cause for seizure. Records were obtained from patient's urologist, Dr.Nesi, which showed confirmation of metastatic prostate cancer. Patient had prostate biopsy in April  2014 which showed adenocarcinoma of the prostate. His PSA at Feb 2015 visit was 1.23, down from 236 at time of diagnosis. He was started on Lupron, but it is unclear if patient has been taking this medication. He was scheduled to start Xgeva after dental clearance and had follow up appointment in September this year which he missed. A PET scan was done in May 2014 which showed increased uptake in the T6 vertebra, bone mets cannot be excluded. Will contact family  to set up meeting to discuss care. - MRI Brain - Seizure precautions  - Continue Keppra 1,500 mg BID  - Consult neuro if patient seizes again  - Cardiac monitoring   2. GI Bleeding: Post transfusion CBC 9.3 this AM. GI saw patient and may do EGD today, if not tomorrow. Per GI, can be started on soft diet. Patient continues to deny melena, hematochezia, and hematuria.  - GI on board, appreciate recommendations. Likely EGD today, possibly tomorrow.   - Continue to monitor Hbg - IVF @ 75 ml/hr  - Monitor VS  - Hold home Plavix   3. AKI?: Cr 2.09 on admission, improved to 1.75 this morning. Unsure of baseline since has been fluctuating between 1.0-2.0. Will continue to monitor.  4. HTN: BP 98/64 on admission. BP has been stable in 290S-111B systolic. Patient takes HCTZ 25 mg daily and Lisinopril 5 mg BID at home.  - Hold home meds given GI bleed   5. COPD: Patient takes Albuterol PRN and Spiriva daily at home.  - Continue home meds   6. Hx of Multiple CVAs: Patient has history of CVAs in 2012, 2014, and most recently in May 2015. Patient has residual left-sided weakness in both upper and lower extremities. Patient takes atorvastatin 80 mg QHS and Plavix 75 mg daily.  - Hold home Plavix given GI bleed  - Continue home atorvastatin   Diet: NPO  DVT/PE PPx: SCDs  Dispo: Disposition is deferred at this time, awaiting improvement of current medical problems. Anticipated discharge in approximately 1-2 day(s).   The patient does have a  current PCP Corky Sox, MD) and does need an [facility] hospital follow-up appointment after discharge.  The patient does not have transportation limitations that hinder transportation to clinic appointments.  .Services Needed at time of discharge: Y = Yes, Blank = No PT:   OT:   RN:   Equipment:   Other:     LOS: 1 day   Albin Felling, MD 06/04/2014, 11:59 AM

## 2014-06-05 ENCOUNTER — Inpatient Hospital Stay (HOSPITAL_COMMUNITY): Payer: Medicare Other

## 2014-06-05 ENCOUNTER — Encounter (HOSPITAL_COMMUNITY): Payer: Self-pay | Admitting: Dentistry

## 2014-06-05 LAB — BASIC METABOLIC PANEL
ANION GAP: 11 (ref 5–15)
BUN: 15 mg/dL (ref 6–23)
CALCIUM: 9.2 mg/dL (ref 8.4–10.5)
CO2: 22 meq/L (ref 19–32)
Chloride: 107 mEq/L (ref 96–112)
Creatinine, Ser: 1.05 mg/dL (ref 0.50–1.35)
GFR calc Af Amer: 84 mL/min — ABNORMAL LOW (ref >90)
GFR calc non Af Amer: 73 mL/min — ABNORMAL LOW (ref >90)
Glucose, Bld: 83 mg/dL (ref 70–99)
Potassium: 3.8 mEq/L (ref 3.7–5.3)
SODIUM: 140 meq/L (ref 137–147)

## 2014-06-05 LAB — CBC
HCT: 26.3 % — ABNORMAL LOW (ref 39.0–52.0)
HEMOGLOBIN: 8.4 g/dL — AB (ref 13.0–17.0)
MCH: 24.6 pg — ABNORMAL LOW (ref 26.0–34.0)
MCHC: 31.9 g/dL (ref 30.0–36.0)
MCV: 77.1 fL — ABNORMAL LOW (ref 78.0–100.0)
PLATELETS: 285 10*3/uL (ref 150–400)
RBC: 3.41 MIL/uL — AB (ref 4.22–5.81)
RDW: 16.9 % — ABNORMAL HIGH (ref 11.5–15.5)
WBC: 6.4 10*3/uL (ref 4.0–10.5)

## 2014-06-05 MED ORDER — GADOBENATE DIMEGLUMINE 529 MG/ML IV SOLN
15.0000 mL | Freq: Once | INTRAVENOUS | Status: AC | PRN
  Administered 2014-06-05: 14 mL via INTRAVENOUS

## 2014-06-05 MED ORDER — LISINOPRIL 5 MG PO TABS
5.0000 mg | ORAL_TABLET | Freq: Two times a day (BID) | ORAL | Status: DC
  Administered 2014-06-05 – 2014-06-06 (×3): 5 mg via ORAL
  Filled 2014-06-05 (×4): qty 1

## 2014-06-05 NOTE — Progress Notes (Signed)
Report called to [place], RN on 5W. Will be transferring pt via bed.

## 2014-06-05 NOTE — Progress Notes (Signed)
Subjective: No acute events overnight. Patient was AAOx3 and doing well this am and had a large appetite after being NPO all Pittman yesterday. He denied any suprapubic tenderness or abdominal pain.   Objective: Vital signs in last 24 hours: Filed Vitals:   06/05/14 0800 06/05/14 0832 06/05/14 0908 06/05/14 1140  BP:  129/67  139/71  Pulse: 74 75  69  Temp:  98.7 F (37.1 C)  98.3 F (36.8 C)  TempSrc:  Oral  Oral  Resp: 23 19  18   Height:      Weight:      SpO2:  98% 96% 99%   Weight change:   Intake/Output Summary (Last 24 hours) at 06/05/14 1154 Last data filed at 06/05/14 1000  Gross per 24 hour  Intake   1785 ml  Output    600 ml  Net   1185 ml   BP 139/71  Pulse 69  Temp(Src) 98.3 F (36.8 C) (Oral)  Resp 18  Ht 5\' 8"  (1.727 m)  Wt 63.6 kg (140 lb 3.4 oz)  BMI 21.32 kg/m2  SpO2 99%    Physical Exam: Blood pressure 102/60, pulse 80, temperature 98.4 F (36.9 C), temperature source Oral, resp. rate 16, SpO2 100.00%.  General: tired, cachectic appearance HEENT: [place]/AT, EOMI, conjunctiva pale, sclera anicteric, mucus membranes moist  CV: distant heart sounds, no m/g/r  Pulm: CTA bilaterally, breaths non-labored  Abd: BS+, soft, nontender  Ext: warm, no edema, distal pulses 1+  Neuro: alert and oriented x 3, flat affect, able to move extremities  Lab Results: Studies/Results: [name] Wo Contrast  06/05/2014   CLINICAL DATA:  Patient presented to the emergency department on 06/03/2014 with seizures. History of noncompliance with anticonvulsant medication. History of prior stroke. History of prostate cancer.  EXAM: MRI HEAD WITHOUT AND WITH CONTRAST  TECHNIQUE: Multiplanar, multiecho pulse sequences of the brain and surrounding structures were obtained without and with intravenous contrast.  CONTRAST:  76mL MULTIHANCE GADOBENATE DIMEGLUMINE 529 MG/ML IV SOLN  COMPARISON:  MR brain 11/23/2013.  FINDINGS: No evidence for acute infarction, acute hemorrhage, mass  lesion, hydrocephalus, or extra-axial fluid. Pseudoasymmetry of slightly brighter LEFT cerebellar hemisphere on DWI relates to a large area of chronic infarction in the RIGHT cerebellum.  Chronic encephalomalacia of not only the RIGHT cerebellum, but the RIGHT dorsal lateral medulla, and RIGHT medial posterior frontal and parietal lobes related to previous cerebral infarction. Generalized atrophy. Chronic microvascular ischemic change of a moderate to severe nature. Numerous microbleeds related to chronic infarction and hypertensive cerebrovascular disease. Slight postcontrast gyriform enhancement in the medial RIGHT parietal cortex relates to the late subacute time course of the previous infarct demonstrated in April.  Flow voids are maintained. LEFT vertebral is the dominant contributor to the basilar with the RIGHT vertebral primarily supplying PICA. Negative orbits. No acute mastoid fluid. Significant opacification of the LEFT maxillary sinus with medial expansion, possible mucocele or antrochoanal polyp, similar to April 2015  IMPRESSION: Chronic changes as described. No evidence for acute stroke or restricted diffusion related to recent seizure.  Late subacute infarct resolution results in medial posterior frontal and parietal gyriform enhancement.  Chronic changes elsewhere as described.  Chronic opacification LEFT maxillary sinus, possible antrochoanal polyp or mucocele.   Electronically Signed   By: Rolla Flatten M.D.   On: 06/05/2014 11:21   Medications: I have reviewed the patient's current medications. Scheduled Meds: . sodium chloride   Intravenous Once  . atorvastatin  80 mg Oral QHS  .  chlorhexidine  15 mL Mouth/Throat Daily  . Chlorhexidine Gluconate Cloth  6 each Topical Q0600  . ferrous sulfate  325 mg Oral Q breakfast  . folic acid  1 mg Oral Daily  . Influenza vac split quadrivalent PF  0.5 mL Intramuscular Tomorrow-1000  . levETIRAcetam  1,500 mg Oral BID  . lisinopril  5 mg Oral BID    . mupirocin ointment  1 application Nasal BID  . pantoprazole (PROTONIX) IV  40 mg Intravenous Q12H  . pneumococcal 23 valent vaccine  0.5 mL Intramuscular Tomorrow-1000  . sodium chloride  3 mL Intravenous Q12H  . tiotropium  18 mcg Inhalation Daily   Continuous Infusions: . 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 ED after two seizures and was found to have a GI bleed.   1. Seizures 2/2 Medication Noncompliance: Patient presented to the ED after having two seizures this morning. Patient reports he is taking Keppra 2 pills in the morning and 2 pills in the evening which equivocates to a total of 1000 mg BID. Patient is supposed to be taking Keppra 1,500 mg BID. He was hospitalized in May 2015 for seizures due to medication noncompliance as well. Will educate patient on importance of taking correct dosage of Keppra. We will also get an MRI to rule out brain mets from his hx of prostate cancer.  - Seizure precautions  - Keppra 1,500 mg BID  - Consult neuro if patient seizes again  - Cardiac monitoring  - MRI: No evidence for acute infarction, hemorrhage or mass lesion. Chronic changes present: large area of infarction in the right cerebellum, numerous micro bleeds related to chronic hypertension,  Slight postcontrast gyriform enhancement in the medial RIGHT parietal cortex relates to the late subacute time course of the previous infarct demonstrated in April. --> seizure is most likely due to medication noncompliance.   2. GI Bleeding: Patient found to have Hbg 6.5 on admission. He was given 2 units of PRBCs. Patient denies any source of bleeding. He was FOBT  positive. He denies melena, hematochezia, hematuria, and nosebleeds. 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 He takes folic acid and iron at home. Will consult GI in AM. Patient is scheduled to have EGD with Dr. Hilarie Fredrickson in early November.  - Post transfusion CBC was 9.3 - IVF @ 75 ml/hr  - Monitor VS  - Hold home Plavix -GI consult pending   3. HTN: BP 98/64 on admission. BP has been stable in 440N-027O systolic. Patient takes HCTZ 25 mg daily and Lisinopril 5 mg BID at home.  - Started back on Lisinopril for hypertension into the 536U systolic O/N   4. COPD: Patient takes Albuterol PRN and Spiriva daily at home.  - Continue home meds   5. Hx of Multiple CVAs: Patient has history of CVAs in 2012, 2014, and most recently in May 2015. Patient has residual left-sided weakness in both upper and lower extremities. Patient takes atorvastatin 80 mg QHS and Plavix 75 mg daily.  - Hold home Plavix given GI bleed  - Continue home atorvastatin   Diet: NPO   DVT/PE PPx: SCDs   Dispo: Disposition is  deferred at this time, awaiting improvement of current medical problems. Anticipated discharge in approximately 1-2 Pittman(s).    This is a Careers information officer Note.  The care of the patient was discussed with Dr. Lucilla Lame and the assessment and plan formulated with their assistance.  Please see their attached note for official documentation of the daily encounter.   LOS: 2 days   [name], Med Student 06/05/2014, 11:54 AM   I have seen and examined the patient, and reviewed the daily progress note by Andee Poles Day, MS 3 and discussed the care of the patient with them. Please see my progress note from 06/05/14 for further details regarding assessment and plan.    Signed:  Albin Felling, MD 06/07/2014, 2:12 PM

## 2014-06-05 NOTE — Progress Notes (Signed)
          Daily Rounding Note  06/05/2014, 9:57 AM  LOS: 2 days   SUBJECTIVE:       Feels well, no complaints, no black stools.  Tolerating solids. No further seizures.  OBJECTIVE:         Vital signs in last 24 hours:    Temp:  [97.5 F (36.4 C)-99.1 F (37.3 C)] 98.7 F (37.1 C) (10/27 0832) Pulse Rate:  [66-76] 75 (10/27 0832) Resp:  [19-24] 19 (10/27 0832) BP: (124-155)/(67-80) 129/67 mmHg (10/27 0832) SpO2:  [96 %-100 %] 96 % (10/27 0908) Last BM Date: 06/04/14 Filed Weights   06/03/14 1823  Weight: 140 lb 3.4 oz (63.6 kg)   General: alert, comfortable   Heart: RRR Chest: clear bil.  Unlabored breathing Abdomen: soft, NT, ND, active BS  Extremities: no CCE.   Neuro/Psych:  Pleasant, oriented x 3.  Relaxed.   Intake/Output from previous day: 10/26 0701 - 10/27 0700 In: 7048 [P.O.:240; I.V.:1475] Out: 600 [Urine:600]  Intake/Output this shift: Total I/O In: 150 [I.V.:150] Out: -   Lab Results:  Recent Labs  06/03/14 1308  06/03/14 1403 06/04/14 0106 06/05/14 0323  WBC 6.7  --   --  9.3 6.4  HGB 6.7*  < > 6.5* 9.3* 8.4*  HCT 21.8*  < > 21.6* 29.7* 26.3*  PLT 369  --   --  296 285  < > = values in this interval not displayed. BMET  Recent Labs  06/03/14 1403 06/04/14 0106 06/05/14 0323  NA 138 139 140  K 3.7 3.6* 3.8  CL 101 104 107  CO2 25 20 22   GLUCOSE 108* 84 83  BUN 25* 25* 15  CREATININE 2.09* 1.75* 1.05  CALCIUM 9.7 9.4 9.2   LFT  Recent Labs  06/04/14 0106  PROT 8.0  ALBUMIN 3.2*  AST 24  ALT 18  ALKPHOS 74  BILITOT 0.6    ASSESMENT:   * FOBT +.  10/2013 screening Colonoscopy, Dr Hilarie Fredrickson. 4 polyps removed (all tubular adenomas), one lipoma and pan diverticulosis noted.  * Acute on chronic anemia. Microcytic. Hgb of 6.5 c/w baseline of 9.0. Excellent response to PRBCs x 2. Transfused in 01/2014.  * Seizure disorder, active seizures on 10/25 a/w pt taking less than RXd dose  of Keppra.  * Chronic Plavix for hx numerous CVAs.  * Previous PEG placement 12/2012.  * CKD. GFR 37 to 45. May be contributing to his anemia.      PLAN   *  EGD set for 10/28 at 11:45     Azucena Freed  06/05/2014, 9:57 AM Pager: [phone]  Agree with Ms. Sandi Carne assessment and plan. Gatha Mayer, MD, [facility]

## 2014-06-05 NOTE — Progress Notes (Signed)
Pt seen and examined with Dr. Arcelia Jew. Please refer to resident note for details  Pt feels well today. No new complaints. Had a BM yesterday. No fevers  Exam:  Gen: AAO*3, NAD  CV: RRR, normal heart sounds  Pulm: CTA b/l  Abd: soft, non tender, BS +  Ext: no pedal edema  Neuro: no focal deficits  Assessment and Plan:  63 y/o male with recurrent seizures likely secondary to medication non compliance   Seizures:  - MRI brain noted- no evidence of metastatic disease but evidence of old infarcts and chronic changes - c/w keppra, seizure precautions  - Neuro consult if recurrent seizures  - can downgrade from step down  Anemia:  - GI consult noted- pt for EGD in AM  - Anemia likely secondary to blood loss (uncertain if acute v/s chronic) - Hg improved s/p 2 units PRBC. Will monitor   AKI:  - Cr normalized now. Will monitor - Possibly prerenal etiology   HTN: - BP stable. Will resume home lisinopril - Hold HCTZ for now  Possible d/c in AM if EGD wnl and CBC stable

## 2014-06-05 NOTE — Plan of Care (Signed)
Problem: Phase II Progression Outcomes Goal: Tolerating diet when awake Outcome: Completed/Met Date Met:  06/05/14 Patient eating meals without adverse effects.

## 2014-06-05 NOTE — Plan of Care (Signed)
Problem: Phase II Progression Outcomes Goal: Pain controlled Outcome: Completed/Met Date Met:  06/05/14 Patient not experiencing pain.     

## 2014-06-05 NOTE — Progress Notes (Signed)
Subjective: Patient much more awake and alert this morning. He states he is feeling well, just tired. He states he had bowel movement yesterday and he does not know if it was dark or had blood. He denies abdominal pain and hematuria. He will have EGD tomorrow. Will get MRI Brain this morning to assess for mets from primary prostate cancer.   Objective: Vital signs in last 24 hours: Filed Vitals:   06/05/14 0750 06/05/14 0800 06/05/14 0832 06/05/14 0908  BP: 124/72  129/67   Pulse: 76 74 75   Temp:   98.7 F (37.1 C)   TempSrc:   Oral   Resp: 19 23 19    Height:      Weight:      SpO2: 99%  98% 96%   Weight change:   Intake/Output Summary (Last 24 hours) at 06/05/14 1100 Last data filed at 06/05/14 1000  Gross per 24 hour  Intake   1665 ml  Output    600 ml  Net   1065 ml   Physical Exam General: alert, sitting up in bed, NAD HEENT: [place]/AT, EOMI, sclera anicteric, mucus membranes moist CV: RRR, normal S1/S2, no m/g/r Pulm: CTA bilaterally, breaths non-labored Abd: BS+, soft, non-tender, non-distended Ext: warm, no edema, moves all Neuro: alert and oriented x 3, CNs II-XII intact  Lab Results: Basic Metabolic Panel:  Recent Labs Lab 06/04/14 0106 06/05/14 0323  NA 139 140  K 3.6* 3.8  CL 104 107  CO2 20 22  GLUCOSE 84 83  BUN 25* 15  CREATININE 1.75* 1.05  CALCIUM 9.4 9.2   Liver Function Tests:  Recent Labs Lab 06/04/14 0106  AST 24  ALT 18  ALKPHOS 74  BILITOT 0.6  PROT 8.0  ALBUMIN 3.2*   No results found for this basename: LIPASE, AMYLASE,  in the last 168 hours No results found for this basename: AMMONIA,  in the last 168 hours CBC:  Recent Labs Lab 06/03/14 1308  06/04/14 0106 06/05/14 0323  WBC 6.7  --  9.3 6.4  NEUTROABS 4.4  --   --   --   HGB 6.7*  < > 9.3* 8.4*  HCT 21.8*  < > 29.7* 26.3*  MCV 74.1*  --  79.2 77.1*  PLT 369  --  296 285  < > = values in this interval not displayed. Cardiac Enzymes: No results found for this  basename: CKTOTAL, CKMB, CKMBINDEX, TROPONINI,  in the last 168 hours BNP: No results found for this basename: PROBNP,  in the last 168 hours D-Dimer: No results found for this basename: DDIMER,  in the last 168 hours CBG: No results found for this basename: GLUCAP,  in the last 168 hours Hemoglobin A1C: No results found for this basename: HGBA1C,  in the last 168 hours Fasting Lipid Panel: No results found for this basename: CHOL, HDL, LDLCALC, TRIG, CHOLHDL, LDLDIRECT,  in the last 168 hours Thyroid Function Tests: No results found for this basename: TSH, T4TOTAL, FREET4, T3FREE, THYROIDAB,  in the last 168 hours Coagulation: No results found for this basename: LABPROT, INR,  in the last 168 hours Anemia Panel: No results found for this basename: VITAMINB12, FOLATE, FERRITIN, TIBC, IRON, RETICCTPCT,  in the last 168 hours Urine Drug Screen: Drugs of Abuse     Component Value Date/Time   LABOPIA NONE DETECTED 01/22/2014 [place] 10/14/2010 2140   [place] 01/22/2014 1411   COCAINSCRNUR  Value: POSITIVE (NOTE) Result repeated  and verified. Sent for confirmatory testing* 10/14/2010 2140   LABBENZ NONE DETECTED 01/22/2014 1411   LABBENZ NEGATIVE 10/14/2010 2140   AMPHETMU NONE DETECTED 01/22/2014 1411   AMPHETMU NEGATIVE 10/14/2010 2140   THCU NONE DETECTED 01/22/2014 1411   LABBARB NONE DETECTED 01/22/2014 1411    Alcohol Level: No results found for this basename: ETH,  in the last 168 hours Urinalysis:  Recent Labs Lab 06/03/14 1701  COLORURINE AMBER*  LABSPEC 1.022  PHURINE 5.0  GLUCOSEU NEGATIVE  HGBUR MODERATE*  BILIRUBINUR SMALL*  KETONESUR 15*  PROTEINUR 30*  UROBILINOGEN 0.2  NITRITE NEGATIVE  LEUKOCYTESUR NEGATIVE     Micro Results: Recent Results (from the past 240 hour(s))  MRSA PCR SCREENING     Status: Abnormal   Collection Time    06/04/14  5:37 AM      Result Value Ref Range Status   MRSA by PCR POSITIVE (*) NEGATIVE Final    Comment:            The GeneXpert MRSA Assay (FDA     approved for NASAL specimens     only), is one component of a     comprehensive MRSA colonization     surveillance program. It is not     intended to diagnose MRSA     infection nor to guide or     monitor treatment for     MRSA infections.     RESULT CALLED TO, READ BACK BY AND VERIFIED WITH:     C.SCHILLER,RN 06/04/14 0716 BY BSLADE   Studies/Results: No results found. Medications: I have reviewed the patient's current medications. Scheduled Meds: . sodium chloride   Intravenous Once  . atorvastatin  80 mg Oral QHS  . chlorhexidine  15 mL Mouth/Throat Daily  . Chlorhexidine Gluconate Cloth  6 each Topical Q0600  . ferrous sulfate  325 mg Oral Q breakfast  . folic acid  1 mg Oral Daily  . Influenza vac split quadrivalent PF  0.5 mL Intramuscular Tomorrow-1000  . levETIRAcetam  1,500 mg Oral BID  . mupirocin ointment  1 application Nasal BID  . pantoprazole (PROTONIX) IV  40 mg Intravenous Q12H  . pneumococcal 23 valent vaccine  0.5 mL Intramuscular Tomorrow-1000  . sodium chloride  3 mL Intravenous Q12H  . tiotropium  18 mcg Inhalation Daily   Continuous Infusions: . sodium chloride 75 mL/hr at 06/05/14 0700   PRN Meds:.albuterol, diclofenac sodium Assessment/Plan: [name] is a 63yo man w/ PMHx of HTN, COPD, hx of multiple CVAs (2012, 2014, most recent on 12/22/13 with residual left-sided weakness), seizures, and questionable history of metastatic prostate cancer (urology records not confirmed by clinic) who presented to the ED after two seizures and found to have a GI bleed.   1. Seizures 2/2 Medication Noncompliance: No events overnight. Awaiting MRI Brain results to see if patient has mets from primary prostate cancer that could be source for seizures. Medication noncompliance most likely cause.  - f/u MRI Brain  - Seizure precautions  - Continue Keppra 1,500 mg BID  - Consult neuro if patient seizes again  -  Cardiac monitoring   2. GI Bleeding: CBC 8.4 this morning. Per GI, will do EGD tomorrow. Patient continues to deny melena, hematochezia, and hematuria.  - GI on board, appreciate recommendations. Likely EGD tomorrow - Continue to monitor Hbg  - IVF @ 75 ml/hr  - Monitor VS  - Hold home Plavix   3. AKI- Resolved: Cr 2.09 on admission, improved  to 1.05 this morning. Unsure of baseline since has been fluctuating between 1.0-2.0. Will continue to monitor.   4. HTN: BP 98/64 on admission. BP has been stable in 048G-891Q systolic. Patient takes HCTZ 25 mg daily and Lisinopril 5 mg BID at home.  - Will restart home Lisinopril  5. COPD: Patient takes Albuterol PRN and Spiriva daily at home.  - Continue home meds   6. Hx of Multiple CVAs: Patient has history of CVAs in 2012, 2014, and most recently in May 2015. Patient has residual left-sided weakness in both upper and lower extremities. Patient takes atorvastatin 80 mg QHS and Plavix 75 mg daily.  - Hold home Plavix given GI bleed  - Continue home atorvastatin   Diet: Soft Diet DVT/PE PPx: SCDs  Dispo: Disposition is deferred at this time, awaiting improvement of current medical problems. Anticipated discharge in approximately 1-2 day(s).   The patient does have a current PCP Corky Sox, MD) and does need an [facility] hospital follow-up appointment after discharge.  The patient does not have transportation limitations that hinder transportation to clinic appointments.  .Services Needed at time of discharge: Y = Yes, Blank = No PT:   OT:   RN:   Equipment:   Other:     LOS: 2 days   Albin Felling, MD 06/05/2014, 11:00 AM

## 2014-06-06 ENCOUNTER — Encounter (HOSPITAL_COMMUNITY): Payer: Self-pay | Admitting: Gastroenterology

## 2014-06-06 ENCOUNTER — Encounter (HOSPITAL_COMMUNITY)
Admission: EM | Disposition: A | Discharge status: Home / Self Care | Payer: Medicare Other | Source: Home / Self Care | Attending: Internal Medicine

## 2014-06-06 DIAGNOSIS — E43 Unspecified severe protein-calorie malnutrition: Secondary | ICD-10-CM

## 2014-06-06 DIAGNOSIS — Z720 Tobacco use: Secondary | ICD-10-CM

## 2014-06-06 DIAGNOSIS — K2980 Duodenitis without bleeding: Secondary | ICD-10-CM

## 2014-06-06 DIAGNOSIS — D62 Acute posthemorrhagic anemia: Secondary | ICD-10-CM

## 2014-06-06 HISTORY — PX: ESOPHAGOGASTRODUODENOSCOPY: SHX5428

## 2014-06-06 LAB — GLUCOSE, CAPILLARY: Glucose-Capillary: 81 mg/dL (ref 70–99)

## 2014-06-06 SURGERY — EGD (ESOPHAGOGASTRODUODENOSCOPY)
Anesthesia: Moderate Sedation

## 2014-06-06 MED ORDER — FENTANYL CITRATE 0.05 MG/ML IJ SOLN
INTRAMUSCULAR | Status: AC
Start: 2014-06-06 — End: 2014-06-06
  Filled 2014-06-06: qty 2

## 2014-06-06 MED ORDER — MIDAZOLAM HCL 5 MG/ML IJ SOLN
INTRAMUSCULAR | Status: AC
  Filled 2014-06-06: qty 2

## 2014-06-06 MED ORDER — SODIUM CHLORIDE 0.9 % IV SOLN
INTRAVENOUS | Status: DC
  Administered 2014-06-06: 500 mL via INTRAVENOUS

## 2014-06-06 MED ORDER — MIDAZOLAM HCL 10 MG/2ML IJ SOLN
INTRAMUSCULAR | Status: DC | PRN
  Administered 2014-06-06: 2 mg via INTRAVENOUS
  Administered 2014-06-06: 1 mg via INTRAVENOUS

## 2014-06-06 MED ORDER — FENTANYL CITRATE 0.05 MG/ML IJ SOLN
INTRAMUSCULAR | Status: DC | PRN
  Administered 2014-06-06: 25 ug via INTRAVENOUS

## 2014-06-06 MED ORDER — BUTAMBEN-TETRACAINE-BENZOCAINE 2-2-14 % EX AERO
INHALATION_SPRAY | CUTANEOUS | Status: DC | PRN
  Administered 2014-06-06: 2 via TOPICAL

## 2014-06-06 NOTE — Progress Notes (Signed)
Pt seen and examined with Dr. Arcelia Jew. Please refer to resident note for details   Pt feels well today. No new complaints.    Exam:  Gen: AAO*3, NAD  CV: RRR, normal heart sounds  Pulm: CTA b/l  Abd: soft, non tender, BS +  Ext: no pedal edema  Neuro: no focal deficits   Assessment and Plan:  63 y/o male with recurrent seizures likely secondary to medication non compliance   Seizures:  - MRI brain noted- no evidence of metastatic disease but evidence of old infarcts and chronic changes  - c/w keppra, seizure precautions  - No further w/u for now  Anemia:  - Pt s/p EGD today- duodenal inflammation. F/u H. Pylori biopsies - No further w/u for now. - Anemia likely secondary to blood loss (uncertain if acute v/s chronic)  - Hg improved s/p 2 units PRBC. Will recheck CBC as outpatient - Pt may require parenteral iron therapy per GI -  Ok to resume home plavix for now  AKI:  - Cr normalized now. Will monitor  - Possibly prerenal etiology   HTN:  - BP stable. Resume home meds on d/c  Pt stable for d/c home today

## 2014-06-06 NOTE — Plan of Care (Signed)
Problem: Phase II Progression Outcomes Goal: Progress activity as tolerated unless otherwise ordered Outcome: Not Progressing Pt. Is currently placed on Bedrest  Problem: Phase III Progression Outcomes Goal: Tolerating diet Outcome: Not Progressing Pt. Currently NPO Goal: IV meds to PO Outcome: Not Progressing Pt. Is currently NPO

## 2014-06-06 NOTE — Progress Notes (Signed)
[name] Mertz to be D/C'd Home per MD order.  Discussed with the patient and all questions fully answered.    Medication List         albuterol 108 (90 BASE) MCG/ACT inhaler  Commonly known as:  PROVENTIL HFA;VENTOLIN HFA  Inhale 2 puffs into the lungs every 6 (six) hours as needed for wheezing.     atorvastatin 80 MG tablet  Commonly known as:  LIPITOR  Take 80 mg by mouth at bedtime.     chlorhexidine 0.12 % solution  Commonly known as:  PERIDEX  Use as directed 15 mLs in the mouth or throat daily.     clopidogrel 75 MG tablet  Commonly known as:  PLAVIX  Take 1 tablet (75 mg total) by mouth daily with breakfast.     diclofenac sodium 1 % Gel  Commonly known as:  VOLTAREN  Apply 1 application topically 3 (three) times daily as needed (knee pain).     ferrous sulfate 325 (65 FE) MG tablet  Take 325 mg by mouth daily with breakfast.     folic acid 1 MG tablet  Commonly known as:  FOLVITE  Take 1 tablet (1 mg total) by mouth daily.     hydrochlorothiazide 25 MG tablet  Commonly known as:  HYDRODIURIL  Take 25 mg by mouth daily.     levETIRAcetam 500 MG tablet  Commonly known as:  KEPPRA  Take 1,500 mg by mouth 2 (two) times daily.     lisinopril 10 MG tablet  Commonly known as:  PRINIVIL,ZESTRIL  Take 0.5 tablets (5 mg total) by mouth 2 (two) times daily.     multivitamin with minerals Tabs tablet  Take 1 tablet by mouth daily.     pantoprazole 40 MG tablet  Commonly known as:  PROTONIX  Take 1 tablet (40 mg total) by mouth daily.     tiotropium 18 MCG inhalation capsule  Commonly known as:  SPIRIVA HANDIHALER  Place 1 capsule (18 mcg total) into inhaler and inhale daily.        VVS, Skin clean, dry and intact without evidence of skin break down, no evidence of skin tears noted. IV catheter discontinued intact. Site without signs and symptoms of complications. Dressing and pressure applied.  An After Visit Summary was printed and given to the  patient.  D/c education completed with patient/family including follow up instructions, medication list, d/c activities limitations if indicated, with other d/c instructions as indicated by MD - patient able to verbalize understanding, all questions fully answered.   Patient instructed to return to ED, call 911, or call MD for any changes in condition.   Patient escorted via [place], and D/C home via private auto.  Audria Nine F 06/06/2014 7:22 PM

## 2014-06-06 NOTE — Op Note (Signed)
[facility] [place], [address]   ENDOSCOPY PROCEDURE REPORT  PATIENT: [name], [name]  MR#: [id number] BIRTHDATE: 03-31-1951 , 63  yrs. old GENDER: male ENDOSCOPIST: Gatha Mayer, MD, [facility] PROCEDURE DATE:  06/06/2014 PROCEDURE:  EGD w/ biopsy for H.pylori ASA CLASS:     Class III INDICATIONS:  iron deficiency anemia. MEDICATIONS: Fentanyl 25 mcg IV and Versed 3 mg IV TOPICAL ANESTHETIC: Cetacaine Spray  DESCRIPTION OF PROCEDURE: After the risks benefits and alternatives of the procedure were thoroughly explained, informed consent was obtained.  The Pentax Gastroscope [id number] endoscope was introduced through the mouth and advanced to the second portion of the duodenum , Without limitations.  The instrument was slowly withdrawn as the mucosa was fully examined.    DUODENUM: Mild duodenal inflammation was found in the duodenal bulb. Otherwise normal EGD Cold forcep biopsies were taken at the antrum and angularis to evaluate for h.  pylori.  Retroflexed views revealed no abnormalities.     The scope was then withdrawn from the patient and the procedure completed.  COMPLICATIONS: There were no immediate complications.  ENDOSCOPIC IMPRESSION: 1.   Duodenal inflammation was found in the duodenal bulb NIX_THIS 2.   Otherwise normal EGD 3.   H.  pylori biopsies  RECOMMENDATIONS: 1.  Await biopsy results 2.  Treat H.  pylori if + - will f/u results further plans beyond that per Dr.  Hilarie Fredrickson I recommend parenteral iron Tx No need to stay in house for GI issues   eSigned:  Gatha Mayer, MD, [facility] 06/06/2014 2:48 PM  cc: Zenovia Jarred, MD

## 2014-06-06 NOTE — Progress Notes (Signed)
Subjective: Patient has no complaints this morning. He will get an EGD today.   Objective: Vital signs in last 24 hours: Filed Vitals:   06/05/14 2138 06/06/14 0609 06/06/14 1004 06/06/14 1027  BP: 146/81 164/94  150/70  Pulse: 66 101    Temp: 98.3 F (36.8 C) 98.5 F (36.9 C)    TempSrc: Oral Oral    Resp: 18 18    Height:      Weight:      SpO2: 98% 100% 98%    Weight change:   Intake/Output Summary (Last 24 hours) at 06/06/14 1300 Last data filed at 06/06/14 1022  Gross per 24 hour  Intake    543 ml  Output    600 ml  Net    -57 ml   Physical Exam General: alert, sitting up in bed, NAD HEENT: [place]/AT, EOMI, mucus membranes moist CV: RRR, normal S1/S2, no m/g/r Pulm: CTA bilaterally, breaths non-labored Abd: BS+, soft, non-tender Ext: warm, no edema Neuro: alert and oriented x 3, CNs II-XII intact  Lab Results: Basic Metabolic Panel:  Recent Labs Lab 06/04/14 0106 06/05/14 0323  NA 139 140  K 3.6* 3.8  CL 104 107  CO2 20 22  GLUCOSE 84 83  BUN 25* 15  CREATININE 1.75* 1.05  CALCIUM 9.4 9.2   Liver Function Tests:  Recent Labs Lab 06/04/14 0106  AST 24  ALT 18  ALKPHOS 74  BILITOT 0.6  PROT 8.0  ALBUMIN 3.2*   No results found for this basename: LIPASE, AMYLASE,  in the last 168 hours No results found for this basename: AMMONIA,  in the last 168 hours CBC:  Recent Labs Lab 06/03/14 1308  06/04/14 0106 06/05/14 0323  WBC 6.7  --  9.3 6.4  NEUTROABS 4.4  --   --   --   HGB 6.7*  < > 9.3* 8.4*  HCT 21.8*  < > 29.7* 26.3*  MCV 74.1*  --  79.2 77.1*  PLT 369  --  296 285  < > = values in this interval not displayed. Cardiac Enzymes: No results found for this basename: CKTOTAL, CKMB, CKMBINDEX, TROPONINI,  in the last 168 hours BNP: No results found for this basename: PROBNP,  in the last 168 hours D-Dimer: No results found for this basename: DDIMER,  in the last 168 hours CBG: No results found for this basename: GLUCAP,  in the last  168 hours Hemoglobin A1C: No results found for this basename: HGBA1C,  in the last 168 hours Fasting Lipid Panel: No results found for this basename: CHOL, HDL, LDLCALC, TRIG, CHOLHDL, LDLDIRECT,  in the last 168 hours Thyroid Function Tests: No results found for this basename: TSH, T4TOTAL, FREET4, T3FREE, THYROIDAB,  in the last 168 hours Coagulation: No results found for this basename: LABPROT, INR,  in the last 168 hours Anemia Panel: No results found for this basename: VITAMINB12, FOLATE, FERRITIN, TIBC, IRON, RETICCTPCT,  in the last 168 hours Urine Drug Screen: Drugs of Abuse     Component Value Date/Time   LABOPIA NONE DETECTED 01/22/2014 [place] 10/14/2010 2140   [place] 01/22/2014 1411   COCAINSCRNUR  Value: POSITIVE (NOTE) Result repeated and verified. Sent for confirmatory testing* 10/14/2010 2140   LABBENZ NONE DETECTED 01/22/2014 1411   LABBENZ NEGATIVE 10/14/2010 2140   AMPHETMU NONE DETECTED 01/22/2014 1411   AMPHETMU NEGATIVE 10/14/2010 2140   THCU NONE DETECTED 01/22/2014 1411   LABBARB NONE DETECTED 01/22/2014 1411  Alcohol Level: No results found for this basename: ETH,  in the last 168 hours Urinalysis:  Recent Labs Lab 06/03/14 1701  COLORURINE AMBER*  LABSPEC 1.022  PHURINE 5.0  GLUCOSEU NEGATIVE  HGBUR MODERATE*  BILIRUBINUR SMALL*  KETONESUR 15*  PROTEINUR 30*  UROBILINOGEN 0.2  NITRITE NEGATIVE  LEUKOCYTESUR NEGATIVE     Micro Results: Recent Results (from the past 240 hour(s))  MRSA PCR SCREENING     Status: Abnormal   Collection Time    06/04/14  5:37 AM      Result Value Ref Range Status   MRSA by PCR POSITIVE (*) NEGATIVE Final   Comment:            The GeneXpert MRSA Assay (FDA     approved for NASAL specimens     only), is one component of a     comprehensive MRSA colonization     surveillance program. It is not     intended to diagnose MRSA     infection nor to guide or     monitor treatment for      MRSA infections.     RESULT CALLED TO, READ BACK BY AND VERIFIED WITH:     C.SCHILLER,RN 06/04/14 0716 BY BSLADE   Studies/Results: Mr Jeri Cos [facility] Contrast  06/05/2014   CLINICAL DATA:  Patient presented to the emergency department on 06/03/2014 with seizures. History of noncompliance with anticonvulsant medication. History of prior stroke. History of prostate cancer.  EXAM: MRI HEAD WITHOUT AND WITH CONTRAST  TECHNIQUE: Multiplanar, multiecho pulse sequences of the brain and surrounding structures were obtained without and with intravenous contrast.  CONTRAST:  98mL MULTIHANCE GADOBENATE DIMEGLUMINE 529 MG/ML IV SOLN  COMPARISON:  MR brain 11/23/2013.  FINDINGS: No evidence for acute infarction, acute hemorrhage, mass lesion, hydrocephalus, or extra-axial fluid. Pseudoasymmetry of slightly brighter LEFT cerebellar hemisphere on DWI relates to a large area of chronic infarction in the RIGHT cerebellum.  Chronic encephalomalacia of not only the RIGHT cerebellum, but the RIGHT dorsal lateral medulla, and RIGHT medial posterior frontal and parietal lobes related to previous cerebral infarction. Generalized atrophy. Chronic microvascular ischemic change of a moderate to severe nature. Numerous microbleeds related to chronic infarction and hypertensive cerebrovascular disease. Slight postcontrast gyriform enhancement in the medial RIGHT parietal cortex relates to the late subacute time course of the previous infarct demonstrated in April.  Flow voids are maintained. LEFT vertebral is the dominant contributor to the basilar with the RIGHT vertebral primarily supplying PICA. Negative orbits. No acute mastoid fluid. Significant opacification of the LEFT maxillary sinus with medial expansion, possible mucocele or antrochoanal polyp, similar to April 2015  IMPRESSION: Chronic changes as described. No evidence for acute stroke or restricted diffusion related to recent seizure.  Late subacute infarct resolution results in  medial posterior frontal and parietal gyriform enhancement.  Chronic changes elsewhere as described.  Chronic opacification LEFT maxillary sinus, possible antrochoanal polyp or mucocele.   Electronically Signed   By: Rolla Flatten M.D.   On: 06/05/2014 11:21   Medications: I have reviewed the patient's current medications. Scheduled Meds: . sodium chloride   Intravenous Once  . atorvastatin  80 mg Oral QHS  . chlorhexidine  15 mL Mouth/Throat Daily  . Chlorhexidine Gluconate Cloth  6 each Topical Q0600  . ferrous sulfate  325 mg Oral Q breakfast  . folic acid  1 mg Oral Daily  . Influenza vac split quadrivalent PF  0.5 mL Intramuscular Tomorrow-1000  . levETIRAcetam  1,500  mg Oral BID  . lisinopril  5 mg Oral BID  . mupirocin ointment  1 application Nasal BID  . pantoprazole (PROTONIX) IV  40 mg Intravenous Q12H  . pneumococcal 23 valent vaccine  0.5 mL Intramuscular Tomorrow-1000  . sodium chloride  3 mL Intravenous Q12H  . tiotropium  18 mcg Inhalation Daily   Continuous Infusions: . sodium chloride 75 mL/hr at 06/06/14 0301   PRN Meds:.albuterol, diclofenac sodium Assessment/Plan: Mr. 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   1. Seizures 2/2 Medication Noncompliance: No events overnight. MRI negative for mets from primary prostate cancer. Medication noncompliance most likely cause.  - Seizure precautions  - Continue Keppra 1,500 mg BID  - Cardiac monitoring   2. GI Bleeding: Hbg 8.4 yesterday. FOBT positive. Patient had EGD today. Patient continues to deny melena, hematochezia, and hematuria.  - GI on board, appreciate recommendations. EGD today, will f/u. Can be discharged if EGD normal.  - Continue to monitor  Hbg  - IVF @ 75 ml/hr  - Monitor VS  - Hold home Plavix   3. AKI- Resolved: Cr 2.09 on admission, improved to 1.05 yesterday. Unsure of baseline since has been fluctuating between 1.0-2.0. Will continue to monitor.   4. HTN: BP 98/64 on admission. BP has been stable in 923R-007M systolic. Patient takes HCTZ 25 mg daily and Lisinopril 5 mg BID at home.  - Continue home Lisinopril  - Will be discharged on home meds  5. COPD: Patient takes Albuterol PRN and Spiriva daily at home.  - Continue home meds   6. Hx of Multiple CVAs: Patient has history of CVAs in 2012, 2014, and most recently in May 2015. Patient has residual left-sided weakness in both upper and lower extremities. Patient takes atorvastatin 80 mg QHS and Plavix 75 mg daily.  - Hold home Plavix given GI bleed  - Continue home atorvastatin   Diet: Soft Diet  DVT/PE PPx: SCDs  Dispo: Can be discharged today if EGD normal and ok with GI  The patient does have a current PCP Corky Sox, MD) and does need an [facility] hospital follow-up appointment after discharge.  The patient does not have transportation limitations that hinder transportation to clinic appointments.  .Services Needed at time of discharge: Y = Yes, Blank = No PT:   OT:   RN:   Equipment:   Other:     LOS: 3 days   Albin Felling, MD 06/06/2014, 1:00 PM

## 2014-06-06 NOTE — Discharge Instructions (Signed)
It was a pleasure taking care of you, [name].  Please make sure you go to your appointment with Dr. Ronnald Ramp on Nov 19th.   Please take Keppra 1500 mg twice a day. That will be 3 pills twice a day. This will help you from having more seizures.  Take care, Dr. Arcelia Jew

## 2014-06-06 NOTE — Discharge Summary (Signed)
Name: [name] MRN: [id number] DOB: 1951-04-12 63 y.o. PCP: Corky Sox, MD  Date of Admission: 06/03/2014 12:32 PM Date of Discharge: 06/06/14 Attending Physician: Aldine Contes, MD  Discharge Diagnosis: 1.  Active Problems:   Hypertension   COPD   History of stroke   Tobacco abuse   Protein-calorie malnutrition, severe   Anemia, chronic disease   Occult blood in stools   Seizure   GI bleed   GI bleeding   Acute blood loss anemia   Duodenitis  Discharge Medications:   Medication List         albuterol 108 (90 BASE) MCG/ACT inhaler  Commonly known as:  PROVENTIL HFA;VENTOLIN HFA  Inhale 2 puffs into the lungs every 6 (six) hours as needed for wheezing.     atorvastatin 80 MG tablet  Commonly known as:  LIPITOR  Take 80 mg by mouth at bedtime.     chlorhexidine 0.12 % solution  Commonly known as:  PERIDEX  Use as directed 15 mLs in the mouth or throat daily.     clopidogrel 75 MG tablet  Commonly known as:  PLAVIX  Take 1 tablet (75 mg total) by mouth daily with breakfast.     diclofenac sodium 1 % Gel  Commonly known as:  VOLTAREN  Apply 1 application topically 3 (three) times daily as needed (knee pain).     ferrous sulfate 325 (65 FE) MG tablet  Take 325 mg by mouth daily with breakfast.     folic acid 1 MG tablet  Commonly known as:  FOLVITE  Take 1 tablet (1 mg total) by mouth daily.     hydrochlorothiazide 25 MG tablet  Commonly known as:  HYDRODIURIL  Take 25 mg by mouth daily.     levETIRAcetam 500 MG tablet  Commonly known as:  KEPPRA  Take 1,500 mg by mouth 2 (two) times daily.     lisinopril 10 MG tablet  Commonly known as:  PRINIVIL,ZESTRIL  Take 0.5 tablets (5 mg total) by mouth 2 (two) times daily.     multivitamin with minerals Tabs tablet  Take 1 tablet by mouth daily.     pantoprazole 40 MG tablet  Commonly known as:  PROTONIX  Take 1 tablet (40 mg total) by mouth daily.     tiotropium 18 MCG inhalation capsule    Commonly known as:  SPIRIVA HANDIHALER  Place 1 capsule (18 mcg total) into inhaler and inhale daily.        Disposition and follow-up:   [name] was discharged from [facility] in Good condition.  At the hospital follow up visit please address:  1.  Anemia: Presented with Hbg 6.7, given 2 units PRBCs. Hbg 8.4 upon discharge. Needs repeat CBC at follow up. Also follow up EGD biopsies for H.pylori.  Seizures: Check that patient is taking correct dose of Keppra. Supposed to be on Keppra 1500 mg BID.   2.  Labs / imaging needed at time of follow-up: CBC  3.  Pending labs/ test needing follow-up: EGD biopsies  Follow-up Appointments: Follow-up Information   Follow up with Luanne Bras, MD. (Appointment on Nov 19th at 1:45PM)    Specialty:  Internal Medicine   Contact information:   [place] [address] [phone]       Discharge Instructions: Discharge Instructions   Diet - low sodium heart healthy    Complete by:  As directed      Increase  activity slowly    Complete by:  As directed            Consultations:    Procedures Performed:  [name] Contrast  06/05/2014   CLINICAL DATA:  Patient presented to the emergency department on 06/03/2014 with seizures. History of noncompliance with anticonvulsant medication. History of prior stroke. History of prostate cancer.  EXAM: MRI HEAD WITHOUT AND WITH CONTRAST  TECHNIQUE: Multiplanar, multiecho pulse sequences of the brain and surrounding structures were obtained without and with intravenous contrast.  CONTRAST:  47mL MULTIHANCE GADOBENATE DIMEGLUMINE 529 MG/ML IV SOLN  COMPARISON:  Terry brain 11/23/2013.  FINDINGS: No evidence for acute infarction, acute hemorrhage, mass lesion, hydrocephalus, or extra-axial fluid. Pseudoasymmetry of slightly brighter LEFT cerebellar hemisphere on DWI relates to a large area of chronic infarction in the RIGHT cerebellum.  Chronic encephalomalacia of not  only the RIGHT cerebellum, but the RIGHT dorsal lateral medulla, and RIGHT medial posterior frontal and parietal lobes related to previous cerebral infarction. Generalized atrophy. Chronic microvascular ischemic change of a moderate to severe nature. Numerous microbleeds related to chronic infarction and hypertensive cerebrovascular disease. Slight postcontrast gyriform enhancement in the medial RIGHT parietal cortex relates to the late subacute time course of the previous infarct demonstrated in April.  Flow voids are maintained. LEFT vertebral is the dominant contributor to the basilar with the RIGHT vertebral primarily supplying PICA. Negative orbits. No acute mastoid fluid. Significant opacification of the LEFT maxillary sinus with medial expansion, possible mucocele or antrochoanal polyp, similar to April 2015  IMPRESSION: Chronic changes as described. No evidence for acute stroke or restricted diffusion related to recent seizure.  Late subacute infarct resolution results in medial posterior frontal and parietal gyriform enhancement.  Chronic changes elsewhere as described.  Chronic opacification LEFT maxillary sinus, possible antrochoanal polyp or mucocele.   Electronically Signed   By: Rolla Flatten M.D.   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 Patient states both seizures occurred this morning. Each lasted approximately 2-3 minutes. He states he lost control of his bowels during the first seizure. Patient reports he is taking 4 pills of Keppra every day, 2 in the morning and 2 in the evening. He does not know how many mg are in each pill. Per med rec, patient is supposed to be taking 1,500 mg BID, each pill is  500 mg. Patient reports his last seizure was earlier this year. Per chart review, patient was hospitalized in May 2015 for seizures due to medication noncompliance. Patient was scheduled to follow up with Neurology but never did. Patient reports he has not drank alcohol since his last admission this May. He denies chest pain, nausea, vomiting, dizziness, palpitations, and abdominal pain.    In the ED, patient was found to have a Hbg 6.5. He was typed and screened and given 2 units PRBCs. Patient denies dark stools, blood in the stool, hematuria, and nosebleeds. 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 He has been FOBT positive in the past and was FOBT positive this admission as well. 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   1. Seizures 2/2 Medication Noncompliance: Patient presented to the ED after two seizure-like episodes that occurred earlier on the morning of 06/03/14, each of which lasted 2-3 minutes and where he lost control of his bladder and bowels. He was hospitalized in May 2015 for seizures due to medication noncompliance. He was scheduled to follow up with Neurology but never did, but denies drinking alcohol since his last admission this May. While in the hospital we continued [name] 1,500 mg BID Keppra. He had some confusion regarding how many Keppra pills he should be taking each day. An MRI ruled out metastasis from primary prostate cancer, so the most likely cause of his seizure was medical noncompliance. We spent some time educating [name] on his  medications as well as when and how much of them to take. He was discharged on his home Keppra 1500 mg BID. At follow visit, please address what dose of Keppra the patient is taking.   2. GI Bleeding: Upon admission, his hemoglobin was 6.5 and he was transfused with 2 units of PRBCs. A subsequent FOBT was found to be positive, but an EGD only showed inflammation of the duodenum. He was found to have low iron levels, thus his anemia may have been caused my noncompliance with his iron supplement. His last hemoglobin before discharge was 8.4. Will need to be repeated at follow up visit.  3. AKI- Resolved: Upon admission creatinine was 2.09, then improved to 1.05 by day two of his hospital stay. His baseline is hard to determine since he has been fluctuating between 1.0-2.0, so we monitored it throughout his hospital stay. His last creatinine was 1.05.   4. HTN: Patient's blood pressure was 98/64 on admission before his blood transfusion. He takes HCTZ 25 mg daily and Lisinopril 5 mg BID at home for his hypertension. During his hospital stay we held his hypertension meds until day 2 of his hospitalization, because his blood pressure was stable in 470L-295F systolic. On day 2 his systolic blood pressure reached the 150's, so we put him back on his home Lisinopril. His blood pressures remained stable for the remainder of his admission, but we recommended that he follow up as an outpatient. He was discharged on his home HCTZ and Lisinopril.   5. COPD: Patient takes Albuterol PRN and Spiriva daily at home. He was continued on his home medications.   6. Hx of Multiple CVAs: Patient has history of CVAs in 2012, 2014, and most recently in May 2015. Patient has residual left-sided weakness in both upper and lower extremities. Patient takes atorvastatin 80 mg QHS and Plavix 75 mg daily. His home Plavix was held given his GI bleed. He was resumed on his home Plavix upon discharge. He was continued on his home  Atorvastatin during his hospitalization.    Discharge Vitals:   BP 154/78  Pulse 60  Temp(Src) 98.1 F (36.7 C) (Oral)  Resp 18  Ht 5\' 8"  (1.727 m)  Wt 140 lb 3.4 oz (63.6 kg)  BMI 21.32 kg/m2  SpO2 98% Physical Exam  General: alert, sitting up in bed, NAD  HEENT: [place]/AT, EOMI, mucus membranes moist  CV: RRR, normal S1/S2, no m/g/r  Pulm: CTA bilaterally, breaths non-labored  Abd: BS+, soft, non-tender  Ext: warm, no edema  Neuro: alert and oriented x 3, CNs II-XII intact  Discharge Labs:  No results found for this or any previous visit (from the past 24 hour(s)).  Signed: Albin Felling, MD 06/07/2014, 2:27 PM    Services Ordered on Discharge: None Equipment Ordered on Discharge: None

## 2014-06-06 NOTE — Progress Notes (Signed)
Subjective: No acute events overnight. Patient was AAOx3 and doing well this am.  Objective: Vital signs in last 24 hours: Filed Vitals:   06/06/14 1004 06/06/14 1027 06/06/14 1310 06/06/14 1346  BP:  150/70 168/74 126/55  Pulse:   96 60  Temp:   98.1 F (36.7 C) 99 F (37.2 C)  TempSrc:   Oral Oral  Resp:   18 17  Height:      Weight:      SpO2: 98%  100% 100%   Weight change:   Intake/Output Summary (Last 24 hours) at 06/06/14 1403 Last data filed at 06/06/14 1022  Gross per 24 hour  Intake    468 ml  Output    600 ml  Net   -132 ml   BP 126/55  Pulse 60  Temp(Src) 99 F (37.2 C) (Oral)  Resp 17  Ht 5\' 8"  (1.727 m)  Wt 63.6 kg (140 lb 3.4 oz)  BMI 21.32 kg/m2  SpO2 100%    Physical Exam: Blood pressure 102/60, pulse 80, temperature 98.4 F (36.9 C), temperature source Oral, resp. rate 16, SpO2 100.00%.  General: tired, cachectic appearance HEENT: [place]/AT, EOMI, conjunctiva pale, sclera anicteric, mucus membranes moist  CV: distant heart sounds, no m/g/r  Pulm: CTA bilaterally, breaths non-labored  Abd: BS+, soft, nontender  Ext: warm, no edema, distal pulses 1+  Neuro: alert and oriented x 3, flat affect, able to move extremities  Lab Results: Studies/Results: Mr Jeri Cos Wo Contrast  06/05/2014   CLINICAL DATA:  Patient presented to the emergency department on 06/03/2014 with seizures. History of noncompliance with anticonvulsant medication. History of prior stroke. History of prostate cancer.  EXAM: MRI HEAD WITHOUT AND WITH CONTRAST  TECHNIQUE: Multiplanar, multiecho pulse sequences of the brain and surrounding structures were obtained without and with intravenous contrast.  CONTRAST:  51mL MULTIHANCE GADOBENATE DIMEGLUMINE 529 MG/ML IV SOLN  COMPARISON:  MR brain 11/23/2013.  FINDINGS: No evidence for acute infarction, acute hemorrhage, mass lesion, hydrocephalus, or extra-axial fluid. Pseudoasymmetry of slightly brighter LEFT cerebellar hemisphere on DWI  relates to a large area of chronic infarction in the RIGHT cerebellum.  Chronic encephalomalacia of not only the RIGHT cerebellum, but the RIGHT dorsal lateral medulla, and RIGHT medial posterior frontal and parietal lobes related to previous cerebral infarction. Generalized atrophy. Chronic microvascular ischemic change of a moderate to severe nature. Numerous microbleeds related to chronic infarction and hypertensive cerebrovascular disease. Slight postcontrast gyriform enhancement in the medial RIGHT parietal cortex relates to the late subacute time course of the previous infarct demonstrated in April.  Flow voids are maintained. LEFT vertebral is the dominant contributor to the basilar with the RIGHT vertebral primarily supplying PICA. Negative orbits. No acute mastoid fluid. Significant opacification of the LEFT maxillary sinus with medial expansion, possible mucocele or antrochoanal polyp, similar to April 2015  IMPRESSION: Chronic changes as described. No evidence for acute stroke or restricted diffusion related to recent seizure.  Late subacute infarct resolution results in medial posterior frontal and parietal gyriform enhancement.  Chronic changes elsewhere as described.  Chronic opacification LEFT maxillary sinus, possible antrochoanal polyp or mucocele.   Electronically Signed   By: Rolla Flatten M.D.   On: 06/05/2014 11:21   Medications: I have reviewed the patient's current medications. Scheduled Meds: . sodium chloride   Intravenous Once  . atorvastatin  80 mg Oral QHS  . chlorhexidine  15 mL Mouth/Throat Daily  . Chlorhexidine Gluconate Cloth  6 each Topical Q0600  .  ferrous sulfate  325 mg Oral Q breakfast  . folic acid  1 mg Oral Daily  . Influenza vac split quadrivalent PF  0.5 mL Intramuscular Tomorrow-1000  . levETIRAcetam  1,500 mg Oral BID  . lisinopril  5 mg Oral BID  . mupirocin ointment  1 application Nasal BID  . pantoprazole (PROTONIX) IV  40 mg Intravenous Q12H  .  pneumococcal 23 valent vaccine  0.5 mL Intramuscular Tomorrow-1000  . sodium chloride  3 mL Intravenous Q12H  . tiotropium  18 mcg Inhalation Daily   Continuous Infusions: . sodium chloride 75 mL/hr at 06/06/14 0301  . sodium chloride 500 mL (06/06/14 1349)   PRN Meds:.albuterol, diclofenac sodium  Assessment/Plan: 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   1. Seizures 2/2 Medication Noncompliance: Patient presented to the ED after having two seizures this morning. Patient reports he is taking Keppra 2 pills in the morning and 2 pills in the evening which equivocates to a total of 1000 mg BID. Patient is supposed to be taking Keppra 1,500 mg BID. He was hospitalized in May 2015 for seizures due to medication noncompliance as well. Will educate patient on importance of taking correct dosage of Keppra. We will also get an MRI to rule out brain mets from his hx of prostate cancer.  - Seizure precautions  - Keppra 1,500 mg BID  - Consult neuro if patient seizes again  - Cardiac monitoring  - MRI: No evidence for acute infarction, hemorrhage or mass lesion. Chronic changes present: large area of infarction in the right cerebellum, numerous micro bleeds related to chronic hypertension,  Slight postcontrast gyriform enhancement in the medial RIGHT parietal cortex relates to the late subacute time course of the previous infarct demonstrated in April. --> seizure is most likely due to medication noncompliance.   2. GI Bleeding: Patient found to have Hbg 6.5 on admission. He was given 2 units of PRBCs. Patient denies any source of bleeding. He was FOBT positive. He denies melena, hematochezia, hematuria, and nosebleeds. He has a history of mixed iron deficiency and chronic  disease anemia. He takes folic acid and iron at home. Will consult GI in AM. Patient is scheduled to have EGD with Dr. Hilarie Fredrickson in early November.  - Post transfusion CBC was 9.3 - IVF @ 75 ml/hr  - Monitor VS  - Hold home Plavix -EGD read pending   3. HTN: BP 98/64 on admission. BP has been stable in 676P-950D systolic. Patient takes HCTZ 25 mg daily and Lisinopril 5 mg BID at home.  - Started back on Lisinopril for hypertension into the 326Z systolic O/N   4. COPD: Patient takes Albuterol PRN and Spiriva daily at home.  - Continue home meds   5. Hx of Multiple CVAs: Patient has history of CVAs in 2012, 2014, and most recently in May 2015. Patient has residual left-sided weakness in both upper and lower extremities. Patient takes atorvastatin 80 mg QHS and Plavix 75 mg daily.  - Hold home Plavix given GI bleed  - Continue home atorvastatin   Diet: NPO   DVT/PE PPx: SCDs   Dispo: Disposition is deferred at this time, awaiting improvement of current medical problems. Anticipated discharge in approximately 1-2 day(s).  This is a Careers information officer Note.  The care of the patient was discussed with Dr. Lucilla Lame and the assessment and plan formulated with their assistance.  Please see their attached note for official documentation of the daily encounter.   LOS: 3 days   Sherie Don Day, Med Student 06/06/2014, 2:03 PM   I have seen and examined the patient, and reviewed the daily progress note by Andee Poles Day, MS 3 and discussed the care of the patient with them. Please see my progress note from 06/06/14 for further details regarding assessment and plan.    Signed:  Albin Felling, MD 06/07/2014, 2:12 PM

## 2014-06-07 ENCOUNTER — Encounter (HOSPITAL_COMMUNITY): Payer: Self-pay | Admitting: Internal Medicine

## 2014-06-07 ENCOUNTER — Encounter: Payer: Self-pay | Admitting: Internal Medicine

## 2014-06-07 LAB — CLOTEST (H. PYLORI), BIOPSY: HELICOBACTER SCREEN: NEGATIVE

## 2014-06-07 NOTE — Progress Notes (Signed)
Quick Note:  CLO test negative Will send letter  Do not anticipate other w/u unless cannot hold Hgb after iron repleted ______

## 2014-06-07 NOTE — Discharge Summary (Signed)
INTERNAL MEDICINE ATTENDING DISCHARGE COSIGN   I evaluated the patient on the day of discharge and discussed the discharge plan with my resident team. I agree with the discharge documentation and disposition.   Journee Kohen 06/07/2014, 3:01 PM

## 2014-06-28 ENCOUNTER — Encounter: Payer: Self-pay | Admitting: Internal Medicine

## 2014-06-28 ENCOUNTER — Ambulatory Visit (INDEPENDENT_AMBULATORY_CARE_PROVIDER_SITE_OTHER): Payer: Medicare Other | Admitting: Internal Medicine

## 2014-06-28 VITALS — BP 126/69 | HR 73 | Temp 97.7°F | Ht 68.5 in | Wt 138.8 lb

## 2014-06-28 DIAGNOSIS — I69859 Hemiplegia and hemiparesis following other cerebrovascular disease affecting unspecified side: Secondary | ICD-10-CM

## 2014-06-28 DIAGNOSIS — K2980 Duodenitis without bleeding: Secondary | ICD-10-CM

## 2014-06-28 DIAGNOSIS — D509 Iron deficiency anemia, unspecified: Secondary | ICD-10-CM

## 2014-06-28 DIAGNOSIS — Z0000 Encounter for general adult medical examination without abnormal findings: Secondary | ICD-10-CM

## 2014-06-28 DIAGNOSIS — R569 Unspecified convulsions: Secondary | ICD-10-CM

## 2014-06-28 DIAGNOSIS — I10 Essential (primary) hypertension: Secondary | ICD-10-CM

## 2014-06-28 DIAGNOSIS — I639 Cerebral infarction, unspecified: Secondary | ICD-10-CM

## 2014-06-28 LAB — CBC
HCT: 31.3 % — ABNORMAL LOW (ref 39.0–52.0)
Hemoglobin: 9.8 g/dL — ABNORMAL LOW (ref 13.0–17.0)
MCH: 23.8 pg — AB (ref 26.0–34.0)
MCHC: 31.3 g/dL (ref 30.0–36.0)
MCV: 76 fL — ABNORMAL LOW (ref 78.0–100.0)
MPV: 9.9 fL (ref 9.4–12.4)
PLATELETS: 329 10*3/uL (ref 150–400)
RBC: 4.12 MIL/uL — AB (ref 4.22–5.81)
RDW: 20.2 % — ABNORMAL HIGH (ref 11.5–15.5)
WBC: 7.2 10*3/uL (ref 4.0–10.5)

## 2014-06-28 MED ORDER — SENNOSIDES-DOCUSATE SODIUM 8.6-50 MG PO TABS: 1.0000 | ORAL_TABLET | Freq: Every evening | ORAL | Status: DC | PRN

## 2014-06-28 MED ORDER — FERROUS SULFATE 325 (65 FE) MG PO TABS: 325.0000 mg | ORAL_TABLET | Freq: Two times a day (BID) | ORAL | Status: DC

## 2014-06-28 MED ORDER — PANTOPRAZOLE SODIUM 40 MG PO TBEC: 40.0000 mg | DELAYED_RELEASE_TABLET | Freq: Every day | ORAL | Status: DC

## 2014-06-28 MED ORDER — CLOPIDOGREL BISULFATE 75 MG PO TABS: 75.0000 mg | ORAL_TABLET | Freq: Every day | ORAL | Status: DC

## 2014-06-28 MED ORDER — FOLIC ACID 1 MG PO TABS: 1.0000 mg | ORAL_TABLET | Freq: Every day | ORAL | Status: DC

## 2014-06-28 MED ORDER — LISINOPRIL 10 MG PO TABS: 5.0000 mg | ORAL_TABLET | Freq: Two times a day (BID) | ORAL | Status: DC

## 2014-06-28 MED ORDER — ZOSTER VACCINE LIVE 19400 UNT/0.65ML ~~LOC~~ SOLR: 0.6500 mL | Freq: Once | SUBCUTANEOUS | Status: DC

## 2014-06-28 NOTE — Assessment & Plan Note (Signed)
No seizures since recent hospital admission.  -Continue Keppra 1500 mg po bid

## 2014-06-28 NOTE — Progress Notes (Signed)
Medicine attending: Medical history, presenting problems, physical findings, and medications, reviewed with resident physician Dr. Luanne Bras and I concur with his evaluation and management plan.

## 2014-06-28 NOTE — Assessment & Plan Note (Signed)
Patient w/ previous h/o iron deficiency anemia, recently worsened by acute blood loss in the setting of GIB. Denies any recent dizziness, lightheadedness, palpitations, hematochezia or melena.  -Check CBC -Increase Ferrous Sulfate to 325 mg bid + Seenokot-S prn for constipation

## 2014-06-28 NOTE — Assessment & Plan Note (Signed)
Given Rx for Zostavax to take to pharmacy

## 2014-06-28 NOTE — Assessment & Plan Note (Signed)
BP Readings from Last 3 Encounters:  06/28/14 126/69  06/06/14 154/78  05/14/14 120/62    Lab Results  Component Value Date   NA 140 06/05/2014   K 3.8 06/05/2014   CREATININE 1.05 06/05/2014    Assessment: Blood pressure control: controlled Progress toward BP goal:  at goal Comments: Patient remains compliant w/ HCTZ 25 mg daily + lisinopril 5 mg bid  Plan: Medications:  continue current medications Educational resources provided: brochure Self management tools provided: home blood pressure logbook Other plans: RTC in 3 months

## 2014-06-28 NOTE — Progress Notes (Signed)
Subjective:   Patient ID: UMBERTO Pittman male   DOB: 26-Sep-1950 63 y.o.   MRN: [id number]  HPI: [name] is a 63 y.o. male w/ PMHx of HTN, COPD, metastatic Prostate CA, h/o CVA, and seizures, presents to the clinic today for a follow-up visit. The patient was recently discharged from the hospital on 06/06/14 after admission for seizures, GI bleeding, and AKI. Seizures most likely 2/2 non-compliance w/ Keppra. GI bleed possibly 2/2 inflammation of the duodenum as seen by EGD, also associated w/ iron-deficiency and non-compliance w/ iron supplementation.  Today the patient is accompanied by his niece who is looking after him these days. The patient and his niece confirm that he has been doing well recently. No recent dizziness, lightheadedness, seizures, LOC, chest pain, SOB, hematochezia or melena. The patient claims he has been very compliant w/ his medications recently, has not been missing doses of his Keppra.   Past Medical History  Diagnosis Date  . Hypertension 05/18/2007  . COPD 05/18/2007  . Elevated PSA 05/25/2007  . Prostate cancer   . Seizures 12/05/2013; 12/08/2013; 12/11/2013    new onset; recurrent/notes 12/12/2013  . 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 Cerebral embolism with cerebral infarction     residual left sided weakness/notes 12/12/2013  . Protein-calorie malnutrition, severe     [name] 12/12/2013  . Chronic kidney disease (CKD), stage II (mild)     [name] 12/12/2013  . Colon polyps 10/20/2013    TUBULAR ADENOMA (4).   Current Outpatient Prescriptions  Medication Sig Dispense Refill  . albuterol (PROVENTIL HFA;VENTOLIN HFA) 108 (90 BASE) MCG/ACT inhaler Inhale 2 puffs into the lungs every 6 (six) hours as needed for wheezing. 8.5 g 3  . atorvastatin (LIPITOR) 80 MG tablet Take 80 mg by mouth at bedtime.    . chlorhexidine (PERIDEX) 0.12 % solution Use as directed 15 mLs in the mouth or throat daily.    . clopidogrel (PLAVIX) 75 MG tablet Take 1  tablet (75 mg total) by mouth daily with breakfast. 90 tablet 1  . diclofenac sodium (VOLTAREN) 1 % GEL Apply 1 application topically 3 (three) times daily as needed (knee pain).    . ferrous sulfate 325 (65 FE) MG tablet Take 325 mg by mouth daily with breakfast.    . folic acid (FOLVITE) 1 MG tablet Take 1 tablet (1 mg total) by mouth daily. 30 tablet 1  . hydrochlorothiazide (HYDRODIURIL) 25 MG tablet Take 25 mg by mouth daily.    Marland Kitchen levETIRAcetam (KEPPRA) 500 MG tablet Take 1,500 mg by mouth 2 (two) times daily.    Marland Kitchen lisinopril (PRINIVIL,ZESTRIL) 10 MG tablet Take 0.5 tablets (5 mg total) by mouth 2 (two) times daily. 30 tablet 1  . Multiple Vitamin (MULTIVITAMIN WITH MINERALS) TABS tablet Take 1 tablet by mouth daily.    . pantoprazole (PROTONIX) 40 MG tablet Take 1 tablet (40 mg total) by mouth daily. 90 tablet 1  . tiotropium (SPIRIVA HANDIHALER) 18 MCG inhalation capsule Place 1 capsule (18 mcg total) into inhaler and inhale daily. 90 capsule 4   No current facility-administered medications for this visit.    Review of Systems: General: Denies fever, chills, diaphoresis, appetite change and fatigue.  Respiratory: Denies SOB, DOE, cough, and wheezing.   Cardiovascular: Denies chest pain and palpitations.  Gastrointestinal: Denies nausea, vomiting, abdominal pain, and diarrhea.  Genitourinary: Denies dysuria, increased frequency, and flank pain. Musculoskeletal: Positive for gait problem. Denies myalgias, back pain,  joint swelling, arthralgias.  Skin: Denies pallor, rash and wounds.  Neurological: Positive for generalized weakness w/ left-sided residual weakness. Denies dizziness, seizures, syncope, lightheadedness, numbness and headaches.  Psychiatric/Behavioral: Denies mood changes, and sleep disturbances.  Objective:   Physical Exam: Filed Vitals:   06/28/14 1427  BP: 126/69  Pulse: 73  Temp: 97.7 F (36.5 C)  TempSrc: Oral  Height: 5' 8.5" (1.74 m)  Weight: 138 lb 12.8 oz  (62.959 kg)  SpO2: 100%    General: [place] American male, appears older than stated age, alert, cooperative, NAD. HEENT: PERRL, EOMI. Moist mucus membranes. Dry skin on face.  Neck: Full range of motion without pain, supple, no lymphadenopathy or carotid bruits Lungs: Clear to ascultation bilaterally, normal work of respiration, no wheezes, rales, rhonchi Heart: RRR, no murmurs, gallops, or rubs Abdomen: Soft, non-tender, non-distended, BS + Extremities: No cyanosis, clubbing, or edema. Large non-tender subpatellar cystic-like mass 4 cm x 4 cm x 6 cm (chronic).  Neurologic: Alert & oriented X3, cranial nerves II-XII intact. UE strength 5/5 bilaterally, LLE w/ 4/5 strength, RLE 5/5.    Assessment & Plan:   Please see problem based assessment and plan.

## 2014-06-28 NOTE — Assessment & Plan Note (Signed)
Patient still w/ residual weakness, however, says he has been working daily w/ his aide to improve his strength. Compliant w/ Plavix 75 mg daily + Statin + BP meds.  -Continue as above

## 2014-06-28 NOTE — Assessment & Plan Note (Signed)
EGD from recent hospital admission revealed duodenitis, possibly related to acute GI blood loss.  -Refilled Protonix 40 mg qd

## 2014-06-28 NOTE — Patient Instructions (Addendum)
General Instructions:  1. Please schedule a follow up appointment for 3 months  2. Please take all medications as prescribed.   Take Ferrous Sulfate (iron pills) 325 mg twice daily. THIS CAN CAUSE CONSTIPATION. Take Sennakot-S at night only for constipation symptoms.   3. If you have worsening of your symptoms or new symptoms arise, please call the clinic ([phone]), or go to the ER immediately if symptoms are severe.  You have done a great job in taking all your medications. I appreciate it very much. Please continue doing that.    Please bring your medicines with you each time you come to clinic.  Medicines may include prescription medications, over-the-counter medications, herbal remedies, eye drops, vitamins, or other pills.   Progress Toward Treatment Goals:  Treatment Goal 04/10/2014  Blood pressure at goal  Stop smoking stopped smoking  Prevent falls -    Self Care Goals & Plans:  Self Care Goal 06/28/2014  Manage my medications take my medicines as prescribed; bring my medications to every visit; refill my medications on time  Monitor my health -  Eat healthy foods drink diet soda or water instead of juice or soda; eat more vegetables; eat foods that are low in salt; eat baked foods instead of fried foods; eat fruit for snacks and desserts  Be physically active -  Stop smoking -  Meeting treatment goals -    No flowsheet data found.   Care Management & Community Referrals:  Referral 04/10/2014  Referrals made for care management support none needed  Referrals made to community resources none

## 2014-07-16 ENCOUNTER — Emergency Department (HOSPITAL_COMMUNITY): Payer: Medicare Other

## 2014-07-16 ENCOUNTER — Inpatient Hospital Stay (HOSPITAL_COMMUNITY): Payer: Medicare Other

## 2014-07-16 ENCOUNTER — Inpatient Hospital Stay (HOSPITAL_COMMUNITY)
Admission: EM | Admit: 2014-07-16 | Discharge: 2014-07-19 | DRG: 682 | Disposition: A | Discharge status: Skilled Nursing Facility | Payer: Medicare Other | Attending: Internal Medicine | Admitting: Internal Medicine

## 2014-07-16 ENCOUNTER — Encounter (HOSPITAL_COMMUNITY): Payer: Self-pay | Admitting: Emergency Medicine

## 2014-07-16 DIAGNOSIS — R531 Weakness: Secondary | ICD-10-CM | POA: Diagnosis present

## 2014-07-16 DIAGNOSIS — F17210 Nicotine dependence, cigarettes, uncomplicated: Secondary | ICD-10-CM | POA: Diagnosis present

## 2014-07-16 DIAGNOSIS — E860 Dehydration: Secondary | ICD-10-CM

## 2014-07-16 DIAGNOSIS — Z886 Allergy status to analgesic agent status: Secondary | ICD-10-CM | POA: Diagnosis not present

## 2014-07-16 DIAGNOSIS — Z8546 Personal history of malignant neoplasm of prostate: Secondary | ICD-10-CM

## 2014-07-16 DIAGNOSIS — Z66 Do not resuscitate: Secondary | ICD-10-CM | POA: Diagnosis present

## 2014-07-16 DIAGNOSIS — E46 Unspecified protein-calorie malnutrition: Secondary | ICD-10-CM

## 2014-07-16 DIAGNOSIS — R110 Nausea: Secondary | ICD-10-CM

## 2014-07-16 DIAGNOSIS — E872 Acidosis: Secondary | ICD-10-CM

## 2014-07-16 DIAGNOSIS — E8729 Other acidosis: Secondary | ICD-10-CM | POA: Diagnosis present

## 2014-07-16 DIAGNOSIS — N179 Acute kidney failure, unspecified: Principal | ICD-10-CM | POA: Diagnosis present

## 2014-07-16 DIAGNOSIS — D509 Iron deficiency anemia, unspecified: Secondary | ICD-10-CM | POA: Diagnosis present

## 2014-07-16 DIAGNOSIS — I129 Hypertensive chronic kidney disease with stage 1 through stage 4 chronic kidney disease, or unspecified chronic kidney disease: Secondary | ICD-10-CM | POA: Diagnosis present

## 2014-07-16 DIAGNOSIS — Z86010 Personal history of colonic polyps: Secondary | ICD-10-CM

## 2014-07-16 DIAGNOSIS — M899 Disorder of bone, unspecified: Secondary | ICD-10-CM

## 2014-07-16 DIAGNOSIS — R059 Cough, unspecified: Secondary | ICD-10-CM

## 2014-07-16 DIAGNOSIS — N182 Chronic kidney disease, stage 2 (mild): Secondary | ICD-10-CM | POA: Diagnosis present

## 2014-07-16 DIAGNOSIS — R05 Cough: Secondary | ICD-10-CM

## 2014-07-16 DIAGNOSIS — R9389 Abnormal findings on diagnostic imaging of other specified body structures: Secondary | ICD-10-CM

## 2014-07-16 DIAGNOSIS — Z8673 Personal history of transient ischemic attack (TIA), and cerebral infarction without residual deficits: Secondary | ICD-10-CM | POA: Diagnosis not present

## 2014-07-16 DIAGNOSIS — R7989 Other specified abnormal findings of blood chemistry: Secondary | ICD-10-CM

## 2014-07-16 DIAGNOSIS — I10 Essential (primary) hypertension: Secondary | ICD-10-CM

## 2014-07-16 DIAGNOSIS — R630 Anorexia: Secondary | ICD-10-CM

## 2014-07-16 DIAGNOSIS — R569 Unspecified convulsions: Secondary | ICD-10-CM

## 2014-07-16 DIAGNOSIS — N189 Chronic kidney disease, unspecified: Secondary | ICD-10-CM

## 2014-07-16 DIAGNOSIS — E43 Unspecified severe protein-calorie malnutrition: Secondary | ICD-10-CM | POA: Diagnosis present

## 2014-07-16 DIAGNOSIS — Z6820 Body mass index (BMI) 20.0-20.9, adult: Secondary | ICD-10-CM

## 2014-07-16 DIAGNOSIS — J449 Chronic obstructive pulmonary disease, unspecified: Secondary | ICD-10-CM | POA: Diagnosis present

## 2014-07-16 DIAGNOSIS — Z515 Encounter for palliative care: Secondary | ICD-10-CM

## 2014-07-16 DIAGNOSIS — Z880 Allergy status to penicillin: Secondary | ICD-10-CM | POA: Diagnosis not present

## 2014-07-16 DIAGNOSIS — Z608 Other problems related to social environment: Secondary | ICD-10-CM

## 2014-07-16 DIAGNOSIS — I69322 Dysarthria following cerebral infarction: Secondary | ICD-10-CM | POA: Diagnosis not present

## 2014-07-16 DIAGNOSIS — R634 Abnormal weight loss: Secondary | ICD-10-CM

## 2014-07-16 DIAGNOSIS — C61 Malignant neoplasm of prostate: Secondary | ICD-10-CM | POA: Diagnosis present

## 2014-07-16 DIAGNOSIS — Z8669 Personal history of other diseases of the nervous system and sense organs: Secondary | ICD-10-CM

## 2014-07-16 LAB — CBC
HCT: 30.7 % — ABNORMAL LOW (ref 39.0–52.0)
HEMOGLOBIN: 9.6 g/dL — AB (ref 13.0–17.0)
MCH: 23.9 pg — ABNORMAL LOW (ref 26.0–34.0)
MCHC: 31.3 g/dL (ref 30.0–36.0)
MCV: 76.6 fL — ABNORMAL LOW (ref 78.0–100.0)
Platelets: 302 10*3/uL (ref 150–400)
RBC: 4.01 MIL/uL — ABNORMAL LOW (ref 4.22–5.81)
RDW: 21.2 % — ABNORMAL HIGH (ref 11.5–15.5)
WBC: 9.1 10*3/uL (ref 4.0–10.5)

## 2014-07-16 LAB — RAPID URINE DRUG SCREEN, HOSP PERFORMED
Amphetamines: NOT DETECTED
BARBITURATES: NOT DETECTED
Benzodiazepines: NOT DETECTED
Cocaine: NOT DETECTED
Opiates: NOT DETECTED
TETRAHYDROCANNABINOL: NOT DETECTED

## 2014-07-16 LAB — PROTIME-INR
INR: 1.17 (ref 0.00–1.49)
Prothrombin Time: 15 seconds (ref 11.6–15.2)

## 2014-07-16 LAB — COMPREHENSIVE METABOLIC PANEL
ALK PHOS: 70 U/L (ref 39–117)
ALT: 14 U/L (ref 0–53)
AST: 13 U/L (ref 0–37)
Albumin: 3.8 g/dL (ref 3.5–5.2)
Anion gap: 26 — ABNORMAL HIGH (ref 5–15)
BUN: 99 mg/dL — ABNORMAL HIGH (ref 6–23)
CO2: 14 meq/L — AB (ref 19–32)
Calcium: 10.3 mg/dL (ref 8.4–10.5)
Chloride: 103 mEq/L (ref 96–112)
Creatinine, Ser: 7.29 mg/dL — ABNORMAL HIGH (ref 0.50–1.35)
GFR calc Af Amer: 8 mL/min — ABNORMAL LOW (ref >90)
GFR, EST NON AFRICAN AMERICAN: 7 mL/min — AB (ref >90)
GLUCOSE: 88 mg/dL (ref 70–99)
Potassium: 4.1 mEq/L (ref 3.7–5.3)
SODIUM: 143 meq/L (ref 137–147)
Total Bilirubin: 0.2 mg/dL — ABNORMAL LOW (ref 0.3–1.2)
Total Protein: 8.9 g/dL — ABNORMAL HIGH (ref 6.0–8.3)

## 2014-07-16 LAB — BASIC METABOLIC PANEL
Anion gap: 22 — ABNORMAL HIGH (ref 5–15)
BUN: 102 mg/dL — AB (ref 6–23)
CO2: 14 mEq/L — ABNORMAL LOW (ref 19–32)
CREATININE: 6.51 mg/dL — AB (ref 0.50–1.35)
Calcium: 9.5 mg/dL (ref 8.4–10.5)
Chloride: 106 mEq/L (ref 96–112)
GFR calc Af Amer: 9 mL/min — ABNORMAL LOW (ref >90)
GFR, EST NON AFRICAN AMERICAN: 8 mL/min — AB (ref >90)
GLUCOSE: 129 mg/dL — AB (ref 70–99)
POTASSIUM: 3.7 meq/L (ref 3.7–5.3)
Sodium: 142 mEq/L (ref 137–147)

## 2014-07-16 LAB — URINALYSIS, ROUTINE W REFLEX MICROSCOPIC
BILIRUBIN URINE: NEGATIVE
Glucose, UA: NEGATIVE mg/dL
Hgb urine dipstick: NEGATIVE
Ketones, ur: NEGATIVE mg/dL
LEUKOCYTES UA: NEGATIVE
NITRITE: NEGATIVE
PH: 5 (ref 5.0–8.0)
Protein, ur: NEGATIVE mg/dL
Specific Gravity, Urine: 1.018 (ref 1.005–1.030)
UROBILINOGEN UA: 0.2 mg/dL (ref 0.0–1.0)

## 2014-07-16 LAB — PHOSPHORUS: PHOSPHORUS: 5.5 mg/dL — AB (ref 2.3–4.6)

## 2014-07-16 LAB — DIFFERENTIAL
BASOS PCT: 0 % (ref 0–1)
Basophils Absolute: 0 10*3/uL (ref 0.0–0.1)
Eosinophils Absolute: 0 10*3/uL (ref 0.0–0.7)
Eosinophils Relative: 0 % (ref 0–5)
LYMPHS PCT: 23 % (ref 12–46)
Lymphs Abs: 2.1 10*3/uL (ref 0.7–4.0)
MONOS PCT: 4 % (ref 3–12)
Monocytes Absolute: 0.4 10*3/uL (ref 0.1–1.0)
Neutro Abs: 6.6 10*3/uL (ref 1.7–7.7)
Neutrophils Relative %: 73 % (ref 43–77)
SMEAR REVIEW: ADEQUATE

## 2014-07-16 LAB — SODIUM, URINE, RANDOM: SODIUM UR: 31 meq/L

## 2014-07-16 LAB — ETHANOL: Alcohol, Ethyl (B): <11 mg/dL (ref 0–11)

## 2014-07-16 LAB — MRSA PCR SCREENING: MRSA by PCR: NEGATIVE

## 2014-07-16 LAB — CREATININE, URINE, RANDOM: CREATININE, URINE: 289.98 mg/dL

## 2014-07-16 LAB — APTT: APTT: 26 s (ref 24–37)

## 2014-07-16 LAB — TROPONIN I: Troponin I: <0.3 ng/mL (ref <0.30)

## 2014-07-16 MED ORDER — HEPARIN SODIUM (PORCINE) 5000 UNIT/ML IJ SOLN
5000.0000 [IU] | Freq: Three times a day (TID) | INTRAMUSCULAR | Status: DC
  Administered 2014-07-16 – 2014-07-19 (×10): 5000 [IU] via SUBCUTANEOUS
  Filled 2014-07-16 (×9): qty 1

## 2014-07-16 MED ORDER — PRO-STAT SUGAR FREE PO LIQD
30.0000 mL | Freq: Every day | ORAL | Status: DC
  Administered 2014-07-16 – 2014-07-19 (×4): 30 mL via ORAL
  Filled 2014-07-16 (×3): qty 30

## 2014-07-16 MED ORDER — PANTOPRAZOLE SODIUM 40 MG PO TBEC
40.0000 mg | DELAYED_RELEASE_TABLET | Freq: Every day | ORAL | Status: DC
  Administered 2014-07-16 – 2014-07-19 (×4): 40 mg via ORAL
  Filled 2014-07-16 (×4): qty 1

## 2014-07-16 MED ORDER — CLOPIDOGREL BISULFATE 75 MG PO TABS
75.0000 mg | ORAL_TABLET | Freq: Every day | ORAL | Status: DC
  Administered 2014-07-16 – 2014-07-19 (×4): 75 mg via ORAL
  Filled 2014-07-16 (×5): qty 1

## 2014-07-16 MED ORDER — TIOTROPIUM BROMIDE MONOHYDRATE 18 MCG IN CAPS
18.0000 ug | ORAL_CAPSULE | Freq: Every day | RESPIRATORY_TRACT | Status: DC
  Administered 2014-07-17 – 2014-07-19 (×2): 18 ug via RESPIRATORY_TRACT
  Filled 2014-07-16 (×3): qty 5

## 2014-07-16 MED ORDER — SODIUM BICARBONATE 650 MG PO TABS
650.0000 mg | ORAL_TABLET | Freq: Two times a day (BID) | ORAL | Status: DC
  Administered 2014-07-16 – 2014-07-18 (×5): 650 mg via ORAL
  Filled 2014-07-16 (×7): qty 1

## 2014-07-16 MED ORDER — LEVETIRACETAM 750 MG PO TABS
1500.0000 mg | ORAL_TABLET | Freq: Two times a day (BID) | ORAL | Status: DC
  Administered 2014-07-16 – 2014-07-17 (×3): 1500 mg via ORAL
  Filled 2014-07-16 (×4): qty 2

## 2014-07-16 MED ORDER — SODIUM CHLORIDE 0.9 % IV SOLN
INTRAVENOUS | Status: AC
  Administered 2014-07-16 – 2014-07-18 (×5): via INTRAVENOUS

## 2014-07-16 MED ORDER — ALBUTEROL SULFATE (2.5 MG/3ML) 0.083% IN NEBU: 3.0000 mL | INHALATION_SOLUTION | Freq: Four times a day (QID) | RESPIRATORY_TRACT | Status: DC | PRN

## 2014-07-16 MED ORDER — ATORVASTATIN CALCIUM 80 MG PO TABS
80.0000 mg | ORAL_TABLET | Freq: Every day | ORAL | Status: DC
  Administered 2014-07-16 – 2014-07-18 (×3): 80 mg via ORAL
  Filled 2014-07-16 (×4): qty 1

## 2014-07-16 MED ORDER — SODIUM CHLORIDE 0.9 % IV BOLUS (SEPSIS)
1000.0000 mL | Freq: Once | INTRAVENOUS | Status: AC
  Administered 2014-07-16: 1000 mL via INTRAVENOUS

## 2014-07-16 MED ORDER — SODIUM BICARBONATE 650 MG PO TABS: 650.0000 mg | ORAL_TABLET | Freq: Two times a day (BID) | ORAL | Status: DC

## 2014-07-16 MED ORDER — NEPRO/CARBSTEADY PO LIQD
237.0000 mL | Freq: Two times a day (BID) | ORAL | Status: DC
  Administered 2014-07-16 – 2014-07-19 (×7): 237 mL via ORAL

## 2014-07-16 MED ORDER — ONDANSETRON HCL 4 MG/2ML IJ SOLN
4.0000 mg | Freq: Once | INTRAMUSCULAR | Status: AC
  Administered 2014-07-16: 4 mg via INTRAVENOUS
  Filled 2014-07-16: qty 2

## 2014-07-16 MED ORDER — LACTATED RINGERS IV SOLN
INTRAVENOUS | Status: DC
  Administered 2014-07-16: 05:00:00 via INTRAVENOUS

## 2014-07-16 MED ORDER — FERROUS SULFATE 325 (65 FE) MG PO TABS
325.0000 mg | ORAL_TABLET | Freq: Two times a day (BID) | ORAL | Status: DC
  Administered 2014-07-16 – 2014-07-19 (×7): 325 mg via ORAL
  Filled 2014-07-16 (×9): qty 1

## 2014-07-16 MED ORDER — FOLIC ACID 1 MG PO TABS
1.0000 mg | ORAL_TABLET | Freq: Every day | ORAL | Status: DC
  Administered 2014-07-16 – 2014-07-19 (×4): 1 mg via ORAL
  Filled 2014-07-16 (×4): qty 1

## 2014-07-16 MED ORDER — ADULT MULTIVITAMIN W/MINERALS CH
1.0000 | ORAL_TABLET | Freq: Every day | ORAL | Status: DC
  Administered 2014-07-16 – 2014-07-19 (×4): 1 via ORAL
  Filled 2014-07-16 (×5): qty 1

## 2014-07-16 MED ORDER — IPRATROPIUM-ALBUTEROL 0.5-2.5 (3) MG/3ML IN SOLN
3.0000 mL | RESPIRATORY_TRACT | Status: DC
  Administered 2014-07-16 – 2014-07-17 (×5): 3 mL via RESPIRATORY_TRACT
  Filled 2014-07-16 (×5): qty 3

## 2014-07-16 NOTE — Evaluation (Signed)
Clinical/Bedside Swallow Evaluation Patient Details  Name: [name] MRN: [id number] Date of Birth: 1950-09-21  Today's Date: 07/16/2014 Time: 1004-1029 SLP Time Calculation (min) (ACUTE ONLY): 25 min  Past Medical History:  Past Medical History  Diagnosis Date  . Hypertension 05/18/2007  . COPD 05/18/2007  . Elevated PSA 05/25/2007  . Prostate cancer   . Seizures 12/05/2013; 12/08/2013; 12/11/2013    new onset; recurrent/notes 12/12/2013  . 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 Cerebral embolism with cerebral infarction     residual left sided weakness/notes 12/12/2013  . Protein-calorie malnutrition, severe     [name] 12/12/2013  . Chronic kidney disease (CKD), stage II (mild)     [name] 12/12/2013  . Colon polyps 10/20/2013    TUBULAR ADENOMA (4).   Past Surgical History:  Past Surgical History  Procedure Laterality Date  . 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 Multiple extractions with alveoloplasty N/A 10/23/2013    Procedure: Extraction of tooth #'s 1,2,3,4,5,6,7,11,12,13,14,15,16,20,21,22,23,24,27,28,29,32 with alveoloplasty and bilateral maxillary tuberosity reductions;  Surgeon: [name], DDS;  Location: [place];  Service: Oral Surgery;  Laterality: N/A;  . Tibia fracture surgery Right     pin  . Tonsillectomy    . Colonoscopy w/ biopsies  2015    4 polyps, tubular adenomas, pan diverticulosis, one lipoma.   . Peg tube placement  12/2012    per IR  . Esophagogastroduodenoscopy N/A 06/06/2014    Procedure: ESOPHAGOGASTRODUODENOSCOPY (EGD);  Surgeon: Terry Mayer, MD;  Location[facility];  Service: Endoscopy;  Laterality: N/A;   HPI:  63 yo man w/ PMHx of HTN, COPD, hx of multiple CVAs (2012, 2014, most recent on 12/22/13 with residual dysarthria and left-sided weakness), seizures, CKD Stage II, and history of metastatic prostate cancer admitted with 1 week history of generalized weakness and inability to eat for almost 1 week due to lethargy  (too tired to make food).  CT Head was performed which showed evidence of old cerebellar infarcts, but no acute intracranial abnormalities. CXR showed emphysematous changes in the lungs. No focal pulmonary nodules. Multiple small round sclerotic lesions demonstrated in the ribs and thoracic spine, worrisome for metastatic prostate cancersmall nodular opacities in the lungs. MBS 12/14/12 showed cervical esophageal impairments also observed; however, are suspected to be baseline due to observed boney prominences on cervical spine. Intermittent episodes of penetration from pharyngeal residue.  Dys 2, thin recommended with multiple and effortful swallow and hard cough, no straws.    Assessment / Plan / Recommendation Clinical Impression  Pt noted to have pan of expectorated mucous at bedside that cousin stated is his baseline. Family denies significant coughing/throat clearing during or after meals prior to hospitalization.  He has suspected cervical osteophytes noted on a prior MBS.  Inconsistent and delayed throat clears, audible swallow and multiple swallows present throughout assessment. CXR noted emphysematous changes and  lesions demonstrated in the ribs and thoracic spine, worrisome for metastatic prostate cancersmall nodular opacities in the lungs.  Therapeutic portion of eval included review/education of findings from [facility] 2014, including clinical reasoning for residue and multiple swallows.  Recommend downgrade diet to Dys 3 due to softer diet at baseline and thin liquids, no straws with continued ST.       Aspiration Risk   (mod-severe)    Diet Recommendation Dysphagia 3 (Mechanical Soft);Thin liquid   Liquid Administration via: Cup;No straw Medication Administration: Whole meds with puree Supervision: Patient able to self feed;Full  supervision/cueing for compensatory strategies Compensations: Slow rate;Small sips/bites;Multiple dry swallows after each bite/sip;Clear throat after each  swallow Postural Changes and/or Swallow Maneuvers: Seated upright 90 degrees;Upright 30-60 min after meal    Other  Recommendations Oral Care Recommendations: Oral care BID   Follow Up Recommendations   (TBD)    Frequency and Duration min 2x/week  2 weeks   Pertinent Vitals/Pain none         Swallow Study          Oral/Motor/Sensory Function Overall Oral Motor/Sensory Function: Appears within functional limits for tasks assessed   Ice Chips Ice chips: Not tested   Thin Liquid Thin Liquid: Impaired Presentation: Cup;Straw Pharyngeal  Phase Impairments: Throat Clearing - Delayed;Multiple swallows (audible swallow)    Nectar Thick Nectar Thick Liquid: Not tested   Honey Thick Honey Thick Liquid: Not tested   Puree Puree: Impaired Pharyngeal Phase Impairments: Multiple swallows   Solid   GO    Solid: Not tested       [name] 07/16/2014,12:16 PM  [name].Ed [employer] [phone]

## 2014-07-16 NOTE — ED Provider Notes (Signed)
CSN: [id number]     Arrival date & time 07/16/14  0007 History   First MD Initiated Contact with Patient 07/16/14 0008     Chief Complaint  Patient presents with  . Extremity Weakness   Level V caveat for dysarthria  (Consider location/radiation/quality/duration/timing/severity/associated sxs/prior Treatment) HPI  Patient has had a stroke and has some dysarthria with difficulty understanding his speech. He is able to answer yes or no questions very well. Looking at patient's prior records he had an acute stroke in March and April. He has chronic left-sided weakness. However he states yesterday he noted the weakness seemed to be getting worse. His family called EMS tonight them he has been having worsening left-sided weakness for the past 3 days. Patient states he has had nausea and has not been eating as well. He denies having any pain including headache, chest pain, or abdominal pain. He denies any change in his vision. He also states he has been coughing and having hiccups today. He is unaware of any fever.  PCP Dr Ronnald Ramp  Past Medical History  Diagnosis Date  . Hypertension 05/18/2007  . COPD 05/18/2007  . Elevated PSA 05/25/2007  . Prostate cancer   . Seizures 12/05/2013; 12/08/2013; 12/11/2013    new onset; recurrent/notes 12/12/2013  . 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 Cerebral embolism with cerebral infarction     residual left sided weakness/notes 12/12/2013  . Protein-calorie malnutrition, severe     [name] 12/12/2013  . Chronic kidney disease (CKD), stage II (mild)     [name] 12/12/2013  . Colon polyps 10/20/2013    TUBULAR ADENOMA (4).   Past Surgical History  Procedure Laterality Date  . 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 Multiple extractions with alveoloplasty N/A 10/23/2013    Procedure: Extraction of tooth #'s 1,2,3,4,5,6,7,11,12,13,14,15,16,20,21,22,23,24,27,28,29,32 with alveoloplasty and bilateral maxillary tuberosity reductions;  Surgeon: Lenn Cal, DDS;  Location: [facility];  Service: Oral Surgery;  Laterality: N/A;  . Tibia fracture surgery Right     pin  . Tonsillectomy    . Colonoscopy w/ biopsies  2015    4 polyps, tubular adenomas, pan diverticulosis, one lipoma.   . Peg tube placement  12/2012    per IR  . Esophagogastroduodenoscopy N/A 06/06/2014    Procedure: ESOPHAGOGASTRODUODENOSCOPY (EGD);  Surgeon: Gatha Mayer, MD;  Location: [facility] ENDOSCOPY;  Service: Endoscopy;  Laterality: N/A;   Family History  Problem Relation Age of Onset  . Liver disease Mother   . Heart attack Mother 47  . Colon cancer Neg Hx    History  Substance Use Topics  . Smoking status: Current Some Day Smoker -- 0.10 packs/day for 50 years    Types: Cigarettes  . Smokeless tobacco: Never Used     Comment: 1/2 ppd hx, now 3-4 cigs/day. Form given 05-14-14  . Alcohol Use: 0.0 oz/week    0 Cans of beer per week  lives at home States he has a CNA come daily  Review of Systems  All other systems reviewed and are negative.     Allergies  Aspirin; Penicillins; and Other  Home Medications   Prior to Admission medications   Medication Sig Start Date End Date Taking? Authorizing Provider  albuterol (PROVENTIL HFA;VENTOLIN HFA) 108 (90 BASE) MCG/ACT inhaler Inhale 2 puffs into the lungs every 6 (six) hours as needed for wheezing. 10/10/13   Corky Sox, MD  atorvastatin (LIPITOR) 80 MG tablet Take 80 mg  by mouth at bedtime.    Historical Provider, MD  chlorhexidine (PERIDEX) 0.12 % solution Use as directed 15 mLs in the mouth or throat daily.    Historical Provider, MD  clopidogrel (PLAVIX) 75 MG tablet Take 1 tablet (75 mg total) by mouth daily with breakfast. 06/28/14   Corky Sox, MD  diclofenac sodium (VOLTAREN) 1 % GEL Apply 1 application topically 3 (three) times daily as needed (knee pain).    Historical Provider, MD  ferrous sulfate 325 (65 FE) MG tablet Take 1 tablet (325 mg total) by mouth 2 (two) times daily with a meal. 06/28/14    Corky Sox, MD  folic acid (FOLVITE) 1 MG tablet Take 1 tablet (1 mg total) by mouth daily. 06/28/14   Corky Sox, MD  hydrochlorothiazide (HYDRODIURIL) 25 MG tablet Take 25 mg by mouth daily.    Historical Provider, MD  levETIRAcetam (KEPPRA) 500 MG tablet Take 1,500 mg by mouth 2 (two) times daily.    Historical Provider, MD  lisinopril (PRINIVIL,ZESTRIL) 10 MG tablet Take 0.5 tablets (5 mg total) by mouth 2 (two) times daily. 06/28/14   Corky Sox, MD  Multiple Vitamin (MULTIVITAMIN WITH MINERALS) TABS tablet Take 1 tablet by mouth daily.    Historical Provider, MD  pantoprazole (PROTONIX) 40 MG tablet Take 1 tablet (40 mg total) by mouth daily. 06/28/14   Corky Sox, MD  senna-docusate (SENOKOT-S) 8.6-50 MG per tablet Take 1 tablet by mouth at bedtime as needed for mild constipation. 06/28/14   Corky Sox, MD  tiotropium (SPIRIVA HANDIHALER) 18 MCG inhalation capsule Place 1 capsule (18 mcg total) into inhaler and inhale daily. 04/04/14 04/03/15  Corky Sox, MD  zoster vaccine live, PF, (ZOSTAVAX) 29937 UNT/0.65ML injection Inject 19,400 Units into the skin once. 06/28/14   Corky Sox, MD   BP 104/63 mmHg  Pulse 85  Temp(Src) 97.5 F (36.4 C) (Oral)  Resp 18  Ht 5' 8.5" (1.74 m)  Wt 140 lb (63.504 kg)  BMI 20.98 kg/m2  SpO2 100%  Vital signs normal   Physical Exam  Constitutional: He is oriented to person, place, and time.  Non-toxic appearance. He does not appear ill. No distress.  Thin male who is spitting up clear fluid, sometimes coughing. At the end of my interview patient started having hiccups which he states he's had all day.  HENT:  Head: Normocephalic and atraumatic.  Right Ear: External ear normal.  Left Ear: External ear normal.  Nose: Nose normal. No mucosal edema or rhinorrhea.  Mouth/Throat: Oropharynx is clear and moist and mucous membranes are normal. No dental abscesses or uvula swelling.  Eyes: Conjunctivae and EOM are normal. Pupils are equal,  round, and reactive to light.  Neck: Normal range of motion and full passive range of motion without pain. Neck supple.  Cardiovascular: Normal rate, regular rhythm and normal heart sounds.  Exam reveals no gallop and no friction rub.   No murmur heard. Pulmonary/Chest: Effort normal and breath sounds normal. No respiratory distress. He has no wheezes. He has no rhonchi. He has no rales. He exhibits no tenderness and no crepitus.  Abdominal: Soft. Normal appearance and bowel sounds are normal. He exhibits no distension. There is no tenderness. There is no rebound and no guarding.  Musculoskeletal: Normal range of motion. He exhibits no edema or tenderness.  No deformity noted  Neurological: He is alert and oriented to person, place, and time. He has normal strength. No cranial  nerve deficit.  Pt has some difficulty raising his LUE against gravity. Pt can lift his LLE against gravity but not as easily as the right and he cannot maintain it more than a few seconds.  Pt has some dysarthria with difficulty understanding his speech  Skin: Skin is warm, dry and intact. No rash noted. No erythema. No pallor.  Psychiatric: He has a normal mood and affect. His speech is normal and behavior is normal. His mood appears not anxious.  Nursing note and vitals reviewed.   ED Course  Procedures (including critical care time)  Medications  lactated ringers infusion ( Intravenous Transfusing/Transfer 07/16/14 0446)  ondansetron (ZOFRAN) injection 4 mg (4 mg Intravenous Given 07/16/14 0226)  sodium chloride 0.9 % bolus 1,000 mL (0 mLs Intravenous Stopped 07/16/14 0430)   On patient's arrival he had the code stroke protocol followed except for calling the code stroke. His symptoms started according to him yesterday or per EMS his family states 3 days ago. His head CT does not show any acute findings. MRI is unavailable at this time and night.  After reviewing patient's chest x-ray CT scan of the chest was ordered  as recommended by the radiologist. However after was ordered his creatinine came back and was very elevated. His CT scan with contrast was changed to a CT scan of the chest without.  After reviewing his creatinine patient was started on IV fluid hydration for presumed dehydration from his nausea and inability to eat or drink for the last couple of days. These results have been discussed with the patient. He is agreeable to admission.  I have not discussed the results of patient's chest CT with the patient yet.  03:05 Dr Alice Rieger, [facility], admit to tele, Dr Lynnae January attending  Labs Review Results for orders placed or performed during the hospital encounter of 07/16/14  Ethanol  Result Value Ref Range   Alcohol, Ethyl (B) <11 0 - 11 mg/dL  Protime-INR  Result Value Ref Range   Prothrombin Time 15.0 11.6 - 15.2 seconds   INR 1.17 0.00 - 1.49  APTT  Result Value Ref Range   aPTT 26 24 - 37 seconds  CBC  Result Value Ref Range   WBC 9.1 4.0 - 10.5 K/uL   RBC 4.01 (L) 4.22 - 5.81 MIL/uL   Hemoglobin 9.6 (L) 13.0 - 17.0 g/dL   HCT 30.7 (L) 39.0 - 52.0 %   MCV 76.6 (L) 78.0 - 100.0 fL   MCH 23.9 (L) 26.0 - 34.0 pg   MCHC 31.3 30.0 - 36.0 g/dL   RDW 21.2 (H) 11.5 - 15.5 %   Platelets 302 150 - 400 K/uL  Differential  Result Value Ref Range   Neutrophils Relative % 73 43 - 77 %   Lymphocytes Relative 23 12 - 46 %   Monocytes Relative 4 3 - 12 %   Eosinophils Relative 0 0 - 5 %   Basophils Relative 0 0 - 1 %   Neutro Abs 6.6 1.7 - 7.7 K/uL   Lymphs Abs 2.1 0.7 - 4.0 K/uL   Monocytes Absolute 0.4 0.1 - 1.0 K/uL   Eosinophils Absolute 0.0 0.0 - 0.7 K/uL   Basophils Absolute 0.0 0.0 - 0.1 K/uL   RBC Morphology ELLIPTOCYTES    Smear Review PLATELETS APPEAR ADEQUATE   Comprehensive metabolic panel  Result Value Ref Range   Sodium 143 137 - 147 mEq/L   Potassium 4.1 3.7 - 5.3 mEq/L   Chloride 103 96 -  112 mEq/L   CO2 14 (L) 19 - 32 mEq/L   Glucose, Bld 88 70 - 99 mg/dL   BUN 99 (H) 6 -  23 mg/dL   Creatinine, Ser 7.29 (H) 0.50 - 1.35 mg/dL   Calcium 10.3 8.4 - 10.5 mg/dL   Total Protein 8.9 (H) 6.0 - 8.3 g/dL   Albumin 3.8 3.5 - 5.2 g/dL   AST 13 0 - 37 U/L   ALT 14 0 - 53 U/L   Alkaline Phosphatase 70 39 - 117 U/L   Total Bilirubin 0.2 (L) 0.3 - 1.2 mg/dL   GFR calc non Af Amer 7 (L) >90 mL/min   GFR calc Af Amer 8 (L) >90 mL/min   Anion gap 26 (H) 5 - 15  Troponin I  Result Value Ref Range   Troponin I <0.30 <0.30 ng/mL   Laboratory interpretation all normal except acute renal failure since October 27, stable anemia, new metabolic acidosis in association with his renal failure      Imaging Review Ct Head Wo Contrast  07/16/2014   CLINICAL DATA:  Increase weakness to the left side for 3 days. Stroke about a month ago.  EXAM: CT HEAD WITHOUT CONTRAST  TECHNIQUE: Contiguous axial images were obtained from the base of the skull through the vertex without intravenous contrast.  COMPARISON:  MRI brain 06/05/2014.  CT head 12/02/2013.  FINDINGS: Diffuse cerebral atrophy. Low-attenuation changes in the deep white matter consistent with small vessel ischemia. Old right inferior cerebellar infarcts. No mass effect or midline shift. No abnormal extra-axial fluid collections. Gray-white matter junctions are distinct. Basal cisterns are not effaced. No evidence of acute intracranial hemorrhage. No depressed skull fractures. Opacification of the left maxillary antrum with mucosal thickening in the ethmoid air cells and sphenoid sinuses. Mastoid air cells are not opacified. Vascular calcifications.  IMPRESSION: No acute intracranial abnormalities. Chronic atrophy and small vessel ischemic changes. Old right inferior cerebellar infarcts. Opacification in the paranasal sinuses.   Electronically Signed   By: Lucienne Capers M.D.   On: 07/16/2014 01:24   Ct Chest Wo Contrast  07/16/2014   CLINICAL DATA:  Increasing weakness to the left side for 3 days. Stroke 1 month ago. Nodules on  chest x-ray.  EXAM: CT CHEST WITHOUT CONTRAST  TECHNIQUE: Multidetector CT imaging of the chest was performed following the standard protocol without IV contrast.  COMPARISON:  Chest radiograph 07/16/2014 and 11/01/2013.  FINDINGS: Normal heart size. Dilated ascending thoracic aorta at 3.7 cm diameter. Calcification of coronary arteries and aorta. Esophagus is decompressed. No significant lymphadenopathy in the chest.  Diffuse emphysematous changes in the lungs. No significant pulmonary nodules are identified. Changes on chest radiograph likely represent artifact or bone lesions. No focal airspace disease or consolidation. No pneumothorax. No pleural effusions. Airways appear patent.  Multiple rounded sclerotic lesions demonstrated throughout the bony structures, including thoracic vertebrae and multiple bilateral ribs. These changes likely account for the nodular opacities demonstrated in the chest radiograph. Findings would be worrisome for bone metastases. Consider correlation with PSA for possible prostate cancer metastases.  IMPRESSION: Emphysematous changes in the lungs. No focal pulmonary nodules. Multiple small round sclerotic lesions demonstrated in the ribs and thoracic spine, worrisome for metastatic prostate cancer. The pulmonary nodules seen on chest radiograph likely correspond to sclerotic rib lesions.   Electronically Signed   By: Lucienne Capers M.D.   On: 07/16/2014 02:39   Dg Chest Portable 1 View  07/16/2014   CLINICAL DATA:  Cough, weakness,  nausea and vomiting.  EXAM: PORTABLE CHEST - 1 VIEW  COMPARISON:  11/01/2013  FINDINGS: Shallow inspiration. Normal heart size and pulmonary vascularity. Small nodular opacities are again demonstrated in both lungs. CT again suggested for further evaluation unless previously performed an outside institution in the interval. No focal airspace disease or consolidation. No blunting of costophrenic angles. No pneumothorax. Calcified and tortuous aorta.   IMPRESSION: Small nodular opacities again demonstrated in the lungs. CT again recommended for further evaluation. No evidence of active pulmonary disease.   Electronically Signed   By: Lucienne Capers M.D.   On: 07/16/2014 00:53     EKG Interpretation None      Date: 07/16/2014  Rate: 83  Rhythm: normal sinus rhythm  QRS Axis: LAD  Intervals: QT prolonged  ST/T Wave abnormalities: nonspecific ST/T changes  Conduction Disutrbances:IRBBB  Narrative Interpretation: old anteroseptal infarct  Old EKG Reviewed: unchanged from 03 Jun 2014   MDM   patient with past history of 2 strokes in the past year with left-sided weakness reports nausea with decreased oral intake over the past couple days. He also expresses increasing weakness especially on his left side. Patient has new significant acute renal failure since laboratory tests done on October 27. His CT scan tonight does not show any acute stroke. Patient was started on IV fluid hydration. Hopefully his renal failure will improve with fluids.    Final diagnoses:  Cough  Weakness  Abnormal chest x-ray  Nausea  Acute renal failure, unspecified acute renal failure type  Dehydration  Radiodense bone lesion present on X-ray    Plan admission   Rolland Porter, MD, Alanson Aly, MD 07/16/14 [phone]

## 2014-07-16 NOTE — ED Notes (Signed)
Dr.Knapp at bedside  

## 2014-07-16 NOTE — ED Notes (Signed)
IV team at bedside 

## 2014-07-16 NOTE — Evaluation (Signed)
Occupational Therapy Evaluation and Discharge Patient Details Name: [name] MRN: [id number] DOB: May 23, 1951 Today's Date: 07/16/2014    History of Present Illness Pt is a 63 y/o man w/ PMHx of HTN, COPD, hx of multiple CVAs (2012, 2014, most recent on 12/22/13 with residual dysarthria and left-sided weakness), seizures, CKD Stage II, and history of metastatic prostate cancer (bone scan on 12/26/12 shows increased uptake at T6 vertebra) who presents to the ED with a 1 week history of generalized weakness. Patient reports he has had increased tiredness and weakness. He has been unable to eat for almost 1 week. He states he is too tired to get out of bed to make food for himself. He states he lives with his cousin, but they have been unable to care for him because they have been hospitalized. He reports a dry cough and weight loss. CT of the chest showed multiple small round sclerotic lesions in the ribs and thoracic spine concerning for metastatic disease.    Clinical Impression   This 63 yo male admitted with above presents to acute OT with decreased mobility and decreased balance, both affecting his ability to care for himself and he does not have adequate support at home to A him. He will benefit from continued OT at [facility]. We will sign off.    Follow Up Recommendations  SNF    Equipment Recommendations   (TBD at next venue)       Precautions / Restrictions Precautions Precautions: Fall Restrictions Weight Bearing Restrictions: No      Mobility Bed Mobility Overal bed mobility: Needs Assistance Bed Mobility: Sit to Supine     Sit to supine: Min assist   General bed mobility comments: VCs on sequence to help slide himself up in bed  Transfers Overall transfer level: Needs assistance Equipment used: Rolling walker (2 wheeled) Transfers: Sit to/from Stand Sit to Stand: Mod assist Stand pivot transfers: +2 physical assistance;Mod assist          Balance Overall balance  assessment: Needs assistance Sitting-balance support: Feet supported;No upper extremity supported Sitting balance-Leahy Scale: Fair     Standing balance support: Bilateral upper extremity supported Standing balance-Leahy Scale: Poor                              ADL Overall ADL's : Needs assistance/impaired Eating/Feeding: Set up;Sitting   Grooming: Set up;Sitting   Upper Body Bathing: Set up;Sitting   Lower Body Bathing: Moderate assistance (with mod A sit<>stand)   Upper Body Dressing : Minimal assistance;Sitting   Lower Body Dressing: Maximal assistance (with mod A sit<>stand)   Toilet Transfer: Moderate assistance;Ambulation;RW (Recliner>bed 5 feet away)   Toileting- Clothing Manipulation and Hygiene: Total assistance (with mod A sit<>stand)         General ADL Comments: Pt reports that pta he had an aid that A with LBD and he did UBD and that he did his own sponge bath and got himself to the bathroom with the use of a RW.               Pertinent Vitals/Pain Pain Assessment: No/denies pain     Hand Dominance Right   Extremity/Trunk Assessment Upper Extremity Assessment Upper Extremity Assessment: LUE deficits/detail LUE Deficits / Details: Decreased strength and AROM consistent with residual effects of CVA; however able to use it functionally to a good extent     Communication Communication Communication: Expressive difficulties   Cognition  Arousal/Alertness: Awake/alert Behavior During Therapy: WFL for tasks assessed/performed Overall Cognitive Status: No family/caregiver present to determine baseline cognitive functioning                                Home Living Family/patient expects to be discharged to:: Skilled nursing facility Living Arrangements: Other relatives (Cousin)                                      Prior Functioning/Environment Level of Independence: Needs assistance             OT  Diagnosis: Generalized weakness;Hemiplegia non-dominant side   OT Problem List: Decreased strength;Decreased range of motion;Decreased activity tolerance;Impaired balance (sitting and/or standing);Impaired UE functional use;Impaired tone;Decreased knowledge of use of DME or AE      OT Goals(Current goals can be found in the care plan section) Acute Rehab OT Goals Patient Stated Goal: to rehab then back home  OT Frequency:                End of Session Equipment Utilized During Treatment: Gait belt;Rolling walker  Activity Tolerance: Patient limited by fatigue Patient left: in bed;with call bell/phone within reach;with bed alarm set   Time: 0093-8182 OT Time Calculation (min): 16 min Charges:  OT General Charges $OT Visit: 1 Procedure OT Evaluation $Initial OT Evaluation Tier I: 1 Procedure OT Treatments $Self Care/Home Management : 8-22 mins  Almon Register [phone] 07/16/2014, 5:03 PM

## 2014-07-16 NOTE — H&P (Signed)
Date: 07/16/2014               Patient Name:  [name] MRN: [id number]  DOB: April 06, 1951 Age / Sex: 63 y.o., male   PCP: Corky Sox, MD         Medical Service: Internal Medicine Teaching Service         Attending Physician: Dr. Bartholomew Crews, MD    First Contact: Dr. Dellia Nims Pager: [phone]  Second Contact: Dr. Michail Jewels Pager: [phone]       After Hours (After 5p/  First Contact Pager: [phone]  weekends / holidays): Second Contact Pager: [phone]   Chief Complaint: Generalized weakness  History of Present Illness: [name] is a 63yo man w/ PMHx of HTN, COPD, hx of multiple CVAs (2012, 2014, most recent on 12/22/13 with residual dysarthria and left-sided weakness), seizures, CKD Stage II, and history of metastatic prostate cancer (bone scan on 12/26/12 shows increased uptake at T6 vertebra) who presents to the ED with a 1 week history of generalized weakness. Patient reports he has had increased tiredness and weakness. He has been unable to eat for almost 1 week. He states he is too tired to get out of bed to make food for himself. He states he lives with his cousin, but they have been unable to care for him because they have been hospitalized. He reports a dry cough and weight loss, but denies fever, chills, changes in vision, chest pain, SOB, nausea, vomiting, abdominal pain, dysuria. He reports he has been taking his Keppra and Lisinopril, but not any of his other medications.   In the ED, a CT Head was performed which showed evidence of old cerebellar infarcts, but no acute intracranial abnormalities. CXR showed small nodular opacities in the lungs. CT Chest showed multiple small round sclerotic lesions in the ribs and thoracic spine concerning for metastatic disease.   Meds: No current facility-administered medications for this encounter.   Current Outpatient Prescriptions  Medication Sig Dispense Refill  . albuterol (PROVENTIL HFA;VENTOLIN HFA) 108 (90 BASE)  MCG/ACT inhaler Inhale 2 puffs into the lungs every 6 (six) hours as needed for wheezing. 8.5 g 3  . atorvastatin (LIPITOR) 80 MG tablet Take 80 mg by mouth at bedtime.    . chlorhexidine (PERIDEX) 0.12 % solution Use as directed 15 mLs in the mouth or throat daily.    . clopidogrel (PLAVIX) 75 MG tablet Take 1 tablet (75 mg total) by mouth daily with breakfast. 90 tablet 1  . diclofenac sodium (VOLTAREN) 1 % GEL Apply 1 application topically 3 (three) times daily as needed (knee pain).    . ferrous sulfate 325 (65 FE) MG tablet Take 1 tablet (325 mg total) by mouth 2 (two) times daily with a meal. 30 tablet 5  . folic acid (FOLVITE) 1 MG tablet Take 1 tablet (1 mg total) by mouth daily. 30 tablet 1  . hydrochlorothiazide (HYDRODIURIL) 25 MG tablet Take 25 mg by mouth daily.    Marland Kitchen levETIRAcetam (KEPPRA) 500 MG tablet Take 1,500 mg by mouth 2 (two) times daily.    Marland Kitchen lisinopril (PRINIVIL,ZESTRIL) 10 MG tablet Take 0.5 tablets (5 mg total) by mouth 2 (two) times daily. 30 tablet 1  . Multiple Vitamin (MULTIVITAMIN WITH MINERALS) TABS tablet Take 1 tablet by mouth daily.    . pantoprazole (PROTONIX) 40 MG tablet Take 1 tablet (40 mg total) by mouth daily. 90 tablet 1  . senna-docusate (SENOKOT-S) 8.6-50 MG per tablet  Take 1 tablet by mouth at bedtime as needed for mild constipation. 30 tablet 2  . tiotropium (SPIRIVA HANDIHALER) 18 MCG inhalation capsule Place 1 capsule (18 mcg total) into inhaler and inhale daily. 90 capsule 4  . zoster vaccine live, PF, (ZOSTAVAX) 24199 UNT/0.65ML injection Inject 19,400 Units into the skin once. 1 each 0    Allergies: Allergies as of 07/16/2014 - Review Complete 07/16/2014  Allergen Reaction Noted  . Aspirin Hives 10/18/2012  . Penicillins Hives 10/18/2012  . Other Other (See Comments) 12/12/2013   Past Medical History  Diagnosis Date  . Hypertension 05/18/2007  . COPD 05/18/2007  . Elevated PSA 05/25/2007  . Prostate cancer   . Seizures 12/05/2013;  12/08/2013; 12/11/2013    new onset; recurrent/notes 12/12/2013  . 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 Cerebral embolism with cerebral infarction     residual left sided weakness/notes 12/12/2013  . Protein-calorie malnutrition, severe     Hattie Perch 12/12/2013  . Chronic kidney disease (CKD), stage II (mild)     Hattie Perch 12/12/2013  . Colon polyps 10/20/2013    TUBULAR ADENOMA (4).   Past Surgical History  Procedure Laterality Date  . 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 Multiple extractions with alveoloplasty N/A 10/23/2013    Procedure: Extraction of tooth #'s 1,2,3,4,5,6,7,11,12,13,14,15,16,20,21,22,23,24,27,28,29,32 with alveoloplasty and bilateral maxillary tuberosity reductions;  Surgeon: Charlynne Pander, DDS;  Location: [facility] OR;  Service: Oral Surgery;  Laterality: N/A;  . Tibia fracture surgery Right     pin  . Tonsillectomy    . Colonoscopy w/ biopsies  2015    4 polyps, tubular adenomas, pan diverticulosis, one lipoma.   . Peg tube placement  12/2012    per IR  . Esophagogastroduodenoscopy N/A 06/06/2014    Procedure: ESOPHAGOGASTRODUODENOSCOPY (EGD);  Surgeon: Iva Boop, MD;  Location[facility];  Service: Endoscopy;  Laterality: N/A;   Family History  Problem Relation Age of Onset  . Liver disease Mother   . Heart attack Mother 61  . Colon cancer Neg Hx    History   Social History  . Marital Status: Single    Spouse Name: N/A    Number of Children: 0  . Years of Education: N/A   Occupational History  . Flooring instalation     Retired   Social History Main Topics  . Smoking status: Current Some Day Smoker -- 0.10 packs/day for 50 years    Types: Cigarettes  . Smokeless tobacco: Never Used     Comment: 1/2 ppd hx, now 3-4 cigs/day. Form given 05-14-14  . Alcohol Use: 0.0 oz/week    0 Cans of beer per week  . Drug Use: No     Comment: history of cocaine use  . Sexual Activity: Not Currently   Other Topics Concern  . Not on file    Social History Narrative   Used to drink 1/2 pt of wine daily and beer daily (at least 12 oz).  as of 06/03/14 denies recent ETOH.    Denies recreational drugs but tox screen + for cocaine in past.     Smokes 3-4 cigarettes. 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    2 years of college at [employer] per patient                 Review of Systems: General: See HPI HEENT: Denies headaches, ear pain, changes in  vision, rhinorrhea, sore throat CV: Denies palpitations, orthopnea Pulm: See HPI GI: Denies diarrhea, constipation, melena, hematochezia GU: Denies hematuria, frequency Msk: Denies muscle cramps, joint pains Neuro: Denies numbness, tingling Skin: Denies rashes, bruising  Physical Exam: Blood pressure 95/37, pulse 89, temperature 97.5 F (36.4 C), temperature source Oral, resp. rate 20, height 5' 8.5" (1.74 m), weight 63.504 kg (140 lb), SpO2 100 %. General: thin, slumped to side of bed, appears tired, NAD HEENT: [place]/AT, EOMI, sclera anicteric, mucus membranes dry CV: RRR, normal S1/S2, no m/g/r Pulm: CTA bilaterally, breaths non-labored Abd: BS+, soft, non-tender Ext: warm, no edema. Large non-tender subpatellar cystic-like mass (4 x 4 x 6 cm) present on left leg- chronic Neuro: alert and oriented x 3, smile symmetric, CNs II-XII intact, strength 4/5 in LUE and LLE, strength 5/5 in RUE and RLE  Lab results: Basic Metabolic Panel:  Recent Labs  07/16/14 0053  NA 143  K 4.1  CL 103  CO2 14*  GLUCOSE 88  BUN 99*  CREATININE 7.29*  CALCIUM 10.3   Liver Function Tests:  Recent Labs  07/16/14 0053  AST 13  ALT 14  ALKPHOS 70  BILITOT 0.2*  PROT 8.9*  ALBUMIN 3.8   No results for input(s): LIPASE, AMYLASE in the last 72 hours. No results for input(s): AMMONIA in the last 72 hours. CBC:  Recent Labs  07/16/14 0053  WBC 9.1  NEUTROABS 6.6  HGB 9.6*  HCT 30.7*  MCV 76.6*  PLT 302   Cardiac  Enzymes:  Recent Labs  07/16/14 0053  TROPONINI <0.30   BNP: No results for input(s): PROBNP in the last 72 hours. D-Dimer: No results for input(s): DDIMER in the last 72 hours. CBG: No results for input(s): GLUCAP in the last 72 hours. Hemoglobin A1C: No results for input(s): HGBA1C in the last 72 hours. Fasting Lipid Panel: No results for input(s): CHOL, HDL, LDLCALC, TRIG, CHOLHDL, LDLDIRECT in the last 72 hours. Thyroid Function Tests: No results for input(s): TSH, T4TOTAL, FREET4, T3FREE, THYROIDAB in the last 72 hours. Anemia Panel: No results for input(s): VITAMINB12, FOLATE, FERRITIN, TIBC, IRON, RETICCTPCT in the last 72 hours. Coagulation:  Recent Labs  07/16/14 0053  LABPROT 15.0  INR 1.17   Urine Drug Screen: Drugs of Abuse     Component Value Date/Time   LABOPIA NONE DETECTED 01/22/2014 1411   LABOPIA NEGATIVE 10/14/2010 2140   COCAINSCRNUR NONE DETECTED 01/22/2014 1411   COCAINSCRNUR * 10/14/2010 2140    POSITIVE (NOTE) Result repeated and verified. Sent for confirmatory testing   LABBENZ NONE DETECTED 01/22/2014 1411   LABBENZ NEGATIVE 10/14/2010 2140   AMPHETMU NONE DETECTED 01/22/2014 1411   AMPHETMU NEGATIVE 10/14/2010 2140   THCU NONE DETECTED 01/22/2014 1411   LABBARB NONE DETECTED 01/22/2014 1411    Alcohol Level:  Recent Labs  07/16/14 0053  ETH <11   Urinalysis: No results for input(s): COLORURINE, LABSPEC, PHURINE, GLUCOSEU, HGBUR, BILIRUBINUR, KETONESUR, PROTEINUR, UROBILINOGEN, NITRITE, LEUKOCYTESUR in the last 72 hours.  Invalid input(s): APPERANCEUR   Imaging results:  Ct Head Wo Contrast  07/16/2014   CLINICAL DATA:  Increase weakness to the left side for 3 days. Stroke about a month ago.  EXAM: CT HEAD WITHOUT CONTRAST  TECHNIQUE: Contiguous axial images were obtained from the base of the skull through the vertex without intravenous contrast.  COMPARISON:  MRI brain 06/05/2014.  CT head 12/02/2013.  FINDINGS: Diffuse cerebral  atrophy. Low-attenuation changes in the deep white matter consistent with small vessel ischemia.  Old right inferior cerebellar infarcts. No mass effect or midline shift. No abnormal extra-axial fluid collections. Gray-white matter junctions are distinct. Basal cisterns are not effaced. No evidence of acute intracranial hemorrhage. No depressed skull fractures. Opacification of the left maxillary antrum with mucosal thickening in the ethmoid air cells and sphenoid sinuses. Mastoid air cells are not opacified. Vascular calcifications.  IMPRESSION: No acute intracranial abnormalities. Chronic atrophy and small vessel ischemic changes. Old right inferior cerebellar infarcts. Opacification in the paranasal sinuses.   Electronically Signed   By: Lucienne Capers M.D.   On: 07/16/2014 01:24   Ct Chest Wo Contrast  07/16/2014   CLINICAL DATA:  Increasing weakness to the left side for 3 days. Stroke 1 month ago. Nodules on chest x-ray.  EXAM: CT CHEST WITHOUT CONTRAST  TECHNIQUE: Multidetector CT imaging of the chest was performed following the standard protocol without IV contrast.  COMPARISON:  Chest radiograph 07/16/2014 and 11/01/2013.  FINDINGS: Normal heart size. Dilated ascending thoracic aorta at 3.7 cm diameter. Calcification of coronary arteries and aorta. Esophagus is decompressed. No significant lymphadenopathy in the chest.  Diffuse emphysematous changes in the lungs. No significant pulmonary nodules are identified. Changes on chest radiograph likely represent artifact or bone lesions. No focal airspace disease or consolidation. No pneumothorax. No pleural effusions. Airways appear patent.  Multiple rounded sclerotic lesions demonstrated throughout the bony structures, including thoracic vertebrae and multiple bilateral ribs. These changes likely account for the nodular opacities demonstrated in the chest radiograph. Findings would be worrisome for bone metastases. Consider correlation with PSA for possible  prostate cancer metastases.  IMPRESSION: Emphysematous changes in the lungs. No focal pulmonary nodules. Multiple small round sclerotic lesions demonstrated in the ribs and thoracic spine, worrisome for metastatic prostate cancer. The pulmonary nodules seen on chest radiograph likely correspond to sclerotic rib lesions.   Electronically Signed   By: Lucienne Capers M.D.   On: 07/16/2014 02:39   Dg Chest Portable 1 View  07/16/2014   CLINICAL DATA:  Cough, weakness, nausea and vomiting.  EXAM: PORTABLE CHEST - 1 VIEW  COMPARISON:  11/01/2013  FINDINGS: Shallow inspiration. Normal heart size and pulmonary vascularity. Small nodular opacities are again demonstrated in both lungs. CT again suggested for further evaluation unless previously performed an outside institution in the interval. No focal airspace disease or consolidation. No blunting of costophrenic angles. No pneumothorax. Calcified and tortuous aorta.  IMPRESSION: Small nodular opacities again demonstrated in the lungs. CT again recommended for further evaluation. No evidence of active pulmonary disease.   Electronically Signed   By: Lucienne Capers M.D.   On: 07/16/2014 00:53    Other results: EKG: not available  Assessment & Plan by Problem: Principal Problem:   Acute-on-chronic kidney injury Active Problems:   Hypertension   Prostate cancer   Left-sided weakness, residual s/p cva   Seizures   Protein-calorie malnutrition, severe   Increased anion gap metabolic acidosis   Acute on chronic renal failure Mr. Waybright is a 63yo man w/ PMHx of HTN, COPD, hx of multiple CVAs (2012, 2014, most recent on 12/22/13 with residual dysarthria and left-sided weakness), seizures, CKD Stage II, and history of metastatic prostate cancer (bone scan on 12/26/12 shows increased uptake at T6 vertebra) who presents with a 1 week history of generalized weakness and anorexia found to have acute on chronic renal failure and more extensive mets from primary  prostate cancer.  Acute on chronic renal injury: Patient has underlying chronic renal disease stage II. Baseline creatinine  around 2. His most recent creatinine was 1.05 on 05/2014 but now 7.2 with a BUN of 99 up from 15. Bicarbonate of 14. Most likely cause of his acute renal injury is poor by mouth intake with dehydration . However, his BUN to creatinine ratio is 14:1 which goes against a prerenal etiology. He denies acute urinary symptoms. He denies a recent change in his medications. However, patient continued to take lisinopril which can worsen renal impairment. He is not very clear about HCTZ, which is on his medication list. Given his history of prostatic cancer, post obstructive nephropathy cannot be excluded. Plan -Continue with IV rehydration with lactated Ringer's at 1 25 cc/h with strict ins and outs. - A renal ultrasound scan - Urinalysis including urine chemistries (urine sodium and creatinine) as well to assist evaluate for the etiology or renal impairment. -BMP daily  -No acute need for dialysis currently, but may consider consultation with nephrology. -Hold HCTZ and lisinopril in the setting of AKI  Increased Anion Gap Metabolic Acidosis: In the setting of increased BUN of 99. Anion gap of 26. Previously normal anion gap. Plan - Will rehydrate and monitor with BMPs. Consider investigating for other etiologies if he does not improve as expected with hydration.  Protein energy malnutrition: Current weight is 140 pounds. He has lost about 25 pounds in the last 9 months. Albumin 3.8. Patient has a prior history of protein energy malnutrition with poor by mouth intake. Given likely metastatic prostate cancer, some of his weight loss could be due to underlying malignancy.  Plan -Nutritional consult. -Consider palliative care consult  History of prostate cancer: Apparently, patient has established history of prostate cancer and has been followed previously by Dr. Janice Norrie of [facility]. Chart review reveals that he received injections of Firmagon in June 2014, but it is unclear whether he follow up with urology more recently. In 2014 bone scan revealed uptake on T6 and an increased PSA of up to 236. Today's chest x-ray revealed small nodular opacities in the lungs and a follow-up chest CT without contrast revealed multiple small round sclerotic lesions in the ribs and thoracic spine, worrisome for metastatic prostate cancer. Patient most likely has metastatic cancer accounting for his recent weight loss. His current malignancy is likely contributing to his anorexia as well. Of note, would expect alk phos to be elevated in setting of bone mets. However, his alk phos was within normal range at 70.  Plan -Palliative care consult is appropriate. -Consider consulting with neurology as well.  Hypertension: His home regimen includes hydrochlorothiazide 25 mg daily and 5 mg of lisinopril twice a day. Currently, blood pressure is soft with a systolic of 42V. Plan -Hold antihypertensives. -Rehydrate. -Monitor blood pressure and consider starting medication if blood pressure is elevated  History of CVAs with left-sided weakness: Initially felt to have increased weakness in his left upper and lower extremities. Physical examination however, reveals his weakness to be at baseline with reduced power on the left side. He states that he uses a walker and cane. In the ED a cranial CT scan >> without acute intracranial abnormalities but noted old right inferior cerebellar infarcts. He has previously had extensive stroke workup. He takes aspirin and Plavix. Plan -Continue with aspirin and Plavix. -Continue with Lipitor 80 mg daily -Would only consider further workup for possible stroke if there new symptoms or signs. -May ask physical therapy and occupational therapy to evaluate  History of seizures: Could be related to his prior CVAs and  history of alcohol intake. He takes Keppra 1.5 g  twice daily. He has not had seizures recently and denies alcohol ingestion over the past several months. Plan  - Reduce Keppra dose to 500 mg twice a day due to renal injury  Diet: Renal with 1200 ml fluid restriction DVT/PE PPx: Heparin SQ Dispo: Disposition is deferred at this time, awaiting improvement of current medical problems. Anticipated discharge in approximately 1-2 day(s).   The patient does have a current PCP Corky Sox, MD) and does need an [facility] hospital follow-up appointment after discharge.  The patient does not have transportation limitations that hinder transportation to clinic appointments.  Signed: Albin Felling, MD 07/16/2014, 3:14 AM

## 2014-07-16 NOTE — ED Notes (Signed)
Pt arrives with c/o increased weakness to L side x3 days, hx of CVA about a month ago. Per EMS< weakness to L side is more significant than previously. CBG 133, decreased appetite as well. Denies pain

## 2014-07-16 NOTE — Plan of Care (Signed)
Problem: Phase I Progression Outcomes Goal: Pain controlled with appropriate interventions Outcome: Completed/Met Date Met:  07/16/14 Goal: OOB as tolerated unless otherwise ordered Outcome: Progressing Goal: Initial discharge plan identified Outcome: Completed/Met Date Met:  07/16/14 Goal: Hemodynamically stable Outcome: Completed/Met Date Met:  07/16/14 Goal: Other Phase I Outcomes/Goals Outcome: Not Applicable Date Met:  65/79/03

## 2014-07-16 NOTE — Progress Notes (Signed)
Subjective: Feeling weak. States he has been feeling generalized weakness for last 2-3 days. Lives with his cousin who is currently hospitalized. Has an aide coming at home 2 hours everyday. He states his cousin was helping him with managing his meds and since she is in the hospital, he has not been correctly taking all of his meds. He states he wasn't eating or drinking was noone was helping him prepare those. He does have appetite but wasn't taking anything because he wasn't able prepare food by himself.   States he was following with urology Dr. Janice Norrie of [facility] for his CA. He states his cancer was under control. But he did have bone scan in 2014 showing T6 lesion and increased PSA 236.  Denies any sob, cp, cough, n/v, fever, chills, headache, numbness, focal weakness, tingling.   Objective: Vital signs in last 24 hours: Filed Vitals:   07/16/14 0430 07/16/14 0500 07/16/14 0559 07/16/14 0644  BP: 104/65 107/62 102/65 105/64  Pulse: 87 80 92 86  Temp:    98.6 F (37 C)  TempSrc:    Oral  Resp: 17 14 20 18   Height:    5\' 8"  (1.727 m)  Weight:    131 lb 6.3 oz (59.6 kg)  SpO2: 100% 100% 96% 100%   Weight change:   Intake/Output Summary (Last 24 hours) at 07/16/14 1459 Last data filed at 07/16/14 1200  Gross per 24 hour  Intake 1388.33 ml  Output      0 ml  Net 1388.33 ml   Vitals reviewed. General: resting in bed, NAD, coughing some and spitting out white thick mucus HEENT: PERRL, EOMI, no scleral icterus Cardiac: RRR, no rubs, murmurs or gallops Pulm: clear to auscultation bilaterally, no wheezes, rales, or rhonchi. Low air movement. Coughing out white mucus.  Abd: soft, nontender, nondistended, BS present Ext: warm and well perfused, no pedal edema Neuro: alert and oriented X3, cranial nerves II-XII grossly intact, strength and sensation to light touch equal in bilateral upper and lower extremities  Lab Results: Basic Metabolic Panel:  Recent Labs Lab  07/16/14 0053 07/16/14 0850  NA 143 142  K 4.1 3.7  CL 103 106  CO2 14* 14*  GLUCOSE 88 129*  BUN 99* 102*  CREATININE 7.29* 6.51*  CALCIUM 10.3 9.5   Liver Function Tests:  Recent Labs Lab 07/16/14 0053  AST 13  ALT 14  ALKPHOS 70  BILITOT 0.2*  PROT 8.9*  ALBUMIN 3.8   No results for input(s): LIPASE, AMYLASE in the last 168 hours. No results for input(s): AMMONIA in the last 168 hours. CBC:  Recent Labs Lab 07/16/14 0053  WBC 9.1  NEUTROABS 6.6  HGB 9.6*  HCT 30.7*  MCV 76.6*  PLT 302   Cardiac Enzymes:  Recent Labs Lab 07/16/14 0053  TROPONINI <0.30   BNP: No results for input(s): PROBNP in the last 168 hours. D-Dimer: No results for input(s): DDIMER in the last 168 hours. CBG: No results for input(s): GLUCAP in the last 168 hours. Hemoglobin A1C: No results for input(s): HGBA1C in the last 168 hours. Fasting Lipid Panel: No results for input(s): CHOL, HDL, LDLCALC, TRIG, CHOLHDL, LDLDIRECT in the last 168 hours. Thyroid Function Tests: No results for input(s): TSH, T4TOTAL, FREET4, T3FREE, THYROIDAB in the last 168 hours. Coagulation:  Recent Labs Lab 07/16/14 0053  LABPROT 15.0  INR 1.17   Anemia Panel: No results for input(s): VITAMINB12, FOLATE, FERRITIN, TIBC, IRON, RETICCTPCT in the last 168 hours.  Urine Drug Screen: Drugs of Abuse     Component Value Date/Time   LABOPIA NONE DETECTED 01/22/2014 1411   LABOPIA NEGATIVE 10/14/2010 2140   COCAINSCRNUR NONE DETECTED 01/22/2014 1411   COCAINSCRNUR * 10/14/2010 2140    POSITIVE (NOTE) Result repeated and verified. Sent for confirmatory testing   LABBENZ NONE DETECTED 01/22/2014 1411   LABBENZ NEGATIVE 10/14/2010 2140   AMPHETMU NONE DETECTED 01/22/2014 1411   AMPHETMU NEGATIVE 10/14/2010 2140   THCU NONE DETECTED 01/22/2014 1411   LABBARB NONE DETECTED 01/22/2014 1411    Alcohol Level:  Recent Labs Lab 07/16/14 0053  ETH <11   Urinalysis: No results for input(s):  COLORURINE, LABSPEC, PHURINE, GLUCOSEU, HGBUR, BILIRUBINUR, KETONESUR, PROTEINUR, UROBILINOGEN, NITRITE, LEUKOCYTESUR in the last 168 hours.  Invalid input(s): APPERANCEUR Misc. Labs:  Micro Results: Recent Results (from the past 240 hour(s))  MRSA PCR Screening     Status: None   Collection Time: 07/16/14  8:50 AM  Result Value Ref Range Status   MRSA by PCR NEGATIVE NEGATIVE Final    Comment:        The GeneXpert MRSA Assay (FDA approved for NASAL specimens only), is one component of a comprehensive MRSA colonization surveillance program. It is not intended to diagnose MRSA infection nor to guide or monitor treatment for MRSA infections.    Studies/Results: Ct Head Wo Contrast  07/16/2014   CLINICAL DATA:  Increase weakness to the left side for 3 days. Stroke about a month ago.  EXAM: CT HEAD WITHOUT CONTRAST  TECHNIQUE: Contiguous axial images were obtained from the base of the skull through the vertex without intravenous contrast.  COMPARISON:  MRI brain 06/05/2014.  CT head 12/02/2013.  FINDINGS: Diffuse cerebral atrophy. Low-attenuation changes in the deep white matter consistent with small vessel ischemia. Old right inferior cerebellar infarcts. No mass effect or midline shift. No abnormal extra-axial fluid collections. Gray-white matter junctions are distinct. Basal cisterns are not effaced. No evidence of acute intracranial hemorrhage. No depressed skull fractures. Opacification of the left maxillary antrum with mucosal thickening in the ethmoid air cells and sphenoid sinuses. Mastoid air cells are not opacified. Vascular calcifications.  IMPRESSION: No acute intracranial abnormalities. Chronic atrophy and small vessel ischemic changes. Old right inferior cerebellar infarcts. Opacification in the paranasal sinuses.   Electronically Signed   By: Lucienne Capers M.D.   On: 07/16/2014 01:24   Ct Chest Wo Contrast  07/16/2014   CLINICAL DATA:  Increasing weakness to the left side  for 3 days. Stroke 1 month ago. Nodules on chest x-ray.  EXAM: CT CHEST WITHOUT CONTRAST  TECHNIQUE: Multidetector CT imaging of the chest was performed following the standard protocol without IV contrast.  COMPARISON:  Chest radiograph 07/16/2014 and 11/01/2013.  FINDINGS: Normal heart size. Dilated ascending thoracic aorta at 3.7 cm diameter. Calcification of coronary arteries and aorta. Esophagus is decompressed. No significant lymphadenopathy in the chest.  Diffuse emphysematous changes in the lungs. No significant pulmonary nodules are identified. Changes on chest radiograph likely represent artifact or bone lesions. No focal airspace disease or consolidation. No pneumothorax. No pleural effusions. Airways appear patent.  Multiple rounded sclerotic lesions demonstrated throughout the bony structures, including thoracic vertebrae and multiple bilateral ribs. These changes likely account for the nodular opacities demonstrated in the chest radiograph. Findings would be worrisome for bone metastases. Consider correlation with PSA for possible prostate cancer metastases.  IMPRESSION: Emphysematous changes in the lungs. No focal pulmonary nodules. Multiple small round sclerotic lesions demonstrated in the ribs  and thoracic spine, worrisome for metastatic prostate cancer. The pulmonary nodules seen on chest radiograph likely correspond to sclerotic rib lesions.   Electronically Signed   By: Lucienne Capers M.D.   On: 07/16/2014 02:39   US Renal  07/16/2014   CLINICAL DATA:  Acute renal insufficiency. Increased serum creatinine. History of stage II chronic renal disease.  EXAM: RENAL/URINARY TRACT ULTRASOUND COMPLETE  COMPARISON:  None.  FINDINGS: Right Kidney:  Length: 9.7 cm. Diffusely increased parenchymal echotexture consistent with chronic medical renal disease. No solid mass or hydronephrosis.  Left Kidney:  Length: 9.4 cm. Limited visualization due to overlying bowel gas. Diffusely increased parenchymal  echotexture consistent with chronic medical renal disease. No solid mass or hydronephrosis.  Bladder:  Bladder is decompressed and not visualized. Overlying bowel gas is present.  IMPRESSION: Both kidneys demonstrate increased parenchymal echotexture consistent with chronic medical renal disease. No hydronephrosis.   Electronically Signed   By: Lucienne Capers M.D.   On: 07/16/2014 05:39   Dg Chest Portable 1 View  07/16/2014   CLINICAL DATA:  Cough, weakness, nausea and vomiting.  EXAM: PORTABLE CHEST - 1 VIEW  COMPARISON:  11/01/2013  FINDINGS: Shallow inspiration. Normal heart size and pulmonary vascularity. Small nodular opacities are again demonstrated in both lungs. CT again suggested for further evaluation unless previously performed an outside institution in the interval. No focal airspace disease or consolidation. No blunting of costophrenic angles. No pneumothorax. Calcified and tortuous aorta.  IMPRESSION: Small nodular opacities again demonstrated in the lungs. CT again recommended for further evaluation. No evidence of active pulmonary disease.   Electronically Signed   By: Lucienne Capers M.D.   On: 07/16/2014 00:53   Medications: I have reviewed the patient's current medications. Scheduled Meds: . atorvastatin  80 mg Oral QHS  . clopidogrel  75 mg Oral Q breakfast  . feeding supplement (NEPRO CARB STEADY)  237 mL Oral BID BM  . feeding supplement (PRO-STAT SUGAR FREE 64)  30 mL Oral Q1500  . ferrous sulfate  325 mg Oral BID WC  . folic acid  1 mg Oral Daily  . heparin  5,000 Units Subcutaneous 3 times per day  . levETIRAcetam  1,500 mg Oral BID  . multivitamin with minerals  1 tablet Oral Daily  . pantoprazole  40 mg Oral Daily  . tiotropium  18 mcg Inhalation Daily   Continuous Infusions: . 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   Generalized weakness - likely 2/2 to not eating and drinking, and also from AKI as a result of not eating and drinking. Improving today with IVF and diet. - PT/OT consult  AKI on CKD - baseline crt varies from 1 to 2. Came in with 7.29. BUn/crt ratio doesn't fit pre-renal but his crt improved to 6.51 with IVF. This is likely pre-renal.  - renal u/s shows no blockage, just CKD from chronic disease. UA not done.  - will continue to give him IVF and expect kidney function to improve. Will not consult  nephrology at this time. He is also not a good candidate for dialysis because of his health issues.  Prostate cancer - has sclerotis lesions in the ribs and thoraic spine, likely 2/2 to prostate cancer mets. He had T6 lesion also visualized previously in 2014 with bone scan. Had PSA 236 at that time. Had firmagon injection in June 2014.  He also had ~30 lb weight loss recently.  - given his poor health status, consulted palliative care for [place] conversation. He may not be a good candidate for aggressive therapy for prostate cancer.  -f/up palliative care recs.  COPD - having some sputum production. No wheezing on exam - continue albuterol inhaler. Also spiriva.  - duoneb q4hr.   HTN - home regimen is hctz 25mg  daily + 5 mg lisinopril BID. Was only taking his lisinopril at home. - hold antihypertensives for now as BP remains borderline in low 100's.   Hx of CVA with residual left leg weakness - does not have any focal weakness other than his generalized weakness. No signs of new stroke. 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 Had  extensive stroke workup previously. Will not do any new workup now. - cont asa and plavix, cont lipitor 80 mg daily.   Hx of seizure - cont keppra. Was on 1500 mg kepra BID. No recent seizures - reduced keppra dose to 500mg  BID due to AKI.  Dispo: Disposition is deferred at this time, awaiting improvement of current medical problems.  Anticipated discharge in approximately 1-2 day(s).   The patient does have a current PCP Corky Sox, MD) and does need an [facility] hospital follow-up appointment after discharge.  The patient does have transportation limitations that hinder transportation to clinic appointments.    .Services Needed at time of discharge: Y = Yes, Blank = No PT:   OT:   RN:   Equipment:   Other:     LOS: 0 days   Dellia Nims, MD 07/16/2014, 2:59 PM

## 2014-07-16 NOTE — Evaluation (Signed)
Physical Therapy Evaluation Patient Details Name: [name] MRN: [id number] DOB: [birth year] Today's Date: 07/16/2014   History of Present Illness  Pt is a 63 y/o man w/ PMHx of HTN, COPD, hx of multiple CVAs (2012, 2014, most recent on 12/22/13 with residual dysarthria and left-sided weakness), seizures, CKD Stage II, and history of metastatic prostate cancer (bone scan on 12/26/12 shows increased uptake at T6 vertebra) who presents to the ED with a 1 week history of generalized weakness. Patient reports he has had increased tiredness and weakness. He has been unable to eat for almost 1 week. He states he is too tired to get out of bed to make food for himself. He states he lives with his cousin, but they have been unable to care for him because they have been hospitalized. He reports a dry cough and weight loss. CT of the chest showed multiple small round sclerotic lesions in the ribs and thoracic spine concerning for metastatic disease.     Clinical Impression  Pt admitted with the above. Pt currently with functional limitations due to the deficits listed below (see PT Problem List). At the time of PT eval pt was able to perform transfers with +2 assistance. Pt with increased difficulty managing standing activity. Appeared to be able to stabilize with BUE's, however L side with residual weakness from previous CVA. Pt will benefit from skilled PT to increase their independence and safety with mobility to allow discharge to the venue listed below.       Follow Up Recommendations SNF;Supervision/Assistance - 24 hour    Equipment Recommendations  Rolling walker with 5" wheels;3in1 (PT)    Recommendations for Other Services       Precautions / Restrictions Precautions Precautions: Fall Restrictions Weight Bearing Restrictions: No      Mobility  Bed Mobility Overal bed mobility: Needs Assistance Bed Mobility: Supine to Sit     Supine to sit: Min assist     General bed mobility  comments: Pt was able to transition to EOB with assist for LE movement and trunk elevation to full sitting position.   Transfers Overall transfer level: Needs assistance Equipment used: Rolling walker (2 wheeled) Transfers: Sit to/from [facility] Sit to Stand: Mod assist;+2 physical assistance Stand pivot transfers: +2 physical assistance;Mod assist       General transfer comment: Pt was able to power-up to stand with +2 assist. Steadying assist provided in standing before pt took pivotal steps around to the chair. Pt was able to take 2-3 small steps before needing to sit, and therapist scooted chair close urgently for pt to safely be seated.   Ambulation/Gait             General Gait Details: Pt unable to tolerate at this time.  Stairs            Wheelchair Mobility    Modified Rankin (Stroke Patients Only)       Balance Overall balance assessment: Needs assistance Sitting-balance support: Feet supported;No upper extremity supported Sitting balance-Leahy Scale: Fair     Standing balance support: Bilateral upper extremity supported;During functional activity Standing balance-Leahy Scale: Poor                               Pertinent Vitals/Pain Pain Assessment: No/denies pain    Home Living Family/patient expects to be discharged to:: Skilled nursing facility Living Arrangements: Other relatives (Cousin)  Prior Function Level of Independence: Needs assistance               Hand Dominance   Dominant Hand: Right    Extremity/Trunk Assessment   Upper Extremity Assessment: LUE deficits/detail       LUE Deficits / Details: Decreased strength and AROM consistent with residual effects of CVA   Lower Extremity Assessment: Generalized weakness;LLE deficits/detail   LLE Deficits / Details: Decreased strength and AROM consistent with residual weakness from CVA  Cervical / Trunk Assessment:  Normal  Communication   Communication: Expressive difficulties  Cognition Arousal/Alertness: Awake/alert Behavior During Therapy: WFL for tasks assessed/performed Overall Cognitive Status: No family/caregiver present to determine baseline cognitive functioning                      General Comments      Exercises        Assessment/Plan    PT Assessment Patient needs continued PT services  PT Diagnosis Difficulty walking;Generalized weakness;Abnormality of gait   PT Problem List Decreased strength;Decreased range of motion;Decreased activity tolerance;Decreased balance;Decreased mobility;Decreased knowledge of use of DME;Decreased safety awareness;Decreased knowledge of precautions  PT Treatment Interventions DME instruction;Gait training;Stair training;Functional mobility training;Therapeutic activities;Therapeutic exercise;Neuromuscular re-education;Patient/family education   PT Goals (Current goals can be found in the Care Plan section) Acute Rehab PT Goals Patient Stated Goal: Become more independent at SNF PT Goal Formulation: With patient Time For Goal Achievement: 07/30/14 Potential to Achieve Goals: Good    Frequency Min 2X/week   Barriers to discharge        Co-evaluation               End of Session Equipment Utilized During Treatment: Gait belt Activity Tolerance: Patient limited by fatigue Patient left: in chair;with call bell/phone within reach Nurse Communication: Mobility status         Time: 1431-1450 PT Time Calculation (min) (ACUTE ONLY): 19 min   Charges:   PT Evaluation $Initial PT Evaluation Tier I: 1 Procedure PT Treatments $Therapeutic Activity: 8-22 mins   PT G Codes:          Rolinda Roan 07/16/2014, 4:25 PM   Rolinda Roan, PT, DPT Acute Rehabilitation Services Pager: [phone]

## 2014-07-16 NOTE — ED Notes (Signed)
Transporting patient to new room assignment. 

## 2014-07-16 NOTE — Progress Notes (Signed)
New Admission Note  Arrival: 602 am Mental Orientation: AxO4 Telemetry: TX-20, NSR Assessment: See flow sheet. IV: RFA, SL. Safety Measures: Side rails in place, fall risk assessment complete with patient verbalizing understanding of risks associated with falls. Pt verbalizes an understanding of how to use the call bell and to call for help before getting out of bed. Call bell within reach, bed in lowest position, non-skid socks applied. [address] Orientation: Pt orientation to unit, room and routine. Information packet given to patient and safety video watched.  Admission armband ID verified with patient and in place.   Orders have been reviewed and implemented. Will continue to monitor and assist as needed.  Velora Mediate, RN 07/16/2014

## 2014-07-16 NOTE — Progress Notes (Signed)
INITIAL NUTRITION ASSESSMENT  Pt meets criteria for SEVERE MALNUTRITION in the context of chronic illness as evidenced by severe fat and muscle mass loss.  DOCUMENTATION CODES Per approved criteria  -Severe malnutrition in the context of chronic illness   INTERVENTION: Monitor magnesium, potassium, and phosphorus daily for at least 3 days, MD to replete as needed, as pt is at risk for refeeding syndrome given severe malnutrition and little to no PO intake for one week PTA.  Provide Nepro Shake po BID, each supplement provides 425 kcal and 19 grams protein.  Provide 30 ml Prostat po once daily, each supplement provides 100 kcal and 15 grams of protein.  Encourage adequate PO intake.  NUTRITION DIAGNOSIS: Increased nutrient needs related to chronic illness, cancer as evidenced by estimated nutrition needs.   Goal: Pt to meet >/= 90% of their estimated nutrition needs   Monitor:  PO intake, weight trends, labs, I/O's  Reason for Assessment: MD consult for poor po intake  63 y.o. 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 He has been unable to eat for almost 1 week.  Pt reports his appetite is currently "fine". Meal completion is 50%. Pt reports over the past week he has been eating little to nothing due to decreased appetite and weakness. Pt reports he is unable to recall his last full meal. Pt is at risk for refeeding syndrome. Pt reports prior to his decreased appetite and weakness he was eating fine with no difficulties. Pt reports weight loss. He reports weighing 144 lbs last month. Pt with ongoing weight loss. Noted pt with a  24.7% weight loss in 9 months. Pt is agreeable to oral supplements. Will order. Pt was encouraged to eat his food at meals and to drink his supplements.   Nutrition Focused Physical Exam:  Subcutaneous Fat:  Orbital Region: N/A Upper Arm Region: Severe depletion Thoracic and Lumbar Region: Severe depletion  Muscle:  Temple Region: Severe depletion Clavicle Bone Region: Severe depletion Clavicle and Acromion Bone Region: Severe depletion Scapular Bone Region: N/A Dorsal Hand: N/A Patellar Region: Severe depletion Anterior Thigh Region: Severe depletion Posterior Calf Region: Severe depletion   Edema: none  Labs: Low CO2, GFR. High BUN, creatinine.  Height: Ht Readings from Last 1 Encounters:  07/16/14 5\' 8"  (1.727 m)    Weight: Wt Readings from Last 1 Encounters:  07/16/14 131 lb 6.3 oz (59.6 kg)    Ideal Body Weight: 154 lbs  % Ideal Body Weight: 85%  Wt Readings from Last 10 Encounters:  07/16/14 131 lb 6.3 oz (59.6 kg)  06/28/14 138 lb 12.8 oz (62.959 kg)  06/03/14 140 lb 3.4 oz (63.6 kg)  05/14/14 143 lb 6.4 oz (65.046 kg)  04/10/14 145 lb 8 oz (65.998 kg)  01/23/14 163 lb 14.4 oz (74.345 kg)  01/21/14 150 lb 14.4 oz (68.448 kg)  01/11/14 149 lb (67.586 kg)  12/21/13 154 lb (69.854 kg)  12/14/13 167 lb 1.7 oz (75.8 kg)    Usual Body Weight: 174 lbs back in March 2015  % Usual Body Weight: 75%  BMI:  Body mass index is 19.98 kg/(m^2).  Estimated Nutritional Needs: Kcal: 1900-2200 Protein: 90-100  grams Fluid: 1.2 L/day  Skin: intact  Diet Order: DIET DYS 3 renal diet with 1200 ml fluids  EDUCATION NEEDS: -No education needs identified at this time   Intake/Output Summary (Last 24 hours) at 07/16/14 1434 Last data filed at 07/16/14 1200  Gross per 24 hour  Intake 1388.33 ml  Output      0 ml  Net 1388.33 ml    Last BM: PTA  Labs:   Recent Labs Lab 07/16/14 0053 07/16/14 0850  NA 143 142  K 4.1 3.7  CL 103 106  CO2 14* 14*  BUN  99* 102*  CREATININE 7.29* 6.51*  CALCIUM 10.3 9.5  GLUCOSE 88 129*    CBG (last 3)  No results for input(s): GLUCAP in the last 72 hours.  Scheduled Meds: . atorvastatin  80 mg Oral QHS  . clopidogrel  75 mg Oral Q breakfast  . ferrous sulfate  325 mg Oral BID WC  . folic acid  1 mg Oral Daily  . heparin  5,000 Units Subcutaneous 3 times per day  . levETIRAcetam  1,500 mg Oral BID  . multivitamin with minerals  1 tablet Oral Daily  . pantoprazole  40 mg Oral Daily  . tiotropium  18 mcg Inhalation Daily    Continuous Infusions: . sodium chloride 100 mL/hr at 07/16/14 1031    Past Medical History  Diagnosis Date  . Hypertension 05/18/2007  . COPD 05/18/2007  . Elevated PSA 05/25/2007  . Prostate cancer   . Seizures 12/05/2013; 12/08/2013; 12/11/2013    new onset; recurrent/notes 12/12/2013  . 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 Cerebral embolism with cerebral infarction     residual left sided weakness/notes 12/12/2013  . Protein-calorie malnutrition, severe     [name] 12/12/2013  . Chronic kidney disease (CKD), stage II (mild)     [name] 12/12/2013  . Colon polyps 10/20/2013    TUBULAR ADENOMA (4).    Past Surgical History  Procedure Laterality Date  . 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 Multiple extractions with alveoloplasty N/A 10/23/2013    Procedure: Extraction of tooth #'s 1,2,3,4,5,6,7,11,12,13,14,15,16,20,21,22,23,24,27,28,29,32 with alveoloplasty and bilateral maxillary tuberosity reductions;  Surgeon: Lenn Cal, DDS;  Location: [facility];  Service: Oral Surgery;  Laterality: N/A;  . Tibia fracture surgery Right     pin  . Tonsillectomy    . Colonoscopy w/ biopsies  2015    4 polyps, tubular adenomas, pan diverticulosis, one lipoma.   . Peg tube placement  12/2012    per IR  . Esophagogastroduodenoscopy N/A 06/06/2014    Procedure: ESOPHAGOGASTRODUODENOSCOPY (EGD);  Surgeon: Gatha Mayer, MD;  Location: [facility] ENDOSCOPY;  Service:  Endoscopy;  Laterality: N/A;    Kallie Locks, MS, RD, LDN Pager # [phone] After hours/ weekend pager # [phone]

## 2014-07-17 DIAGNOSIS — E43 Unspecified severe protein-calorie malnutrition: Secondary | ICD-10-CM

## 2014-07-17 DIAGNOSIS — Z515 Encounter for palliative care: Secondary | ICD-10-CM

## 2014-07-17 DIAGNOSIS — R630 Anorexia: Secondary | ICD-10-CM

## 2014-07-17 DIAGNOSIS — C61 Malignant neoplasm of prostate: Secondary | ICD-10-CM

## 2014-07-17 DIAGNOSIS — J449 Chronic obstructive pulmonary disease, unspecified: Secondary | ICD-10-CM

## 2014-07-17 LAB — BASIC METABOLIC PANEL
ANION GAP: 14 (ref 5–15)
BUN: 93 mg/dL — ABNORMAL HIGH (ref 6–23)
CHLORIDE: 114 meq/L — AB (ref 96–112)
CO2: 18 meq/L — AB (ref 19–32)
CREATININE: 3.84 mg/dL — AB (ref 0.50–1.35)
Calcium: 9.5 mg/dL (ref 8.4–10.5)
GFR calc Af Amer: 18 mL/min — ABNORMAL LOW (ref >90)
GFR calc non Af Amer: 15 mL/min — ABNORMAL LOW (ref >90)
GLUCOSE: 91 mg/dL (ref 70–99)
Potassium: 3.6 mEq/L — ABNORMAL LOW (ref 3.7–5.3)
Sodium: 146 mEq/L (ref 137–147)

## 2014-07-17 LAB — GLUCOSE, CAPILLARY: GLUCOSE-CAPILLARY: 104 mg/dL — AB (ref 70–99)

## 2014-07-17 MED ORDER — IPRATROPIUM-ALBUTEROL 0.5-2.5 (3) MG/3ML IN SOLN
3.0000 mL | Freq: Three times a day (TID) | RESPIRATORY_TRACT | Status: DC
  Administered 2014-07-17 – 2014-07-19 (×6): 3 mL via RESPIRATORY_TRACT
  Filled 2014-07-17 (×6): qty 3

## 2014-07-17 MED ORDER — LEVETIRACETAM 750 MG PO TABS
750.0000 mg | ORAL_TABLET | Freq: Two times a day (BID) | ORAL | Status: DC
  Administered 2014-07-17: 750 mg via ORAL
  Filled 2014-07-17 (×3): qty 1

## 2014-07-17 MED ORDER — DEGARELIX ACETATE 120 MG ~~LOC~~ SOLR
240.0000 mg | Freq: Once | SUBCUTANEOUS | Status: AC
  Administered 2014-07-18: 240 mg via SUBCUTANEOUS
  Filled 2014-07-17 (×2): qty 6

## 2014-07-17 MED ORDER — LEVETIRACETAM 500 MG PO TABS
500.0000 mg | ORAL_TABLET | Freq: Two times a day (BID) | ORAL | Status: DC
  Filled 2014-07-17: qty 1

## 2014-07-17 NOTE — Progress Notes (Addendum)
Subjective: Feeling better today. Feels stronger. Coughing and sputum production has decreased. Breathing better.  Denies any sob, n/v, fever, chills, headache, numbness, tingling.   His cousin Park Meo has medical POA for him.  Talked to urologist Dr. Janice Norrie. Patient last seen him in November 2014. Was supposed to go back on march but never went.   Objective: Vital signs in last 24 hours: Filed Vitals:   07/16/14 1513 07/16/14 1926 07/16/14 1949 07/17/14 0533  BP: 97/62  104/71 91/55  Pulse: 80  77 81  Temp: 97.4 F (36.3 C)  97.6 F (36.4 C) 97.9 F (36.6 C)  TempSrc: Oral     Resp:   18 16  Height:      Weight:   128 lb 15.5 oz (58.5 kg)   SpO2: 100% 100% 96% 98%   Weight change: -11 lb 0.5 oz (-5.003 kg)  Intake/Output Summary (Last 24 hours) at 07/17/14 0749 Last data filed at 07/17/14 0533  Gross per 24 hour  Intake 1301.66 ml  Output    752 ml  Net 549.66 ml   Vitals reviewed. General: resting in bed, NAD, coughing some and spitting out white thick mucus HEENT: PERRL, EOMI, no scleral icterus Cardiac: RRR, no rubs, murmurs or gallops Pulm: clear to auscultation bilaterally, no wheezes, rales, or rhonchi. Low air movement. Coughing out white mucus.  Abd: soft, nontender, nondistended, BS present.  Ext: warm and well perfused, no pedal edema.  Neuro: alert and oriented X3, cranial nerves II-XII grossly intact. strength 4/5 (baseline deficit from previous cva) on LLE, 5/5 everywhere else.  Lab Results: Basic Metabolic Panel:  Recent Labs Lab 07/16/14 0053 07/16/14 0850  NA 143 142  K 4.1 3.7  CL 103 106  CO2 14* 14*  GLUCOSE 88 129*  BUN 99* 102*  CREATININE 7.29* 6.51*  CALCIUM 10.3 9.5  PHOS  --  5.5*   Liver Function Tests:  Recent Labs Lab 07/16/14 0053  AST 13  ALT 14  ALKPHOS 70  BILITOT 0.2*  PROT 8.9*  ALBUMIN 3.8   No results for input(s): LIPASE, AMYLASE in the last 168 hours. No results for input(s): AMMONIA in the last 168  hours. CBC:  Recent Labs Lab 07/16/14 0053  WBC 9.1  NEUTROABS 6.6  HGB 9.6*  HCT 30.7*  MCV 76.6*  PLT 302   Cardiac Enzymes:  Recent Labs Lab 07/16/14 0053  TROPONINI <0.30   BNP: No results for input(s): PROBNP in the last 168 hours. D-Dimer: No results for input(s): DDIMER in the last 168 hours. CBG: No results for input(s): GLUCAP in the last 168 hours. Hemoglobin A1C: No results for input(s): HGBA1C in the last 168 hours. Fasting Lipid Panel: No results for input(s): CHOL, HDL, LDLCALC, TRIG, CHOLHDL, LDLDIRECT in the last 168 hours. Thyroid Function Tests: No results for input(s): TSH, T4TOTAL, FREET4, T3FREE, THYROIDAB in the last 168 hours. Coagulation:  Recent Labs Lab 07/16/14 0053  LABPROT 15.0  INR 1.17   Anemia Panel: No results for input(s): VITAMINB12, FOLATE, FERRITIN, TIBC, IRON, RETICCTPCT in the last 168 hours. Urine Drug Screen: Drugs of Abuse     Component Value Date/Time   LABOPIA NONE DETECTED 07/16/2014 1720   LABOPIA NEGATIVE 10/14/2010 2140   COCAINSCRNUR NONE DETECTED 07/16/2014 1720   COCAINSCRNUR * 10/14/2010 2140    POSITIVE (NOTE) Result repeated and verified. Sent for confirmatory testing   LABBENZ NONE DETECTED 07/16/2014 1720   LABBENZ NEGATIVE 10/14/2010 2140   AMPHETMU NONE DETECTED 07/16/2014  [place] 10/14/2010 2140   THCU NONE DETECTED 07/16/2014 1720   LABBARB NONE DETECTED 07/16/2014 1720    Alcohol Level:  Recent Labs Lab 07/16/14 0053  ETH <11   Urinalysis:  Recent Labs Lab 07/16/14 1720  COLORURINE YELLOW  LABSPEC 1.018  PHURINE 5.0  GLUCOSEU NEGATIVE  HGBUR NEGATIVE  BILIRUBINUR NEGATIVE  KETONESUR NEGATIVE  PROTEINUR NEGATIVE  UROBILINOGEN 0.2  NITRITE NEGATIVE  LEUKOCYTESUR NEGATIVE   Misc. Labs:  Micro Results: Recent Results (from the past 240 hour(s))  MRSA PCR Screening     Status: None   Collection Time: 07/16/14  8:50 AM  Result Value Ref Range Status   MRSA  by PCR NEGATIVE NEGATIVE Final    Comment:        The GeneXpert MRSA Assay (FDA approved for NASAL specimens only), is one component of a comprehensive MRSA colonization surveillance program. It is not intended to diagnose MRSA infection nor to guide or monitor treatment for MRSA infections.    Studies/Results: Ct Head Wo Contrast  07/16/2014   CLINICAL DATA:  Increase weakness to the left side for 3 days. Stroke about a month ago.  EXAM: CT HEAD WITHOUT CONTRAST  TECHNIQUE: Contiguous axial images were obtained from the base of the skull through the vertex without intravenous contrast.  COMPARISON:  MRI brain 06/05/2014.  CT head 12/02/2013.  FINDINGS: Diffuse cerebral atrophy. Low-attenuation changes in the deep white matter consistent with small vessel ischemia. Old right inferior cerebellar infarcts. No mass effect or midline shift. No abnormal extra-axial fluid collections. Gray-white matter junctions are distinct. Basal cisterns are not effaced. No evidence of acute intracranial hemorrhage. No depressed skull fractures. Opacification of the left maxillary antrum with mucosal thickening in the ethmoid air cells and sphenoid sinuses. Mastoid air cells are not opacified. Vascular calcifications.  IMPRESSION: No acute intracranial abnormalities. Chronic atrophy and small vessel ischemic changes. Old right inferior cerebellar infarcts. Opacification in the paranasal sinuses.   Electronically Signed   By: Lucienne Capers M.D.   On: 07/16/2014 01:24   Ct Chest Wo Contrast  07/16/2014   CLINICAL DATA:  Increasing weakness to the left side for 3 days. Stroke 1 month ago. Nodules on chest x-ray.  EXAM: CT CHEST WITHOUT CONTRAST  TECHNIQUE: Multidetector CT imaging of the chest was performed following the standard protocol without IV contrast.  COMPARISON:  Chest radiograph 07/16/2014 and 11/01/2013.  FINDINGS: Normal heart size. Dilated ascending thoracic aorta at 3.7 cm diameter. Calcification of  coronary arteries and aorta. Esophagus is decompressed. No significant lymphadenopathy in the chest.  Diffuse emphysematous changes in the lungs. No significant pulmonary nodules are identified. Changes on chest radiograph likely represent artifact or bone lesions. No focal airspace disease or consolidation. No pneumothorax. No pleural effusions. Airways appear patent.  Multiple rounded sclerotic lesions demonstrated throughout the bony structures, including thoracic vertebrae and multiple bilateral ribs. These changes likely account for the nodular opacities demonstrated in the chest radiograph. Findings would be worrisome for bone metastases. Consider correlation with PSA for possible prostate cancer metastases.  IMPRESSION: Emphysematous changes in the lungs. No focal pulmonary nodules. Multiple small round sclerotic lesions demonstrated in the ribs and thoracic spine, worrisome for metastatic prostate cancer. The pulmonary nodules seen on chest radiograph likely correspond to sclerotic rib lesions.   Electronically Signed   By: Lucienne Capers M.D.   On: 07/16/2014 02:39   US Renal  07/16/2014   CLINICAL DATA:  Acute renal insufficiency. Increased serum creatinine.  History of stage II chronic renal disease.  EXAM: RENAL/URINARY TRACT ULTRASOUND COMPLETE  COMPARISON:  None.  FINDINGS: Right Kidney:  Length: 9.7 cm. Diffusely increased parenchymal echotexture consistent with chronic medical renal disease. No solid mass or hydronephrosis.  Left Kidney:  Length: 9.4 cm. Limited visualization due to overlying bowel gas. Diffusely increased parenchymal echotexture consistent with chronic medical renal disease. No solid mass or hydronephrosis.  Bladder:  Bladder is decompressed and not visualized. Overlying bowel gas is present.  IMPRESSION: Both kidneys demonstrate increased parenchymal echotexture consistent with chronic medical renal disease. No hydronephrosis.   Electronically Signed   By: Lucienne Capers M.D.    On: 07/16/2014 05:39   Dg Chest Portable 1 View  07/16/2014   CLINICAL DATA:  Cough, weakness, nausea and vomiting.  EXAM: PORTABLE CHEST - 1 VIEW  COMPARISON:  11/01/2013  FINDINGS: Shallow inspiration. Normal heart size and pulmonary vascularity. Small nodular opacities are again demonstrated in both lungs. CT again suggested for further evaluation unless previously performed an outside institution in the interval. No focal airspace disease or consolidation. No blunting of costophrenic angles. No pneumothorax. Calcified and tortuous aorta.  IMPRESSION: Small nodular opacities again demonstrated in the lungs. CT again recommended for further evaluation. No evidence of active pulmonary disease.   Electronically Signed   By: Lucienne Capers M.D.   On: 07/16/2014 00:53   Medications: I have reviewed the patient's current medications. Scheduled Meds: . atorvastatin  80 mg Oral QHS  . clopidogrel  75 mg Oral Q breakfast  . feeding supplement (NEPRO CARB STEADY)  237 mL Oral BID BM  . feeding supplement (PRO-STAT SUGAR FREE 64)  30 mL Oral Q1500  . ferrous sulfate  325 mg Oral BID WC  . folic acid  1 mg Oral Daily  . heparin  5,000 Units Subcutaneous 3 times per day  . ipratropium-albuterol  3 mL Nebulization Q4H  . levETIRAcetam  1,500 mg Oral BID  . multivitamin with minerals  1 tablet Oral Daily  . pantoprazole  40 mg Oral Daily  . sodium bicarbonate  650 mg Oral BID  . tiotropium  18 mcg Inhalation Daily   Continuous Infusions: . 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   AKI on CKD - baseline crt varies from 1 to 2. Came in with 7.29. BUn/crt ratio doesn't fit pre-renal but his crt improved to 6.51 with IVF. renal u/s shows no blockage, just CKD from chronic disease. UA normal. Also his FENA is 0.5%. So, this is is likely pre-renal.  - will continue to give him IVF and expect kidney function to improve. Will not consult nephrology at this time. Initially suspected his crt won't improve but I am glad it is now with fluids.  Generalized weakness - likely 2/2 to not eating and drinking, and also from AKI as a result of not eating and drinking. Improving with IVF and diet.  - PT/OT consulted. Recommended SNF. sw consulted for placement.   Prostate cancer - has sclerotis lesions  in the ribs and thoraic spine, likely 2/2 to prostate cancer mets. He had T6 lesion also visualized previously in 2014 with bone scan. Had PSA 236 at that time. Had firmagon injection in June 2014. November 2014 got lupron injection.  He also had ~30 lb weight loss recently.  - given his poor health status, consulted palliative care for [place] conversation. -f/up palliative care recs. - also consulted urology for their opinion. Used to see Dr. Janice Norrie in the past.  - recommended checking PSA. He will also put in order for firmagon today.  COPD - having some sputum production. No wheezing on exam. Breathing and coughing improving. - continue albuterol inhaler. Also spiriva.  - duoneb q4hr.  HTN - home regimen is hctz 25mg  daily + 5 mg lisinopril BID. Was only taking his lisinopril at home. - hold antihypertensives for now as BP remains borderline in low 100's.   Hx of CVA with residual left leg weakness - does not have any focal weakness other than his generalized weakness. No signs of new stroke. 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 Had extensive stroke workup previously. Will not do any new workup now. - cont  asa and plavix, cont lipitor 80 mg daily.   Hx of seizure - cont keppra. Was on 1500 mg kepra BID. No recent seizures - reduced keppra dose to 500mg  BID due to AKI.  Dispo: Disposition is deferred at this time, awaiting improvement of current medical problems.  Anticipated discharge in approximately 1-2 day(s).   The patient does have a current PCP Corky Sox, MD) and does need an [facility] hospital follow-up appointment after discharge.  The patient does have transportation limitations that hinder transportation to clinic appointments.    .Services Needed at time of discharge: Y = Yes, Blank = No PT:   OT:   RN:   Equipment:   Other:     LOS: 1 day   Dellia Nims, MD 07/17/2014, 7:49 AM

## 2014-07-17 NOTE — Consult Note (Signed)
Patient [name]      DOB: Sep 17, 1950      KGU:[id number]     Consult Note from the Palliative Medicine Team at [facility] Requested by: Dr. Lynnae January     PCP: Luanne Bras, MD Reason for Consultation: Metastatic prostate cancer   Phone Number:[phone]  Assessment of patients Current state: I met today with [name]. He was very difficult to understand at times. We discussed that we are hopeful that his renal function continues to improve. We also spent much time discussing with poor appetite and weight loss and increasing weakness. He tells me he has lost >30 pounds over the past 1-2 years. He tells me that he has been only using the bed side commode over the past 4 months with very little energy or strength to do anything else. When we begin to discuss that his prostate cancer has likely spread to his ribs and this is not a good sign - he responds by pulling the blanket up over his head. He tells me that his cousin, [name], is his POA and helps him make medical decisions (he says POA is just for medical and not for financial matters). He said it was okay for me to call [facility]. I spoke with her and she confirms what he told me and that he was last checked by Dr. Janice Norrie ~2-3 months ago (I see no notes in EPIC). Melina Copa confirms that they would like to have follow up by urology to figure out the next steps. She also tells me that Mr. Coffield wants to be resuscitated but would not want long term life support. It seems that he would consider DNR depending on the situation with his cancer - they need more information on treatment options and recommendations. They both seem very reasonable. I will continue to follow.    Goals of Care: 1.  Code Status: FULL   2. Scope of Treatment: Continue all available and offered medical interventions.    4. Disposition: SNF rehab with palliative to follow.    3. Symptom Management:   1. Severe malnutrition: Nepro BID. Encourage  intake of meals and FLUIDS.   4. Psychosocial: Emotional support provided to patient and family.    Patient Documents Completed or Given: Document Given Completed  Advanced Directives Pkt    MOST    DNR    Gone from My Sight    Hard Choices      Brief HPI: 63 yo male admitted with generalized weakness r/t severe malnutrition and dehydration likely r/t metastatic prostate cancer. He has had very little energy and only using the bedside commode with very little other activity. PMH significant for prostate cancer, COPD, HTN, seizures, stroke, CKD stage II, and colon polyps.    ROS: Denies pain, anxiety, nausea, constipation. + appetite loss    PMH:  Past Medical History  Diagnosis Date  . Hypertension 05/18/2007  . COPD 05/18/2007  . Elevated PSA 05/25/2007  . Prostate cancer   . Seizures 12/05/2013; 12/08/2013; 12/11/2013    new onset; recurrent/notes 12/12/2013  . 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 Cerebral embolism with cerebral infarction     residual left sided weakness/notes 12/12/2013  . Protein-calorie malnutrition, severe     [name] 12/12/2013  . Chronic kidney disease (CKD), stage II (mild)     [name] 12/12/2013  . Colon polyps 10/20/2013    TUBULAR ADENOMA (4).     PSH: Past Surgical History  Procedure Laterality Date  . 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 Multiple extractions with alveoloplasty N/A 10/23/2013    Procedure: Extraction of tooth #'s 1,2,3,4,5,6,7,11,12,13,14,15,16,20,21,22,23,24,27,28,29,32 with alveoloplasty and bilateral maxillary tuberosity reductions;  Surgeon: Charlynne Pander, DDS;  Location: [facility] OR;  Service: Oral Surgery;  Laterality: N/A;  . Tibia fracture surgery Right     pin  . Tonsillectomy    . Colonoscopy w/ biopsies  2015    4 polyps, tubular adenomas, pan diverticulosis, one lipoma.   . Peg tube placement  12/2012    per IR  . Esophagogastroduodenoscopy N/A 06/06/2014    Procedure: ESOPHAGOGASTRODUODENOSCOPY  (EGD);  Surgeon: Iva Boop, MD;  Location: [facility] ENDOSCOPY;  Service: Endoscopy;  Laterality: N/A;   I have reviewed the FH and SH and  If appropriate update it with new information. Allergies  Allergen Reactions  . Aspirin Hives  . Penicillins Hives  . Other Other (See Comments)    Epidural caused seizure   Scheduled Meds: . atorvastatin  80 mg Oral QHS  . clopidogrel  75 mg Oral Q breakfast  . feeding supplement (NEPRO CARB STEADY)  237 mL Oral BID BM  . feeding supplement (PRO-STAT SUGAR FREE 64)  30 mL Oral Q1500  . ferrous sulfate  325 mg Oral BID WC  . folic acid  1 mg Oral Daily  . heparin  5,000 Units Subcutaneous 3 times per day  . ipratropium-albuterol  3 mL Nebulization TID  . levETIRAcetam  1,500 mg Oral BID  . multivitamin with minerals  1 tablet Oral Daily  . pantoprazole  40 mg Oral Daily  . sodium bicarbonate  650 mg Oral BID  . tiotropium  18 mcg Inhalation Daily   Continuous Infusions: . sodium chloride 75 mL/hr at 07/17/14 0920   PRN Meds:.albuterol    BP 106/58 mmHg  Pulse 87  Temp(Src) 98.1 F (36.7 C) (Oral)  Resp 16  Ht 5\' 8"  (1.727 m)  Wt 58.5 kg (128 lb 15.5 oz)  BMI 19.61 kg/m2  SpO2 93%   PPS: 30% at best   Intake/Output Summary (Last 24 hours) at 07/17/14 1141 Last data filed at 07/17/14 0915  Gross per 24 hour  Intake 1301.66 ml  Output    752 ml  Net 549.66 ml   LBM: 12/7  Physical Exam:  General: NAD, lying in bed, cachectic HEENT: Temporal muscle wasting, no JVD, moist mucous membranes Chest: CTA throughout, no labored breathing, symmetric CVS: RRR, S1 S2 Abdomen: Soft, NT, ND, +BS Ext: MAE, no edema, warm to touch Neuro: Awake, alert, oriented x 3, follows commands  Labs: CBC    Component Value Date/Time   WBC 9.1 07/16/2014 0053   RBC 4.01* 07/16/2014 0053   RBC 2.41* 12/12/2013 1456   HGB 9.6* 07/16/2014 0053   HCT 30.7* 07/16/2014 0053   PLT 302 07/16/2014 0053   MCV 76.6* 07/16/2014 0053   MCH 23.9*  07/16/2014 0053   MCHC 31.3 07/16/2014 0053   RDW 21.2* 07/16/2014 0053   LYMPHSABS 2.1 07/16/2014 0053   MONOABS 0.4 07/16/2014 0053   EOSABS 0.0 07/16/2014 0053   BASOSABS 0.0 07/16/2014 0053    BMET    Component Value Date/Time   NA 146 07/17/2014 0741   K 3.6* 07/17/2014 0741   CL 114* 07/17/2014 0741   CO2 18* 07/17/2014 0741   GLUCOSE 91 07/17/2014 0741   BUN 93* 07/17/2014 0741   CREATININE 3.84* 07/17/2014 0741   CREATININE 1.42*  12/01/2013 1137   CALCIUM 9.5 07/17/2014 0741   GFRNONAA 15* 07/17/2014 0741   GFRNONAA 52* 12/01/2013 1137   GFRAA 18* 07/17/2014 0741   GFRAA 60 12/01/2013 1137    CMP     Component Value Date/Time   NA 146 07/17/2014 0741   K 3.6* 07/17/2014 0741   CL 114* 07/17/2014 0741   CO2 18* 07/17/2014 0741   GLUCOSE 91 07/17/2014 0741   BUN 93* 07/17/2014 0741   CREATININE 3.84* 07/17/2014 0741   CREATININE 1.42* 12/01/2013 1137   CALCIUM 9.5 07/17/2014 0741   PROT 8.9* 07/16/2014 0053   ALBUMIN 3.8 07/16/2014 0053   AST 13 07/16/2014 0053   ALT 14 07/16/2014 0053   ALKPHOS 70 07/16/2014 0053   BILITOT 0.2* 07/16/2014 0053   GFRNONAA 15* 07/17/2014 0741   GFRNONAA 52* 12/01/2013 1137   GFRAA 18* 07/17/2014 0741   GFRAA 60 12/01/2013 1137    Time In Time Out Total Time Spent with Patient Total Overall Time  1020 1140 33mn 852m    Greater than 50%  of this time was spent counseling and coordinating care related to the above assessment and plan.  AlVinie SillNP Palliative Medicine Team Pager # [phone]-F 8a-5p) Team Phone # [phone]/Weekends)

## 2014-07-17 NOTE — Progress Notes (Signed)
Speech Language Pathology Treatment: Dysphagia  Patient Details Name: [name] MRN: [id number] DOB: [birth year] Today's Date: 07/17/2014 Time: [id number] SLP Time Calculation (min) (ACUTE ONLY): 15 min  Assessment / Plan / Recommendation Clinical Impression  SLP provided moderate verbal and visual cues (sign within pts visual field) to encourage carry over of recommended multiple swallow strategy to reduce oropharyngeal residuals. Pt was able to verbalize strategy with min question cue, but was not always compliant during intake. Recommend intermittent supervision by staff with continued reminders and brief f/u at SNF by SLP to reinforce strategies at next level of care. Despite inconsistency, pt is tolerating diet and will not benefit from continue acute intervention. Will sign off.    HPI HPI: 63 yo man w/ PMHx of HTN, COPD, hx of multiple CVAs (2012, 2014, most recent on 12/22/13 with residual dysarthria and left-sided weakness), seizures, CKD Stage II, and history of metastatic prostate cancer admitted with 1 week history of generalized weakness and inability to eat for almost 1 week due to lethargy (too tired to make food).  CT Head was performed which showed evidence of old cerebellar infarcts, but no acute intracranial abnormalities. CXR showed emphysematous changes in the lungs. No focal pulmonary nodules. Multiple small round sclerotic lesions demonstrated in the ribs and thoracic spine, worrisome for metastatic prostate cancersmall nodular opacities in the lungs. MBS 12/14/12 showed cervical esophageal impairments also observed; however, are suspected to be baseline due to observed boney prominences on cervical spine. Intermittent episodes of penetration from pharyngeal residue.  Dys 2, thin recommended with multiple and effortful swallow and hard cough, no straws.    Pertinent Vitals Pain Assessment: No/denies pain  SLP Plan  All goals met    Recommendations Diet recommendations:  Dysphagia 3 (mechanical soft);Thin liquid Liquids provided via: Cup;No straw Medication Administration: Whole meds with puree Supervision: Patient able to self feed;Full supervision/cueing for compensatory strategies Compensations: Slow rate;Small sips/bites;Multiple dry swallows after each bite/sip;Clear throat after each swallow Postural Changes and/or Swallow Maneuvers: Seated upright 90 degrees;Upright 30-60 min after meal              Oral Care Recommendations: Oral care BID Follow up Recommendations: Skilled Nursing facility Plan: All goals met    GO    Herbie Baltimore, MA CCC-SLP [phone]   Lynann Beaver 07/17/2014, 9:45 AM

## 2014-07-17 NOTE — Plan of Care (Signed)
Problem: Phase I Progression Outcomes Goal: OOB as tolerated unless otherwise ordered Outcome: Progressing     

## 2014-07-17 NOTE — Progress Notes (Signed)
  Date: 07/17/2014  Patient name: [name]  Medical record number: [id number]  Date of birth: 1951/07/29   This patient has been seen and the plan of care was discussed with the house staff. Please see their note for complete details. I concur with their findings with the following additions/corrections: Mr Hinderer looks and feels better today. Cr trending down 6.51 - 3.84 with IVF and stopping ACEI. Since he is making improvements overall  1. Contact urology to see if there are any prostate cancer tx available for him 2. Arrange SNF 3. Cont with palliative care consult for [place] is POA unknown if medical or legal 4. COnt to trend Cr  Possible transfer to SNF 10th. Pt in agreement.   Bartholomew Crews, MD 07/17/2014, 11:58 AM

## 2014-07-17 NOTE — Plan of Care (Signed)
Problem: SLP Dysphagia Goals Goal: Patient will utilize recommended strategies Patient will utilize recommended strategies during swallow to increase swallowing safety with  Outcome: Adequate for Discharge

## 2014-07-17 NOTE — Progress Notes (Signed)
Full note to follow:  I met today with [name]. He was very difficult to understand at times. We discussed that we are hopeful that his renal function continues to improve. We also spent much time discussing with poor appetite and weight loss and increasing weakness. He tells me he has lost >30 pounds over the past 1-2 years. He tells me that he has been only using the bed side commode over the past 4 months with very little energy or strength to do anything else. When we begin to discuss that his prostate cancer has likely spread to his ribs and this is not a good sign - he responds by pulling the blanket up over his head. He tells me that his cousin, [name], is his POA and helps him make medical decisions (he says POA is just for medical and not for financial matters). He said it was okay for me to call [facility]. I spoke with her and she confirms what he told me and that he was last checked by Dr. Janice Norrie ~2-3 months ago. Melina Copa confirms that they would like to have follow up by urology to figure out the next steps. She also tells me that [name] wants to be resuscitated but would not want long term life support. It seems that he would consider DNR depending on the situation with his cancer - they need more information on treatment options and recommendations. They both seem very reasonable. I will continue to follow.   Vinie Sill, NP Palliative Medicine Team Pager # [phone] (M-F 8a-5p) Team Phone # [phone] (Nights/Weekends)

## 2014-07-18 DIAGNOSIS — D509 Iron deficiency anemia, unspecified: Secondary | ICD-10-CM

## 2014-07-18 DIAGNOSIS — R531 Weakness: Secondary | ICD-10-CM | POA: Insufficient documentation

## 2014-07-18 LAB — PSA: PSA: 103.8 ng/mL — AB (ref <=4.00)

## 2014-07-18 LAB — BASIC METABOLIC PANEL
ANION GAP: 13 (ref 5–15)
BUN: 64 mg/dL — ABNORMAL HIGH (ref 6–23)
CHLORIDE: 115 meq/L — AB (ref 96–112)
CO2: 18 meq/L — AB (ref 19–32)
Calcium: 9.6 mg/dL (ref 8.4–10.5)
Creatinine, Ser: 1.85 mg/dL — ABNORMAL HIGH (ref 0.50–1.35)
GFR calc non Af Amer: 37 mL/min — ABNORMAL LOW (ref >90)
GFR, EST AFRICAN AMERICAN: 42 mL/min — AB (ref >90)
Glucose, Bld: 94 mg/dL (ref 70–99)
POTASSIUM: 3.6 meq/L — AB (ref 3.7–5.3)
Sodium: 146 mEq/L (ref 137–147)

## 2014-07-18 LAB — CBC
HCT: 23.4 % — ABNORMAL LOW (ref 39.0–52.0)
HEMOGLOBIN: 7.4 g/dL — AB (ref 13.0–17.0)
MCH: 23.8 pg — ABNORMAL LOW (ref 26.0–34.0)
MCHC: 31.6 g/dL (ref 30.0–36.0)
MCV: 75.2 fL — ABNORMAL LOW (ref 78.0–100.0)
Platelets: 239 10*3/uL (ref 150–400)
RBC: 3.11 MIL/uL — AB (ref 4.22–5.81)
RDW: 21.7 % — ABNORMAL HIGH (ref 11.5–15.5)
WBC: 7.2 10*3/uL (ref 4.0–10.5)

## 2014-07-18 LAB — IRON AND TIBC
IRON: 102 ug/dL (ref 42–135)
Saturation Ratios: 39 % (ref 20–55)
TIBC: 261 ug/dL (ref 215–435)
UIBC: 159 ug/dL (ref 125–400)

## 2014-07-18 LAB — RETICULOCYTES
RBC.: 3.2 MIL/uL — ABNORMAL LOW (ref 4.22–5.81)
RETIC CT PCT: 0.6 % (ref 0.4–3.1)
Retic Count, Absolute: 19.2 10*3/uL (ref 19.0–186.0)

## 2014-07-18 MED ORDER — LEVETIRACETAM 750 MG PO TABS
1500.0000 mg | ORAL_TABLET | Freq: Two times a day (BID) | ORAL | Status: DC
Start: 2014-07-18 — End: 2014-07-19
  Administered 2014-07-18 – 2014-07-19 (×3): 1500 mg via ORAL
  Filled 2014-07-18 (×4): qty 2

## 2014-07-18 MED ORDER — POTASSIUM CHLORIDE CRYS ER 20 MEQ PO TBCR
40.0000 meq | EXTENDED_RELEASE_TABLET | Freq: Every day | ORAL | Status: DC
  Administered 2014-07-18 – 2014-07-19 (×2): 40 meq via ORAL
  Filled 2014-07-18 (×2): qty 2

## 2014-07-18 NOTE — Consult Note (Signed)
Urology Consult  Referring physician: Dr Larey Dresser Reason for referral: Prostate cancer  History of Present Illness: [name] was diagnosed with prostate cancer Gleason 4+3 and 3+4 in April 2014.  PSA at diagnosis was 236.  Bone scan then showed increased uptake T6 suspicious for metastatic disease.  He was started on androgen deprivation and his PSA went down to 1.23 in February 2015.  [name] failed to show for his follow-up appointments.  He has not received Lupron since November 2014.  He states that he had to go to the hospital every time he had an appointment.  He has had several strokes since 2012.  His speech is slurred.  He is oriented to time, place and person.  He denies any bony pains.  He has a condom catheter and his urine is grossly clear.  He was admitted 2 days ago with increasing weakness left extremities.  CT scan chest showed multiple sclerotic lesions thoracic spine and bilateral ribs, probably metastatic bone disease.  His PSA is now 103.08. His creatinine on admission was 7.29.  It is down to 1.85.  Past Medical History  Diagnosis Date  . Hypertension 05/18/2007  . COPD 05/18/2007  . Elevated PSA 05/25/2007  . Prostate cancer   . Seizures 12/05/2013; 12/08/2013; 12/11/2013    new onset; recurrent/notes 12/12/2013  . 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 Cerebral embolism with cerebral infarction     residual left sided weakness/notes 12/12/2013  . Protein-calorie malnutrition, severe     [name] 12/12/2013  . Chronic kidney disease (CKD), stage II (mild)     [name] 12/12/2013  . Colon polyps 10/20/2013    TUBULAR ADENOMA (4).   Past Surgical History  Procedure Laterality Date  . 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 Multiple extractions with alveoloplasty N/A 10/23/2013    Procedure: Extraction of tooth #'s 1,2,3,4,5,6,7,11,12,13,14,15,16,20,21,22,23,24,27,28,29,32 with alveoloplasty and bilateral maxillary tuberosity reductions;  Surgeon: Lenn Cal, DDS;  Location: [place];  Service: Oral Surgery;  Laterality: N/A;  . Tibia fracture surgery Right     pin  . Tonsillectomy    . Colonoscopy w/ biopsies  2015    4 polyps, tubular adenomas, pan diverticulosis, one lipoma.   . Peg tube placement  12/2012    per IR  . Esophagogastroduodenoscopy N/A 06/06/2014    Procedure: ESOPHAGOGASTRODUODENOSCOPY (EGD);  Surgeon: Gatha Mayer, MD;  Location: [facility] ENDOSCOPY;  Service: Endoscopy;  Laterality: N/A;    Medications: Atorvastatin, albuterol, clopidogrel,ferrous sulfate, folic acid, protonix Allergies:  Allergies  Allergen Reactions  . Aspirin Hives  . Penicillins Hives  . Other Other (See Comments)    Epidural caused seizure    Family History  Problem Relation Age of Onset  . Liver disease Mother   . Heart attack Mother 67  . Colon cancer Neg Hx     Social History:  reports that he has been smoking Cigarettes.  He has a 5 pack-year smoking history. He has never used smokeless tobacco. He reports that he drinks alcohol. He reports that he does not use illicit drugs.  ROS  Physical Exam:  Vital signs in last 24 hours: Temp:  [97.5 F (36.4 C)-98.3 F (36.8 C)] 97.5 F (36.4 C) (12/09 0452) Pulse Rate:  [83-91] 83 (12/09 0452) Resp:  [16-17] 17 (12/09 0452) BP: (105-111)/(58-68) 105/63 mmHg (12/09 0452) SpO2:  [93 %-100 %] 100 % (12/09 0452) Weight:  [60 kg (132 lb 4.4 oz)] 60  kg (132 lb 4.4 oz) (12/08 1943) Physical Exam:  HEENT: Normal. Lungs: clear Heart: Regular rhythm Abdomen: Soft, non distended, non tender.  Bladder not distended.  No CVA tenderness. Genitalia: He has an external catheter. Scrotum is normal in appearance.  Testicles are normal.  No testicular mass. Rectal: Deferred  Laboratory Data:  Results for orders placed or performed during the hospital encounter of 07/16/14 (from the past 72 hour(s))  Ethanol     Status: None   Collection Time: 07/16/14 12:53 AM  Result Value Ref Range   Alcohol,  Ethyl (B) <11 0 - 11 mg/dL    Comment:        LOWEST DETECTABLE LIMIT FOR SERUM ALCOHOL IS 11 mg/dL FOR MEDICAL PURPOSES ONLY   Protime-INR     Status: None   Collection Time: 07/16/14 12:53 AM  Result Value Ref Range   Prothrombin Time 15.0 11.6 - 15.2 seconds   INR 1.17 0.00 - 1.49  APTT     Status: None   Collection Time: 07/16/14 12:53 AM  Result Value Ref Range   aPTT 26 24 - 37 seconds  CBC     Status: Abnormal   Collection Time: 07/16/14 12:53 AM  Result Value Ref Range   WBC 9.1 4.0 - 10.5 K/uL   RBC 4.01 (L) 4.22 - 5.81 MIL/uL   Hemoglobin 9.6 (L) 13.0 - 17.0 g/dL   HCT 30.7 (L) 39.0 - 52.0 %   MCV 76.6 (L) 78.0 - 100.0 fL   MCH 23.9 (L) 26.0 - 34.0 pg   MCHC 31.3 30.0 - 36.0 g/dL   RDW 21.2 (H) 11.5 - 15.5 %   Platelets 302 150 - 400 K/uL  Differential     Status: None   Collection Time: 07/16/14 12:53 AM  Result Value Ref Range   Neutrophils Relative % 73 43 - 77 %   Lymphocytes Relative 23 12 - 46 %   Monocytes Relative 4 3 - 12 %   Eosinophils Relative 0 0 - 5 %   Basophils Relative 0 0 - 1 %   Neutro Abs 6.6 1.7 - 7.7 K/uL   Lymphs Abs 2.1 0.7 - 4.0 K/uL   Monocytes Absolute 0.4 0.1 - 1.0 K/uL   Eosinophils Absolute 0.0 0.0 - 0.7 K/uL   Basophils Absolute 0.0 0.0 - 0.1 K/uL   RBC Morphology ELLIPTOCYTES     Comment: BURR CELLS ACANTHOCYTES TEARDROP CELLS    Smear Review PLATELETS APPEAR ADEQUATE     Comment: LARGE PLATELETS PRESENT  Comprehensive metabolic panel     Status: Abnormal   Collection Time: 07/16/14 12:53 AM  Result Value Ref Range   Sodium 143 137 - 147 mEq/L   Potassium 4.1 3.7 - 5.3 mEq/L   Chloride 103 96 - 112 mEq/L   CO2 14 (L) 19 - 32 mEq/L   Glucose, Bld 88 70 - 99 mg/dL   BUN 99 (H) 6 - 23 mg/dL   Creatinine, Ser 7.29 (H) 0.50 - 1.35 mg/dL   Calcium 10.3 8.4 - 10.5 mg/dL   Total Protein 8.9 (H) 6.0 - 8.3 g/dL   Albumin 3.8 3.5 - 5.2 g/dL   AST 13 0 - 37 U/L   ALT 14 0 - 53 U/L   Alkaline Phosphatase 70 39 - 117 U/L    Total Bilirubin 0.2 (L) 0.3 - 1.2 mg/dL   GFR calc non Af Amer 7 (L) >90 mL/min   GFR calc Af Amer 8 (L) >90 mL/min  Comment: (NOTE) The eGFR has been calculated using the CKD EPI equation. This calculation has not been validated in all clinical situations. eGFR's persistently <90 mL/min signify possible Chronic Kidney Disease.    Anion gap 26 (H) 5 - 15  Troponin I     Status: None   Collection Time: 07/16/14 12:53 AM  Result Value Ref Range   Troponin I <0.30 <0.30 ng/mL    Comment:        Due to the release kinetics of cTnI, a negative result within the first hours of the onset of symptoms does not rule out myocardial infarction with certainty. If myocardial infarction is still suspected, repeat the test at appropriate intervals.   Basic metabolic panel     Status: Abnormal   Collection Time: 07/16/14  8:50 AM  Result Value Ref Range   Sodium 142 137 - 147 mEq/L   Potassium 3.7 3.7 - 5.3 mEq/L   Chloride 106 96 - 112 mEq/L   CO2 14 (L) 19 - 32 mEq/L   Glucose, Bld 129 (H) 70 - 99 mg/dL   BUN 102 (H) 6 - 23 mg/dL   Creatinine, Ser 6.51 (H) 0.50 - 1.35 mg/dL   Calcium 9.5 8.4 - 10.5 mg/dL   GFR calc non Af Amer 8 (L) >90 mL/min   GFR calc Af Amer 9 (L) >90 mL/min    Comment: (NOTE) The eGFR has been calculated using the CKD EPI equation. This calculation has not been validated in all clinical situations. eGFR's persistently <90 mL/min signify possible Chronic Kidney Disease.    Anion gap 22 (H) 5 - 15  MRSA PCR Screening     Status: None   Collection Time: 07/16/14  8:50 AM  Result Value Ref Range   MRSA by PCR NEGATIVE NEGATIVE    Comment:        The GeneXpert MRSA Assay (FDA approved for NASAL specimens only), is one component of a comprehensive MRSA colonization surveillance program. It is not intended to diagnose MRSA infection nor to guide or monitor treatment for MRSA infections.   Phosphorus     Status: Abnormal   Collection Time: 07/16/14  8:50 AM   Result Value Ref Range   Phosphorus 5.5 (H) 2.3 - 4.6 mg/dL  Urine Drug Screen     Status: None   Collection Time: 07/16/14  5:20 PM  Result Value Ref Range   Opiates NONE DETECTED NONE DETECTED   Cocaine NONE DETECTED NONE DETECTED   Benzodiazepines NONE DETECTED NONE DETECTED   Amphetamines NONE DETECTED NONE DETECTED   Tetrahydrocannabinol NONE DETECTED NONE DETECTED   Barbiturates NONE DETECTED NONE DETECTED    Comment:        DRUG SCREEN FOR MEDICAL PURPOSES ONLY.  IF CONFIRMATION IS NEEDED FOR ANY PURPOSE, NOTIFY LAB WITHIN 5 DAYS.        LOWEST DETECTABLE LIMITS FOR URINE DRUG SCREEN Drug Class       Cutoff (ng/mL) Amphetamine      1000 Barbiturate      200 Benzodiazepine   850 Tricyclics       277 Opiates          300 Cocaine          300 THC              50   Urinalysis, Routine w reflex microscopic     Status: None   Collection Time: 07/16/14  5:20 PM  Result Value Ref Range   Color, Urine  YELLOW YELLOW   APPearance CLEAR CLEAR   Specific Gravity, Urine 1.018 1.005 - 1.030   pH 5.0 5.0 - 8.0   Glucose, UA NEGATIVE NEGATIVE mg/dL   Hgb urine dipstick NEGATIVE NEGATIVE   Bilirubin Urine NEGATIVE NEGATIVE   Ketones, ur NEGATIVE NEGATIVE mg/dL   Protein, ur NEGATIVE NEGATIVE mg/dL   Urobilinogen, UA 0.2 0.0 - 1.0 mg/dL   Nitrite NEGATIVE NEGATIVE   Leukocytes, UA NEGATIVE NEGATIVE    Comment: MICROSCOPIC NOT DONE ON URINES WITH NEGATIVE PROTEIN, BLOOD, LEUKOCYTES, NITRITE, OR GLUCOSE <1000 mg/dL.  Creatinine, urine, random     Status: None   Collection Time: 07/16/14  5:20 PM  Result Value Ref Range   Creatinine, Urine 289.98 mg/dL  Sodium, urine, random     Status: None   Collection Time: 07/16/14  5:20 PM  Result Value Ref Range   Sodium, Ur 31 mEq/L  Basic metabolic panel     Status: Abnormal   Collection Time: 07/17/14  7:41 AM  Result Value Ref Range   Sodium 146 137 - 147 mEq/L   Potassium 3.6 (L) 3.7 - 5.3 mEq/L   Chloride 114 (H) 96 - 112 mEq/L    CO2 18 (L) 19 - 32 mEq/L   Glucose, Bld 91 70 - 99 mg/dL   BUN 93 (H) 6 - 23 mg/dL   Creatinine, Ser 3.84 (H) 0.50 - 1.35 mg/dL   Calcium 9.5 8.4 - 10.5 mg/dL   GFR calc non Af Amer 15 (L) >90 mL/min   GFR calc Af Amer 18 (L) >90 mL/min    Comment: (NOTE) The eGFR has been calculated using the CKD EPI equation. This calculation has not been validated in all clinical situations. eGFR's persistently <90 mL/min signify possible Chronic Kidney Disease.    Anion gap 14 5 - 15  PSA     Status: Abnormal   Collection Time: 07/17/14  3:15 PM  Result Value Ref Range   PSA 103.80 (H) <=4.00 ng/mL    Comment: (NOTE) Result repeated and verified. Result confirmed by automatic dilution. Test Methodology: ECLIA PSA (Electrochemiluminescence Immunoassay) For PSA values from 2.5-4.0, particularly in younger men <17 years old, the AUA and NCCN suggest testing for % Free PSA (3515) and evaluation of the rate of increase in PSA (PSA velocity). Performed at [facility]   Glucose, capillary     Status: Abnormal   Collection Time: 07/17/14  7:42 PM  Result Value Ref Range   Glucose-Capillary 104 (H) 70 - 99 mg/dL  Basic metabolic panel     Status: Abnormal   Collection Time: 07/18/14  5:32 AM  Result Value Ref Range   Sodium 146 137 - 147 mEq/L   Potassium 3.6 (L) 3.7 - 5.3 mEq/L   Chloride 115 (H) 96 - 112 mEq/L   CO2 18 (L) 19 - 32 mEq/L   Glucose, Bld 94 70 - 99 mg/dL   BUN 64 (H) 6 - 23 mg/dL   Creatinine, Ser 1.85 (H) 0.50 - 1.35 mg/dL    Comment: DELTA CHECK NOTED   Calcium 9.6 8.4 - 10.5 mg/dL   GFR calc non Af Amer 37 (L) >90 mL/min   GFR calc Af Amer 42 (L) >90 mL/min    Comment: (NOTE) The eGFR has been calculated using the CKD EPI equation. This calculation has not been validated in all clinical situations. eGFR's persistently <90 mL/min signify possible Chronic Kidney Disease.    Anion gap 13 5 - 15  CBC  Status: Abnormal   Collection Time: 07/18/14  5:32 AM   Result Value Ref Range   WBC 7.2 4.0 - 10.5 K/uL   RBC 3.11 (L) 4.22 - 5.81 MIL/uL   Hemoglobin 7.4 (L) 13.0 - 17.0 g/dL   HCT 23.4 (L) 39.0 - 52.0 %   MCV 75.2 (L) 78.0 - 100.0 fL   MCH 23.8 (L) 26.0 - 34.0 pg   MCHC 31.6 30.0 - 36.0 g/dL   RDW 21.7 (H) 11.5 - 15.5 %   Platelets 239 150 - 400 K/uL   Recent Results (from the past 240 hour(s))  MRSA PCR Screening     Status: None   Collection Time: 07/16/14  8:50 AM  Result Value Ref Range Status   MRSA by PCR NEGATIVE NEGATIVE Final    Comment:        The GeneXpert MRSA Assay (FDA approved for NASAL specimens only), is one component of a comprehensive MRSA colonization surveillance program. It is not intended to diagnose MRSA infection nor to guide or monitor treatment for MRSA infections.    Creatinine:  Recent Labs  07/16/14 0053 07/16/14 0850 07/17/14 0741 07/18/14 0532  CREATININE 7.29* 6.51* 3.84* 1.85*     Impression/Assessment:  Metastatic prostate cancer Anemia Renal insufficiency S/P CVA  Plan:  Start degarelix 240 mgm S/Q. He will need follow-up in one month for continued androgen deprivation.  Arvil Persons 07/18/2014, 7:21 AM

## 2014-07-18 NOTE — Plan of Care (Signed)
Problem: Phase I Progression Outcomes Goal: OOB as tolerated unless otherwise ordered Outcome: Progressing     

## 2014-07-18 NOTE — Discharge Summary (Signed)
Name: [name] MRN: [id number] DOB: May 20, 1951 63 y.o. PCP: Corky Sox, MD  Date of Admission: 07/16/2014 12:07 AM Date of Discharge: 07/19/2014 Attending Physician: Bartholomew Crews, MD  Discharge Diagnosis:  Principal Problem:   Acute-on-chronic kidney injury Active Problems:   Hypertension   Prostate cancer   Left-sided weakness, residual s/p cva   Seizures   Protein-calorie malnutrition, severe   Increased anion gap metabolic acidosis   Acute on chronic renal failure   Palliative care encounter   Decreased appetite   Weakness  Discharge Medications:   Medication List    STOP taking these medications        hydrochlorothiazide 25 MG tablet  Commonly known as:  HYDRODIURIL     lisinopril 10 MG tablet  Commonly known as:  PRINIVIL,ZESTRIL     zoster vaccine live (PF) 19400 UNT/0.65ML injection  Commonly known as:  ZOSTAVAX      TAKE these medications        albuterol 108 (90 BASE) MCG/ACT inhaler  Commonly known as:  PROVENTIL HFA;VENTOLIN HFA  Inhale 2 puffs into the lungs every 6 (six) hours as needed for wheezing.     atorvastatin 80 MG tablet  Commonly known as:  LIPITOR  Take 80 mg by mouth at bedtime.     chlorhexidine 0.12 % solution  Commonly known as:  PERIDEX  Use as directed 15 mLs in the mouth or throat daily.     clopidogrel 75 MG tablet  Commonly known as:  PLAVIX  Take 1 tablet (75 mg total) by mouth daily with breakfast.     diclofenac sodium 1 % Gel  Commonly known as:  VOLTAREN  Apply 1 application topically 3 (three) times daily as needed (knee pain).     ferrous sulfate 325 (65 FE) MG tablet  Take 1 tablet (325 mg total) by mouth 2 (two) times daily with a meal.     folic acid 1 MG tablet  Commonly known as:  FOLVITE  Take 1 tablet (1 mg total) by mouth daily.     levETIRAcetam 500 MG tablet  Commonly known as:  KEPPRA  Take 1,500 mg by mouth 2 (two) times daily.     multivitamin with minerals Tabs tablet    Take 1 tablet by mouth daily.     pantoprazole 40 MG tablet  Commonly known as:  PROTONIX  Take 1 tablet (40 mg total) by mouth daily.     senna-docusate 8.6-50 MG per tablet  Commonly known as:  Senokot-S  Take 1 tablet by mouth at bedtime as needed for mild constipation.     tiotropium 18 MCG inhalation capsule  Commonly known as:  SPIRIVA HANDIHALER  Place 1 capsule (18 mcg total) into inhaler and inhale daily.        Disposition and follow-up:   [name] was discharged from [facility] in Stable condition.  At the hospital follow up visit please address:  1.  Please monitor his kidney function. He came in with AKI, resolved with IVF.   Please make sure he follows up with urology Dr. 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  Hey may require home RN. Consulted [facility] to check on him at home when he goes home.   Held anti-hypertensives because BP low and also in the setting of AKI. Restart if needed at follow up.  2.  Labs / imaging needed at time of follow-up: BMP, CBC.  3.  Pending labs/ test needing follow-up:   Follow-up Appointments: Follow-up Information    Follow up with Nesi, Charlene Brooke, MD In 1 month.   Specialty:  Urology   Why:  Schedule an appointment in about 1 month (08/24/2014)   Contact information:   [name] [id number] [phone]       Discharge Instructions: Discharge Instructions    Call MD for:  difficulty breathing, headache or visual disturbances    Complete by:  As directed      Call MD for:  extreme fatigue    Complete by:  As directed      Call MD for:  persistant dizziness or light-headedness    Complete by:  As directed      Call MD for:  persistant nausea and vomiting    Complete by:  As directed      Diet - low sodium heart healthy    Complete by:  As directed      Increase activity slowly    Complete by:  As directed            Consultations: Treatment Team:  Palliative  Joella Prince, MD  Procedures Performed:  Ct Head Wo Contrast  07/16/2014   CLINICAL DATA:  Increase weakness to the left side for 3 days. Stroke about a month ago.  EXAM: CT HEAD WITHOUT CONTRAST  TECHNIQUE: Contiguous axial images were obtained from the base of the skull through the vertex without intravenous contrast.  COMPARISON:  MRI brain 06/05/2014.  CT head 12/02/2013.  FINDINGS: Diffuse cerebral atrophy. Low-attenuation changes in the deep white matter consistent with small vessel ischemia. Old right inferior cerebellar infarcts. No mass effect or midline shift. No abnormal extra-axial fluid collections. Gray-white matter junctions are distinct. Basal cisterns are not effaced. No evidence of acute intracranial hemorrhage. No depressed skull fractures. Opacification of the left maxillary antrum with mucosal thickening in the ethmoid air cells and sphenoid sinuses. Mastoid air cells are not opacified. Vascular calcifications.  IMPRESSION: No acute intracranial abnormalities. Chronic atrophy and small vessel ischemic changes. Old right inferior cerebellar infarcts. Opacification in the paranasal sinuses.   Electronically Signed   By: Lucienne Capers M.D.   On: 07/16/2014 01:24   Ct Chest Wo Contrast  07/16/2014   CLINICAL DATA:  Increasing weakness to the left side for 3 days. Stroke 1 month ago. Nodules on chest x-ray.  EXAM: CT CHEST WITHOUT CONTRAST  TECHNIQUE: Multidetector CT imaging of the chest was performed following the standard protocol without IV contrast.  COMPARISON:  Chest radiograph 07/16/2014 and 11/01/2013.  FINDINGS: Normal heart size. Dilated ascending thoracic aorta at 3.7 cm diameter. Calcification of coronary arteries and aorta. Esophagus is decompressed. No significant lymphadenopathy in the chest.  Diffuse emphysematous changes in the lungs. No significant pulmonary nodules are identified. Changes on chest radiograph likely represent artifact or bone lesions. No focal  airspace disease or consolidation. No pneumothorax. No pleural effusions. Airways appear patent.  Multiple rounded sclerotic lesions demonstrated throughout the bony structures, including thoracic vertebrae and multiple bilateral ribs. These changes likely account for the nodular opacities demonstrated in the chest radiograph. Findings would be worrisome for bone metastases. Consider correlation with PSA for possible prostate cancer metastases.  IMPRESSION: Emphysematous changes in the lungs. No focal pulmonary nodules. Multiple small round sclerotic lesions demonstrated in the ribs and thoracic spine, worrisome for metastatic prostate cancer. The pulmonary nodules seen on chest radiograph likely correspond to sclerotic rib  lesions.   Electronically Signed   By: Lucienne Capers M.D.   On: 07/16/2014 02:39   US Renal  07/16/2014   CLINICAL DATA:  Acute renal insufficiency. Increased serum creatinine. History of stage II chronic renal disease.  EXAM: RENAL/URINARY TRACT ULTRASOUND COMPLETE  COMPARISON:  None.  FINDINGS: Right Kidney:  Length: 9.7 cm. Diffusely increased parenchymal echotexture consistent with chronic medical renal disease. No solid mass or hydronephrosis.  Left Kidney:  Length: 9.4 cm. Limited visualization due to overlying bowel gas. Diffusely increased parenchymal echotexture consistent with chronic medical renal disease. No solid mass or hydronephrosis.  Bladder:  Bladder is decompressed and not visualized. Overlying bowel gas is present.  IMPRESSION: Both kidneys demonstrate increased parenchymal echotexture consistent with chronic medical renal disease. No hydronephrosis.   Electronically Signed   By: Lucienne Capers M.D.   On: 07/16/2014 05:39   Dg Chest Portable 1 View  07/16/2014   CLINICAL DATA:  Cough, weakness, nausea and vomiting.  EXAM: PORTABLE CHEST - 1 VIEW  COMPARISON:  11/01/2013  FINDINGS: Shallow inspiration. Normal heart size and pulmonary vascularity. Small nodular  opacities are again demonstrated in both lungs. CT again suggested for further evaluation unless previously performed an outside institution in the interval. No focal airspace disease or consolidation. No blunting of costophrenic angles. No pneumothorax. Calcified and tortuous aorta.  IMPRESSION: Small nodular opacities again demonstrated in the lungs. CT again recommended for further evaluation. No evidence of active pulmonary disease.   Electronically Signed   By: Lucienne Capers M.D.   On: 07/16/2014 00:53   Admission HPI:   Mr. Damron is a 63yo man w/ PMHx of HTN, COPD, hx of multiple CVAs (2012, 2014, most recent on 12/22/13 with residual dysarthria and left-sided weakness), seizures, CKD Stage II, and history of metastatic prostate cancer (bone scan on 12/26/12 shows increased uptake at T6 vertebra) who presents to the ED with a 1 week history of generalized weakness. Patient reports he has had increased tiredness and weakness. He has been unable to eat for almost 1 week. He states he is too tired to get out of bed to make food for himself. He states he lives with his cousin, but they have been unable to care for him because they have been hospitalized. He reports a dry cough and weight loss, but denies fever, chills, changes in vision, chest pain, SOB, nausea, vomiting, abdominal pain, dysuria. He reports he has been taking his Keppra and Lisinopril, but not any of his other medications.   In the ED, a CT Head was performed which showed evidence of old cerebellar infarcts, but no acute intracranial abnormalities. CXR showed small nodular opacities in the lungs. CT Chest showed multiple small round sclerotic lesions in the ribs and thoracic spine concerning for metastatic disease.   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   AKI on CKD - baseline crt varies from 1 to 2. Came in with 7.29. BUn/crt ratio doesn't fit pre-renal but his crt has  improved with IVF to 6.51 > to 1.85 today.  Renal u/s shows no blockage, just CKD from chronic disease. UA normal. Also his FENA is 0.5%. So, this is is likely pre-renal.  - continued IVF as kidney function to improved, discontinued yesterday.  Tolerating PO. Is at risk of AKI again if doesn't take PO intake at home. Needs [facility] or home health RN to check on him regularly.  Initially suspected his crt won't improve but I am glad it is now with fluids. - needs BMP at follow up to make sure kidney function is stable.  Prostate cancer -Gleason 4+3 and 3+4 in April 2014 diagnosed. PSA at diagnosis 236. He had T6 lesion also visualized previously in 2014 with bone scan. Was started on androgen deprivation and PSA down to 1.02 Oct 2013 he didn't follow up at clinic with Dr. Janice Norrie from that piont. He didn't get lupron since November 2014. His PSA is going up again 103.8 here. 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 He also had ~30 lb weight loss recently.  - given his poor health status, consulted palliative care for [place] conversation. He remains full code for now.  - also consulted urology for their opinion. Used to see Dr. Janice Norrie in the past. Dr. Janice Norrie saw patient here. He put in order for firmagon x one dose.  Asked to follow up outpatient clinic in 1 month. Clinic is going to call him with appointment.  Microcytic anemia likely 2/2 to iron deficiency anemia. Has been known to have iron deficiency anemia in the past. Iron panel: Iron 102, UIBC 159,  TIBC 261, Ferritin 35. Hemoglobin is downtrending to 7.3. Sending out on ferrous sulfate by mouth 325 mg BID.  Generalized weakness - likely 2/2 to not eating and drinking, and also from AKI as a result of not eating and drinking. Improving with IVF and diet.  - PT/OT consulted. Recommended SN.  CSW consulted for placement.   COPD - having some sputum production. No wheezing on exam. Breathing and coughing improving. - continue albuterol inhaler. Also spiriva.  - duoneb q4hr.  HTN - home regimen is hctz 25mg  daily + 5 mg lisinopril BID. Was only taking his lisinopril at home. - hold ALL antihypertensives for now in the setting of AKI.  - will not start any antihypertensives currently. Restart if needed at follow up.  Hx of CVA with residual left leg weakness - does not have any focal weakness other than his generalized weakness. No signs of new stroke. 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 Had extensive stroke workup previously. Will not do any new workup now. - cont asa and plavix, cont lipitor 80 mg daily.   Hx of seizure - cont keppra. Was on 1500 mg kepra BID. No recent seizures - reduced keppra dose for AKI to 500 mg bid but now crt is better so put back on home dose.  Discharge Vitals:   BP 131/72 mmHg  Pulse 75  Temp(Src) 98.6 F (37 C) (Oral)  Resp 17  Ht 5\' 8"  (1.727 m)  Wt 135 lb 5.8 oz (61.4 kg)  BMI 20.59 kg/m2  SpO2 100%  Discharge Labs:  Results for orders placed or performed during the hospital encounter of 07/16/14 (from the past 24 hour(s))  CBC     Status: Abnormal   Collection Time: 07/19/14  4:00 AM  Result Value Ref Range   WBC 8.8 4.0 -  10.5 K/uL   RBC 3.01 (L) 4.22 - 5.81 MIL/uL   Hemoglobin 7.3 (L) 13.0 - 17.0 g/dL   HCT 22.7 (L) 39.0 - 52.0 %   MCV 75.4 (L) 78.0 - 100.0 fL   MCH 24.3 (L) 26.0 - 34.0 pg   MCHC 32.2 30.0 - 36.0 g/dL   RDW 21.9 (H) 11.5 - 15.5 %   Platelets 236 150 - 400 K/uL  Basic metabolic  panel     Status: Abnormal   Collection Time: 07/19/14  4:00 AM  Result Value Ref Range   Sodium 141 137 - 147 mEq/L   Potassium 3.6 (L) 3.7 - 5.3 mEq/L   Chloride 109 96 - 112 mEq/L   CO2 21 19 - 32 mEq/L   Glucose, Bld 90 70 - 99 mg/dL   BUN 38 (H) 6 - 23 mg/dL   Creatinine, Ser 1.22 0.50 - 1.35 mg/dL   Calcium 9.3 8.4 - 10.5 mg/dL   GFR calc non Af Amer 61 (L) >90 mL/min   GFR calc Af Amer 70 (L) >90 mL/min   Anion gap 11 5 - 15    Signed: Dellia Nims, MD 07/19/2014, 12:50 PM    Services Ordered on Discharge: Palliative care services Equipment Ordered on Discharge: none

## 2014-07-18 NOTE — Progress Notes (Signed)
  Date: 07/18/2014  Patient name: [name]  Medical record number: [id number]  Date of birth: August 18, 1950   This patient has been seen and the plan of care was discussed with the house staff. Please see their note for complete details. I concur with their findings with the following additions/corrections: He is feeling a bit stronger, his appetite is a bit better, cough gone.  Mr Richison Cr is now 1.85 - it has decreased more than I ever imagined it would. Dr Kellie Simmering has seen pt and restarted (he had missed several appts) anti-androgen tx. Mr Ricketson is in agreement with SNF. Appreciated Palliative consult. His BP remains good OFF ACE and HCTA. Will prefer not to resume unless pt > 150/90 as there is a great possibility that his appetite might decline again.  Ready for SNF once bed available.  Bartholomew Crews, MD 07/18/2014, 12:56 PM

## 2014-07-18 NOTE — Progress Notes (Signed)
I stopped by to visit with [name] today twice but he was asleep. Kidney function continues to improve and urology to see patient. Appears there are plans for SNF rehab soon. Recommend palliative to follow at SNF to follow functional status and nutritional status.  Terry Sill, NP Palliative Medicine Team Pager # [phone] (M-F 8a-5p) Team Phone # [phone] (Nights/Weekends)

## 2014-07-18 NOTE — Progress Notes (Signed)
Physical Therapy Treatment Patient Details Name: [name] MRN: [id number] DOB: November 03, 1950 Today's Date: 07/18/2014    History of Present Illness Pt is a 63 y/o man w/ PMHx of HTN, COPD, hx of multiple CVAs (2012, 2014, most recent on 12/22/13 with residual dysarthria and left-sided weakness), seizures, CKD Stage II, and history of metastatic prostate cancer (bone scan on 12/26/12 shows increased uptake at T6 vertebra) who presents to the ED with a 1 week history of generalized weakness. Patient reports he has had increased tiredness and weakness. He has been unable to eat for almost 1 week. He states he is too tired to get out of bed to make food for himself. He states he lives with his cousin, but they have been unable to care for him because they have been hospitalized. He reports a dry cough and weight loss. CT of the chest showed multiple small round sclerotic lesions in the ribs and thoracic spine concerning for metastatic disease.     PT Comments    Pt progressing towards physical therapy goals. Was able to progress with gait training today, and was motivated to walk out to the hallway. Chair follow required and pt took 1 seated rest break due to fatigue after ~10 feet. Pt declined chair at end of session, stating he has been up already this morning. Will continue to follow and progress as able per POC.   Follow Up Recommendations  SNF;Supervision/Assistance - 24 hour     Equipment Recommendations  Rolling walker with 5" wheels;3in1 (PT)    Recommendations for Other Services       Precautions / Restrictions Precautions Precautions: Fall Restrictions Weight Bearing Restrictions: No    Mobility  Bed Mobility Overal bed mobility: Needs Assistance Bed Mobility: Supine to Sit;Sit to Supine     Supine to sit: Min guard Sit to supine: Min guard   General bed mobility comments: Pt was able to transition to/from EOB with no physical assist. Pt asking for help to raise LE's up  into bed, however with contact to LE's pt was able to complete transfer. No real physical assistance required. Pt demonstrated the ability to scoot to [facility] with assist only to bring pad with him. VC's for sequencing/technique.  Transfers Overall transfer level: Needs assistance Equipment used: Rolling walker (2 wheeled) Transfers: Sit to/from Stand Sit to Stand: Min assist;+2 physical assistance Stand pivot transfers: Mod assist;+2 physical assistance       General transfer comment: Pt was able to power-up to full standing position with min assist +2. When pt does not demonstrate proper hand placement on seated surface increased assist is required. SPT recliner to bed required mod assist +2 for pt support, RW placement, and sequencing.   Ambulation/Gait Ambulation/Gait assistance: Mod assist;Min assist;+2 physical assistance;+2 safety/equipment Ambulation Distance (Feet): 15 Feet Assistive device: Rolling walker (2 wheeled) Gait Pattern/deviations: Step-to pattern;Decreased stride length;Decreased weight shift to left;Trunk flexed;Narrow base of support Gait velocity: Decreased Gait velocity interpretation: Below normal speed for age/gender General Gait Details: Pt was able to ambulate with RW, +2 assist and chair follow for safety. Pt required 1 seated rest break prior to end of gait training. Assist level varied from min-mod assist +2, however as pt fatigued assist was consistently mod +2. Frequent VC's for sequencing and technique.    Stairs            Wheelchair Mobility    Modified Rankin (Stroke Patients Only)       Balance Overall balance assessment: Needs  assistance Sitting-balance support: Feet supported;No upper extremity supported Sitting balance-Leahy Scale: Fair     Standing balance support: Bilateral upper extremity supported;During functional activity Standing balance-Leahy Scale: Poor                      Cognition Arousal/Alertness:  Awake/alert Behavior During Therapy: WFL for tasks assessed/performed Overall Cognitive Status: No family/caregiver present to determine baseline cognitive functioning                      Exercises      General Comments        Pertinent Vitals/Pain Pain Assessment: No/denies pain    Home Living                      Prior Function            PT Goals (current goals can now be found in the care plan section) Acute Rehab PT Goals Patient Stated Goal: to rehab then back home PT Goal Formulation: With patient Time For Goal Achievement: 07/30/14 Potential to Achieve Goals: Good Progress towards PT goals: Progressing toward goals    Frequency  Min 2X/week    PT Plan Current plan remains appropriate    Co-evaluation             End of Session Equipment Utilized During Treatment: Gait belt Activity Tolerance: Patient tolerated treatment well Patient left: in bed;with bed alarm set;with call bell/phone within reach     Time: 1101-1125 PT Time Calculation (min) (ACUTE ONLY): 24 min  Charges:  $Gait Training: 8-22 mins $Therapeutic Activity: 8-22 mins                    G Codes:      Rolinda Roan 07/27/14, 11:50 AM  Rolinda Roan, PT, DPT Acute Rehabilitation Services Pager: [phone]

## 2014-07-18 NOTE — Progress Notes (Signed)
Subjective: Feeling better today. Feels stronger. Coughing and sputum production has decreased. Breathing better.  Denies any sob, n/v, fever, chills, headache, numbness, tingling.   Palliate care saw the patient and is following. Code status remains full for now but would consider DNR depending on the situation with his cancer. Is getting firmagon today [facility] antagonist) for his PCA by urology Dr. Janice Norrie. crt improving significantly. SNf recommended by PT.   Objective: Vital signs in last 24 hours: Filed Vitals:   07/17/14 1710 07/17/14 1943 07/17/14 2029 07/18/14 0452  BP: 111/62 111/68  105/63  Pulse: 83 91  83  Temp: 98.1 F (36.7 C) 98.3 F (36.8 C)  97.5 F (36.4 C)  TempSrc: Oral     Resp: 17 16  17   Height:      Weight:  132 lb 4.4 oz (60 kg)    SpO2: 100% 100% 97% 100%   Weight change: 3 lb 4.9 oz (1.5 kg)  Intake/Output Summary (Last 24 hours) at 07/18/14 0935 Last data filed at 07/18/14 0453  Gross per 24 hour  Intake    480 ml  Output   1400 ml  Net   -920 ml   Vitals reviewed. General: resting in bed, NAD, coughing some and spitting out white thick mucus HEENT: PERRL, EOMI, no scleral icterus Cardiac: RRR, no rubs, murmurs or gallops Pulm: clear to auscultation bilaterally, no wheezes, rales, or rhonchi. Low air movement. Coughing out white mucus.  Abd: soft, nontender, nondistended, BS present.  Ext: warm and well perfused, no pedal edema.  Neuro: alert and oriented X3, cranial nerves II-XII grossly intact. strength 4/5 (baseline deficit from previous cva) on LLE, 5/5 everywhere else.  Lab Results: Basic Metabolic Panel:  Recent Labs Lab 07/16/14 0850 07/17/14 0741 07/18/14 0532  NA 142 146 146  K 3.7 3.6* 3.6*  CL 106 114* 115*  CO2 14* 18* 18*  GLUCOSE 129* 91 94  BUN 102* 93* 64*  CREATININE 6.51* 3.84* 1.85*  CALCIUM 9.5 9.5 9.6  PHOS 5.5*  --   --    Liver Function Tests:  Recent Labs Lab 07/16/14 0053  AST 13  ALT 14  ALKPHOS 70    BILITOT 0.2*  PROT 8.9*  ALBUMIN 3.8   No results for input(s): LIPASE, AMYLASE in the last 168 hours. No results for input(s): AMMONIA in the last 168 hours. CBC:  Recent Labs Lab 07/16/14 0053 07/18/14 0532  WBC 9.1 7.2  NEUTROABS 6.6  --   HGB 9.6* 7.4*  HCT 30.7* 23.4*  MCV 76.6* 75.2*  PLT 302 239   Cardiac Enzymes:  Recent Labs Lab 07/16/14 0053  TROPONINI <0.30   BNP: No results for input(s): PROBNP in the last 168 hours. D-Dimer: No results for input(s): DDIMER in the last 168 hours. CBG:  Recent Labs Lab 07/17/14 1942  GLUCAP 104*   Hemoglobin A1C: No results for input(s): HGBA1C in the last 168 hours. Fasting Lipid Panel: No results for input(s): CHOL, HDL, LDLCALC, TRIG, CHOLHDL, LDLDIRECT in the last 168 hours. Thyroid Function Tests: No results for input(s): TSH, T4TOTAL, FREET4, T3FREE, THYROIDAB in the last 168 hours. Coagulation:  Recent Labs Lab 07/16/14 0053  LABPROT 15.0  INR 1.17   Anemia Panel:  Recent Labs Lab 07/18/14 0835  RETICCTPCT 0.6   Urine Drug Screen: Drugs of Abuse     Component Value Date/Time   LABOPIA NONE DETECTED 07/16/2014 1720   LABOPIA NEGATIVE 10/14/2010 2140   COCAINSCRNUR NONE DETECTED 07/16/2014  [place] 10/14/2010 2140    POSITIVE (NOTE) Result repeated and verified. Sent for confirmatory testing   LABBENZ NONE DETECTED 07/16/2014 1720   LABBENZ NEGATIVE 10/14/2010 2140   AMPHETMU NONE DETECTED 07/16/2014 1720   AMPHETMU NEGATIVE 10/14/2010 2140   THCU NONE DETECTED 07/16/2014 1720   LABBARB NONE DETECTED 07/16/2014 1720    Alcohol Level:  Recent Labs Lab 07/16/14 0053  ETH <11   Urinalysis:  Recent Labs Lab 07/16/14 1720  COLORURINE YELLOW  LABSPEC 1.018  PHURINE 5.0  GLUCOSEU NEGATIVE  HGBUR NEGATIVE  BILIRUBINUR NEGATIVE  KETONESUR NEGATIVE  PROTEINUR NEGATIVE  UROBILINOGEN 0.2  NITRITE NEGATIVE  LEUKOCYTESUR NEGATIVE   Misc. Labs:  Micro Results: Recent  Results (from the past 240 hour(s))  MRSA PCR Screening     Status: None   Collection Time: 07/16/14  8:50 AM  Result Value Ref Range Status   MRSA by PCR NEGATIVE NEGATIVE Final    Comment:        The GeneXpert MRSA Assay (FDA approved for NASAL specimens only), is one component of a comprehensive MRSA colonization surveillance program. It is not intended to diagnose MRSA infection nor to guide or monitor treatment for MRSA infections.    Studies/Results: No results found. Medications: I have reviewed the patient's current medications. Scheduled Meds: . atorvastatin  80 mg Oral QHS  . clopidogrel  75 mg Oral Q breakfast  . degarelix  240 mg Subcutaneous Once  . feeding supplement (NEPRO CARB STEADY)  237 mL Oral BID BM  . feeding supplement (PRO-STAT SUGAR FREE 64)  30 mL Oral Q1500  . ferrous sulfate  325 mg Oral BID WC  . folic acid  1 mg Oral Daily  . heparin  5,000 Units Subcutaneous 3 times per day  . ipratropium-albuterol  3 mL Nebulization TID  . levETIRAcetam  750 mg Oral BID  . multivitamin with minerals  1 tablet Oral Daily  . pantoprazole  40 mg Oral Daily  . sodium bicarbonate  650 mg Oral BID  . tiotropium  18 mcg Inhalation Daily   Continuous Infusions: . 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   AKI on CKD - baseline crt varies from 1 to 2. Came in with 7.29. BUn/crt ratio doesn't fit pre-renal but his crt has improved with IVF to  6.51 > to 1.85 today. . renal u/s shows no blockage, just  CKD from chronic disease. UA normal. Also his FENA is 0.5%. So, this is is likely pre-renal.  - will continue to give him IVF and expect kidney function to improve. Initially suspected his crt won't improve but I am glad it is now with fluids.  Prostate cancer -Gleason 4+3 and 3+4 in April 2014 diagnosed. PSA at diagnosis 236.  He had T6 lesion also visualized previously in 2014 with bone scan. Was started on androgen deprivation and PSA down to 1.02 Oct 2013 he didn't follow up at clinic with Dr. Janice Norrie from that piont. He didn't get lupron since  November 2014. His PSA is going up again 103.8 here.  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  He also had ~30 lb weight loss recently.  - given his poor health status, consulted palliative care for [place] conversation. He remains full code for now.  - also consulted urology for their opinion. Used to see Dr. Janice Norrie in the past. Dr. Janice Norrie saw patient here. - recommended: checking PSA. He put in order for firmagon. Asked to follow up outpatient clinic. Clinic is going to call him with appointment.  Microcytic anemia likely 2/2 to iron deficiency anemia. Has hx of this in the past. Will check anemia panel again. - consider ferraheme to boost him up.  - cont ferrous sulfate.   Generalized weakness - likely 2/2 to not eating and drinking, and also from AKI as a result of not eating and drinking. Improving with IVF and diet.  - PT/OT consulted. Recommended SNF. sw consulted for placement.   COPD - having some sputum production. No wheezing on exam. Breathing and coughing improving. - continue albuterol inhaler. Also spiriva.  - duoneb q4hr.  HTN - home regimen is hctz 25mg  daily + 5 mg lisinopril BID. Was only taking his lisinopril at home. - hold antihypertensives for now in the setting of AKI. May restart HCTZ 2.5mg  at discharge.  Hx of CVA with residual left leg weakness - does not have any focal weakness  other than his generalized weakness. No signs of new stroke. 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 Had extensive stroke workup previously. Will not do any new workup now. - cont asa and plavix, cont lipitor 80 mg daily.   Hx of seizure - cont keppra. Was on 1500 mg kepra BID. No recent seizures - reduced keppra dose for AKI to 500 mg bid but now crt is better so put back on home dose.  Dispo: Disposition is deferred at this time, awaiting improvement of current medical problems.  Anticipated discharge in approximately 1-2 day(s).   The patient does have a current PCP Corky Sox, MD) and does need an [facility] hospital follow-up appointment after discharge.  The patient does have transportation limitations that hinder transportation to clinic appointments.    .Services Needed at time of discharge: Y = Yes, Blank = No PT:   OT:   RN:   Equipment:   Other:     LOS: 2 days   Dellia Nims, MD 07/18/2014, 9:35 AM

## 2014-07-18 NOTE — Clinical Social Work Note (Signed)
CSW received consult for SNF placement for patient. CSW assessed patient and he is in agreement with short-term rehab. Full assessment to follow.  [name], MSW, LCSW [phone]

## 2014-07-19 LAB — BASIC METABOLIC PANEL
ANION GAP: 11 (ref 5–15)
BUN: 38 mg/dL — ABNORMAL HIGH (ref 6–23)
CHLORIDE: 109 meq/L (ref 96–112)
CO2: 21 meq/L (ref 19–32)
CREATININE: 1.22 mg/dL (ref 0.50–1.35)
Calcium: 9.3 mg/dL (ref 8.4–10.5)
GFR calc Af Amer: 70 mL/min — ABNORMAL LOW (ref >90)
GFR calc non Af Amer: 61 mL/min — ABNORMAL LOW (ref >90)
Glucose, Bld: 90 mg/dL (ref 70–99)
Potassium: 3.6 mEq/L — ABNORMAL LOW (ref 3.7–5.3)
Sodium: 141 mEq/L (ref 137–147)

## 2014-07-19 LAB — CBC
HCT: 22.7 % — ABNORMAL LOW (ref 39.0–52.0)
Hemoglobin: 7.3 g/dL — ABNORMAL LOW (ref 13.0–17.0)
MCH: 24.3 pg — ABNORMAL LOW (ref 26.0–34.0)
MCHC: 32.2 g/dL (ref 30.0–36.0)
MCV: 75.4 fL — ABNORMAL LOW (ref 78.0–100.0)
Platelets: 236 10*3/uL (ref 150–400)
RBC: 3.01 MIL/uL — AB (ref 4.22–5.81)
RDW: 21.9 % — ABNORMAL HIGH (ref 11.5–15.5)
WBC: 8.8 10*3/uL (ref 4.0–10.5)

## 2014-07-19 LAB — PSA: PSA: 105.8 ng/mL — ABNORMAL HIGH (ref <=4.00)

## 2014-07-19 MED ORDER — IPRATROPIUM-ALBUTEROL 0.5-2.5 (3) MG/3ML IN SOLN: 3.0000 mL | Freq: Two times a day (BID) | RESPIRATORY_TRACT | Status: DC

## 2014-07-19 MED ORDER — SODIUM CHLORIDE 0.9 % IV SOLN: 1020.0000 mg | Freq: Once | INTRAVENOUS | Status: DC

## 2014-07-19 NOTE — Plan of Care (Signed)
Problem: Phase II Progression Outcomes Goal: Vital signs remain stable Outcome: Completed/Met Date Met:  07/19/14     

## 2014-07-19 NOTE — Clinical Social Work Placement (Addendum)
Clinical Social Work Department CLINICAL SOCIAL WORK PLACEMENT NOTE 07/19/2014  Patient:  [name], [name]  Account Number:  [id number] Admit date:  07/16/2014  Clinical Social Worker:  Karstyn Birkey Givens, LCSW  Date/time:  07/19/2014 11:25 AM  Clinical Social Work is seeking post-discharge placement for this patient at the following level of care:   [place]   (*CSW will update this form in Epic as items are completed)   07/18/2014  Patient/family provided with [facility] Department of Clinical Social Work's list of facilities offering this level of care within the geographic area requested by the patient (or if unable, by the patient's family).  07/18/2014  Patient/family informed of their freedom to choose among providers that offer the needed level of care, that participate in Medicare, Medicaid or managed care program needed by the patient, have an available bed and are willing to accept the patient.    Patient/family informed of [facility]' ownership interest in [facility], as well as of the fact that they are under no obligation to receive care at this facility.  PASARR submitted to EDS in 2014 PASARR number received in 2014   FL2 transmitted to all facilities in geographic area requested by pt/family on  07/18/2014 FL2 transmitted to all facilities within larger geographic area on   Patient informed that his/her managed care company has contracts with or will negotiate with  certain facilities, including the following:     Patient/family informed of bed offers received:  07/19/2014 Patient chooses bed at [facility] Physician recommends and patient chooses bed at    Patient to be transferred to [facility] on  07/19/2014 Patient to be transferred to facility by ambulance Patient and family notified of transfer on 07/19/14 Name of family member notified: Maryruth Bun, cousin - [phone].   The following physician  request were entered in Epic:   Additional Comments:

## 2014-07-19 NOTE — Plan of Care (Signed)
Problem: Phase I Progression Outcomes Goal: Voiding-avoid urinary catheter unless indicated Outcome: Completed/Met Date Met:  07/19/14  Problem: Phase II Progression Outcomes Goal: Discharge plan established Outcome: Completed/Met Date Met:  07/19/14 Goal: IV changed to normal saline lock Outcome: Completed/Met Date Met:  07/19/14 Goal: Obtain order to discontinue catheter if appropriate Outcome: Not Applicable Date Met:  56/81/27 Goal: Other Phase II Outcomes/Goals Outcome: Not Applicable Date Met:  51/70/01  Problem: Phase III Progression Outcomes Goal: Pain controlled on oral analgesia Outcome: Completed/Met Date Met:  07/19/14 Goal: Foley discontinued Outcome: Not Applicable Date Met:  74/94/49

## 2014-07-19 NOTE — Progress Notes (Signed)
  Date: 07/19/2014  Patient name: [name]  Medical record number: [id number]  Date of birth: 1951-01-03   This patient has been seen and the plan of care was discussed with the house staff. Please see their note for complete details. I concur with their findings with the following additions/corrections: Mr Sanders cont to look great and has proved me quite wrong in that his Cr returned to baseline - or almost to baseline. He is now stable for D/C to SNF OFF acei and HCTZ. F/U in [facility].  Bartholomew Crews, MD 07/19/2014, 1:35 PM

## 2014-07-19 NOTE — Care Management Note (Signed)
CARE MANAGEMENT NOTE 07/19/2014  Patient:  [name], [name]   Account Number:  [id number]  Date Initiated:  07/18/2014  Documentation initiated by:  Veva Grimley  Subjective/Objective Assessment:   CM following for progression and d/c planning.     Action/Plan:   Plan to d/c to SNF, CSW working with pt and family   Anticipated DC Date:  07/19/2014   Anticipated DC Plan:  SKILLED NURSING FACILITY         Choice offered to / List presented to:             Status of service:  Completed, signed off Medicare Important Message given?  YES (If response is "NO", the following Medicare IM given date fields will be blank) Date Medicare IM given:  07/19/2014 Medicare IM given by:  Aser Nylund Date Additional Medicare IM given:   Additional Medicare IM given by:    Discharge Disposition:  [place]  Per UR Regulation:    If discussed at Long Length of Stay Meetings, dates discussed:    Comments:  07/19/2014 Pt for d/c today to [facility], per pt choice. CRoyal RN MPH, case manager, [phone]

## 2014-07-19 NOTE — Progress Notes (Signed)
Report called to RN at [facility].   Joellen Jersey, RN.

## 2014-07-19 NOTE — Progress Notes (Signed)
Subjective:   Day of hospitalization: 3  VSS.  No overnight events.  Pt is feeling much better eating breakfast this AM asking for Ensure.  Denies pain.    Objective:   Vital signs in last 24 hours: Filed Vitals:   07/18/14 2048 07/19/14 0500 07/19/14 0800 07/19/14 0855  BP: 128/67 129/72  131/72  Pulse: 84 76  75  Temp: 98.4 F (36.9 C) 98.5 F (36.9 C)  98.6 F (37 C)  TempSrc: Oral Oral  Oral  Resp: 16 18  17   Height:      Weight: 135 lb 5.8 oz (61.4 kg)     SpO2: 100% 99% 98% 100%    Weight: Filed Weights   07/16/14 1949 07/17/14 1943 07/18/14 2048  Weight: 128 lb 15.5 oz (58.5 kg) 132 lb 4.4 oz (60 kg) 135 lb 5.8 oz (61.4 kg)    I/Os:  Intake/Output Summary (Last 24 hours) at 07/19/14 1239 Last data filed at 07/19/14 0856  Gross per 24 hour  Intake    360 ml  Output   2300 ml  Net  -1940 ml    Physical Exam: Constitutional: Vital signs reviewed.  Patient is lying in bed in no acute distress and cooperative with exam.   HEENT: /AT; PERRL, EOMI, no scleral icterus  Cardiovascular: RRR, no MRG Pulmonary/Chest: normal respiratory effort, no accessory muscle use, CTAB, no wheezes, rales, or rhonchi Abdominal: Soft. +BS, NT/ND Neurological: A&O x3, CN II-XII grossly intact; non-focal exam Extremities: 2+DP b/l, no C/C/E  Skin: Warm, dry and intact.   Lab Results:  BMP:  Recent Labs Lab 07/16/14 0850  07/18/14 0532 07/19/14 0400  NA 142  < > 146 141  K 3.7  < > 3.6* 3.6*  CL 106  < > 115* 109  CO2 14*  < > 18* 21  GLUCOSE 129*  < > 94 90  BUN 102*  < > 64* 38*  CREATININE 6.51*  < > 1.85* 1.22  CALCIUM 9.5  < > 9.6 9.3  PHOS 5.5*  --   --   --   < > = values in this interval not displayed.  CBC:  Recent Labs Lab 07/16/14 0053 07/18/14 0532 07/19/14 0400  WBC 9.1 7.2 8.8  NEUTROABS 6.6  --   --   HGB 9.6* 7.4* 7.3*  HCT 30.7* 23.4* 22.7*  MCV 76.6* 75.2* 75.4*  PLT 302 239 236    Coagulation:  Recent Labs Lab 07/16/14 0053    LABPROT 15.0  INR 1.17    CBG:            Recent Labs Lab 07/17/14 1942  GLUCAP 104*           HA1C:      No results for input(s): HGBA1C in the last 168 hours.  Lipid Panel: No results for input(s): CHOL, HDL, LDLCALC, TRIG, CHOLHDL, LDLDIRECT in the last 168 hours.  LFTs:  Recent Labs Lab 07/16/14 0053  AST 13  ALT 14  ALKPHOS 70  BILITOT 0.2*  PROT 8.9*  ALBUMIN 3.8    Pancreatic Enzymes: No results for input(s): LIPASE, AMYLASE in the last 168 hours.  Lactic Acid/Procalcitonin: No results for input(s): LATICACIDVEN, PROCALCITON in the last 168 hours.  Ammonia: No results for input(s): AMMONIA in the last 168 hours.  Cardiac Enzymes:  Recent Labs Lab 07/16/14 0053  TROPONINI <0.30    EKG: EKG Interpretation  Date/Time:  Monday July 16 2014 00:24:18 EST Ventricular Rate:  83 PR Interval:  175 QRS Duration: 124 QT Interval:  484 QTC Calculation: 569 R Axis:   -41 Text Interpretation:  Sinus rhythm Nonspecific IVCD with LAD Borderline ST elevation, anterior leads ED PHYSICIAN INTERPRETATION AVAILABLE IN [facility] Confirmed by TEST, Record ([id number]) on 07/18/2014 7:54:06 AM   BNP: No results for input(s): PROBNP in the last 168 hours.  D-Dimer: No results for input(s): DDIMER in the last 168 hours.  Urinalysis:  Recent Labs Lab 07/16/14 1720  COLORURINE YELLOW  LABSPEC 1.018  PHURINE 5.0  GLUCOSEU NEGATIVE  HGBUR NEGATIVE  BILIRUBINUR NEGATIVE  KETONESUR NEGATIVE  PROTEINUR NEGATIVE  UROBILINOGEN 0.2  NITRITE NEGATIVE  LEUKOCYTESUR NEGATIVE    Micro Results: Recent Results (from the past 240 hour(s))  MRSA PCR Screening     Status: None   Collection Time: 07/16/14  8:50 AM  Result Value Ref Range Status   MRSA by PCR NEGATIVE NEGATIVE Final    Comment:        The GeneXpert MRSA Assay (FDA approved for NASAL specimens only), is one component of a comprehensive MRSA colonization surveillance program. It is  not intended to diagnose MRSA infection nor to guide or monitor treatment for MRSA infections.     Blood Culture:    Component Value Date/Time   SDES URINE, CLEAN CATCH 01/24/2014 0420   SPECREQUEST NONE 01/24/2014 0420   CULT  01/24/2014 0420    Multiple bacterial morphotypes present, none predominant. Suggest appropriate recollection if clinically indicated. Performed at [facility] 01/25/2014 FINAL 01/24/2014 0420    Studies/Results: No results found.  Medications:  Scheduled Meds: . atorvastatin  80 mg Oral QHS  . clopidogrel  75 mg Oral Q breakfast  . feeding supplement (NEPRO CARB STEADY)  237 mL Oral BID BM  . feeding supplement (PRO-STAT SUGAR FREE 64)  30 mL Oral Q1500  . ferrous sulfate  325 mg Oral BID WC  . folic acid  1 mg Oral Daily  . heparin  5,000 Units Subcutaneous 3 times per day  . ipratropium-albuterol  3 mL Nebulization TID  . levETIRAcetam  1,500 mg Oral BID  . multivitamin with minerals  1 tablet Oral Daily  . pantoprazole  40 mg Oral Daily  . potassium chloride  40 mEq Oral Daily  . tiotropium  18 mcg Inhalation Daily   Continuous Infusions:  PRN Meds: albuterol  Antibiotics: Antibiotics Given (last 72 hours)    None      Day of Hospitalization: 3  Consults: Treatment Team:  Palliative Joella Prince, MD  Assessment/Plan:   Principal Problem:   Acute-on-chronic kidney injury Active Problems:   Hypertension   Prostate cancer   Left-sided weakness, residual s/p cva   Seizures   Protein-calorie malnutrition, severe   Increased anion gap metabolic acidosis   Acute on chronic renal failure   Palliative care encounter   Decreased appetite   Weakness  AKI on CKD Resolved-->back to baseline.  Baseline ~1-2.  Came in with 7.29. BUn/crt ratio doesn't fit pre-renal but his crt has improved with IVF to 6.51 > to 1.22 today.  Renal US shows obstruction, just CKD from chronic disease. UA normal. Also his  FENA is 0.5%.  Likely pre-renal.  -d/c IVF overnight -monitor at SNF  Prostate cancer -Gleason 4+3 and 3+4 in April 2014 diagnosed. PSA at diagnosis 236. He had T6 lesion also visualized previously in 2014 with bone scan. Was started on androgen deprivation and PSA down to  1.02 Oct 2013 he didn't follow up at clinic with Dr. Janice Norrie from that piont. He didn't get lupron since November 2014. His PSA is going up again 103.8 here.  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  He also had ~30 lb weight loss recently.  - given his poor health status, consulted palliative care for [place] conversation. He remains full code for now.  Will follow along at SNF.   - also consulted urology for their opinion. Used to see Dr. Janice Norrie in the past. Dr. Janice Norrie saw patient here and recommended: checking PSA. Given one dose of irmagon. Asked to follow up outpatient clinic. Clinic is going to call him with appointment.  Microcytic anemia likely 2/2 to iron deficiency anemia. Has hx of this in the past. Anemia panel shows ferritin of 35.  - cont ferrous sulfate.   Generalized weakness Likely 2/2 to decreased po intake.  Improved with IVF and diet.  - PT/OT consulted. Recommended SNF  CSW consulted for placement.   COPD No wheezing on exam. Breathing and coughing improving. - continue albuterol inhaler. Also spiriva.  - duoneb q4hr.  HTN home regimen is hctz 25mg  daily + 5 mg lisinopril BID. Was only taking his lisinopril at home. - hold antihypertensives for now in the setting of AKI.  - would d/c ALL antihypertensives as patient has decreased po intake which likely contributed to his AKI   Hx of CVA with residual left leg weakness No focal weakness other than his generalized weakness. No signs of new stroke. 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 Had extensive stroke workup previously. Will not do any new workup now. - cont asa and  plavix, cont lipitor 80 mg daily.   Hx of seizure - cont keppra. Was on 1500 mg kepra BID. No recent seizures - reduced keppra dose for AKI to 500 mg bid but now SCr is better so put back on home dose.  Disposition Discharge SNF today.  LOS: 3 days   Jones Bales, MD PGY-2, Internal Medicine Teaching Service 07/19/2014, 12:39 PM

## 2014-07-19 NOTE — Progress Notes (Signed)
[facility] Care Management referral received. However, noted patient's discharge plan is SNF. Therefore, [facility] Care Management not appropriate at this time. Marthenia Rolling, [facility] [phone]

## 2014-07-19 NOTE — Clinical Social Work Psychosocial (Signed)
Clinical Social Work Department BRIEF PSYCHOSOCIAL ASSESSMENT 07/19/2014  Patient:  [name], [name]     Account Number:  [id number]     Admit date:  07/16/2014  Clinical Social Worker:  Frederico Hamman  Date/Time:  07/19/2014 11:11 AM  Referred by:  Physician  Date Referred:  07/17/2014 Referred for  SNF Placement   Other Referral:   Interview type:  Patient Other interview type:    PSYCHOSOCIAL DATA Living Status:  ALONE Admitted from facility:   Level of care:   Primary support name:  Maryruth Bun Primary support relationship to patient:  FAMILY Degree of support available:   Ms. Berenice Primas is patient's cousin and his contact person.    CURRENT CONCERNS Current Concerns  Post-Acute Placement   Other Concerns:    SOCIAL WORK ASSESSMENT / PLAN On 07/18/14 CSW visited with patient to complete assessment. Patient had a towel covering his head and face but removed it to talk with CSW. Discussed recommendation of short-term rehab and patient is in agreement and he expressed no preference concerning a facility, but did inform CSW where he did not want to go.   Assessment/plan status:  Psychosocial Support/Ongoing Assessment of Needs Other assessment/ plan:   Information/referral to community resources:   Patient provided with list of skilled facilities in [place].    PATIENT'S/FAMILY'S RESPONSE TO PLAN OF CARE: 07/18/14 - Patient receptive to talking with CSW and in agreement with [facility] rehab.

## 2014-07-20 LAB — FOLATE: Folate: >20 ng/mL

## 2014-07-20 LAB — FERRITIN: Ferritin: 35 ng/mL (ref 22–322)

## 2014-07-20 LAB — VITAMIN B12: VITAMIN B 12: 707 pg/mL (ref 211–911)

## 2014-07-23 ENCOUNTER — Other Ambulatory Visit: Payer: Self-pay | Admitting: *Deleted

## 2014-07-23 DIAGNOSIS — D509 Iron deficiency anemia, unspecified: Secondary | ICD-10-CM

## 2014-07-23 NOTE — Telephone Encounter (Signed)
[facility] needs refill Lisinopril 10mg  #30 written 06/28/14. Hilda Blades Jawanda Passey RN 07/23/14 2:25PM

## 2014-07-24 MED ORDER — FOLIC ACID 1 MG PO TABS: 1.0000 mg | ORAL_TABLET | Freq: Every day | ORAL | Status: DC

## 2014-09-18 ENCOUNTER — Telehealth: Payer: Self-pay | Admitting: *Deleted

## 2014-09-18 NOTE — Telephone Encounter (Signed)
Return message left on triage line to [phone] - message left [facility] returning call 2:45PM and again 3:40PM. No word back about pt. Hilda Blades Avraj Lindroth RN 09/18/14 4:55PM

## 2015-08-11 DIAGNOSIS — A029 Salmonella infection, unspecified: Secondary | ICD-10-CM

## 2015-08-11 HISTORY — DX: Salmonella infection, unspecified: A02.9

## 2015-10-27 IMAGING — CR DG CHEST 2V
1 series · 1 of 1 positions shown · non-contrast
Comparison: NM BONE WHOLE BODY dated 12/26/2012; DG CHEST 2 VIEW
dated 12/17/2012

CLINICAL DATA: Extremity weakness

EXAM:
CHEST  2 VIEW

[view not recorded]
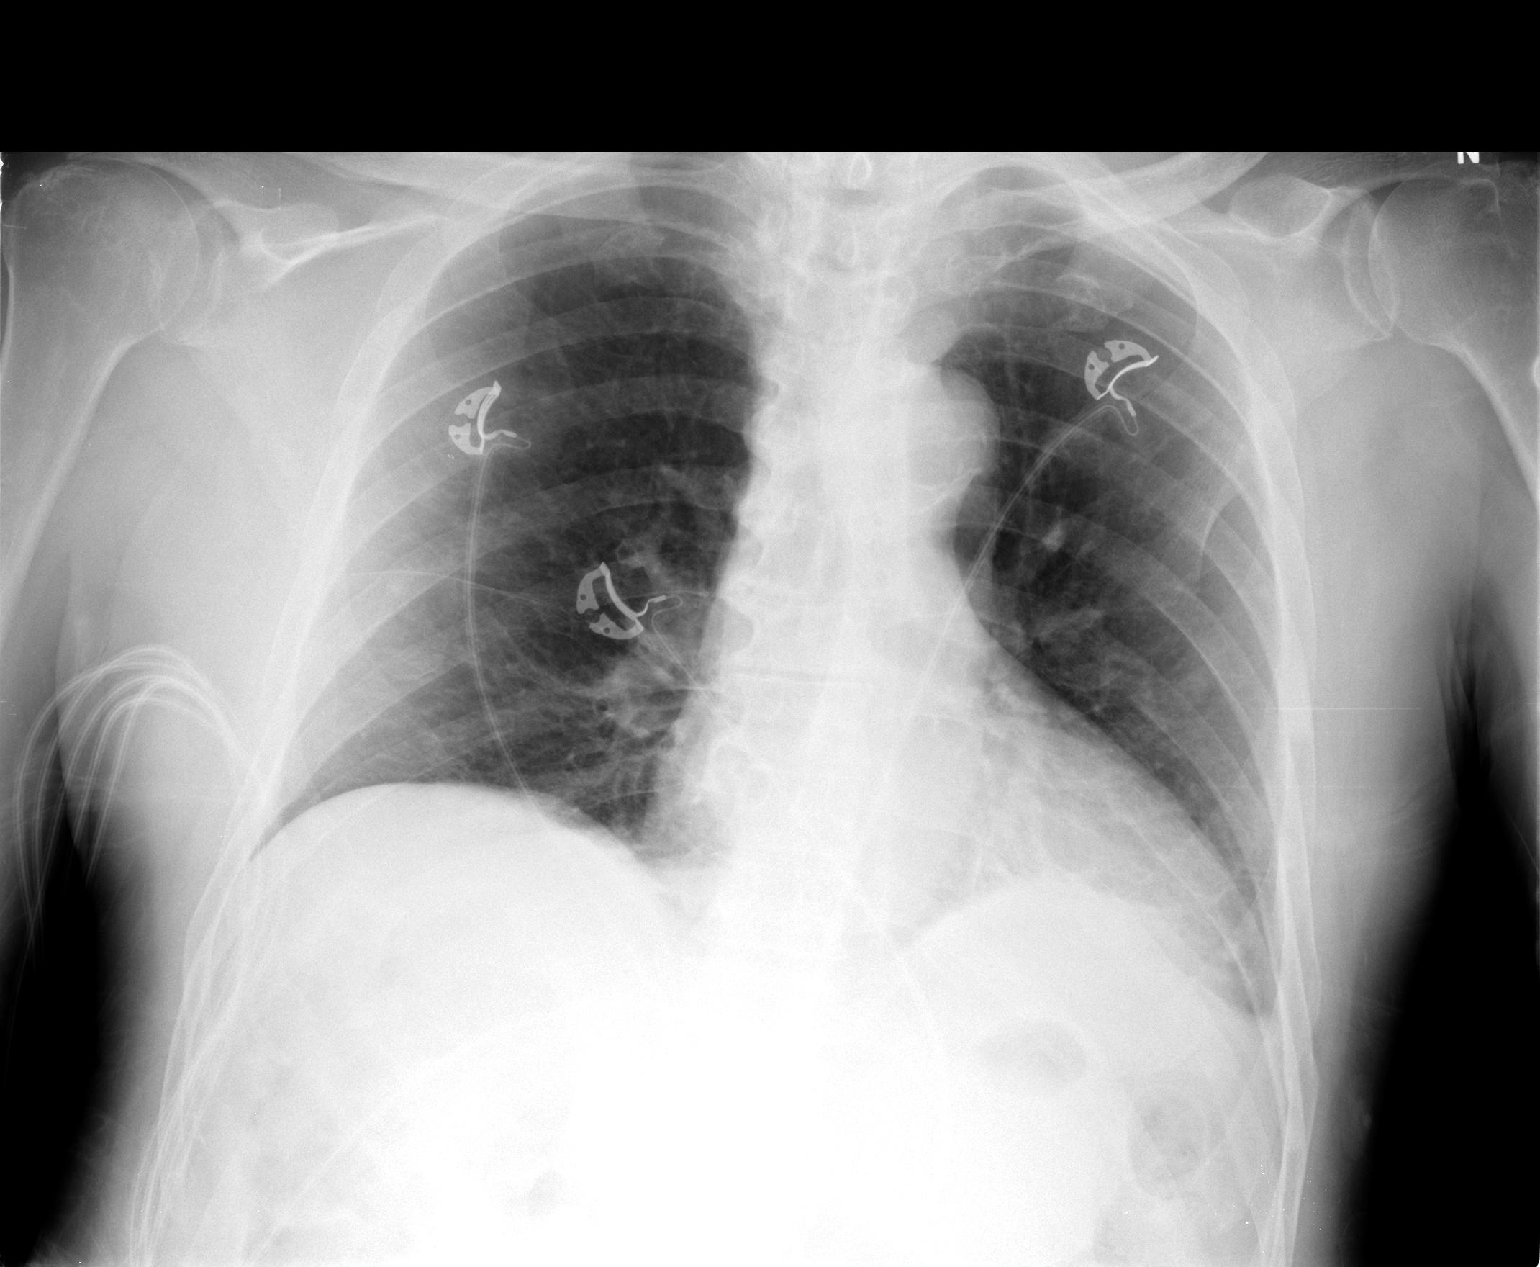

[1 of 1 positions shown; findings below may reference images not displayed]

FINDINGS: Normal cardiac silhouette. There is left lower lobe opacity
representing atelectasis or infiltrate. Right lung is clear. No
pneumothorax. Low lung volumes.
IMPRESSION: Right base atelectasis versus infiltrate.

## 2015-11-03 IMAGING — CT CT HEAD W/O CM
2 series · 16 of 30 positions shown, 18 images · non-contrast
Comparison: Prior CT from 10/24/2013

CLINICAL DATA: Left-sided weakness

EXAM:
CT HEAD WITHOUT CONTRAST
TECHNIQUE: Contiguous axial images were obtained from the base of the skull
through the vertex without intravenous contrast.

[Series 2: head w/o · axial · non-contrast · 0.49mm/px · z∈[+108,+238]mm · 8 of 34 slices shown, 10 images]
[im 4/34  brain]
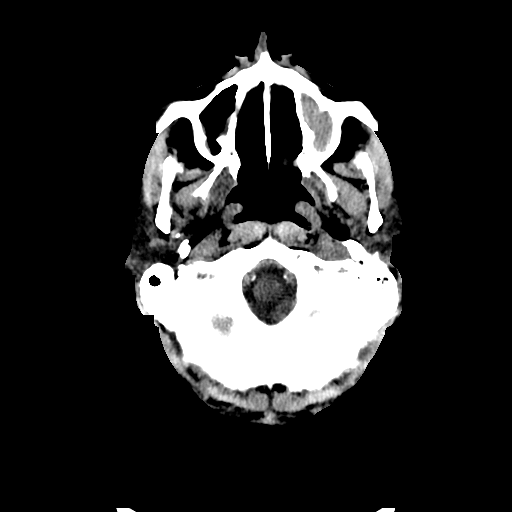
[im 4/34  bone]
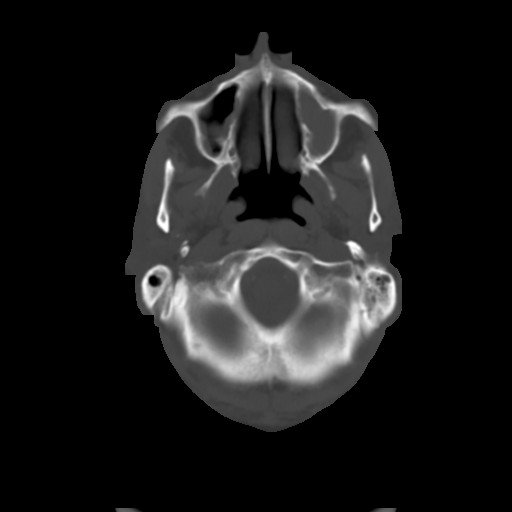
[im 8/34  brain]
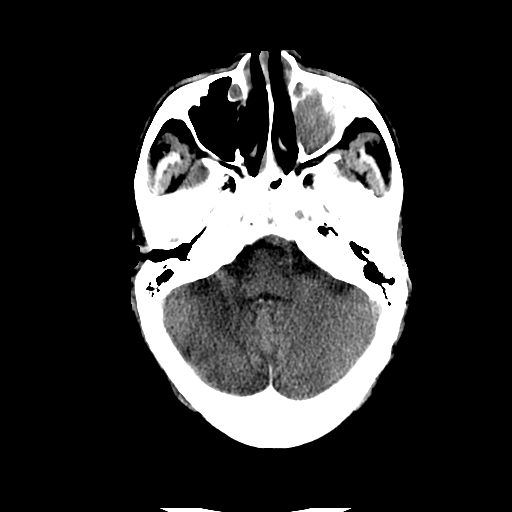
[im 12/34  brain]
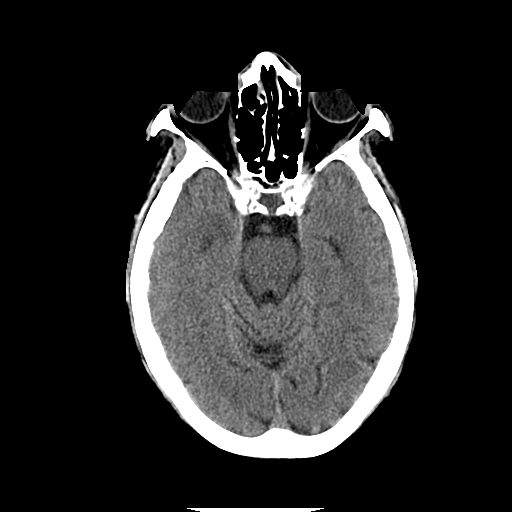
[im 15/34  brain]
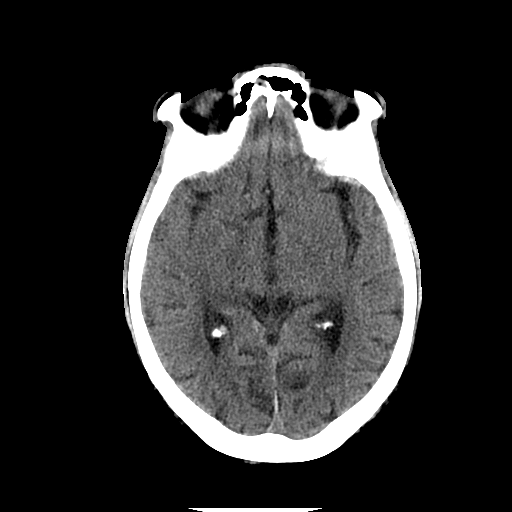
[im 19/34  brain]
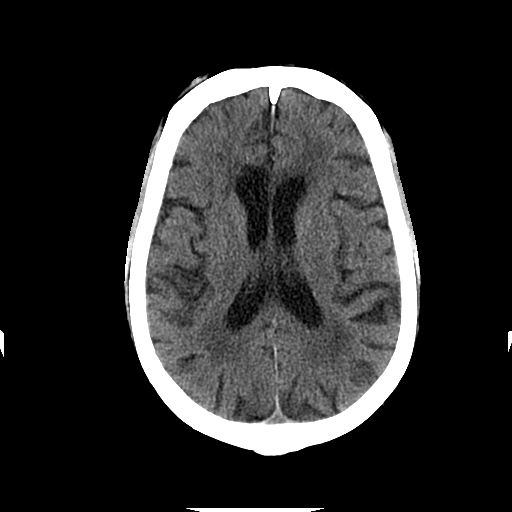
[im 19/34  bone]
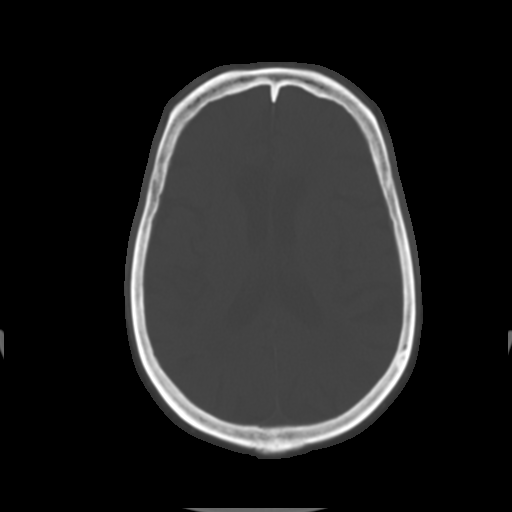
[im 23/34  brain]
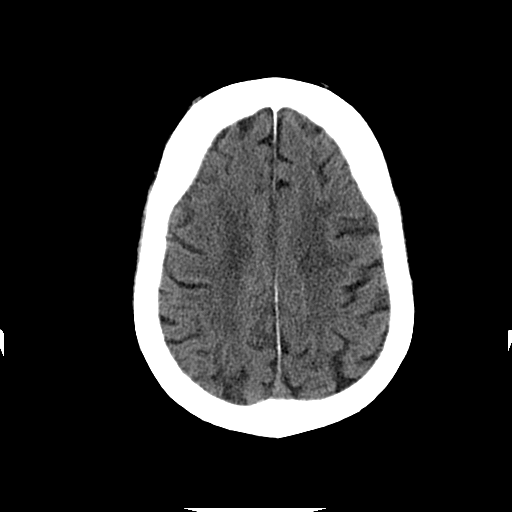
[im 26/34  brain]
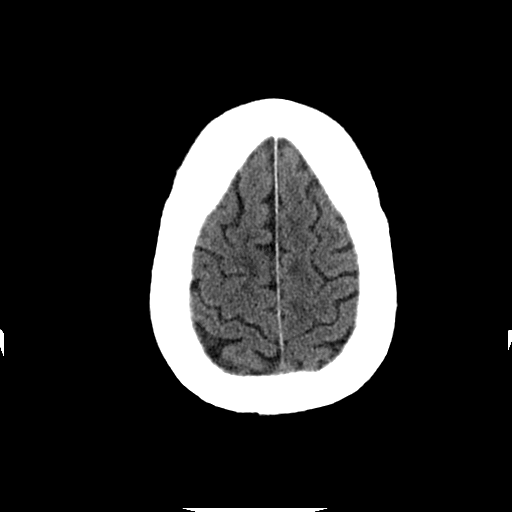
[im 30/34  brain]
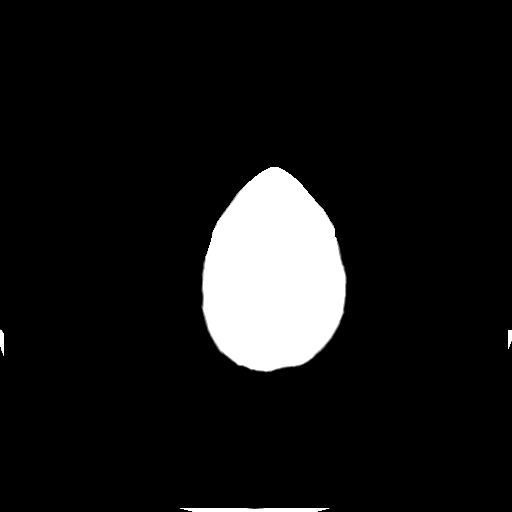

[Series 3: head w/o bone · axial · non-contrast · 0.49mm/px · z∈[+108,+240]mm · 8 of 67 slices shown]
[im 7/67  bone]
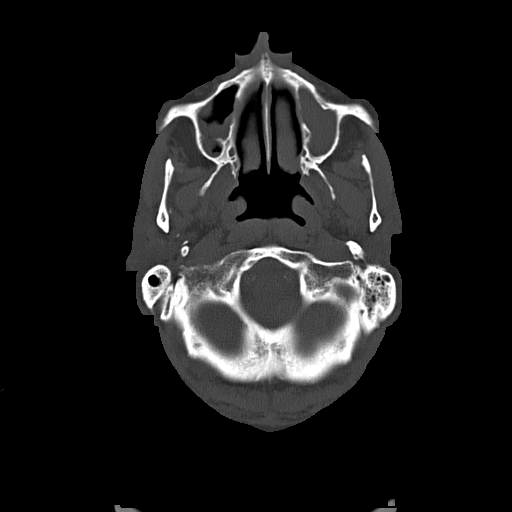
[im 14/67  bone]
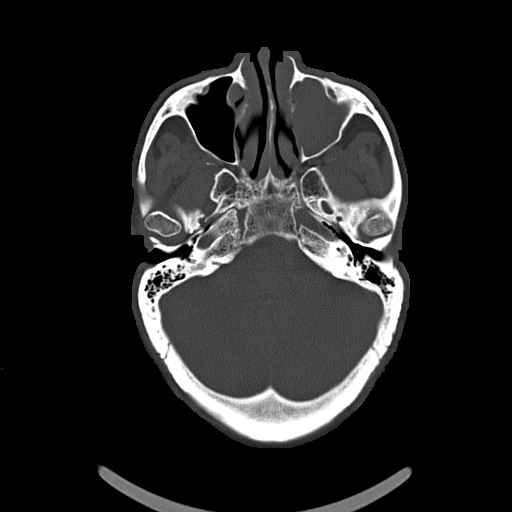
[im 21/67  bone]
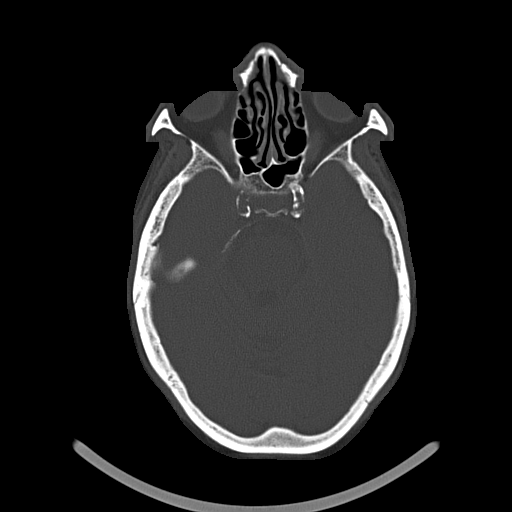
[im 28/67  bone]
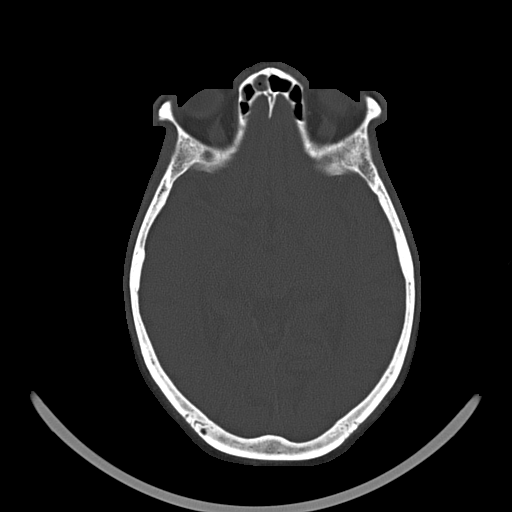
[im 39/67  bone]
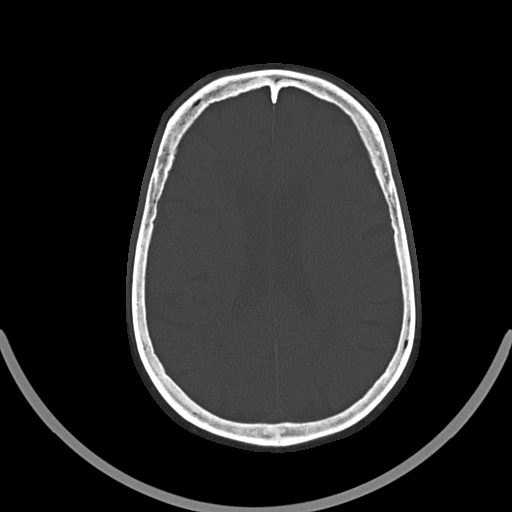
[im 46/67  bone]
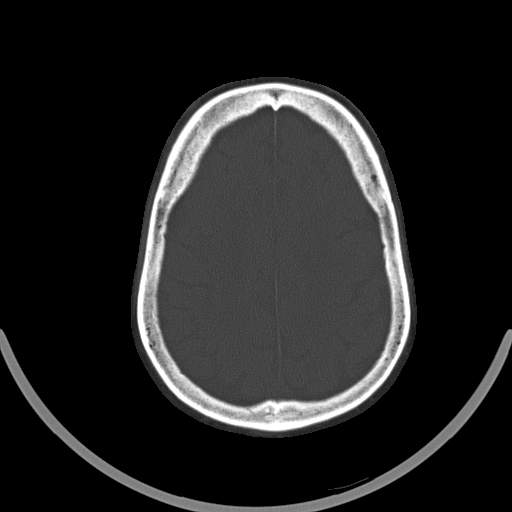
[im 53/67  bone]
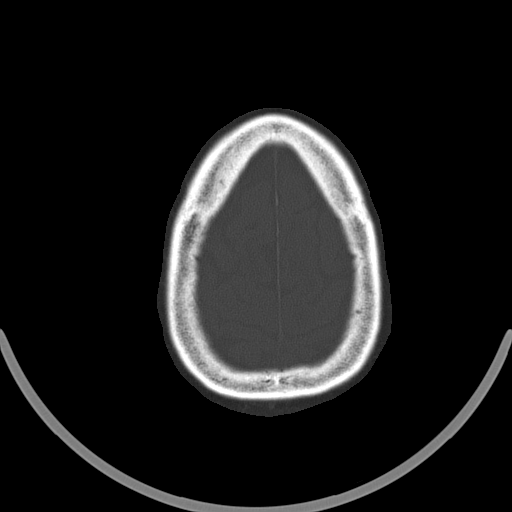
[im 60/67  bone]
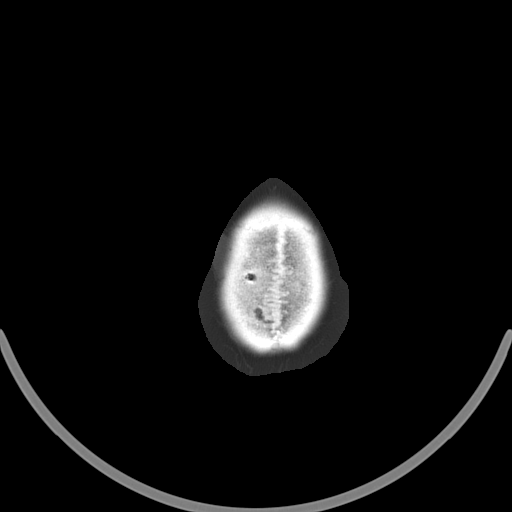

[16 of 30 positions shown; findings below may reference images not displayed]

FINDINGS: Encephalomalacia within the right cerebellar hemisphere again seen,
Stable relative to prior exam. Mild atrophy with moderate chronic
microvascular ischemic changes are also stable. Prominent
atherosclerotic calcifications noted within the carotid siphons and
distal vertebral arteries.

There is no acute intracranial hemorrhage or large vessel territory
infarct. No mass lesion or midline shift. Gray-white matter
differentiation is well maintained. Ventricles are normal in size
without evidence of hydrocephalus. No extra-axial fluid collection.

The calvarium is intact.

Orbital soft tissues are within normal limits.

There is complete opacification of the left maxillary sinus with
mild polypoid opacity within the right maxillary sinus. Minimal
opacity seen within the inferior right frontal sinus. No mastoid
effusion.

Scalp soft tissues are unremarkable.
IMPRESSION: 1. No acute intracranial process identified.
2. Mild atrophy with moderate chronic microvascular ischemic disease
and remote right cerebellar infarct, unchanged.
3. Paranasal sinus disease as above, similar relative to recent CT
from 10/24/2013.

## 2015-12-30 ENCOUNTER — Telehealth: Payer: Self-pay | Admitting: Hematology

## 2015-12-30 NOTE — Telephone Encounter (Signed)
Pt's family has called to get pt a hem appt. The referral was sent but no records accompanied the referral, the records have been requested. Pt is aware that if records arent received his appointment will be rescheduled.

## 2016-01-02 ENCOUNTER — Ambulatory Visit: Payer: Self-pay | Admitting: Hematology

## 2016-01-02 ENCOUNTER — Encounter: Payer: Self-pay | Admitting: Hematology

## 2016-01-09 ENCOUNTER — Telehealth: Payer: Self-pay | Admitting: Hematology

## 2016-01-09 NOTE — Telephone Encounter (Signed)
Pt no showed for appt. Various attempts to contact numbers in EPIC. Contact person's number in not working, home phone just rings.  Referring office was contacted regarding pt's no show and our inability to reschedule due to no updated contact numbers. Referral has been cancelled at this time

## 2016-01-15 ENCOUNTER — Encounter: Payer: Self-pay | Admitting: Hematology

## 2016-01-15 ENCOUNTER — Telehealth: Payer: Self-pay | Admitting: Hematology

## 2016-01-15 NOTE — Telephone Encounter (Signed)
Returned call to Anita regarding rescheduling a new pt appt. I reviewed the no show policy with caregiver, verified address and insurance, caregiver will bring in [employer] card, mailed new pt packet

## 2016-01-23 ENCOUNTER — Encounter: Payer: Self-pay | Admitting: Hematology

## 2016-01-23 ENCOUNTER — Ambulatory Visit (HOSPITAL_BASED_OUTPATIENT_CLINIC_OR_DEPARTMENT_OTHER): Payer: Medicare Other | Admitting: Hematology

## 2016-01-23 ENCOUNTER — Telehealth: Payer: Self-pay | Admitting: Hematology

## 2016-01-23 ENCOUNTER — Other Ambulatory Visit (HOSPITAL_BASED_OUTPATIENT_CLINIC_OR_DEPARTMENT_OTHER): Payer: Medicare Other

## 2016-01-23 VITALS — BP 184/75 | HR 77 | Temp 97.7°F | Resp 19

## 2016-01-23 DIAGNOSIS — D509 Iron deficiency anemia, unspecified: Secondary | ICD-10-CM | POA: Diagnosis not present

## 2016-01-23 DIAGNOSIS — N182 Chronic kidney disease, stage 2 (mild): Secondary | ICD-10-CM

## 2016-01-23 DIAGNOSIS — D630 Anemia in neoplastic disease: Secondary | ICD-10-CM

## 2016-01-23 DIAGNOSIS — D631 Anemia in chronic kidney disease: Secondary | ICD-10-CM

## 2016-01-23 DIAGNOSIS — D500 Iron deficiency anemia secondary to blood loss (chronic): Secondary | ICD-10-CM

## 2016-01-23 DIAGNOSIS — Z8546 Personal history of malignant neoplasm of prostate: Secondary | ICD-10-CM | POA: Diagnosis not present

## 2016-01-23 DIAGNOSIS — C61 Malignant neoplasm of prostate: Secondary | ICD-10-CM | POA: Insufficient documentation

## 2016-01-23 DIAGNOSIS — C7951 Secondary malignant neoplasm of bone: Secondary | ICD-10-CM

## 2016-01-23 LAB — COMPREHENSIVE METABOLIC PANEL
ALT: 25 U/L (ref 0–55)
AST: 24 U/L (ref 5–34)
Albumin: 3.4 g/dL — ABNORMAL LOW (ref 3.5–5.0)
Alkaline Phosphatase: 72 U/L (ref 40–150)
Anion Gap: 5 mEq/L (ref 3–11)
BUN: 10.9 mg/dL (ref 7.0–26.0)
CALCIUM: 9.9 mg/dL (ref 8.4–10.4)
CO2: 31 meq/L — AB (ref 22–29)
CREATININE: 1 mg/dL (ref 0.7–1.3)
Chloride: 102 mEq/L (ref 98–109)
EGFR: >90 mL/min/{1.73_m2} (ref >90)
Glucose: 95 mg/dl (ref 70–140)
Potassium: 4.3 mEq/L (ref 3.5–5.1)
Sodium: 139 mEq/L (ref 136–145)
Total Bilirubin: <0.3 mg/dL (ref 0.20–1.20)
Total Protein: 8.4 g/dL — ABNORMAL HIGH (ref 6.4–8.3)

## 2016-01-23 LAB — CBC & DIFF AND RETIC
BASO%: 0.2 % (ref 0.0–2.0)
Basophils Absolute: 0 10*3/uL (ref 0.0–0.1)
EOS ABS: 0.1 10*3/uL (ref 0.0–0.5)
EOS%: 1.8 % (ref 0.0–7.0)
HEMATOCRIT: 29 % — AB (ref 38.4–49.9)
HEMOGLOBIN: 8.3 g/dL — AB (ref 13.0–17.1)
Immature Retic Fract: 22.1 % — ABNORMAL HIGH (ref 3.00–10.60)
LYMPH%: 26.8 % (ref 14.0–49.0)
MCH: 23.6 pg — ABNORMAL LOW (ref 27.2–33.4)
MCHC: 28.6 g/dL — ABNORMAL LOW (ref 32.0–36.0)
MCV: 82.6 fL (ref 79.3–98.0)
MONO#: 0.8 10*3/uL (ref 0.1–0.9)
MONO%: 13.5 % (ref 0.0–14.0)
NEUT#: 3.5 10*3/uL (ref 1.5–6.5)
NEUT%: 57.7 % (ref 39.0–75.0)
PLATELETS: 236 10*3/uL (ref 140–400)
RBC: 3.51 10*6/uL — ABNORMAL LOW (ref 4.20–5.82)
RDW: 17.4 % — ABNORMAL HIGH (ref 11.0–14.6)
Retic %: 4.01 % — ABNORMAL HIGH (ref 0.80–1.80)
Retic Ct Abs: 140.75 10*3/uL — ABNORMAL HIGH (ref 34.80–93.90)
WBC: 6.1 10*3/uL (ref 4.0–10.3)
lymph#: 1.6 10*3/uL (ref 0.9–3.3)

## 2016-01-23 LAB — FERRITIN: FERRITIN: 18 ng/mL — AB (ref 22–316)

## 2016-01-23 LAB — IRON AND TIBC
%SAT: 13 % — AB (ref 20–55)
Iron: 50 ug/dL (ref 42–163)
TIBC: 401 ug/dL (ref 202–409)
UIBC: 351 ug/dL (ref 117–376)

## 2016-01-23 LAB — TECHNOLOGIST REVIEW

## 2016-01-23 NOTE — Progress Notes (Signed)
Marland Kitchen    HEMATOLOGY/ONCOLOGY CONSULTATION NOTE  Date of Service: 01/23/2016  Patient Care Team: No Pcp Per Patient as PCP - General (General Practice)  CHIEF COMPLAINTS/PURPOSE OF CONSULTATION:  Anemia  HISTORY OF PRESENTING ILLNESS:  [name] is a wonderful 65 y.o. male who has been referred to [place] by Dr Benita Stabile for evaluation and management of Anemia.  Patient has a h/o HTN, COPD, CVA with left sided weakness, CKD stage II,  prostate cancer (patient reports this was diagnosed 43yrs ago abd that he follows with [facility] and has been on lupron q19months for this ? Bone mets). He reports that he has lost a significant amount of weight but has been regaining his weight. Has been using 3 cans of boost daily in addition to meals. He notes his makes his own decision. Arrives in a wheelchair with transport assistant from his NH where he has been for the last 3 yrs.  He reports having needed PRBC transfusion 2 yrs ago but none recently. Notes fatigue. Denies overt GI bleeding/hematuria/epistaxis or other bleeding.  As per labs drwan at his NH in May 2017 his hgb was 7.3 with an MCV of 85.3, nl WBC count of 7.2k and low ferritin of 12.8 with iron saturation of 11%. He has had hgb in the mid 7 range in 11/2015 as well.  He reports no CP/overt SOB at rest. No dizziness or presyncopal symptoms at this time.  He has been on ferrous sulfate 1 tab po BID and folic acid replacement.    MEDICAL HISTORY:  Past Medical History  Diagnosis Date  . Hypertension 05/18/2007  . COPD 05/18/2007  . Elevated PSA 05/25/2007  . Prostate cancer ([place])   . Seizures ([place]) 12/05/2013; 12/08/2013; 12/11/2013    new onset; recurrent/notes 12/12/2013  . Stroke ([place]) 10/14/2010, 11/2012    Right centrum semiovale  . Cerebral embolism with cerebral infarction ([place])     residual left sided weakness/notes 12/12/2013  . Protein-calorie malnutrition, severe ([place])     [name] 12/12/2013  . Chronic kidney disease  (CKD), stage II (mild)     [name] 12/12/2013  . Colon polyps 10/20/2013    TUBULAR ADENOMA (4).    SURGICAL HISTORY: Past Surgical History  Procedure Laterality Date  . 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 Multiple extractions with alveoloplasty N/A 10/23/2013    Procedure: Extraction of tooth #'s 1,2,3,4,5,6,7,11,12,13,14,15,16,20,21,22,23,24,27,28,29,32 with alveoloplasty and bilateral maxillary tuberosity reductions;  Surgeon: Lenn Cal, DDS;  Location: [facility];  Service: Oral Surgery;  Laterality: N/A;  . Tibia fracture surgery Right     pin  . Tonsillectomy    . Colonoscopy w/ biopsies  2015    4 polyps, tubular adenomas, pan diverticulosis, one lipoma.   . Peg tube placement  12/2012    per IR  . Esophagogastroduodenoscopy N/A 06/06/2014    Procedure: ESOPHAGOGASTRODUODENOSCOPY (EGD);  Surgeon: Gatha Mayer, MD;  Location: [facility] ENDOSCOPY;  Service: Endoscopy;  Laterality: N/A;    SOCIAL HISTORY: Social History   Social History  . Marital Status: Single    Spouse Name: N/A  . Number of Children: 0  . Years of Education: N/A   Occupational History  . Flooring instalation     Retired   Social History Main Topics  . Smoking status: Current Some Day Smoker -- 0.10 packs/day for 50 years    Types: Cigarettes  . Smokeless tobacco: Never Used     Comment: 1/2 ppd hx, now  3-4 cigs/day. Form given 05-14-14  . Alcohol Use: 0.0 oz/week    0 Cans of beer per week  . Drug Use: No     Comment: history of cocaine use  . Sexual Activity: Not Currently   Other Topics Concern  . Not on file   Social History Narrative   Used to drink 1/2 pt of wine daily and beer daily (at least 12 oz).  as of 06/03/14 denies recent ETOH.    Denies recreational drugs but tox screen + for cocaine in past.     Smokes 3-4 cigarettes. 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    2 years of college at [employer] per patient                  FAMILY HISTORY: Family History  Problem Relation Age of Onset  . Liver disease Mother   . Heart attack Mother 56  . Colon cancer Neg Hx     ALLERGIES:  is allergic to aspirin; penicillins; and other.  MEDICATIONS:  Current Outpatient Prescriptions  Medication Sig Dispense Refill  . albuterol (PROVENTIL HFA;VENTOLIN HFA) 108 (90 BASE) MCG/ACT inhaler Inhale 2 puffs into the lungs every 6 (six) hours as needed for wheezing. 8.5 g 3  . atorvastatin (LIPITOR) 80 MG tablet Take 80 mg by mouth at bedtime.    . chlorhexidine (PERIDEX) 0.12 % solution Use as directed 15 mLs in the mouth or throat daily.    . clopidogrel (PLAVIX) 75 MG tablet Take 1 tablet (75 mg total) by mouth daily with breakfast. 90 tablet 1  . diclofenac sodium (VOLTAREN) 1 % GEL Apply 1 application topically 3 (three) times daily as needed (knee pain).    . ferrous sulfate 325 (65 FE) MG tablet Take 1 tablet (325 mg total) by mouth 2 (two) times daily with a meal. 30 tablet 5  . folic acid (FOLVITE) 1 MG tablet Take 1 tablet (1 mg total) by mouth daily. 30 tablet 5  . levETIRAcetam (KEPPRA) 500 MG tablet Take 1,500 mg by mouth 2 (two) times daily.    . Multiple Vitamin (MULTIVITAMIN WITH MINERALS) TABS tablet Take 1 tablet by mouth daily.    . pantoprazole (PROTONIX) 40 MG tablet Take 1 tablet (40 mg total) by mouth daily. 90 tablet 1  . senna-docusate (SENOKOT-S) 8.6-50 MG per tablet Take 1 tablet by mouth at bedtime as needed for mild constipation. 30 tablet 2  . tiotropium (SPIRIVA HANDIHALER) 18 MCG inhalation capsule Place 1 capsule (18 mcg total) into inhaler and inhale daily. 90 capsule 4   No current facility-administered medications for this visit.    REVIEW OF SYSTEMS:    10 Point review of Systems was done is negative except as noted above.  PHYSICAL EXAMINATION: ECOG PERFORMANCE STATUS: 3 - Symptomatic, >50% confined to bed  . Filed Vitals:   01/23/16 1024  BP: 184/75  Pulse: 77  Temp: 97.7 F  (36.5 C)  Resp: 19   Filed Weights   .There is no weight on file to calculate BMI.  GENERAL:alert, in no acute distress and comfortable sitting in wheelchair. Speech somewhat difficult to understand due to CVA SKIN: skin color, texture, turgor are normal, no rashes or significant lesions EYES: normal, conjunctiva are pink and non-injected, sclera clear OROPHARYNX:no exudate, no erythema and lips, buccal mucosa, and tongue normal  NECK: supple, no JVD, thyroid normal size, non-tender, without nodularity LYMPH:  no palpable  lymphadenopathy in the cervical, axillary or inguinal LUNGS: clear to auscultation with normal respiratory effort HEART: regular rate & rhythm,  no murmurs and no lower extremity edema ABDOMEN: abdomen soft, non-tender, normoactive bowel sounds  PSYCH: alert & oriented x 3 with speech that if somewhat altered and difficult to understand but understandable with attention. NEURO: left sided weakness - detailed neuro examination not done  LABORATORY DATA:  I have reviewed the data as listed  . CBC Latest Ref Rng 01/23/2016 07/19/2014 07/18/2014  WBC 4.0 - 10.3 10e3/uL 6.1 8.8 7.2  Hemoglobin 13.0 - 17.1 g/dL 8.3(L) 7.3(L) 7.4(L)  Hematocrit 38.4 - 49.9 % 29.0(L) 22.7(L) 23.4(L)  Platelets 140 - 400 10e3/uL 236 236 239    .Marland Kitchen CBC    Component Value Date/Time   WBC 6.1 01/23/2016 1237   WBC 8.8 07/19/2014 0400   RBC 3.51* 01/23/2016 1237   RBC 3.01* 07/19/2014 0400   RBC 3.20* 07/18/2014 0835   HGB 8.3* 01/23/2016 1237   HGB 7.3* 07/19/2014 0400   HCT 29.0* 01/23/2016 1237   HCT 22.7* 07/19/2014 0400   PLT 236 01/23/2016 1237   PLT 236 07/19/2014 0400   MCV 82.6 01/23/2016 1237   MCV 75.4* 07/19/2014 0400   MCH 23.6* 01/23/2016 1237   MCH 24.3* 07/19/2014 0400   MCHC 28.6* 01/23/2016 1237   MCHC 32.2 07/19/2014 0400   RDW 17.4* 01/23/2016 1237   RDW 21.9* 07/19/2014 0400   LYMPHSABS 1.6 01/23/2016 1237   LYMPHSABS 2.1 07/16/2014 0053   MONOABS 0.8  01/23/2016 1237   MONOABS 0.4 07/16/2014 0053   EOSABS 0.1 01/23/2016 1237   EOSABS 0.0 07/16/2014 0053   BASOSABS 0.0 01/23/2016 1237   BASOSABS 0.0 07/16/2014 0053     CMP Latest Ref Rng 01/23/2016 01/23/2016 07/19/2014  Glucose 70 - 140 mg/dl 95 - 90  BUN 7.0 - 26.0 mg/dL 10.9 - 38(H)  Creatinine 0.7 - 1.3 mg/dL 1.0 - 1.22  Sodium 136 - 145 mEq/L 139 - 141  Potassium 3.5 - 5.1 mEq/L 4.3 - 3.6(L)  Chloride 96 - 112 mEq/L - - 109  CO2 22 - 29 mEq/L 31(H) - 21  Calcium 8.4 - 10.4 mg/dL 9.9 - 9.3  Total Protein 6.0 - 8.5 g/dL 8.4(H) 8.0 -  Total Bilirubin 0.20 - 1.20 mg/dL <0.30 - -  Alkaline Phos 40 - 150 U/L 72 - -  AST 5 - 34 U/L 24 - -  ALT 0 - 55 U/L 25 - -   Component     Latest Ref Rng 01/23/2016  IgG (Immunoglobin G), Serum     700 - 1600 mg/dL 2,715 (H)  IgA/Immunoglobulin A, Serum     61 - 437 mg/dL 278  IgM, Qn, Serum     20 - 172 mg/dL 102  Total Protein     6.0 - 8.5 g/dL 8.0  Albumin SerPl Elph-Mcnc     2.9 - 4.4 g/dL 3.7  Alpha 1     0.0 - 0.4 g/dL 0.2  Alpha2 Glob SerPl Elph-Mcnc     0.4 - 1.0 g/dL 0.6  B-Globulin SerPl Elph-Mcnc     0.7 - 1.3 g/dL 1.1  Gamma Glob SerPl Elph-Mcnc     0.4 - 1.8 g/dL 2.4 (H)  M Protein SerPl Elph-Mcnc     Not Observed g/dL 0.8 (H)  Globulin, Total     2.2 - 3.9 g/dL 4.3 (H)  Albumin/Glob SerPl     0.7 - 1.7 0.9  IFE 1  Comment  Please Note (HCV):      Comment  Iron     42 - 163 ug/dL 50  TIBC     202 - 409 ug/dL 401  UIBC     117 - 376 ug/dL 351  %SAT     20 - 55 % 13 (L)  Ferritin     22 - 316 ng/ml 18 (L)  PSA     0.0 - 4.0 ng/mL 1.7  Vitamin B12     211 - 946 pg/mL 853   Comments: Immunofixation shows IgG monoclonal protein with lambda light chain  Specificity.   RADIOGRAPHIC STUDIES: I have personally reviewed the radiological images as listed and agreed with the findings in the report. No results found.  ASSESSMENT & PLAN:   65 yo with multiple medical co-morbids and poor functional  status due to CVA with left sided weakness with   1) Normocytic Normochromic Anemia - likely multifactorial Noted to have significant Iron deficiency despite being on oral iron replacement. Unclear source of bleeding. 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 Less likely Myeloma   PLAN -based on goals of care might need additional search for source of blood loss. -no immediate indication for transfusion at this time -will correct iron deficiency aggressively to ferritin >100 -if still anemic with hgb < 10 might need to consider ESA's for his CKD. -rpt SPEP in 3 months to monitor M protein. -prn PRBC transfusion for symptomatic anemia  3) Prostate cancer - unclear details. Patient reports he is currently on lupron as per [facility] Plan -continue f/u with [facility]. -will get their notes to determine additional details regarding prostate cancer.  RTC for IV feraheme 510mg  qweekly x 2 doses RTC with Dr Irene Limbo in 4-6 weeks to determine response to IV iron replacement  All of the patients questions were answered with apparent satisfaction. The patient knows to call the clinic with any problems, questions or concerns.  I spent 60 minutes counseling the patient face to face. The total time spent in the appointment was 60 minutes and more than 50% was on counseling and direct patient cares.    Sullivan Lone MD [place] AAHIVMS [facility] Hematology/Oncology Physician [facility]  (Office):       [phone] (Work cell):  [phone] (Fax):           [phone]  01/23/2016 11:25 AM

## 2016-01-23 NOTE — Telephone Encounter (Signed)
Gave and printed appt sched and avs fo rpt for June and JUly  °

## 2016-01-24 LAB — PSA: PROSTATE SPECIFIC AG, SERUM: 1.7 ng/mL (ref 0.0–4.0)

## 2016-01-24 LAB — VITAMIN B12: Vitamin B12: 853 pg/mL (ref 211–946)

## 2016-01-27 LAB — MULTIPLE MYELOMA PANEL, SERUM
ALBUMIN SERPL ELPH-MCNC: 3.7 g/dL (ref 2.9–4.4)
Albumin/Glob SerPl: 0.9 (ref 0.7–1.7)
Alpha 1: 0.2 g/dL (ref 0.0–0.4)
Alpha2 Glob SerPl Elph-Mcnc: 0.6 g/dL (ref 0.4–1.0)
B-Globulin SerPl Elph-Mcnc: 1.1 g/dL (ref 0.7–1.3)
GAMMA GLOB SERPL ELPH-MCNC: 2.4 g/dL — AB (ref 0.4–1.8)
Globulin, Total: 4.3 g/dL — ABNORMAL HIGH (ref 2.2–3.9)
IGA/IMMUNOGLOBULIN A, SERUM: 278 mg/dL (ref 61–437)
IgG, Qn, Serum: 2715 mg/dL — ABNORMAL HIGH (ref 700–1600)
IgM, Qn, Serum: 102 mg/dL (ref 20–172)
M Protein SerPl Elph-Mcnc: 0.8 g/dL — ABNORMAL HIGH
TOTAL PROTEIN: 8 g/dL (ref 6.0–8.5)

## 2016-02-06 ENCOUNTER — Ambulatory Visit (HOSPITAL_BASED_OUTPATIENT_CLINIC_OR_DEPARTMENT_OTHER): Payer: Medicare Other

## 2016-02-06 VITALS — BP 170/75 | HR 69 | Temp 98.3°F | Resp 18

## 2016-02-06 DIAGNOSIS — D500 Iron deficiency anemia secondary to blood loss (chronic): Secondary | ICD-10-CM | POA: Diagnosis not present

## 2016-02-06 MED ORDER — LORATADINE 10 MG PO TABS
10.0000 mg | ORAL_TABLET | Freq: Once | ORAL | Status: AC
  Administered 2016-02-06: 10 mg via ORAL
  Filled 2016-02-06: qty 1

## 2016-02-06 MED ORDER — SODIUM CHLORIDE 0.9 % IV SOLN
Freq: Once | INTRAVENOUS | Status: AC
  Administered 2016-02-06: 11:00:00 via INTRAVENOUS

## 2016-02-06 MED ORDER — ACETAMINOPHEN 325 MG PO TABS
650.0000 mg | ORAL_TABLET | Freq: Once | ORAL | Status: AC
  Administered 2016-02-06: 650 mg via ORAL

## 2016-02-06 MED ORDER — ACETAMINOPHEN 325 MG PO TABS
ORAL_TABLET | ORAL | Status: AC
  Filled 2016-02-06: qty 2

## 2016-02-06 MED ORDER — SODIUM CHLORIDE 0.9 % IV SOLN
510.0000 mg | Freq: Once | INTRAVENOUS | Status: AC
  Administered 2016-02-06: 510 mg via INTRAVENOUS
  Filled 2016-02-06: qty 17

## 2016-02-06 NOTE — Patient Instructions (Signed)

## 2016-02-13 ENCOUNTER — Ambulatory Visit (HOSPITAL_BASED_OUTPATIENT_CLINIC_OR_DEPARTMENT_OTHER): Payer: Medicare Other

## 2016-02-13 VITALS — BP 144/86 | HR 64 | Temp 97.9°F | Resp 18

## 2016-02-13 DIAGNOSIS — D500 Iron deficiency anemia secondary to blood loss (chronic): Secondary | ICD-10-CM

## 2016-02-13 MED ORDER — FERUMOXYTOL INJECTION 510 MG/17 ML
510.0000 mg | Freq: Once | INTRAVENOUS | Status: AC
  Administered 2016-02-13: 510 mg via INTRAVENOUS
  Filled 2016-02-13: qty 17

## 2016-02-13 MED ORDER — SODIUM CHLORIDE 0.9 % IV SOLN
Freq: Once | INTRAVENOUS | Status: AC
  Administered 2016-02-13: 10:00:00 via INTRAVENOUS

## 2016-02-13 NOTE — Patient Instructions (Signed)

## 2016-02-20 ENCOUNTER — Telehealth: Payer: Self-pay | Admitting: Hematology

## 2016-02-20 ENCOUNTER — Encounter: Payer: Self-pay | Admitting: Hematology

## 2016-02-20 ENCOUNTER — Ambulatory Visit (HOSPITAL_BASED_OUTPATIENT_CLINIC_OR_DEPARTMENT_OTHER): Payer: Medicare Other | Admitting: Hematology

## 2016-02-20 ENCOUNTER — Other Ambulatory Visit: Payer: Self-pay | Admitting: *Deleted

## 2016-02-20 ENCOUNTER — Ambulatory Visit (HOSPITAL_BASED_OUTPATIENT_CLINIC_OR_DEPARTMENT_OTHER): Payer: Medicare Other

## 2016-02-20 VITALS — BP 168/76 | HR 69 | Temp 98.0°F | Resp 18

## 2016-02-20 DIAGNOSIS — D500 Iron deficiency anemia secondary to blood loss (chronic): Secondary | ICD-10-CM

## 2016-02-20 DIAGNOSIS — Z8546 Personal history of malignant neoplasm of prostate: Secondary | ICD-10-CM

## 2016-02-20 DIAGNOSIS — D472 Monoclonal gammopathy: Secondary | ICD-10-CM | POA: Diagnosis not present

## 2016-02-20 DIAGNOSIS — C7951 Secondary malignant neoplasm of bone: Principal | ICD-10-CM

## 2016-02-20 DIAGNOSIS — C61 Malignant neoplasm of prostate: Secondary | ICD-10-CM

## 2016-02-20 LAB — COMPREHENSIVE METABOLIC PANEL
ALBUMIN: 3.4 g/dL — AB (ref 3.5–5.0)
ALK PHOS: 74 U/L (ref 40–150)
ALT: 24 U/L (ref 0–55)
AST: 23 U/L (ref 5–34)
Anion Gap: 6 mEq/L (ref 3–11)
BUN: 7.5 mg/dL (ref 7.0–26.0)
CO2: 30 meq/L — AB (ref 22–29)
Calcium: 9.5 mg/dL (ref 8.4–10.4)
Chloride: 102 mEq/L (ref 98–109)
Creatinine: 0.9 mg/dL (ref 0.7–1.3)
EGFR: >90 mL/min/{1.73_m2} (ref >90)
GLUCOSE: 91 mg/dL (ref 70–140)
Potassium: 4.1 mEq/L (ref 3.5–5.1)
SODIUM: 139 meq/L (ref 136–145)
TOTAL PROTEIN: 8.2 g/dL (ref 6.4–8.3)

## 2016-02-20 LAB — CBC WITH DIFFERENTIAL/PLATELET
BASO%: 0.5 % (ref 0.0–2.0)
BASOS ABS: 0 10*3/uL (ref 0.0–0.1)
EOS ABS: 0.1 10*3/uL (ref 0.0–0.5)
EOS%: 2.9 % (ref 0.0–7.0)
HCT: 33.8 % — ABNORMAL LOW (ref 38.4–49.9)
HEMOGLOBIN: 10.2 g/dL — AB (ref 13.0–17.1)
LYMPH#: 1.4 10*3/uL (ref 0.9–3.3)
LYMPH%: 28.9 % (ref 14.0–49.0)
MCH: 24.6 pg — ABNORMAL LOW (ref 27.2–33.4)
MCHC: 30.2 g/dL — ABNORMAL LOW (ref 32.0–36.0)
MCV: 81.4 fL (ref 79.3–98.0)
MONO#: 0.6 10*3/uL (ref 0.1–0.9)
MONO%: 11.9 % (ref 0.0–14.0)
NEUT#: 2.8 10*3/uL (ref 1.5–6.5)
NEUT%: 55.8 % (ref 39.0–75.0)
Platelets: 227 10*3/uL (ref 140–400)
RBC: 4.16 10*6/uL — AB (ref 4.20–5.82)
RDW: 23 % — AB (ref 11.0–14.6)
WBC: 5 10*3/uL (ref 4.0–10.3)

## 2016-02-20 LAB — IRON AND TIBC
%SAT: 23 % (ref 20–55)
Iron: 69 ug/dL (ref 42–163)
TIBC: 294 ug/dL (ref 202–409)
UIBC: 225 ug/dL (ref 117–376)

## 2016-02-20 LAB — FERRITIN: Ferritin: 514 ng/ml — ABNORMAL HIGH (ref 22–316)

## 2016-02-20 NOTE — Telephone Encounter (Signed)
per pof to sch pt appt-gave pt copy of avs °

## 2016-02-20 NOTE — Telephone Encounter (Signed)
per pof to sch pt lab-sch-pt here

## 2016-03-09 NOTE — Progress Notes (Signed)
Marland Kitchen    HEMATOLOGY/ONCOLOGY CLINIC NOTE  Date of Service: 02/20/2016  Patient Care Team: No Pcp Per Patient as PCP - General (General Practice)  CHIEF COMPLAINTS/PURPOSE OF CONSULTATION:  Anemia  HISTORY OF PRESENTING ILLNESS:  [name] is a wonderful 65 y.o. male who has been referred to [place] by Dr Benita Stabile for evaluation and management of Anemia.  Patient has a h/o HTN, COPD, CVA with left sided weakness, CKD stage II,  prostate cancer (patient reports this was diagnosed 14yrs ago abd that he follows with [facility] and has been on lupron q5months for this ? Bone mets). He reports that he has lost a significant amount of weight but has been regaining his weight. Has been using 3 cans of boost daily in addition to meals. He notes his makes his own decision. Arrives in a wheelchair with transport assistant from his NH where he has been for the last 3 yrs.  He reports having needed PRBC transfusion 2 yrs ago but none recently. Notes fatigue. Denies overt GI bleeding/hematuria/epistaxis or other bleeding.  As per labs drwan at his NH in May 2017 his hgb was 7.3 with an MCV of 85.3, nl WBC count of 7.2k and low ferritin of 12.8 with iron saturation of 11%. He has had hgb in the mid 7 range in 11/2015 as well.  He reports no CP/overt SOB at rest. No dizziness or presyncopal symptoms at this time.  He has been on ferrous sulfate 1 tab po BID and folic acid replacement.  INTERVAL HISTORY  Patient is here for follow-up from his SNF with his attendant for follow-up of his iron deficiency anemia. He reports no acute new symptoms. Did not have any issues tolerating his IV iron infusions. His hemoglobin has improved from about 8 to 10.2 with the IV iron.   no overt blood loss or GI bleeding noted. No overt hematuria.  MEDICAL HISTORY:  Past Medical History:  Diagnosis Date  . Cerebral embolism with cerebral infarction ([place])    residual left sided weakness/notes 12/12/2013  .  Chronic kidney disease (CKD), stage II (mild)    [name] 12/12/2013  . Colon polyps 10/20/2013   TUBULAR ADENOMA (4).  . COPD 05/18/2007  . Elevated PSA 05/25/2007  . Hypertension 05/18/2007  . Prostate cancer ([place])   . Protein-calorie malnutrition, severe ([place])    [name] 12/12/2013  . Seizures ([place]) 12/05/2013; 12/08/2013; 12/11/2013   new onset; recurrent/notes 12/12/2013  . Stroke ([place]) 10/14/2010, 11/2012   Right centrum semiovale    SURGICAL HISTORY: Past Surgical History:  Procedure Laterality Date  . COLONOSCOPY W/ BIOPSIES  2015   4 polyps, tubular adenomas, pan diverticulosis, one lipoma.   . ESOPHAGOGASTRODUODENOSCOPY N/A 06/06/2014   Procedure: ESOPHAGOGASTRODUODENOSCOPY (EGD);  Surgeon: Gatha Mayer, MD;  Location: [facility] ENDOSCOPY;  Service: Endoscopy;  Laterality: N/A;  . MULTIPLE EXTRACTIONS WITH ALVEOLOPLASTY N/A 10/23/2013   Procedure: Extraction of tooth #'s 1,2,3,4,5,6,7,11,12,13,14,15,16,20,21,22,23,24,27,28,29,32 with alveoloplasty and bilateral maxillary tuberosity reductions;  Surgeon: Lenn Cal, DDS;  Location: [facility];  Service: Oral Surgery;  Laterality: N/A;  . PEG TUBE PLACEMENT  12/2012   per IR  . TIBIA FRACTURE SURGERY Right    pin  . TOE AMPUTATION Right 2000s   Gangrene  . TONSILLECTOMY      SOCIAL HISTORY: Social History   Social History  . Marital status: Single    Spouse name: N/A  . Number of children: 0  . Years of education: N/A   Occupational  History  . Flooring instalation [employer]    Retired   Social History Main Topics  . Smoking status: Current Some Day Smoker    Packs/day: 0.10    Years: 50.00    Types: Cigarettes  . Smokeless tobacco: Never Used     Comment: 1/2 ppd hx, now 3-4 cigs/day. Form given 05-14-14  . Alcohol use 0.0 oz/week  . Drug use: No     Comment: history of cocaine use  . Sexual activity: Not Currently   Other Topics Concern  . Not on file   Social History Narrative   Used to drink 1/2 pt of wine  daily and beer daily (at least 12 oz).  as of 06/03/14 denies recent ETOH.    Denies recreational drugs but tox screen + for cocaine in past.     Smokes 3-4 cigarettes. 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    2 years of college at [employer] per patient                 FAMILY HISTORY: Family History  Problem Relation Age of Onset  . Liver disease Mother   . Heart attack Mother 14  . Colon cancer Neg Hx     ALLERGIES:  is allergic to aspirin; penicillins; and other.  MEDICATIONS:  Current Outpatient Prescriptions  Medication Sig Dispense Refill  . albuterol (PROVENTIL HFA;VENTOLIN HFA) 108 (90 BASE) MCG/ACT inhaler Inhale 2 puffs into the lungs every 6 (six) hours as needed for wheezing. 8.5 g 3  . atorvastatin (LIPITOR) 80 MG tablet Take 80 mg by mouth at bedtime.    . chlorhexidine (PERIDEX) 0.12 % solution Use as directed 15 mLs in the mouth or throat daily.    . clopidogrel (PLAVIX) 75 MG tablet Take 1 tablet (75 mg total) by mouth daily with breakfast. 90 tablet 1  . diclofenac sodium (VOLTAREN) 1 % GEL Apply 1 application topically 3 (three) times daily as needed (knee pain).    . ferrous sulfate 325 (65 FE) MG tablet Take 1 tablet (325 mg total) by mouth 2 (two) times daily with a meal. 30 tablet 5  . folic acid (FOLVITE) 1 MG tablet Take 1 tablet (1 mg total) by mouth daily. 30 tablet 5  . levETIRAcetam (KEPPRA) 500 MG tablet Take 1,500 mg by mouth 2 (two) times daily.    . Multiple Vitamin (MULTIVITAMIN WITH MINERALS) TABS tablet Take 1 tablet by mouth daily.    . pantoprazole (PROTONIX) 40 MG tablet Take 1 tablet (40 mg total) by mouth daily. 90 tablet 1  . senna-docusate (SENOKOT-S) 8.6-50 MG per tablet Take 1 tablet by mouth at bedtime as needed for mild constipation. 30 tablet 2  . tiotropium (SPIRIVA HANDIHALER) 18 MCG inhalation capsule Place 1 capsule (18 mcg total) into inhaler and inhale daily. 90 capsule 4   No  current facility-administered medications for this visit.     REVIEW OF SYSTEMS:    10 Point review of Systems was done is negative except as noted above.  PHYSICAL EXAMINATION: ECOG PERFORMANCE STATUS: 3 - Symptomatic, >50% confined to bed  . Vitals:   02/20/16 1131  BP: (!) 168/76  Pulse: 69  Resp: 18  Temp: 98 F (36.7 C)   Filed Weights   .There is no height or weight on file to calculate BMI.  GENERAL:alert, in no acute distress and comfortable sitting in wheelchair. Speech somewhat difficult to understand  due to CVA SKIN: skin color, texture, turgor are normal, no rashes or significant lesions EYES: normal, conjunctiva are pink and non-injected, sclera clear OROPHARYNX:no exudate, no erythema and lips, buccal mucosa, and tongue normal  NECK: supple, no JVD, thyroid normal size, non-tender, without nodularity LYMPH:  no palpable lymphadenopathy in the cervical, axillary or inguinal LUNGS: clear to auscultation with normal respiratory effort HEART: regular rate & rhythm,  no murmurs and no lower extremity edema ABDOMEN: abdomen soft, non-tender, normoactive bowel sounds  PSYCH: alert & oriented x 3 with speech that if somewhat altered and difficult to understand but understandable with attention. NEURO: left sided weakness - detailed neuro examination not done  LABORATORY DATA:  I have reviewed the data as listed  . CBC Latest Ref Rng & Units 02/20/2016 01/23/2016 07/19/2014  WBC 4.0 - 10.3 10e3/uL 5.0 6.1 8.8  Hemoglobin 13.0 - 17.1 g/dL 10.2(L) 8.3(L) 7.3(L)  Hematocrit 38.4 - 49.9 % 33.8(L) 29.0(L) 22.7(L)  Platelets 140 - 400 10e3/uL 227 236 236    .Marland Kitchen CBC    Component Value Date/Time   WBC 5.0 02/20/2016 1118   WBC 8.8 07/19/2014 0400   RBC 4.16 (L) 02/20/2016 1118   RBC 3.01 (L) 07/19/2014 0400   HGB 10.2 (L) 02/20/2016 1118   HCT 33.8 (L) 02/20/2016 1118   PLT 227 02/20/2016 1118   MCV 81.4 02/20/2016 1118   MCH 24.6 (L) 02/20/2016 1118   MCH 24.3  (L) 07/19/2014 0400   MCHC 30.2 (L) 02/20/2016 1118   MCHC 32.2 07/19/2014 0400   RDW 23.0 (H) 02/20/2016 1118   LYMPHSABS 1.4 02/20/2016 1118   MONOABS 0.6 02/20/2016 1118   EOSABS 0.1 02/20/2016 1118   BASOSABS 0.0 02/20/2016 1118     CMP Latest Ref Rng & Units 02/20/2016 01/23/2016 01/23/2016  Glucose 70 - 140 mg/dl 91 95 -  BUN 7.0 - 26.0 mg/dL 7.5 10.9 -  Creatinine 0.7 - 1.3 mg/dL 0.9 1.0 -  Sodium 136 - 145 mEq/L 139 139 -  Potassium 3.5 - 5.1 mEq/L 4.1 4.3 -  Chloride 96 - 112 mEq/L - - -  CO2 22 - 29 mEq/L 30(H) 31(H) -  Calcium 8.4 - 10.4 mg/dL 9.5 9.9 -  Total Protein 6.4 - 8.3 g/dL 8.2 8.4(H) 8.0  Total Bilirubin 0.20 - 1.20 mg/dL <0.30 <0.30 -  Alkaline Phos 40 - 150 U/L 74 72 -  AST 5 - 34 U/L 23 24 -  ALT 0 - 55 U/L 24 25 -   Component     Latest Ref Rng 01/23/2016  IgG (Immunoglobin G), Serum     700 - 1600 mg/dL 2,715 (H)  IgA/Immunoglobulin A, Serum     61 - 437 mg/dL 278  IgM, Qn, Serum     20 - 172 mg/dL 102  Total Protein     6.0 - 8.5 g/dL 8.0  Albumin SerPl Elph-Mcnc     2.9 - 4.4 g/dL 3.7  Alpha 1     0.0 - 0.4 g/dL 0.2  Alpha2 Glob SerPl Elph-Mcnc     0.4 - 1.0 g/dL 0.6  B-Globulin SerPl Elph-Mcnc     0.7 - 1.3 g/dL 1.1  Gamma Glob SerPl Elph-Mcnc     0.4 - 1.8 g/dL 2.4 (H)  M Protein SerPl Elph-Mcnc     Not Observed g/dL 0.8 (H)  Globulin, Total     2.2 - 3.9 g/dL 4.3 (H)  Albumin/Glob SerPl     0.7 - 1.7 0.9  IFE  1      Comment  Please Note (HCV):      Comment  Iron     42 - 163 ug/dL 50  TIBC     202 - 409 ug/dL 401  UIBC     117 - 376 ug/dL 351  %SAT     20 - 55 % 13 (L)  Ferritin     22 - 316 ng/ml 18 (L)  PSA     0.0 - 4.0 ng/mL 1.7  Vitamin B12     211 - 946 pg/mL 853   Comments: Immunofixation shows IgG monoclonal protein with lambda light chain  Specificity.  . Lab Results  Component Value Date   IRON 69 02/20/2016   TIBC 294 02/20/2016   IRONPCTSAT 23 02/20/2016   (Iron and TIBC)  Lab Results  Component  Value Date   FERRITIN 514 (H) 02/20/2016    RADIOGRAPHIC STUDIES: I have personally reviewed the radiological images as listed and agreed with the findings in the report. No results found.  ASSESSMENT & PLAN:   65 yo with multiple medical co-morbids and poor functional status due to CVA with left sided weakness with   1) Normocytic Normochromic Anemia - likely multifactorial Noted to have significant Iron deficiency despite being on oral iron replacement. Unclear source of bleeding. patient received IV Feraheme and his ferritin is within normal limits. 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 Less likely Myeloma   PLAN -No indication for additional IV iron at this time. Patient has adequate iron stores at this time. -The goal is to keep his ferritin >100 -if still anemic with hgb < 10 might need to consider ESA's for his CKD. (We will hold off at this time given good response to iron) -rpt SPEP in 6 months to monitor M protein with PCP. -prn PRBC transfusion for symptomatic anemia -Given his overall condition and goals of care will hold off on additional workup for his IgG lambda MGUS at this time.  3) Prostate cancer - unclear details. Patient reports he is currently on lupron as per [facility] Plan -continue f/u with [facility]. -Kindly consult [place] if any questions arise on this front.  RTC to for continued cares at the nursing home with Dr. Benita Stabile. RTC with Dr Irene Limbo on an as needed basis if new questions/concerns arise.  All of the patients questions were answered with apparent satisfaction. The patient knows to call the clinic with any problems, questions or concerns.  I spent 20 minutes counseling the patient face to face. The total time spent in the appointment was 25 minutes and more than 50% was  on counseling and direct patient cares.    Sullivan Lone MD [place] AAHIVMS [facility] Hematology/Oncology Physician [facility]  (Office):       [phone] (Work cell):  [phone] (Fax):           [phone]

## 2016-07-07 ENCOUNTER — Emergency Department (HOSPITAL_COMMUNITY): Payer: Medicare Other

## 2016-07-07 ENCOUNTER — Encounter (HOSPITAL_COMMUNITY): Payer: Self-pay

## 2016-07-07 ENCOUNTER — Inpatient Hospital Stay (HOSPITAL_COMMUNITY)
Admission: EM | Admit: 2016-07-07 | Discharge: 2016-07-13 | DRG: 871 | Disposition: A | Discharge status: Skilled Nursing Facility | Payer: Medicare Other | Attending: Internal Medicine | Admitting: Internal Medicine

## 2016-07-07 DIAGNOSIS — D500 Iron deficiency anemia secondary to blood loss (chronic): Secondary | ICD-10-CM | POA: Diagnosis present

## 2016-07-07 DIAGNOSIS — E785 Hyperlipidemia, unspecified: Secondary | ICD-10-CM | POA: Diagnosis present

## 2016-07-07 DIAGNOSIS — Z888 Allergy status to other drugs, medicaments and biological substances status: Secondary | ICD-10-CM | POA: Diagnosis not present

## 2016-07-07 DIAGNOSIS — J189 Pneumonia, unspecified organism: Secondary | ICD-10-CM | POA: Diagnosis present

## 2016-07-07 DIAGNOSIS — D472 Monoclonal gammopathy: Secondary | ICD-10-CM | POA: Diagnosis present

## 2016-07-07 DIAGNOSIS — N182 Chronic kidney disease, stage 2 (mild): Secondary | ICD-10-CM | POA: Diagnosis present

## 2016-07-07 DIAGNOSIS — Z880 Allergy status to penicillin: Secondary | ICD-10-CM | POA: Diagnosis not present

## 2016-07-07 DIAGNOSIS — G40909 Epilepsy, unspecified, not intractable, without status epilepticus: Secondary | ICD-10-CM | POA: Diagnosis present

## 2016-07-07 DIAGNOSIS — F17210 Nicotine dependence, cigarettes, uncomplicated: Secondary | ICD-10-CM | POA: Diagnosis present

## 2016-07-07 DIAGNOSIS — Z8673 Personal history of transient ischemic attack (TIA), and cerebral infarction without residual deficits: Secondary | ICD-10-CM

## 2016-07-07 DIAGNOSIS — Z7902 Long term (current) use of antithrombotics/antiplatelets: Secondary | ICD-10-CM

## 2016-07-07 DIAGNOSIS — E860 Dehydration: Secondary | ICD-10-CM | POA: Diagnosis present

## 2016-07-07 DIAGNOSIS — J441 Chronic obstructive pulmonary disease with (acute) exacerbation: Secondary | ICD-10-CM | POA: Diagnosis present

## 2016-07-07 DIAGNOSIS — I10 Essential (primary) hypertension: Secondary | ICD-10-CM | POA: Diagnosis not present

## 2016-07-07 DIAGNOSIS — Z23 Encounter for immunization: Secondary | ICD-10-CM

## 2016-07-07 DIAGNOSIS — E876 Hypokalemia: Secondary | ICD-10-CM | POA: Diagnosis present

## 2016-07-07 DIAGNOSIS — N179 Acute kidney failure, unspecified: Secondary | ICD-10-CM | POA: Diagnosis present

## 2016-07-07 DIAGNOSIS — A021 Salmonella sepsis: Secondary | ICD-10-CM | POA: Diagnosis not present

## 2016-07-07 DIAGNOSIS — A09 Infectious gastroenteritis and colitis, unspecified: Secondary | ICD-10-CM | POA: Diagnosis present

## 2016-07-07 DIAGNOSIS — R7881 Bacteremia: Secondary | ICD-10-CM | POA: Diagnosis not present

## 2016-07-07 DIAGNOSIS — Y95 Nosocomial condition: Secondary | ICD-10-CM | POA: Diagnosis present

## 2016-07-07 DIAGNOSIS — I129 Hypertensive chronic kidney disease with stage 1 through stage 4 chronic kidney disease, or unspecified chronic kidney disease: Secondary | ICD-10-CM | POA: Diagnosis present

## 2016-07-07 DIAGNOSIS — A419 Sepsis, unspecified organism: Secondary | ICD-10-CM | POA: Diagnosis present

## 2016-07-07 DIAGNOSIS — C7951 Secondary malignant neoplasm of bone: Secondary | ICD-10-CM | POA: Diagnosis present

## 2016-07-07 DIAGNOSIS — R339 Retention of urine, unspecified: Secondary | ICD-10-CM | POA: Diagnosis present

## 2016-07-07 DIAGNOSIS — Z66 Do not resuscitate: Secondary | ICD-10-CM | POA: Diagnosis present

## 2016-07-07 DIAGNOSIS — R197 Diarrhea, unspecified: Secondary | ICD-10-CM | POA: Diagnosis present

## 2016-07-07 DIAGNOSIS — Z8546 Personal history of malignant neoplasm of prostate: Secondary | ICD-10-CM

## 2016-07-07 DIAGNOSIS — J440 Chronic obstructive pulmonary disease with acute lower respiratory infection: Secondary | ICD-10-CM | POA: Diagnosis present

## 2016-07-07 DIAGNOSIS — A029 Salmonella infection, unspecified: Secondary | ICD-10-CM | POA: Diagnosis not present

## 2016-07-07 DIAGNOSIS — N189 Chronic kidney disease, unspecified: Secondary | ICD-10-CM

## 2016-07-07 DIAGNOSIS — J181 Lobar pneumonia, unspecified organism: Secondary | ICD-10-CM | POA: Diagnosis not present

## 2016-07-07 DIAGNOSIS — I959 Hypotension, unspecified: Secondary | ICD-10-CM | POA: Diagnosis present

## 2016-07-07 DIAGNOSIS — N170 Acute kidney failure with tubular necrosis: Secondary | ICD-10-CM | POA: Diagnosis not present

## 2016-07-07 DIAGNOSIS — Z79899 Other long term (current) drug therapy: Secondary | ICD-10-CM

## 2016-07-07 DIAGNOSIS — C61 Malignant neoplasm of prostate: Secondary | ICD-10-CM | POA: Diagnosis not present

## 2016-07-07 HISTORY — DX: Hypotension, unspecified: I95.9

## 2016-07-07 HISTORY — DX: Pure hypercholesterolemia, unspecified: E78.00

## 2016-07-07 HISTORY — DX: Pneumonia, unspecified organism: J18.9

## 2016-07-07 HISTORY — DX: Sepsis, unspecified organism: A41.9

## 2016-07-07 LAB — CBC WITH DIFFERENTIAL/PLATELET
BASOS ABS: 0 10*3/uL (ref 0.0–0.1)
BASOS PCT: 0 %
Eosinophils Absolute: 0 10*3/uL (ref 0.0–0.7)
Eosinophils Relative: 0 %
HCT: 27.9 % — ABNORMAL LOW (ref 39.0–52.0)
HEMOGLOBIN: 8.5 g/dL — AB (ref 13.0–17.0)
LYMPHS PCT: 10 %
Lymphs Abs: 1.1 10*3/uL (ref 0.7–4.0)
MCH: 23.6 pg — ABNORMAL LOW (ref 26.0–34.0)
MCHC: 30.5 g/dL (ref 30.0–36.0)
MCV: 77.5 fL — ABNORMAL LOW (ref 78.0–100.0)
MONOS PCT: 8 %
Monocytes Absolute: 0.9 10*3/uL (ref 0.1–1.0)
NEUTROS ABS: 9 10*3/uL — AB (ref 1.7–7.7)
Neutrophils Relative %: 82 %
PLATELETS: 353 10*3/uL (ref 150–400)
RBC: 3.6 MIL/uL — AB (ref 4.22–5.81)
RDW: 15.5 % (ref 11.5–15.5)
WBC: 11 10*3/uL — ABNORMAL HIGH (ref 4.0–10.5)

## 2016-07-07 LAB — I-STAT ARTERIAL BLOOD GAS, ED
Acid-base deficit: 8 mmol/L — ABNORMAL HIGH (ref 0.0–2.0)
Bicarbonate: 18 mmol/L — ABNORMAL LOW (ref 20.0–28.0)
O2 Saturation: 90 %
PCO2 ART: 39.8 mmHg (ref 32.0–48.0)
PH ART: 7.271 — AB (ref 7.350–7.450)
PO2 ART: 71 mmHg — AB (ref 83.0–108.0)
Patient temperature: 101.1
TCO2: 19 mmol/L (ref 0–100)

## 2016-07-07 LAB — URINALYSIS, ROUTINE W REFLEX MICROSCOPIC
Glucose, UA: NEGATIVE mg/dL
Ketones, ur: 15 mg/dL — AB
NITRITE: NEGATIVE
PROTEIN: 100 mg/dL — AB
SPECIFIC GRAVITY, URINE: 1.021 (ref 1.005–1.030)
pH: 5 (ref 5.0–8.0)

## 2016-07-07 LAB — COMPREHENSIVE METABOLIC PANEL
ALBUMIN: 3.3 g/dL — AB (ref 3.5–5.0)
ALK PHOS: 41 U/L (ref 38–126)
ALT: 31 U/L (ref 17–63)
ANION GAP: 15 (ref 5–15)
AST: 52 U/L — AB (ref 15–41)
BUN: 56 mg/dL — AB (ref 6–20)
CO2: 19 mmol/L — AB (ref 22–32)
Calcium: 9.2 mg/dL (ref 8.9–10.3)
Chloride: 102 mmol/L (ref 101–111)
Creatinine, Ser: 7 mg/dL — ABNORMAL HIGH (ref 0.61–1.24)
GFR calc Af Amer: 8 mL/min — ABNORMAL LOW (ref >60)
GFR calc non Af Amer: 7 mL/min — ABNORMAL LOW (ref >60)
GLUCOSE: 122 mg/dL — AB (ref 65–99)
POTASSIUM: 3.6 mmol/L (ref 3.5–5.1)
SODIUM: 136 mmol/L (ref 135–145)
Total Bilirubin: 0.2 mg/dL — ABNORMAL LOW (ref 0.3–1.2)
Total Protein: 7.9 g/dL (ref 6.5–8.1)

## 2016-07-07 LAB — URINE MICROSCOPIC-ADD ON

## 2016-07-07 LAB — I-STAT CG4 LACTIC ACID, ED
Lactic Acid, Venous: 1.63 mmol/L (ref 0.5–1.9)
Lactic Acid, Venous: 1.63 mmol/L (ref 0.5–1.9)

## 2016-07-07 MED ORDER — SODIUM CHLORIDE 0.9 % IV BOLUS (SEPSIS)
1000.0000 mL | Freq: Once | INTRAVENOUS | Status: AC
  Administered 2016-07-07: 1000 mL via INTRAVENOUS

## 2016-07-07 MED ORDER — VANCOMYCIN HCL IN DEXTROSE 1-5 GM/200ML-% IV SOLN
1000.0000 mg | Freq: Once | INTRAVENOUS | Status: AC
  Administered 2016-07-07: 1000 mg via INTRAVENOUS
  Filled 2016-07-07: qty 200

## 2016-07-07 MED ORDER — FOLIC ACID 1 MG PO TABS
1.0000 mg | ORAL_TABLET | Freq: Every day | ORAL | Status: DC
  Administered 2016-07-08 – 2016-07-13 (×6): 1 mg via ORAL
  Filled 2016-07-07 (×6): qty 1

## 2016-07-07 MED ORDER — ATORVASTATIN CALCIUM 10 MG PO TABS
10.0000 mg | ORAL_TABLET | Freq: Every day | ORAL | Status: DC
  Administered 2016-07-08 – 2016-07-12 (×5): 10 mg via ORAL
  Filled 2016-07-07 (×5): qty 1

## 2016-07-07 MED ORDER — ALBUTEROL SULFATE (2.5 MG/3ML) 0.083% IN NEBU: 2.5000 mg | INHALATION_SOLUTION | Freq: Four times a day (QID) | RESPIRATORY_TRACT | Status: DC | PRN

## 2016-07-07 MED ORDER — CLOPIDOGREL BISULFATE 75 MG PO TABS
75.0000 mg | ORAL_TABLET | Freq: Every day | ORAL | Status: DC
  Administered 2016-07-08 – 2016-07-13 (×6): 75 mg via ORAL
  Filled 2016-07-07 (×6): qty 1

## 2016-07-07 MED ORDER — CALCIUM CARBONATE-VITAMIN D 500-200 MG-UNIT PO TABS
1.0000 | ORAL_TABLET | Freq: Two times a day (BID) | ORAL | Status: DC
  Administered 2016-07-08 – 2016-07-13 (×10): 1 via ORAL
  Filled 2016-07-07 (×13): qty 1

## 2016-07-07 MED ORDER — DEXTROSE 5 % IV SOLN
2.0000 g | Freq: Once | INTRAVENOUS | Status: AC
  Administered 2016-07-07: 2 g via INTRAVENOUS
  Filled 2016-07-07: qty 2

## 2016-07-07 MED ORDER — CHLORHEXIDINE GLUCONATE 0.12 % MT SOLN
15.0000 mL | Freq: Every day | OROMUCOSAL | Status: DC
  Administered 2016-07-08 – 2016-07-13 (×6): 15 mL via OROMUCOSAL
  Filled 2016-07-07 (×7): qty 15

## 2016-07-07 MED ORDER — TIOTROPIUM BROMIDE MONOHYDRATE 18 MCG IN CAPS
18.0000 ug | ORAL_CAPSULE | Freq: Every day | RESPIRATORY_TRACT | Status: DC
  Administered 2016-07-08 – 2016-07-13 (×6): 18 ug via RESPIRATORY_TRACT
  Filled 2016-07-07 (×2): qty 5

## 2016-07-07 MED ORDER — DEXTROSE 5 % IV SOLN
500.0000 mg | INTRAVENOUS | Status: DC
  Administered 2016-07-08 – 2016-07-09 (×2): 500 mg via INTRAVENOUS
  Filled 2016-07-07 (×3): qty 0.5

## 2016-07-07 MED ORDER — SODIUM CHLORIDE 0.9 % IV SOLN
INTRAVENOUS | Status: DC
  Administered 2016-07-07 – 2016-07-10 (×5): via INTRAVENOUS

## 2016-07-07 MED ORDER — VANCOMYCIN HCL IN DEXTROSE 1-5 GM/200ML-% IV SOLN
1000.0000 mg | INTRAVENOUS | Status: DC
  Filled 2016-07-07: qty 200

## 2016-07-07 MED ORDER — LEVETIRACETAM 750 MG PO TABS
1500.0000 mg | ORAL_TABLET | Freq: Two times a day (BID) | ORAL | Status: DC
  Administered 2016-07-08 – 2016-07-13 (×12): 1500 mg via ORAL
  Filled 2016-07-07 (×8): qty 2
  Filled 2016-07-07: qty 3
  Filled 2016-07-07: qty 2
  Filled 2016-07-07: qty 3
  Filled 2016-07-07: qty 2

## 2016-07-07 MED ORDER — CEFEPIME HCL 2 G IJ SOLR: 2.0000 g | Freq: Once | INTRAMUSCULAR | Status: DC

## 2016-07-07 MED ORDER — ADULT MULTIVITAMIN W/MINERALS CH
1.0000 | ORAL_TABLET | Freq: Every day | ORAL | Status: DC
  Administered 2016-07-08 – 2016-07-13 (×6): 1 via ORAL
  Filled 2016-07-07 (×6): qty 1

## 2016-07-07 MED ORDER — DEXTROSE 5 % IV SOLN
2.0000 g | Freq: Once | INTRAVENOUS | Status: DC
  Filled 2016-07-07: qty 2

## 2016-07-07 MED ORDER — PANTOPRAZOLE SODIUM 40 MG PO TBEC
40.0000 mg | DELAYED_RELEASE_TABLET | Freq: Every day | ORAL | Status: DC
  Administered 2016-07-08 – 2016-07-13 (×6): 40 mg via ORAL
  Filled 2016-07-07 (×6): qty 1

## 2016-07-07 NOTE — ED Provider Notes (Signed)
[facility] DEPT Provider Note   CSN: [id number] Arrival date & time: 07/07/16  1949     History   Chief Complaint Chief Complaint  Patient presents with  . Code Sepsis    HPI [name] is a 65 y.o. male. He presents with fever and hypotension from his skilled nursing facility  HPI:  Patient has history of previous stroke. He resides at a skilled nursing facility. He has had decreased activity, and then less communicative for the last 24 hours. Noted to be febrile at a low pressure of 70 systolic tonight.  He has had a cough. No reported about his by mouth intake however his mouth is quite dry on arrival. No report of vomiting or aspiration events.  Past Medical History:  Diagnosis Date  . Cerebral embolism with cerebral infarction ([place])    residual left sided weakness/notes 12/12/2013  . Chronic kidney disease (CKD), stage II (mild)    [name] 12/12/2013  . Colon polyps 10/20/2013   TUBULAR ADENOMA (4).  . COPD 05/18/2007  . Elevated PSA 05/25/2007  . Hypertension 05/18/2007  . Prostate cancer ([place])   . Protein-calorie malnutrition, severe ([place])    [name] 12/12/2013  . Seizures ([place]) 12/05/2013; 12/08/2013; 12/11/2013   new onset; recurrent/notes 12/12/2013  . Stroke ([place]) 10/14/2010, 11/2012   Right centrum semiovale    Patient Active Problem List   Diagnosis Date Noted  . Iron deficiency anemia due to chronic blood loss 01/23/2016  . Anemia in neoplastic disease 01/23/2016  . Prostate cancer metastatic to bone ([place]) 01/23/2016  . Weakness   . Palliative care encounter 07/17/2014  . Decreased appetite 07/17/2014  . Increased anion gap metabolic acidosis [id number]  . Acute on chronic renal failure ([employer]) 07/16/2014  . Duodenitis 06/06/2014  . Seizure ([place]) 06/03/2014  . GI bleeding 06/03/2014  . Acute blood loss anemia 06/03/2014  . Occult blood in stools 05/14/2014  . Physical deconditioning 02/07/2014  . Anemia, iron deficiency 12/13/2013  . Anemia, chronic  disease 12/12/2013  . Protein-calorie malnutrition, severe ([place]) 12/04/2013  . Seizures ([place]) 12/02/2013  . Acute-on-chronic kidney injury ([place]) 12/02/2013  . Left-sided weakness, residual s/p cva 11/24/2013  . CVA (cerebral vascular accident) ([place]) 11/23/2013  . Acute ischemic stroke ([place]) 11/02/2013  . Generalized weakness 10/24/2013  . Aftercare following surgery of teeth, oral cavity or digestive system 10/23/2013  . Routine health maintenance 10/10/2013  . Prostate cancer ([place]) 06/19/2013  . Localized swelling, mass, or lump of lower extremity 04/21/2013  . Stroke, acute, embolic ([place]) [id number]  . Cerebral embolism with cerebral infarction ([place]) 12/05/2012  . Dyslipidemia 12/05/2012  . Alcohol abuse 12/05/2012  . Claudication, intermittent ([place]) 11/07/2012  . Tobacco abuse 10/18/2012  . History of stroke 10/14/2010  . Elevated PSA 05/25/2007  . Hypertension 05/18/2007  . COPD 05/18/2007    Past Surgical History:  Procedure Laterality Date  . COLONOSCOPY W/ BIOPSIES  2015   4 polyps, tubular adenomas, pan diverticulosis, one lipoma.   . ESOPHAGOGASTRODUODENOSCOPY N/A 06/06/2014   Procedure: ESOPHAGOGASTRODUODENOSCOPY (EGD);  Surgeon: Gatha Mayer, MD;  Location: [facility] ENDOSCOPY;  Service: Endoscopy;  Laterality: N/A;  . MULTIPLE EXTRACTIONS WITH ALVEOLOPLASTY N/A 10/23/2013   Procedure: Extraction of tooth #'s 1,2,3,4,5,6,7,11,12,13,14,15,16,20,21,22,23,24,27,28,29,32 with alveoloplasty and bilateral maxillary tuberosity reductions;  Surgeon: Lenn Cal, DDS;  Location: [facility];  Service: Oral Surgery;  Laterality: N/A;  . PEG TUBE PLACEMENT  12/2012   per IR  . TIBIA FRACTURE SURGERY Right    pin  .  TOE AMPUTATION Right 2000s   Gangrene  . TONSILLECTOMY         Home Medications    Prior to Admission medications   Medication Sig Start Date End Date Taking? Authorizing Provider  albuterol (PROVENTIL HFA;VENTOLIN HFA) 108 (90 BASE) MCG/ACT inhaler Inhale 2 puffs  into the lungs every 6 (six) hours as needed for wheezing. 10/10/13  Yes Corky Sox, MD  atorvastatin (LIPITOR) 10 MG tablet Take 10 mg by mouth daily at 6 PM.   Yes Historical Provider, MD  Calcium Carbonate-Vitamin D (CALTRATE 600+D) 600-400 MG-UNIT tablet Take 1 tablet by mouth 2 (two) times daily.   Yes Historical Provider, MD  chlorhexidine (PERIDEX) 0.12 % solution Use as directed 15 mLs in the mouth or throat daily.   Yes Historical Provider, MD  cloNIDine (CATAPRES) 0.1 MG tablet Take 0.1 mg by mouth 2 (two) times daily as needed (for SBP >160 or DBP >95).   Yes Historical Provider, MD  clopidogrel (PLAVIX) 75 MG tablet Take 1 tablet (75 mg total) by mouth daily with breakfast. 06/28/14  Yes Corky Sox, MD  folic acid (FOLVITE) 1 MG tablet Take 1 tablet (1 mg total) by mouth daily. 07/24/14  Yes Corky Sox, MD  levETIRAcetam (KEPPRA) 500 MG tablet Take 1,500 mg by mouth 2 (two) times daily.   Yes Historical Provider, MD  lisinopril-hydrochlorothiazide (PRINZIDE,ZESTORETIC) 20-25 MG tablet Take 1 tablet by mouth daily.   Yes Historical Provider, MD  Multiple Vitamin (MULTIVITAMIN WITH MINERALS) TABS tablet Take 1 tablet by mouth daily.   Yes Historical Provider, MD  OVER THE COUNTER MEDICATION Take 1 Container by mouth 3 (three) times daily. Magic cup   Yes Historical Provider, MD  pantoprazole (PROTONIX) 40 MG tablet Take 1 tablet (40 mg total) by mouth daily. 06/28/14  Yes Corky Sox, MD  tiotropium (SPIRIVA HANDIHALER) 18 MCG inhalation capsule Place 1 capsule (18 mcg total) into inhaler and inhale daily. 04/04/14 07/07/16 Yes Corky Sox, MD    Family History Family History  Problem Relation Age of Onset  . Liver disease Mother   . Heart attack Mother 85  . Colon cancer Neg Hx     Social History Social History  Substance Use Topics  . Smoking status: Current Some Day Smoker    Packs/day: 0.10    Years: 50.00    Types: Cigarettes  . Smokeless tobacco: Never Used      Comment: 1/2 ppd hx, now 3-4 cigs/day. Form given 05-14-14  . Alcohol use 0.0 oz/week     Allergies   Other; Aspirin; and Penicillins   Review of Systems Review of Systems  Unable to perform ROS: Mental status change   5 caveat. Patient is able to speak a few words at a time. He is able to follow simple commands only   Physical Exam Updated Vital Signs BP 96/65   Pulse 105   Temp 101.1 F (38.4 C) (Rectal)   Resp 25   Ht 5\' 8"  (1.727 m)   Wt 140 lb (63.5 kg)   SpO2 (!) 87%   BMI 21.29 kg/m   Physical Exam  Constitutional: He is oriented to person, place, and time. No distress.  Adult male. Can state his first and last name. Difficult to understand because his mucous membranes of his mouth are quite dry and leathery.  HENT:  Head: Normocephalic.  Dr. not pale. Driving his membranes. No JVD.  Eyes: Conjunctivae are normal. Pupils are equal, round, and  reactive to light. No scleral icterus.  Neck: Normal range of motion. Neck supple. No thyromegaly present.  Cardiovascular: Normal rate and regular rhythm.  Exam reveals no gallop and no friction rub.   No murmur heard. Sinus tachycardia rate 105.  Pulmonary/Chest: Effort normal and breath sounds normal. No respiratory distress. He has no wheezes. He has no rales.  Calculus by basilar breath sounds. No increased work of breathing. Saturating 95% on room air  Abdominal: Soft. Bowel sounds are normal. He exhibits no distension. There is no tenderness. There is no rebound.  Musculoskeletal: Normal range of motion.  Neurological: He is alert and oriented to person, place, and time.   He can grip bilaterally to command. Can lift each leg and inability to command. Global weakness but nonfocal  Skin: Skin is warm and dry. No rash noted.  Psychiatric: He has a normal mood and affect. His behavior is normal.     ED Treatments / Results  Labs (all labs ordered are listed, but only abnormal results are displayed) Labs Reviewed    COMPREHENSIVE METABOLIC PANEL - Abnormal; Notable for the following:       Result Value   CO2 19 (*)    Glucose, Bld 122 (*)    BUN 56 (*)    Creatinine, Ser 7.00 (*)    Albumin 3.3 (*)    AST 52 (*)    Total Bilirubin 0.2 (*)    GFR calc non Af Amer 7 (*)    GFR calc Af Amer 8 (*)    All other components within normal limits  CBC WITH DIFFERENTIAL/PLATELET - Abnormal; Notable for the following:    WBC 11.0 (*)    RBC 3.60 (*)    Hemoglobin 8.5 (*)    HCT 27.9 (*)    MCV 77.5 (*)    MCH 23.6 (*)    Neutro Abs 9.0 (*)    All other components within normal limits  URINALYSIS, ROUTINE W REFLEX MICROSCOPIC (NOT AT [facility]) - Abnormal; Notable for the following:    Color, Urine AMBER (*)    APPearance TURBID (*)    Hgb urine dipstick LARGE (*)    Bilirubin Urine SMALL (*)    Ketones, ur 15 (*)    Protein, ur 100 (*)    Leukocytes, UA LARGE (*)    All other components within normal limits  URINE MICROSCOPIC-ADD ON - Abnormal; Notable for the following:    Squamous Epithelial / LPF 6-30 (*)    Bacteria, UA MANY (*)    Casts HYALINE CASTS (*)    All other components within normal limits  I-STAT ARTERIAL BLOOD GAS, ED - Abnormal; Notable for the following:    pH, Arterial 7.271 (*)    pO2, Arterial 71.0 (*)    Bicarbonate 18.0 (*)    Acid-base deficit 8.0 (*)    All other components within normal limits  CULTURE, BLOOD (ROUTINE X 2)  CULTURE, BLOOD (ROUTINE X 2)  URINE CULTURE  BLOOD GAS, ARTERIAL  I-STAT CG4 LACTIC ACID, ED  I-STAT CG4 LACTIC ACID, ED    EKG  EKG Interpretation None       Radiology Dg Chest Port 1 View  Result Date: 07/07/2016 CLINICAL DATA:  Shortness of breath and sepsis EXAM: PORTABLE CHEST 1 VIEW COMPARISON:  Chest CT 07/16/2014 FINDINGS: Examination is degraded by patient motion. There is atherosclerotic calcification within the aortic arch. Cardiomediastinal contours are otherwise unremarkable. There are bibasilar opacities, right greater than  left. No  pneumothorax or sizable pleural effusion. IMPRESSION: Motion degraded examination. Within that limitation, there are bibasilar opacities, right worse than left, which may indicate atelectasis or developing consolidation. Aortic atherosclerosis. Electronically Signed   By: Ulyses Jarred M.D.   On: 07/07/2016 22:02    Procedures Procedures (including critical care time)  Medications Ordered in ED Medications  ceFEPIme (MAXIPIME) 500 mg in dextrose 5 % 50 mL IVPB (not administered)  vancomycin (VANCOCIN) IVPB 1000 mg/200 mL premix (not administered)  sodium chloride 0.9 % bolus 1,000 mL (0 mLs Intravenous Stopped 07/07/16 2226)    And  sodium chloride 0.9 % bolus 1,000 mL (0 mLs Intravenous Stopped 07/07/16 2226)  vancomycin (VANCOCIN) IVPB 1000 mg/200 mL premix (0 mg Intravenous Stopped 07/07/16 2118)  ceFEPIme (MAXIPIME) 2 g in dextrose 5 % 50 mL IVPB (0 g Intravenous Stopped 07/07/16 2052)     Initial Impression / Assessment and Plan / ED Course  I have reviewed the triage vital signs and the nursing notes.  Pertinent labs & imaging results that were available during my care of the patient were reviewed by me and considered in my medical decision making (see chart for details).  Clinical Course as of Jul 08 2255  Tue Jul 07, 2016  2206 [facility] Chest [place] 1 View [MJ]  2207 [facility] Chest [place] 1 View [MJ]    Clinical Course User Index [MJ] Tanna Furry, MD    Normal lactate. Given 30 mL/kg IV fluid. Patient is acute renal failure. His baseline is less than 1 with creatinine. He is 7. He is making urine. Urine appears infected. Has apparent bibasilar infiltrates question aspiration. Was given vancomycin and Maxipime initially. Blood pressures improved. Consistently over 100. Continues to have good distal perfusion. We'll discussed with hospitalist regarding stepdown admission.  Final Clinical Impressions(s) / ED Diagnoses   Final diagnoses:  HCAP (healthcare-associated pneumonia)    Sepsis, due to unspecified organism [facility])  AKI (acute kidney injury) [facility])    New Prescriptions New Prescriptions   No medications on file     Tanna Furry, MD 07/07/16 2302

## 2016-07-07 NOTE — ED Triage Notes (Signed)
Pt brought in by [facility]. Pt currently residing at [facility]. Pt was found lethargic in wheel chair with minimal response to stimulus. Pt also has active cough. EMS gave 200cc bolus of NS on transport for BP of 70/50. Pt answering questions to stimulus.

## 2016-07-07 NOTE — Progress Notes (Signed)
Pharmacy Antibiotic Note  [name] is a 65 y.o. male admitted on 07/07/2016 with pneumonia.  Pharmacy has been consulted for vancomycin and cefepime dosing. Tmax is 101.1 and WBC is mildly elevated at 11. SCr is significantly above baseline at 7. Lactic acid is <2.   Plan: - Vancomycin 1gm IV Q48H - Cefepime 2gm IV x 1 then 500mg  IV Q24H - F/u renal fxn, C&S, clinical status and trough at SS  Height: 5\' 8"  (172.7 cm) Weight: 140 lb (63.5 kg) IBW/kg (Calculated) : 68.4  Temp (24hrs), Avg:101.1 F (38.4 C), Min:101.1 F (38.4 C), Max:101.1 F (38.4 C)   Recent Labs Lab 07/07/16 2015 07/07/16 2016  WBC 11.0*  --   CREATININE 7.00*  --   LATICACIDVEN  --  1.63    Estimated Creatinine Clearance: 9.2 mL/min (by C-G formula based on SCr of 7 mg/dL (H)).    Allergies  Allergen Reactions  . Aspirin Hives  . Penicillins Hives  . Other Other (See Comments)    Epidural caused seizure    Antimicrobials this admission: Vanc 11/28>> Cefepime 11/28>>  Dose adjustments this admission: N/A  Microbiology results: Pending  Thank you for allowing pharmacy to be a part of this patient's care.  Enis Leatherwood, Rande Lawman 07/07/2016 9:23 PM

## 2016-07-08 ENCOUNTER — Encounter (HOSPITAL_COMMUNITY): Payer: Self-pay | Admitting: General Practice

## 2016-07-08 DIAGNOSIS — A419 Sepsis, unspecified organism: Secondary | ICD-10-CM

## 2016-07-08 DIAGNOSIS — J189 Pneumonia, unspecified organism: Secondary | ICD-10-CM | POA: Diagnosis present

## 2016-07-08 DIAGNOSIS — R197 Diarrhea, unspecified: Secondary | ICD-10-CM | POA: Diagnosis present

## 2016-07-08 DIAGNOSIS — J181 Lobar pneumonia, unspecified organism: Secondary | ICD-10-CM

## 2016-07-08 DIAGNOSIS — I959 Hypotension, unspecified: Secondary | ICD-10-CM

## 2016-07-08 HISTORY — DX: Hypotension, unspecified: I95.9

## 2016-07-08 HISTORY — DX: Sepsis, unspecified organism: A41.9

## 2016-07-08 LAB — BLOOD CULTURE ID PANEL (REFLEXED)
Acinetobacter baumannii: NOT DETECTED
CANDIDA GLABRATA: NOT DETECTED
CANDIDA KRUSEI: NOT DETECTED
CANDIDA PARAPSILOSIS: NOT DETECTED
CARBAPENEM RESISTANCE: NOT DETECTED
Candida albicans: NOT DETECTED
Candida tropicalis: NOT DETECTED
ENTEROCOCCUS SPECIES: NOT DETECTED
ESCHERICHIA COLI: NOT DETECTED
Enterobacter cloacae complex: NOT DETECTED
Enterobacteriaceae species: DETECTED — AB
Haemophilus influenzae: NOT DETECTED
KLEBSIELLA OXYTOCA: NOT DETECTED
KLEBSIELLA PNEUMONIAE: NOT DETECTED
LISTERIA MONOCYTOGENES: NOT DETECTED
Neisseria meningitidis: NOT DETECTED
Proteus species: NOT DETECTED
Pseudomonas aeruginosa: NOT DETECTED
SERRATIA MARCESCENS: NOT DETECTED
STAPHYLOCOCCUS AUREUS BCID: NOT DETECTED
STAPHYLOCOCCUS SPECIES: NOT DETECTED
STREPTOCOCCUS PNEUMONIAE: NOT DETECTED
STREPTOCOCCUS PYOGENES: NOT DETECTED
Streptococcus agalactiae: NOT DETECTED
Streptococcus species: NOT DETECTED

## 2016-07-08 LAB — EXPECTORATED SPUTUM ASSESSMENT W GRAM STAIN, RFLX TO RESP C

## 2016-07-08 LAB — CBC
HCT: 28.1 % — ABNORMAL LOW (ref 39.0–52.0)
Hemoglobin: 8.5 g/dL — ABNORMAL LOW (ref 13.0–17.0)
MCH: 23.6 pg — AB (ref 26.0–34.0)
MCHC: 30.2 g/dL (ref 30.0–36.0)
MCV: 78.1 fL (ref 78.0–100.0)
PLATELETS: 311 10*3/uL (ref 150–400)
RBC: 3.6 MIL/uL — AB (ref 4.22–5.81)
RDW: 15.4 % (ref 11.5–15.5)
WBC: 11.9 10*3/uL — ABNORMAL HIGH (ref 4.0–10.5)

## 2016-07-08 LAB — BASIC METABOLIC PANEL
Anion gap: 13 (ref 5–15)
BUN: 57 mg/dL — AB (ref 6–20)
CO2: 17 mmol/L — ABNORMAL LOW (ref 22–32)
Calcium: 8.7 mg/dL — ABNORMAL LOW (ref 8.9–10.3)
Chloride: 108 mmol/L (ref 101–111)
Creatinine, Ser: 5.6 mg/dL — ABNORMAL HIGH (ref 0.61–1.24)
GFR calc Af Amer: 11 mL/min — ABNORMAL LOW (ref >60)
GFR, EST NON AFRICAN AMERICAN: 9 mL/min — AB (ref >60)
GLUCOSE: 114 mg/dL — AB (ref 65–99)
POTASSIUM: 3.4 mmol/L — AB (ref 3.5–5.1)
Sodium: 138 mmol/L (ref 135–145)

## 2016-07-08 LAB — RESPIRATORY PANEL BY PCR
ADENOVIRUS-RVPPCR: NOT DETECTED
Bordetella pertussis: NOT DETECTED
CORONAVIRUS 229E-RVPPCR: NOT DETECTED
CORONAVIRUS HKU1-RVPPCR: NOT DETECTED
CORONAVIRUS NL63-RVPPCR: NOT DETECTED
CORONAVIRUS OC43-RVPPCR: NOT DETECTED
Chlamydophila pneumoniae: NOT DETECTED
Influenza A: NOT DETECTED
Influenza B: NOT DETECTED
METAPNEUMOVIRUS-RVPPCR: NOT DETECTED
Mycoplasma pneumoniae: NOT DETECTED
PARAINFLUENZA VIRUS 1-RVPPCR: NOT DETECTED
PARAINFLUENZA VIRUS 2-RVPPCR: NOT DETECTED
PARAINFLUENZA VIRUS 3-RVPPCR: NOT DETECTED
Parainfluenza Virus 4: NOT DETECTED
RHINOVIRUS / ENTEROVIRUS - RVPPCR: NOT DETECTED
Respiratory Syncytial Virus: NOT DETECTED

## 2016-07-08 LAB — C DIFFICILE QUICK SCREEN W PCR REFLEX
C DIFFICLE (CDIFF) ANTIGEN: NEGATIVE
C Diff interpretation: NOT DETECTED
C Diff toxin: NEGATIVE

## 2016-07-08 LAB — INFLUENZA PANEL BY PCR (TYPE A & B)
Influenza A By PCR: NEGATIVE
Influenza B By PCR: NEGATIVE

## 2016-07-08 LAB — MRSA PCR SCREENING: MRSA BY PCR: NEGATIVE

## 2016-07-08 LAB — LACTIC ACID, PLASMA
LACTIC ACID, VENOUS: 1.1 mmol/L (ref 0.5–1.9)
LACTIC ACID, VENOUS: 1.5 mmol/L (ref 0.5–1.9)

## 2016-07-08 MED ORDER — INFLUENZA VAC SPLIT QUAD 0.5 ML IM SUSY
0.5000 mL | PREFILLED_SYRINGE | INTRAMUSCULAR | Status: AC
  Administered 2016-07-09: 0.5 mL via INTRAMUSCULAR
  Filled 2016-07-08: qty 0.5

## 2016-07-08 MED ORDER — METHYLPREDNISOLONE SODIUM SUCC 40 MG IJ SOLR
40.0000 mg | Freq: Two times a day (BID) | INTRAMUSCULAR | Status: DC
  Administered 2016-07-08 – 2016-07-11 (×7): 40 mg via INTRAVENOUS
  Filled 2016-07-08 (×7): qty 1

## 2016-07-08 MED ORDER — HEPARIN SODIUM (PORCINE) 5000 UNIT/ML IJ SOLN
5000.0000 [IU] | Freq: Three times a day (TID) | INTRAMUSCULAR | Status: DC
  Administered 2016-07-08 – 2016-07-13 (×17): 5000 [IU] via SUBCUTANEOUS
  Filled 2016-07-08 (×16): qty 1

## 2016-07-08 NOTE — H&P (Signed)
History and Physical    Terry EDMON [id number] DOB: [birth year] DOA: 07/07/2016   PCP: No PCP Per Patient Chief Complaint:  Chief Complaint  Patient presents with  . Code Sepsis    HPI: [name] is a 65 y.o. male with medical history significant of Stroke, prostate cancer with ? Bone mets, MGUS, chronic anemia.  At baseline he resides at an SNF.  He is sent in to the ED for decreased activity and being less communicative for past 24 hours.  Noted to be febrile with SBP of 70 initially.  Patient reports he has a cough, productive of sputum.  No vomiting, does have foul smelling diarrhea which is also new.  ED Course: Septic with initial hypotension that improves with 30 cc/kg bolus.  Put on cefepime and vanc empirically.  CXR suggestive of B basilar PNA, noted to have diarrhea which is sent off for c.diff testing, Tm 101.1 in ED.  Tachy to 109.  WBC 11k.  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  Review of Systems: As per HPI otherwise 10 point review of systems negative.    Past Medical History:  Diagnosis Date  . Cerebral embolism with cerebral infarction ([place])    residual left sided weakness/notes 12/12/2013  . Chronic kidney disease (CKD), stage II (mild)    [name] 12/12/2013  . Colon polyps 10/20/2013   TUBULAR ADENOMA (4).  . COPD 05/18/2007  . Elevated PSA 05/25/2007  . Hypertension 05/18/2007  . Prostate cancer ([place])   . Protein-calorie malnutrition, severe ([place])    [name] 12/12/2013  . Seizures ([place]) 12/05/2013; 12/08/2013; 12/11/2013   new onset; recurrent/notes 12/12/2013  . Stroke ([place]) 10/14/2010, 11/2012   Right centrum semiovale    Past Surgical History:  Procedure Laterality Date  . COLONOSCOPY W/ BIOPSIES  2015   4 polyps, tubular adenomas, pan diverticulosis, one lipoma.   . ESOPHAGOGASTRODUODENOSCOPY N/A 06/06/2014   Procedure: ESOPHAGOGASTRODUODENOSCOPY (EGD);  Surgeon: Gatha Mayer, MD;  Location: [facility] ENDOSCOPY;  Service:  Endoscopy;  Laterality: N/A;  . MULTIPLE EXTRACTIONS WITH ALVEOLOPLASTY N/A 10/23/2013   Procedure: Extraction of tooth #'s 1,2,3,4,5,6,7,11,12,13,14,15,16,20,21,22,23,24,27,28,29,32 with alveoloplasty and bilateral maxillary tuberosity reductions;  Surgeon: Lenn Cal, DDS;  Location: [facility];  Service: Oral Surgery;  Laterality: N/A;  . PEG TUBE PLACEMENT  12/2012   per IR  . TIBIA FRACTURE SURGERY Right    pin  . TOE AMPUTATION Right 2000s   Gangrene  . TONSILLECTOMY       reports that he has been smoking Cigarettes.  He has a 5.00 pack-year smoking history. He has never used smokeless tobacco. He reports that he drinks alcohol. He reports that he does not use drugs.  Allergies  Allergen Reactions  . Other Other (See Comments)    Epidural caused seizure  . Aspirin Hives  . Penicillins Hives    Family History  Problem Relation Age of Onset  . Liver disease Mother   . Heart attack Mother 71  . Colon cancer Neg Hx       Prior to Admission medications   Medication Sig Start Date End Date Taking? Authorizing Provider  albuterol (PROVENTIL HFA;VENTOLIN HFA) 108 (90 BASE) MCG/ACT inhaler Inhale 2 puffs into the lungs every 6 (six) hours as needed for wheezing. 10/10/13  Yes Corky Sox, MD  atorvastatin (LIPITOR) 10 MG tablet Take 10 mg by mouth daily at 6 PM.   Yes Historical Provider, MD  Calcium Carbonate-Vitamin D (CALTRATE 600+D) 600-400 MG-UNIT tablet Take 1 tablet by mouth 2 (two) times daily.   Yes Historical Provider, MD  chlorhexidine (PERIDEX) 0.12 % solution Use as directed 15 mLs in the mouth or throat daily.   Yes Historical Provider, MD  cloNIDine (CATAPRES) 0.1 MG tablet Take 0.1 mg by mouth 2 (two) times daily as needed (for SBP >160 or DBP >95).   Yes Historical Provider, MD  clopidogrel (PLAVIX) 75 MG tablet Take 1 tablet (75 mg total) by mouth daily with breakfast. 06/28/14  Yes Corky Sox, MD  folic acid (FOLVITE) 1 MG tablet Take 1 tablet (1 mg total) by  mouth daily. 07/24/14  Yes Corky Sox, MD  levETIRAcetam (KEPPRA) 500 MG tablet Take 1,500 mg by mouth 2 (two) times daily.   Yes Historical Provider, MD  lisinopril-hydrochlorothiazide (PRINZIDE,ZESTORETIC) 20-25 MG tablet Take 1 tablet by mouth daily.   Yes Historical Provider, MD  Multiple Vitamin (MULTIVITAMIN WITH MINERALS) TABS tablet Take 1 tablet by mouth daily.   Yes Historical Provider, MD  OVER THE COUNTER MEDICATION Take 1 Container by mouth 3 (three) times daily. Magic cup   Yes Historical Provider, MD  pantoprazole (PROTONIX) 40 MG tablet Take 1 tablet (40 mg total) by mouth daily. 06/28/14  Yes Corky Sox, MD  tiotropium (SPIRIVA HANDIHALER) 18 MCG inhalation capsule Place 1 capsule (18 mcg total) into inhaler and inhale daily. 04/04/14 07/07/16 Yes Corky Sox, MD    Physical Exam: Vitals:   07/07/16 2250 07/07/16 2300 07/07/16 2330 07/07/16 2345  BP: 96/65 (!) 122/53 112/59 (!) 100/48  Pulse: 105 101  109  Resp: 25 24 (!) 27 19  Temp:      TempSrc:      SpO2: (!) 87% 94%  94%  Weight:      Height:          Constitutional: NAD, calm, comfortable Eyes: PERRL, lids and conjunctivae normal ENMT: Mucous membranes are very dry, and making it difficult to understand patient. Posterior pharynx clear of any exudate or lesions.Normal dentition.  Neck: normal, supple, no masses, no thyromegaly Respiratory: Bibasilar crackles. Cardiovascular: Sinus tach to 109. Abdomen: no tenderness, no masses palpated. No hepatosplenomegaly. Bowel sounds positive.  Musculoskeletal: no clubbing / cyanosis. No joint deformity upper and lower extremities. Good ROM, no contractures. Normal muscle tone.  Skin: no rashes, lesions, ulcers. No induration Neurologic: CN 2-12 grossly intact. Sensation intact, DTR normal. Strength 5/5 in all 4.  Psychiatric: Normal judgment and insight. Alert and oriented x 3. Normal mood.    Labs on Admission: I have personally reviewed following labs and  imaging studies  CBC:  Recent Labs Lab 07/07/16 2015  WBC 11.0*  NEUTROABS 9.0*  HGB 8.5*  HCT 27.9*  MCV 77.5*  PLT 0000000   Basic Metabolic Panel:  Recent Labs Lab 07/07/16 2015  NA 136  [place] 3.6  CL 102  CO2 19*  GLUCOSE 122*  BUN 56*  CREATININE 7.00*  CALCIUM 9.2   GFR: Estimated Creatinine Clearance: 9.2 mL/min (by C-G formula based on SCr of 7 mg/dL (H)). Liver Function Tests:  Recent Labs Lab 07/07/16 2015  AST 52*  ALT 31  ALKPHOS 41  BILITOT 0.2*  PROT 7.9  ALBUMIN 3.3*   No results for input(s): LIPASE, AMYLASE in the last 168 hours. No results for input(s): AMMONIA in the last 168 hours. Coagulation Profile: No results for input(s): INR, PROTIME in the last 168 hours. Cardiac Enzymes: No  results for input(s): CKTOTAL, CKMB, CKMBINDEX, TROPONINI in the last 168 hours. BNP (last 3 results) No results for input(s): PROBNP in the last 8760 hours. HbA1C: No results for input(s): HGBA1C in the last 72 hours. CBG: No results for input(s): GLUCAP in the last 168 hours. Lipid Profile: No results for input(s): CHOL, HDL, LDLCALC, TRIG, CHOLHDL, LDLDIRECT in the last 72 hours. Thyroid Function Tests: No results for input(s): TSH, T4TOTAL, FREET4, T3FREE, THYROIDAB in the last 72 hours. Anemia Panel: No results for input(s): VITAMINB12, FOLATE, FERRITIN, TIBC, IRON, RETICCTPCT in the last 72 hours. Urine analysis:    Component Value Date/Time   COLORURINE AMBER (A) 07/07/2016 2059   APPEARANCEUR TURBID (A) 07/07/2016 2059   LABSPEC 1.021 07/07/2016 2059   PHURINE 5.0 07/07/2016 2059   GLUCOSEU NEGATIVE 07/07/2016 2059   HGBUR LARGE (A) 07/07/2016 2059   BILIRUBINUR SMALL (A) 07/07/2016 2059   KETONESUR 15 (A) 07/07/2016 2059   PROTEINUR 100 (A) 07/07/2016 2059   UROBILINOGEN 0.2 07/16/2014 1720   NITRITE NEGATIVE 07/07/2016 2059   LEUKOCYTESUR LARGE (A) 07/07/2016 2059   Sepsis Labs: @LABRCNTIP (procalcitonin:4,lacticidven:4) )No results found  for this or any previous visit (from the past 240 hour(s)).   Radiological Exams on Admission: Dg Chest Port 1 View  Result Date: 07/07/2016 CLINICAL DATA:  Shortness of breath and sepsis EXAM: PORTABLE CHEST 1 VIEW COMPARISON:  Chest CT 07/16/2014 FINDINGS: Examination is degraded by patient motion. There is atherosclerotic calcification within the aortic arch. Cardiomediastinal contours are otherwise unremarkable. There are bibasilar opacities, right greater than left. No pneumothorax or sizable pleural effusion. IMPRESSION: Motion degraded examination. Within that limitation, there are bibasilar opacities, right worse than left, which may indicate atelectasis or developing consolidation. Aortic atherosclerosis. Electronically Signed   By: Ulyses Jarred M.D.   On: 07/07/2016 22:02    EKG: Independently reviewed.  Assessment/Plan Principal Problem:   Sepsis associated hypotension (HCC) Active Problems:   Hypertension   Acute on chronic renal failure (HCC)   Iron deficiency anemia due to chronic blood loss   Diarrhea of presumed infectious origin   CAP (community acquired pneumonia)    1. Sepsis associated hypotension - DDx of source includes PNA that is seen on CXR, infectious diarrhea, UTI. 1. IVF: 30 cc/kg bolus in ED and 125 cc/hr 2. Empiric cefepime and vanc 3. Cultures pending 4. C.Diff pending 5. Influenza pending 2. AKF - 1. IVF for hypotension which is now improved 2. However, bun/creat ratio appears very low for pre-renal causes 3. CK pending 4. Patient with known h/o MGUS (see Dr. Grier Mitts office note from July), possible conversion to myeloma causing kidney failure? 5. If he dosent improve overnight likely warrants nephrology consultation in AM 6. Bladder scan is negative 3. HTN - hold BP meds   DVT prophylaxis: Heparin [place] Code Status: DNR - yellow form at bedside Family Communication: No family in room Consults called: None Admission status: Admit to  inpatient   Etta Quill DO [facility] Pager [phone] from 7PM-7AM  If 7AM-7PM, please contact the day physician for the patient [email or web address] Password [email or web address]  07/08/2016, 12:06 AM

## 2016-07-08 NOTE — ED Notes (Signed)
Pt noted to be diaphoretic, denies any pain, no change in mental status.  Bolus given from new NS bag.

## 2016-07-08 NOTE — ED Notes (Signed)
Small amount of green/yellow loose stool noted, pt cleaned and condom cath placed.  Pt tolerated well.

## 2016-07-08 NOTE — ED Notes (Signed)
flexi Seal rectal tub placed on pt for increased diarrhea. To prevent skin break down

## 2016-07-08 NOTE — Progress Notes (Signed)
Patient seen and examined  65 y.o. male with medical history significant for hypertension, COPD, CVA with chronic left-sided weakness, chronic kidney disease stage II,, prostate cancer with bony metastasis, MGUS, chronic anemia.  At baseline he resides at an SNF.  He is sent in to the ED for decreased activity and being less communicative for past 24 hours.  Noted to be febrile with  initial systolic blood pressure [id number]  Patient reports he has a cough, productive of sputum.  No vomiting, does have foul smelling diarrhea which is also new.  ED Course: Septic with initial hypotension that improves with 30 cc/kg bolus.  Put on cefepime and vanc empirically.  CXR suggestive of B basilar PNA, noted to have diarrhea negative for C. difficile, Tm 101.1 in ED.  Tachy to 109.  WBC 11k.  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  Assessment and plan   Sepsis associated hypotension (HCC) Likely the setting of healthcare associated pneumonia, viral gastroenteritis, possible UTI Continue broad-spectrum antibiotics Follow-up  results of blood culture Influenza PCR negative Lactate within normal limits Will also order respiratory viral panel     Hypertension-hold antihypertensive medications    Acute on chronic renal failure (HCC)-likely the setting of sepsis Baseline creatinine is 0.9 Presented with a creatinine of 7 of 5.6 Place Foley for strict I's and O's, we have been urinary retention given history of prostate cancer     Iron deficiency anemia due to chronic blood loss Baseline hemoglobin appears to be around 8.5 Continue Plavix, no signs of active bleeding Continue PPI  Acute COPD exacerbation-patient appears to be septic May benefit from IV steroids, continue nebulizer treatments, Spiriva  Dyslipidemia-continue statin    Diarrhea of presumed infectious origin-C. difficile negative

## 2016-07-08 NOTE — Progress Notes (Signed)
PHARMACY - PHYSICIAN COMMUNICATION CRITICAL VALUE ALERT - BLOOD CULTURE IDENTIFICATION (BCID)  Results for orders placed or performed during the hospital encounter of 07/07/16  Blood Culture ID Panel (Reflexed) (Collected: 07/07/2016  7:58 PM)  Result Value Ref Range   Enterococcus species NOT DETECTED NOT DETECTED   Listeria monocytogenes NOT DETECTED NOT DETECTED   Staphylococcus species NOT DETECTED NOT DETECTED   Staphylococcus aureus NOT DETECTED NOT DETECTED   Streptococcus species NOT DETECTED NOT DETECTED   Streptococcus agalactiae NOT DETECTED NOT DETECTED   Streptococcus pneumoniae NOT DETECTED NOT DETECTED   Streptococcus pyogenes NOT DETECTED NOT DETECTED   Acinetobacter baumannii NOT DETECTED NOT DETECTED   Enterobacteriaceae species DETECTED (A) NOT DETECTED   Enterobacter cloacae complex NOT DETECTED NOT DETECTED   Escherichia coli NOT DETECTED NOT DETECTED   Klebsiella oxytoca NOT DETECTED NOT DETECTED   Klebsiella pneumoniae NOT DETECTED NOT DETECTED   Proteus species NOT DETECTED NOT DETECTED   Serratia marcescens NOT DETECTED NOT DETECTED   Carbapenem resistance NOT DETECTED NOT DETECTED   Haemophilus influenzae NOT DETECTED NOT DETECTED   Neisseria meningitidis NOT DETECTED NOT DETECTED   Pseudomonas aeruginosa NOT DETECTED NOT DETECTED   Candida albicans NOT DETECTED NOT DETECTED   Candida glabrata NOT DETECTED NOT DETECTED   Candida krusei NOT DETECTED NOT DETECTED   Candida parapsilosis NOT DETECTED NOT DETECTED   Candida tropicalis NOT DETECTED NOT DETECTED    Name of physician (or Provider) Contacted: Dr. Allyson Sabal  Changes to prescribed antibiotics required: 1/2 Blood cultures with GNR, BCID with enterobacteriaceae. Patient with sepsis in the setting of HCAP and possible UTI per note. Patient currently on cefepime and vancomycin. GNR blood cultures covered with cefepime, OK to leave vanc on in setting of possible HCAP. Per Dr. Allyson Sabal, will leave on vanc for  another 24 hours and follow up cultures and sensitivities.   Carlean Jews, Pharm.D. PGY1 Pharmacy Resident 11/29/201712:22 PM Pager [phone]

## 2016-07-08 NOTE — ED Notes (Signed)
Myself, [place], [place] and [place], RN cleaned and changed patient; patient had bowel movement; placed clean dry chuks underneath and also performed a rectal temperature; placed a pillow underneath patient's back and buttocks and repositioned him on stretcher; patient is now resting, no needs at this time

## 2016-07-08 NOTE — ED Notes (Signed)
Removed rectal tube, due to it leaking more than helping

## 2016-07-09 DIAGNOSIS — J189 Pneumonia, unspecified organism: Secondary | ICD-10-CM

## 2016-07-09 DIAGNOSIS — N179 Acute kidney failure, unspecified: Secondary | ICD-10-CM

## 2016-07-09 LAB — URINE CULTURE: CULTURE: NO GROWTH

## 2016-07-09 LAB — CBC
HEMATOCRIT: 23.7 % — AB (ref 39.0–52.0)
Hemoglobin: 7.1 g/dL — ABNORMAL LOW (ref 13.0–17.0)
MCH: 23.4 pg — ABNORMAL LOW (ref 26.0–34.0)
MCHC: 30 g/dL (ref 30.0–36.0)
MCV: 78.2 fL (ref 78.0–100.0)
PLATELETS: 294 10*3/uL (ref 150–400)
RBC: 3.03 MIL/uL — AB (ref 4.22–5.81)
RDW: 15.5 % (ref 11.5–15.5)
WBC: 6.6 10*3/uL (ref 4.0–10.5)

## 2016-07-09 LAB — COMPREHENSIVE METABOLIC PANEL
ALBUMIN: 2.5 g/dL — AB (ref 3.5–5.0)
ALT: 31 U/L (ref 17–63)
AST: 75 U/L — AB (ref 15–41)
Alkaline Phosphatase: 33 U/L — ABNORMAL LOW (ref 38–126)
Anion gap: 8 (ref 5–15)
BILIRUBIN TOTAL: 0.5 mg/dL (ref 0.3–1.2)
BUN: 61 mg/dL — AB (ref 6–20)
CHLORIDE: 113 mmol/L — AB (ref 101–111)
CO2: 19 mmol/L — ABNORMAL LOW (ref 22–32)
CREATININE: 3.37 mg/dL — AB (ref 0.61–1.24)
Calcium: 7.8 mg/dL — ABNORMAL LOW (ref 8.9–10.3)
GFR calc Af Amer: 20 mL/min — ABNORMAL LOW (ref >60)
GFR calc non Af Amer: 17 mL/min — ABNORMAL LOW (ref >60)
GLUCOSE: 114 mg/dL — AB (ref 65–99)
POTASSIUM: 3.6 mmol/L (ref 3.5–5.1)
Sodium: 140 mmol/L (ref 135–145)
Total Protein: 6.7 g/dL (ref 6.5–8.1)

## 2016-07-09 LAB — OCCULT BLOOD X 1 CARD TO LAB, STOOL: FECAL OCCULT BLD: NEGATIVE

## 2016-07-09 LAB — PREPARE RBC (CROSSMATCH)

## 2016-07-09 MED ORDER — SODIUM CHLORIDE 0.9 % IV SOLN
Freq: Once | INTRAVENOUS | Status: AC
  Administered 2016-07-09: 10:00:00 via INTRAVENOUS

## 2016-07-09 NOTE — Progress Notes (Signed)
   Pt. did not want to complete Advanced Directive (AD) documentation.  Per pt. request, chaplain left AD documentation at bedside.  Will follow, as needed.  - Rev. [place] MDiv ThM

## 2016-07-09 NOTE — Progress Notes (Signed)
[facility] PROGRESS NOTE  SEDRICK VICTORINO [id number] DOB: [birth year] DOA: 07/07/2016   PCP: Wenda Low, MD     Assessment/Plan: Principal Problem:   Sepsis associated hypotension ([place]) Active Problems:   Hypertension   Acute on chronic renal failure (HCC)   Iron deficiency anemia due to chronic blood loss   Diarrhea of presumed infectious origin   CAP (community acquired pneumonia)   AKI (acute kidney injury) ([place])   HCAP (healthcare-associated pneumonia)    65 y.o.malewith medical history significant for hypertension, COPD, CVA with chronic left-sided weakness, chronic kidney disease stage II,, prostate cancer with bony metastasis, MGUS, chronic anemia. At baseline he resides at an SNF. He is sent in to the ED for decreased activity and being less communicative for past 24 hours. Noted to be febrile with  initial systolic blood pressure [id number]  Patient reports he has a cough, productive of sputum. No vomiting, does have foul smelling diarrhea which is also new.  ED Course:Septic with initial hypotension that improves with 30 cc/kg bolus. Put on cefepime and vanc empirically. CXR suggestive of B basilar PNA, noted to have diarrhea negative for C. difficile, Tm 101.1 in ED. Tachy to 109. WBC 11k. 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  Assessment and plan Sepsis associated hypotension (HCC),1/2 Blood cultures with GNR, BCID with enterobacteriaceae Likely the setting of healthcare associated pneumonia, viral gastroenteritis, possible UTI Continue broad-spectrum antibiotics, discontinue vancomycin today Follow-up  results of final blood culture Influenza PCR negative, respiratory virus panel negative       Hypertension-hold antihypertensive medications  Acute on chronic renal failure (HCC)-likely the setting of sepsis Baseline creatinine is 0.9 Presented with a creatinine of 7 of 5.6>3.37 Place Foley for  strict I's and O's, we have been urinary retention given history of prostate cancer   Iron deficiency anemia due to chronic blood loss Baseline hemoglobin appears to be around 8.5, alkaline phosphatase 7.1, will transfuse 1 unit of packed red blood cells Continue Plavix, no signs of active bleeding Continue PPI, FOBT   Acute COPD exacerbation-patient appears to be septic May benefit from IV steroids, continue nebulizer treatments, Spiriva  Dyslipidemia-continue statin  Diarrhea of presumed infectious origin-C. difficile negative  DVT prophylaxsis -heparin  Code Status:  DO NOT RESUSCITATE     Family Communication: Discussed in detail with the patient, all imaging results, lab results explained to the patient   Disposition Plan:  1-2 days      Consultants:  None  Procedures:  None  Antibiotics: Anti-infectives    Start     Dose/Rate Route Frequency Ordered Stop   07/09/16 2200  vancomycin (VANCOCIN) IVPB 1000 mg/200 mL premix     1,000 mg 200 mL/hr over 60 Minutes Intravenous Every 48 hours 07/07/16 2122     07/08/16 2200  ceFEPIme (MAXIPIME) 500 mg in dextrose 5 % 50 mL IVPB     500 mg 100 mL/hr over 30 Minutes Intravenous Every 24 hours 07/07/16 2122     07/07/16 2030  ceFEPIme (MAXIPIME) 2 g in dextrose 5 % 50 mL IVPB  Status:  Discontinued     2 g 100 mL/hr over 30 Minutes Intravenous  Once 07/07/16 2017 07/07/16 2017   07/07/16 2030  ceFEPIme (MAXIPIME) 2 g in dextrose 5 % 50 mL IVPB     2 g 100 mL/hr over 30 Minutes Intravenous  Once 07/07/16 2017 07/07/16 2052   07/07/16 2015  aztreonam (AZACTAM) 2 g in dextrose 5 % 50 mL IVPB  Status:  Discontinued     2 g 100 mL/hr over 30 Minutes Intravenous  Once 07/07/16 2011 07/07/16 2017   07/07/16 2015  vancomycin (VANCOCIN) IVPB 1000 mg/200 mL premix     1,000 mg 200 mL/hr over 60 Minutes Intravenous  Once 07/07/16 2011 07/07/16 2118         HPI/Subjective: Complaining of having no teeth, unable  to swallow  Objective: Vitals:   07/08/16 2055 07/08/16 2344 07/09/16 0000 07/09/16 0339  BP: 125/73  131/62 (!) 105/59  Pulse:    87  Resp: 20  (!) 21 (!) 25  Temp: 97.9 F (36.6 C) 99.3 F (37.4 C)  97.6 F (36.4 C)  TempSrc: Oral Oral  Oral  SpO2: 99%  94% 96%  Weight:    83.8 kg (184 lb 11.9 oz)  Height:        Intake/Output Summary (Last 24 hours) at 07/09/16 0749 Last data filed at 07/09/16 0600  Gross per 24 hour  Intake          2779.17 ml  Output             1025 ml  Net          1754.17 ml    Exam:  Examination:  General exam: Appears calm and comfortable  Respiratory system: Clear to auscultation. Respiratory effort normal. Cardiovascular system: S1 & S2 heard, RRR. No JVD, murmurs, rubs, gallops or clicks. No pedal edema. Gastrointestinal system: Abdomen is nondistended, soft and nontender. No organomegaly or masses felt. Normal bowel sounds heard. Central nervous system: Alert and oriented. No focal neurological deficits. Extremities: Symmetric 5 x 5 power. Skin: No rashes, lesions or ulcers Psychiatry: Judgement and insight appear normal. Mood & affect appropriate.     Data Reviewed: I have personally reviewed following labs and imaging studies  Micro Results Recent Results (from the past 240 hour(s))  Blood Culture (routine x 2)     Status: None (Preliminary result)   Collection Time: 07/07/16  7:58 PM  Result Value Ref Range Status   Specimen Description BLOOD RIGHT ARM  Final   Special Requests BOTTLES DRAWN AEROBIC AND ANAEROBIC 5 CC  Final   Culture  Setup Time   Final    GRAM NEGATIVE RODS IN BOTH AEROBIC AND ANAEROBIC BOTTLES Organism ID to follow CRITICAL RESULT CALLED TO, READ BACK BY AND VERIFIED WITH: C. Welles Pharm.D. 12:15 07/08/16  ([email or web address])    Culture GRAM NEGATIVE RODS  Final   Report Status PENDING  Incomplete  Blood Culture ID Panel (Reflexed)     Status: Abnormal   Collection Time: 07/07/16  7:58 PM  Result Value Ref Range  Status   Enterococcus species NOT DETECTED NOT DETECTED Final   Listeria monocytogenes NOT DETECTED NOT DETECTED Final   Staphylococcus species NOT DETECTED NOT DETECTED Final   Staphylococcus aureus NOT DETECTED NOT DETECTED Final   Streptococcus species NOT DETECTED NOT DETECTED Final   Streptococcus agalactiae NOT DETECTED NOT DETECTED Final   Streptococcus pneumoniae NOT DETECTED NOT DETECTED Final   Streptococcus pyogenes NOT DETECTED NOT DETECTED Final   Acinetobacter baumannii NOT DETECTED NOT DETECTED Final   Enterobacteriaceae species DETECTED (A) NOT DETECTED Final    Comment: CRITICAL RESULT CALLED TO, READ BACK BY AND VERIFIED WITH: C. Welles Pharm.D. 12:15 07/08/16 ([email or web address])    Enterobacter cloacae complex NOT DETECTED NOT DETECTED Final   Escherichia coli NOT DETECTED NOT DETECTED  Final   Klebsiella oxytoca NOT DETECTED NOT DETECTED Final   Klebsiella pneumoniae NOT DETECTED NOT DETECTED Final   Proteus species NOT DETECTED NOT DETECTED Final   Serratia marcescens NOT DETECTED NOT DETECTED Final   Carbapenem resistance NOT DETECTED NOT DETECTED Final   Haemophilus influenzae NOT DETECTED NOT DETECTED Final   Neisseria meningitidis NOT DETECTED NOT DETECTED Final   Pseudomonas aeruginosa NOT DETECTED NOT DETECTED Final   Candida albicans NOT DETECTED NOT DETECTED Final   Candida glabrata NOT DETECTED NOT DETECTED Final   Candida krusei NOT DETECTED NOT DETECTED Final   Candida parapsilosis NOT DETECTED NOT DETECTED Final   Candida tropicalis NOT DETECTED NOT DETECTED Final  Blood Culture (routine x 2)     Status: None (Preliminary result)   Collection Time: 07/07/16  8:08 PM  Result Value Ref Range Status   Specimen Description BLOOD RIGHT HAND  Final   Special Requests BOTTLES DRAWN AEROBIC AND ANAEROBIC 5 CC  Final   Culture  Setup Time   Final    GRAM NEGATIVE RODS AEROBIC BOTTLE ONLY CRITICAL VALUE NOTED.  VALUE IS CONSISTENT WITH PREVIOUSLY REPORTED AND CALLED  VALUE.    Culture NO GROWTH < 24 HOURS  Final   Report Status PENDING  Incomplete  Respiratory Panel by PCR     Status: None   Collection Time: 07/08/16 12:14 AM  Result Value Ref Range Status   Adenovirus NOT DETECTED NOT DETECTED Final   Coronavirus 229E NOT DETECTED NOT DETECTED Final   Coronavirus HKU1 NOT DETECTED NOT DETECTED Final   Coronavirus NL63 NOT DETECTED NOT DETECTED Final   Coronavirus OC43 NOT DETECTED NOT DETECTED Final   Metapneumovirus NOT DETECTED NOT DETECTED Final   Rhinovirus / Enterovirus NOT DETECTED NOT DETECTED Final   Influenza A NOT DETECTED NOT DETECTED Final   Influenza B NOT DETECTED NOT DETECTED Final   Parainfluenza Virus 1 NOT DETECTED NOT DETECTED Final   Parainfluenza Virus 2 NOT DETECTED NOT DETECTED Final   Parainfluenza Virus 3 NOT DETECTED NOT DETECTED Final   Parainfluenza Virus 4 NOT DETECTED NOT DETECTED Final   Respiratory Syncytial Virus NOT DETECTED NOT DETECTED Final   Bordetella pertussis NOT DETECTED NOT DETECTED Final   Chlamydophila pneumoniae NOT DETECTED NOT DETECTED Final   Mycoplasma pneumoniae NOT DETECTED NOT DETECTED Final  C difficile quick scan w PCR reflex     Status: None   Collection Time: 07/08/16  1:28 AM  Result Value Ref Range Status   C Diff antigen NEGATIVE NEGATIVE Final   C Diff toxin NEGATIVE NEGATIVE Final   C Diff interpretation No C. difficile detected.  Final  Culture, expectorated sputum-assessment     Status: None   Collection Time: 07/08/16  1:28 AM  Result Value Ref Range Status   Specimen Description EXPECTORATED SPUTUM  Final   Special Requests NONE  Final   Sputum evaluation   Final    MICROSCOPIC FINDINGS SUGGEST THAT THIS SPECIMEN IS NOT REPRESENTATIVE OF LOWER RESPIRATORY SECRETIONS. PLEASE RECOLLECT. Gram Stain Report Called to,Read Back By and Verified With: RN A.MCKEOWN [id number] MLM    Report Status 07/08/2016 FINAL  Final  MRSA PCR Screening     Status: None   Collection Time:  07/08/16  4:13 PM  Result Value Ref Range Status   MRSA by PCR NEGATIVE NEGATIVE Final    Comment:        The GeneXpert MRSA Assay (FDA approved for NASAL specimens only), is one component of  a comprehensive MRSA colonization surveillance program. It is not intended to diagnose MRSA infection nor to guide or monitor treatment for MRSA infections.     Radiology Reports Dg Chest Port 1 View  Result Date: 07/07/2016 CLINICAL DATA:  Shortness of breath and sepsis EXAM: PORTABLE CHEST 1 VIEW COMPARISON:  Chest CT 07/16/2014 FINDINGS: Examination is degraded by patient motion. There is atherosclerotic calcification within the aortic arch. Cardiomediastinal contours are otherwise unremarkable. There are bibasilar opacities, right greater than left. No pneumothorax or sizable pleural effusion. IMPRESSION: Motion degraded examination. Within that limitation, there are bibasilar opacities, right worse than left, which may indicate atelectasis or developing consolidation. Aortic atherosclerosis. Electronically Signed   By: Ulyses Jarred M.D.   On: 07/07/2016 22:02     CBC  Recent Labs Lab 07/07/16 2015 07/08/16 0250 07/09/16 0300  WBC 11.0* 11.9* 6.6  HGB 8.5* 8.5* 7.1*  HCT 27.9* 28.1* 23.7*  PLT 353 311 294  MCV 77.5* 78.1 78.2  MCH 23.6* 23.6* 23.4*  MCHC 30.5 30.2 30.0  RDW 15.5 15.4 15.5  LYMPHSABS 1.1  --   --   MONOABS 0.9  --   --   EOSABS 0.0  --   --   BASOSABS 0.0  --   --     Chemistries   Recent Labs Lab 07/07/16 2015 07/08/16 0250 07/09/16 0300  NA 136 138 140  K 3.6 3.4* 3.6  CL 102 108 113*  CO2 19* 17* 19*  GLUCOSE 122* 114* 114*  BUN 56* 57* 61*  CREATININE 7.00* 5.60* 3.37*  CALCIUM 9.2 8.7* 7.8*  AST 52*  --  75*  ALT 31  --  31  ALKPHOS 41  --  33*  BILITOT 0.2*  --  0.5   ------------------------------------------------------------------------------------------------------------------ estimated creatinine clearance is 22.4 mL/min (by C-G  formula based on SCr of 3.37 mg/dL (H)). ------------------------------------------------------------------------------------------------------------------ No results for input(s): HGBA1C in the last 72 hours. ------------------------------------------------------------------------------------------------------------------ No results for input(s): CHOL, HDL, LDLCALC, TRIG, CHOLHDL, LDLDIRECT in the last 72 hours. ------------------------------------------------------------------------------------------------------------------ No results for input(s): TSH, T4TOTAL, T3FREE, THYROIDAB in the last 72 hours.  Invalid input(s): FREET3 ------------------------------------------------------------------------------------------------------------------ No results for input(s): VITAMINB12, FOLATE, FERRITIN, TIBC, IRON, RETICCTPCT in the last 72 hours.  Coagulation profile No results for input(s): INR, PROTIME in the last 168 hours.  No results for input(s): DDIMER in the last 72 hours.  Cardiac Enzymes No results for input(s): CKMB, TROPONINI, MYOGLOBIN in the last 168 hours.  Invalid input(s): CK ------------------------------------------------------------------------------------------------------------------ Invalid input(s): POCBNP   CBG: No results for input(s): GLUCAP in the last 168 hours.     Studies: Dg Chest Port 1 View  Result Date: 07/07/2016 CLINICAL DATA:  Shortness of breath and sepsis EXAM: PORTABLE CHEST 1 VIEW COMPARISON:  Chest CT 07/16/2014 FINDINGS: Examination is degraded by patient motion. There is atherosclerotic calcification within the aortic arch. Cardiomediastinal contours are otherwise unremarkable. There are bibasilar opacities, right greater than left. No pneumothorax or sizable pleural effusion. IMPRESSION: Motion degraded examination. Within that limitation, there are bibasilar opacities, right worse than left, which may indicate atelectasis or developing  consolidation. Aortic atherosclerosis. Electronically Signed   By: Ulyses Jarred M.D.   On: 07/07/2016 22:02      Lab Results  Component Value Date   HGBA1C 5.6 11/01/2013   HGBA1C 5.5 12/05/2012   HGBA1C  10/14/2010    5.6 (NOTE)  According to the ADA Clinical Practice Recommendations for 2011, when HbA1c is used as a screening test:   >=6.5%   Diagnostic of Diabetes Mellitus           (if abnormal result  is confirmed)  5.7-6.4%   Increased risk of developing Diabetes Mellitus  References:Diagnosis and Classification of Diabetes Mellitus,Diabetes [id number] 1):S62-S69 and Standards of Medical Care in         Diabetes - 2011,Diabetes [id number]  (Suppl 1):S11-S61.   Lab Results  Component Value Date   LDLCALC 40 11/01/2013   CREATININE 3.37 (H) 07/09/2016       Scheduled Meds: . atorvastatin  10 mg Oral q1800  . calcium-vitamin D  1 tablet Oral BID WC  . ceFEPime (MAXIPIME) IV  500 mg Intravenous Q24H  . chlorhexidine  15 mL Mouth/Throat Daily  . clopidogrel  75 mg Oral Q breakfast  . folic acid  1 mg Oral Daily  . heparin  5,000 Units Subcutaneous Q8H  . Influenza vac split quadrivalent PF  0.5 mL Intramuscular Tomorrow-1000  . levETIRAcetam  1,500 mg Oral BID  . methylPREDNISolone (SOLU-MEDROL) injection  40 mg Intravenous Q12H  . multivitamin with minerals  1 tablet Oral Daily  . pantoprazole  40 mg Oral Daily  . tiotropium  18 mcg Inhalation Daily  . vancomycin  1,000 mg Intravenous Q48H   Continuous Infusions: . sodium chloride 125 mL/hr at 07/09/16 0100     LOS: 2 days    Time spent: >30 MINS    [facility]  [facility] Pager [phone]. If 7PM-7AM, please contact night-coverage at [email or web address], password [facility] 07/09/2016, 7:49 AM  LOS: 2 days

## 2016-07-09 NOTE — Progress Notes (Signed)
07/09/16 Influenza vaccine given in left deltoid at 1015. Lot#: [id number] and expire 01/14/17. [facility] RN.

## 2016-07-09 NOTE — Clinical Social Work Note (Signed)
Clinical Social Work Assessment  Patient Details  Name: [name] MRN: [id number] Date of Birth: October 03, 1950  Date of referral:  07/09/16               Reason for consult:  Facility Placement                Permission sought to share information with:  Facility Art therapist granted to share information::  Yes, Verbal Permission Granted  Name::        Agency::  [facility]  Relationship::     Contact Information:     Housing/Transportation Living arrangements for the past 2 months:  [place] of Information:  Patient Patient Interpreter Needed:  None Criminal Activity/Legal Involvement Pertinent to Current Situation/Hospitalization:  No - Comment as needed Significant Relationships:  Other Family Members, Friend Lives with:  Facility Resident Do you feel safe going back to the place where you live?  Yes Need for family participation in patient care:  No (Coment)  Care giving concerns:  None- pt is LTC [facility] at [facility]- no concerns with care.   Social Worker assessment / plan:  CSW spoke with pt in regards to plan at time of DC.  Employment status:  Retired Nurse, adult PT Recommendations:  Not assessed at this time Information / Referral to community resources:     Patient/Family's Response to care:  Pt is agreeable to go back to [facility] when stable.  Patient/Family's Understanding of and Emotional Response to Diagnosis, Current Treatment, and Prognosis:  No questions or concerns- hopeful he'll be stable to return to SNF soon.  Emotional Assessment Appearance:    Attitude/Demeanor/Rapport:    Affect (typically observed):  Appropriate Orientation:  Oriented to Situation, Oriented to  Time, Oriented to Place, Oriented to Self Alcohol / Substance use:  Not Applicable Psych involvement (Current and /or in the community):  No (Comment)  Discharge Needs  Concerns to be addressed:  Care  Coordination Readmission within the last 30 days:  No Current discharge risk:  None Barriers to Discharge:  Requiring sitter/restraints, Continued Medical Work up   Jorge Ny, LCSW 07/09/2016, 1:02 PM

## 2016-07-09 NOTE — NC FL2 (Signed)
[place] LEVEL OF CARE SCREENING TOOL     IDENTIFICATION  Patient Name: [name] Birthdate: 03-05-1951 Sex: male Admission Date (Current Location): 07/07/2016  [facility] and [place] Number:  Herbalist and Address:  [facility], [place] [address] [place], [place] [address]      Provider Number: [id number]  Attending Physician Name and Address:  Reyne Dumas, MD  Relative Name and Phone Number:       Current Level of Care: Hospital Recommended Level of Care: [place] Prior Approval Number:    Date Approved/Denied:   PASRR Number:    Discharge Plan: SNF    Current Diagnoses: Patient Active Problem List   Diagnosis Date Noted  . AKI (acute kidney injury) ([place])   . HCAP (healthcare-associated pneumonia)   . Diarrhea of presumed infectious origin 07/08/2016  . CAP (community acquired pneumonia) 07/08/2016  . Sepsis associated hypotension ([place]) 07/07/2016  . Iron deficiency anemia due to chronic blood loss 01/23/2016  . Anemia in neoplastic disease 01/23/2016  . Prostate cancer metastatic to bone ([place]) 01/23/2016  . Weakness   . Palliative care encounter 07/17/2014  . Decreased appetite 07/17/2014  . Increased anion gap metabolic acidosis [id number]  . Acute on chronic renal failure ([place]) 07/16/2014  . Duodenitis 06/06/2014  . Seizure ([place]) 06/03/2014  . GI bleeding 06/03/2014  . Acute blood loss anemia 06/03/2014  . Occult blood in stools 05/14/2014  . Physical deconditioning 02/07/2014  . Anemia, iron deficiency 12/13/2013  . Anemia, chronic disease 12/12/2013  . Protein-calorie malnutrition, severe ([place]) 12/04/2013  . Seizures ([place]) 12/02/2013  . Acute-on-chronic kidney injury ([place]) 12/02/2013  . Left-sided weakness, residual s/p cva 11/24/2013  . CVA (cerebral vascular accident) ([place]) 11/23/2013  . Acute ischemic stroke ([place]) 11/02/2013  . Generalized weakness 10/24/2013  . Aftercare  following surgery of teeth, oral cavity or digestive system 10/23/2013  . Routine health maintenance 10/10/2013  . Prostate cancer ([place]) 06/19/2013  . Localized swelling, mass, or lump of lower extremity 04/21/2013  . Stroke, acute, embolic ([place]) [id number]  . Cerebral embolism with cerebral infarction ([place]) 12/05/2012  . Dyslipidemia 12/05/2012  . Alcohol abuse 12/05/2012  . Claudication, intermittent ([place]) 11/07/2012  . Tobacco abuse 10/18/2012  . History of stroke 10/14/2010  . Elevated PSA 05/25/2007  . Hypertension 05/18/2007  . COPD 05/18/2007    Orientation RESPIRATION BLADDER Height & Weight     Time, Self, Situation, Place  O2 (2L [place]) Continent Weight: 184 lb 11.9 oz (83.8 kg) Height:  5\' 8"  (172.7 cm)  BEHAVIORAL SYMPTOMS/MOOD NEUROLOGICAL BOWEL NUTRITION STATUS      Incontinent Diet (see DC summary)  AMBULATORY STATUS COMMUNICATION OF NEEDS Skin   Extensive Assist Verbally Normal                       Personal Care Assistance Level of Assistance  Bathing, Dressing Bathing Assistance: Maximum assistance   Dressing Assistance: Maximum assistance     Functional Limitations Info             SPECIAL CARE FACTORS FREQUENCY                       Contractures      Additional Factors Info  Code Status, Allergies Code Status Info: DNR Allergies Info: Other, Aspirin, Penicillins           Current Medications (07/09/2016):  This is the current hospital  active medication list Current Facility-Administered Medications  Medication Dose Route Frequency Provider Last Rate Last Dose  . 0.9 %  sodium chloride infusion   Intravenous Continuous Etta Quill, DO 125 mL/hr at 07/09/16 0100    . albuterol (PROVENTIL) (2.5 MG/3ML) 0.083% nebulizer solution 2.5 mg  2.5 mg Inhalation Q6H PRN Etta Quill, DO      . atorvastatin (LIPITOR) tablet 10 mg  10 mg Oral q1800 Etta Quill, DO   10 mg at 07/08/16 1730  . calcium-vitamin D (OSCAL WITH D)  500-200 MG-UNIT per tablet 1 tablet  1 tablet Oral BID WC Etta Quill, DO   1 tablet at 07/09/16 1023  . ceFEPIme (MAXIPIME) 500 mg in dextrose 5 % 50 mL IVPB  500 mg Intravenous Q24H Valeda Malm Rumbarger, RPH   500 mg at 07/08/16 2158  . chlorhexidine (PERIDEX) 0.12 % solution 15 mL  15 mL Mouth/Throat Daily Etta Quill, DO   15 mL at 07/09/16 1012  . clopidogrel (PLAVIX) tablet 75 mg  75 mg Oral Q breakfast Etta Quill, DO   75 mg at [id number]  . folic acid (FOLVITE) tablet 1 mg  1 mg Oral Daily Etta Quill, DO   1 mg at 07/09/16 1012  . heparin injection 5,000 Units  5,000 Units Subcutaneous Q8H Etta Quill, DO   5,000 Units at 07/09/16 [id number]  . levETIRAcetam (KEPPRA) tablet 1,500 mg  1,500 mg Oral BID Etta Quill, DO   1,500 mg at 07/09/16 1011  . methylPREDNISolone sodium succinate (SOLU-MEDROL) 40 mg/mL injection 40 mg  40 mg Intravenous Q12H Reyne Dumas, MD   40 mg at 07/09/16 1012  . multivitamin with minerals tablet 1 tablet  1 tablet Oral Daily Etta Quill, DO   1 tablet at 07/09/16 1011  . pantoprazole (PROTONIX) EC tablet 40 mg  40 mg Oral Daily Etta Quill, DO   40 mg at 07/09/16 1011  . tiotropium (SPIRIVA) inhalation capsule 18 mcg  18 mcg Inhalation Daily Etta Quill, DO   18 mcg at 07/09/16 0907  . vancomycin (VANCOCIN) IVPB 1000 mg/200 mL premix  1,000 mg Intravenous Q48H Valeda Malm Rumbarger, [facility]         Discharge Medications: Please see discharge summary for a list of discharge medications.  Relevant Imaging Results:  Relevant Lab Results:   Additional Information SS#: [id number]  Jorge Ny, LCSW

## 2016-07-10 DIAGNOSIS — Z8673 Personal history of transient ischemic attack (TIA), and cerebral infarction without residual deficits: Secondary | ICD-10-CM

## 2016-07-10 DIAGNOSIS — Z880 Allergy status to penicillin: Secondary | ICD-10-CM

## 2016-07-10 DIAGNOSIS — F17210 Nicotine dependence, cigarettes, uncomplicated: Secondary | ICD-10-CM

## 2016-07-10 DIAGNOSIS — C61 Malignant neoplasm of prostate: Secondary | ICD-10-CM

## 2016-07-10 DIAGNOSIS — Z886 Allergy status to analgesic agent status: Secondary | ICD-10-CM

## 2016-07-10 DIAGNOSIS — D472 Monoclonal gammopathy: Secondary | ICD-10-CM

## 2016-07-10 DIAGNOSIS — Z8379 Family history of other diseases of the digestive system: Secondary | ICD-10-CM

## 2016-07-10 DIAGNOSIS — N170 Acute kidney failure with tubular necrosis: Secondary | ICD-10-CM

## 2016-07-10 DIAGNOSIS — A029 Salmonella infection, unspecified: Secondary | ICD-10-CM

## 2016-07-10 DIAGNOSIS — Z800 Family history of malignant neoplasm of digestive organs: Secondary | ICD-10-CM

## 2016-07-10 DIAGNOSIS — Z8249 Family history of ischemic heart disease and other diseases of the circulatory system: Secondary | ICD-10-CM

## 2016-07-10 DIAGNOSIS — N181 Chronic kidney disease, stage 1: Secondary | ICD-10-CM

## 2016-07-10 DIAGNOSIS — Z888 Allergy status to other drugs, medicaments and biological substances status: Secondary | ICD-10-CM

## 2016-07-10 DIAGNOSIS — C7951 Secondary malignant neoplasm of bone: Secondary | ICD-10-CM

## 2016-07-10 LAB — BASIC METABOLIC PANEL
Anion gap: 8 (ref 5–15)
BUN: 58 mg/dL — AB (ref 6–20)
CALCIUM: 8 mg/dL — AB (ref 8.9–10.3)
CHLORIDE: 115 mmol/L — AB (ref 101–111)
CO2: 18 mmol/L — AB (ref 22–32)
CREATININE: 2.24 mg/dL — AB (ref 0.61–1.24)
GFR calc Af Amer: 33 mL/min — ABNORMAL LOW (ref >60)
GFR calc non Af Amer: 29 mL/min — ABNORMAL LOW (ref >60)
Glucose, Bld: 140 mg/dL — ABNORMAL HIGH (ref 65–99)
Potassium: 3.4 mmol/L — ABNORMAL LOW (ref 3.5–5.1)
SODIUM: 141 mmol/L (ref 135–145)

## 2016-07-10 LAB — CBC
HEMATOCRIT: 26.7 % — AB (ref 39.0–52.0)
HEMOGLOBIN: 8.3 g/dL — AB (ref 13.0–17.0)
MCH: 24.1 pg — AB (ref 26.0–34.0)
MCHC: 31.1 g/dL (ref 30.0–36.0)
MCV: 77.4 fL — ABNORMAL LOW (ref 78.0–100.0)
Platelets: 261 10*3/uL (ref 150–400)
RBC: 3.45 MIL/uL — ABNORMAL LOW (ref 4.22–5.81)
RDW: 15.5 % (ref 11.5–15.5)
WBC: 4.6 10*3/uL (ref 4.0–10.5)

## 2016-07-10 LAB — TYPE AND SCREEN
ABO/RH(D): B POS
Antibody Screen: NEGATIVE
Unit division: 0

## 2016-07-10 LAB — GLUCOSE, CAPILLARY: Glucose-Capillary: 111 mg/dL — ABNORMAL HIGH (ref 65–99)

## 2016-07-10 MED ORDER — HYDRALAZINE HCL 20 MG/ML IJ SOLN
10.0000 mg | Freq: Four times a day (QID) | INTRAMUSCULAR | Status: DC | PRN
  Administered 2016-07-11: 10 mg via INTRAVENOUS
  Filled 2016-07-10 (×2): qty 1

## 2016-07-10 MED ORDER — DEXTROSE 5 % IV SOLN
1.0000 g | INTRAVENOUS | Status: DC
  Administered 2016-07-10: 1 g via INTRAVENOUS
  Filled 2016-07-10: qty 1

## 2016-07-10 MED ORDER — POTASSIUM CHLORIDE CRYS ER 20 MEQ PO TBCR
40.0000 meq | EXTENDED_RELEASE_TABLET | Freq: Once | ORAL | Status: AC
Start: 2016-07-10 — End: 2016-07-10
  Administered 2016-07-10: 40 meq via ORAL
  Filled 2016-07-10: qty 2

## 2016-07-10 MED ORDER — LEVOFLOXACIN IN D5W 750 MG/150ML IV SOLN
750.0000 mg | INTRAVENOUS | Status: DC
  Administered 2016-07-11: 750 mg via INTRAVENOUS
  Filled 2016-07-10: qty 150

## 2016-07-10 MED ORDER — POTASSIUM CHLORIDE IN NACL 20-0.9 MEQ/L-% IV SOLN
INTRAVENOUS | Status: DC
  Administered 2016-07-10 – 2016-07-11 (×3): via INTRAVENOUS
  Filled 2016-07-10 (×3): qty 1000

## 2016-07-10 NOTE — Progress Notes (Signed)
Blood culture positive for salmonella Md made aware.

## 2016-07-10 NOTE — Progress Notes (Signed)
Patient drank oral contrast at 2200, second dose 2300 for pickup by CT at midnight as discussed.

## 2016-07-10 NOTE — Care Management Important Message (Signed)
Important Message  Patient Details  Name: CALAB KATZENMEYER MRN: [id number] Date of Birth: 04/08/1951   Medicare Important Message Given:  Yes    Nathen May 07/10/2016, 12:35 PM

## 2016-07-10 NOTE — Progress Notes (Signed)
Pharmacy Antibiotic Note [name] is a 65 y.o. male admitted on 07/07/2016 with sepsis and AKI. Currently with GNR bacteremia (Enterobacter per BCID) and on day 3 of treatment with Cefepime.  SCr continues to improve down to 2.2 <  3.3 < 5.6. WBC decreasing and fever curve improved.   Plan: 1. With improvement in renal function will adjust Cefepime to 1 gram IV every 24 hours  2. Follow up renal function, final culture data and adjust abx as needed   Height: 5\' 8"  (172.7 cm) Weight: 183 lb 13.8 oz (83.4 kg) IBW/kg (Calculated) : 68.4  Temp (24hrs), Avg:98.3 F (36.8 C), Min:97.7 F (36.5 C), Max:98.9 F (37.2 C)   Recent Labs Lab 07/07/16 2015 07/07/16 2016 07/07/16 2341 07/07/16 2358 07/08/16 0250 07/09/16 0300 07/10/16 0228  WBC 11.0*  --   --   --  11.9* 6.6 4.6  CREATININE 7.00*  --   --   --  5.60* 3.37* 2.24*  LATICACIDVEN  --  1.63 1.5 1.63 1.1  --   --     Estimated Creatinine Clearance: 33.7 mL/min (by C-G formula based on SCr of 2.24 mg/dL (H)).    Allergies  Allergen Reactions  . Other Other (See Comments)    Epidural caused seizure  . Aspirin Hives  . Penicillins Hives   Antimicrobials this admission: Vanc 11/28>>11/30  Cefepime 11/28>>  Dose adjustments this admission:  n/a  Microbiology results:  11/28 BCx: GNR/ Enterobacter per BCID 11/28 UCx: ngF  11/29 Sputum: recollect  11/29 MRSA PCR: negative  11/29: C.diff negative    Thank you for allowing pharmacy to be a part of this patient's care.  Vincenza Hews, PharmD, BCPS 07/10/2016, 8:34 AM Pager: [phone]

## 2016-07-10 NOTE — Evaluation (Signed)
Clinical/Bedside Swallow Evaluation Patient Details  Name: [name] MRN: [id number] Date of Birth: 02-27-1951  Today's Date: 07/10/2016 Time: SLP Start Time (ACUTE ONLY): 1000 SLP Stop Time (ACUTE ONLY): 1020 SLP Time Calculation (min) (ACUTE ONLY): 20 min  Past Medical History:  Past Medical History:  Diagnosis Date  . Cerebral embolism with cerebral infarction ([place])    residual left sided weakness/notes 12/12/2013  . Chronic kidney disease (CKD), stage II (mild)    [name] 12/12/2013  . Colon polyps 10/20/2013   TUBULAR ADENOMA (4).  . COPD 05/18/2007  . Elevated PSA 05/25/2007  . High cholesterol   . Hypertension 05/18/2007  . Pneumonia    "couple times" (07/08/2016)  . Prostate cancer ([place])   . Protein-calorie malnutrition, severe ([place])    [name] 12/12/2013  . Seizures ([place]) 12/05/2013; 12/08/2013; 12/11/2013   new onset; recurrent/notes 12/12/2013  . Sepsis associated hypotension ([place]) 07/08/2016   [name] 07/08/2016  . Stroke ([place]) 10/14/2010, 11/2012   Right centrum semiovale  . Stroke [facility])    "I've had about 5" (07/08/2016)   Past Surgical History:  Past Surgical History:  Procedure Laterality Date  . COLONOSCOPY W/ BIOPSIES  2015   4 polyps, tubular adenomas, pan diverticulosis, one lipoma.   . ESOPHAGOGASTRODUODENOSCOPY N/A 06/06/2014   Procedure: ESOPHAGOGASTRODUODENOSCOPY (EGD);  Surgeon: Gatha Mayer, MD;  Location: [facility] ENDOSCOPY;  Service: Endoscopy;  Laterality: N/A;  . FRACTURE SURGERY    . MULTIPLE EXTRACTIONS WITH ALVEOLOPLASTY N/A 10/23/2013   Procedure: Extraction of tooth #'s 1,2,3,4,5,6,7,11,12,13,14,15,16,20,21,22,23,24,27,28,29,32 with alveoloplasty and bilateral maxillary tuberosity reductions;  Surgeon: Lenn Cal, DDS;  Location: [place];  Service: Oral Surgery;  Laterality: N/A;  . PEG TUBE PLACEMENT  12/2012   per IR  . TIBIA FRACTURE SURGERY Right    pin  . TOE AMPUTATION Right 2000s   "middle toe"; Gangrene  . TONSILLECTOMY  1956   HPI:  65  y.o.malewith medical history significant of Stroke, prostate cancer with ? Bone mets, MGUS, chronic anemia. At baseline he resides at a SNF. Dx Sepsis, COPD exacerbation, Acute on chronic renal failure.  Pt has been followed intermittently over the years by SLP services for acute dysphagia, generally resolves when medical condition improves.  MBS 2014 revealed likely anterior cervical osteophytes that have the potential to interfere with swallowing.    Assessment / Plan / Recommendation Clinical Impression  Pt presents with functional oropharyngeal swallow with prolonged mastication due to absence of teeth.  Pt declines to use dentures.  Swallow response is brisk; there are no s/s of aspiration.  Intermittent belching throughout assessment.  Recommend advancing diet to dysphagia 2, thin liquids.  SLP will f/u x1 to ensure toleration of upgraded diet.  Pt agrees.     Aspiration Risk  Mild aspiration risk    Diet Recommendation   dys 2, thin liquids  Medication Administration: Whole meds with puree    Other  Recommendations Oral Care Recommendations: Oral care BID   Follow up Recommendations None      Frequency and Duration min 1 x/week  1 week       Prognosis        Swallow Study   General Date of Onset: 07/08/16 HPI: 65 y.o.malewith medical history significant of Stroke, prostate cancer with ? Bone mets, MGUS, chronic anemia. At baseline he resides at a SNF. Dx Sepsis, COPD exacerbation, Acute on chronic renal failure.  Pt has been followed intermittently over the years by SLP services for acute dysphagia, generally resolves  when medical condition improves.  MBS 2014 revealed likely anterior cervical osteophytes that have the potential to interfere with swallowing.  Type of Study: Bedside Swallow Evaluation Previous Swallow Assessment: see HPI Diet Prior to this Study: Thin liquids;Dysphagia 1 (puree) Temperature Spikes Noted: No Respiratory Status: Room air (bibasilar  opacities, right greater than left per cxr) History of Recent Intubation: No Behavior/Cognition: Alert;Cooperative Oral Cavity Assessment: Within Functional Limits Oral Cavity - Dentition: Edentulous Vision: Functional for self-feeding Self-Feeding Abilities: Able to feed self Patient Positioning: Upright in bed Baseline Vocal Quality: Normal Volitional Cough: Strong Volitional Swallow: Able to elicit    Oral/Motor/Sensory Function Overall Oral Motor/Sensory Function: Mild impairment   Ice Chips Ice chips: Within functional limits Presentation: Spoon   Thin Liquid Thin Liquid: Within functional limits Presentation: Cup;Straw    Nectar Thick Nectar Thick Liquid: Not tested   Honey Thick Honey Thick Liquid: Not tested   Puree Puree: Within functional limits Presentation: Self Fed;Spoon   Solid   GO   Solid: Impaired Oral Phase Functional Implications: Prolonged oral transit        Juan Quam [name] 07/10/2016,10:24 AM  Estill Bamberg L. Tivis Ringer, [place] CCC/SLP Pager [phone]

## 2016-07-10 NOTE — Progress Notes (Addendum)
[facility] PROGRESS NOTE  [name] [id number] DOB: 25-Aug-1950 DOA: 07/07/2016   PCP: Wenda Low, MD     Assessment/Plan: Principal Problem:   Sepsis associated hypotension ([place]) Active Problems:   Hypertension   Acute on chronic renal failure (HCC)   Iron deficiency anemia due to chronic blood loss   Diarrhea of presumed infectious origin   CAP (community acquired pneumonia)   AKI (acute kidney injury) ([place])   HCAP (healthcare-associated pneumonia)    65 y.o.malewith medical history significant for hypertension, COPD, CVA with chronic left-sided weakness, chronic kidney disease stage II,, prostate cancer with bony metastasis, MGUS, chronic anemia. At baseline he resides at an SNF. He is sent in to the ED for decreased activity and being less communicative for past 24 hours. Noted to be febrile with  initial systolic blood pressure 62/22  Patient reports he has a cough, productive of sputum. No vomiting, does have foul smelling diarrhea which is also new.   Patient found to be febrile, met sepsis criteria.CXR suggestive of B basilar PNA, noted to have diarrhea negative for C. difficile, Tm 101.1 in ED. Tachy to 109. WBC 11k. 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   Assessment and plan Gram-negativeSepsis associated hypotension (HCC),1/2 Blood cultures with GNR, Salmonella species, Likely the setting of healthcare associated pneumonia, viral gastroenteritis, possible UTI Continue cefepime, day 3,  Vancomycin discontinued 11/30,change cefepime to levaquin Follow-up  results of final blood culture Influenza PCR negative, respiratory virus panel negative  Will order CT abdomen and pelvis to further evaluate for source of salmonella sepsis, also requested ID consult      Hypertension-hold antihypertensive medications   Acute on chronic renal failure (HCC)-likely the setting of sepsis Baseline creatinine is  0.9 Presented with a creatinine of 7 of 5.6>3.37>2.24  Urine output adequate,1525 cc in 24 hours   Iron deficiency anemia due to chronic blood loss Baseline hemoglobin appears to be around 8.5, alkaline phosphatase 7.1, status post 1 unit of packed red blood cells, hemoglobin 8.3 Continue Plavix, no signs of active bleeding Continue PPI, FOBT negative   Acute COPD exacerbation-patient appears to be septic Change IV steroids to po, continue nebulizer treatments, Spiriva  Dyslipidemia-continue statin  Diarrhea of presumed infectious origin-C. difficile negative  DVT prophylaxsis -heparin  Code Status:  DO NOT RESUSCITATE     Family Communication: Discussed in detail with the patient, all imaging results, lab results explained to the patient   Disposition Plan:  1-2 days      Consultants:  None  Procedures:  None  Antibiotics: Anti-infectives    Start     Dose/Rate Route Frequency Ordered Stop   07/10/16 1000  ceFEPIme (MAXIPIME) 1 g in dextrose 5 % 50 mL IVPB     1 g 100 mL/hr over 30 Minutes Intravenous Every 24 hours 07/10/16 0818     07/09/16 2200  vancomycin (VANCOCIN) IVPB 1000 mg/200 mL premix  Status:  Discontinued     1,000 mg 200 mL/hr over 60 Minutes Intravenous Every 48 hours 07/07/16 2122 07/09/16 1510   07/08/16 2200  ceFEPIme (MAXIPIME) 500 mg in dextrose 5 % 50 mL IVPB  Status:  Discontinued     500 mg 100 mL/hr over 30 Minutes Intravenous Every 24 hours 07/07/16 2122 07/10/16 0818   07/07/16 2030  ceFEPIme (MAXIPIME) 2 g in dextrose 5 % 50 mL IVPB  Status:  Discontinued  2 g 100 mL/hr over 30 Minutes Intravenous  Once 07/07/16 2017 07/07/16 2017   07/07/16 2030  ceFEPIme (MAXIPIME) 2 g in dextrose 5 % 50 mL IVPB     2 g 100 mL/hr over 30 Minutes Intravenous  Once 07/07/16 2017 07/07/16 2052   07/07/16 2015  aztreonam (AZACTAM) 2 g in dextrose 5 % 50 mL IVPB  Status:  Discontinued     2 g 100 mL/hr over 30 Minutes Intravenous  Once  07/07/16 2011 07/07/16 2017   07/07/16 2015  vancomycin (VANCOCIN) IVPB 1000 mg/200 mL premix     1,000 mg 200 mL/hr over 60 Minutes Intravenous  Once 07/07/16 2011 07/07/16 2118         HPI/Subjective: Denies cp,sob   Objective: Vitals:   07/09/16 2300 07/10/16 0351 07/10/16 0400 07/10/16 0749  BP: (!) 114/58  129/68 140/65  Pulse:    70  Resp: 19  (!) 27 (!) 21  Temp: 97.7 F (36.5 C)  98.9 F (37.2 C) 98.8 F (37.1 C)  TempSrc: Oral  Oral Oral  SpO2: 94%  96% 96%  Weight:  83.4 kg (183 lb 13.8 oz)    Height:        Intake/Output Summary (Last 24 hours) at 07/10/16 4782 Last data filed at 07/10/16 0600  Gross per 24 hour  Intake           3052.5 ml  Output             1525 ml  Net           1527.5 ml    Exam:  Examination:  General exam: Appears calm and comfortable  Respiratory system: Clear to auscultation. Respiratory effort normal. Cardiovascular system: S1 & S2 heard, RRR. No JVD, murmurs, rubs, gallops or clicks. No pedal edema. Gastrointestinal system: Abdomen is nondistended, soft and nontender. No organomegaly or masses felt. Normal bowel sounds heard. Central nervous system: Alert and oriented. No focal neurological deficits. Extremities: Symmetric 5 x 5 power. Skin: No rashes, lesions or ulcers Psychiatry: Judgement and insight appear normal. Mood & affect appropriate.     Data Reviewed: I have personally reviewed following labs and imaging studies  Micro Results Recent Results (from the past 240 hour(s))  Blood Culture (routine x 2)     Status: Abnormal   Collection Time: 07/07/16  7:58 PM  Result Value Ref Range Status   Specimen Description BLOOD RIGHT ARM  Final   Special Requests BOTTLES DRAWN AEROBIC AND ANAEROBIC 5 CC  Final   Culture  Setup Time   Final    GRAM NEGATIVE RODS IN BOTH AEROBIC AND ANAEROBIC BOTTLES CRITICAL RESULT CALLED TO, READ BACK BY AND VERIFIED WITH: C. Welles Pharm.D. 12:15 07/08/16  ([email or web address])    Culture (A)   Final    SALMONELLA SPECIES Results Called to: A. AGUSTIN RN, AT [phone] 07/10/16 BY D. VANHOOK [facility] CONFIRMATION    Report Status 07/10/2016 FINAL  Final   Organism ID, Bacteria SALMONELLA SPECIES  Final      Susceptibility   Salmonella species - MIC*    AMPICILLIN <=2 SENSITIVE Sensitive     LEVOFLOXACIN <=0.12 SENSITIVE Sensitive     TRIMETH/SULFA <=20 SENSITIVE Sensitive     * SALMONELLA SPECIES  Blood Culture ID Panel (Reflexed)     Status: Abnormal   Collection Time: 07/07/16  7:58 PM  Result Value Ref Range Status   Enterococcus species NOT DETECTED NOT DETECTED Final  Listeria monocytogenes NOT DETECTED NOT DETECTED Final   Staphylococcus species NOT DETECTED NOT DETECTED Final   Staphylococcus aureus NOT DETECTED NOT DETECTED Final   Streptococcus species NOT DETECTED NOT DETECTED Final   Streptococcus agalactiae NOT DETECTED NOT DETECTED Final   Streptococcus pneumoniae NOT DETECTED NOT DETECTED Final   Streptococcus pyogenes NOT DETECTED NOT DETECTED Final   Acinetobacter baumannii NOT DETECTED NOT DETECTED Final   Enterobacteriaceae species DETECTED (A) NOT DETECTED Final    Comment: CRITICAL RESULT CALLED TO, READ BACK BY AND VERIFIED WITH: C. Welles Pharm.D. 12:15 07/08/16 ([email or web address])    Enterobacter cloacae complex NOT DETECTED NOT DETECTED Final   Escherichia coli NOT DETECTED NOT DETECTED Final   Klebsiella oxytoca NOT DETECTED NOT DETECTED Final   Klebsiella pneumoniae NOT DETECTED NOT DETECTED Final   Proteus species NOT DETECTED NOT DETECTED Final   Serratia marcescens NOT DETECTED NOT DETECTED Final   Carbapenem resistance NOT DETECTED NOT DETECTED Final   Haemophilus influenzae NOT DETECTED NOT DETECTED Final   Neisseria meningitidis NOT DETECTED NOT DETECTED Final   Pseudomonas aeruginosa NOT DETECTED NOT DETECTED Final   Candida albicans NOT DETECTED NOT DETECTED Final   Candida glabrata NOT DETECTED NOT DETECTED Final   Candida krusei NOT  DETECTED NOT DETECTED Final   Candida parapsilosis NOT DETECTED NOT DETECTED Final   Candida tropicalis NOT DETECTED NOT DETECTED Final  Blood Culture (routine x 2)     Status: Abnormal   Collection Time: 07/07/16  8:08 PM  Result Value Ref Range Status   Specimen Description BLOOD RIGHT HAND  Final   Special Requests BOTTLES DRAWN AEROBIC AND ANAEROBIC 5 CC  Final   Culture  Setup Time   Final    GRAM NEGATIVE RODS IN BOTH AEROBIC AND ANAEROBIC BOTTLES CRITICAL VALUE NOTED.  VALUE IS CONSISTENT WITH PREVIOUSLY REPORTED AND CALLED VALUE.    Culture (A)  Final    SALMONELLA SPECIES Results Called to: A. AGUSTIN RN, AT [phone] 07/10/16 BY D. VANHOOK SUSCEPTIBILITIES PERFORMED ON PREVIOUS CULTURE WITHIN THE LAST 5 DAYS.    Report Status 07/10/2016 FINAL  Final  Urine culture     Status: None   Collection Time: 07/07/16  8:59 PM  Result Value Ref Range Status   Specimen Description URINE, RANDOM  Final   Special Requests NONE  Final   Culture NO GROWTH  Final   Report Status 07/09/2016 FINAL  Final  Respiratory Panel by PCR     Status: None   Collection Time: 07/08/16 12:14 AM  Result Value Ref Range Status   Adenovirus NOT DETECTED NOT DETECTED Final   Coronavirus 229E NOT DETECTED NOT DETECTED Final   Coronavirus HKU1 NOT DETECTED NOT DETECTED Final   Coronavirus NL63 NOT DETECTED NOT DETECTED Final   Coronavirus OC43 NOT DETECTED NOT DETECTED Final   Metapneumovirus NOT DETECTED NOT DETECTED Final   Rhinovirus / Enterovirus NOT DETECTED NOT DETECTED Final   Influenza A NOT DETECTED NOT DETECTED Final   Influenza B NOT DETECTED NOT DETECTED Final   Parainfluenza Virus 1 NOT DETECTED NOT DETECTED Final   Parainfluenza Virus 2 NOT DETECTED NOT DETECTED Final   Parainfluenza Virus 3 NOT DETECTED NOT DETECTED Final   Parainfluenza Virus 4 NOT DETECTED NOT DETECTED Final   Respiratory Syncytial Virus NOT DETECTED NOT DETECTED Final   Bordetella pertussis NOT DETECTED NOT DETECTED  Final   Chlamydophila pneumoniae NOT DETECTED NOT DETECTED Final   Mycoplasma pneumoniae NOT DETECTED NOT DETECTED Final  C difficile  quick scan w PCR reflex     Status: None   Collection Time: 07/08/16  1:28 AM  Result Value Ref Range Status   C Diff antigen NEGATIVE NEGATIVE Final   C Diff toxin NEGATIVE NEGATIVE Final   C Diff interpretation No C. difficile detected.  Final  Culture, expectorated sputum-assessment     Status: None   Collection Time: 07/08/16  1:28 AM  Result Value Ref Range Status   Specimen Description EXPECTORATED SPUTUM  Final   Special Requests NONE  Final   Sputum evaluation   Final    MICROSCOPIC FINDINGS SUGGEST THAT THIS SPECIMEN IS NOT REPRESENTATIVE OF LOWER RESPIRATORY SECRETIONS. PLEASE RECOLLECT. Gram Stain Report Called to,Read Back By and Verified With: RN A.MCKEOWN [phone] MLM    Report Status 07/08/2016 FINAL  Final  MRSA PCR Screening     Status: None   Collection Time: 07/08/16  4:13 PM  Result Value Ref Range Status   MRSA by PCR NEGATIVE NEGATIVE Final    Comment:        The GeneXpert MRSA Assay (FDA approved for NASAL specimens only), is one component of a comprehensive MRSA colonization surveillance program. It is not intended to diagnose MRSA infection nor to guide or monitor treatment for MRSA infections.     Radiology Reports Dg Chest Port 1 View  Result Date: 07/07/2016 CLINICAL DATA:  Shortness of breath and sepsis EXAM: PORTABLE CHEST 1 VIEW COMPARISON:  Chest CT 07/16/2014 FINDINGS: Examination is degraded by patient motion. There is atherosclerotic calcification within the aortic arch. Cardiomediastinal contours are otherwise unremarkable. There are bibasilar opacities, right greater than left. No pneumothorax or sizable pleural effusion. IMPRESSION: Motion degraded examination. Within that limitation, there are bibasilar opacities, right worse than left, which may indicate atelectasis or developing consolidation. Aortic  atherosclerosis. Electronically Signed   By: Ulyses Jarred M.D.   On: 07/07/2016 22:02     CBC  Recent Labs Lab 07/07/16 2015 07/08/16 0250 07/09/16 0300 07/10/16 0228  WBC 11.0* 11.9* 6.6 4.6  HGB 8.5* 8.5* 7.1* 8.3*  HCT 27.9* 28.1* 23.7* 26.7*  PLT 353 311 294 261  MCV 77.5* 78.1 78.2 77.4*  MCH 23.6* 23.6* 23.4* 24.1*  MCHC 30.5 30.2 30.0 31.1  RDW 15.5 15.4 15.5 15.5  LYMPHSABS 1.1  --   --   --   MONOABS 0.9  --   --   --   EOSABS 0.0  --   --   --   BASOSABS 0.0  --   --   --     Chemistries   Recent Labs Lab 07/07/16 2015 07/08/16 0250 07/09/16 0300 07/10/16 0228  NA 136 138 140 141  K 3.6 3.4* 3.6 3.4*  CL 102 108 113* 115*  CO2 19* 17* 19* 18*  GLUCOSE 122* 114* 114* 140*  BUN 56* 57* 61* 58*  CREATININE 7.00* 5.60* 3.37* 2.24*  CALCIUM 9.2 8.7* 7.8* 8.0*  AST 52*  --  75*  --   ALT 31  --  31  --   ALKPHOS 41  --  33*  --   BILITOT 0.2*  --  0.5  --    ------------------------------------------------------------------------------------------------------------------ estimated creatinine clearance is 33.7 mL/min (by C-G formula based on SCr of 2.24 mg/dL (H)). ------------------------------------------------------------------------------------------------------------------ No results for input(s): HGBA1C in the last 72 hours. ------------------------------------------------------------------------------------------------------------------ No results for input(s): CHOL, HDL, LDLCALC, TRIG, CHOLHDL, LDLDIRECT in the last 72 hours. ------------------------------------------------------------------------------------------------------------------ No results for input(s): TSH, T4TOTAL, T3FREE, THYROIDAB in  the last 72 hours.  Invalid input(s): FREET3 ------------------------------------------------------------------------------------------------------------------ No results for input(s): VITAMINB12, FOLATE, FERRITIN, TIBC, IRON, RETICCTPCT in the last 72  hours.  Coagulation profile No results for input(s): INR, PROTIME in the last 168 hours.  No results for input(s): DDIMER in the last 72 hours.  Cardiac Enzymes No results for input(s): CKMB, TROPONINI, MYOGLOBIN in the last 168 hours.  Invalid input(s): CK ------------------------------------------------------------------------------------------------------------------ Invalid input(s): POCBNP   CBG:  Recent Labs Lab 07/10/16 0747  GLUCAP 111*       Studies: No results found.    Lab Results  Component Value Date   HGBA1C 5.6 11/01/2013   HGBA1C 5.5 12/05/2012   HGBA1C  10/14/2010    5.6 (NOTE)                                                                       According to the ADA Clinical Practice Recommendations for 2011, when HbA1c is used as a screening test:   >=6.5%   Diagnostic of Diabetes Mellitus           (if abnormal result  is confirmed)  5.7-6.4%   Increased risk of developing Diabetes Mellitus  References:Diagnosis and Classification of Diabetes Mellitus,Diabetes OVZC,5885,02(DXAJO 1):S62-S69 and Standards of Medical Care in         Diabetes - 2011,Diabetes Care,2011,34  (Suppl 1):S11-S61.   Lab Results  Component Value Date   LDLCALC 40 11/01/2013   CREATININE 2.24 (H) 07/10/2016       Scheduled Meds: . atorvastatin  10 mg Oral q1800  . calcium-vitamin D  1 tablet Oral BID WC  . ceFEPime (MAXIPIME) IV  1 g Intravenous Q24H  . chlorhexidine  15 mL Mouth/Throat Daily  . clopidogrel  75 mg Oral Q breakfast  . folic acid  1 mg Oral Daily  . heparin  5,000 Units Subcutaneous Q8H  . levETIRAcetam  1,500 mg Oral BID  . methylPREDNISolone (SOLU-MEDROL) injection  40 mg Intravenous Q12H  . multivitamin with minerals  1 tablet Oral Daily  . pantoprazole  40 mg Oral Daily  . potassium chloride  40 mEq Oral Once  . tiotropium  18 mcg Inhalation Daily   Continuous Infusions: . 0.9 % NaCl with KCl 20 mEq / L       LOS: 3 days    Time spent:  >30 MINS    [facility] Pager [phone]. If 7PM-7AM, please contact night-coverage at [email or web address], password [facility] 07/10/2016, 8:28 AM  LOS: 3 days

## 2016-07-10 NOTE — Consult Note (Addendum)
[facility] for Infectious Disease  Date of Admission:  07/07/2016  Date of Consult:  07/10/2016  Reason for Consult: Salmonella bacteremia Referring Physician: Allyson Pittman  Impression/Recommendation Salmonella bacteremia  Would Continue levaquin (aim for 2 weeks of therapy) Repeat BCx Check CT abd/pelvis for GI source.   AKI Cr improving  Comment- ID of salmonella is pending.  Can be associated with meningitis (in kids, he is not meningitic), bone infections (in sickle cell), endovascular infections. Also associated with diarrhea, stool Cx not sent.  Agree with CT imaging.   Thank you so much for this interesting consult,   [name] (pager) [phone] [email or web address]  [name] is an 65 y.o. male.  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 He was found to he hypotensive and had temp 101.1 in ED.  He was also noted to have AKI with Cr 7.0.  He was started on vanco/cefpime after CXR showed R ? L airspace opacities.  By 11-30 he was found to have 1/2 BCx salmonella, vancomycin stopped.  His anbx were changed to levaqiun today.  Denies abd pain.   Past Medical History:  Diagnosis Date  . Cerebral embolism with cerebral infarction ([place])    residual left sided weakness/notes 12/12/2013  . Chronic kidney disease (CKD), stage II (mild)    [name] 12/12/2013  . Colon polyps 10/20/2013   TUBULAR ADENOMA (4).  . COPD 05/18/2007  . Elevated PSA 05/25/2007  . High cholesterol   . Hypertension 05/18/2007  . Pneumonia    "couple times" (07/08/2016)  . Prostate cancer ([place])   . Protein-calorie malnutrition, severe ([place])    [name] 12/12/2013  . Seizures ([place]) 12/05/2013; 12/08/2013; 12/11/2013   new onset; recurrent/notes 12/12/2013  . Sepsis associated hypotension ([place]) 07/08/2016   [name] 07/08/2016  . Stroke ([place]) 10/14/2010, 11/2012   Right centrum semiovale  . Stroke  [name])    "I've had about 5" (07/08/2016)    Past Surgical History:  Procedure Laterality Date  . COLONOSCOPY W/ BIOPSIES  2015   4 polyps, tubular adenomas, pan diverticulosis, one lipoma.   . ESOPHAGOGASTRODUODENOSCOPY N/A 06/06/2014   Procedure: ESOPHAGOGASTRODUODENOSCOPY (EGD);  Surgeon: Terry Mayer, MD;  Location: [facility] ENDOSCOPY;  Service: Endoscopy;  Laterality: N/A;  . FRACTURE SURGERY    . MULTIPLE EXTRACTIONS WITH ALVEOLOPLASTY N/A 10/23/2013   Procedure: Extraction of tooth #'s 1,2,3,4,5,6,7,11,12,13,14,15,16,20,21,22,23,24,27,28,29,32 with alveoloplasty and bilateral maxillary tuberosity reductions;  Surgeon: Lenn Cal, DDS;  Location: [facility];  Service: Oral Surgery;  Laterality: N/A;  . PEG TUBE PLACEMENT  12/2012   per IR  . TIBIA FRACTURE SURGERY Right    pin  . TOE AMPUTATION Right 2000s   "middle toe"; Gangrene  . TONSILLECTOMY  1956     Allergies  Allergen Reactions  . Other Other (See Comments)    Epidural caused seizure  . Aspirin Hives  . Penicillins Hives    Medications:  Scheduled: . atorvastatin  10 mg Oral q1800  . calcium-vitamin D  1 tablet Oral BID WC  . chlorhexidine  15 mL Mouth/Throat Daily  . clopidogrel  75 mg Oral Q breakfast  . folic acid  1 mg Oral Daily  . heparin  5,000 Units Subcutaneous Q8H  . levETIRAcetam  1,500 mg Oral BID  . [START ON 07/11/2016] levofloxacin (LEVAQUIN) IV  750 mg Intravenous Q48H  . methylPREDNISolone (SOLU-MEDROL) injection  40 mg  Intravenous Q12H  . multivitamin with minerals  1 tablet Oral Daily  . pantoprazole  40 mg Oral Daily  . tiotropium  18 mcg Inhalation Daily    Abtx:  Anti-infectives    Start     Dose/Rate Route Frequency Ordered Stop   07/11/16 0900  levofloxacin (LEVAQUIN) IVPB 750 mg     750 mg 100 mL/hr over 90 Minutes Intravenous Every 48 hours 07/10/16 1159     07/10/16 1000  ceFEPIme (MAXIPIME) 1 g in dextrose 5 % 50 mL IVPB  Status:  Discontinued     1 g 100 mL/hr over 30 Minutes  Intravenous Every 24 hours 07/10/16 0818 07/10/16 1159   07/09/16 2200  vancomycin (VANCOCIN) IVPB 1000 mg/200 mL premix  Status:  Discontinued     1,000 mg 200 mL/hr over 60 Minutes Intravenous Every 48 hours 07/07/16 2122 07/09/16 1510   07/08/16 2200  ceFEPIme (MAXIPIME) 500 mg in dextrose 5 % 50 mL IVPB  Status:  Discontinued     500 mg 100 mL/hr over 30 Minutes Intravenous Every 24 hours 07/07/16 2122 07/10/16 0818   07/07/16 2030  ceFEPIme (MAXIPIME) 2 g in dextrose 5 % 50 mL IVPB  Status:  Discontinued     2 g 100 mL/hr over 30 Minutes Intravenous  Once 07/07/16 2017 07/07/16 2017   07/07/16 2030  ceFEPIme (MAXIPIME) 2 g in dextrose 5 % 50 mL IVPB     2 g 100 mL/hr over 30 Minutes Intravenous  Once 07/07/16 2017 07/07/16 2052   07/07/16 2015  aztreonam (AZACTAM) 2 g in dextrose 5 % 50 mL IVPB  Status:  Discontinued     2 g 100 mL/hr over 30 Minutes Intravenous  Once 07/07/16 2011 07/07/16 2017   07/07/16 2015  vancomycin (VANCOCIN) IVPB 1000 mg/200 mL premix     1,000 mg 200 mL/hr over 60 Minutes Intravenous  Once 07/07/16 2011 07/07/16 2118      Total days of antibiotics: 4 (vanco/cefepime --> levaquin)          Social History:  reports that he has been smoking Cigarettes.  He has a 17.00 pack-year smoking history. He has never used smokeless tobacco. He reports that he drinks alcohol. He reports that he does not use drugs.  Family History  Problem Relation Age of Onset  . Liver disease Mother   . Heart attack Mother 70  . Colon cancer Neg Hx     states he is ready to go home. denies further diarrhea. passing urine well. see HPI>   Blood pressure (!) 159/70, pulse 70, temperature 98.3 F (36.8 C), temperature source Oral, resp. rate 17, height 5' 8"  (1.727 m), weight 83.4 kg (183 lb 13.8 oz), SpO2 96 %. General appearance: alert, cooperative, fatigued and no distress Eyes: negative findings: pupils equal, round, reactive to light and accomodation Throat: normal  findings: oropharynx pink & moist without lesions or evidence of thrush Neck: no adenopathy and supple, symmetrical, trachea midline Lungs: clear to auscultation bilaterally Heart: regular rate and rhythm Abdomen: normal findings: bowel sounds normal and soft, non-tender Extremities: edema none   Results for orders placed or performed during the hospital encounter of 07/07/16 (from the past 48 hour(s))  MRSA PCR Screening     Status: None   Collection Time: 07/08/16  4:13 PM  Result Value Ref Range   MRSA by PCR NEGATIVE NEGATIVE    Comment:        The GeneXpert MRSA Assay (FDA approved for  NASAL specimens only), is one component of a comprehensive MRSA colonization surveillance program. It is not intended to diagnose MRSA infection nor to guide or monitor treatment for MRSA infections.   CBC     Status: Abnormal   Collection Time: 07/09/16  3:00 AM  Result Value Ref Range   WBC 6.6 4.0 - 10.5 K/uL   RBC 3.03 (L) 4.22 - 5.81 MIL/uL   Hemoglobin 7.1 (L) 13.0 - 17.0 g/dL   HCT 23.7 (L) 39.0 - 52.0 %   MCV 78.2 78.0 - 100.0 fL   MCH 23.4 (L) 26.0 - 34.0 pg   MCHC 30.0 30.0 - 36.0 g/dL   RDW 15.5 11.5 - 15.5 %   Platelets 294 150 - 400 K/uL  Comprehensive metabolic panel     Status: Abnormal   Collection Time: 07/09/16  3:00 AM  Result Value Ref Range   Sodium 140 135 - 145 mmol/L   Potassium 3.6 3.5 - 5.1 mmol/L   Chloride 113 (H) 101 - 111 mmol/L   CO2 19 (L) 22 - 32 mmol/L   Glucose, Bld 114 (H) 65 - 99 mg/dL   BUN 61 (H) 6 - 20 mg/dL   Creatinine, Ser 3.37 (H) 0.61 - 1.24 mg/dL    Comment: DELTA CHECK NOTED   Calcium 7.8 (L) 8.9 - 10.3 mg/dL   Total Protein 6.7 6.5 - 8.1 g/dL   Albumin 2.5 (L) 3.5 - 5.0 g/dL   AST 75 (H) 15 - 41 U/L   ALT 31 17 - 63 U/L   Alkaline Phosphatase 33 (L) 38 - 126 U/L   Total Bilirubin 0.5 0.3 - 1.2 mg/dL   GFR calc non Af Amer 17 (L) >60 mL/min   GFR calc Af Amer 20 (L) >60 mL/min    Comment: (NOTE) The eGFR has been calculated  using the CKD EPI equation. This calculation has not been validated in all clinical situations. eGFR's persistently <60 mL/min signify possible Chronic Kidney Disease.    Anion gap 8 5 - 15  Prepare RBC     Status: None   Collection Time: 07/09/16  7:53 AM  Result Value Ref Range   Order Confirmation ORDER PROCESSED BY BLOOD BANK   Type and screen [place]     Status: None   Collection Time: 07/09/16  8:04 AM  Result Value Ref Range   ABO/RH(D) B POS    Antibody Screen NEG    Sample Expiration 07/12/2016    Unit Number [id number]    Blood Component Type RED CELLS,LR    Unit division 00    Status of Unit ISSUED,FINAL    Transfusion Status OK TO TRANSFUSE    Crossmatch Result Compatible   Occult blood card to lab, stool     Status: None   Collection Time: 07/09/16 11:36 PM  Result Value Ref Range   Fecal Occult Bld NEGATIVE NEGATIVE  CBC     Status: Abnormal   Collection Time: 07/10/16  2:28 AM  Result Value Ref Range   WBC 4.6 4.0 - 10.5 K/uL   RBC 3.45 (L) 4.22 - 5.81 MIL/uL   Hemoglobin 8.3 (L) 13.0 - 17.0 g/dL   HCT 26.7 (L) 39.0 - 52.0 %   MCV 77.4 (L) 78.0 - 100.0 fL   MCH 24.1 (L) 26.0 - 34.0 pg   MCHC 31.1 30.0 - 36.0 g/dL   RDW 15.5 11.5 - 15.5 %   Platelets 261 150 - 400 K/uL  Basic metabolic  panel     Status: Abnormal   Collection Time: 07/10/16  2:28 AM  Result Value Ref Range   Sodium 141 135 - 145 mmol/L   Potassium 3.4 (L) 3.5 - 5.1 mmol/L   Chloride 115 (H) 101 - 111 mmol/L   CO2 18 (L) 22 - 32 mmol/L   Glucose, Bld 140 (H) 65 - 99 mg/dL   BUN 58 (H) 6 - 20 mg/dL   Creatinine, Ser 2.24 (H) 0.61 - 1.24 mg/dL    Comment: DELTA CHECK NOTED   Calcium 8.0 (L) 8.9 - 10.3 mg/dL   GFR calc non Af Amer 29 (L) >60 mL/min   GFR calc Af Amer 33 (L) >60 mL/min    Comment: (NOTE) The eGFR has been calculated using the CKD EPI equation. This calculation has not been validated in all clinical situations. eGFR's persistently <60 mL/min  signify possible Chronic Kidney Disease.    Anion gap 8 5 - 15  Glucose, capillary     Status: Abnormal   Collection Time: 07/10/16  7:47 AM  Result Value Ref Range   Glucose-Capillary 111 (H) 65 - 99 mg/dL      Component Value Date/Time   SDES EXPECTORATED SPUTUM 07/08/2016 0128   SPECREQUEST NONE 07/08/2016 0128   CULT NO GROWTH 07/07/2016 2059   REPTSTATUS 07/08/2016 FINAL 07/08/2016 0128   No results found. Recent Results (from the past 240 hour(s))  Blood Culture (routine x 2)     Status: Abnormal   Collection Time: 07/07/16  7:58 PM  Result Value Ref Range Status   Specimen Description BLOOD RIGHT ARM  Final   Special Requests BOTTLES DRAWN AEROBIC AND ANAEROBIC 5 CC  Final   Culture  Setup Time   Final    GRAM NEGATIVE RODS IN BOTH AEROBIC AND ANAEROBIC BOTTLES CRITICAL RESULT CALLED TO, READ BACK BY AND VERIFIED WITH: C. Welles Pharm.D. 12:15 07/08/16  ([email or web address])    Culture (A)  Final    SALMONELLA SPECIES Results Called to: A. AGUSTIN RN, AT [phone] 07/10/16 BY D. VANHOOK [facility]ENT [facility] FOR CONFIRMATION    Report Status 07/10/2016 FINAL  Final   Organism ID, Bacteria SALMONELLA SPECIES  Final      Susceptibility   Salmonella species - MIC*    AMPICILLIN <=2 SENSITIVE Sensitive     LEVOFLOXACIN <=0.12 SENSITIVE Sensitive     TRIMETH/SULFA <=20 SENSITIVE Sensitive     * SALMONELLA SPECIES  Blood Culture ID Panel (Reflexed)     Status: Abnormal   Collection Time: 07/07/16  7:58 PM  Result Value Ref Range Status   Enterococcus species NOT DETECTED NOT DETECTED Final   Listeria monocytogenes NOT DETECTED NOT DETECTED Final   Staphylococcus species NOT DETECTED NOT DETECTED Final   Staphylococcus aureus NOT DETECTED NOT DETECTED Final   Streptococcus species NOT DETECTED NOT DETECTED Final   Streptococcus agalactiae NOT DETECTED NOT DETECTED Final   Streptococcus pneumoniae NOT DETECTED NOT DETECTED Final   Streptococcus pyogenes NOT DETECTED NOT DETECTED Final    Acinetobacter baumannii NOT DETECTED NOT DETECTED Final   Enterobacteriaceae species DETECTED (A) NOT DETECTED Final    Comment: CRITICAL RESULT CALLED TO, READ BACK BY AND VERIFIED WITH: C. Welles Pharm.D. 12:15 07/08/16 ([email or web address])    Enterobacter cloacae complex NOT DETECTED NOT DETECTED Final   Escherichia coli NOT DETECTED NOT DETECTED Final   Klebsiella oxytoca NOT DETECTED NOT DETECTED Final   Klebsiella pneumoniae NOT DETECTED NOT DETECTED Final   Proteus species NOT DETECTED  NOT DETECTED Final   Serratia marcescens NOT DETECTED NOT DETECTED Final   Carbapenem resistance NOT DETECTED NOT DETECTED Final   Haemophilus influenzae NOT DETECTED NOT DETECTED Final   Neisseria meningitidis NOT DETECTED NOT DETECTED Final   Pseudomonas aeruginosa NOT DETECTED NOT DETECTED Final   Candida albicans NOT DETECTED NOT DETECTED Final   Candida glabrata NOT DETECTED NOT DETECTED Final   Candida krusei NOT DETECTED NOT DETECTED Final   Candida parapsilosis NOT DETECTED NOT DETECTED Final   Candida tropicalis NOT DETECTED NOT DETECTED Final  Blood Culture (routine x 2)     Status: Abnormal   Collection Time: 07/07/16  8:08 PM  Result Value Ref Range Status   Specimen Description BLOOD RIGHT HAND  Final   Special Requests BOTTLES DRAWN AEROBIC AND ANAEROBIC 5 CC  Final   Culture  Setup Time   Final    GRAM NEGATIVE RODS IN BOTH AEROBIC AND ANAEROBIC BOTTLES CRITICAL VALUE NOTED.  VALUE IS CONSISTENT WITH PREVIOUSLY REPORTED AND CALLED VALUE.    Culture (A)  Final    SALMONELLA SPECIES Results Called to: A. AGUSTIN RN, AT [phone] 07/10/16 BY D. VANHOOK SUSCEPTIBILITIES PERFORMED ON PREVIOUS CULTURE WITHIN THE LAST 5 DAYS.    Report Status 07/10/2016 FINAL  Final  Urine culture     Status: None   Collection Time: 07/07/16  8:59 PM  Result Value Ref Range Status   Specimen Description URINE, RANDOM  Final   Special Requests NONE  Final   Culture NO GROWTH  Final   Report Status 07/09/2016  FINAL  Final  Respiratory Panel by PCR     Status: None   Collection Time: 07/08/16 12:14 AM  Result Value Ref Range Status   Adenovirus NOT DETECTED NOT DETECTED Final   Coronavirus 229E NOT DETECTED NOT DETECTED Final   Coronavirus HKU1 NOT DETECTED NOT DETECTED Final   Coronavirus NL63 NOT DETECTED NOT DETECTED Final   Coronavirus OC43 NOT DETECTED NOT DETECTED Final   Metapneumovirus NOT DETECTED NOT DETECTED Final   Rhinovirus / Enterovirus NOT DETECTED NOT DETECTED Final   Influenza A NOT DETECTED NOT DETECTED Final   Influenza B NOT DETECTED NOT DETECTED Final   Parainfluenza Virus 1 NOT DETECTED NOT DETECTED Final   Parainfluenza Virus 2 NOT DETECTED NOT DETECTED Final   Parainfluenza Virus 3 NOT DETECTED NOT DETECTED Final   Parainfluenza Virus 4 NOT DETECTED NOT DETECTED Final   Respiratory Syncytial Virus NOT DETECTED NOT DETECTED Final   Bordetella pertussis NOT DETECTED NOT DETECTED Final   Chlamydophila pneumoniae NOT DETECTED NOT DETECTED Final   Mycoplasma pneumoniae NOT DETECTED NOT DETECTED Final  C difficile quick scan w PCR reflex     Status: None   Collection Time: 07/08/16  1:28 AM  Result Value Ref Range Status   C Diff antigen NEGATIVE NEGATIVE Final   C Diff toxin NEGATIVE NEGATIVE Final   C Diff interpretation No C. difficile detected.  Final  Culture, expectorated sputum-assessment     Status: None   Collection Time: 07/08/16  1:28 AM  Result Value Ref Range Status   Specimen Description EXPECTORATED SPUTUM  Final   Special Requests NONE  Final   Sputum evaluation   Final    MICROSCOPIC FINDINGS SUGGEST THAT THIS SPECIMEN IS NOT REPRESENTATIVE OF LOWER RESPIRATORY SECRETIONS. PLEASE RECOLLECT. Gram Stain Report Called to,Read Back By and Verified With: RN A.MCKEOWN [id number] MLM    Report Status 07/08/2016 FINAL  Final  MRSA PCR Screening  Status: None   Collection Time: 07/08/16  4:13 PM  Result Value Ref Range Status   MRSA by PCR NEGATIVE  NEGATIVE Final    Comment:        The GeneXpert MRSA Assay (FDA approved for NASAL specimens only), is one component of a comprehensive MRSA colonization surveillance program. It is not intended to diagnose MRSA infection nor to guide or monitor treatment for MRSA infections.       07/10/2016, 2:58 PM     LOS: 3 days    Records and images were personally reviewed where available.

## 2016-07-10 NOTE — Progress Notes (Signed)
Pharmacy Antibiotic Note [name] is a 65 y.o. male admitted on 07/07/2016 with sepsis and AKI. Currently on day 3 of Cefepime  SCr continues to improve down to 2.2 <  3.3 < 5.6. WBC decreasing and fever curve improved. BCx's have finalized as Salmonella species that is pan S.  Plan: 1. Transition to Levaquin 750 mg IV every 48 hours starting on 12/2 as already received Cefepime dose for today (q 24 hour interval) 2. Follow up renal function and clinical course and adjust dose/change to PO as feasible   Height: 5\' 8"  (172.7 cm) Weight: 183 lb 13.8 oz (83.4 kg) IBW/kg (Calculated) : 68.4  Temp (24hrs), Avg:98.3 F (36.8 C), Min:97.7 F (36.5 C), Max:98.9 F (37.2 C)   Recent Labs Lab 07/07/16 2015 07/07/16 2016 07/07/16 2341 07/07/16 2358 07/08/16 0250 07/09/16 0300 07/10/16 0228  WBC 11.0*  --   --   --  11.9* 6.6 4.6  CREATININE 7.00*  --   --   --  5.60* 3.37* 2.24*  LATICACIDVEN  --  1.63 1.5 1.63 1.1  --   --     Estimated Creatinine Clearance: 33.7 mL/min (by C-G formula based on SCr of 2.24 mg/dL (H)).    Allergies  Allergen Reactions  . Other Other (See Comments)    Epidural caused seizure  . Aspirin Hives  . Penicillins Hives   Antimicrobials this admission: Vanc 11/28>>11/30 Cefepime 11/28>>12/1  Levaquin 12/1 >>   Dose adjustments this admission:  n/a  Microbiology results:  11/28 BCx: Salmonella pan S 11/28 UCx: ngF  11/29 Sputum: recollect  11/29 MRSA PCR: negative  11/29: C.diff negative    Thank you for allowing pharmacy to be a part of this patient's care.  Vincenza Hews, PharmD, BCPS 07/10/2016, 12:02 PM Pager: [phone]

## 2016-07-11 ENCOUNTER — Inpatient Hospital Stay (HOSPITAL_COMMUNITY): Payer: Medicare Other

## 2016-07-11 DIAGNOSIS — A09 Infectious gastroenteritis and colitis, unspecified: Secondary | ICD-10-CM

## 2016-07-11 DIAGNOSIS — A419 Sepsis, unspecified organism: Secondary | ICD-10-CM

## 2016-07-11 DIAGNOSIS — N179 Acute kidney failure, unspecified: Secondary | ICD-10-CM

## 2016-07-11 DIAGNOSIS — N182 Chronic kidney disease, stage 2 (mild): Secondary | ICD-10-CM

## 2016-07-11 DIAGNOSIS — I10 Essential (primary) hypertension: Secondary | ICD-10-CM

## 2016-07-11 LAB — CULTURE, BLOOD (ROUTINE X 2)

## 2016-07-11 LAB — BASIC METABOLIC PANEL
Anion gap: 5 (ref 5–15)
BUN: 38 mg/dL — ABNORMAL HIGH (ref 6–20)
CALCIUM: 8.4 mg/dL — AB (ref 8.9–10.3)
CO2: 22 mmol/L (ref 22–32)
CREATININE: 1.46 mg/dL — AB (ref 0.61–1.24)
Chloride: 112 mmol/L — ABNORMAL HIGH (ref 101–111)
GFR, EST AFRICAN AMERICAN: 56 mL/min — AB (ref >60)
GFR, EST NON AFRICAN AMERICAN: 48 mL/min — AB (ref >60)
Glucose, Bld: 88 mg/dL (ref 65–99)
Potassium: 4 mmol/L (ref 3.5–5.1)
SODIUM: 139 mmol/L (ref 135–145)

## 2016-07-11 LAB — CBC
HCT: 26 % — ABNORMAL LOW (ref 39.0–52.0)
Hemoglobin: 8.4 g/dL — ABNORMAL LOW (ref 13.0–17.0)
MCH: 24.4 pg — ABNORMAL LOW (ref 26.0–34.0)
MCHC: 32.3 g/dL (ref 30.0–36.0)
MCV: 75.6 fL — ABNORMAL LOW (ref 78.0–100.0)
PLATELETS: 246 10*3/uL (ref 150–400)
RBC: 3.44 MIL/uL — ABNORMAL LOW (ref 4.22–5.81)
RDW: 15.1 % (ref 11.5–15.5)
WBC: 7.9 10*3/uL (ref 4.0–10.5)

## 2016-07-11 MED ORDER — PREDNISONE 20 MG PO TABS
40.0000 mg | ORAL_TABLET | Freq: Every day | ORAL | Status: DC
  Administered 2016-07-12 – 2016-07-13 (×2): 40 mg via ORAL
  Filled 2016-07-11 (×2): qty 2

## 2016-07-11 MED ORDER — LEVOFLOXACIN IN D5W 750 MG/150ML IV SOLN
750.0000 mg | INTRAVENOUS | Status: DC
  Administered 2016-07-12: 750 mg via INTRAVENOUS
  Filled 2016-07-11: qty 150

## 2016-07-11 NOTE — Progress Notes (Signed)
Pharmacy Antibiotic Note [name] is a 65 y.o. male admitted on 07/07/2016 with sepsis and AKI. The patient was found to have salmonella bacteremia and transitioned to Levaquin on 12/2 AM.   The patient's renal function continues to improve, SCr 1.46 << 2.24, CrCl~50-55 ml/min. Will adjust the Levaquin dose today.   Plan: 1. Adjust Levaquin to 750 mg IV every 24 hours 2. Will continue to follow renal function, culture results, LOT, and antibiotic de-escalation plans    Height: 5\' 8"  (172.7 cm) Weight: 183 lb 13.8 oz (83.4 kg) IBW/kg (Calculated) : 68.4  Temp (24hrs), Avg:98.3 F (36.8 C), Min:98 F (36.7 C), Max:98.5 F (36.9 C)   Recent Labs Lab 07/07/16 2015 07/07/16 2016 07/07/16 2341 07/07/16 2358 07/08/16 0250 07/09/16 0300 07/10/16 0228 07/11/16 0832  WBC 11.0*  --   --   --  11.9* 6.6 4.6 7.9  CREATININE 7.00*  --   --   --  5.60* 3.37* 2.24* 1.46*  LATICACIDVEN  --  1.63 1.5 1.63 1.1  --   --   --     Estimated Creatinine Clearance: 51.7 mL/min (by C-G formula based on SCr of 1.46 mg/dL (H)).    Allergies  Allergen Reactions  . Other Other (See Comments)    Epidural caused seizure  . Aspirin Hives  . Penicillins Hives   Antimicrobials this admission: Vanc 11/28>>11/30 Cefepime 11/28>>12/1  Levaquin 12/1 >>   Dose adjustments this admission:  n/a  Microbiology results:  11/28 BCx: Salmonella pan S 11/28 UCx: ngF  11/29 Sputum: recollect  11/29 MRSA PCR: negative  11/29: C.diff negative  12/1 BCx >> 12/1 GI panel >>  Thank you for allowing pharmacy to be a part of this patient's care.  Alycia Rossetti, PharmD, BCPS Clinical Pharmacist Pager: [phone] Clinical phone for 07/11/2016 from 7a-3:30p: [phone] If after 3:30p, please call main pharmacy at: [phone] 07/11/2016 1:10 PM

## 2016-07-11 NOTE — Evaluation (Signed)
Physical Therapy Evaluation Patient Details Name: [name] MRN: [id number] DOB: Oct 04, 1950 Today's Date: 07/11/2016   History of Present Illness  Pt is a 65 y/o male admitted secondary to sepsis. PMH including but not limited to multiple CVA's (2012, 2014, 2015), seizures, COPD, CKD and prostate cancer  Clinical Impression  Pt presented supine in bed with HOB elevated, awake and willing to participate in therapy session. Prior to admission, pt reported that he lived at "[facility]" (SNF) and that he performed lateral scooting transfers to get into and out of his power w/c. Pt currently requiring max A for bed mobility and progressed to min guard with no UE supports to sit EOB for approximately 10 minutes. Pt would continue to benefit from skilled physical therapy services at this time while admitted and after d/c to address his below listed limitations in order to improve his overall safety and independence with functional mobility.     Follow Up Recommendations SNF    Equipment Recommendations  None recommended by PT    Recommendations for Other Services       Precautions / Restrictions Precautions Precautions: Fall Restrictions Weight Bearing Restrictions: No      Mobility  Bed Mobility Overal bed mobility: Needs Assistance;+2 for physical assistance Bed Mobility: Supine to Sit;Sit to Supine     Supine to sit: Max assist;+2 for physical assistance Sit to supine: Max assist;+2 for physical assistance   General bed mobility comments: pt required max A x2 to achieve sitting EOB and to return to supine  Transfers                 General transfer comment: NT  Ambulation/Gait                Stairs            Wheelchair Mobility    Modified Rankin (Stroke Patients Only)       Balance Overall balance assessment: Needs assistance Sitting-balance support: Feet supported;No upper extremity supported Sitting balance-Leahy Scale: Good Sitting  balance - Comments: intially pt required max A to sustain sitting; however, with decreased assistance given pt progressing to min guard and no UE supports to sit EOB without LOB Postural control: Right lateral lean                                   Pertinent Vitals/Pain Pain Assessment: No/denies pain    Home Living Family/patient expects to be discharged to:: Skilled nursing facility [facility])                      Prior Function Level of Independence: Needs assistance   Gait / Transfers Assistance Needed: pt reported that he only performs a sliding/scoot transfer to his power w/c           Hand Dominance   Dominant Hand: Right    Extremity/Trunk Assessment   Upper Extremity Assessment: Generalized weakness ((L sided weakness from hx of CVA))           Lower Extremity Assessment: Generalized weakness;LLE deficits/detail   LLE Deficits / Details: residual weakness from prior CVA's     Communication   Communication: No difficulties  Cognition Arousal/Alertness: Awake/alert Behavior During Therapy: WFL for tasks assessed/performed Overall Cognitive Status: No family/caregiver present to determine baseline cognitive functioning  General Comments      Exercises     Assessment/Plan    PT Assessment Patient needs continued PT services  PT Problem List Decreased strength;Decreased mobility;Decreased balance;Decreased coordination;Decreased safety awareness          PT Treatment Interventions DME instruction;Functional mobility training;Therapeutic activities;Therapeutic exercise;Balance training;Neuromuscular re-education;Patient/family education    PT Goals (Current goals can be found in the Care Plan section)  Acute Rehab PT Goals Patient Stated Goal: return to [facility] PT Goal Formulation: With patient Time For Goal Achievement: 07/25/16 Potential to Achieve Goals: Fair    Frequency Min 2X/week    Barriers to discharge        Co-evaluation               End of Session   Activity Tolerance: Patient limited by fatigue Patient left: in bed;with call bell/phone within reach;with bed alarm set Nurse Communication: Mobility status         Time: [id number] PT Time Calculation (min) (ACUTE ONLY): 22 min   Charges:   PT Evaluation $PT Eval Moderate Complexity: 1 Procedure     PT G CodesClearnce Sorrel Karrah Mangini 07/11/2016, 12:29 PM Sherie Don, [place], DPT [phone]

## 2016-07-11 NOTE — Progress Notes (Signed)
INFECTIOUS DISEASE PROGRESS NOTE  ID: [name] is a 65 y.o. male with  Principal Problem:   Sepsis associated hypotension ([place]) Active Problems:   Hypertension   Acute on chronic renal failure (HCC)   Iron deficiency anemia due to chronic blood loss   Diarrhea of presumed infectious origin   CAP (community acquired pneumonia)   AKI (acute kidney injury) ([place])   HCAP (healthcare-associated pneumonia)  Subjective: Without complaints Small smear stool this AM.   Abtx:  Anti-infectives    Start     Dose/Rate Route Frequency Ordered Stop   07/11/16 0900  levofloxacin (LEVAQUIN) IVPB 750 mg     750 mg 100 mL/hr over 90 Minutes Intravenous Every 48 hours 07/10/16 1159     07/10/16 1000  ceFEPIme (MAXIPIME) 1 g in dextrose 5 % 50 mL IVPB  Status:  Discontinued     1 g 100 mL/hr over 30 Minutes Intravenous Every 24 hours 07/10/16 0818 07/10/16 1159   07/09/16 2200  vancomycin (VANCOCIN) IVPB 1000 mg/200 mL premix  Status:  Discontinued     1,000 mg 200 mL/hr over 60 Minutes Intravenous Every 48 hours 07/07/16 2122 07/09/16 1510   07/08/16 2200  ceFEPIme (MAXIPIME) 500 mg in dextrose 5 % 50 mL IVPB  Status:  Discontinued     500 mg 100 mL/hr over 30 Minutes Intravenous Every 24 hours 07/07/16 2122 07/10/16 0818   07/07/16 2030  ceFEPIme (MAXIPIME) 2 g in dextrose 5 % 50 mL IVPB  Status:  Discontinued     2 g 100 mL/hr over 30 Minutes Intravenous  Once 07/07/16 2017 07/07/16 2017   07/07/16 2030  ceFEPIme (MAXIPIME) 2 g in dextrose 5 % 50 mL IVPB     2 g 100 mL/hr over 30 Minutes Intravenous  Once 07/07/16 2017 07/07/16 2052   07/07/16 2015  aztreonam (AZACTAM) 2 g in dextrose 5 % 50 mL IVPB  Status:  Discontinued     2 g 100 mL/hr over 30 Minutes Intravenous  Once 07/07/16 2011 07/07/16 2017   07/07/16 2015  vancomycin (VANCOCIN) IVPB 1000 mg/200 mL premix     1,000 mg 200 mL/hr over 60 Minutes Intravenous  Once 07/07/16 2011 07/07/16 2118      Medications:    Scheduled: . atorvastatin  10 mg Oral q1800  . calcium-vitamin D  1 tablet Oral BID WC  . chlorhexidine  15 mL Mouth/Throat Daily  . clopidogrel  75 mg Oral Q breakfast  . folic acid  1 mg Oral Daily  . heparin  5,000 Units Subcutaneous Q8H  . levETIRAcetam  1,500 mg Oral BID  . levofloxacin (LEVAQUIN) IV  750 mg Intravenous Q48H  . methylPREDNISolone (SOLU-MEDROL) injection  40 mg Intravenous Q12H  . multivitamin with minerals  1 tablet Oral Daily  . pantoprazole  40 mg Oral Daily  . tiotropium  18 mcg Inhalation Daily    Objective: Vital signs in last 24 hours: Temp:  [98 F (36.7 C)-98.5 F (36.9 C)] 98.5 F (36.9 C) (12/02 0819) Pulse Rate:  [70-78] 78 (12/02 0524) Resp:  [17-22] 20 (12/02 0524) BP: (159-169)/(70-86) 167/85 (12/02 0819) SpO2:  [95 %-97 %] 97 % (12/02 0954)   General appearance: alert, cooperative and no distress Resp: clear to auscultation bilaterally Cardio: regular rate and rhythm GI: normal findings: bowel sounds normal and soft, non-tender  Lab Results  Recent Labs  07/10/16 0228 07/11/16 0832  WBC 4.6 7.9  HGB 8.3* 8.4*  HCT 26.7* 26.0*  NA 141 139  K 3.4* 4.0  CL 115* 112*  CO2 18* 22  BUN 58* 38*  CREATININE 2.24* 1.46*   Liver Panel  Recent Labs  07/09/16 0300  PROT 6.7  ALBUMIN 2.5*  AST 75*  ALT 31  ALKPHOS 33*  BILITOT 0.5   Sedimentation Rate No results for input(s): ESRSEDRATE in the last 72 hours. C-Reactive Protein No results for input(s): CRP in the last 72 hours.  Microbiology: Recent Results (from the past 240 hour(s))  Blood Culture (routine x 2)     Status: Abnormal   Collection Time: 07/07/16  7:58 PM  Result Value Ref Range Status   Specimen Description BLOOD RIGHT ARM  Final   Special Requests BOTTLES DRAWN AEROBIC AND ANAEROBIC 5 CC  Final   Culture  Setup Time   Final    GRAM NEGATIVE RODS IN BOTH AEROBIC AND ANAEROBIC BOTTLES CRITICAL RESULT CALLED TO, READ BACK BY AND VERIFIED WITH: C. Welles  Pharm.D. 12:15 07/08/16  ([email or web address])    Culture (A)  Final    SALMONELLA SPECIES Results Called to: A. AGUSTIN RN, AT [phone] 07/10/16 BY D. VANHOOK [facility]ENT [facility] FOR CON[facility]IRMATION    Report Status 07/10/2016 FINAL  Final   Organism ID, Bacteria SALMONELLA SPECIES  Final      Susceptibility   Salmonella species - MIC*    AMPICILLIN <=2 SENSITIVE Sensitive     LEVOFLOXACIN <=0.12 SENSITIVE Sensitive     TRIMETH/SULFA <=20 SENSITIVE Sensitive     * SALMONELLA SPECIES  Blood Culture ID Panel (Reflexed)     Status: Abnormal   Collection Time: 07/07/16  7:58 PM  Result Value Ref Range Status   Enterococcus species NOT DETECTED NOT DETECTED Final   Listeria monocytogenes NOT DETECTED NOT DETECTED Final   Staphylococcus species NOT DETECTED NOT DETECTED Final   Staphylococcus aureus NOT DETECTED NOT DETECTED Final   Streptococcus species NOT DETECTED NOT DETECTED Final   Streptococcus agalactiae NOT DETECTED NOT DETECTED Final   Streptococcus pneumoniae NOT DETECTED NOT DETECTED Final   Streptococcus pyogenes NOT DETECTED NOT DETECTED Final   Acinetobacter baumannii NOT DETECTED NOT DETECTED Final   Enterobacteriaceae species DETECTED (A) NOT DETECTED Final    Comment: CRITICAL RESULT CALLED TO, READ BACK BY AND VERIFIED WITH: C. Welles Pharm.D. 12:15 07/08/16 ([email or web address])    Enterobacter cloacae complex NOT DETECTED NOT DETECTED Final   Escherichia coli NOT DETECTED NOT DETECTED Final   Klebsiella oxytoca NOT DETECTED NOT DETECTED Final   Klebsiella pneumoniae NOT DETECTED NOT DETECTED Final   Proteus species NOT DETECTED NOT DETECTED Final   Serratia marcescens NOT DETECTED NOT DETECTED Final   Carbapenem resistance NOT DETECTED NOT DETECTED Final   Haemophilus influenzae NOT DETECTED NOT DETECTED Final   Neisseria meningitidis NOT DETECTED NOT DETECTED Final   Pseudomonas aeruginosa NOT DETECTED NOT DETECTED Final   Candida albicans NOT DETECTED NOT DETECTED Final   Candida glabrata  NOT DETECTED NOT DETECTED Final   Candida krusei NOT DETECTED NOT DETECTED Final   Candida parapsilosis NOT DETECTED NOT DETECTED Final   Candida tropicalis NOT DETECTED NOT DETECTED Final  Blood Culture (routine x 2)     Status: Abnormal   Collection Time: 07/07/16  8:08 PM  Result Value Ref Range Status   Specimen Description BLOOD RIGHT HAND  Final   Special Requests BOTTLES DRAWN AEROBIC AND ANAEROBIC 5 CC  Final   Culture  Setup Time   Final    GRAM NEGATIVE RODS  IN BOTH AEROBIC AND ANAEROBIC BOTTLES CRITICAL VALUE NOTED.  VALUE IS CONSISTENT WITH PREVIOUSLY REPORTED AND CALLED VALUE.    Culture (A)  Final    SALMONELLA SPECIES Results Called to: A. AGUSTIN RN, AT [phone] 07/10/16 BY D. VANHOOK SUSCEPTIBILITIES PERFORMED ON PREVIOUS CULTURE WITHIN THE LAST 5 DAYS.    Report Status 07/10/2016 FINAL  Final  Urine culture     Status: None   Collection Time: 07/07/16  8:59 PM  Result Value Ref Range Status   Specimen Description URINE, RANDOM  Final   Special Requests NONE  Final   Culture NO GROWTH  Final   Report Status 07/09/2016 FINAL  Final  Respiratory Panel by PCR     Status: None   Collection Time: 07/08/16 12:14 AM  Result Value Ref Range Status   Adenovirus NOT DETECTED NOT DETECTED Final   Coronavirus 229E NOT DETECTED NOT DETECTED Final   Coronavirus HKU1 NOT DETECTED NOT DETECTED Final   Coronavirus NL63 NOT DETECTED NOT DETECTED Final   Coronavirus OC43 NOT DETECTED NOT DETECTED Final   Metapneumovirus NOT DETECTED NOT DETECTED Final   Rhinovirus / Enterovirus NOT DETECTED NOT DETECTED Final   Influenza A NOT DETECTED NOT DETECTED Final   Influenza B NOT DETECTED NOT DETECTED Final   Parainfluenza Virus 1 NOT DETECTED NOT DETECTED Final   Parainfluenza Virus 2 NOT DETECTED NOT DETECTED Final   Parainfluenza Virus 3 NOT DETECTED NOT DETECTED Final   Parainfluenza Virus 4 NOT DETECTED NOT DETECTED Final   Respiratory Syncytial Virus NOT DETECTED NOT DETECTED Final     Bordetella pertussis NOT DETECTED NOT DETECTED Final   Chlamydophila pneumoniae NOT DETECTED NOT DETECTED Final   Mycoplasma pneumoniae NOT DETECTED NOT DETECTED Final  C difficile quick scan w PCR reflex     Status: None   Collection Time: 07/08/16  1:28 AM  Result Value Ref Range Status   C Diff antigen NEGATIVE NEGATIVE Final   C Diff toxin NEGATIVE NEGATIVE Final   C Diff interpretation No C. difficile detected.  Final  Culture, expectorated sputum-assessment     Status: None   Collection Time: 07/08/16  1:28 AM  Result Value Ref Range Status   Specimen Description EXPECTORATED SPUTUM  Final   Special Requests NONE  Final   Sputum evaluation   Final    MICROSCOPIC FINDINGS SUGGEST THAT THIS SPECIMEN IS NOT REPRESENTATIVE OF LOWER RESPIRATORY SECRETIONS. PLEASE RECOLLECT. Gram Stain Report Called to,Read Back By and Verified With: RN A.[email or web address] [id number] MLM    Report Status 07/08/2016 FINAL  Final  MRSA PCR Screening     Status: None   Collection Time: 07/08/16  4:13 PM  Result Value Ref Range Status   MRSA by PCR NEGATIVE NEGATIVE Final    Comment:        The GeneXpert MRSA Assay (FDA approved for NASAL specimens only), is one component of a comprehensive MRSA colonization surveillance program. It is not intended to diagnose MRSA infection nor to guide or monitor treatment for MRSA infections.     Studies/Results: Ct Abdomen Pelvis Wo Contrast  Result Date: 07/11/2016 CLINICAL DATA:  Follow-up sepsis.  Initial encounter. EXAM: CT ABDOMEN AND PELVIS WITHOUT CONTRAST TECHNIQUE: Multidetector CT imaging of the abdomen and pelvis was performed following the standard protocol without IV contrast. COMPARISON:  Renal ultrasound performed 07/16/2014 FINDINGS: Lower chest: Trace bilateral pleural fluid is noted, with associated atelectasis. Scattered coronary artery calcifications are seen. Hepatobiliary: The liver is unremarkable in appearance. The  gallbladder is not  definitely characterized. The common bile duct remains normal in caliber. Pancreas: The pancreas is within normal limits. Spleen: The spleen is unremarkable in appearance. Adrenals/Urinary Tract: The adrenal glands are unremarkable in appearance. The kidneys are within normal limits. There is no evidence of hydronephrosis. No renal or ureteral stones are identified. No perinephric stranding is seen. Stomach/Bowel: The stomach is unremarkable in appearance. The small bowel is within normal limits. The appendix is normal in caliber, without evidence of appendicitis. Mild diverticulosis is noted along the ascending, descending and sigmoid colon, without evidence of diverticulitis. Vascular/Lymphatic: Diffuse calcification is seen along the abdominal aorta and its branches. The abdominal aorta is otherwise grossly unremarkable. The inferior vena cava is grossly unremarkable. No retroperitoneal lymphadenopathy is seen. No pelvic sidewall lymphadenopathy is identified. Reproductive: The bladder is mildly distended and grossly unremarkable. The prostate remains normal in size. Other: Trace free fluid is noted within the pelvis. Musculoskeletal: No acute osseous abnormalities are identified. A right femoral pin is partially imaged. The visualized musculature is unremarkable in appearance. IMPRESSION: 1. No acute abnormality seen to explain the patient's symptoms. 2. Trace bilateral pleural fluid, with associated atelectasis. 3. Scattered coronary artery calcifications seen. 4. Diffuse aortic atherosclerosis. 5. Mild diverticulosis along the ascending, descending and sigmoid colon, without evidence of diverticulitis. 6. Trace ascites within the pelvis. Electronically Signed   By: Garald Balding M.D.   On: 07/11/2016 04:00     Assessment/Plan: Salmonella bacteremia  Would Continue levaquin (aim for 2 weeks of therapy) Repeat BCx pending CT abd/pelvis does not show source.  Available as needed.   AKI Cr  improving  Day 5 anbx (vanco/cefepime --> levaquin)         Bobby Rumpf Infectious Diseases (pager) [phone] [email or web address] 07/11/2016, 11:20 AM  LOS: 4 days

## 2016-07-11 NOTE — Progress Notes (Addendum)
PROGRESS NOTE  [name]  HTD:[id number] DOB: 08-30-1950  DOA: 07/07/2016 PCP: Wenda Low, MD   Brief Narrative:  65 year old male with PMH of CVA, stage II chronic kidney disease, COPD, HTN, prostate cancer with? Bone metastases, MGUS, chronic anemia, SNF resident, presented to ED on 07/08/16 with complaints of decreased activity and less communicative, noted initially to be febrile and hypotensive with SBP of 70. He had productive cough and diarrhea on admission. Met sepsis criteria on admission and treated per sepsis protocol for initial presumed bibasilar pneumonia versus infectious diarrhea versus UTI.   Assessment & Plan:   Principal Problem:   Sepsis associated hypotension (HCC) Active Problems:   Hypertension   Acute on chronic renal failure (HCC)   Iron deficiency anemia due to chronic blood loss   Diarrhea of presumed infectious origin   CAP (community acquired pneumonia)   AKI (acute kidney injury) ([place])   HCAP (healthcare-associated pneumonia)   1. Sepsis secondary to Salmonella bacteremia: Initially felt to have sepsis due to possible pneumonia versus infectious diarrhea versus UTI. Met sepsis criteria on admission. Treated per sepsis protocol with IV fluids, empiric IV vancomycin and Zosyn. Blood cultures 211/28/17 and then confirmed salmonella. Urine culture negative. RSV panel negative. C. difficile testing negative. Flu panel PCR negative. ID was then consulted on 07/10/16. Recommended continuing levofloxacin and aim for 2 weeks of therapy, repeat/surveillance blood culture and obtain CT abdomen and pelvis >no obvious source for infection. Transitioned to levofloxacin. Sepsis physiology resolved. 2. 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 Presented with creatinine of 7. Acute kidney injury secondary to dehydration and sepsis. Hydrated and clinically improved. Creatinine 1.46 on 12/2. Continue monitoring BMP. ACEI/HCTZ held. 3. Essential  hypertension: Mildly uncontrolled. Antihypertensives were initially held due to sepsis and hypotension. Continue when necessary IV hydralazine. Consider starting amlodipine if persistently elevated. 4. Anemia: Stable. 5. COPD exacerbation: Started on IV steroids on 11/29. Exacerbation has resolved. Transitioned to oral prednisone with rapid taper. 6. Dyslipidemia: Continue statins. 7. Self-limiting diarrhea: Resolved. C. difficile testing negative.? Cause of Salmonella bacteremia. Requested RN on 12/2 to send for stool for GI pathogen panel PCR. 8. History of CVA: Continue Plavix. 9. Prostate cancer with? Bone metastases/MGUS: Outpatient follow-up. 10. Seizure disorder: Continue Keppra at current dose since renal functions are improving.   DVT prophylaxis: Subcutaneous heparin Code Status: DO NOT RESUSCITATE Family Communication: None at bedside Disposition Plan: DC to SNF when medically stable.   Consultants:   Infectious disease  Procedures:   None  Antimicrobials:   IV cefepime 11/28 > 12/1  IV vancomycin 1 dose on 11/28  IV levofloxacin 12/2 >    Subjective: Feels better. Diarrhea resolved. Denies any other complaints. As per RN, no acute issues.  Objective:  Vitals:   07/11/16 0400 07/11/16 0524 07/11/16 0819 07/11/16 0954  BP: (!) 159/76 (!) 169/81 (!) 167/85   Pulse:  78    Resp: (!) 22 20    Temp:  98 F (36.7 C) 98.5 F (36.9 C)   TempSrc:  Oral Oral   SpO2: 95% 97%  97%  Weight:      Height:        Intake/Output Summary (Last 24 hours) at 07/11/16 1258 Last data filed at 07/11/16 0600  Gross per 24 hour  Intake             2190 ml  Output             1775 ml  Net  415 ml   Filed Weights   07/08/16 1615 07/09/16 0339 07/10/16 0351  Weight: 82.8 kg (182 lb 8.7 oz) 83.8 kg (184 lb 11.9 oz) 83.4 kg (183 lb 13.8 oz)    Examination:  General exam: Pleasant middle-aged male, chronically ill-looking, lying comfortably propped up in  bed. Respiratory system: Clear to auscultation. Respiratory effort normal. Cardiovascular system: S1 & S2 heard, RRR. No JVD, murmurs, rubs, gallops or clicks. No pedal edema. Telemetry: Sinus rhythm. Gastrointestinal system: Abdomen is nondistended, soft and nontender. No organomegaly or masses felt. Normal bowel sounds heard. Central nervous system: Alert and oriented to self and partly to place. No focal neurological deficits. Extremities: Symmetric 5 x 5 power. Skin: No rashes, lesions or ulcers Psychiatry: Judgement and insight appear impaired. Mood & affect appropriate.     Data Reviewed: I have personally reviewed following labs and imaging studies  CBC:  Recent Labs Lab 07/07/16 2015 07/08/16 0250 07/09/16 0300 07/10/16 0228 07/11/16 0832  WBC 11.0* 11.9* 6.6 4.6 7.9  NEUTROABS 9.0*  --   --   --   --   HGB 8.5* 8.5* 7.1* 8.3* 8.4*  HCT 27.9* 28.1* 23.7* 26.7* 26.0*  MCV 77.5* 78.1 78.2 77.4* 75.6*  PLT 353 311 294 261 268   Basic Metabolic Panel:  Recent Labs Lab 07/07/16 2015 07/08/16 0250 07/09/16 0300 07/10/16 0228 07/11/16 0832  NA 136 138 140 141 139  K 3.6 3.4* 3.6 3.4* 4.0  CL 102 108 113* 115* 112*  CO2 19* 17* 19* 18* 22  GLUCOSE 122* 114* 114* 140* 88  BUN 56* 57* 61* 58* 38*  CREATININE 7.00* 5.60* 3.37* 2.24* 1.46*  CALCIUM 9.2 8.7* 7.8* 8.0* 8.4*   GFR: Estimated Creatinine Clearance: 51.7 mL/min (by C-G formula based on SCr of 1.46 mg/dL (H)). Liver Function Tests:  Recent Labs Lab 07/07/16 2015 07/09/16 0300  AST 52* 75*  ALT 31 31  ALKPHOS 41 33*  BILITOT 0.2* 0.5  PROT 7.9 6.7  ALBUMIN 3.3* 2.5*   No results for input(s): LIPASE, AMYLASE in the last 168 hours. No results for input(s): AMMONIA in the last 168 hours. Coagulation Profile: No results for input(s): INR, PROTIME in the last 168 hours. Cardiac Enzymes: No results for input(s): CKTOTAL, CKMB, CKMBINDEX, TROPONINI in the last 168 hours. BNP (last 3 results) No  results for input(s): PROBNP in the last 8760 hours. HbA1C: No results for input(s): HGBA1C in the last 72 hours. CBG:  Recent Labs Lab 07/10/16 0747  GLUCAP 111*   Lipid Profile: No results for input(s): CHOL, HDL, LDLCALC, TRIG, CHOLHDL, LDLDIRECT in the last 72 hours. Thyroid Function Tests: No results for input(s): TSH, T4TOTAL, FREET4, T3FREE, THYROIDAB in the last 72 hours. Anemia Panel: No results for input(s): VITAMINB12, FOLATE, FERRITIN, TIBC, IRON, RETICCTPCT in the last 72 hours.  Sepsis Labs:  Recent Labs Lab 07/07/16 2016 07/07/16 2341 07/07/16 2358 07/08/16 0250  LATICACIDVEN 1.63 1.5 1.63 1.1    Recent Results (from the past 240 hour(s))  Blood Culture (routine x 2)     Status: Abnormal   Collection Time: 07/07/16  7:58 PM  Result Value Ref Range Status   Specimen Description BLOOD RIGHT ARM  Final   Special Requests BOTTLES DRAWN AEROBIC AND ANAEROBIC 5 CC  Final   Culture  Setup Time   Final    GRAM NEGATIVE RODS IN BOTH AEROBIC AND ANAEROBIC BOTTLES CRITICAL RESULT CALLED TO, READ BACK BY AND VERIFIED WITH: C. Welles Pharm.D. 12:15 07/08/16  (  [email or web address])    Culture (A)  Final    SALMONELLA SPECIES Results Called to: A. AGUSTIN RN, AT [phone] 07/10/16 BY D. VANHOOK [facility]ENT [facility] CON[facility]IRMATION    Report Status 07/10/2016 FINAL  Final   Organism ID, Bacteria SALMONELLA SPECIES  Final      Susceptibility   Salmonella species - MIC*    AMPICILLIN <=2 SENSITIVE Sensitive     LEVOFLOXACIN <=0.12 SENSITIVE Sensitive     TRIMETH/SULFA <=20 SENSITIVE Sensitive     * SALMONELLA SPECIES  Blood Culture ID Panel (Reflexed)     Status: Abnormal   Collection Time: 07/07/16  7:58 PM  Result Value Ref Range Status   Enterococcus species NOT DETECTED NOT DETECTED Final   Listeria monocytogenes NOT DETECTED NOT DETECTED Final   Staphylococcus species NOT DETECTED NOT DETECTED Final   Staphylococcus aureus NOT DETECTED NOT DETECTED Final   Streptococcus  species NOT DETECTED NOT DETECTED Final   Streptococcus agalactiae NOT DETECTED NOT DETECTED Final   Streptococcus pneumoniae NOT DETECTED NOT DETECTED Final   Streptococcus pyogenes NOT DETECTED NOT DETECTED Final   Acinetobacter baumannii NOT DETECTED NOT DETECTED Final   Enterobacteriaceae species DETECTED (A) NOT DETECTED Final    Comment: CRITICAL RESULT CALLED TO, READ BACK BY AND VERIFIED WITH: C. Welles Pharm.D. 12:15 07/08/16 ([email or web address])    Enterobacter cloacae complex NOT DETECTED NOT DETECTED Final   Escherichia coli NOT DETECTED NOT DETECTED Final   Klebsiella oxytoca NOT DETECTED NOT DETECTED Final   Klebsiella pneumoniae NOT DETECTED NOT DETECTED Final   Proteus species NOT DETECTED NOT DETECTED Final   Serratia marcescens NOT DETECTED NOT DETECTED Final   Carbapenem resistance NOT DETECTED NOT DETECTED Final   Haemophilus influenzae NOT DETECTED NOT DETECTED Final   Neisseria meningitidis NOT DETECTED NOT DETECTED Final   Pseudomonas aeruginosa NOT DETECTED NOT DETECTED Final   Candida albicans NOT DETECTED NOT DETECTED Final   Candida glabrata NOT DETECTED NOT DETECTED Final   Candida krusei NOT DETECTED NOT DETECTED Final   Candida parapsilosis NOT DETECTED NOT DETECTED Final   Candida tropicalis NOT DETECTED NOT DETECTED Final  Blood Culture (routine x 2)     Status: Abnormal   Collection Time: 07/07/16  8:08 PM  Result Value Ref Range Status   Specimen Description BLOOD RIGHT HAND  Final   Special Requests BOTTLES DRAWN AEROBIC AND ANAEROBIC 5 CC  Final   Culture  Setup Time   Final    GRAM NEGATIVE RODS IN BOTH AEROBIC AND ANAEROBIC BOTTLES CRITICAL VALUE NOTED.  VALUE IS CONSISTENT WITH PREVIOUSLY REPORTED AND CALLED VALUE.    Culture (A)  Final    SALMONELLA SPECIES Results Called to: A. AGUSTIN RN, AT 3500 07/10/16 BY D. VANHOOK SUSCEPTIBILITIES PERFORMED ON PREVIOUS CULTURE WITHIN THE LAST 5 DAYS.    Report Status 07/10/2016 FINAL  Final  Urine culture      Status: None   Collection Time: 07/07/16  8:59 PM  Result Value Ref Range Status   Specimen Description URINE, RANDOM  Final   Special Requests NONE  Final   Culture NO GROWTH  Final   Report Status 07/09/2016 FINAL  Final  Respiratory Panel by PCR     Status: None   Collection Time: 07/08/16 12:14 AM  Result Value Ref Range Status   Adenovirus NOT DETECTED NOT DETECTED Final   Coronavirus 229E NOT DETECTED NOT DETECTED Final   Coronavirus HKU1 NOT DETECTED NOT DETECTED Final   Coronavirus NL63 NOT DETECTED  NOT DETECTED Final   Coronavirus OC43 NOT DETECTED NOT DETECTED Final   Metapneumovirus NOT DETECTED NOT DETECTED Final   Rhinovirus / Enterovirus NOT DETECTED NOT DETECTED Final   Influenza A NOT DETECTED NOT DETECTED Final   Influenza B NOT DETECTED NOT DETECTED Final   Parainfluenza Virus 1 NOT DETECTED NOT DETECTED Final   Parainfluenza Virus 2 NOT DETECTED NOT DETECTED Final   Parainfluenza Virus 3 NOT DETECTED NOT DETECTED Final   Parainfluenza Virus 4 NOT DETECTED NOT DETECTED Final   Respiratory Syncytial Virus NOT DETECTED NOT DETECTED Final   Bordetella pertussis NOT DETECTED NOT DETECTED Final   Chlamydophila pneumoniae NOT DETECTED NOT DETECTED Final   Mycoplasma pneumoniae NOT DETECTED NOT DETECTED Final  C difficile quick scan w PCR reflex     Status: None   Collection Time: 07/08/16  1:28 AM  Result Value Ref Range Status   C Diff antigen NEGATIVE NEGATIVE Final   C Diff toxin NEGATIVE NEGATIVE Final   C Diff interpretation No C. difficile detected.  Final  Culture, expectorated sputum-assessment     Status: None   Collection Time: 07/08/16  1:28 AM  Result Value Ref Range Status   Specimen Description EXPECTORATED SPUTUM  Final   Special Requests NONE  Final   Sputum evaluation   Final    MICROSCOPIC FINDINGS SUGGEST THAT THIS SPECIMEN IS NOT REPRESENTATIVE OF LOWER RESPIRATORY SECRETIONS. PLEASE RECOLLECT. Gram Stain Report Called to,Read Back By and  Verified With: RN A.MCKEOWN [phone] MLM    Report Status 07/08/2016 FINAL  Final  MRSA PCR Screening     Status: None   Collection Time: 07/08/16  4:13 PM  Result Value Ref Range Status   MRSA by PCR NEGATIVE NEGATIVE Final    Comment:        The GeneXpert MRSA Assay (FDA approved for NASAL specimens only), is one component of a comprehensive MRSA colonization surveillance program. It is not intended to diagnose MRSA infection nor to guide or monitor treatment for MRSA infections.          Radiology Studies: Ct Abdomen Pelvis Wo Contrast  Result Date: 07/11/2016 CLINICAL DATA:  Follow-up sepsis.  Initial encounter. EXAM: CT ABDOMEN AND PELVIS WITHOUT CONTRAST TECHNIQUE: Multidetector CT imaging of the abdomen and pelvis was performed following the standard protocol without IV contrast. COMPARISON:  Renal ultrasound performed 07/16/2014 FINDINGS: Lower chest: Trace bilateral pleural fluid is noted, with associated atelectasis. Scattered coronary artery calcifications are seen. Hepatobiliary: The liver is unremarkable in appearance. The gallbladder is not definitely characterized. The common bile duct remains normal in caliber. Pancreas: The pancreas is within normal limits. Spleen: The spleen is unremarkable in appearance. Adrenals/Urinary Tract: The adrenal glands are unremarkable in appearance. The kidneys are within normal limits. There is no evidence of hydronephrosis. No renal or ureteral stones are identified. No perinephric stranding is seen. Stomach/Bowel: The stomach is unremarkable in appearance. The small bowel is within normal limits. The appendix is normal in caliber, without evidence of appendicitis. Mild diverticulosis is noted along the ascending, descending and sigmoid colon, without evidence of diverticulitis. Vascular/Lymphatic: Diffuse calcification is seen along the abdominal aorta and its branches. The abdominal aorta is otherwise grossly unremarkable. The inferior  vena cava is grossly unremarkable. No retroperitoneal lymphadenopathy is seen. No pelvic sidewall lymphadenopathy is identified. Reproductive: The bladder is mildly distended and grossly unremarkable. The prostate remains normal in size. Other: Trace free fluid is noted within the pelvis. Musculoskeletal: No acute osseous abnormalities  are identified. A right femoral pin is partially imaged. The visualized musculature is unremarkable in appearance. IMPRESSION: 1. No acute abnormality seen to explain the patient's symptoms. 2. Trace bilateral pleural fluid, with associated atelectasis. 3. Scattered coronary artery calcifications seen. 4. Diffuse aortic atherosclerosis. 5. Mild diverticulosis along the ascending, descending and sigmoid colon, without evidence of diverticulitis. 6. Trace ascites within the pelvis. Electronically Signed   By: Garald Balding M.D.   On: 07/11/2016 04:00        Scheduled Meds: . atorvastatin  10 mg Oral q1800  . calcium-vitamin D  1 tablet Oral BID WC  . chlorhexidine  15 mL Mouth/Throat Daily  . clopidogrel  75 mg Oral Q breakfast  . folic acid  1 mg Oral Daily  . heparin  5,000 Units Subcutaneous Q8H  . levETIRAcetam  1,500 mg Oral BID  . levofloxacin (LEVAQUIN) IV  750 mg Intravenous Q48H  . methylPREDNISolone (SOLU-MEDROL) injection  40 mg Intravenous Q12H  . multivitamin with minerals  1 tablet Oral Daily  . pantoprazole  40 mg Oral Daily  . tiotropium  18 mcg Inhalation Daily   Continuous Infusions: . 0.9 % NaCl with KCl 20 mEq / L 75 mL/hr at 07/11/16 0035     LOS: 4 days       [facility], MD [facility] Pager [phone]  If 7PM-7AM, please contact night-coverage [email or web address] Password [facility] 07/11/2016, 12:58 PM

## 2016-07-12 DIAGNOSIS — R7881 Bacteremia: Secondary | ICD-10-CM

## 2016-07-12 LAB — BASIC METABOLIC PANEL
Anion gap: 8 (ref 5–15)
BUN: 28 mg/dL — AB (ref 6–20)
CO2: 21 mmol/L — AB (ref 22–32)
CREATININE: 1.29 mg/dL — AB (ref 0.61–1.24)
Calcium: 8.5 mg/dL — ABNORMAL LOW (ref 8.9–10.3)
Chloride: 108 mmol/L (ref 101–111)
GFR calc non Af Amer: 56 mL/min — ABNORMAL LOW (ref >60)
GLUCOSE: 93 mg/dL (ref 65–99)
Potassium: 3.4 mmol/L — ABNORMAL LOW (ref 3.5–5.1)
Sodium: 137 mmol/L (ref 135–145)

## 2016-07-12 MED ORDER — POTASSIUM CHLORIDE CRYS ER 20 MEQ PO TBCR
40.0000 meq | EXTENDED_RELEASE_TABLET | Freq: Once | ORAL | Status: AC
  Administered 2016-07-12: 40 meq via ORAL
  Filled 2016-07-12: qty 2

## 2016-07-12 MED ORDER — LEVOFLOXACIN 750 MG PO TABS
750.0000 mg | ORAL_TABLET | Freq: Every day | ORAL | Status: DC
  Administered 2016-07-13: 750 mg via ORAL
  Filled 2016-07-12: qty 1

## 2016-07-12 MED ORDER — AMLODIPINE BESYLATE 10 MG PO TABS
10.0000 mg | ORAL_TABLET | Freq: Every day | ORAL | Status: DC
  Administered 2016-07-12 – 2016-07-13 (×2): 10 mg via ORAL
  Filled 2016-07-12 (×2): qty 1

## 2016-07-12 NOTE — Progress Notes (Signed)
PROGRESS NOTE  [name]  XBM:[id number] DOB: 03-06-1951  DOA: 07/07/2016 PCP: Wenda Low, MD   Brief Narrative:  65 year old male with PMH of CVA, stage II chronic kidney disease, COPD, HTN, prostate cancer with? Bone metastases, MGUS, chronic anemia, SNF resident, presented to ED on 07/08/16 with complaints of decreased activity and less communicative, noted initially to be febrile and hypotensive with SBP of 70. He had productive cough and diarrhea on admission. Met sepsis criteria on admission and treated per sepsis protocol for initial presumed bibasilar pneumonia versus infectious diarrhea versus UTI.   Assessment & Plan:   Principal Problem:   Sepsis associated hypotension (HCC) Active Problems:   Hypertension   Acute on chronic renal failure (HCC)   Iron deficiency anemia due to chronic blood loss   Diarrhea of presumed infectious origin   CAP (community acquired pneumonia)   AKI (acute kidney injury) ([place])   HCAP (healthcare-associated pneumonia)   1. Sepsis secondary to Salmonella bacteremia: Initially felt to have sepsis due to possible pneumonia versus infectious diarrhea versus UTI. Met sepsis criteria on admission. Treated per sepsis protocol with IV fluids, empiric IV vancomycin and Zosyn. Blood cultures 211/28/17 and then confirmed salmonella. Urine culture negative. RSV panel negative. C. difficile testing negative. Flu panel PCR negative. ID was then consulted on 07/10/16. Recommended continuing levofloxacin and aim for 2 weeks of therapy, repeat/surveillance blood culture and obtain CT abdomen and pelvis >no obvious source for infection. Transitioned to levofloxacin. Sepsis physiology resolved. Surveillance blood cultures 07/10/16: Negative to date. Discussed with Dr. Johnnye Sima 12/3 and okay to switch levofloxacin to PO and likely source is from diarrhea/GI. 2. 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 Presented with creatinine of 7. Acute  kidney injury secondary to dehydration and sepsis. Hydrated and clinically improved. Creatinine 1.29 on 12/3. Continue monitoring BMP. ACEI/HCTZ held. 3. Essential hypertension: uncontrolled. Antihypertensives were initially held due to sepsis and hypotension. Continue when necessary IV hydralazine. Start amlodipine 12/3. 4. Anemia: Stable. 5. COPD exacerbation: Started on IV steroids on 11/29. Exacerbation has resolved. Transitioned to oral prednisone with rapid taper. 6. Dyslipidemia: Continue statins. 7. Self-limiting diarrhea: Resolved. C. difficile testing negative.? Cause of Salmonella bacteremia. Requested RN on 12/2 to send for stool for GI pathogen panel PCR. No sample has been sent yet. 8. History of CVA: Continue Plavix. 9. Prostate cancer with? Bone metastases/MGUS: Outpatient follow-up. 10. Seizure disorder: Continue Keppra at current dose since renal functions are improving. 11. Hypokalemia: Replace and follow.   DVT prophylaxis: Subcutaneous heparin Code Status: DO NOT RESUSCITATE Family Communication: None at bedside Disposition Plan: DC to SNF possibly 12/4.   Consultants:   Infectious disease  Procedures:   None  Antimicrobials:   IV cefepime 11/28 > 12/1  IV vancomycin 1 dose on 11/28  IV levofloxacin 12/2 >    Subjective: Denies complaints. No diarrhea or pain reported. As per RN, no acute issues.  Objective:  Vitals:   07/12/16 0352 07/12/16 0400 07/12/16 0808 07/12/16 0925  BP: (!) 169/82 (!) 160/83 (!) 174/80   Pulse: 70  71   Resp: (!) 22  (!) 27   Temp: 98.3 F (36.8 C)  98.8 F (37.1 C)   TempSrc: Oral  Oral   SpO2: 97%  96% 97%  Weight:      Height:        Intake/Output Summary (Last 24 hours) at 07/12/16 1142 Last data filed at 07/12/16 0900  Gross per 24 hour  Intake  860 ml  Output             1675 ml  Net             -815 ml   Filed Weights   07/08/16 1615 07/09/16 0339 07/10/16 0351  Weight: 82.8 kg (182 lb  8.7 oz) 83.8 kg (184 lb 11.9 oz) 83.4 kg (183 lb 13.8 oz)    Examination:  General exam: Pleasant middle-aged male, chronically ill-looking, lying comfortably propped up in bed. Respiratory system: Clear to auscultation. Respiratory effort normal. Cardiovascular system: S1 & S2 heard, RRR. No JVD, murmurs, rubs, gallops or clicks. No pedal edema. Telemetry: Sinus rhythm. Gastrointestinal system: Abdomen is nondistended, soft and nontender. No organomegaly or masses felt. Normal bowel sounds heard. Central nervous system: Alert and oriented to self and partly to place. No focal neurological deficits. Extremities: Symmetric 5 x 5 power. Skin: No rashes, lesions or ulcers Psychiatry: Judgement and insight appear impaired. Mood & affect appropriate.     Data Reviewed: I have personally reviewed following labs and imaging studies  CBC:  Recent Labs Lab 07/07/16 2015 07/08/16 0250 07/09/16 0300 07/10/16 0228 07/11/16 0832  WBC 11.0* 11.9* 6.6 4.6 7.9  NEUTROABS 9.0*  --   --   --   --   HGB 8.5* 8.5* 7.1* 8.3* 8.4*  HCT 27.9* 28.1* 23.7* 26.7* 26.0*  MCV 77.5* 78.1 78.2 77.4* 75.6*  PLT 353 311 294 261 974   Basic Metabolic Panel:  Recent Labs Lab 07/08/16 0250 07/09/16 0300 07/10/16 0228 07/11/16 0832 07/12/16 0307  NA 138 140 141 139 137  K 3.4* 3.6 3.4* 4.0 3.4*  CL 108 113* 115* 112* 108  CO2 17* 19* 18* 22 21*  GLUCOSE 114* 114* 140* 88 93  BUN 57* 61* 58* 38* 28*  CREATININE 5.60* 3.37* 2.24* 1.46* 1.29*  CALCIUM 8.7* 7.8* 8.0* 8.4* 8.5*   GFR: Estimated Creatinine Clearance: 58.5 mL/min (by C-G formula based on SCr of 1.29 mg/dL (H)). Liver Function Tests:  Recent Labs Lab 07/07/16 2015 07/09/16 0300  AST 52* 75*  ALT 31 31  ALKPHOS 41 33*  BILITOT 0.2* 0.5  PROT 7.9 6.7  ALBUMIN 3.3* 2.5*   No results for input(s): LIPASE, AMYLASE in the last 168 hours. No results for input(s): AMMONIA in the last 168 hours. Coagulation Profile: No results for  input(s): INR, PROTIME in the last 168 hours. Cardiac Enzymes: No results for input(s): CKTOTAL, CKMB, CKMBINDEX, TROPONINI in the last 168 hours. BNP (last 3 results) No results for input(s): PROBNP in the last 8760 hours. HbA1C: No results for input(s): HGBA1C in the last 72 hours. CBG:  Recent Labs Lab 07/10/16 0747  GLUCAP 111*   Lipid Profile: No results for input(s): CHOL, HDL, LDLCALC, TRIG, CHOLHDL, LDLDIRECT in the last 72 hours. Thyroid Function Tests: No results for input(s): TSH, T4TOTAL, FREET4, T3FREE, THYROIDAB in the last 72 hours. Anemia Panel: No results for input(s): VITAMINB12, FOLATE, FERRITIN, TIBC, IRON, RETICCTPCT in the last 72 hours.  Sepsis Labs:  Recent Labs Lab 07/07/16 2016 07/07/16 2341 07/07/16 2358 07/08/16 0250  LATICACIDVEN 1.63 1.5 1.63 1.1    Recent Results (from the past 240 hour(s))  Blood Culture (routine x 2)     Status: Abnormal   Collection Time: 07/07/16  7:58 PM  Result Value Ref Range Status   Specimen Description BLOOD RIGHT ARM  Final   Special Requests BOTTLES DRAWN AEROBIC AND ANAEROBIC 5 CC  Final   Culture  Setup Time   Final    GRAM NEGATIVE RODS IN BOTH AEROBIC AND ANAEROBIC BOTTLES CRITICAL RESULT CALLED TO, READ BACK BY AND VERIFIED WITH: C. Welles Pharm.D. 12:15 07/08/16  ([email or web address])    Culture (A)  Final    SALMONELLA SPECIES Results Called to: A. AGUSTIN RN, AT [phone] 07/10/16 BY D. VANHOOK [facility]FIRM[facility] HEALTH DEPARTMENT NOTIFIED    Report Status 07/11/2016 FINAL  Final   Organism ID, Bacteria SALMONELLA SPECIES  Final      Susceptibility   Salmonella species - MIC*    AMPICILLIN <=2 SENSITIVE Sensitive     LEVOFLOXACIN <=0.12 SENSITIVE Sensitive     TRIMETH/SULFA <=20 SENSITIVE Sensitive     * SALMONELLA SPECIES  Blood Culture ID Panel (Reflexed)     Status: Abnormal   Collection Time: 07/07/16  7:58 PM  Result Value Ref Range Status   Enterococcus species NOT DETECTED NOT DETECTED  Final   Listeria monocytogenes NOT DETECTED NOT DETECTED Final   Staphylococcus species NOT DETECTED NOT DETECTED Final   Staphylococcus aureus NOT DETECTED NOT DETECTED Final   Streptococcus species NOT DETECTED NOT DETECTED Final   Streptococcus agalactiae NOT DETECTED NOT DETECTED Final   Streptococcus pneumoniae NOT DETECTED NOT DETECTED Final   Streptococcus pyogenes NOT DETECTED NOT DETECTED Final   Acinetobacter baumannii NOT DETECTED NOT DETECTED Final   Enterobacteriaceae species DETECTED (A) NOT DETECTED Final    Comment: CRITICAL RESULT CALLED TO, READ BACK BY AND VERIFIED WITH: C. Welles Pharm.D. 12:15 07/08/16 ([email or web address])    Enterobacter cloacae complex NOT DETECTED NOT DETECTED Final   Escherichia coli NOT DETECTED NOT DETECTED Final   Klebsiella oxytoca NOT DETECTED NOT DETECTED Final   Klebsiella pneumoniae NOT DETECTED NOT DETECTED Final   Proteus species NOT DETECTED NOT DETECTED Final   Serratia marcescens NOT DETECTED NOT DETECTED Final   Carbapenem resistance NOT DETECTED NOT DETECTED Final   Haemophilus influenzae NOT DETECTED NOT DETECTED Final   Neisseria meningitidis NOT DETECTED NOT DETECTED Final   Pseudomonas aeruginosa NOT DETECTED NOT DETECTED Final   Candida albicans NOT DETECTED NOT DETECTED Final   Candida glabrata NOT DETECTED NOT DETECTED Final   Candida krusei NOT DETECTED NOT DETECTED Final   Candida parapsilosis NOT DETECTED NOT DETECTED Final   Candida tropicalis NOT DETECTED NOT DETECTED Final  Blood Culture (routine x 2)     Status: Abnormal   Collection Time: 07/07/16  8:08 PM  Result Value Ref Range Status   Specimen Description BLOOD RIGHT HAND  Final   Special Requests BOTTLES DRAWN AEROBIC AND ANAEROBIC 5 CC  Final   Culture  Setup Time   Final    GRAM NEGATIVE RODS IN BOTH AEROBIC AND ANAEROBIC BOTTLES CRITICAL VALUE NOTED.  VALUE IS CONSISTENT WITH PREVIOUSLY REPORTED AND CALLED VALUE.    Culture (A)  Final    SALMONELLA  SPECIES Results Called to: A. AGUSTIN RN, AT [phone] 07/10/16 BY D. VANHOOK SUSCEPTIBILITIES PERFORMED ON PREVIOUS CULTURE WITHIN THE LAST 5 DAYS. HEALTH DEPARTMENT NOTIFIED    Report Status 07/11/2016 FINAL  Final  Urine culture     Status: None   Collection Time: 07/07/16  8:59 PM  Result Value Ref Range Status   Specimen Description URINE, RANDOM  Final   Special Requests NONE  Final   Culture NO GROWTH  Final   Report Status 07/09/2016 FINAL  Final  Respiratory Panel by PCR     Status: None   Collection Time: 07/08/16 12:14  AM  Result Value Ref Range Status   Adenovirus NOT DETECTED NOT DETECTED Final   Coronavirus 229E NOT DETECTED NOT DETECTED Final   Coronavirus HKU1 NOT DETECTED NOT DETECTED Final   Coronavirus NL63 NOT DETECTED NOT DETECTED Final   Coronavirus OC43 NOT DETECTED NOT DETECTED Final   Metapneumovirus NOT DETECTED NOT DETECTED Final   Rhinovirus / Enterovirus NOT DETECTED NOT DETECTED Final   Influenza A NOT DETECTED NOT DETECTED Final   Influenza B NOT DETECTED NOT DETECTED Final   Parainfluenza Virus 1 NOT DETECTED NOT DETECTED Final   Parainfluenza Virus 2 NOT DETECTED NOT DETECTED Final   Parainfluenza Virus 3 NOT DETECTED NOT DETECTED Final   Parainfluenza Virus 4 NOT DETECTED NOT DETECTED Final   Respiratory Syncytial Virus NOT DETECTED NOT DETECTED Final   Bordetella pertussis NOT DETECTED NOT DETECTED Final   Chlamydophila pneumoniae NOT DETECTED NOT DETECTED Final   Mycoplasma pneumoniae NOT DETECTED NOT DETECTED Final  C difficile quick scan w PCR reflex     Status: None   Collection Time: 07/08/16  1:28 AM  Result Value Ref Range Status   C Diff antigen NEGATIVE NEGATIVE Final   C Diff toxin NEGATIVE NEGATIVE Final   C Diff interpretation No C. difficile detected.  Final  Culture, expectorated sputum-assessment     Status: None   Collection Time: 07/08/16  1:28 AM  Result Value Ref Range Status   Specimen Description EXPECTORATED SPUTUM  Final    Special Requests NONE  Final   Sputum evaluation   Final    MICROSCOPIC FINDINGS SUGGEST THAT THIS SPECIMEN IS NOT REPRESENTATIVE OF LOWER RESPIRATORY SECRETIONS. PLEASE RECOLLECT. Gram Stain Report Called to,Read Back By and Verified With: RN A.MCKEOWN [id number] MLM    Report Status 07/08/2016 FINAL  Final  MRSA PCR Screening     Status: None   Collection Time: 07/08/16  4:13 PM  Result Value Ref Range Status   MRSA by PCR NEGATIVE NEGATIVE Final    Comment:        The GeneXpert MRSA Assay (FDA approved for NASAL specimens only), is one component of a comprehensive MRSA colonization surveillance program. It is not intended to diagnose MRSA infection nor to guide or monitor treatment for MRSA infections.   Culture, blood (Routine X 2) w Reflex to ID Panel     Status: None (Preliminary result)   Collection Time: 07/10/16  3:42 PM  Result Value Ref Range Status   Specimen Description BLOOD RIGHT HAND  Final   Special Requests BOTTLES DRAWN AEROBIC ONLY 5CC  Final   Culture NO GROWTH < 24 HOURS  Final   Report Status PENDING  Incomplete  Culture, blood (routine x 2)     Status: None (Preliminary result)   Collection Time: 07/10/16  3:47 PM  Result Value Ref Range Status   Specimen Description BLOOD RIGHT ARM  Final   Special Requests BOTTLES DRAWN AEROBIC ONLY 5CC  Final   Culture NO GROWTH < 24 HOURS  Final   Report Status PENDING  Incomplete         Radiology Studies: Ct Abdomen Pelvis Wo Contrast  Result Date: 07/11/2016 CLINICAL DATA:  Follow-up sepsis.  Initial encounter. EXAM: CT ABDOMEN AND PELVIS WITHOUT CONTRAST TECHNIQUE: Multidetector CT imaging of the abdomen and pelvis was performed following the standard protocol without IV contrast. COMPARISON:  Renal ultrasound performed 07/16/2014 FINDINGS: Lower chest: Trace bilateral pleural fluid is noted, with associated atelectasis. Scattered coronary artery calcifications are seen.  Hepatobiliary: The liver is  unremarkable in appearance. The gallbladder is not definitely characterized. The common bile duct remains normal in caliber. Pancreas: The pancreas is within normal limits. Spleen: The spleen is unremarkable in appearance. Adrenals/Urinary Tract: The adrenal glands are unremarkable in appearance. The kidneys are within normal limits. There is no evidence of hydronephrosis. No renal or ureteral stones are identified. No perinephric stranding is seen. Stomach/Bowel: The stomach is unremarkable in appearance. The small bowel is within normal limits. The appendix is normal in caliber, without evidence of appendicitis. Mild diverticulosis is noted along the ascending, descending and sigmoid colon, without evidence of diverticulitis. Vascular/Lymphatic: Diffuse calcification is seen along the abdominal aorta and its branches. The abdominal aorta is otherwise grossly unremarkable. The inferior vena cava is grossly unremarkable. No retroperitoneal lymphadenopathy is seen. No pelvic sidewall lymphadenopathy is identified. Reproductive: The bladder is mildly distended and grossly unremarkable. The prostate remains normal in size. Other: Trace free fluid is noted within the pelvis. Musculoskeletal: No acute osseous abnormalities are identified. A right femoral pin is partially imaged. The visualized musculature is unremarkable in appearance. IMPRESSION: 1. No acute abnormality seen to explain the patient's symptoms. 2. Trace bilateral pleural fluid, with associated atelectasis. 3. Scattered coronary artery calcifications seen. 4. Diffuse aortic atherosclerosis. 5. Mild diverticulosis along the ascending, descending and sigmoid colon, without evidence of diverticulitis. 6. Trace ascites within the pelvis. Electronically Signed   By: Garald Balding M.D.   On: 07/11/2016 04:00        Scheduled Meds: . amLODipine  10 mg Oral Daily  . atorvastatin  10 mg Oral q1800  . calcium-vitamin D  1 tablet Oral BID WC  .  chlorhexidine  15 mL Mouth/Throat Daily  . clopidogrel  75 mg Oral Q breakfast  . folic acid  1 mg Oral Daily  . heparin  5,000 Units Subcutaneous Q8H  . levETIRAcetam  1,500 mg Oral BID  . levofloxacin (LEVAQUIN) IV  750 mg Intravenous Q24H  . multivitamin with minerals  1 tablet Oral Daily  . pantoprazole  40 mg Oral Daily  . predniSONE  40 mg Oral Q breakfast  . tiotropium  18 mcg Inhalation Daily   Continuous Infusions:    LOS: 5 days       Spencer Cardinal, MD [facility] Pager [phone]  If 7PM-7AM, please contact night-coverage [email or web address] Password [email or web address] 07/12/2016, 11:42 AM

## 2016-07-13 LAB — BASIC METABOLIC PANEL
Anion gap: 5 (ref 5–15)
BUN: 23 mg/dL — AB (ref 6–20)
CHLORIDE: 106 mmol/L (ref 101–111)
CO2: 23 mmol/L (ref 22–32)
CREATININE: 1.18 mg/dL (ref 0.61–1.24)
Calcium: 8.5 mg/dL — ABNORMAL LOW (ref 8.9–10.3)
GFR calc Af Amer: >60 mL/min (ref >60)
GFR calc non Af Amer: >60 mL/min (ref >60)
Glucose, Bld: 91 mg/dL (ref 65–99)
Potassium: 4.3 mmol/L (ref 3.5–5.1)
SODIUM: 134 mmol/L — AB (ref 135–145)

## 2016-07-13 LAB — MAGNESIUM: Magnesium: 1.4 mg/dL — ABNORMAL LOW (ref 1.7–2.4)

## 2016-07-13 MED ORDER — AMLODIPINE BESYLATE 10 MG PO TABS
10.0000 mg | ORAL_TABLET | Freq: Every day | ORAL | Status: DC
Start: 2016-07-14 — End: 2023-02-24

## 2016-07-13 MED ORDER — PREDNISONE 10 MG PO TABS: ORAL_TABLET | ORAL | Status: DC

## 2016-07-13 MED ORDER — MAGNESIUM SULFATE 4 GM/100ML IV SOLN
4.0000 g | Freq: Once | INTRAVENOUS | Status: AC
  Administered 2016-07-13: 4 g via INTRAVENOUS
  Filled 2016-07-13 (×2): qty 100

## 2016-07-13 MED ORDER — LEVOFLOXACIN 750 MG PO TABS: 750.0000 mg | ORAL_TABLET | Freq: Every day | ORAL | Status: AC

## 2016-07-13 NOTE — Progress Notes (Signed)
Report called to [facility]. 

## 2016-07-13 NOTE — Progress Notes (Signed)
Patient will discharge to [facility] Anticipated discharge date: 12/4 Family notified: pt to notify Transportation by PTAR- called at 12:40pm  [place] signing off.  Jorge Ny, LCSW Clinical Social Worker [phone]

## 2016-07-13 NOTE — Discharge Instructions (Signed)
Bacteremia-Salmonella Introduction Bacteremia is the presence of bacteria in the blood. A small amount of bacteria may not cause any symptoms. Sometimes, the bacteria spread and cause infection in other parts of the body, such as the heart, joints, bones, or brain. Having a great amount of bacteria can cause a serious, sometimes life-threatening infection called sepsis. What are the causes? This condition is caused by bacteria that get into the blood. Bacteria can enter the blood:  During a dental or medical procedure.  After you brush your teeth so hard that the gums bleed.  Through a scrape or cut on your skin. More severe types of bacteremia can be caused by:  A bacterial infection, such as pneumonia, that spreads to the blood.  Using a dirty needle. What increases the risk? This condition is more likely to develop in:  Children and elderly adults.  People who have a long-lasting (chronic) disease or medical condition.  People who have an artificial joint or heart valve.  People who have heart valve disease.  People who have a tube, such as a catheter or IV tube, that has been inserted for a medical treatment.  People who have a weak body defense system (immune system).  People who use IV drugs. What are the signs or symptoms? Usually, this condition does not cause symptoms when it is mild. When it is more serious, it may cause:  Fever.  Chills.  Racing heart.  Shortness of breath.  Dizziness.  Weakness.  Confusion.  Nausea or vomiting.  Diarrhea. Bacteremia that has spread to other parts of the body may cause symptoms in those areas. How is this diagnosed? This condition may be diagnosed with a physical exam and tests, such as:  A complete blood count (CBC). This test looks for signs of infection.  Blood cultures. These look for bacteria in your blood.  Tests of any IV tubes. These look for a source of infection.  Urine tests.  Imaging tests, such  as an X-ray, CT scan, MRI, or heart ultrasound. How is this treated? If the condition is mild, treatment is usually not needed. Usually, the bodys immune system will remove the bacteria. If the condition is more serious, it may be treated with:  Antibiotic medicines through an IV tube. These may be given for about 2 weeks. At first, the antibiotic that is given may kill most types of blood bacteria. If your test results show that a certain kind of bacteria is causing problems, the antibiotic may be changed to kill only the bacteria that are causing problems.  Antibiotics taken by mouth.  Removing any catheter or IV tube that is a source of infection.  Blood pressure and breathing support, if needed.  Surgery to control the source or spread of infection, if needed. Follow these instructions at home:  Take over-the-counter and prescription medicines only as told by your health care provider.  If you were prescribed an antibiotic, take it as told by your health care provider. Do not stop taking the antibiotic even if you start to feel better.  Rest at home until your condition is under control.  Drink enough fluid to keep your urine clear or pale yellow.  Keep all follow-up visits as told by your health care provider. This is important. How is this prevented? Take these actions to help prevent future episodes of bacteremia:  Get all vaccinations as recommended by your health care provider.  Clean and cover scrapes or cuts.  Bathe regularly.  Wash your  hands often.  Before any dental or surgical procedure, ask your health care provider if you should take an antibiotic. Contact a health care provider if:  Your symptoms get worse.  You continue to have symptoms after treatment.  You develop new symptoms after treatment. Get help right away if:  You have chest pain or trouble breathing.  You develop confusion, dizziness, or weakness.  You develop pale skin. This  information is not intended to replace advice given to you by your health care provider. Make sure you discuss any questions you have with your health care provider. Document Released: 05/10/2006 Document Revised: 02/14/2016 Document Reviewed: 09/29/2014  2017 [employer]    Acute Kidney Injury, Adult Acute kidney injury (AKI) occurs when there is sudden (acute) damage to the kidneys.A small amount of kidney damage may not cause problems, but a large amount of damage may make it difficult or impossible for the kidneys to work the way they should. AKI may develop into long-lasting (chronic) kidney disease. Early detection and treatment of AKI may prevent kidney damage from becoming permanent or getting worse. What are the causes? Common causes of this condition include:  A problem with blood flow to the kidneys. This may be caused by:  Blood loss.  Heart and blood vessel (cardiovascular) disease.  Severe burns.  Liver disease.  Direct damage to the kidneys. This may be caused by:  Some medicines.  A kidney infection.  Poisoning.  Being around or in contact with poisonous (toxic) substances.  A surgical wound.  A hard, direct force to the kidney area.  A sudden blockage of urine flow. This may be caused by:  Cancer.  Kidney stones.  Enlarged prostate in males. What are the signs or symptoms? Symptoms develop slowly and may not be obvious until the kidney damage becomes severe. It is possible to have AKI for years without showing any symptoms. Symptoms of this condition can include:  Swelling (edema) of the face, legs, ankles, or feet.  Numbness, tingling, or loss of feeling (sensation) in the hands or feet.  Tiredness (lethargy).  Nausea or vomiting.  Confusion or trouble concentrating.  Problems with urination, such as:  Painful or burning feeling during urination.  Decreased urine production.  Frequent urination, especially at night.  Bloody  urine.  Muscle twitches and cramps, especially in the legs.  Shortness of breath.  Weakness.  Constant itchiness.  Loss of appetite.  Metallic taste in the mouth.  Trouble sleeping.  Pale lining of the eyelids and surface of the eye (conjunctiva). How is this diagnosed? This condition may be diagnosed with various tests. Tests may include:  Blood tests.  Urine tests.  Imaging tests.  A test in which a sample of tissue is removed from the kidneys to be looked at under a microscope (kidney biopsy). How is this treated? Treatment of AKIvaries depending on the cause and severity of the kidney damage. In mild cases, treatment may not be needed. The kidneys may heal on their own. If AKI is more severe, your health care provider will treat the cause of the kidney damage, help the kidneys heal, and prevent problems from occurring. Severe cases may require a procedure to remove toxic wastes from the body (dialysis) or surgery to repair kidney damage. Surgery may involve:  Repair of a torn kidney.  Removal of a urine flow obstruction. Follow these instructions at home:  Follow your prescribed diet.  Take over-the-counter and prescription medicines only as told by your health care  provider.  Do not take any new medicines unless approved by your health care provider. Many medicines can worsen your kidney damage.  Do not take any vitamin and mineral supplements unless approved by your health care provider. Many nutritional supplements can worsen your kidney damage.  The dose of some medicines that you take may need to be adjusted.  Do not use any tobacco products, such as cigarettes, chewing tobacco, and e-cigarettes. If you need help quitting, ask your health care provider.  Keep all follow-up visits as told by your health care provider. This is important.  Keep track of your blood pressure. Report changes in your blood pressure as told by your health care provider.  Achieve  and maintain a healthy weight. If you need help with this, ask your health care provider.  Start or continue an exercise plan. Try to exercise at least 30 minutes a day, 5 days a week.  Stay current with immunizations as told by your health care provider. Where to find more information:  [employer] of Kidney Patients: BombTimer.gl  National Kid[employer] Foundation: www.kidney.[place]: https://mathis.com/  [facility]: www.lifeoptions.org and www.kidneyschool.org Contact a health care provider if:  Your symptoms get worse.  You develop new symptoms. Get help right away if:  You develop symptoms of end-stage kidney disease, which include:  Headaches.  Abnormally dark or light skin.  Numbness in the hands or feet.  Easy bruising.  Frequent hiccups.  Chest pain.  Shortness of breath.  End of menstruation in women.  You have a fever.  You have decreased urine production.  You have pain or bleeding when you urinate. This information is not intended to replace advice given to you by your health care provider. Make sure you discuss any questions you have with your health care provider. Document Released: 02/09/2011 Document Revised: 03/05/2016 Document Reviewed: 03/25/2012 [employer] Interactive Patient Education  2017 [employer].

## 2016-07-13 NOTE — Discharge Summary (Signed)
Physician Discharge Summary  [name] ZDG:[id number] DOB: [birth year]  PCP: Wenda Low, MD  Admit date: 07/07/2016 Discharge date: 07/13/2016  Recommendations for Outpatient Follow-up:  1. M.D. at SNF in 3 days with repeat labs (CBC, BMP & magnesium). Please follow final surveillance blood culture results that were sent from the hospital on 07/10/16. 2. Recommend repeating chest x-ray in 3-4 weeks to ensure resolution of findings that were seen on chest x-ray 07/07/16. 3. Dr. Wenda Low, PCP upon discharge from SNF.  Home Health: None Equipment/Devices: None    Discharge Condition: Improved and stable  CODE STATUS: DO NOT RESUSCITATE  Diet recommendation: As per speech therapy recommendations as follows,  Diet recommendations: Dysphagia 2 (fine chop);Thin liquid Liquids provided via: Cup;Straw Medication Administration: Whole meds with puree Supervision: Patient able to self feed  Discharge Diagnoses:  Principal Problem:   Sepsis associated hypotension ([place]) Active Problems:   Hypertension   Acute on chronic renal failure (HCC)   Iron deficiency anemia due to chronic blood loss   Diarrhea of presumed infectious origin   CAP (community acquired pneumonia)   AKI (acute kidney injury) ([place])   HCAP (healthcare-associated pneumonia)   Brief/Interim Summary:  1. Sepsis secondary to Salmonella bacteremia: Initially felt to have sepsis due to possible pneumonia versus infectious diarrhea versus UTI. Met sepsis criteria on admission. Treated per sepsis protocol with IV fluids, empiric IV vancomycin and Zosyn. Blood cultures 2 on 07/07/16 confirmed Salmonella. Urine culture negative. RSV panel negative. C. difficile testing negative. Flu panel PCR negative. ID was then consulted on 07/10/16 & recommended continuing levofloxacin and aim for 2 weeks of therapy, repeat/surveillance blood culture and obtain CT abdomen and pelvis >no obvious source for infection. Transitioned to  levofloxacin. Sepsis physiology resolved. Surveillance blood cultures 07/10/16: Negative to date. Likely source is from diarrhea/GI. Complete 2 weeks of levofloxacin from negative surveillance blood cultures 07/10/16 with end date on 07/23/16. 2. 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 Presented with creatinine of 7. Acute kidney injury secondary to dehydration and sepsis. Hydrated and acute kidney injury has resolved. Creatinine normalized. ACEI/HCTZ held due to AKI and will continue to hold until outpatient follow-up at SNF. 3. Essential hypertension: Mildly uncontrolled. Antihypertensives were initially held due to sepsis and hypotension. Started amlodipine 12/3. Follow-up at SNF and adjust medications as needed. 4. Anemia: Stable. Outpatient evaluation and follow-up as deemed necessary. 5. COPD exacerbation: Started on IV steroids on 11/29. Exacerbation has resolved. Transitioned to oral prednisone with rapid taper. 6. Dyslipidemia: Continue statins. 7. Self-limiting diarrhea: Resolved. C. difficile testing negative.? Cause of Salmonella bacteremia. No GI pathogen panel PCR was sent. FOBT 1: Negative. 8. History of CVA: Continue Plavix. 9. Prostate cancer with? Bone metastases/MGUS: Outpatient follow-up. 10. Seizure disorder: Continue Keppra. 11. Hypokalemia: Replaced.  12. Hypomagnesemia: Magnesium 1.4 today. Replaced IV prior to discharge. Outpatient follow-up.    Consultants:   Infectious disease  Procedures:   None   Discharge Instructions  Discharge Instructions    Call MD for:    Complete by:  As directed    Recurrent diarrhea.   Call MD for:  difficulty breathing, headache or visual disturbances    Complete by:  As directed    Call MD for:  extreme fatigue    Complete by:  As directed    Call MD for:  persistant dizziness or light-headedness    Complete by:  As directed    Call MD for:  persistant nausea and vomiting  Complete by:  As  directed    Call MD for:  temperature >100.4    Complete by:  As directed    Discharge instructions    Complete by:  As directed    DIET: As per speech therapy recommendations as follows,  Diet recommendations: Dysphagia 2 (fine chop);Thin liquid Liquids provided via: Cup;Straw Medication Administration: Whole meds with puree Supervision: Patient able to self feed   Increase activity slowly    Complete by:  As directed        Medication List    STOP taking these medications   lisinopril-hydrochlorothiazide 20-25 MG tablet Commonly known as:  PRINZIDE,ZESTORETIC     TAKE these medications   albuterol 108 (90 Base) MCG/ACT inhaler Commonly known as:  PROVENTIL HFA;VENTOLIN HFA Inhale 2 puffs into the lungs every 6 (six) hours as needed for wheezing.   amLODipine 10 MG tablet Commonly known as:  NORVASC Take 1 tablet (10 mg total) by mouth daily. Start taking on:  07/14/2016   atorvastatin 10 MG tablet Commonly known as:  LIPITOR Take 10 mg by mouth daily at 6 PM.   CALTRATE 600+D 600-400 MG-UNIT tablet Generic drug:  Calcium Carbonate-Vitamin D Take 1 tablet by mouth 2 (two) times daily.   chlorhexidine 0.12 % solution Commonly known as:  PERIDEX Use as directed 15 mLs in the mouth or throat daily.   cloNIDine 0.1 MG tablet Commonly known as:  CATAPRES Take 0.1 mg by mouth 2 (two) times daily as needed (for SBP >160 or DBP >95).   clopidogrel 75 MG tablet Commonly known as:  PLAVIX Take 1 tablet (75 mg total) by mouth daily with breakfast.   folic acid 1 MG tablet Commonly known as:  FOLVITE Take 1 tablet (1 mg total) by mouth daily.   levETIRAcetam 500 MG tablet Commonly known as:  KEPPRA Take 1,500 mg by mouth 2 (two) times daily.   levofloxacin 750 MG tablet Commonly known as:  LEVAQUIN Take 1 tablet (750 mg total) by mouth daily before breakfast. Discontinue after 07/23/16 dose. Start taking on:  07/14/2016   multivitamin with minerals Tabs  tablet Take 1 tablet by mouth daily.   OVER THE COUNTER MEDICATION Take 1 Container by mouth 3 (three) times daily. Magic cup   pantoprazole 40 MG tablet Commonly known as:  PROTONIX Take 1 tablet (40 mg total) by mouth daily.   predniSONE 10 MG tablet Commonly known as:  DELTASONE Take 3 tabs daily for 2 days, then 2 tabs daily for 2 days, then 1 tab daily for 2 days, then stop. Start taking on:  07/14/2016   tiotropium 18 MCG inhalation capsule Commonly known as:  SPIRIVA HANDIHALER Place 1 capsule (18 mcg total) into inhaler and inhale daily.      Follow-up Information    M.D. at SNF. Schedule an appointment as soon as possible for a visit in 3 day(s).   Why:  To be seen with repeat labs (CBC, BMP & Mg). Please follow final surveillance blood cultures that were sent from hospital on 07/10/16.       Wenda Low, MD. Schedule an appointment as soon as possible for a visit.   Specialty:  Internal Medicine Why:  Upon discharge from SNF. Contact information: [address] [place] [address] [phone]          Allergies  Allergen Reactions  . Other Other (See Comments)    Epidural caused seizure  . Aspirin Hives  . Penicillins Hives  Procedures/Studies: Ct Abdomen Pelvis Wo Contrast  Result Date: 07/11/2016 CLINICAL DATA:  Follow-up sepsis.  Initial encounter. EXAM: CT ABDOMEN AND PELVIS WITHOUT CONTRAST TECHNIQUE: Multidetector CT imaging of the abdomen and pelvis was performed following the standard protocol without IV contrast. COMPARISON:  Renal ultrasound performed 07/16/2014 FINDINGS: Lower chest: Trace bilateral pleural fluid is noted, with associated atelectasis. Scattered coronary artery calcifications are seen. Hepatobiliary: The liver is unremarkable in appearance. The gallbladder is not definitely characterized. The common bile duct remains normal in caliber. Pancreas: The pancreas is within normal limits. Spleen: The spleen is  unremarkable in appearance. Adrenals/Urinary Tract: The adrenal glands are unremarkable in appearance. The kidneys are within normal limits. There is no evidence of hydronephrosis. No renal or ureteral stones are identified. No perinephric stranding is seen. Stomach/Bowel: The stomach is unremarkable in appearance. The small bowel is within normal limits. The appendix is normal in caliber, without evidence of appendicitis. Mild diverticulosis is noted along the ascending, descending and sigmoid colon, without evidence of diverticulitis. Vascular/Lymphatic: Diffuse calcification is seen along the abdominal aorta and its branches. The abdominal aorta is otherwise grossly unremarkable. The inferior vena cava is grossly unremarkable. No retroperitoneal lymphadenopathy is seen. No pelvic sidewall lymphadenopathy is identified. Reproductive: The bladder is mildly distended and grossly unremarkable. The prostate remains normal in size. Other: Trace free fluid is noted within the pelvis. Musculoskeletal: No acute osseous abnormalities are identified. A right femoral pin is partially imaged. The visualized musculature is unremarkable in appearance. IMPRESSION: 1. No acute abnormality seen to explain the patient's symptoms. 2. Trace bilateral pleural fluid, with associated atelectasis. 3. Scattered coronary artery calcifications seen. 4. Diffuse aortic atherosclerosis. 5. Mild diverticulosis along the ascending, descending and sigmoid colon, without evidence of diverticulitis. 6. Trace ascites within the pelvis. Electronically Signed   By: Garald Balding M.D.   On: 07/11/2016 04:00   Dg Chest Port 1 View  Result Date: 07/07/2016 CLINICAL DATA:  Shortness of breath and sepsis EXAM: PORTABLE CHEST 1 VIEW COMPARISON:  Chest CT 07/16/2014 FINDINGS: Examination is degraded by patient motion. There is atherosclerotic calcification within the aortic arch. Cardiomediastinal contours are otherwise unremarkable. There are  bibasilar opacities, right greater than left. No pneumothorax or sizable pleural effusion. IMPRESSION: Motion degraded examination. Within that limitation, there are bibasilar opacities, right worse than left, which may indicate atelectasis or developing consolidation. Aortic atherosclerosis. Electronically Signed   By: Ulyses Jarred M.D.   On: 07/07/2016 22:02      Subjective: Denies complaints. No dyspnea, chest pain, cough, nausea, vomiting, abdominal pain or diarrhea. As per RN, no acute issues reported.  Discharge Exam:  Vitals:   07/13/16 0000 07/13/16 0400 07/13/16 0843 07/13/16 0938  BP:  (!) 173/79 (!) 155/90   Pulse:  67    Resp:  (!) 24    Temp: 98 F (36.7 C) 97.7 F (36.5 C) 97.5 F (36.4 C)   TempSrc: Oral Oral Oral   SpO2:  97%  98%  Weight:      Height:        General exam: Pleasant middle-aged male, chronically ill-looking, lying comfortably propped up in bed. Respiratory system: Clear to auscultation. Respiratory effort normal. Cardiovascular system: S1 & S2 heard, RRR. No JVD, murmurs, rubs, gallops or clicks. No pedal edema. Telemetry: Sinus rhythm. Gastrointestinal system: Abdomen is nondistended, soft and nontender. No organomegaly or masses felt. Normal bowel sounds heard. Central nervous system: Alert and oriented to self and partly to place. No focal neurological deficits.  Extremities: Symmetric 5 x 5 power. Skin: No rashes, lesions or ulcers Psychiatry: Judgement and insight appear impaired. Mood & affect appropriate.      The results of significant diagnostics from this hospitalization (including imaging, microbiology, ancillary and laboratory) are listed below for reference.     Microbiology: Recent Results (from the past 240 hour(s))  Blood Culture (routine x 2)     Status: Abnormal   Collection Time: 07/07/16  7:58 PM  Result Value Ref Range Status   Specimen Description BLOOD RIGHT ARM  Final   Special Requests BOTTLES DRAWN AEROBIC AND  ANAEROBIC 5 CC  Final   Culture  Setup Time   Final    GRAM NEGATIVE RODS IN BOTH AEROBIC AND ANAEROBIC BOTTLES CRITICAL RESULT CALLED TO, READ BACK BY AND VERIFIED WITH: C. Welles Pharm.D. 12:15 07/08/16  ([email or web address])    Culture (A)  Final    SALMONELLA SPECIES Results Called to: A. AGUSTIN RN, AT [phone] 07/10/16 BY D. VANHOOK [facility]ENT [facility]FIRMATION HEALTH DEPARTMENT NOTIFIED    Report Status 07/11/2016 FINAL  Final   Organism ID, Bacteria SALMONELLA SPECIES  Final      Susceptibility   Salmonella species - MIC*    AMPICILLIN <=2 SENSITIVE Sensitive     LEVOFLOXACIN <=0.12 SENSITIVE Sensitive     TRIMETH/SULFA <=20 SENSITIVE Sensitive     * SALMONELLA SPECIES  Blood Culture ID Panel (Reflexed)     Status: Abnormal   Collection Time: 07/07/16  7:58 PM  Result Value Ref Range Status   Enterococcus species NOT DETECTED NOT DETECTED Final   Listeria monocytogenes NOT DETECTED NOT DETECTED Final   Staphylococcus species NOT DETECTED NOT DETECTED Final   Staphylococcus aureus NOT DETECTED NOT DETECTED Final   Streptococcus species NOT DETECTED NOT DETECTED Final   Streptococcus agalactiae NOT DETECTED NOT DETECTED Final   Streptococcus pneumoniae NOT DETECTED NOT DETECTED Final   Streptococcus pyogenes NOT DETECTED NOT DETECTED Final   Acinetobacter baumannii NOT DETECTED NOT DETECTED Final   Enterobacteriaceae species DETECTED (A) NOT DETECTED Final    Comment: CRITICAL RESULT CALLED TO, READ BACK BY AND VERIFIED WITH: C. Welles Pharm.D. 12:15 07/08/16 ([email or web address])    Enterobacter cloacae complex NOT DETECTED NOT DETECTED Final   Escherichia coli NOT DETECTED NOT DETECTED Final   Klebsiella oxytoca NOT DETECTED NOT DETECTED Final   Klebsiella pneumoniae NOT DETECTED NOT DETECTED Final   Proteus species NOT DETECTED NOT DETECTED Final   Serratia marcescens NOT DETECTED NOT DETECTED Final   Carbapenem resistance NOT DETECTED NOT DETECTED Final   Haemophilus influenzae NOT  DETECTED NOT DETECTED Final   Neisseria meningitidis NOT DETECTED NOT DETECTED Final   Pseudomonas aeruginosa NOT DETECTED NOT DETECTED Final   Candida albicans NOT DETECTED NOT DETECTED Final   Candida glabrata NOT DETECTED NOT DETECTED Final   Candida krusei NOT DETECTED NOT DETECTED Final   Candida parapsilosis NOT DETECTED NOT DETECTED Final   Candida tropicalis NOT DETECTED NOT DETECTED Final  Blood Culture (routine x 2)     Status: Abnormal   Collection Time: 07/07/16  8:08 PM  Result Value Ref Range Status   Specimen Description BLOOD RIGHT HAND  Final   Special Requests BOTTLES DRAWN AEROBIC AND ANAEROBIC 5 CC  Final   Culture  Setup Time   Final    GRAM NEGATIVE RODS IN BOTH AEROBIC AND ANAEROBIC BOTTLES CRITICAL VALUE NOTED.  VALUE IS CONSISTENT WITH PREVIOUSLY REPORTED AND CALLED VALUE.    Culture (A)  Final    SALMONELLA SPECIES Results Called to: A. AGUSTIN RN, AT [id number] 07/10/16 BY D. VANHOOK SUSCEPTIBILITIES PERFORMED ON PREVIOUS CULTURE WITHIN THE LAST 5 DAYS. HEALTH DEPARTMENT NOTIFIED    Report Status 07/11/2016 FINAL  Final  Urine culture     Status: None   Collection Time: 07/07/16  8:59 PM  Result Value Ref Range Status   Specimen Description URINE, RANDOM  Final   Special Requests NONE  Final   Culture NO GROWTH  Final   Report Status 07/09/2016 FINAL  Final  Respiratory Panel by PCR     Status: None   Collection Time: 07/08/16 12:14 AM  Result Value Ref Range Status   Adenovirus NOT DETECTED NOT DETECTED Final   Coronavirus 229E NOT DETECTED NOT DETECTED Final   Coronavirus HKU1 NOT DETECTED NOT DETECTED Final   Coronavirus NL63 NOT DETECTED NOT DETECTED Final   Coronavirus OC43 NOT DETECTED NOT DETECTED Final   Metapneumovirus NOT DETECTED NOT DETECTED Final   Rhinovirus / Enterovirus NOT DETECTED NOT DETECTED Final   Influenza A NOT DETECTED NOT DETECTED Final   Influenza B NOT DETECTED NOT DETECTED Final   Parainfluenza Virus 1 NOT DETECTED NOT  DETECTED Final   Parainfluenza Virus 2 NOT DETECTED NOT DETECTED Final   Parainfluenza Virus 3 NOT DETECTED NOT DETECTED Final   Parainfluenza Virus 4 NOT DETECTED NOT DETECTED Final   Respiratory Syncytial Virus NOT DETECTED NOT DETECTED Final   Bordetella pertussis NOT DETECTED NOT DETECTED Final   Chlamydophila pneumoniae NOT DETECTED NOT DETECTED Final   Mycoplasma pneumoniae NOT DETECTED NOT DETECTED Final  C difficile quick scan w PCR reflex     Status: None   Collection Time: 07/08/16  1:28 AM  Result Value Ref Range Status   C Diff antigen NEGATIVE NEGATIVE Final   C Diff toxin NEGATIVE NEGATIVE Final   C Diff interpretation No C. difficile detected.  Final  Culture, expectorated sputum-assessment     Status: None   Collection Time: 07/08/16  1:28 AM  Result Value Ref Range Status   Specimen Description EXPECTORATED SPUTUM  Final   Special Requests NONE  Final   Sputum evaluation   Final    MICROSCOPIC FINDINGS SUGGEST THAT THIS SPECIMEN IS NOT REPRESENTATIVE OF LOWER RESPIRATORY SECRETIONS. PLEASE RECOLLECT. Gram Stain Report Called to,Read Back By and Verified With: RN A.MCKEOWN [id number] MLM    Report Status 07/08/2016 FINAL  Final  MRSA PCR Screening     Status: None   Collection Time: 07/08/16  4:13 PM  Result Value Ref Range Status   MRSA by PCR NEGATIVE NEGATIVE Final    Comment:        The GeneXpert MRSA Assay (FDA approved for NASAL specimens only), is one component of a comprehensive MRSA colonization surveillance program. It is not intended to diagnose MRSA infection nor to guide or monitor treatment for MRSA infections.   Culture, blood (Routine X 2) w Reflex to ID Panel     Status: None (Preliminary result)   Collection Time: 07/10/16  3:42 PM  Result Value Ref Range Status   Specimen Description BLOOD RIGHT HAND  Final   Special Requests BOTTLES DRAWN AEROBIC ONLY 5CC  Final   Culture NO GROWTH 2 DAYS  Final   Report Status PENDING  Incomplete   Culture, blood (routine x 2)     Status: None (Preliminary result)   Collection Time: 07/10/16  3:47 PM  Result Value Ref Range Status   Specimen  Description BLOOD RIGHT ARM  Final   Special Requests BOTTLES DRAWN AEROBIC ONLY 5CC  Final   Culture NO GROWTH 2 DAYS  Final   Report Status PENDING  Incomplete     Labs: BNP (last 3 results) No results for input(s): BNP in the last 8760 hours. Basic Metabolic Panel:  Recent Labs Lab 07/09/16 0300 07/10/16 0228 07/11/16 0832 07/12/16 0307 07/13/16 0148  NA 140 141 139 137 134*  K 3.6 3.4* 4.0 3.4* 4.3  CL 113* 115* 112* 108 106  CO2 19* 18* 22 21* 23  GLUCOSE 114* 140* 88 93 91  BUN 61* 58* 38* 28* 23*  CREATININE 3.37* 2.24* 1.46* 1.29* 1.18  CALCIUM 7.8* 8.0* 8.4* 8.5* 8.5*  MG  --   --   --   --  1.4*   Liver Function Tests:  Recent Labs Lab 07/07/16 2015 07/09/16 0300  AST 52* 75*  ALT 31 31  ALKPHOS 41 33*  BILITOT 0.2* 0.5  PROT 7.9 6.7  ALBUMIN 3.3* 2.5*   No results for input(s): LIPASE, AMYLASE in the last 168 hours. No results for input(s): AMMONIA in the last 168 hours. CBC:  Recent Labs Lab 07/07/16 2015 07/08/16 0250 07/09/16 0300 07/10/16 0228 07/11/16 0832  WBC 11.0* 11.9* 6.6 4.6 7.9  NEUTROABS 9.0*  --   --   --   --   HGB 8.5* 8.5* 7.1* 8.3* 8.4*  HCT 27.9* 28.1* 23.7* 26.7* 26.0*  MCV 77.5* 78.1 78.2 77.4* 75.6*  PLT 353 311 294 261 246   CBG:  Recent Labs Lab 07/10/16 0747  GLUCAP 111*   Urinalysis    Component Value Date/Time   COLORURINE AMBER (A) 07/07/2016 2059   APPEARANCEUR TURBID (A) 07/07/2016 2059   LABSPEC 1.021 07/07/2016 2059   PHURINE 5.0 07/07/2016 2059   GLUCOSEU NEGATIVE 07/07/2016 2059   HGBUR LARGE (A) 07/07/2016 2059   BILIRUBINUR SMALL (A) 07/07/2016 2059   KETONESUR 15 (A) 07/07/2016 2059   PROTEINUR 100 (A) 07/07/2016 2059   UROBILINOGEN 0.2 07/16/2014 1720   NITRITE NEGATIVE 07/07/2016 2059   LEUKOCYTESUR LARGE (A) 07/07/2016 2059      Time  coordinating discharge: Over 30 minutes  SIGNED:  Vernell Leep, MD, FACP, FHM. [facility] Pager [phone]  If 7PM-7AM, please contact night-coverage [email or web address] Password [email or web address] 07/13/2016, 12:06 PM

## 2016-07-13 NOTE — Evaluation (Signed)
Occupational Therapy Evaluation Patient Details Name: [name] MRN: [id number] DOB: 05-09-1951 Today's Date: 07/13/2016    History of Present Illness Pt is a 65 y/o male admitted secondary to sepsis. PMH including but not limited to multiple CVA's (2012, 2014, 2015), seizures, COPD, CKD and prostate cancer   Clinical Impression   Patient evaluated by Occupational Therapy with no further acute OT needs identified. All further OT to be defer to SNF level care. Pt able to (A) with transfers and reports pending d/c back to SNF today.  See below for any follow-up Occupational Therapy or equipment needs. OT to sign off. Thank you for referral.      Follow Up Recommendations  SNF    Equipment Recommendations  Wheelchair (measurements OT);Wheelchair cushion (measurements OT);Other (comment);Hospital bed;3 in 1 bedside comode (sliding board)    Recommendations for Other Services       Precautions / Restrictions Precautions Precautions: Fall      Mobility Bed Mobility Overal bed mobility: Needs Assistance Bed Mobility: Supine to Sit     Supine to sit: Mod assist     General bed mobility comments: Pt requires pad use to scoot L hip to EOB for static EOB sitting. ONce EOB able to use bil UE to maintain balance  Transfers Overall transfer level: Needs assistance Equipment used: 1 person hand held assist Transfers: Squat Pivot Transfers     Squat pivot transfers: Max assist     General transfer comment: Pt exiting R side with L LE blocked pt able to elevate buttock of bed surface and swing hips to chaiir. Pt once on edge of chair using bil UE to elevate buttock and position to center of chair     Balance Overall balance assessment: Needs assistance Sitting-balance support: Bilateral upper extremity supported;Feet supported Sitting balance-Leahy Scale: Good                                      ADL Overall ADL's : Needs  assistance/impaired Eating/Feeding: Independent   Grooming: Wash/dry hands;Supervision/safety   Upper Body Bathing: Supervision/ safety;Bed level   Lower Body Bathing: Moderate assistance           Toilet Transfer: Maximal assistance Toilet Transfer Details (indicate cue type and reason): sliding transfer EOB to chair to simulate R side           General ADL Comments: pt completed EOB to chair transfer     Vision Additional Comments: not assessed at this time. Scanning environment and tracking therapist throughout session   Perception     Praxis      Pertinent Vitals/Pain Pain Assessment: No/denies pain     Hand Dominance Right   Extremity/Trunk Assessment Upper Extremity Assessment Upper Extremity Assessment: Generalized weakness   Lower Extremity Assessment Lower Extremity Assessment: Defer to PT evaluation LLE Deficits / Details: L LE with grown at knee and maintains knee flexion without full extension during session   Cervical / Trunk Assessment Cervical / Trunk Assessment: Kyphotic   Communication Communication Communication: No difficulties   Cognition Arousal/Alertness: Awake/alert Behavior During Therapy: WFL for tasks assessed/performed Overall Cognitive Status: No family/caregiver present to determine baseline cognitive functioning                     General Comments       Exercises       Shoulder Instructions  Home Living Family/patient expects to be discharged to:: Skilled nursing facility                                        Prior Functioning/Environment Level of Independence: Needs assistance  Gait / Transfers Assistance Needed: Pt reports to OT that he completes stand pivot on R LE to chair. Pt able to elevate buttock from bed surface but completed a sliding scoot to chair with anterior weight shift with therapist              OT Problem List:     OT Treatment/Interventions:      OT  Goals(Current goals can be found in the care plan section) Acute Rehab OT Goals Patient Stated Goal: return to [facility] Potential to Achieve Goals: Good  OT Frequency:     Barriers to D/C:            Co-evaluation              End of Session Equipment Utilized During Treatment: Gait belt Nurse Communication: Mobility status;Precautions  Activity Tolerance: Patient tolerated treatment well Patient left: in chair;with call bell/phone within reach;with chair alarm set   Time: [id number] OT Time Calculation (min): 13 min Charges:  OT General Charges $OT Visit: 1 Procedure OT Evaluation $OT Eval Moderate Complexity: 1 Procedure G-Codes:    Parke Poisson B Aug 11, 2016, 10:21 AM

## 2016-07-13 NOTE — Progress Notes (Signed)
Speech Language Pathology Treatment: Dysphagia  Patient Details Name: [name] MRN: [id number] DOB: 1951-03-21 Today's Date: 07/13/2016 Time: 3833-3832 SLP Time Calculation (min) (ACUTE ONLY): 9 min  Assessment / Plan / Recommendation Clinical Impression  Pt with good intake at breakfast per RN.  Prefers dysphagia 2 to the pureed diet that he has been eating for an unknown period of time.  Tolerating liquids with no overt s/s of aspiration.  Lungs are clear.  Independent with eating.  Recommend D/C on current diet.  No SLP f/u warranted.  Our services will sign off.    HPI HPI: 65 y.o.malewith medical history significant of Stroke, prostate cancer with ? Bone mets, MGUS, chronic anemia. At baseline he resides at a SNF. Dx Sepsis, COPD exacerbation, Acute on chronic renal failure.  Pt has been followed intermittently over the years by SLP services for acute dysphagia, generally resolves when medical condition improves.  MBS 2014 revealed likely anterior cervical osteophytes that have the potential to interfere with swallowing.       SLP Plan  All goals met     Recommendations  Diet recommendations: Dysphagia 2 (fine chop);Thin liquid Liquids provided via: Cup;Straw Medication Administration: Whole meds with puree Supervision: Patient able to self feed                Oral Care Recommendations: Oral care BID Plan: All goals met       GO                Juan Quam [name] 07/13/2016, 10:46 AM

## 2016-07-13 NOTE — Progress Notes (Deleted)
Called to give report and nurse was not able to take report at this time she will call back.

## 2016-07-16 LAB — CULTURE, BLOOD (ROUTINE X 2)
Culture: NO GROWTH
Culture: NO GROWTH

## 2016-08-07 ENCOUNTER — Telehealth: Payer: Self-pay | Admitting: Hematology

## 2016-08-07 ENCOUNTER — Telehealth: Payer: Self-pay | Admitting: *Deleted

## 2016-08-07 DIAGNOSIS — D509 Iron deficiency anemia, unspecified: Secondary | ICD-10-CM

## 2016-08-07 NOTE — Telephone Encounter (Signed)
Unable to contact pt and caregiver to inform of 1/11 appt date/time per LOS. Made several attempts but line keeps ringing busy. Home number is not in service. Letter mailed to pt 12/29

## 2016-08-07 NOTE — Telephone Encounter (Signed)
Pt's relative called to schedule MD apt per outside physician.  Relative stated pt's "blood is low again and his doctor wanted him to see the hematologist."  Per last office note, pt to RTC for new questions/concerns.  Scheduling message sent for 1/10 @ 3pm.

## 2016-08-19 ENCOUNTER — Encounter: Payer: Self-pay | Admitting: Hematology

## 2016-08-19 ENCOUNTER — Other Ambulatory Visit (HOSPITAL_BASED_OUTPATIENT_CLINIC_OR_DEPARTMENT_OTHER): Payer: Medicare Other

## 2016-08-19 ENCOUNTER — Telehealth: Payer: Self-pay | Admitting: Hematology

## 2016-08-19 ENCOUNTER — Ambulatory Visit (HOSPITAL_BASED_OUTPATIENT_CLINIC_OR_DEPARTMENT_OTHER): Payer: Medicare Other | Admitting: Hematology

## 2016-08-19 VITALS — BP 118/61 | HR 71 | Temp 97.8°F | Resp 18 | Ht 68.0 in

## 2016-08-19 DIAGNOSIS — D631 Anemia in chronic kidney disease: Secondary | ICD-10-CM

## 2016-08-19 DIAGNOSIS — N189 Chronic kidney disease, unspecified: Secondary | ICD-10-CM

## 2016-08-19 DIAGNOSIS — D509 Iron deficiency anemia, unspecified: Secondary | ICD-10-CM | POA: Diagnosis not present

## 2016-08-19 DIAGNOSIS — C7951 Secondary malignant neoplasm of bone: Secondary | ICD-10-CM

## 2016-08-19 DIAGNOSIS — C61 Malignant neoplasm of prostate: Secondary | ICD-10-CM

## 2016-08-19 DIAGNOSIS — D500 Iron deficiency anemia secondary to blood loss (chronic): Secondary | ICD-10-CM

## 2016-08-19 DIAGNOSIS — D630 Anemia in neoplastic disease: Secondary | ICD-10-CM

## 2016-08-19 DIAGNOSIS — D472 Monoclonal gammopathy: Secondary | ICD-10-CM | POA: Diagnosis not present

## 2016-08-19 LAB — COMPREHENSIVE METABOLIC PANEL
ALBUMIN: 3.5 g/dL (ref 3.5–5.0)
ALK PHOS: 68 U/L (ref 40–150)
ALT: 17 U/L (ref 0–55)
ANION GAP: 7 meq/L (ref 3–11)
AST: 20 U/L (ref 5–34)
BUN: 26.2 mg/dL — AB (ref 7.0–26.0)
CALCIUM: 10.1 mg/dL (ref 8.4–10.4)
CO2: 27 mEq/L (ref 22–29)
CREATININE: 1.4 mg/dL — AB (ref 0.7–1.3)
Chloride: 104 mEq/L (ref 98–109)
EGFR: 62 mL/min/{1.73_m2} — ABNORMAL LOW (ref >90)
Glucose: 98 mg/dl (ref 70–140)
POTASSIUM: 4.3 meq/L (ref 3.5–5.1)
Sodium: 138 mEq/L (ref 136–145)
Total Bilirubin: 0.22 mg/dL (ref 0.20–1.20)
Total Protein: 8.3 g/dL (ref 6.4–8.3)

## 2016-08-19 LAB — CBC WITH DIFFERENTIAL/PLATELET
BASO%: 0.4 % (ref 0.0–2.0)
BASOS ABS: 0 10*3/uL (ref 0.0–0.1)
EOS%: 0.8 % (ref 0.0–7.0)
Eosinophils Absolute: 0.1 10*3/uL (ref 0.0–0.5)
HEMATOCRIT: 29.9 % — AB (ref 38.4–49.9)
HEMOGLOBIN: 9.5 g/dL — AB (ref 13.0–17.1)
LYMPH#: 1.9 10*3/uL (ref 0.9–3.3)
LYMPH%: 21.6 % (ref 14.0–49.0)
MCH: 25.4 pg — ABNORMAL LOW (ref 27.2–33.4)
MCHC: 31.8 g/dL — ABNORMAL LOW (ref 32.0–36.0)
MCV: 79.9 fL (ref 79.3–98.0)
MONO#: 0.9 10*3/uL (ref 0.1–0.9)
MONO%: 10.5 % (ref 0.0–14.0)
NEUT#: 5.9 10*3/uL (ref 1.5–6.5)
NEUT%: 66.7 % (ref 39.0–75.0)
PLATELETS: 305 10*3/uL (ref 140–400)
RBC: 3.75 10*6/uL — ABNORMAL LOW (ref 4.20–5.82)
RDW: 20.2 % — AB (ref 11.0–14.6)
WBC: 8.9 10*3/uL (ref 4.0–10.3)

## 2016-08-19 LAB — FERRITIN: FERRITIN: 43 ng/mL (ref 22–316)

## 2016-08-19 LAB — IRON AND TIBC
%SAT: 10 % — ABNORMAL LOW (ref 20–55)
IRON: 34 ug/dL — AB (ref 42–163)
TIBC: 346 ug/dL (ref 202–409)
UIBC: 312 ug/dL (ref 117–376)

## 2016-08-19 NOTE — Progress Notes (Signed)
[facility]    HEMATOLOGY/ONCOLOGY CLINIC NOTE  Date of Service: 08/19/2016  Patient Care Team: Wenda Low, MD as PCP - General (Internal Medicine)  CHIEF COMPLAINTS/PURPOSE OF CONSULTATION:  Anemia  HISTORY OF PRESENTING ILLNESS:  [name] is a wonderful 66 y.o. male who has been referred to [place] by Dr Benita Stabile for evaluation and management of Anemia.  Patient has a h/o HTN, COPD, CVA with left sided weakness, CKD stage II,  prostate cancer (patient reports this was diagnosed 42yr ago abd that he follows with [facility] and has been on lupron q372monthfor this ? Bone mets). He reports that he has lost a significant amount of weight but has been regaining his weight. Has been using 3 cans of boost daily in addition to meals. He notes his makes his own decision. Arrives in a wheelchair with transport assistant from his NH where he has been for the last 3 yrs.  He reports having needed PRBC transfusion 2 yrs ago but none recently. Notes fatigue. Denies overt GI bleeding/hematuria/epistaxis or other bleeding.  As per labs drwan at his NH in May 2017 his hgb was 7.3 with an MCV of 85.3, nl WBC count of 7.2k and low ferritin of 12.8 with iron saturation of 11%. He has had hgb in the mid 7 range in 11/2015 as well.  He reports no CP/overt SOB at rest. No dizziness or presyncopal symptoms at this time.  He has been on ferrous sulfate 1 tab po BID and folic acid replacement.  INTERVAL HISTORY  Patient is here after being referred again by his primary care physician for management of anemia. He was recently admitted to the hospital with somewhat less sepsis with acute renal failure and COPD exacerbation. He was noted to have multiple electrolyte issues as well. [facility]. His hemoglobin level today is somewhat improved to 9.5 from his recent levels at 7.1 on 07/09/2016. He continues to have  significant burden of other chronic medical issues including CVA with residual deficits. He continues to follow with his urologist for management of his metastatic prostate cancer.   MEDICAL HISTORY:  Past Medical History:  Diagnosis Date  . Cerebral embolism with cerebral infarction ([facility]   residual left sided weakness/notes 12/12/2013  . Chronic kidney disease (CKD), stage II (mild)    /nArchie Endo/12/2013  . Colon polyps 10/20/2013   TUBULAR ADENOMA (4).  . COPD 05/18/2007  . Elevated PSA 05/25/2007  . High cholesterol   . Hypertension 05/18/2007  . Pneumonia    "couple times" (07/08/2016)  . Prostate cancer ([facility]  . Protein-calorie malnutrition, severe ([facility]   /nArchie Endo/12/2013  . Seizures ([facility]; 12/08/2013; 12/11/2013   new onset; recurrent/notes 12/12/2013  . Sepsis associated hypotension ([facility]/2017   /nArchie Endo1/29/2017  . Stroke ([facility], 11/2012   Right centrum semiovale  . [facility]   "I've had about 5" (07/08/2016)    SURGICAL HISTORY: Past Surgical History:  Procedure Laterality Date  . COLONOSCOPY W/ BIOPSIES  2015   4 polyps, tubular adenomas, pan diverticulosis, one lipoma.   . ESOPHAGOGASTRODUODENOSCOPY N/A 06/06/2014   Procedure: ESOPHAGOGASTRODUODENOSCOPY (EGD);  Surgeon: CaGatha MayerMD;  [facility];  Service: Endoscopy;  Laterality: N/A;  . FRACTURE SURGERY    . MULTIPLE EXTRACTIONS WITH ALVEOLOPLASTY N/A 10/23/2013   Procedure: Extraction of tooth #'s 1,2,3,4,5,6,7,11,12,13,14,15,16,20,21,22,23,24,27,28,29,32 with alveoloplasty and bilateral maxillary tuberosity  reductions;  Surgeon: Lenn Cal, DDS;  Location: [facility];  Service: Oral Surgery;  Laterality: N/A;  . PEG TUBE PLACEMENT  12/2012   per IR  . TIBIA FRACTURE SURGERY Right    pin  . TOE AMPUTATION Right 2000s   "middle toe"; Gangrene  . TONSILLECTOMY  1956    SOCIAL HISTORY: Social History   Social History  . Marital status: Single    Spouse name: N/A    . Number of children: 0  . Years of education: N/A   Occupational History  . Flooring instalation [employer]    Retired   Social History Main Topics  . Smoking status: Current Some Day Smoker    Packs/day: 1.00    Years: 17.00    Types: Cigarettes  . Smokeless tobacco: Never Used     Comment: 07/08/2016 "started smoking when I was 48"  . Alcohol use Yes     Comment: 07/08/2016 "drank 1 bottle of wine/day before I went into nursing home"  . Drug use: No     Comment: history of cocaine use  . Sexual activity: Not Currently   Other Topics Concern  . Not on file   Social History Narrative   Used to drink 1/2 pt of wine daily and beer daily (at least 12 oz).  as of 06/03/14 denies recent ETOH.    Denies recreational drugs but tox screen + for cocaine in past.     Smokes 3-4 cigarettes. 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    2 years of college at [employer] per patient                 FAMILY HISTORY: Family History  Problem Relation Age of Onset  . Liver disease Mother   . Heart attack Mother 52  . Colon cancer Neg Hx     ALLERGIES:  is allergic to other; aspirin; and penicillins.  MEDICATIONS:  Current Outpatient Prescriptions  Medication Sig Dispense Refill  . albuterol (PROVENTIL HFA;VENTOLIN HFA) 108 (90 BASE) MCG/ACT inhaler Inhale 2 puffs into the lungs every 6 (six) hours as needed for wheezing. 8.5 g 3  . amLODipine (NORVASC) 10 MG tablet Take 1 tablet (10 mg total) by mouth daily.    Marland Kitchen atorvastatin (LIPITOR) 10 MG tablet Take 10 mg by mouth daily at 6 PM.    . Calcium Carbonate-Vitamin D (CALTRATE 600+D) 600-400 MG-UNIT tablet Take 1 tablet by mouth 2 (two) times daily.    . chlorhexidine (PERIDEX) 0.12 % solution Use as directed 15 mLs in the mouth or throat daily.    . cloNIDine (CATAPRES) 0.1 MG tablet Take 0.1 mg by mouth 2 (two) times daily as needed (for SBP >160 or DBP >95).    Marland Kitchen clopidogrel  (PLAVIX) 75 MG tablet Take 1 tablet (75 mg total) by mouth daily with breakfast. 90 tablet 1  . folic acid (FOLVITE) 1 MG tablet Take 1 tablet (1 mg total) by mouth daily. 30 tablet 5  . levETIRAcetam (KEPPRA) 500 MG tablet Take 1,500 mg by mouth 2 (two) times daily.    . Multiple Vitamin (MULTIVITAMIN WITH MINERALS) TABS tablet Take 1 tablet by mouth daily.    Marland Kitchen OVER THE COUNTER MEDICATION Take 1 Container by mouth 3 (three) times daily. Magic cup    . pantoprazole (PROTONIX) 40 MG tablet Take 1 tablet (40 mg total) by mouth daily. 90 tablet 1  . predniSONE (  DELTASONE) 10 MG tablet Take 3 tabs daily for 2 days, then 2 tabs daily for 2 days, then 1 tab daily for 2 days, then stop.    Marland Kitchen tiotropium (SPIRIVA HANDIHALER) 18 MCG inhalation capsule Place 1 capsule (18 mcg total) into inhaler and inhale daily. 90 capsule 4   No current facility-administered medications for this visit.     REVIEW OF SYSTEMS:    10 Point review of Systems was done is negative except as noted above.  PHYSICAL EXAMINATION: ECOG PERFORMANCE STATUS: 3 - Symptomatic, >50% confined to bed  . Vitals:   08/19/16 1427  BP: 118/61  Pulse: 71  Resp: 18  Temp: 97.8 F (36.6 C)   There were no vitals filed for this visit. .There is no height or weight on file to calculate BMI.  GENERAL:alert, in no acute distress and comfortable sitting in wheelchair. Speech somewhat difficult to understand due to CVA SKIN: skin color, texture, turgor are normal, no rashes or significant lesions EYES: normal, conjunctiva are pink and non-injected, sclera clear OROPHARYNX:no exudate, no erythema and lips, buccal mucosa, and tongue normal  NECK: supple, no JVD, thyroid normal size, non-tender, without nodularity LYMPH:  no palpable lymphadenopathy in the cervical, axillary or inguinal LUNGS: clear to auscultation with normal respiratory effort HEART: regular rate & rhythm,  no murmurs and no lower extremity edema ABDOMEN: abdomen  soft, non-tender, normoactive bowel sounds  PSYCH: alert & oriented x 3 with speech that if somewhat altered and difficult to understand but understandable with attention. NEURO: left sided weakness - detailed neuro examination not done  LABORATORY DATA:  I have reviewed the data as listed  . CBC Latest Ref Rng & Units 08/19/2016 07/11/2016 07/10/2016  WBC 4.0 - 10.3 10e3/uL 8.9 7.9 4.6  Hemoglobin 13.0 - 17.1 g/dL 9.5(L) 8.4(L) 8.3(L)  Hematocrit 38.4 - 49.9 % 29.9(L) 26.0(L) 26.7(L)  Platelets 140 - 400 10e3/uL 305 246 261    .Marland Kitchen CBC    Component Value Date/Time   WBC 8.9 08/19/2016 1337   WBC 7.9 07/11/2016 0832   RBC 3.75 (L) 08/19/2016 1337   RBC 3.44 (L) 07/11/2016 0832   HGB 9.5 (L) 08/19/2016 1337   HCT 29.9 (L) 08/19/2016 1337   PLT 305 08/19/2016 1337   MCV 79.9 08/19/2016 1337   MCH 25.4 (L) 08/19/2016 1337   MCH 24.4 (L) 07/11/2016 0832   MCHC 31.8 (L) 08/19/2016 1337   MCHC 32.3 07/11/2016 0832   RDW 20.2 (H) 08/19/2016 1337   LYMPHSABS 1.9 08/19/2016 1337   MONOABS 0.9 08/19/2016 1337   EOSABS 0.1 08/19/2016 1337   BASOSABS 0.0 08/19/2016 1337     CMP Latest Ref Rng & Units 08/19/2016 07/13/2016 07/12/2016  Glucose 70 - 140 mg/dl 98 91 93  BUN 7.0 - 26.0 mg/dL 26.2(H) 23(H) 28(H)  Creatinine 0.7 - 1.3 mg/dL 1.4(H) 1.18 1.29(H)  Sodium 136 - 145 mEq/L 138 134(L) 137  Potassium 3.5 - 5.1 mEq/L 4.3 4.3 3.4(L)  Chloride 101 - 111 mmol/L - 106 108  CO2 22 - 29 mEq/L 27 23 21(L)  Calcium 8.4 - 10.4 mg/dL 10.1 8.5(L) 8.5(L)  Total Protein 6.4 - 8.3 g/dL 8.3 - -  Total Bilirubin 0.20 - 1.20 mg/dL 0.22 - -  Alkaline Phos 40 - 150 U/L 68 - -  AST 5 - 34 U/L 20 - -  ALT 0 - 55 U/L 17 - -   Component     Latest Ref Rng 01/23/2016  IgG (Immunoglobin G),  Serum     700 - 1600 mg/dL 2,715 (H)  IgA/Immunoglobulin A, Serum     61 - 437 mg/dL 278  IgM, Qn, Serum     20 - 172 mg/dL 102  Total Protein     6.0 - 8.5 g/dL 8.0  Albumin SerPl Elph-Mcnc     2.9 - 4.4 g/dL  3.7  Alpha 1     0.0 - 0.4 g/dL 0.2  Alpha2 Glob SerPl Elph-Mcnc     0.4 - 1.0 g/dL 0.6  B-Globulin SerPl Elph-Mcnc     0.7 - 1.3 g/dL 1.1  Gamma Glob SerPl Elph-Mcnc     0.4 - 1.8 g/dL 2.4 (H)  M Protein SerPl Elph-Mcnc     Not Observed g/dL 0.8 (H)  Globulin, Total     2.2 - 3.9 g/dL 4.3 (H)  Albumin/Glob SerPl     0.7 - 1.7 0.9  IFE 1      Comment  Please Note (HCV):      Comment  Iron     42 - 163 ug/dL 50  TIBC     202 - 409 ug/dL 401  UIBC     117 - 376 ug/dL 351  %SAT     20 - 55 % 13 (L)  Ferritin     22 - 316 ng/ml 18 (L)  PSA     0.0 - 4.0 ng/mL 1.7  Vitamin B12     211 - 946 pg/mL 853   Comments: Immunofixation shows IgG monoclonal protein with lambda light chain  Specificity.  . Lab Results  Component Value Date   IRON 34 (L) 08/19/2016   TIBC 346 08/19/2016   IRONPCTSAT 10 (L) 08/19/2016   (Iron and TIBC)  Lab Results  Component Value Date   FERRITIN 43 08/19/2016    RADIOGRAPHIC STUDIES: I have personally reviewed the radiological images as listed and agreed with the findings in the report. No results found.  ASSESSMENT & PLAN:   66 yo with multiple medical co-morbids and poor functional status due to CVA with left sided weakness with   1) Normocytic Normochromic Anemia - likely multifactorial Noted to have significant Iron deficiency despite being on oral iron replacement. Unclear source of bleeding. patient received IV Feraheme and his ferritin is within normal limits. His hemoglobin appears better from 8.3 upto 10.2 Also has Anemia of Chronic disease from CKD and his metastatic Prostate cancer Anemia from anti-androgen therapy Anemia for poor nutritional status and recent sepsis and hospitalization with acute renal failure.  Patient's hemoglobin is 9.5 today to somewhat better than 7.1 on 07/09/2016. 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 Less likely Myeloma   PLAN -We will  set him up for IV Feraheme 510 mg every weekly 2 doses. -The goal is to keep his ferritin >100 and the setting of chronic kidney disease and prostate cancer -if still anemic with hgb < 10 might need to consider ESA's for his CKD. -rpt SPEP on his next visit to monitor for MGUS. -prn PRBC transfusion for symptomatic anemia - not indicated at this time -Given his overall condition with extensive burden of other medical comorbidities and goals of care will hold off on additional workup including bone marrow biopsy.  3) Prostate cancer - unclear details. Patient reports he is currently on lupron as per [facility]. Last PSA level showed response to Lupron  Plan -continue f/u with [facility]. -  Will check PSA next visit.  RTC FOR IV FERAHEME EVERY WEEK 2  RTC with Dr Irene Limbo in 3 months with repeat labs   All of the patients questions were answered with apparent satisfaction. The patient knows to call the clinic with any problems, questions or concerns.  I spent 20 minutes counseling the patient face to face. The total time spent in the appointment was 25 minutes and more than 50% was on counseling and direct patient cares.    Sullivan Lone MD [place] AAHIVMS [facility] Hematology/Oncology Physician [facility]  (Office):       [phone] (Work cell):  [phone] (Fax):           [phone]

## 2016-08-19 NOTE — Telephone Encounter (Signed)
Appointments scheduled per 1/10 LOS. Patient given AVS report and calendars with future scheduled appointments. °

## 2016-08-21 ENCOUNTER — Other Ambulatory Visit: Payer: Self-pay | Admitting: *Deleted

## 2016-08-21 ENCOUNTER — Telehealth: Payer: Self-pay | Admitting: Hematology

## 2016-08-21 NOTE — Telephone Encounter (Signed)
Called to confirm infusion appts for next week. Pt relative stated that she will try and contact pt to give the office back a call to confirm appts date/times. Primary contact number continues to ring busy.

## 2016-08-24 ENCOUNTER — Ambulatory Visit: Payer: Self-pay

## 2016-08-31 ENCOUNTER — Telehealth: Payer: Self-pay | Admitting: Hematology

## 2016-08-31 ENCOUNTER — Ambulatory Visit: Payer: Self-pay

## 2016-08-31 NOTE — Telephone Encounter (Signed)
Not able to reach patient or [facility] re appointments. Schedule mailed. Also I was hung up on when calling patient's number.

## 2016-09-07 ENCOUNTER — Ambulatory Visit: Payer: Self-pay

## 2016-09-08 ENCOUNTER — Encounter: Payer: Self-pay | Admitting: Internal Medicine

## 2016-09-14 ENCOUNTER — Ambulatory Visit: Payer: Self-pay

## 2016-09-23 ENCOUNTER — Telehealth: Payer: Self-pay | Admitting: *Deleted

## 2016-09-23 NOTE — Telephone Encounter (Signed)
Received message from [place] at outside facility stating pt needed to be seen asap due to abnormal lab results (not specified in message).  Per chart pt was scheduled multiple times for recent feraheme infusions and was a no show.  This RN returned call to Dorothy Puffer with no answer, no voice mail available.  Number provided: [phone]

## 2016-10-01 ENCOUNTER — Encounter: Payer: Self-pay | Admitting: Physician Assistant

## 2016-10-01 ENCOUNTER — Ambulatory Visit (INDEPENDENT_AMBULATORY_CARE_PROVIDER_SITE_OTHER): Payer: Medicare Other | Admitting: Physician Assistant

## 2016-10-01 VITALS — BP 120/78 | HR 68 | Wt 180.0 lb

## 2016-10-01 DIAGNOSIS — A09 Infectious gastroenteritis and colitis, unspecified: Secondary | ICD-10-CM

## 2016-10-01 DIAGNOSIS — R197 Diarrhea, unspecified: Secondary | ICD-10-CM

## 2016-10-01 NOTE — Patient Instructions (Signed)
If you are age 68 or older, your body mass index should be between 23-30. Your Body mass index is 27.37 kg/m. If this is out of the aforementioned range listed, please consider follow up with your Primary Care Provider.  If you are age 79 or younger, your body mass index should be between 19-25. Your Body mass index is 27.37 kg/m. If this is out of the aformentioned range listed, please consider follow up with your Primary Care Provider.   Follow up as needed.  Thank you for choosing [facility]  Dr Wilfrid Lund III

## 2016-10-01 NOTE — Progress Notes (Addendum)
Subjective:    Patient ID: [name], male    DOB: August 24, 1950, 66 y.o.   MRN: [id number]  HPI [name] is a 66 year old African-American male, established with Dr. Lynnell Chad He has multiple comorbidities and resides in a nursing facility. He has history of previous CVAs, metastatic prostate cancer, prior history of EtOH abuse, chronic kidney disease, COPD, and hypertension. He underwent recent evaluation by her per Dr. Lenon Curt for a normocytic anemia/iron deficient which is felt multifactorial and is being replaced with fair HEENT. He had colonoscopy in March 2015 with finding of several sessile polyps the largest 5-6 mm and moderate diverticulosis. Path on the polyps consistent with tubular adenomas and he is indicated for 5 year interval follow-up. He had EGD in October 2015 pertinent for duodenitis. Patient was referred here by Dr. [facility] for evaluation of diarrhea. We do have copies of stool cultures done on February 8 which were negative and stool for C. difficile toxin also negative on February 8. Patient says he had diarrhea for about a week which has since resolved and has not recurred. He denies any abdominal pain or cramping or discomfort and says his appetite has been fine. He has been unaware of any melena or hematochezia and says his bowels are back to normal and have been back to normal at least over the past week.  Review of Systems Pertinent positive and negative review of systems were noted in the above HPI section.  All other review of systems was otherwise negative.  Outpatient Encounter Prescriptions as of 10/01/2016  Medication Sig  . albuterol (PROVENTIL HFA;VENTOLIN HFA) 108 (90 BASE) MCG/ACT inhaler Inhale 2 puffs into the lungs every 6 (six) hours as needed for wheezing.  Marland Kitchen amLODipine (NORVASC) 10 MG tablet Take 1 tablet (10 mg total) by mouth daily.  Marland Kitchen atorvastatin (LIPITOR) 10 MG tablet Take 10 mg by mouth daily at 6 PM.  .  Calcium Carbonate-Vitamin D (CALTRATE 600+D) 600-400 MG-UNIT tablet Take 1 tablet by mouth 2 (two) times daily.  . chlorhexidine (PERIDEX) 0.12 % solution Use as directed 15 mLs in the mouth or throat daily.  . cloNIDine (CATAPRES) 0.1 MG tablet Take 0.1 mg by mouth 2 (two) times daily as needed (for SBP >160 or DBP >95).  Marland Kitchen clopidogrel (PLAVIX) 75 MG tablet Take 1 tablet (75 mg total) by mouth daily with breakfast.  . folic acid (FOLVITE) 1 MG tablet Take 1 tablet (1 mg total) by mouth daily.  Marland Kitchen levETIRAcetam (KEPPRA) 500 MG tablet Take 1,500 mg by mouth 2 (two) times daily.  . magnesium oxide (MAG-OX) 400 MG tablet Take 400 mg by mouth daily.  . Multiple Vitamin (MULTIVITAMIN WITH MINERALS) TABS tablet Take 1 tablet by mouth daily.  . Multiple Vitamins-Minerals (CERTAGEN PO) Take by mouth.  Marland Kitchen OVER THE COUNTER MEDICATION Take 1 Container by mouth 3 (three) times daily. Magic cup  . pantoprazole (PROTONIX) 40 MG tablet Take 1 tablet (40 mg total) by mouth daily.  . predniSONE (DELTASONE) 10 MG tablet Take 3 tabs daily for 2 days, then 2 tabs daily for 2 days, then 1 tab daily for 2 days, then stop.  Marland Kitchen senna (SENOKOT) 8.6 MG tablet Take 1 tablet by mouth daily.  Marland Kitchen tiotropium (SPIRIVA HANDIHALER) 18 MCG inhalation capsule Place 1 capsule (18 mcg total) into inhaler and inhale daily.   No facility-administered encounter medications on file as of 10/01/2016.    Allergies  Allergen Reactions  . Other  Other (See Comments)    Epidural caused seizure  . Aspirin Hives  . Penicillins Hives   Patient Active Problem List   Diagnosis Date Noted  . AKI (acute kidney injury) ([place])   . HCAP (healthcare-associated pneumonia)   . Diarrhea of presumed infectious origin 07/08/2016  . CAP (community acquired pneumonia) 07/08/2016  . Sepsis associated hypotension ([place]) 07/07/2016  . Iron deficiency anemia due to chronic blood loss 01/23/2016  . Anemia in neoplastic disease 01/23/2016  . Prostate cancer  metastatic to bone ([place]) 01/23/2016  . Weakness   . Palliative care encounter 07/17/2014  . Decreased appetite 07/17/2014  . Increased anion gap metabolic acidosis [id number]  . Acute on chronic renal failure ([place]) 07/16/2014  . Duodenitis 06/06/2014  . Seizure ([place]) 06/03/2014  . GI bleeding 06/03/2014  . Acute blood loss anemia 06/03/2014  . Occult blood in stools 05/14/2014  . Physical deconditioning 02/07/2014  . Anemia, iron deficiency 12/13/2013  . Anemia, chronic disease 12/12/2013  . Protein-calorie malnutrition, severe ([place]) 12/04/2013  . Seizures ([place]) 12/02/2013  . Acute-on-chronic kidney injury ([place]) 12/02/2013  . Left-sided weakness, residual s/p cva 11/24/2013  . CVA (cerebral vascular accident) ([place]) 11/23/2013  . Acute ischemic stroke ([place]) 11/02/2013  . Generalized weakness 10/24/2013  . Aftercare following surgery of teeth, oral cavity or digestive system 10/23/2013  . Routine health maintenance 10/10/2013  . Prostate cancer ([place]) 06/19/2013  . Localized swelling, mass, or lump of lower extremity 04/21/2013  . Stroke, acute, embolic ([place]) [id number]  . Cerebral embolism with cerebral infarction ([place]) 12/05/2012  . Dyslipidemia 12/05/2012  . Alcohol abuse 12/05/2012  . Claudication, intermittent ([place]) 11/07/2012  . Tobacco abuse 10/18/2012  . History of stroke 10/14/2010  . Elevated PSA 05/25/2007  . Hypertension 05/18/2007  . COPD 05/18/2007   Social History   Social History  . Marital status: Single    Spouse name: N/A  . Number of children: 0  . Years of education: N/A   Occupational History  . Flooring instalation [employer]    Retired   Social History Main Topics  . Smoking status: Current Some Day Smoker    Packs/day: 1.00    Years: 17.00    Types: Cigarettes  . Smokeless tobacco: Never Used     Comment: 07/08/2016 "started smoking when I was 48"  . Alcohol use No     Comment: 07/08/2016 "drank 1 bottle of wine/day before I went  into nursing home"  . Drug use: No     Comment: history of cocaine use  . Sexual activity: Not Currently   Other Topics Concern  . Not on file   Social History Narrative   Used to drink 1/2 pt of wine daily and beer daily (at least 12 oz).  as of 06/03/14 denies recent ETOH.    Denies recreational drugs but tox screen + for cocaine in past.     Smokes 3-4 cigarettes. 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    2 years of college at [employer] per patient                 Mr. Yahr family history includes Heart attack (age of onset: 41) in his mother; Liver disease in his mother.      Objective:    Vitals:   10/01/16 1349  BP: 120/78  Pulse: 68    Physical Exam well-developed older African-American male in no acute distress, he  is in a wheelchair. Blood pressure 120/78 pulse 68, weight 180 BMI 27.3. HEENT; nontraumatic normocephalic EOMI PERRLA sclera anicteric, Cardiovascular ;regular rate and rhythm with S1-S2, Pulmonary ;clear bilaterally, Abdomen; soft nontender nondistended bowel sounds are active there is no palpable mass or hepatosplenomegaly, Rectal ;exam not done, Neuropsych; mood and affect appropriate, speech is slightly slurred, nonambulatory       Assessment & Plan:   #88 66 year old African-American male, nursing home resident third with an acute diarrheal illness which has resolved. Stool cultures and stool for C. difficile on 09/17/2016 all negative He may have had a viral gastroenteritis. Patient is currently asymptomatic #2 history of tubular adenomatous polyps-due for follow-up colonoscopy 2020 #3 history of CVAs #4 chronic anticoagulation #5 chronic kidney disease #6 metastatic prostate cancer   #7 COPD  Plan; no further workup indicated at this time as symptoms have completely resolved Happy to see patient back as needed with Dr. Hilarie Fredrickson or myself.  Rilynne Lonsway S Kalab Camps PA-C 10/01/2016   Cc: Wenda Low, MD  Addendum: Reviewed and agree with management. Jerene Bears, MD

## 2016-11-13 ENCOUNTER — Encounter: Payer: Self-pay | Admitting: *Deleted

## 2016-11-17 ENCOUNTER — Other Ambulatory Visit: Payer: Self-pay

## 2016-11-17 ENCOUNTER — Ambulatory Visit: Payer: Self-pay | Admitting: Hematology

## 2016-12-04 ENCOUNTER — Ambulatory Visit (INDEPENDENT_AMBULATORY_CARE_PROVIDER_SITE_OTHER): Payer: Medicare Other | Admitting: Internal Medicine

## 2016-12-04 ENCOUNTER — Encounter: Payer: Self-pay | Admitting: Internal Medicine

## 2016-12-04 VITALS — BP 118/68 | Wt 181.0 lb

## 2016-12-04 DIAGNOSIS — Z862 Personal history of diseases of the blood and blood-forming organs and certain disorders involving the immune mechanism: Secondary | ICD-10-CM

## 2016-12-04 DIAGNOSIS — Z86010 Personal history of colonic polyps: Secondary | ICD-10-CM | POA: Diagnosis not present

## 2016-12-04 NOTE — Patient Instructions (Signed)
Follow up as needed.  CC: [employer]

## 2016-12-04 NOTE — Progress Notes (Signed)
Subjective:     Patient ID: [name], male   DOB: 03/20/1951, 66 y.o.   MRN: [id number]  HPI [name] is a 66 year old male with a history of adenomatous colon polyps, multifactorial anemia including iron deficiency anemia receiving IV iron through hematology, COPD, CVA with left-sided weakness, prostate cancer, CTD stage II, and hypertension who is here to discuss surveillance colonoscopy. He is here today with staff from his long-term care facility.  He was seen 2 months ago for an acute diarrheal illness which resolved without therapy. He was seen by Nicoletta Ba, PA-C on 10/01/2016.  He reports his diarrhea has resolved completely. He denies GI complaint today. Denies heartburn, trouble swallowing. He does eat a pured diet since his CVA some years ago. He also has had all of his teeth extracted making chewing solid food difficult. He denies abdominal pain. Reports bowel habits have returned to normal without diarrhea, constipation, blood in his stool or melena. Per med list he is treated with pantoprazole daily and also senna as needed for constipation  His last colonoscopy was performed 3 years ago by me. This revealed 4 polyps greatest size 6 mm. Medium lipoma at hepatic flexure and moderate diverticulosis. These polyps were tubular adenomas without high-grade dysplasia.  Last EGD was performed in October 2015 for IDA by Dr. Katha Cabal during hospitalization. This was normal. CLO biopsies were negative  Review of Systems    As per HPI, otherwise negative  Current Medications, Allergies, Past Medical History, Past Surgical History, Family History and Social History were reviewed in [employer] record.    Objective:   Physical Exam BP 118/68   Wt 181 lb (82.1 kg)   BMI 27.52 kg/m  Constitutional: Chronically ill-appearing male sitting in wheelchair in no acute distress  HEENT: Normocephalic and atraumatic.  No scleral icterus. Neck: Neck supple. Trachea  midline. Cardiovascular: Normal rate, regular rhythm and intact distal pulses. Pulmonary/chest: Effort normal and breath sounds distant. No wheezing, rales or rhonchi. Abdominal: Soft, nontender, nondistended. Bowel sounds active throughout.  Extremities: no clubbing, cyanosis, or edema Neurological: Alert and oriented to person place and time. Speech is somewhat slurred but intelligible; left-sided weakness Skin: Skin is warm and dry. Psychiatric: Normal mood and affect. Behavior is normal.     Assessment:  66 year old male with a history of adenomatous colon polyps, multifactorial anemia including iron deficiency anemia receiving IV iron through hematology, COPD, CVA with left-sided weakness, prostate cancer, CTD stage II, and hypertension who is here to discuss surveillance colonoscopy      Plan:     1. History of adenomatous colon polyps -- he is due surveillance colonoscopy at this time. We have discussed this today and he is not interested in proceeding with further colonoscopies for screening or surveillance. We discussed this together and he understands that he is at higher risk for colon polyps and colon cancer. I will certainly respect his opinion and we will cancel his recall for colonoscopy.  2. IDA -- multifactorial anemia being seen by Dr. Irene Limbo. He is receiving IV iron. Due to medical comorbidities aggressive workup not being pursued to further investigate anemia. He had previous EGD which also did not help explain iron deficiency anemia. Given his decision not to pursue invasive testing, I will defer anemia management to his hematologist. I expect he may need periodic IV iron.  He can return as needed 15 minutes spent with the patient today. Greater than 50% was spent in counseling and coordination of  care with the patient

## 2018-10-12 ENCOUNTER — Ambulatory Visit (HOSPITAL_COMMUNITY)
Admission: RE | Admit: 2018-10-12 | Discharge: 2018-10-12 | Disposition: A | Discharge status: Home / Self Care | Payer: Medicare Other | Source: Ambulatory Visit | Attending: Internal Medicine | Admitting: Internal Medicine

## 2018-10-12 ENCOUNTER — Other Ambulatory Visit (HOSPITAL_COMMUNITY): Payer: Self-pay

## 2018-10-12 DIAGNOSIS — D6489 Other specified anemias: Secondary | ICD-10-CM | POA: Insufficient documentation

## 2018-10-12 DIAGNOSIS — R1310 Dysphagia, unspecified: Secondary | ICD-10-CM

## 2018-10-12 DIAGNOSIS — J449 Chronic obstructive pulmonary disease, unspecified: Secondary | ICD-10-CM

## 2018-10-12 DIAGNOSIS — Z8673 Personal history of transient ischemic attack (TIA), and cerebral infarction without residual deficits: Secondary | ICD-10-CM

## 2018-10-12 DIAGNOSIS — Z86010 Personal history of colonic polyps: Secondary | ICD-10-CM

## 2018-10-12 DIAGNOSIS — E7800 Pure hypercholesterolemia, unspecified: Secondary | ICD-10-CM | POA: Insufficient documentation

## 2018-10-12 DIAGNOSIS — Z8546 Personal history of malignant neoplasm of prostate: Secondary | ICD-10-CM

## 2018-10-12 DIAGNOSIS — I10 Essential (primary) hypertension: Secondary | ICD-10-CM

## 2018-10-12 NOTE — Progress Notes (Addendum)
Objective Swallowing Evaluation: Type of Study: MBS-Modified Barium Swallow Study   Patient Details  Name: [name] MRN: [id number] Date of Birth: August 14, 1950  Today's Date: 10/12/2018 Time: SLP Start Time (ACUTE ONLY): 1150 -SLP Stop Time (ACUTE ONLY): 1220  SLP Time Calculation (min) (ACUTE ONLY): 30 min   Past Medical History:  Past Medical History:  Diagnosis Date  . Cerebral embolism with cerebral infarction ([place])    residual left sided weakness/notes 12/12/2013  . Chronic kidney disease (CKD), stage II (mild)    [name] 12/12/2013  . Colon polyps 10/20/2013   TUBULAR ADENOMA (4).  . COPD 05/18/2007  . Elevated PSA 05/25/2007  . High cholesterol   . Hypertension 05/18/2007  . Pneumonia    "couple times" (07/08/2016)  . Prostate cancer ([place])   . Protein-calorie malnutrition, severe ([place])    [name] 12/12/2013  . Salmonella 2017  . Seizures ([place]) 12/05/2013; 12/08/2013; 12/11/2013   new onset; recurrent/notes 12/12/2013  . Sepsis associated hypotension ([place]) 07/08/2016   [name] 07/08/2016  . Stroke ([place]) 10/14/2010, 11/2012   Right centrum semiovale  . Stroke [facility])    "I've had about 5" (07/08/2016)   Past Surgical History:  Past Surgical History:  Procedure Laterality Date  . COLONOSCOPY W/ BIOPSIES  2015   4 polyps, tubular adenomas, pan diverticulosis, one lipoma.   . ESOPHAGOGASTRODUODENOSCOPY N/A 06/06/2014   Procedure: ESOPHAGOGASTRODUODENOSCOPY (EGD);  Surgeon: Gatha Mayer, MD;  Location: [facility] END[facility]COPY;  Service: Endoscopy;  Laterality: N/A;  . FRACTURE SURGERY    . MULTIPLE EXTRACTIONS WITH ALVEOLOPLASTY N/A 10/23/2013   Procedure: Extraction of tooth #'s 1,2,3,4,5,6,7,11,12,13,14,15,16,20,21,22,23,24,27,28,29,32 with alveoloplasty and bilateral maxillary tuberosity reductions;  Surgeon: Lenn Cal, DDS;  Location: [facility];  Service: Oral Surgery;  Laterality: N/A;  . PEG TUBE PLACEMENT  12/2012   per IR  . TIBIA FRACTURE SURGERY Right    pin  . TOE AMPUTATION  Right 2000s   "middle toe"; Gangrene  . TONSILLECTOMY  1956   HPI: 68 y.o. male with medical history significant of stroke, prostate cancer with ? Bone mets, MGUS, chronic anemia, COPD, sepsis, and renal failure.  At baseline he resides at a SNF. Pt has been followed intermittently over the years by SLP services for acute dysphagia, generally resolves when medical condition improves.  MBS 2014 revealed likely anterior cervical osteophytes that have the potential to interfere with swallowing.    Subjective: Alert and cooperative; in wheelchair for testing    Assessment / Plan / Recommendation  CHL IP CLINICAL IMPRESSIONS 10/12/2018  Clinical Impression Pt presents with a primary structural dysphagia marked by bony protrusion of spine placing pressure on cervical esophagus impeding bolus transit resulting in mod-max residue. Oral and pharyngeal swallow function c/b strong laryngeal elevation, closure and inconsistent, but strong pharyngeal peristalsis. Mod vallecular/ pyriform sinus residue observed with solids, but max observed with intake of thin liquids resulting in trace- min aspiration after the swallow with no attempt from pt to clear. He required verbal cues in 100% of trials to throat clear and swallow multiple times in order to clear aspirate and pharyngeal residue. Chin tuck strategy to prevent aspiration and clear residue proved ineffective. Head turn and cough/throat clear followed by an effortful swallow most successful in preventing aspiration and reducing residue. Pill hesistated in pyriform sinuses and required liquid wash to transit into cervical esophagus. Due to limitations of fluoroscopy equipment with pt in wheel chair, esophageal sweep unable to be performed. Given structural component, recommend dys 3, thin liquid  diet with full supervision and SLP f/u in SNF for instruction on use of compensatory strategies outlined.  SLP Visit Diagnosis Dysphagia, unspecified (R13.10)  Attention  and concentration deficit following --  Frontal lobe and executive function deficit following --  Impact on safety and function Mild aspiration risk      CHL IP TREATMENT RECOMMENDATION 10/12/2018  Treatment Recommendations Other (Comment)     Prognosis 07/16/2014  Prognosis for Safe Diet Advancement (No Data)  Barriers to Reach Goals --  Barriers/Prognosis Comment --    CHL IP DIET RECOMMENDATION 10/12/2018  SLP Diet Recommendations Dysphagia 3 (Mech soft) solids;Thin liquid  Liquid Administration via Cup;Straw  Medication Administration Whole meds with liquid  Compensations Slow rate;Small sips/bites;Multiple dry swallows after each bite/sip;Clear throat after each swallow;Effortful swallow;Other (Comment)  Postural Changes Remain semi-upright after after feeds/meals (Comment);Seated upright at 90 degrees      CHL IP OTHER RECOMMENDATIONS 10/12/2018  Recommended Consults --  Oral Care Recommendations Oral care BID  Other Recommendations --      CHL IP FOLLOW UP RECOMMENDATIONS 07/10/2016  Follow up Recommendations None      CHL IP FREQUENCY AND DURATION 07/10/2016  Speech Therapy Frequency (ACUTE ONLY) min 1 x/week  Treatment Duration 1 week           CHL IP ORAL PHASE 10/12/2018  Oral Phase WFL  Oral - Pudding Teaspoon --  Oral - Pudding Cup --  Oral - Honey Teaspoon --  Oral - Honey Cup --  Oral - Nectar Teaspoon --  Oral - Nectar Cup --  Oral - Nectar Straw --  Oral - Thin Teaspoon --  Oral - Thin Cup --  Oral - Thin Straw --  Oral - Puree --  Oral - Mech Soft --  Oral - Regular --  Oral - Multi-Consistency --  Oral - Pill --  Oral Phase - Comment --    CHL IP PHARYNGEAL PHASE 10/12/2018  Pharyngeal Phase Impaired  Pharyngeal- Pudding Teaspoon --  Pharyngeal --  Pharyngeal- Pudding Cup --  Pharyngeal --  Pharyngeal- Honey Teaspoon --  Pharyngeal --  Pharyngeal- Honey Cup --  Pharyngeal --  Pharyngeal- Nectar Teaspoon --  Pharyngeal --  Pharyngeal- Nectar  Cup --  Pharyngeal --  Pharyngeal- Nectar Straw --  Pharyngeal --  Pharyngeal- Thin Teaspoon --  Pharyngeal --  Pharyngeal- Thin Cup Delayed swallow initiation-vallecula;Delayed swallow initiation-pyriform sinuses;Penetration/Apiration after swallow;Pharyngeal residue - valleculae;Pharyngeal residue - pyriform;Pharyngeal residue - posterior pharnyx;Pharyngeal residue - cp segment  Pharyngeal Material enters airway, CONTACTS cords and not ejected out;Material enters airway, passes BELOW cords without attempt by patient to eject out (silent aspiration)  Pharyngeal- Thin Straw Delayed swallow initiation-vallecula;Delayed swallow initiation-pyriform sinuses;Penetration/Apiration after swallow;Pharyngeal residue - valleculae;Pharyngeal residue - pyriform;Pharyngeal residue - posterior pharnyx;Pharyngeal residue - cp segment;Compensatory strategies attempted (with notebox)  Pharyngeal Material enters airway, CONTACTS cords and not ejected out;Material enters airway, passes BELOW cords without attempt by patient to eject out (silent aspiration)  Pharyngeal- Puree WFL;Pharyngeal residue - pyriform;Pharyngeal residue - valleculae  Pharyngeal --  Pharyngeal- Mechanical Soft --  Pharyngeal --  Pharyngeal- Regular WFL;Pharyngeal residue - pyriform;Pharyngeal residue - valleculae  Pharyngeal --  Pharyngeal- Multi-consistency --  Pharyngeal --  Pharyngeal- Pill Other (Comment)  Pharyngeal --  Pharyngeal Comment --     CHL IP CERVICAL ESOPHAGEAL PHASE 10/12/2018  Cervical Esophageal Phase Impaired  Pudding Teaspoon --  Pudding Cup --  Honey Teaspoon --  Honey Cup --  Nectar Teaspoon --  Nectar Cup --  Nectar Straw --  Thin Teaspoon --  Thin Cup --  Thin Straw --  Puree --  Mechanical Soft --  Regular --  Multi-consistency --  Pill --  Cervical Esophageal Comment (No Data)     Ellis Savage, Student SLP 10/12/2018, 2:04 PM

## 2018-10-23 ENCOUNTER — Encounter: Payer: Self-pay | Admitting: Internal Medicine

## 2019-03-08 ENCOUNTER — Other Ambulatory Visit: Payer: Self-pay

## 2019-03-08 ENCOUNTER — Non-Acute Institutional Stay: Payer: Medicare Other | Admitting: Adult Health Nurse Practitioner

## 2019-03-08 DIAGNOSIS — Z515 Encounter for palliative care: Secondary | ICD-10-CM

## 2019-03-08 NOTE — Progress Notes (Signed)
[facility] Consult Note Telephone: [phone]  Fax: [phone]  PATIENT NAME: [name] DOB: 1950/11/14 MRN: [id number]  PRIMARY CARE PROVIDER:   Wenda Low, MD  REFERRING PROVIDER: Clemens Catholic NP  RESPONSIBLE PARTY:  Self  Maryruth Bun, cousin  [phone] Delanna Notice, cousin [phone] Assunta Curtis, friend [phone]  Due to the COVID-19 crisis, this visit was done via telemedicine and it was initiated and consent by this patient and or family. Visit via Zoom facilitated by facility SW, Cecelia Byars.    RECOMMENDATIONS and PLAN:  1.  Prostate cancer with mets to bone.  Is being followed by oncology and urology and receives Lupron for the prostate cancer and injections to strengthen his bones.  Patient has no complaints today.  Denies pain.  Patient has left sided weakness due to past CVA but is able to do some activity with his left hand and arm.  Uses wheelchair to get around.  Requires one person assist with transfers and ADLs.  No reports of decline functionally and patient states no changes in his appetite.  2.  Goals of care.  Spoke with referring provider and she states that he waffles back and forth with what he wants in terms of advanced care planning.  Right now he is a full code and will state that he wants everything done and then wants provider to make that choice.  He is his own responsible party and no one can make that decision for him.  Will continue to have meetings with patient to discuss ACP.  Of note there is discrepancy as his face sheet states that he is a DNR but provider states that in his chart he has paperwork to be full code.  I spent 30 minutes providing this consultation,  from 1:00to 1:30. More than 50% of the time in this consultation was spent coordinating communication.   HISTORY OF PRESENT ILLNESS:  [name] is a 68 y.o. year old male with multiple medical problems  including bone cancer, prostate cancer, COPD, seizures, PVD, left sided weakness post CVA, CKD stage 4. Palliative Care was asked to help address goals of care.   CODE STATUS: Full Code  PPS: 50% HOSPICE ELIGIBILITY/DIAGNOSIS: TBD    PAST MEDICAL HISTORY:  Past Medical History:  Diagnosis Date  . Cerebral embolism with cerebral infarction ([place])    residual left sided weakness/notes 12/12/2013  . Chronic kidney disease (CKD), stage II (mild)    [name] 12/12/2013  . Colon polyps 10/20/2013   TUBULAR ADENOMA (4).  . COPD 05/18/2007  . Elevated PSA 05/25/2007  . High cholesterol   . Hypertension 05/18/2007  . Pneumonia    "couple times" (07/08/2016)  . Prostate cancer ([place])   . Protein-calorie malnutrition, severe ([place])    [name] 12/12/2013  . Salmonella 2017  . Seizures ([place]) 12/05/2013; 12/08/2013; 12/11/2013   new onset; recurrent/notes 12/12/2013  . Sepsis associated hypotension ([place]) 07/08/2016   [name] 07/08/2016  . Stroke ([place]) 10/14/2010, 11/2012   Right centrum semiovale  . Stroke [facility])    "I've had about 5" (07/08/2016)    SOCIAL HX:  Social History   Tobacco Use  . Smoking status: Current Some Day Smoker    Packs/day: 1.00    Years: 17.00    Pack years: 17.00    Types: Cigarettes  . Smokeless tobacco: Never Used  . Tobacco comment: 07/08/2016 "started smoking when I was 48"  Substance Use Topics  . Alcohol use:  No    Comment: 07/08/2016 "drank 1 bottle of wine/day before I went into nursing home"    ALLERGIES:  Allergies  Allergen Reactions  . Other Other (See Comments)    Epidural caused seizure  . Aspirin Hives  . Penicillins Hives     PERTINENT MEDICATIONS:  Outpatient Encounter Medications as of 03/08/2019  Medication Sig  . albuterol (PROVENTIL HFA;VENTOLIN HFA) 108 (90 BASE) MCG/ACT inhaler Inhale 2 puffs into the lungs every 6 (six) hours as needed for wheezing.  Marland Kitchen amLODipine (NORVASC) 10 MG tablet Take 1 tablet (10 mg total) by mouth daily.  Marland Kitchen  atorvastatin (LIPITOR) 10 MG tablet Take 10 mg by mouth daily at 6 PM.  . Calcium Carbonate-Vitamin D (CALTRATE 600+D) 600-400 MG-UNIT tablet Take 1 tablet by mouth 2 (two) times daily.  . chlorhexidine (PERIDEX) 0.12 % solution Use as directed 15 mLs in the mouth or throat daily.  . cloNIDine (CATAPRES) 0.1 MG tablet Take 0.1 mg by mouth 2 (two) times daily as needed (for SBP >160 or DBP >95).  Marland Kitchen clopidogrel (PLAVIX) 75 MG tablet Take 1 tablet (75 mg total) by mouth daily with breakfast.  . folic acid (FOLVITE) 1 MG tablet Take 1 tablet (1 mg total) by mouth daily.  Marland Kitchen levETIRAcetam (KEPPRA) 500 MG tablet Take 1,500 mg by mouth 2 (two) times daily.  . magnesium oxide (MAG-OX) 400 MG tablet Take 400 mg by mouth daily.  . Multiple Vitamin (MULTIVITAMIN WITH MINERALS) TABS tablet Take 1 tablet by mouth daily.  . Multiple Vitamins-Minerals (CERTAGEN PO) Take by mouth.  Marland Kitchen OVER THE COUNTER MEDICATION Take 1 Container by mouth 3 (three) times daily. Magic cup  . pantoprazole (PROTONIX) 40 MG tablet Take 1 tablet (40 mg total) by mouth daily.  . predniSONE (DELTASONE) 10 MG tablet Take 3 tabs daily for 2 days, then 2 tabs daily for 2 days, then 1 tab daily for 2 days, then stop.  Marland Kitchen senna (SENOKOT) 8.6 MG tablet Take 1 tablet by mouth daily.  Marland Kitchen tiotropium (SPIRIVA HANDIHALER) 18 MCG inhalation capsule Place 1 capsule (18 mcg total) into inhaler and inhale daily.   No facility-administered encounter medications on file as of 03/08/2019.       Isabell Bonafede Jenetta Downer, NP

## 2019-09-01 ENCOUNTER — Other Ambulatory Visit: Payer: Self-pay

## 2019-09-01 ENCOUNTER — Non-Acute Institutional Stay: Payer: Medicare Other | Admitting: Hospice

## 2019-09-01 DIAGNOSIS — Z515 Encounter for palliative care: Secondary | ICD-10-CM

## 2019-09-01 DIAGNOSIS — C61 Malignant neoplasm of prostate: Secondary | ICD-10-CM

## 2019-09-01 DIAGNOSIS — C7951 Secondary malignant neoplasm of bone: Secondary | ICD-10-CM

## 2019-09-01 NOTE — Progress Notes (Signed)
[facility] Consult Note Telephone: [phone]  Fax: [phone]  PATIENT NAME: [name] DOB: January 10, 1951 MRN: [id number]  PRIMARY CARE PROVIDER:   Wenda Low, MD  REFERRING PROVIDER: Clemens Catholic NP  RESPONSIBLE PARTY:  Self  Maryruth Bun, cousin  [phone] Delanna Notice, cousin [phone] Assunta Curtis, friend [phone]  .  RECOMMENDATIONS/PLAN:   Advance Care Planning/Goals of Care:  Visit consisted of building trust and discussions on Palliative Medicine as specialized medical care for people living with serious illness, aimed at facilitating better quality of life through symptoms relief, assisting with advance care plan and establishing goals of care. Patient said he wishes to be a full code. Validation provided. Full code status is reflected in facility chart. Goals of care include to maximize quality of life and symptom management. Symptom management: Chronic left side weakness s/p CVA. Patient no longer doing PT/OT at this time. He is not able to walk, self propels in wheelchair. He denied pain/discomfort. No recent COPD Exacerbation. He continues on his Spiriva as ordered. No reported seizures. No acute concerns from nursing staff. Encouraged ongoing nursing care. Follow up: Palliative care will continue to follow patient for goals of care clarification and symptom management. I spent 30  minutes providing this consultation.  More than 50% of the time in this consultation was spent on coordinating communication   HISTORY OF PRESENT ILLNESS:  [name] is a 70 y.o. year old male with multiple medical problems including bone cancer, prostate cancer, COPD, seizures, PVD, left sided weakness post CVA.  Palliative Care was asked to help address goals of care  CODE STATUS: Full  PPS: 40% HOSPICE ELIGIBILITY/DIAGNOSIS: TBD  PAST MEDICAL HISTORY:  Past Medical History:  Diagnosis Date  .  Cerebral embolism with cerebral infarction ([place])    residual left sided weakness/notes 12/12/2013  . Chronic kidney disease (CKD), stage II (mild)    [name] 12/12/2013  . Colon polyps 10/20/2013   TUBULAR ADENOMA (4).  . COPD 05/18/2007  . Elevated PSA 05/25/2007  . High cholesterol   . Hypertension 05/18/2007  . Pneumonia    "couple times" (07/08/2016)  . Prostate cancer ([place])   . Protein-calorie malnutrition, severe ([place])    [name] 12/12/2013  . Salmonella 2017  . Seizures ([place]) 12/05/2013; 12/08/2013; 12/11/2013   new onset; recurrent/notes 12/12/2013  . Sepsis associated hypotension ([place]) 07/08/2016   [name] 07/08/2016  . Stroke ([place]) 10/14/2010, 11/2012   Right centrum semiovale  . Stroke [facility])    "I've had about 5" (07/08/2016)    SOCIAL HX:  Social History   Tobacco Use  . Smoking status: Current Some Day Smoker    Packs/day: 1.00    Years: 17.00    Pack years: 17.00    Types: Cigarettes  . Smokeless tobacco: Never Used  . Tobacco comment: 07/08/2016 "started smoking when I was 48"  Substance Use Topics  . Alcohol use: No    Comment: 07/08/2016 "drank 1 bottle of wine/day before I went into nursing home"    ALLERGIES:  Allergies  Allergen Reactions  . Other Other (See Comments)    Epidural caused seizure  . Aspirin Hives  . Penicillins Hives     PERTINENT MEDICATIONS:  Outpatient Encounter Medications as of 09/01/2019  Medication Sig  . albuterol (PROVENTIL HFA;VENTOLIN HFA) 108 (90 BASE) MCG/ACT inhaler Inhale 2 puffs into the lungs every 6 (six) hours as needed for wheezing.  Marland Kitchen amLODipine (NORVASC) 10 MG tablet  Take 1 tablet (10 mg total) by mouth daily.  Marland Kitchen atorvastatin (LIPITOR) 10 MG tablet Take 10 mg by mouth daily at 6 PM.  . Calcium Carbonate-Vitamin D (CALTRATE 600+D) 600-400 MG-UNIT tablet Take 1 tablet by mouth 2 (two) times daily.  . chlorhexidine (PERIDEX) 0.12 % solution Use as directed 15 mLs in the mouth or throat daily.  . cloNIDine (CATAPRES) 0.1 MG  tablet Take 0.1 mg by mouth 2 (two) times daily as needed (for SBP >160 or DBP >95).  Marland Kitchen clopidogrel (PLAVIX) 75 MG tablet Take 1 tablet (75 mg total) by mouth daily with breakfast.  . folic acid (FOLVITE) 1 MG tablet Take 1 tablet (1 mg total) by mouth daily.  Marland Kitchen levETIRAcetam (KEPPRA) 500 MG tablet Take 1,500 mg by mouth 2 (two) times daily.  . magnesium oxide (MAG-OX) 400 MG tablet Take 400 mg by mouth daily.  . Multiple Vitamin (MULTIVITAMIN WITH MINERALS) TABS tablet Take 1 tablet by mouth daily.  . Multiple Vitamins-Minerals (CERTAGEN PO) Take by mouth.  Marland Kitchen OVER THE COUNTER MEDICATION Take 1 Container by mouth 3 (three) times daily. Magic cup  . pantoprazole (PROTONIX) 40 MG tablet Take 1 tablet (40 mg total) by mouth daily.  . predniSONE (DELTASONE) 10 MG tablet Take 3 tabs daily for 2 days, then 2 tabs daily for 2 days, then 1 tab daily for 2 days, then stop.  Marland Kitchen senna (SENOKOT) 8.6 MG tablet Take 1 tablet by mouth daily.  Marland Kitchen tiotropium (SPIRIVA HANDIHALER) 18 MCG inhalation capsule Place 1 capsule (18 mcg total) into inhaler and inhale daily.   No facility-administered encounter medications on file as of 09/01/2019.    PHYSICAL EXAM/ROS:   General: NAD Cardiovascular: regular rate and rhythm; denies chest pain Pulmonary: no coughing, no SOB, normal respiratory effort Extremities: no edema, no joint deformities Skin: no rashes on exposed skin Neurological: Weakness but otherwise nonfocal  Teodoro Spray, NP

## 2019-11-28 ENCOUNTER — Non-Acute Institutional Stay: Payer: Medicare Other | Admitting: Hospice

## 2019-11-28 ENCOUNTER — Other Ambulatory Visit: Payer: Self-pay

## 2019-11-28 DIAGNOSIS — R531 Weakness: Secondary | ICD-10-CM

## 2019-11-28 DIAGNOSIS — Z515 Encounter for palliative care: Secondary | ICD-10-CM

## 2019-11-28 NOTE — Progress Notes (Signed)
[facility] Consult Note Telephone: [phone]  Fax: [phone]  PATIENT NAME: [name] DOB: [birth year] MRN: [id number]  PRIMARY CARE PROVIDER:   Wenda Low, MD  REFERRING PROVIDER:  Wenda Low, MD [address] [place],  [place] [address] REFERRING PROVIDER: Clemens Catholic NP   RESPONSIBLE PARTY:  Self  Maryruth Bun, cousin  [place], cousin [phone] Assunta Curtis, friend [phone]   .   RECOMMENDATIONS/PLAN:    Advance Care Planning/Goals of Care:  Visit consisted of building trust and follow up on palliative care and goals of care clarification.   Patient is a Full code; he said he has no plans to reconsider his status at this time. Therapeutic listening provided. Full code status is reflected in facility chart. Goals of care include to maximize quality of life and symptom management. Symptom management: Patient is non ambulatory secondary to chronic left side weakness s/p CVA. He self propels in wheelchair and requires moderate assistance in ADLs. He denied pain/discomfort during visit and said overall he is doing well. No recent COPD Exacerbation. He continues on his Spiriva as ordered. No reported seizures. Nursing staff Barbaraann Cao reports patient is stable, with no concerns at this time. Encouraged ongoing nursing care. Follow up: Palliative care will continue to follow patient for goals of care clarification and symptom management. I spent 50 minutes providing this consultation; time includes chart review and documentation.  More than 50% of the time in this consultation was spent on coordinating communication    HISTORY OF PRESENT ILLNESS:  [name] is a 69 y.o. year old male with multiple medical problems including bone cancer, prostate cancer, COPD, seizures, PVD, left sided weakness post CVA.  Palliative Care was asked to help address goals of care   CODE STATUS:  Full   PPS: 40% HOSPICE ELIGIBILITY/DIAGNOSIS: TBD  PAST MEDICAL HISTORY:  Past Medical History:  Diagnosis Date  . Cerebral embolism with cerebral infarction ([place])    residual left sided weakness/notes 12/12/2013  . Chronic kidney disease (CKD), stage II (mild)    [name] 12/12/2013  . Colon polyps 10/20/2013   TUBULAR ADENOMA (4).  . COPD 05/18/2007  . Elevated PSA 05/25/2007  . High cholesterol   . Hypertension 05/18/2007  . Pneumonia    "couple times" (07/08/2016)  . Prostate cancer ([place])   . Protein-calorie malnutrition, severe ([place])    [name] 12/12/2013  . Salmonella 2017  . Seizures ([place]) 12/05/2013; 12/08/2013; 12/11/2013   new onset; recurrent/notes 12/12/2013  . Sepsis associated hypotension ([place]) 07/08/2016   [name] 07/08/2016  . Stroke ([place]) 10/14/2010, 11/2012   Right centrum semiovale  . Stroke [facility])    "I've had about 5" (07/08/2016)    SOCIAL HX:  Social History   Tobacco Use  . Smoking status: Current Some Day Smoker    Packs/day: 1.00    Years: 17.00    Pack years: 17.00    Types: Cigarettes  . Smokeless tobacco: Never Used  . Tobacco comment: 07/08/2016 "started smoking when I was 48"  Substance Use Topics  . Alcohol use: No    Comment: 07/08/2016 "drank 1 bottle of wine/day before I went into nursing home"    ALLERGIES:  Allergies  Allergen Reactions  . Other Other (See Comments)    Epidural caused seizure  . Aspirin Hives  . Penicillins Hives     PERTINENT MEDICATIONS:  Outpatient Encounter Medications as of 11/28/2019  Medication Sig  . albuterol (  PROVENTIL HFA;VENTOLIN HFA) 108 (90 BASE) MCG/ACT inhaler Inhale 2 puffs into the lungs every 6 (six) hours as needed for wheezing.  Marland Kitchen amLODipine (NORVASC) 10 MG tablet Take 1 tablet (10 mg total) by mouth daily.  Marland Kitchen atorvastatin (LIPITOR) 10 MG tablet Take 10 mg by mouth daily at 6 PM.  . Calcium Carbonate-Vitamin D (CALTRATE 600+D) 600-400 MG-UNIT tablet Take 1 tablet by mouth 2 (two) times daily.  .  chlorhexidine (PERIDEX) 0.12 % solution Use as directed 15 mLs in the mouth or throat daily.  . cloNIDine (CATAPRES) 0.1 MG tablet Take 0.1 mg by mouth 2 (two) times daily as needed (for SBP >160 or DBP >95).  Marland Kitchen clopidogrel (PLAVIX) 75 MG tablet Take 1 tablet (75 mg total) by mouth daily with breakfast.  . folic acid (FOLVITE) 1 MG tablet Take 1 tablet (1 mg total) by mouth daily.  Marland Kitchen levETIRAcetam (KEPPRA) 500 MG tablet Take 1,500 mg by mouth 2 (two) times daily.  . magnesium oxide (MAG-OX) 400 MG tablet Take 400 mg by mouth daily.  . Multiple Vitamin (MULTIVITAMIN WITH MINERALS) TABS tablet Take 1 tablet by mouth daily.  . Multiple Vitamins-Minerals (CERTAGEN PO) Take by mouth.  Marland Kitchen OVER THE COUNTER MEDICATION Take 1 Container by mouth 3 (three) times daily. Magic cup  . pantoprazole (PROTONIX) 40 MG tablet Take 1 tablet (40 mg total) by mouth daily.  . predniSONE (DELTASONE) 10 MG tablet Take 3 tabs daily for 2 days, then 2 tabs daily for 2 days, then 1 tab daily for 2 days, then stop.  Marland Kitchen senna (SENOKOT) 8.6 MG tablet Take 1 tablet by mouth daily.  Marland Kitchen tiotropium (SPIRIVA HANDIHALER) 18 MCG inhalation capsule Place 1 capsule (18 mcg total) into inhaler and inhale daily.   No facility-administered encounter medications on file as of 11/28/2019.    PHYSICAL EXAM/ROS:   General: NAD, cooperative Cardiovascular: regular rate and rhythm; denies chest pain Pulmonary: no coughing, no SOB, normal respiratory effort Extremities: no edema, no joint deformities Skin: no rashes on exposed skin Neurological: Weakness but otherwise nonfocal  Teodoro Spray, NP

## 2020-04-30 ENCOUNTER — Other Ambulatory Visit: Payer: Self-pay

## 2020-04-30 ENCOUNTER — Non-Acute Institutional Stay: Payer: Medicare Other | Admitting: Hospice

## 2020-04-30 DIAGNOSIS — J42 Unspecified chronic bronchitis: Secondary | ICD-10-CM

## 2020-04-30 DIAGNOSIS — Z515 Encounter for palliative care: Secondary | ICD-10-CM

## 2020-04-30 NOTE — Progress Notes (Signed)
[facility] Consult Note Telephone: [phone]  Fax: [phone]  PATIENT NAME: [name] DOB: 1951-03-30 MRN: [id number]  PRIMARY CARE PROVIDER:   Wenda Low, MD Wenda Low, MD [address] [place],  [place] [address]  REFERRING PROVIDER:Brittany Sabra Heck NP  RESPONSIBLE PARTY:Self [name], cousin [phone] Delanna Notice, cousin [phone] Assunta Curtis, friend [phone]  .  RECOMMENDATIONS/PLAN:  Advance Care Planning/Goals of Care: Visit consisted of building trust and follow up on palliative care.  Advance care planning discussions includes the value and importance of advance care planning, exploration of goals of care in the event of a sudden illness/injury all progresssion of ongoing disease.  It also includes identification of the healthcare agent, exploration of personal/cultural/spiritual beliefs that might influence advance care planning/medical decisions, and review and updates or completion of advance directive document.  CODE STATUS:  CODE STATUS reviewed today.  Patient is a Full code; he said he has no plans to reconsider his status at this time. Therapeutic listening provided. Full code status is reflected in facility chart.  GOALS OF CARE: Goals of care include to maximize quality of life and symptom management. Visit consisted of discussion dealing with the complex and emotionally intense issues of symptom management and palliative care in the setting of serious and potentially life-threatening illness. Palliative care team will continue to support patient, patient's family, and medical team.  Follow [id number] care will continue to follow patient for goals of care clarification and symptom management. Follow up in 3 months.  Symptom management: No recent reported COPD Exacerbation; he has his breathing treatments and is compliant with those.  Patient denied  pain/discomfort, no difficulty breathing.  Some wet cough noted; he said he does not cough a lot but sometimes he coughs out white phlegm.  No fever no chills, no malaise.  NP  alerted nursing staff to monitor closely; to give patient his prn cough medication if needed.  Patient is non ambulatory secondary to chronic left side weakness s/p CVA. He self propels in wheelchair and requires moderate assistance in ADLs.  He said overall he is doing well.    No reported seizures. Nursing staff  reports patient is stable, with no concerns at this time. Encouraged ongoing nursing care. Palliative will continue to monitor for symptom management/decline and make recommendations as needed.  I spent46 minutes providing this consultation; time includes chart review and documentation. More than 50% of the time in this consultation was spent on coordinating communication   HISTORY OF PRESENT ILLNESS:Terry P Wileyis a 69 y.o.year oldmalewith multiple medical problems including bone cancer, prostate cancer, COPD, seizures, PVD, leftsided weakness post CVA.Palliative Care was asked to help address goals of care  CODE STATUS:Full  PPS:40%  HOSPICE ELIGIBILITY/DIAGNOSIS: TBD  PAST MEDICAL HISTORY:  Past Medical History:  Diagnosis Date  . Cerebral embolism with cerebral infarction ([place])    residual left sided weakness/notes 12/12/2013  . Chronic kidney disease (CKD), stage II (mild)    [name] 12/12/2013  . Colon polyps 10/20/2013   TUBULAR ADENOMA (4).  . COPD 05/18/2007  . Elevated PSA 05/25/2007  . High cholesterol   . Hypertension 05/18/2007  . Pneumonia    "couple times" (07/08/2016)  . Prostate cancer ([place])   . Protein-calorie malnutrition, severe ([place])    [name] 12/12/2013  . Salmonella 2017  . Seizures ([place]) 12/05/2013; 12/08/2013; 12/11/2013   new onset; recurrent/notes 12/12/2013  . Sepsis associated hypotension ([place]) 07/08/2016   [name] 07/08/2016  .  Stroke ([place]) 10/14/2010, 11/2012    Right centrum semiovale  . Stroke [facility])    "I've had about 5" (07/08/2016)    SOCIAL HX:  Social History   Tobacco Use  . Smoking status: Current Some Day Smoker    Packs/day: 1.00    Years: 17.00    Pack years: 17.00    Types: Cigarettes  . Smokeless tobacco: Never Used  . Tobacco comment: 07/08/2016 "started smoking when I was 48"  Substance Use Topics  . Alcohol use: No    Comment: 07/08/2016 "drank 1 bottle of wine/day before I went into nursing home"    ALLERGIES:  Allergies  Allergen Reactions  . Other Other (See Comments)    Epidural caused seizure  . Aspirin Hives  . Penicillins Hives     PERTINENT MEDICATIONS:  Outpatient Encounter Medications as of 04/30/2020  Medication Sig  . albuterol (PROVENTIL HFA;VENTOLIN HFA) 108 (90 BASE) MCG/ACT inhaler Inhale 2 puffs into the lungs every 6 (six) hours as needed for wheezing.  Marland Kitchen amLODipine (NORVASC) 10 MG tablet Take 1 tablet (10 mg total) by mouth daily.  Marland Kitchen atorvastatin (LIPITOR) 10 MG tablet Take 10 mg by mouth daily at 6 PM.  . Calcium Carbonate-Vitamin D (CALTRATE 600+D) 600-400 MG-UNIT tablet Take 1 tablet by mouth 2 (two) times daily.  . chlorhexidine (PERIDEX) 0.12 % solution Use as directed 15 mLs in the mouth or throat daily.  . cloNIDine (CATAPRES) 0.1 MG tablet Take 0.1 mg by mouth 2 (two) times daily as needed (for SBP >160 or DBP >95).  Marland Kitchen clopidogrel (PLAVIX) 75 MG tablet Take 1 tablet (75 mg total) by mouth daily with breakfast.  . folic acid (FOLVITE) 1 MG tablet Take 1 tablet (1 mg total) by mouth daily.  Marland Kitchen levETIRAcetam (KEPPRA) 500 MG tablet Take 1,500 mg by mouth 2 (two) times daily.  . magnesium oxide (MAG-OX) 400 MG tablet Take 400 mg by mouth daily.  . Multiple Vitamin (MULTIVITAMIN WITH MINERALS) TABS tablet Take 1 tablet by mouth daily.  . Multiple Vitamins-Minerals (CERTAGEN PO) Take by mouth.  Marland Kitchen OVER THE COUNTER MEDICATION Take 1 Container by mouth 3 (three) times daily. Magic cup  . pantoprazole  (PROTONIX) 40 MG tablet Take 1 tablet (40 mg total) by mouth daily.  . predniSONE (DELTASONE) 10 MG tablet Take 3 tabs daily for 2 days, then 2 tabs daily for 2 days, then 1 tab daily for 2 days, then stop.  Marland Kitchen senna (SENOKOT) 8.6 MG tablet Take 1 tablet by mouth daily.  Marland Kitchen tiotropium (SPIRIVA HANDIHALER) 18 MCG inhalation capsule Place 1 capsule (18 mcg total) into inhaler and inhale daily.   No facility-administered encounter medications on file as of 04/30/2020.    PHYSICAL EXAM/ROS  General: NAD, cooperative Cardiovascular: regular rate and rhythm; denies chest pain Pulmonary: no SOB, normal respiratory effort; rhonchorous breath sounds in bil upper lobes which cleared with coughing Abdomen: Soft, active bowel sounds; no guarding, no report of constipation Extremities: no edema. Skin: no rasheson exposed skin Neurological: Weakness but otherwise nonfocal  Teodoro Spray, NP

## 2020-06-20 ENCOUNTER — Other Ambulatory Visit (HOSPITAL_COMMUNITY): Payer: Self-pay

## 2020-06-20 DIAGNOSIS — R1310 Dysphagia, unspecified: Secondary | ICD-10-CM

## 2020-06-25 ENCOUNTER — Ambulatory Visit (HOSPITAL_COMMUNITY)
Admission: RE | Admit: 2020-06-25 | Discharge: 2020-06-25 | Disposition: A | Discharge status: Home / Self Care | Payer: Medicare Other | Source: Ambulatory Visit | Attending: Internal Medicine | Admitting: Internal Medicine

## 2020-06-25 ENCOUNTER — Other Ambulatory Visit: Payer: Self-pay

## 2020-06-25 DIAGNOSIS — R1310 Dysphagia, unspecified: Secondary | ICD-10-CM | POA: Diagnosis not present

## 2020-06-25 NOTE — Progress Notes (Signed)
Modified Barium Swallow Progress Note  Patient Details  Name: [name] MRN: [id number] Date of Birth: 12-24-1950  Today's Date: 06/25/2020  Modified Barium Swallow completed.  Full report located under Chart Review in the Imaging Section.  Brief recommendations include the following:  Clinical Impression  Pt was seen for MBS which revealed a primarily pharyngeal dysphagia. As compared to the previous MBS on 10/12/2018, the pt's swallow function was more inconsistent as he demonstrated a greater number of swallows with reduced hyolaryngeal excursion and pharyngeal constriction. This led to consistent penetration of both thin and nectar thick liquid, with a min amount of material being silently aspirated. Pharyngeal residue was observed in the valleculae and pyriforms, likely due to bony protrusions of the spine placing pressure on cervical esophagus and impeding bolus transit. Chin tuck and head turn strategies were utilized but unsuccessful. Recommend dys 3 (mech soft) diet and thin liquids given equal risk of aspiration between thin and thick textures. Encourage frequent cough/throat clear. Pt would benefit from continued SLP treatment at the SNF to address pharyngeal strengthening and swallow safety.    Swallow Evaluation Recommendations       SLP Diet Recommendations: Dysphagia 3 (Mech soft) solids;Thin liquid   Liquid Administration via: Cup;Straw   Medication Administration: Whole meds with liquid   Supervision: Staff to assist with self feeding;Intermittent supervision to cue for compensatory strategies   Compensations: Clear throat intermittently   Postural Changes: Seated upright at 90 degrees   Oral Care Recommendations: Oral care BID        Greggory Keen 06/25/2020,2:03 PM

## 2020-07-23 ENCOUNTER — Non-Acute Institutional Stay: Payer: Medicare Other | Admitting: Hospice

## 2020-07-23 ENCOUNTER — Other Ambulatory Visit: Payer: Self-pay

## 2020-07-23 DIAGNOSIS — Z515 Encounter for palliative care: Secondary | ICD-10-CM

## 2020-07-23 DIAGNOSIS — J42 Unspecified chronic bronchitis: Secondary | ICD-10-CM

## 2020-07-23 NOTE — Progress Notes (Signed)
[facility] Consult Note Telephone: [phone]  Fax: [phone]  PATIENT NAME: [name] DOB: [birth year] MRN: [id number]  PRIMARY CARE PROVIDER:   Wenda Low, MD Wenda Low, MD [place] [address] [place],  [place] [address]  REFERRING PROVIDER:Brittany Sabra Heck NP  RESPONSIBLE PARTY:Self Maryruth Bun, cousin [phone] Delanna Notice, cousin [phone] Assunta Curtis, friend [phone]  .  RECOMMENDATIONS/PLAN:  Advance Care Planning/Goals of Care: Visit consisted of building trust and discussions on Palliative Medicine as specialized medical care for people living with serious illness, aimed at facilitating better quality of life through symptoms relief, assisting with advance care plan and establishing goals of care.  Visit consisted of counseling and education dealing with the complex and emotionally intense issues of symptom management and palliative care in the setting of serious and potentially life-threatening illness. Palliative care team will continue to support patient, patient's family, and medical team.  CODE STATUS: CODE STATUS reviewed today.  Patient affirmed he is a full code.  He reiterated that he wants all measures to keep him alive.  Full code status is reflected in facility chart.  GOALS OF CARE: Goals of care include to maximize quality of life and symptom management  Follow GY:[id number] care will continue to follow patient for goals of care clarification and symptom management. Follow up in 3 months/as needed.  Symptom management:Patient in no medical acuity.  No recent reported COPD Exacerbation;  continue breathing treatments as ordered. Plan: Patient denied pain/discomfort.  Patient is non ambulatory secondary to chronic left side weakness s/p CVA.Heself propels in wheelchair and requires moderate assistance in ADLs.  He said overall he is doing well.   No  reported seizures.Nursing staff  reports patient is stable, with no concernsat this time.  [facility] NP affirms patient stable at this time.  Recent reduction of lisinopril dose from 40 mg to 20 mg is effective-BP under control.  Encouraged ongoing nursing care. Palliative will continue to monitor for symptom management/decline and make recommendations as needed.  I spent39minutes providing this consultation; time includes chart review and documentation. More than 50% of the time in this consultation was spent on coordinating communication   HISTORY OF PRESENT ILLNESS:Terry P Wileyis a 69 y.o.year oldmalewith multiple medical problems including bone cancer, prostate cancer, COPD, seizures, PVD, leftsided weakness post CVA.Palliative Care was asked to help address goals of care  CODE STATUS:Full  PPS:40%  HOSPICE ELIGIBILITY/DIAGNOSIS: TBD  PAST MEDICAL HISTORY:  Past Medical History:  Diagnosis Date  . Cerebral embolism with cerebral infarction ([place])    residual left sided weakness/notes 12/12/2013  . Chronic kidney disease (CKD), stage II (mild)    [name] 12/12/2013  . Colon polyps 10/20/2013   TUBULAR ADENOMA (4).  . COPD 05/18/2007  . Elevated PSA 05/25/2007  . High cholesterol   . Hypertension 05/18/2007  . Pneumonia    "couple times" (07/08/2016)  . Prostate cancer ([place])   . Protein-calorie malnutrition, severe ([place])    [name] 12/12/2013  . Salmonella 2017  . Seizures ([place]) 12/05/2013; 12/08/2013; 12/11/2013   new onset; recurrent/notes 12/12/2013  . Sepsis associated hypotension ([place]) 07/08/2016   [name] 07/08/2016  . Stroke ([place]) 10/14/2010, 11/2012   Right centrum semiovale  . Stroke [facility])    "I've had about 5" (07/08/2016)    SOCIAL HX:  Social History   Tobacco Use  . Smoking status: Current Some Day Smoker    Packs/day: 1.00    Years: 17.00    Pack  years: 17.00    Types: Cigarettes  . Smokeless tobacco: Never Used  . Tobacco comment: 07/08/2016 "started  smoking when I was 48"  Substance Use Topics  . Alcohol use: No    Comment: 07/08/2016 "drank 1 bottle of wine/day before I went into nursing home"    ALLERGIES:  Allergies  Allergen Reactions  . Other Other (See Comments)    Epidural caused seizure  . Aspirin Hives  . Penicillins Hives     PERTINENT MEDICATIONS:  Outpatient Encounter Medications as of 07/23/2020  Medication Sig  . albuterol (PROVENTIL HFA;VENTOLIN HFA) 108 (90 BASE) MCG/ACT inhaler Inhale 2 puffs into the lungs every 6 (six) hours as needed for wheezing.  Marland Kitchen amLODipine (NORVASC) 10 MG tablet Take 1 tablet (10 mg total) by mouth daily.  Marland Kitchen atorvastatin (LIPITOR) 10 MG tablet Take 10 mg by mouth daily at 6 PM.  . Calcium Carbonate-Vitamin D (CALTRATE 600+D) 600-400 MG-UNIT tablet Take 1 tablet by mouth 2 (two) times daily.  . chlorhexidine (PERIDEX) 0.12 % solution Use as directed 15 mLs in the mouth or throat daily.  . cloNIDine (CATAPRES) 0.1 MG tablet Take 0.1 mg by mouth 2 (two) times daily as needed (for SBP >160 or DBP >95).  Marland Kitchen clopidogrel (PLAVIX) 75 MG tablet Take 1 tablet (75 mg total) by mouth daily with breakfast.  . folic acid (FOLVITE) 1 MG tablet Take 1 tablet (1 mg total) by mouth daily.  Marland Kitchen levETIRAcetam (KEPPRA) 500 MG tablet Take 1,500 mg by mouth 2 (two) times daily.  . magnesium oxide (MAG-OX) 400 MG tablet Take 400 mg by mouth daily.  . Multiple Vitamin (MULTIVITAMIN WITH MINERALS) TABS tablet Take 1 tablet by mouth daily.  . Multiple Vitamins-Minerals (CERTAGEN PO) Take by mouth.  Marland Kitchen OVER THE COUNTER MEDICATION Take 1 Container by mouth 3 (three) times daily. Magic cup  . pantoprazole (PROTONIX) 40 MG tablet Take 1 tablet (40 mg total) by mouth daily.  . predniSONE (DELTASONE) 10 MG tablet Take 3 tabs daily for 2 days, then 2 tabs daily for 2 days, then 1 tab daily for 2 days, then stop.  Marland Kitchen senna (SENOKOT) 8.6 MG tablet Take 1 tablet by mouth daily.  Marland Kitchen tiotropium (SPIRIVA HANDIHALER) 18 MCG  inhalation capsule Place 1 capsule (18 mcg total) into inhaler and inhale daily.   No facility-administered encounter medications on file as of 07/23/2020.    PHYSICAL EXAM/ROS  General: NAD, cooperative Cardiovascular: regular rate and rhythm; denies chest pain/ Pulmonary: clear ant post fields; no shortness of breath Abdomen: soft, nontender, + bowel sounds GU: no suprapubic tenderness Extremities: no edema, no joint deformities Skin: no rashes to visible skin Neurological: Weakness but otherwise nonfocal  Note:  Portions of this note were generated with Dragon dictation software. Dictation errors may occur despite attempts at proofreading.  Teodoro Spray, NP

## 2021-02-04 ENCOUNTER — Non-Acute Institutional Stay: Payer: Medicare Other | Admitting: Hospice

## 2021-02-04 ENCOUNTER — Other Ambulatory Visit: Payer: Self-pay

## 2021-02-04 DIAGNOSIS — J440 Chronic obstructive pulmonary disease with acute lower respiratory infection: Secondary | ICD-10-CM

## 2021-02-04 DIAGNOSIS — Z515 Encounter for palliative care: Secondary | ICD-10-CM

## 2021-02-04 DIAGNOSIS — R569 Unspecified convulsions: Secondary | ICD-10-CM

## 2021-02-04 NOTE — Progress Notes (Signed)
[facility] Consult Note Telephone: [phone]  Fax: [phone]  PATIENT NAME: [name] DOB: [birth year] MRN: [id number]  PRIMARY CARE PROVIDER:   Wenda Low, MD Wenda Low, MD [place] [address] [place],  [place] [address]  REFERRING PROVIDER: Wenda Low, MD Wenda Low, MD [place] [address] [place],  [place] [address]  RESPONSIBLE PARTY:  Self  Maryruth Bun, cousin  [phone] Delanna Notice, cousin [phone] Assunta Curtis, friend [phone] Contact Information     Name Relation Home Work [place] Relative   [phone]   Graves,Crystal Relative   [phone]   Georgette Shell [phone]  [phone]   Franz Dell   [phone]       Visit is to build trust and highlight Palliative Medicine as specialized medical care for people living with serious illness, aimed at facilitating better quality of life through symptoms relief, assisting with advance care planning and complex medical decision making. This is a follow up visit.  RECOMMENDATIONS/PLAN:   Advance Care Planning/Code Status: Patient is a Full code and does not want to change it at this time  Goals of Care: Goals of care include to maximize quality of life and symptom management.  Visit consisted of counseling and education dealing with the complex and emotionally intense issues of symptom management and palliative care in the setting of serious and potentially life-threatening illness. Palliative care team will continue to support patient, patient's family, and medical team.  Symptom management/Plan:  COPD: No recent exacerbation, continue breathing treatments as ordered. Education provided on smoking cessation. He said he does not plan to stop at this time.  Seizure: No reported seizure since last visit. Continue Keppra as ordered.  HTN: Recent reduction of lisinopril dose from 40  mg to 20 mg is effective-BP under control.  Encouraged ongoing nursing care.  Follow up: Palliative care will continue to follow for complex medical decision making, advance care planning, and clarification of goals. Return 6 weeks or prn. Encouraged to call provider sooner with any concerns.  CHIEF COMPLAINT: Palliative follow up  HISTORY OF PRESENT ILLNESS:  [name] a 70 y.o. male with multiple medical problems including COPD. History of bone cancer, prostate cancer, COPD, seizures, PVD, left sided weakness post CVA. Patient is non ambulatory secondary to chronic left side weakness s/p CVA. He self propels in wheelchair and requires moderate assistance in ADLs.  He said overall he is doing well; he denies pain/discomfort. History obtained from review of EMR, discussion with primary team, family and/or patient. Records reviewed and summarized above. All 10 point systems reviewed and are negative except as documented in history of present illness above  Review and summarization of Epic records shows history from other than patient.   Palliative Care was asked to follow this patient o help address complex decision making in the context of advance care planning and goals of care clarification.   PHYSICAL EXAM  General: In no acute distress, appropriately dressed Cardiovascular: regular rate and rhythm; no edema in BLE Pulmonary: no cough, no increased work of breathing, normal respiratory effort Abdomen: soft, non tender, no guarding, positive bowel sounds in all quadrants GU:  no suprapubic tenderness Eyes: Normal lids, no discharge ENMT: Moist mucous membranes Musculoskeletal:  weakness, self propels wheelchair Skin: no rash to visible skin, warm without cyanosis,  Psych: non-anxious affect Neurological: Weakness but otherwise non focal Heme/lymph/immuno: no bruises, no bleeding  PERTINENT MEDICATIONS:  Outpatient Encounter Medications  as of 02/04/2021  Medication Sig   albuterol  (PROVENTIL HFA;VENTOLIN HFA) 108 (90 BASE) MCG/ACT inhaler Inhale 2 puffs into the lungs every 6 (six) hours as needed for wheezing.   amLODipine (NORVASC) 10 MG tablet Take 1 tablet (10 mg total) by mouth daily.   atorvastatin (LIPITOR) 10 MG tablet Take 10 mg by mouth daily at 6 PM.   Calcium Carbonate-Vitamin D (CALTRATE 600+D) 600-400 MG-UNIT tablet Take 1 tablet by mouth 2 (two) times daily.   chlorhexidine (PERIDEX) 0.12 % solution Use as directed 15 mLs in the mouth or throat daily.   cloNIDine (CATAPRES) 0.1 MG tablet Take 0.1 mg by mouth 2 (two) times daily as needed (for SBP >160 or DBP >95).   clopidogrel (PLAVIX) 75 MG tablet Take 1 tablet (75 mg total) by mouth daily with breakfast.   folic acid (FOLVITE) 1 MG tablet Take 1 tablet (1 mg total) by mouth daily.   levETIRAcetam (KEPPRA) 500 MG tablet Take 1,500 mg by mouth 2 (two) times daily.   magnesium oxide (MAG-OX) 400 MG tablet Take 400 mg by mouth daily.   Multiple Vitamin (MULTIVITAMIN WITH MINERALS) TABS tablet Take 1 tablet by mouth daily.   Multiple Vitamins-Minerals (CERTAGEN PO) Take by mouth.   OVER THE COUNTER MEDICATION Take 1 Container by mouth 3 (three) times daily. Magic cup   pantoprazole (PROTONIX) 40 MG tablet Take 1 tablet (40 mg total) by mouth daily.   predniSONE (DELTASONE) 10 MG tablet Take 3 tabs daily for 2 days, then 2 tabs daily for 2 days, then 1 tab daily for 2 days, then stop.   senna (SENOKOT) 8.6 MG tablet Take 1 tablet by mouth daily.   tiotropium (SPIRIVA HANDIHALER) 18 MCG inhalation capsule Place 1 capsule (18 mcg total) into inhaler and inhale daily.   No facility-administered encounter medications on file as of 02/04/2021.    HOSPICE ELIGIBILITY/DIAGNOSIS: TBD  PAST MEDICAL HISTORY:  Past Medical History:  Diagnosis Date   Cerebral embolism with cerebral infarction ([place])    residual left sided weakness/notes 12/12/2013   Chronic kidney disease (CKD), stage II (mild)    [name] 12/12/2013    Colon polyps 10/20/2013   TUBULAR ADENOMA (4).   COPD 05/18/2007   Elevated PSA 05/25/2007   High cholesterol    Hypertension 05/18/2007   Pneumonia    "couple times" (07/08/2016)   Prostate cancer ([place])    Protein-calorie malnutrition, severe ([place])    [name] 12/12/2013   Salmonella 2017   Seizures ([place]) 12/05/2013; 12/08/2013; 12/11/2013   new onset; recurrent/notes 12/12/2013   Sepsis associated hypotension ([place]) 07/08/2016   [name] 07/08/2016   Stroke ([place]) 10/14/2010, 11/2012   Right centrum semiovale   Stroke ([place])    "I've had about 5" (07/08/2016)     ALLERGIES:  Allergies  Allergen Reactions   Other Other (See Comments)    Epidural caused seizure   Aspirin Hives   Penicillins Hives      I spent 50 minutes providing this consultation; this includes time spent with patient/family, chart review and documentation. More than 50% of the time in this consultation was spent on counseling and coordinating communication   Thank you for the opportunity to participate in the care of [name] Please call our office at [phone] if we can be of additional assistance.  Note: Portions of this note were generated with Lobbyist. Dictation errors may occur despite best attempts at proofreading.  Teodoro Spray, NP

## 2021-05-20 ENCOUNTER — Other Ambulatory Visit: Payer: Self-pay

## 2021-05-20 ENCOUNTER — Non-Acute Institutional Stay: Payer: Medicare Other | Admitting: Hospice

## 2021-05-20 DIAGNOSIS — Z515 Encounter for palliative care: Secondary | ICD-10-CM

## 2021-05-20 DIAGNOSIS — J440 Chronic obstructive pulmonary disease with acute lower respiratory infection: Secondary | ICD-10-CM

## 2021-05-20 DIAGNOSIS — R569 Unspecified convulsions: Secondary | ICD-10-CM

## 2021-05-20 DIAGNOSIS — J209 Acute bronchitis, unspecified: Secondary | ICD-10-CM

## 2021-05-20 DIAGNOSIS — I10 Essential (primary) hypertension: Secondary | ICD-10-CM

## 2021-05-20 NOTE — Progress Notes (Signed)
[facility] Consult Note Telephone: [phone]  Fax: [phone]  PATIENT NAME: [name] DOB: 1951/05/23 MRN: [id number]  PRIMARY CARE PROVIDER:   Wenda Low, MD Wenda Low, MD [place] [address] [place],  [place] [address]  REFERRING PROVIDER: Wenda Low, MD Wenda Low, MD [place] [address] [place],   [address]  RESPONSIBLE PARTY:   Self  Maryruth Bun, cousin  [phone] Delanna Notice, cousin [phone] Assunta Curtis, friend [phone] Contact Information     Name Relation Home Work [place] Relative   [phone]   Graves,Crystal Relative   [phone]   Georgette Shell [phone]  [phone]   Franz Dell   [phone]       Visit is to build trust and highlight Palliative Medicine as specialized medical care for people living with serious illness, aimed at facilitating better quality of life through symptoms relief, assisting with advance care planning and complex medical decision making. This is a follow up visit.  RECOMMENDATIONS/PLAN:   Advance Care Planning/Code Status: Patient affirmed he is a full code  Goals of Care: Goals of care include to maximize quality of life and symptom management.  Visit consisted of counseling and education dealing with the complex and emotionally intense issues of symptom management and palliative care in the setting of serious and potentially life-threatening illness. Palliative care team will continue to support patient, patient's family, and medical team.  Symptom management/Plan:  COPD: No exacerbation since last visit, no hospitalization.  Patient continues to smoke; smoking cessation discussed; patient reiterated he does not plan to stop at this time.  Continue with breathing treatments as ordered Seizure: Managed with Keppra. Hypertension: Managed with lisinopril metoprolol and amlodipine.   Effective.  Follow up: Palliative care will continue to follow for complex medical decision making, advance care planning, and clarification of goals. Return 6 weeks or prn. Encouraged to call provider sooner with any concerns.  CHIEF COMPLAINT: Palliative follow up  HISTORY OF PRESENT ILLNESS:  [name] a 70 y.o. male with multiple medical problems including COPD, hypertension, seizure disorder.  History of bone cancer, prostate cancer, PVD, left sided weakness post CVA, self propels with his wheelchair.  Patient denies pain/discomfort, no respiratory distress.  History obtained from review of EMR, discussion with primary team, family and/or patient. Records reviewed and summarized above. All 10 point systems reviewed and are negative except as documented in history of present illness above  Review and summarization of Epic records shows history from other than patient.   Palliative Care was asked to follow this patient o help address complex decision making in the context of advance care planning and goals of care clarification.   PHYSICAL EXAM  General: In no acute distress, appropriately dressed Cardiovascular: regular rate and rhythm; no edema in BLE Pulmonary: no cough, no increased work of breathing, normal respiratory effort Abdomen: soft, non tender, no guarding, positive bowel sounds in all quadrants GU:  no suprapubic tenderness Eyes: Normal lids, no discharge ENMT: Moist mucous membranes Musculoskeletal:  weakness, self propels wheelchair Skin: no rash to visible skin, warm without cyanosis,  Psych: non-anxious affect Neurological: Weakness but otherwise non focal Heme/lymph/immuno: no bruises, no bleeding  PERTINENT MEDICATIONS:  Outpatient Encounter Medications as of 05/20/2021  Medication Sig   albuterol (PROVENTIL HFA;VENTOLIN HFA) 108 (90 BASE) MCG/ACT inhaler Inhale 2 puffs into the lungs every 6 (six) hours as needed for wheezing.   amLODipine (NORVASC) 10 MG  tablet Take 1 tablet (10 mg total) by mouth daily.   atorvastatin (LIPITOR) 10 MG tablet Take 10 mg by mouth daily at 6 PM.   Calcium Carbonate-Vitamin D (CALTRATE 600+D) 600-400 MG-UNIT tablet Take 1 tablet by mouth 2 (two) times daily.   chlorhexidine (PERIDEX) 0.12 % solution Use as directed 15 mLs in the mouth or throat daily.   cloNIDine (CATAPRES) 0.1 MG tablet Take 0.1 mg by mouth 2 (two) times daily as needed (for SBP >160 or DBP >95).   clopidogrel (PLAVIX) 75 MG tablet Take 1 tablet (75 mg total) by mouth daily with breakfast.   folic acid (FOLVITE) 1 MG tablet Take 1 tablet (1 mg total) by mouth daily.   levETIRAcetam (KEPPRA) 500 MG tablet Take 1,500 mg by mouth 2 (two) times daily.   magnesium oxide (MAG-OX) 400 MG tablet Take 400 mg by mouth daily.   Multiple Vitamin (MULTIVITAMIN WITH MINERALS) TABS tablet Take 1 tablet by mouth daily.   Multiple Vitamins-Minerals (CERTAGEN PO) Take by mouth.   OVER THE COUNTER MEDICATION Take 1 Container by mouth 3 (three) times daily. Magic cup   pantoprazole (PROTONIX) 40 MG tablet Take 1 tablet (40 mg total) by mouth daily.   predniSONE (DELTASONE) 10 MG tablet Take 3 tabs daily for 2 days, then 2 tabs daily for 2 days, then 1 tab daily for 2 days, then stop.   senna (SENOKOT) 8.6 MG tablet Take 1 tablet by mouth daily.   tiotropium (SPIRIVA HANDIHALER) 18 MCG inhalation capsule Place 1 capsule (18 mcg total) into inhaler and inhale daily.   No facility-administered encounter medications on file as of 05/20/2021.    HOSPICE ELIGIBILITY/DIAGNOSIS: TBD  PAST MEDICAL HISTORY:  Past Medical History:  Diagnosis Date   Cerebral embolism with cerebral infarction ([place])    residual left sided weakness/notes 12/12/2013   Chronic kidney disease (CKD), stage II (mild)    [name] 12/12/2013   Colon polyps 10/20/2013   TUBULAR ADENOMA (4).   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 More than 50% of the time in this consultation was spent on counseling and coordinating communication   Thank you for the opportunity to participate in the care of [name] Please call our office at [phone] if we can be of additional assistance.  Note: Portions of this note were generated with Lobbyist. Dictation errors may occur despite best attempts at proofreading.  Teodoro Spray, NP

## 2021-09-02 ENCOUNTER — Non-Acute Institutional Stay: Payer: Commercial Managed Care - HMO | Admitting: Hospice

## 2021-09-02 ENCOUNTER — Other Ambulatory Visit: Payer: Self-pay

## 2021-09-02 ENCOUNTER — Encounter: Payer: Self-pay | Admitting: Hematology

## 2021-09-02 DIAGNOSIS — R569 Unspecified convulsions: Secondary | ICD-10-CM

## 2021-09-02 DIAGNOSIS — Z515 Encounter for palliative care: Secondary | ICD-10-CM

## 2021-09-02 DIAGNOSIS — I10 Essential (primary) hypertension: Secondary | ICD-10-CM

## 2021-09-02 DIAGNOSIS — J440 Chronic obstructive pulmonary disease with acute lower respiratory infection: Secondary | ICD-10-CM

## 2021-09-02 NOTE — Progress Notes (Signed)
[facility] Consult Note Telephone: [phone]  Fax: [phone]  PATIENT NAME: [name] DOB: [birth year] MRN: [id number]  PRIMARY CARE PROVIDER:   Wenda Low, MD Wenda Low, MD [place] [address] [place],  [place] [address]  REFERRING PROVIDER: Wenda Low, MD Wenda Low, MD [place] [address],  [place] [address]  RESPONSIBLE PARTY:   Self  Maryruth Bun, cousin  [phone] Delanna Notice, cousin [phone] Assunta Curtis, friend [phone] Contact Information     Name Relation Home Work [place] Relative   [phone]   Graves,Crystal Relative   [phone]   Georgette Shell [phone]  [phone]   Franz Dell   [phone]       Visit is to build trust and highlight Palliative Medicine as specialized medical care for people living with serious illness, aimed at facilitating better quality of life through symptoms relief, assisting with advance care planning and complex medical decision making. This is a follow up visit.  RECOMMENDATIONS/PLAN:   Advance Care Planning/Code Status: Patient affirmed he is a full code  Goals of Care: Goals of care include to maximize quality of life and symptom management.  Visit consisted of counseling and education dealing with the complex and emotionally intense issues of symptom management and palliative care in the setting of serious and potentially life-threatening illness.  Patient shared how Bingo TV and smoking keep him going. No plan to stop smoking despite education on smoking cessation. Palliative care team will continue to support patient, patient's family, and medical team.  Symptom management/Plan:  COPD: Stable. No shortness of breath/exacerbation since last visit, no hospitalization.  Patient continues to smoke; smoking cessation discussed; patient reiterated he does not plan to stop at this  time.  Continue with breathing treatments  Spiriva and Albuterol as ordered Seizure: Managed with Keppra. Hypertension: Managed with lisinopril metoprolol and amlodipine.  Follow up: Palliative care will continue to follow for complex medical decision making, advance care planning, and clarification of goals. Return 6 weeks or prn. Encouraged to call provider sooner with any concerns.  CHIEF COMPLAINT: Palliative follow up  HISTORY OF PRESENT ILLNESS:  [name] a 71 y.o. male with multiple medical problems including COPD with no recent exacerbation, hypertension, seizure disorder.  History of bone cancer, prostate cancer, PVD, left sided weakness post CVA, self propels with his wheelchair.  Patient denies pain/discomfort, no respiratory distress; nursing with no concerns today. History obtained from review of EMR, discussion with primary team, family and/or patient. Records reviewed and summarized above. All 10 point systems reviewed and are negative except as documented in history of present illness above  Review and summarization of Epic records shows history from other than patient.   Palliative Care was asked to follow this patient o help address complex decision making in the context of advance care planning and goals of care clarification.   PHYSICAL EXAM  General: In no acute distress, appropriately dressed, cooperative Cardiovascular: regular rate and rhythm; no edema in BLE Pulmonary: no cough, no increased work of breathing, normal respiratory effort Abdomen: soft, non tender, no guarding, positive bowel sounds in all quadrants GU:  no suprapubic tenderness Eyes: Normal lids, no discharge ENMT: Moist mucous membranes Musculoskeletal:  weakness, self propels wheelchair Skin: no rash to visible skin, warm without cyanosis,  Psych: non-anxious affect Neurological: Weakness but otherwise non focal Heme/lymph/immuno: no bruises, no bleeding  PERTINENT MEDICATIONS:  Outpatient  Encounter Medications as of  09/02/2021  Medication Sig   albuterol (PROVENTIL HFA;VENTOLIN HFA) 108 (90 BASE) MCG/ACT inhaler Inhale 2 puffs into the lungs every 6 (six) hours as needed for wheezing.   amLODipine (NORVASC) 10 MG tablet Take 1 tablet (10 mg total) by mouth daily.   atorvastatin (LIPITOR) 10 MG tablet Take 10 mg by mouth daily at 6 PM.   Calcium Carbonate-Vitamin D (CALTRATE 600+D) 600-400 MG-UNIT tablet Take 1 tablet by mouth 2 (two) times daily.   chlorhexidine (PERIDEX) 0.12 % solution Use as directed 15 mLs in the mouth or throat daily.   cloNIDine (CATAPRES) 0.1 MG tablet Take 0.1 mg by mouth 2 (two) times daily as needed (for SBP >160 or DBP >95).   clopidogrel (PLAVIX) 75 MG tablet Take 1 tablet (75 mg total) by mouth daily with breakfast.   folic acid (FOLVITE) 1 MG tablet Take 1 tablet (1 mg total) by mouth daily.   levETIRAcetam (KEPPRA) 500 MG tablet Take 1,500 mg by mouth 2 (two) times daily.   magnesium oxide (MAG-OX) 400 MG tablet Take 400 mg by mouth daily.   Multiple Vitamin (MULTIVITAMIN WITH MINERALS) TABS tablet Take 1 tablet by mouth daily.   Multiple Vitamins-Minerals (CERTAGEN PO) Take by mouth.   OVER THE COUNTER MEDICATION Take 1 Container by mouth 3 (three) times daily. Magic cup   pantoprazole (PROTONIX) 40 MG tablet Take 1 tablet (40 mg total) by mouth daily.   predniSONE (DELTASONE) 10 MG tablet Take 3 tabs daily for 2 days, then 2 tabs daily for 2 days, then 1 tab daily for 2 days, then stop.   senna (SENOKOT) 8.6 MG tablet Take 1 tablet by mouth daily.   tiotropium (SPIRIVA HANDIHALER) 18 MCG inhalation capsule Place 1 capsule (18 mcg total) into inhaler and inhale daily.   No facility-administered encounter medications on file as of 09/02/2021.    HOSPICE ELIGIBILITY/DIAGNOSIS: TBD  PAST MEDICAL HISTORY:  Past Medical History:  Diagnosis Date   Cerebral embolism with cerebral infarction ([facility])    residual left sided weakness/notes 12/12/2013    Chronic kidney disease (CKD), stage II (mild)    [name] 12/12/2013   Colon polyps 10/20/2013   TUBULAR ADENOMA (4).   COPD 05/18/2007   Elevated PSA 05/25/2007   High cholesterol    Hypertension 05/18/2007   Pneumonia    "couple times" (07/08/2016)   Prostate cancer ([facility])    Protein-calorie malnutrition, severe ([place])    [name] 12/12/2013   Salmonella 2017   Seizures ([place]) 12/05/2013; 12/08/2013; 12/11/2013   new onset; recurrent/notes 12/12/2013   Sepsis associated hypotension ([place]) 07/08/2016   [name] 07/08/2016   Stroke ([facility]) 10/14/2010, 11/2012   Right centrum semiovale   Stroke ([facility])    "I've had about 5" (07/08/2016)      ALLERGIES:  Allergies  Allergen Reactions   Other Other (See Comments)    Epidural caused seizure   Aspirin Hives   Penicillins Hives      I spent 50 minutes providing this consultation; this includes time spent with patient/family, chart review and documentation. More than 50% of the time in this consultation was spent on counseling and coordinating communication   Thank you for the opportunity to participate in the care of [name] Please call our office at [phone] if we can be of additional assistance.  Note: Portions of this note were generated with Lobbyist. Dictation errors may occur despite best attempts at proofreading.  Teodoro Spray, NP

## 2021-12-09 ENCOUNTER — Non-Acute Institutional Stay: Payer: Commercial Managed Care - HMO | Admitting: Hospice

## 2021-12-09 DIAGNOSIS — J209 Acute bronchitis, unspecified: Secondary | ICD-10-CM

## 2021-12-09 DIAGNOSIS — I10 Essential (primary) hypertension: Secondary | ICD-10-CM

## 2021-12-09 DIAGNOSIS — R569 Unspecified convulsions: Secondary | ICD-10-CM

## 2021-12-09 DIAGNOSIS — Z515 Encounter for palliative care: Secondary | ICD-10-CM

## 2021-12-09 NOTE — Progress Notes (Signed)
? ? ?[facility] ?[facility] Consult Note ?Telephone: [phone]  ?Fax: [phone] ? ?PATIENT NAME: [name] ?DOB: [birth year] ?MRN: [id number] ? ?PRIMARY CARE PROVIDER:   Wenda Low, MD ?Wenda Low, MD ?[address] ?[address] ?[place],  [place] [address] ? ?REFERRING PROVIDER: Wenda Low, MD ?Wenda Low, MD ?[place] [address] ?[address] ?[place],  [place] [address] ? ?RESPONSIBLE PARTY:   Self ? Maryruth Bun, cousin  [phone] ?Delanna Notice, cousin [phone] ?Assunta Curtis, friend [phone] ?Contact Information   ? ? Name Relation Home Work Mobile  ? Graves,Bernadette Relative   [phone]  ? Graves,Crystal Relative   [phone]  ? Nelson,John Friend [phone]  [phone]  ? [place] Friend   [phone]  ? ?  ? ? ?Visit is to build trust and highlight Palliative Medicine as specialized medical care for people living with serious illness, aimed at facilitating better quality of life through symptoms relief, assisting with advance care planning and complex medical decision making. This is a follow up visit. ? ?RECOMMENDATIONS/PLAN:  ? ?Advance Care Planning/Code Status: Patient is a Do Not Resuscitate, Do Not Intubate ? ?Goals of Care: Goals of care include to maximize quality of life and symptom management. ? ?Visit consisted of counseling and education dealing with the complex and emotionally intense issues of symptom management and palliative care in the setting of serious and potentially life-threatening illness.  ? ?Patient reports he copes with life by pleasures he derives form Bingo [employer] and smoking. No plan to stop smoking despite education on smoking cessation. Palliative care team will continue to support patient, patient's family, and medical team. ? ?Symptom management/Plan:  ?COPD: No exacerbation since last visit, no hospitalization. Continue with breathing treatments  Spiriva and Albuterol as ordered. Patient continues to  smoke; smoking cessation discussed; patient reiterated he does not plan to stop at this time.  ?Seizure: No seizure reported this year. Managed with Keppra. ?Hypertension: Managed with metoprolol, amlodipine, doxazosin, and clonidine as needed for blood pressure systolic greater than 789. ?Follow up: Palliative care will continue to follow for complex medical decision making, advance care planning, and clarification of goals. Return 6 weeks or prn. Encouraged to call provider sooner with any concerns. ? ?CHIEF COMPLAINT: Palliative follow up ? ?HISTORY OF PRESENT ILLNESS:  [name] a 71 y.o. male with multiple medical problems including COPD with no recent exacerbation, hypertension, seizure disorder.  History of bone cancer, prostate cancer, PVD, left sided weakness post CVA, self propels with his wheelchair.  Patient denies pain/discomfort, in no respiratory distress; nursing with no concerns today.  Collaborative discussion with [facility] nurse practitioner on patient's recalcitrant blood pressure; [facility] NP in agreement to increase doxazosin from 1 mg to 2 mg daily, and clonidine on hand as needed for systolic greater than 381.  History obtained from review of EMR, discussion with primary team, family and/or patient. Records reviewed and summarized above. All 10 point systems reviewed and are negative except as documented in history of present illness above ? ?Review and summarization of Epic records shows history from other than patient.  ? ?Palliative Care was asked to follow this patient o help address complex decision making in the context of advance care planning and goals of care clarification.  ? ?PHYSICAL EXAM  ?General: In no acute distress, appropriately dressed, cooperative ?Cardiovascular: regular rate and rhythm; no edema in BLE ?Pulmonary: no cough, no increased work of breathing, normal respiratory effort ?Abdomen: soft, non tender, no guarding, positive bowel sounds  in all quadrants ?GU:  no  suprapubic tenderness ?Eyes: Normal lids, no discharge ?ENMT: Moist mucous membranes ?Musculoskeletal:  weakness, self propels wheelchair ?Skin: no rash to visible skin, warm without cyanosis,  ?Psych: non-anxious affect ?Neurological: Weakness but otherwise non focal ?Heme/lymph/immuno: no bruises, no bleeding ? ?PERTINENT MEDICATIONS:  ?Outpatient Encounter Medications as of 12/09/2021  ?Medication Sig  ? albuterol (PROVENTIL HFA;VENTOLIN HFA) 108 (90 BASE) MCG/ACT inhaler Inhale 2 puffs into the lungs every 6 (six) hours as needed for wheezing.  ? amLODipine (NORVASC) 10 MG tablet Take 1 tablet (10 mg total) by mouth daily.  ? atorvastatin (LIPITOR) 10 MG tablet Take 10 mg by mouth daily at 6 PM.  ? Calcium Carbonate-Vitamin D (CALTRATE 600+D) 600-400 MG-UNIT tablet Take 1 tablet by mouth 2 (two) times daily.  ? chlorhexidine (PERIDEX) 0.12 % solution Use as directed 15 mLs in the mouth or throat daily.  ? cloNIDine (CATAPRES) 0.1 MG tablet Take 0.1 mg by mouth 2 (two) times daily as needed (for SBP >160 or DBP >95).  ? clopidogrel (PLAVIX) 75 MG tablet Take 1 tablet (75 mg total) by mouth daily with breakfast.  ? folic acid (FOLVITE) 1 MG tablet Take 1 tablet (1 mg total) by mouth daily.  ? levETIRAcetam (KEPPRA) 500 MG tablet Take 1,500 mg by mouth 2 (two) times daily.  ? magnesium oxide (MAG-OX) 400 MG tablet Take 400 mg by mouth daily.  ? Multiple Vitamin (MULTIVITAMIN WITH MINERALS) TABS tablet Take 1 tablet by mouth daily.  ? Multiple Vitamins-Minerals (CERTAGEN PO) Take by mouth.  ? OVER THE COUNTER MEDICATION Take 1 Container by mouth 3 (three) times daily. Magic cup  ? pantoprazole (PROTONIX) 40 MG tablet Take 1 tablet (40 mg total) by mouth daily.  ? predniSONE (DELTASONE) 10 MG tablet Take 3 tabs daily for 2 days, then 2 tabs daily for 2 days, then 1 tab daily for 2 days, then stop.  ? senna (SENOKOT) 8.6 MG tablet Take 1 tablet by mouth daily.  ? tiotropium (SPIRIVA HANDIHALER) 18 MCG inhalation capsule  Place 1 capsule (18 mcg total) into inhaler and inhale daily.  ? ?No facility-administered encounter medications on file as of 12/09/2021.  ? ? ?HOSPICE ELIGIBILITY/DIAGNOSIS: TBD ? ?PAST MEDICAL HISTORY:  ?Past Medical History:  ?Diagnosis Date  ? Cerebral embolism with cerebral infarction [facility])   ? residual left sided weakness/notes 12/12/2013  ? Chronic kidney disease (CKD), stage II (mild)   ? [name] 12/12/2013  ? Colon polyps 10/20/2013  ? TUBULAR ADENOMA (4).  ? COPD 05/18/2007  ? Elevated PSA 05/25/2007  ? High cholesterol   ? Hypertension 05/18/2007  ? Pneumonia   ? "couple times" (07/08/2016)  ? Prostate cancer ([place] [facility])   ? Protein-calorie malnutrition, severe ([place])   ? [name] 12/12/2013  ? Salmonella 2017  ? Seizures ([place]) 12/05/2013; 12/08/2013; 12/11/2013  ? new onset; recurrent/notes 12/12/2013  ? Sepsis associated hypotension ([place]) 07/08/2016  ? [name] 07/08/2016  ? Stroke ([facility]) 10/14/2010, 11/2012  ? Right centrum semiovale  ? Stroke [facility])   ? "I've had about 5" (07/08/2016)  ?  ? ? ?ALLERGIES:  ?Allergies  ?Allergen Reactions  ? Other Other (See Comments)  ?  Epidural caused seizure  ? Aspirin Hives  ? Penicillins Hives  ?   ? ?I spent 50 minutes providing this consultation; this includes time spent with patient/family, chart review and documentation. More than 50% of the time in this consultation was spent on counseling and coordinating communication  ? ?Thank  you for the opportunity to participate in the care of [name] Please call our office at [phone] if we can be of additional assistance. ? ?Note: Portions of this note were generated with Lobbyist. Dictation errors may occur despite best attempts at proofreading. ? ?Teodoro Spray, NP ? ?  ?

## 2022-07-20 ENCOUNTER — Encounter: Payer: Self-pay | Admitting: Hematology

## 2022-07-20 ENCOUNTER — Non-Acute Institutional Stay: Payer: Commercial Managed Care - HMO | Admitting: Hospice

## 2022-07-20 DIAGNOSIS — I10 Essential (primary) hypertension: Secondary | ICD-10-CM

## 2022-07-20 DIAGNOSIS — J209 Acute bronchitis, unspecified: Secondary | ICD-10-CM

## 2022-07-20 DIAGNOSIS — Z515 Encounter for palliative care: Secondary | ICD-10-CM

## 2022-07-20 DIAGNOSIS — C61 Malignant neoplasm of prostate: Secondary | ICD-10-CM

## 2022-07-20 DIAGNOSIS — R569 Unspecified convulsions: Secondary | ICD-10-CM

## 2022-07-20 NOTE — Progress Notes (Signed)
[facility] Consult Note Telephone: [phone]  Fax: [phone]  PATIENT NAME: [name] DOB: 21-Dec-1950 MRN: [id number]  PRIMARY CARE PROVIDER:   Wenda Low, MD Wenda Low, MD [place] [address] [place],  [place] [address]  REFERRING PROVIDER: Wenda Low, MD Wenda Low, MD [place] [address] [place],  [place] [address]  RESPONSIBLE PARTY:   Self  Maryruth Bun, cousin  [phone] Delanna Notice, cousin [phone] Assunta Curtis, friend [phone] Contact Information     Name Relation Home Work [place] Relative   [phone]   Graves,Crystal Relative   [phone]   Georgette Shell [phone]  [phone]   Franz Dell   [phone]       Visit is to build trust and highlight Palliative Medicine as specialized medical care for people living with serious illness, aimed at facilitating better quality of life through symptoms relief, assisting with advance care planning and complex medical decision making. This is a follow up visit.  RECOMMENDATIONS/PLAN:   Advance Care Planning/Code Status: Patient is a Do Not Resuscitate, Do Not Intubate  Goals of Care: Goals of care include to maximize quality of life and symptom management.  Visit consisted of counseling and education dealing with the complex and emotionally intense issues of symptom management and palliative care in the setting of serious and potentially life-threatening illness.    Palliative care team will continue to support patient, patient's family, and medical team.  Symptom management/Plan:  COPD: Managed with  Spiriva and Albuterol as ordered. Patient continues to smoke; patient reiterated he does not plan to stop at this time. Smoking cessation discussed. Seizure: No recent seizure. Continue Keppra. 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 Continue follow up with  Oncology as planned.  Hypertension: Managed with metoprolol, amlodipine, doxazosin, and clonidine as needed for blood pressure systolic greater than 801. Follow up: Palliative care will continue to follow for complex medical decision making, advance care planning, and clarification of goals. Return 6 weeks or prn. Encouraged to call provider sooner with any concerns.  CHIEF COMPLAINT: Palliative follow up  HISTORY OF PRESENT ILLNESS:  [name] a 71 y.o. male with multiple medical problems including COPD with no recent exacerbation, hypertension, seizure disorder.  History of bone cancer, prostate cancer, PVD, left sided weakness post CVA. History obtained from review of EMR, discussion with primary team, family and/or patient. Records reviewed and summarized above. All 10 point systems reviewed and are negative except as documented in history of present illness above  Review and summarization of Epic records shows history from other than patient.   Palliative Care was asked to follow this patient o help address complex decision making in the context of advance care planning and goals of care clarification.   PERTINENT MEDICATIONS:  Outpatient Encounter Medications as of 07/20/2022  Medication Sig   albuterol (PROVENTIL HFA;VENTOLIN HFA) 108 (90 BASE) MCG/ACT inhaler Inhale 2 puffs into the lungs every 6 (six) hours as needed for wheezing.   amLODipine (NORVASC) 10 MG tablet Take 1 tablet (10 mg total) by mouth daily.   atorvastatin (LIPITOR) 10 MG tablet Take 10 mg by mouth daily at 6 PM.   Calcium Carbonate-Vitamin D (CALTRATE 600+D) 600-400 MG-UNIT tablet Take 1 tablet by mouth 2 (two) times daily.   chlorhexidine (PERIDEX) 0.12 % solution Use as directed 15 mLs in the mouth or throat daily.   cloNIDine (CATAPRES) 0.1 MG tablet Take 0.1 mg by  mouth 2 (two) times daily as needed (for SBP >160 or DBP >95).   clopidogrel (PLAVIX) 75 MG tablet Take 1 tablet (75 mg total) by mouth daily with  breakfast.   folic acid (FOLVITE) 1 MG tablet Take 1 tablet (1 mg total) by mouth daily.   levETIRAcetam (KEPPRA) 500 MG tablet Take 1,500 mg by mouth 2 (two) times daily.   magnesium oxide (MAG-OX) 400 MG tablet Take 400 mg by mouth daily.   Multiple Vitamin (MULTIVITAMIN WITH MINERALS) TABS tablet Take 1 tablet by mouth daily.   Multiple Vitamins-Minerals (CERTAGEN PO) Take by mouth.   OVER THE COUNTER MEDICATION Take 1 Container by mouth 3 (three) times daily. Magic cup   pantoprazole (PROTONIX) 40 MG tablet Take 1 tablet (40 mg total) by mouth daily.   predniSONE (DELTASONE) 10 MG tablet Take 3 tabs daily for 2 days, then 2 tabs daily for 2 days, then 1 tab daily for 2 days, then stop.   senna (SENOKOT) 8.6 MG tablet Take 1 tablet by mouth daily.   tiotropium (SPIRIVA HANDIHALER) 18 MCG inhalation capsule Place 1 capsule (18 mcg total) into inhaler and inhale daily.   No facility-administered encounter medications on file as of 07/20/2022.    HOSPICE ELIGIBILITY/DIAGNOSIS: TBD  PAST MEDICAL HISTORY:  Past Medical History:  Diagnosis Date   Cerebral embolism with cerebral infarction ([place])    residual left sided weakness/notes 12/12/2013   Chronic kidney disease (CKD), stage II (mild)    [name] 12/12/2013   Colon polyps 10/20/2013   TUBULAR ADENOMA (4).   COPD 05/18/2007   Elevated PSA 05/25/2007   High cholesterol    Hypertension 05/18/2007   Pneumonia    "couple times" (07/08/2016)   Prostate cancer ([place])    Protein-calorie malnutrition, severe ([place])    [name] 12/12/2013   Salmonella 2017   Seizures ([place]) 12/05/2013; 12/08/2013; 12/11/2013   new onset; recurrent/notes 12/12/2013   Sepsis associated hypotension ([place] [place]) 07/08/2016   [name] 07/08/2016   Stroke ([place]) 10/14/2010, 11/2012   Right centrum semiovale   Stroke ([place])    "I've had about 5" (07/08/2016)      ALLERGIES:  Allergies  Allergen Reactions   Other Other (See Comments)    Epidural caused seizure   Aspirin Hives    Penicillins Hives      I spent 45 minutes providing this consultation; this includes time spent with patient/family, chart review and documentation. More than 50% of the time in this consultation was spent on counseling and coordinating communication   Thank you for the opportunity to participate in the care of ELDO UMANZOR Please call our office at [phone] if we can be of additional assistance.  Note: Portions of this note were generated with Lobbyist. Dictation errors may occur despite best attempts at proofreading.  Teodoro Spray, NP

## 2022-09-03 ENCOUNTER — Encounter: Payer: Self-pay | Admitting: Hematology

## 2022-09-03 ENCOUNTER — Non-Acute Institutional Stay: Payer: Commercial Managed Care - HMO | Admitting: Hospice

## 2022-09-03 DIAGNOSIS — J209 Acute bronchitis, unspecified: Secondary | ICD-10-CM

## 2022-09-03 DIAGNOSIS — R569 Unspecified convulsions: Secondary | ICD-10-CM

## 2022-09-03 DIAGNOSIS — I10 Essential (primary) hypertension: Secondary | ICD-10-CM

## 2022-09-03 DIAGNOSIS — C61 Malignant neoplasm of prostate: Secondary | ICD-10-CM

## 2022-09-03 DIAGNOSIS — Z515 Encounter for palliative care: Secondary | ICD-10-CM

## 2022-09-03 NOTE — Progress Notes (Signed)
[facility] Consult Note Telephone: [phone]  Fax: [phone]  PATIENT NAME: [name] DOB: [birth year] MRN: [id number]  PRIMARY CARE PROVIDER:   Wenda Low, MD Wenda Low, MD [place] [address],  [place] [address]  REFERRING PROVIDER: Wenda Low, MD Wenda Low, MD [place] [address],  [place] [address]  RESPONSIBLE PARTY:   Self  Maryruth Bun, cousin  [phone] Delanna Notice, cousin [phone] Assunta Curtis, friend [phone] Contact Information     Name Relation Home Work [place] Relative   [phone]   Graves,Crystal Relative   [phone]   Georgette Shell [phone]  [phone]   Franz Dell   [phone]       Visit is to build trust and highlight Palliative Medicine as specialized medical care for people living with serious illness, aimed at facilitating better quality of life through symptoms relief, assisting with advance care planning and complex medical decision making. This is a follow up visit.  RECOMMENDATIONS/PLAN:   Advance Care Planning/Code Status: Patient is a Do Not Resuscitate, Do Not Intubate  Goals of Care: Goals of care include to maximize quality of life and symptom management.  Visit consisted of counseling and education dealing with the complex and emotionally intense issues of symptom management and palliative care in the setting of serious and potentially life-threatening illness.    Palliative care team will continue to support patient, patient's family, and medical team.  Symptom management/Plan:  COPD: aggravated with recent COVID 52, completed treatment, back to baseline. Continue Spiriva and Albuterol as ordered. Patient continues to smoke; patient reiterated he does not plan to stop at this time. Smoking cessation discussed. Seizure: No recent seizure. Continue Keppra. 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 Compliance with appointments and treatments with Oncology team. Continue follow up with Oncology as planned.  Hypertension: Managed with metoprolol, amlodipine, doxazosin, and clonidine as needed for blood pressure systolic greater than 242. Follow up: Palliative care will continue to follow for complex medical decision making, advance care planning, and clarification of goals. Return 6 weeks or prn. Encouraged to call provider sooner with any concerns.  CHIEF COMPLAINT: Palliative follow up  HISTORY OF PRESENT ILLNESS:  [name] a 72 y.o. male with multiple medical problems including COPD with no recent exacerbation, hypertension, seizure disorder.  History of bone cancer, prostate cancer, PVD, left sided weakness post CVA. History obtained from review of EMR, discussion with primary team, family and/or patient. Records reviewed and summarized above.  Report of COVID-19 infection last week.  Which patient has completed treatment; in no respiratory distress.  All 10 point systems reviewed and are negative except as documented in history of present illness above  Review and summarization of Epic records shows history from other than patient.   Palliative Care was asked to follow this patient o help address complex decision making in the context of advance care planning and goals of care clarification.   PERTINENT MEDICATIONS:  Outpatient Encounter Medications as of 09/03/2022  Medication Sig   albuterol (PROVENTIL HFA;VENTOLIN HFA) 108 (90 BASE) MCG/ACT inhaler Inhale 2 puffs into the lungs every 6 (six) hours as needed for wheezing.   amLODipine (NORVASC) 10 MG tablet Take 1 tablet (10 mg total) by mouth daily.   atorvastatin (LIPITOR) 10 MG tablet Take 10 mg by mouth daily at 6 PM.   Calcium Carbonate-Vitamin D (CALTRATE 600+D) 600-400 MG-UNIT tablet Take 1 tablet by  mouth 2 (two) times daily.   chlorhexidine (PERIDEX) 0.12 % solution Use as directed 15 mLs  in the mouth or throat daily.   cloNIDine (CATAPRES) 0.1 MG tablet Take 0.1 mg by mouth 2 (two) times daily as needed (for SBP >160 or DBP >95).   clopidogrel (PLAVIX) 75 MG tablet Take 1 tablet (75 mg total) by mouth daily with breakfast.   folic acid (FOLVITE) 1 MG tablet Take 1 tablet (1 mg total) by mouth daily.   levETIRAcetam (KEPPRA) 500 MG tablet Take 1,500 mg by mouth 2 (two) times daily.   magnesium oxide (MAG-OX) 400 MG tablet Take 400 mg by mouth daily.   Multiple Vitamin (MULTIVITAMIN WITH MINERALS) TABS tablet Take 1 tablet by mouth daily.   Multiple Vitamins-Minerals (CERTAGEN PO) Take by mouth.   OVER THE COUNTER MEDICATION Take 1 Container by mouth 3 (three) times daily. Magic cup   pantoprazole (PROTONIX) 40 MG tablet Take 1 tablet (40 mg total) by mouth daily.   predniSONE (DELTASONE) 10 MG tablet Take 3 tabs daily for 2 days, then 2 tabs daily for 2 days, then 1 tab daily for 2 days, then stop.   senna (SENOKOT) 8.6 MG tablet Take 1 tablet by mouth daily.   tiotropium (SPIRIVA HANDIHALER) 18 MCG inhalation capsule Place 1 capsule (18 mcg total) into inhaler and inhale daily.   No facility-administered encounter medications on file as of 09/03/2022.    HOSPICE ELIGIBILITY/DIAGNOSIS: TBD  PAST MEDICAL HISTORY:  Past Medical History:  Diagnosis Date   Cerebral embolism with cerebral infarction ([facility])    residual left sided weakness/notes 12/12/2013   Chronic kidney disease (CKD), stage II (mild)    [name] 12/12/2013   Colon polyps 10/20/2013   TUBULAR ADENOMA (4).   COPD 05/18/2007   Elevated PSA 05/25/2007   High cholesterol    Hypertension 05/18/2007   Pneumonia    "couple times" (07/08/2016)   Prostate cancer ([facility])    Protein-calorie malnutrition, severe ([facility])    [name] 12/12/2013   Salmonella 2017   Seizures ([facility]) 12/05/2013; 12/08/2013; 12/11/2013   new onset; recurrent/notes 12/12/2013   Sepsis associated hypotension ([facility]) 07/08/2016   [name] 07/08/2016   Stroke ([facility])  10/14/2010, 11/2012   Right centrum semiovale   Stroke ([facility])    "I've had about 5" (07/08/2016)      ALLERGIES:  Allergies  Allergen Reactions   Other Other (See Comments)    Epidural caused seizure   Aspirin Hives   Penicillins Hives      I spent 40 minutes providing this consultation; this includes time spent with patient/family, chart review and documentation. More than 50% of the time in this consultation was spent on counseling and coordinating communication   Thank you for the opportunity to participate in the care of [name] Please call our office at [phone] if we can be of additional assistance.  Note: Portions of this note were generated with Lobbyist. Dictation errors may occur despite best attempts at proofreading.  Teodoro Spray, NP

## 2022-10-05 ENCOUNTER — Encounter: Payer: Self-pay | Admitting: Hematology

## 2022-10-05 ENCOUNTER — Non-Acute Institutional Stay: Payer: Commercial Managed Care - HMO | Admitting: Hospice

## 2022-10-05 DIAGNOSIS — I10 Essential (primary) hypertension: Secondary | ICD-10-CM

## 2022-10-05 DIAGNOSIS — Z515 Encounter for palliative care: Secondary | ICD-10-CM

## 2022-10-05 DIAGNOSIS — J209 Acute bronchitis, unspecified: Secondary | ICD-10-CM

## 2022-10-05 DIAGNOSIS — R569 Unspecified convulsions: Secondary | ICD-10-CM

## 2022-10-05 DIAGNOSIS — C61 Malignant neoplasm of prostate: Secondary | ICD-10-CM

## 2022-10-05 NOTE — Progress Notes (Signed)
[facility] Consult Note Telephone: [phone]  Fax: [phone]  PATIENT NAME: [name] DOB: [birth year] MRN: [id number]  PRIMARY CARE PROVIDER:   Wenda Low, MD Wenda Low, MD [place] [address],  [place] [address]  REFERRING PROVIDER: Wenda Low, MD Wenda Low, MD [place] [address] [place] [employer],  [place] [address]  RESPONSIBLE PARTY:   Self  Maryruth Bun, cousin  [phone] Delanna Notice, cousin [phone] Assunta Curtis, friend [phone] Contact Information     Name Relation Home Work [place] Relative   [phone]   Graves,Crystal Relative   [phone]   Georgette Shell [id number]  [phone]   Franz Dell   [phone]       Visit is to build trust and highlight Palliative Medicine as specialized medical care for people living with serious illness, aimed at facilitating better quality of life through symptoms relief, assisting with advance care planning and complex medical decision making. This is a follow up visit.  RECOMMENDATIONS/PLAN:   Advance Care Planning/Code Status: Patient is a Do Not Resuscitate, Do Not Intubate  Goals of Care: Goals of care include to maximize quality of life and symptom management.  Visit consisted of counseling and education dealing with the complex and emotionally intense issues of symptom management and palliative care in the setting of serious and potentially life-threatening illness.    Palliative care team will continue to support patient, patient's family, and medical team.  Symptom management/Plan:  COPD: back to baseline since COVID-19 infection resolved, no longer requiring oxygen supplementation. Continue Spiriva and Albuterol as ordered. Patient continues to smoke; patient reiterated he does not plan to stop at this time. Smoking cessation discussed. Seizure: No recent seizure. Continue  Keppra. 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 Compliance with appointments and treatments with Oncology team. Continue follow up with Oncology as planned.  Hypertension: Stable. Managed with metoprolol, amlodipine, doxazosin, and clonidine as needed for blood pressure systolic greater than 0000000. Follow up: Palliative care will continue to follow for complex medical decision making, advance care planning, and clarification of goals. Return 6 weeks or prn. Encouraged to call provider sooner with any concerns.  CHIEF COMPLAINT: Palliative follow up  HISTORY OF PRESENT ILLNESS:  [name] a 72 y.o. male with multiple medical problems including COPD, hypertension, seizure disorder.  History of bone cancer, prostate cancer, PVD, left sided weakness post CVA. Hx of recent COVID 19 infection, not requiring hospitalization. History obtained from review of EMR, discussion with primary team, family and/or patient. Records reviewed and summarized above.  All 10 point systems reviewed and are negative except as documented in history of present illness above  Review and summarization of Epic records shows history from other than patient.   Palliative Care was asked to follow this patient o help address complex decision making in the context of advance care planning and goals of care clarification.   PERTINENT MEDICATIONS:  Outpatient Encounter Medications as of 10/05/2022  Medication Sig   albuterol (PROVENTIL HFA;VENTOLIN HFA) 108 (90 BASE) MCG/ACT inhaler Inhale 2 puffs into the lungs every 6 (six) hours as needed for wheezing.   amLODipine (NORVASC) 10 MG tablet Take 1 tablet (10 mg total) by mouth daily.   atorvastatin (LIPITOR) 10 MG tablet Take 10 mg by mouth daily at 6 PM.   Calcium Carbonate-Vitamin D (CALTRATE 600+D) 600-400 MG-UNIT tablet Take 1 tablet by mouth 2 (two) times daily.   chlorhexidine (Stamps)  0.12 % solution Use as directed 15 mLs in the mouth or throat daily.    cloNIDine (CATAPRES) 0.1 MG tablet Take 0.1 mg by mouth 2 (two) times daily as needed (for SBP >160 or DBP >95).   clopidogrel (PLAVIX) 75 MG tablet Take 1 tablet (75 mg total) by mouth daily with breakfast.   folic acid (FOLVITE) 1 MG tablet Take 1 tablet (1 mg total) by mouth daily.   levETIRAcetam (KEPPRA) 500 MG tablet Take 1,500 mg by mouth 2 (two) times daily.   magnesium oxide (MAG-OX) 400 MG tablet Take 400 mg by mouth daily.   Multiple Vitamin (MULTIVITAMIN WITH MINERALS) TABS tablet Take 1 tablet by mouth daily.   Multiple Vitamins-Minerals (CERTAGEN PO) Take by mouth.   OVER THE COUNTER MEDICATION Take 1 Container by mouth 3 (three) times daily. Magic cup   pantoprazole (PROTONIX) 40 MG tablet Take 1 tablet (40 mg total) by mouth daily.   predniSONE (DELTASONE) 10 MG tablet Take 3 tabs daily for 2 days, then 2 tabs daily for 2 days, then 1 tab daily for 2 days, then stop.   senna (SENOKOT) 8.6 MG tablet Take 1 tablet by mouth daily.   tiotropium (SPIRIVA HANDIHALER) 18 MCG inhalation capsule Place 1 capsule (18 mcg total) into inhaler and inhale daily.   No facility-administered encounter medications on file as of 10/05/2022.    HOSPICE ELIGIBILITY/DIAGNOSIS: TBD  PAST MEDICAL HISTORY:  Past Medical History:  Diagnosis Date   Cerebral embolism with cerebral infarction ([facility])    residual left sided weakness/notes 12/12/2013   Chronic kidney disease (CKD), stage II (mild)    [name] 12/12/2013   Colon polyps 10/20/2013   TUBULAR ADENOMA (4).   COPD 05/18/2007   Elevated PSA 05/25/2007   High cholesterol    Hypertension 05/18/2007   Pneumonia    "couple times" (07/08/2016)   Prostate cancer ([facility])    Protein-calorie malnutrition, severe ([facility])    [name] 12/12/2013   Salmonella 2017   Seizures ([facility]) 12/05/2013; 12/08/2013; 12/11/2013   new onset; recurrent/notes 12/12/2013   Sepsis associated hypotension ([facility]) 07/08/2016   [name] 07/08/2016   Stroke ([facility]) 10/14/2010, 11/2012   Right  centrum semiovale   Stroke ([facility])    "I've had about 5" (07/08/2016)      ALLERGIES:  Allergies  Allergen Reactions   Other Other (See Comments)    Epidural caused seizure   Aspirin Hives   Penicillins Hives      I spent 40 minutes providing this consultation; this includes time spent with patient/family, chart review and documentation. More than 50% of the time in this consultation was spent on counseling and coordinating communication   Thank you for the opportunity to participate in the care of YORICK OHMART Please call our office at [phone] if we can be of additional assistance.  Note: Portions of this note were generated with Lobbyist. Dictation errors may occur despite best attempts at proofreading.  Teodoro Spray, NP

## 2022-11-04 ENCOUNTER — Non-Acute Institutional Stay: Payer: Medicare Other | Admitting: Hospice

## 2022-11-04 DIAGNOSIS — J209 Acute bronchitis, unspecified: Secondary | ICD-10-CM

## 2022-11-04 DIAGNOSIS — I10 Essential (primary) hypertension: Secondary | ICD-10-CM

## 2022-11-04 DIAGNOSIS — C61 Malignant neoplasm of prostate: Secondary | ICD-10-CM

## 2022-11-04 DIAGNOSIS — Z515 Encounter for palliative care: Secondary | ICD-10-CM

## 2022-11-04 DIAGNOSIS — R569 Unspecified convulsions: Secondary | ICD-10-CM

## 2022-11-04 NOTE — Progress Notes (Signed)
[facility] Consult Note Telephone: [phone]  Fax: [phone]  PATIENT NAME: [name] DOB: 28-Aug-1950 MRN: [id number]  PRIMARY CARE PROVIDER:   Wenda Low, MD Wenda Low, MD [place] [address] [facility] [address],  [place] [address]  REFERRING PROVIDER: Wenda Low, MD Wenda Low, MD [place] [address],  [place] [address]  RESPONSIBLE PARTY:   Self  Maryruth Bun, cousin  [phone] Delanna Notice, cousin [phone] Assunta Curtis, friend [phone] Contact Information     Name Relation Home Work [place] Relative   [phone]   Graves,Crystal Relative   [phone]   Georgette Shell [id number]  [phone]   Franz Dell   [phone]       Visit is to build trust and highlight Palliative Medicine as specialized medical care for people living with serious illness, aimed at facilitating better quality of life through symptoms relief, assisting with advance care planning and complex medical decision making. This is a follow up visit.  RECOMMENDATIONS/PLAN:   Advance Care Planning/Code Status: Patient is a Do Not Resuscitate, Do Not Intubate  Goals of Care: Goals of care include to maximize quality of life and symptom management.  Visit consisted of counseling and education dealing with the complex and emotionally intense issues of symptom management and palliative care in the setting of serious and potentially life-threatening illness.    Palliative care team will continue to support patient, patient's family, and medical team.  Symptom management/Plan:  COPD: Continue Tiotropium Bromide Monohydrate  and Albuterol as ordered, no longer using oxygen supplementation. Patient continues to smoke. Smoking cessation discussed. Seizure: No recent seizure. Continue Keppra. 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 Continue follow up with  Oncology as planned.  Hypertension: Managed with metoprolol, amlodipine, Aldactone, doxazosin, and clonidine as needed for blood pressure systolic greater than 0000000. BP today 140/68/ Continue to monitor BP daily  as ordered Follow up: Palliative care will continue to follow for complex medical decision making, advance care planning, and clarification of goals. Return 6 weeks or prn. Encouraged to call provider sooner with any concerns.  CHIEF COMPLAINT: Palliative follow up  HISTORY OF PRESENT ILLNESS:  [name] a 72 y.o. male with multiple medical problems including COPD, hypertension, seizure disorder.  History of bone cancer, prostate cancer, PVD, Hypomagnesemia. left sided weakness post CVA. Hx of recent COVID 19 infection, not requiring hospitalization. Patient denies difficulty breathing, no pain/discomfort. History obtained from review of EMR, discussion with primary team, family and/or patient. Records reviewed and summarized above.  All 10 point systems reviewed and are negative except as documented in history of present illness above  Review and summarization of Epic records shows history from other than patient.   Palliative Care was asked to follow this patient o help address complex decision making in the context of advance care planning and goals of care clarification.   PERTINENT MEDICATIONS:  Outpatient Encounter Medications as of 11/04/2022  Medication Sig   albuterol (PROVENTIL HFA;VENTOLIN HFA) 108 (90 BASE) MCG/ACT inhaler Inhale 2 puffs into the lungs every 6 (six) hours as needed for wheezing.   amLODipine (NORVASC) 10 MG tablet Take 1 tablet (10 mg total) by mouth daily.   atorvastatin (LIPITOR) 10 MG tablet Take 10 mg by mouth daily at 6 PM.   Calcium Carbonate-Vitamin D (CALTRATE 600+D) 600-400 MG-UNIT tablet Take 1 tablet by mouth 2 (two) times daily.   chlorhexidine (PERIDEX) 0.12 % solution Use as  directed 15 mLs in the mouth or throat daily.   cloNIDine (CATAPRES)  0.1 MG tablet Take 0.1 mg by mouth 2 (two) times daily as needed (for SBP >160 or DBP >95).   clopidogrel (PLAVIX) 75 MG tablet Take 1 tablet (75 mg total) by mouth daily with breakfast.   folic acid (FOLVITE) 1 MG tablet Take 1 tablet (1 mg total) by mouth daily.   levETIRAcetam (KEPPRA) 500 MG tablet Take 1,500 mg by mouth 2 (two) times daily.   magnesium oxide (MAG-OX) 400 MG tablet Take 400 mg by mouth daily.   Multiple Vitamin (MULTIVITAMIN WITH MINERALS) TABS tablet Take 1 tablet by mouth daily.   Multiple Vitamins-Minerals (CERTAGEN PO) Take by mouth.   OVER THE COUNTER MEDICATION Take 1 Container by mouth 3 (three) times daily. Magic cup   pantoprazole (PROTONIX) 40 MG tablet Take 1 tablet (40 mg total) by mouth daily.   predniSONE (DELTASONE) 10 MG tablet Take 3 tabs daily for 2 days, then 2 tabs daily for 2 days, then 1 tab daily for 2 days, then stop.   senna (SENOKOT) 8.6 MG tablet Take 1 tablet by mouth daily.   tiotropium (SPIRIVA HANDIHALER) 18 MCG inhalation capsule Place 1 capsule (18 mcg total) into inhaler and inhale daily.   No facility-administered encounter medications on file as of 11/04/2022.    HOSPICE ELIGIBILITY/DIAGNOSIS: TBD  PAST MEDICAL HISTORY:  Past Medical History:  Diagnosis Date   Cerebral embolism with cerebral infarction ([facility])    residual left sided weakness/notes 12/12/2013   Chronic kidney disease (CKD), stage II (mild)    [name] 12/12/2013   Colon polyps 10/20/2013   TUBULAR ADENOMA (4).   COPD 05/18/2007   Elevated PSA 05/25/2007   High cholesterol    Hypertension 05/18/2007   Pneumonia    "couple times" (07/08/2016)   Prostate cancer ([facility])    Protein-calorie malnutrition, severe ([facility])    [name] 12/12/2013   Salmonella 2017   Seizures ([facility]) 12/05/2013; 12/08/2013; 12/11/2013   new onset; recurrent/notes 12/12/2013   Sepsis associated hypotension ([facility]) 07/08/2016   [name] 07/08/2016   Stroke ([facility]) 10/14/2010, 11/2012   Right centrum semiovale    Stroke ([facility])    "I've had about 5" (07/08/2016)      ALLERGIES:  Allergies  Allergen Reactions   Other Other (See Comments)    Epidural caused seizure   Aspirin Hives   Penicillins Hives      I spent 40 minutes providing this consultation; this includes time spent with patient/family, chart review and documentation. More than 50% of the time in this consultation was spent on counseling and coordinating communication   Thank you for the opportunity to participate in the care of [name] Please call our office at [phone] if we can be of additional assistance.  Note: Portions of this note were generated with Lobbyist. Dictation errors may occur despite best attempts at proofreading.  Teodoro Spray, NP

## 2022-11-11 ENCOUNTER — Encounter (HOSPITAL_COMMUNITY): Payer: Self-pay | Admitting: Emergency Medicine

## 2022-11-11 ENCOUNTER — Emergency Department (HOSPITAL_COMMUNITY): Payer: Medicare Other

## 2022-11-11 ENCOUNTER — Other Ambulatory Visit: Payer: Self-pay

## 2022-11-11 ENCOUNTER — Emergency Department (HOSPITAL_COMMUNITY)
Admission: EM | Admit: 2022-11-11 | Discharge: 2022-11-12 | Disposition: A | Discharge status: Home / Self Care | Payer: Medicare Other | Attending: Emergency Medicine | Admitting: Emergency Medicine

## 2022-11-11 DIAGNOSIS — R4182 Altered mental status, unspecified: Secondary | ICD-10-CM | POA: Insufficient documentation

## 2022-11-11 DIAGNOSIS — R55 Syncope and collapse: Secondary | ICD-10-CM | POA: Diagnosis present

## 2022-11-11 DIAGNOSIS — E860 Dehydration: Secondary | ICD-10-CM

## 2022-11-11 LAB — RAPID URINE DRUG SCREEN, HOSP PERFORMED
Amphetamines: NOT DETECTED
Barbiturates: NOT DETECTED
Benzodiazepines: NOT DETECTED
Cocaine: NOT DETECTED
Opiates: NOT DETECTED
Tetrahydrocannabinol: NOT DETECTED

## 2022-11-11 LAB — HEPATIC FUNCTION PANEL
ALT: 11 U/L (ref 0–44)
AST: 18 U/L (ref 15–41)
Albumin: 3 g/dL — ABNORMAL LOW (ref 3.5–5.0)
Alkaline Phosphatase: 58 U/L (ref 38–126)
Bilirubin, Direct: <0.1 mg/dL (ref 0.0–0.2)
Total Bilirubin: 0.5 mg/dL (ref 0.3–1.2)
Total Protein: 7.9 g/dL (ref 6.5–8.1)

## 2022-11-11 LAB — CBC
HCT: 29.3 % — ABNORMAL LOW (ref 39.0–52.0)
Hemoglobin: 9.1 g/dL — ABNORMAL LOW (ref 13.0–17.0)
MCH: 29.6 pg (ref 26.0–34.0)
MCHC: 31.1 g/dL (ref 30.0–36.0)
MCV: 95.4 fL (ref 80.0–100.0)
Platelets: 252 10*3/uL (ref 150–400)
RBC: 3.07 MIL/uL — ABNORMAL LOW (ref 4.22–5.81)
RDW: 13.7 % (ref 11.5–15.5)
WBC: 5.1 10*3/uL (ref 4.0–10.5)
nRBC: 0 % (ref 0.0–0.2)

## 2022-11-11 LAB — I-STAT VENOUS BLOOD GAS, ED
Acid-Base Excess: 5 mmol/L — ABNORMAL HIGH (ref 0.0–2.0)
Bicarbonate: 26.2 mmol/L (ref 20.0–28.0)
Calcium, Ion: 1.24 mmol/L (ref 1.15–1.40)
HCT: 27 % — ABNORMAL LOW (ref 39.0–52.0)
Hemoglobin: 9.2 g/dL — ABNORMAL LOW (ref 13.0–17.0)
O2 Saturation: 78 %
Potassium: 4.5 mmol/L (ref 3.5–5.1)
Sodium: 137 mmol/L (ref 135–145)
TCO2: 27 mmol/L (ref 22–32)
pCO2, Ven: 25.8 mmHg — ABNORMAL LOW (ref 44–60)
pH, Ven: 7.614 (ref 7.25–7.43)
pO2, Ven: 34 mmHg (ref 32–45)

## 2022-11-11 LAB — BASIC METABOLIC PANEL
Anion gap: 9 (ref 5–15)
BUN: 28 mg/dL — ABNORMAL HIGH (ref 8–23)
CO2: 25 mmol/L (ref 22–32)
Calcium: 11 mg/dL — ABNORMAL HIGH (ref 8.9–10.3)
Chloride: 101 mmol/L (ref 98–111)
Creatinine, Ser: 2.07 mg/dL — ABNORMAL HIGH (ref 0.61–1.24)
GFR, Estimated: 33 mL/min — ABNORMAL LOW (ref >60)
Glucose, Bld: 121 mg/dL — ABNORMAL HIGH (ref 70–99)
Potassium: 4.2 mmol/L (ref 3.5–5.1)
Sodium: 135 mmol/L (ref 135–145)

## 2022-11-11 LAB — URINALYSIS, ROUTINE W REFLEX MICROSCOPIC
Bilirubin Urine: NEGATIVE
Glucose, UA: NEGATIVE mg/dL
Hgb urine dipstick: NEGATIVE
Ketones, ur: NEGATIVE mg/dL
Leukocytes,Ua: NEGATIVE
Nitrite: NEGATIVE
Protein, ur: NEGATIVE mg/dL
Specific Gravity, Urine: 1.012 (ref 1.005–1.030)
pH: 5 (ref 5.0–8.0)

## 2022-11-11 LAB — TROPONIN I (HIGH SENSITIVITY)
Troponin I (High Sensitivity): 11 ng/L (ref <18)
Troponin I (High Sensitivity): 16 ng/L (ref <18)

## 2022-11-11 LAB — AMMONIA: Ammonia: 46 umol/L — ABNORMAL HIGH (ref 9–35)

## 2022-11-11 LAB — CBG MONITORING, ED: Glucose-Capillary: 114 mg/dL — ABNORMAL HIGH (ref 70–99)

## 2022-11-11 LAB — LACTIC ACID, PLASMA: Lactic Acid, Venous: 1.5 mmol/L (ref 0.5–1.9)

## 2022-11-11 LAB — ETHANOL: Alcohol, Ethyl (B): <10 mg/dL (ref <10)

## 2022-11-11 MED ORDER — SODIUM CHLORIDE 0.9 % IV BOLUS
1000.0000 mL | Freq: Once | INTRAVENOUS | Status: AC
  Administered 2022-11-11: 1000 mL via INTRAVENOUS

## 2022-11-11 NOTE — ED Triage Notes (Addendum)
Pt bib gcems from [facility] for syncopal episode. Staff at facility said that patient had witnessed episode where they were unable to arouse patient for approx 1 minute when he was laying in bed. Pt alert and oriented with ems. Pt denies pain and complains of being tired.   HR 67, BP 108/56, CBG 128

## 2022-11-11 NOTE — Discharge Instructions (Signed)
You have been seen and discharged from the emergency department.  Your blood work showed dehydration, you are given IV fluids.  Your blood work needs to be repeated and your kidney function needs to be reevaluated.  Increase oral hydration.  Follow-up with your primary provider for further evaluation and further care. Take home medications as prescribed. If you have any worsening symptoms or further concerns for your health please return to an emergency department for further evaluation.

## 2022-11-11 NOTE — ED Provider Notes (Signed)
[facility] Provider Note   CSN: [id number] Arrival date & time: 11/11/22  1505     History  Chief Complaint  Patient presents with   Loss of Consciousness    [name] is a 72 y.o. male.  HPI   72 year old male with past medical history of CVA with residual left-sided deficits and other medical history presents the emergency department from [facility] for concern of possible episode of unconsciousness.  Report to EMS is that patient was not able to be aroused while in bed. Staff described him as unconscious, episode of hypoxia down to 88% on RA which resolved when awoke.   EMS states when they arrived at the patient was alert and oriented per baseline and had no complaints.  On my evaluation the patient states that he was sleeping and has been very tired.  He remembers them waking him up.  He does not believe that he passed out.  There is no seizure-like activity, no incontinence, no tongue biting.  Patient health has otherwise been baseline.  Home Medications Prior to Admission medications   Medication Sig Start Date End Date Taking? Authorizing Provider  albuterol (PROVENTIL HFA;VENTOLIN HFA) 108 (90 BASE) MCG/ACT inhaler Inhale 2 puffs into the lungs every 6 (six) hours as needed for wheezing. 10/10/13   Corky Sox, MD  amLODipine (NORVASC) 10 MG tablet Take 1 tablet (10 mg total) by mouth daily. 07/14/16   Hongalgi, Lenis Dickinson, MD  atorvastatin (LIPITOR) 10 MG tablet Take 10 mg by mouth daily at 6 PM.    [provider]  Calcium Carbonate-Vitamin D (CALTRATE 600+D) 600-400 MG-UNIT tablet Take 1 tablet by mouth 2 (two) times daily.    [provider]  chlorhexidine (PERIDEX) 0.12 % solution Use as directed 15 mLs in the mouth or throat daily.    [provider]  cloNIDine (CATAPRES) 0.1 MG tablet Take 0.1 mg by mouth 2 (two) times daily as needed (for SBP >160 or DBP >95).    [provider]   clopidogrel (PLAVIX) 75 MG tablet Take 1 tablet (75 mg total) by mouth daily with breakfast. 06/28/14   Corky Sox, MD  folic acid (FOLVITE) 1 MG tablet Take 1 tablet (1 mg total) by mouth daily. 07/24/14   Corky Sox, MD  levETIRAcetam (KEPPRA) 500 MG tablet Take 1,500 mg by mouth 2 (two) times daily.    [provider]  magnesium oxide (MAG-OX) 400 MG tablet Take 400 mg by mouth daily.    [provider]  Multiple Vitamin (MULTIVITAMIN WITH MINERALS) TABS tablet Take 1 tablet by mouth daily.    [provider]  Multiple Vitamins-Minerals (CERTAGEN PO) Take by mouth.    [provider]  OVER THE COUNTER MEDICATION Take 1 Container by mouth 3 (three) times daily. Magic cup    [provider]  pantoprazole (PROTONIX) 40 MG tablet Take 1 tablet (40 mg total) by mouth daily. 06/28/14   Corky Sox, MD  predniSONE (DELTASONE) 10 MG tablet Take 3 tabs daily for 2 days, then 2 tabs daily for 2 days, then 1 tab daily for 2 days, then stop. 07/14/16   Hongalgi, Lenis Dickinson, MD  senna (SENOKOT) 8.6 MG tablet Take 1 tablet by mouth daily.    [provider]  tiotropium (SPIRIVA HANDIHALER) 18 MCG inhalation capsule Place 1 capsule (18 mcg total) into inhaler and inhale daily. 04/04/14 07/07/16  Corky Sox, MD  Allergies    Other, Aspirin, and Penicillins    Review of Systems   Review of Systems  Constitutional:  Negative for fever.  Respiratory:  Negative for shortness of breath.   Cardiovascular:  Negative for chest pain.  Gastrointestinal:  Negative for abdominal pain.  Musculoskeletal:  Negative for back pain.  Neurological:  Negative for headaches.    Physical Exam Updated Vital Signs BP (!) 142/73   Pulse 64   Temp 97.6 F (36.4 C) (Oral)   Resp (!) 21   Ht 5\' 7"  (1.702 m)   Wt 74.3 kg   SpO2 100%   BMI 25.65 kg/m  Physical Exam Vitals and nursing note reviewed.  Constitutional:      Comments: Right sided deficits  noted, baseline  HENT:     Head: Normocephalic.     Mouth/Throat:     Mouth: Mucous membranes are moist.  Cardiovascular:     Rate and Rhythm: Normal rate.  Pulmonary:     Effort: Pulmonary effort is normal. No respiratory distress.     Breath sounds: No rales.  Abdominal:     Palpations: Abdomen is soft.     Tenderness: There is no abdominal tenderness.  Skin:    General: Skin is warm.  Neurological:     Mental Status: He is alert. Mental status is at baseline.     ED Results / Procedures / Treatments   Labs (all labs ordered are listed, but only abnormal results are displayed) Labs Reviewed  BASIC METABOLIC PANEL - Abnormal; Notable for the following components:      Result Value   Glucose, Bld 121 (*)    BUN 28 (*)    Creatinine, Ser 2.07 (*)    Calcium 11.0 (*)    GFR, Estimated 33 (*)    All other components within normal limits  CBC - Abnormal; Notable for the following components:   RBC 3.07 (*)    Hemoglobin 9.1 (*)    HCT 29.3 (*)    All other components within normal limits  HEPATIC FUNCTION PANEL - Abnormal; Notable for the following components:   Albumin 3.0 (*)    All other components within normal limits  CBG MONITORING, ED - Abnormal; Notable for the following components:   Glucose-Capillary 114 (*)    All other components within normal limits  CULTURE, BLOOD (ROUTINE X 2)  CULTURE, BLOOD (ROUTINE X 2)  URINALYSIS, ROUTINE W REFLEX MICROSCOPIC  AMMONIA  LACTIC ACID, PLASMA  ETHANOL  RAPID URINE DRUG SCREEN, HOSP PERFORMED  I-STAT VENOUS BLOOD GAS, ED  TROPONIN I (HIGH SENSITIVITY)  TROPONIN I (HIGH SENSITIVITY)    EKG EKG Interpretation  Date/Time:  Wednesday November 11 2022 15:12:36 EDT Ventricular Rate:  65 PR Interval:  172 QRS Duration: 114 QT Interval:  454 QTC Calculation: 472 R Axis:   -44 Text Interpretation: Normal sinus rhythm Left axis deviation Right bundle branch block Minimal voltage criteria for LVH, may be normal variant (  R in aVL ) Septal infarct , age undetermined Abnormal ECG When compared with ECG of 07-Jul-2016 20:14, PREVIOUS ECG IS PRESENT Confirmed by Lavenia Atlas [phone]) on 11/11/2022 4:26:05 PM  Radiology CT HEAD WO CONTRAST  Result Date: 11/11/2022 CLINICAL DATA:  Altered mental status EXAM: CT HEAD WITHOUT CONTRAST TECHNIQUE: Contiguous axial images were obtained from the base of the skull through the vertex without intravenous contrast. RADIATION DOSE REDUCTION: This exam was performed according to the departmental dose-optimization program which includes automated exposure control,  adjustment of the mA and/or kV according to patient size and/or use of iterative reconstruction technique. COMPARISON:  07/16/2014 FINDINGS: Brain: No acute intracranial findings are seen. There are no signs of bleeding within the cranium. There is encephalomalacia in the inferior right cerebellum suggesting old infarct with no significant change. Cortical sulci are prominent. There is decreased density in periventricular and subcortical white matter. Vascular: Scattered arterial calcifications are seen. Skull: No fracture is seen in calvarium. There is subcutaneous calcification in the right parietal scalp. Sinuses/Orbits: There is complete opacification of left maxillary antrum. There is defect in the medial wall of the left maxillary sinus. There is soft tissue density in left nasal cavity. There is mucosal thickening in ethmoid sinuses. Other: None. IMPRESSION: No acute intracranial findings are seen. There are no signs of bleeding within the cranium. Old infarct is seen in right cerebellum with no significant change. Atrophy. Small-vessel disease. There is opacification of left maxillary antrum with soft tissue density extending into the left nasal cavity. Findings suggest possible mucocele or chronic sinusitis with polyp arising from the left maxillary sinus extending into the left nasal cavity. Electronically Signed   By: Elmer Picker M.D.   On: 11/11/2022 16:45   DG Chest 2 View  Result Date: 11/11/2022 CLINICAL DATA:  Loss of consciousness.  Syncopal episode. EXAM: CHEST - 2 VIEW COMPARISON:  Chest x-ray 07/07/2016 FINDINGS: Underinflation. There is some bandlike changes at the bases. Atelectasis is favored. No separate consolidation, pneumothorax or effusion. No edema. Mildly enlarged heart. Calcified tortuous ectatic aorta. Overlapping cardiac leads. Kyphotic x-ray on the frontal view obscures the apices. Degenerative changes of the spine. IMPRESSION: Underinflation with presumed basilar atelectasis. Calcified and tortuous aorta.  Mildly enlarged heart. Electronically Signed   By: Jill Side M.D.   On: 11/11/2022 16:30    Procedures Procedures    Medications Ordered in ED Medications - No data to display  ED Course/ Medical Decision Making/ A&P                             Medical Decision Making Amount and/or Complexity of Data Reviewed Labs: ordered.   72 year old male presents emergency department after an episode at the nursing facility.  They describe an episode where they could not wake him up from bed, thought that he was unconscious, possibly hypoxic.  However the patient woke up on his own.  When EMS arrived he was awake, oriented per baseline.  Patient states that has been very tired, that he was simply sleeping.  There is no evidence of seizure-like activity or any other acute illness.  Workup here is baseline.  Only finding is elevated creatinine from baseline.  Patient was hydrated with IV fluids.  No other findings of acute illness, sepsis.  No indication for emergent admission.  Patient is requesting be discharged home.  Patient at this time appears safe and stable for discharge and close outpatient follow up. Discharge plan and strict return to ED precautions discussed, patient verbalizes understanding and agreement.        Final Clinical Impression(s) / ED Diagnoses Final  diagnoses:  None    Rx / DC Orders ED Discharge Orders     None         Lorelle Gibbs, DO 11/11/22 2313

## 2022-11-11 NOTE — ED Provider Triage Note (Signed)
Emergency Medicine Provider Triage Evaluation Note  [name] , a 72 y.o. male  was evaluated in triage.  Pt complains of AMS.  States that he was just sleeping.  I called and spoke the management of nursing facility[facility].  She states that patient was unarousable for at least 10 minutes, oxygen dropped to 85%.  He appeared limp in all of his extremities.  He has a history of sided neuro deficits from previous stroke, metastatic prostate cancer on palliative care, seizures.  Review of Systems  Positive: ams Negative: Neuro deficit  Physical Exam  BP 116/61 (BP Location: Right Arm)   Pulse 65   Temp 97.6 F (36.4 C) (Oral)   Resp 16   Ht 5\' 7"  (1.702 m)   Wt 74.3 kg   SpO2 98%   BMI 25.65 kg/m  Gen:   Awake, no distress   Resp:  Normal effort  MSK:   Moves extremities without difficulty  Other:    Medical Decision Making  Medically screening exam initiated at 4:08 PM.  Appropriate orders placed.  [name] was informed that the remainder of the evaluation will be completed by another provider, this initial triage assessment does not replace that evaluation, and the importance of remaining in the ED until their evaluation is complete.     Margarita Mail, PA-C 11/11/22 1811

## 2022-11-11 NOTE — ED Notes (Signed)
[facility] called for transport back to [facility]. No ETA given

## 2022-11-11 NOTE — ED Notes (Signed)
Patient transported to CT 

## 2022-11-11 NOTE — ED Notes (Signed)
RN introduced self to patient. Pt frustrated, reporting to RN, "I'm fine, I want to go back to [facility]." RN discussed with patient the blood work that was order by PA to further evaluate his condition. Pt adamantly refused for blood to be drawn for a second time thought RN discussed importance of doing so. Pt being transported to CT at this time. RN will attempt lab collection again upon pt return.

## 2022-11-16 LAB — CULTURE, BLOOD (ROUTINE X 2)
Culture: NO GROWTH
Special Requests: ADEQUATE

## 2022-12-14 ENCOUNTER — Non-Acute Institutional Stay: Payer: Medicare Other | Admitting: Hospice

## 2022-12-14 DIAGNOSIS — J209 Acute bronchitis, unspecified: Secondary | ICD-10-CM

## 2022-12-14 DIAGNOSIS — I10 Essential (primary) hypertension: Secondary | ICD-10-CM

## 2022-12-14 DIAGNOSIS — R569 Unspecified convulsions: Secondary | ICD-10-CM

## 2022-12-14 DIAGNOSIS — Z515 Encounter for palliative care: Secondary | ICD-10-CM

## 2022-12-14 DIAGNOSIS — C7951 Secondary malignant neoplasm of bone: Secondary | ICD-10-CM

## 2022-12-14 NOTE — Progress Notes (Signed)
Therapist, nutritional Palliative Care Consult Note Telephone: [phone]  Fax: [phone]  PATIENT NAME: [name] DOB: 25-Apr-1951 MRN: [id number]  PRIMARY CARE PROVIDER  Dr Leander Rams Avegno NO [employer]  REFERRING PROVIDER: Georgann Housekeeper, MD Georgann Housekeeper, MD [address] [place],  [place] [address]  RESPONSIBLE PARTY:   Self  Ronnette Juniper, cousin  [phone] Haze Boyden, cousin [phone] Jeanann Lewandowsky, friend [phone] Contact Information     Name Relation Home Work [place] Relative   [phone]   Graves,Crystal Relative   [phone]   Elnora Morrison [phone]  [phone]   Marcheta Grammes   [phone]       Visit is to build trust and highlight Palliative Medicine as specialized medical care for people living with serious illness, aimed at facilitating better quality of life through symptoms relief, assisting with advance care planning and complex medical decision making. This is a follow up visit.  RECOMMENDATIONS/PLAN:   Advance Care Planning/Code Status: Patient is a Do Not Resuscitate, Do Not Intubate  Goals of Care: Goals of care include to maximize quality of life and symptom management.  Visit consisted of counseling and education dealing with the complex and emotionally intense issues of symptom management and palliative care in the setting of serious and potentially life-threatening illness.    Palliative care team will continue to support patient, patient's family, and medical team.  Symptom management/Plan:  COPD: No exacerbation.  Avoid triggers, smoking cessation.  Continue Tiotropium Bromide Monohydrate  and Albuterol as ordered, no longer using oxygen supplementation.   Seizure: No recent seizure. Continue Keppra.  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 Continue follow up with Oncology as planned.   Hypertension: BP fairly  stable at 120s to 140s systolic.  Managed with metoprolol, amlodipine, Aldactone, doxazosin; clonidine as ordered prn  for blood pressure systolic greater than 160. Continue to monitor BP daily  as ordered.  Monitor for headaches palpitations, chest pain, weakness, dizziness.  Follow up: Palliative care will continue to follow for complex medical decision making, advance care planning, and clarification of goals. Return 6 weeks or prn. Encouraged to call provider sooner with any concerns.  CHIEF COMPLAINT: Palliative follow up  HISTORY OF PRESENT ILLNESS:  [name] a 72 y.o. male with multiple medical problems including COPD, hypertension, seizure disorder.  History of bone cancer, prostate cancer, PVD, Hypomagnesemia. left sided weakness post CVA. Hx of recent COVID 19 infection, not requiring hospitalization. Patient denies headaches, difficulty breathing, no pain/discomfort; compliant with plan of care,except refusing to stop smoking. Will continue to provide education on the need to stop. Patient was seen in the ED last month for dehydration, not admitted. History obtained from review of EMR, discussion with primary team, family and/or patient. Records reviewed and summarized above.  All 10 point systems reviewed and are negative except as documented in history of present illness above  Review and summarization of Epic records shows history from other than patient.   Palliative Care was asked to follow this patient o help address complex decision making in the context of advance care planning and goals of care clarification.   PERTINENT MEDICATIONS:  Outpatient Encounter Medications as of 12/14/2022  Medication Sig   albuterol (PROVENTIL HFA;VENTOLIN HFA) 108 (90 BASE) MCG/ACT inhaler Inhale 2 puffs into the lungs every 6 (six) hours as needed for wheezing.   amLODipine (NORVASC) 10 MG tablet Take 1 tablet (10 mg total) by mouth daily.  atorvastatin (LIPITOR) 10 MG tablet Take 10 mg by  mouth daily at 6 PM.   Calcium Carbonate-Vitamin D (CALTRATE 600+D) 600-400 MG-UNIT tablet Take 1 tablet by mouth 2 (two) times daily.   chlorhexidine (PERIDEX) 0.12 % solution Use as directed 15 mLs in the mouth or throat daily.   cloNIDine (CATAPRES) 0.1 MG tablet Take 0.1 mg by mouth 2 (two) times daily as needed (for SBP >160 or DBP >95).   clopidogrel (PLAVIX) 75 MG tablet Take 1 tablet (75 mg total) by mouth daily with breakfast.   folic acid (FOLVITE) 1 MG tablet Take 1 tablet (1 mg total) by mouth daily.   levETIRAcetam (KEPPRA) 500 MG tablet Take 1,500 mg by mouth 2 (two) times daily.   magnesium oxide (MAG-OX) 400 MG tablet Take 400 mg by mouth daily.   Multiple Vitamin (MULTIVITAMIN WITH MINERALS) TABS tablet Take 1 tablet by mouth daily.   Multiple Vitamins-Minerals (CERTAGEN PO) Take by mouth.   OVER THE COUNTER MEDICATION Take 1 Container by mouth 3 (three) times daily. Magic cup   pantoprazole (PROTONIX) 40 MG tablet Take 1 tablet (40 mg total) by mouth daily.   predniSONE (DELTASONE) 10 MG tablet Take 3 tabs daily for 2 days, then 2 tabs daily for 2 days, then 1 tab daily for 2 days, then stop.   senna (SENOKOT) 8.6 MG tablet Take 1 tablet by mouth daily.   tiotropium (SPIRIVA HANDIHALER) 18 MCG inhalation capsule Place 1 capsule (18 mcg total) into inhaler and inhale daily.   No facility-administered encounter medications on file as of 12/14/2022.    HOSPICE ELIGIBILITY/DIAGNOSIS: TBD  PAST MEDICAL HISTORY:  Past Medical History:  Diagnosis Date   Cerebral embolism with cerebral infarction (HCC)    residual left sided weakness/notes 12/12/2013   Chronic kidney disease (CKD), stage II (mild)    Hattie Perch 12/12/2013   Colon polyps 10/20/2013   TUBULAR ADENOMA (4).   COPD 05/18/2007   Elevated PSA 05/25/2007   High cholesterol    Hypertension 05/18/2007   Pneumonia    "couple times" (07/08/2016)   Prostate cancer (HCC)    Protein-calorie malnutrition, severe (HCC)    Hattie Perch  12/12/2013   Salmonella 2017   Seizures (HCC) 12/05/2013; 12/08/2013; 12/11/2013   new onset; recurrent/notes 12/12/2013   Sepsis associated hypotension (HCC) 07/08/2016   Hattie Perch 07/08/2016   Stroke (HCC) 10/14/2010, 11/2012   Right centrum semiovale   Stroke (HCC)    "I've had about 5" (07/08/2016)      ALLERGIES:  Allergies  Allergen Reactions   Other Other (See Comments)    Epidural caused seizure   Aspirin Hives   Penicillins Hives      I spent 35 minutes providing this consultation; this includes time spent with patient/family, chart review and documentation. More than 50% of the time in this consultation was spent on counseling and coordinating communication   Thank you for the opportunity to participate in the care of [name] Please call our office at [phone] if we can be of additional assistance.  Note: Portions of this note were generated with Scientist, clinical (histocompatibility and immunogenetics). Dictation errors may occur despite best attempts at proofreading.  Rosaura Carpenter, NP

## 2022-12-30 ENCOUNTER — Other Ambulatory Visit (HOSPITAL_COMMUNITY): Payer: Self-pay | Admitting: Urology

## 2022-12-30 DIAGNOSIS — C7951 Secondary malignant neoplasm of bone: Secondary | ICD-10-CM

## 2022-12-30 DIAGNOSIS — C61 Malignant neoplasm of prostate: Secondary | ICD-10-CM

## 2022-12-31 ENCOUNTER — Non-Acute Institutional Stay: Payer: Medicare Other | Admitting: Hospice

## 2022-12-31 DIAGNOSIS — Z515 Encounter for palliative care: Secondary | ICD-10-CM

## 2022-12-31 DIAGNOSIS — I10 Essential (primary) hypertension: Secondary | ICD-10-CM

## 2022-12-31 DIAGNOSIS — C7951 Secondary malignant neoplasm of bone: Secondary | ICD-10-CM

## 2022-12-31 DIAGNOSIS — R569 Unspecified convulsions: Secondary | ICD-10-CM

## 2022-12-31 DIAGNOSIS — J209 Acute bronchitis, unspecified: Secondary | ICD-10-CM

## 2022-12-31 NOTE — Progress Notes (Signed)
Therapist, nutritional Palliative Care Consult Note Telephone: [phone]  Fax: [phone]  PATIENT NAME: [name] DOB: April 02, 1951 MRN: [id number]  PRIMARY CARE PROVIDER  Dr Leander Rams Avegno NO [employer]  REFERRING PROVIDER: Georgann Housekeeper, MD Georgann Housekeeper, MD [address] [place],  [place] [address]  RESPONSIBLE PARTY:   Self  Ronnette Juniper, cousin  [phone] Haze Boyden, cousin [phone] Jeanann Lewandowsky, friend [phone] Contact Information     Name Relation Home Work [place] Relative   [phone]   Graves,Crystal Relative   [phone]   Elnora Morrison [phone]  [phone]   Marcheta Grammes   [phone]       Visit is to build trust and highlight Palliative Medicine as specialized medical care for people living with serious illness, aimed at facilitating better quality of life through symptoms relief, assisting with advance care planning and complex medical decision making. This is a follow up visit.  RECOMMENDATIONS/PLAN:   Advance Care Planning/Code Status: Patient is a Do Not Resuscitate, Do Not Intubate  Goals of Care: Goals of care include to maximize quality of life and symptom management.  Visit consisted of counseling and education dealing with the complex and emotionally intense issues of symptom management and palliative care in the setting of serious and potentially life-threatening illness.    Palliative care team will continue to support patient, patient's family, and medical team.  Symptom management/Plan:  COPD: Not oxygen dependent, No exacerbation since last visit.  Avoid triggers, smoking cessation.  Continue Tiotropium Bromide Monohydrate  and Albuterol as ordered, no longer using oxygen supplementation.   Seizure: No recent seizure. Continue Keppra.  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 Continue follow up with Oncology as  planned.   Hypertension: Stable. Continue metoprolol, amlodipine, Aldactone, doxazosin; clonidine as ordered prn  for blood pressure systolic greater than 160. Continue to monitor BP daily  as ordered.  Monitor for headaches palpitations, chest pain, weakness, dizziness.  Follow up: Palliative care will continue to follow for complex medical decision making, advance care planning, and clarification of goals. Return 6 weeks or prn. Encouraged to call provider sooner with any concerns.  CHIEF COMPLAINT: Palliative follow up  HISTORY OF PRESENT ILLNESS:  [name] a 72 y.o. male with multiple medical problems including COPD, hypertension, seizure disorder.  History of bone cancer, prostate cancer, PVD, Hypomagnesemia. left sided weakness post CVA. Hx of recent COVID 19 infection, not requiring hospitalization. Patient denies headaches, difficulty breathing, no pain/discomfort; compliant with plan of care,except refusing to stop smoking. Will continue to provide education on the need to stop.  Patient was seen in the ED last month for dehydration, not admitted. He reports drinking more fluids, overall doing well. Nursing with no concerns today.  History obtained from review of EMR, discussion with primary team, family and/or patient. Records reviewed and summarized above.  All 10 point systems reviewed and are negative except as documented in history of present illness above  Review and summarization of Epic records shows history from other than patient.   Palliative Care was asked to follow this patient o help address complex decision making in the context of advance care planning and goals of care clarification.   PERTINENT MEDICATIONS:  Outpatient Encounter Medications as of 12/31/2022  Medication Sig   albuterol (PROVENTIL HFA;VENTOLIN HFA) 108 (90 BASE) MCG/ACT inhaler Inhale 2 puffs into the lungs every 6 (six) hours as needed for wheezing.   amLODipine (  NORVASC) 10 MG tablet Take 1 tablet  (10 mg total) by mouth daily.   atorvastatin (LIPITOR) 10 MG tablet Take 10 mg by mouth daily at 6 PM.   Calcium Carbonate-Vitamin D (CALTRATE 600+D) 600-400 MG-UNIT tablet Take 1 tablet by mouth 2 (two) times daily.   chlorhexidine (PERIDEX) 0.12 % solution Use as directed 15 mLs in the mouth or throat daily.   cloNIDine (CATAPRES) 0.1 MG tablet Take 0.1 mg by mouth 2 (two) times daily as needed (for SBP >160 or DBP >95).   clopidogrel (PLAVIX) 75 MG tablet Take 1 tablet (75 mg total) by mouth daily with breakfast.   folic acid (FOLVITE) 1 MG tablet Take 1 tablet (1 mg total) by mouth daily.   levETIRAcetam (KEPPRA) 500 MG tablet Take 1,500 mg by mouth 2 (two) times daily.   magnesium oxide (MAG-OX) 400 MG tablet Take 400 mg by mouth daily.   Multiple Vitamin (MULTIVITAMIN WITH MINERALS) TABS tablet Take 1 tablet by mouth daily.   Multiple Vitamins-Minerals (CERTAGEN PO) Take by mouth.   OVER THE COUNTER MEDICATION Take 1 Container by mouth 3 (three) times daily. Magic cup   pantoprazole (PROTONIX) 40 MG tablet Take 1 tablet (40 mg total) by mouth daily.   predniSONE (DELTASONE) 10 MG tablet Take 3 tabs daily for 2 days, then 2 tabs daily for 2 days, then 1 tab daily for 2 days, then stop.   senna (SENOKOT) 8.6 MG tablet Take 1 tablet by mouth daily.   tiotropium (SPIRIVA HANDIHALER) 18 MCG inhalation capsule Place 1 capsule (18 mcg total) into inhaler and inhale daily.   No facility-administered encounter medications on file as of 12/31/2022.    HOSPICE ELIGIBILITY/DIAGNOSIS: TBD  PAST MEDICAL HISTORY:  Past Medical History:  Diagnosis Date   Cerebral embolism with cerebral infarction (HCC)    residual left sided weakness/notes 12/12/2013   Chronic kidney disease (CKD), stage II (mild)    Hattie Perch 12/12/2013   Colon polyps 10/20/2013   TUBULAR ADENOMA (4).   COPD 05/18/2007   Elevated PSA 05/25/2007   High cholesterol    Hypertension 05/18/2007   Pneumonia    "couple times" (07/08/2016)    Prostate cancer (HCC)    Protein-calorie malnutrition, severe (HCC)    Hattie Perch 12/12/2013   Salmonella 2017   Seizures (HCC) 12/05/2013; 12/08/2013; 12/11/2013   new onset; recurrent/notes 12/12/2013   Sepsis associated hypotension (HCC) 07/08/2016   Hattie Perch 07/08/2016   Stroke (HCC) 10/14/2010, 11/2012   Right centrum semiovale   Stroke (HCC)    "I've had about 5" (07/08/2016)      ALLERGIES:  Allergies  Allergen Reactions   Other Other (See Comments)    Epidural caused seizure   Aspirin Hives   Penicillins Hives      I spent 35 minutes providing this consultation; this includes time spent with patient/family, chart review and documentation. More than 50% of the time in this consultation was spent on counseling and coordinating communication   Thank you for the opportunity to participate in the care of [name] Please call our office at [phone] if we can be of additional assistance.  Note: Portions of this note were generated with Scientist, clinical (histocompatibility and immunogenetics). Dictation errors may occur despite best attempts at proofreading.  Rosaura Carpenter, NP

## 2023-01-27 ENCOUNTER — Non-Acute Institutional Stay: Payer: Medicare Other | Admitting: Hospice

## 2023-01-27 DIAGNOSIS — J440 Chronic obstructive pulmonary disease with acute lower respiratory infection: Secondary | ICD-10-CM

## 2023-01-27 DIAGNOSIS — Z515 Encounter for palliative care: Secondary | ICD-10-CM

## 2023-01-27 DIAGNOSIS — C61 Malignant neoplasm of prostate: Secondary | ICD-10-CM

## 2023-01-27 DIAGNOSIS — R569 Unspecified convulsions: Secondary | ICD-10-CM

## 2023-01-27 DIAGNOSIS — I10 Essential (primary) hypertension: Secondary | ICD-10-CM

## 2023-01-27 NOTE — Progress Notes (Signed)
Therapist, nutritional Palliative Care Consult Note Telephone: [phone]  Fax: [phone]  PATIENT NAME: [name] DOB: 05-Sep-1950 MRN: [id number]  PRIMARY CARE PROVIDER  Dr Leander Rams Avegno NO [employer]  REFERRING PROVIDER: Georgann Housekeeper, MD Georgann Housekeeper, MD [address] [place],  [place] [address]  RESPONSIBLE PARTY:   Self  Ronnette Juniper, cousin  [phone] Haze Boyden, cousin [phone] Jeanann Lewandowsky, friend [phone] Contact Information     Name Relation Home Work [place] Relative   [phone]   Graves,Crystal Relative   [phone]   Elnora Morrison [phone]  [phone]   Marcheta Grammes   [phone]       Visit is to build trust and highlight Palliative Medicine as specialized medical care for people living with serious illness, aimed at facilitating better quality of life through symptoms relief, assisting with advance care planning and complex medical decision making. This is a follow up visit.  RECOMMENDATIONS/PLAN:   Advance Care Planning/Code Status: Patient is a Do Not Resuscitate, Do Not Intubate  Goals of Care: Goals of care include to maximize quality of life and symptom management.  Visit consisted of counseling and education dealing with the complex and emotionally intense issues of symptom management and palliative care in the setting of serious and potentially life-threatening illness.    Palliative care team will continue to support patient, patient's family, and medical team.  Symptom management/Plan:  COPD: Continue Tiotropium Bromide Monohydrate  and Albuterol as ordered, no longer using oxygen supplementation.  Smoking cessation.  Seizure: No recent seizure. Continue Keppra.   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 Continue follow up with Oncology as planned.   Hypertension: Continue metoprolol, amlodipine, Aldactone,  doxazosin; clonidine as ordered prn  for blood pressure systolic greater than 160. Continue to monitor BP daily  as ordered.  Monitor for headaches palpitations, chest pain, weakness, dizziness.  Follow up: Palliative care will continue to follow for complex medical decision making, advance care planning, and clarification of goals. Return 6 weeks or prn. Encouraged to call provider sooner with any concerns.  CHIEF COMPLAINT: Palliative follow up  HISTORY OF PRESENT ILLNESS:  [name] a 72 y.o. male with multiple morbidities requiring close monitoring and with high risk of complications and  mortality: COPD, hypertension, seizure disorder.  History of bone cancer, prostate cancer, PVD, Hypomagnesemia. left sided weakness post CVA. Hx of recent COVID 19 infection. Patient denies headaches, difficulty breathing, no pain/discomfort; compliant with plan of care,except refusing to stop smoking. Will continue to provide education on the need to stop.  Patient was seen in the ED last month for dehydration, not admitted. He reports drinking more fluids, overall doing well. Nursing with no concerns today.  History obtained from review of EMR, discussion with primary team, family and/or patient. Records reviewed and summarized above.  All 10 point systems reviewed and are negative except as documented in history of present illness above  Review and summarization of Epic records shows history from other than patient.   Palliative Care was asked to follow this patient o help address complex decision making in the context of advance care planning and goals of care clarification.   PERTINENT MEDICATIONS:  Outpatient Encounter Medications as of 12/31/2022  Medication Sig   albuterol (PROVENTIL HFA;VENTOLIN HFA) 108 (90 BASE) MCG/ACT inhaler Inhale 2 puffs into the lungs every 6 (six) hours as needed for wheezing.   amLODipine (NORVASC) 10 MG tablet Take  1 tablet (10 mg total) by mouth daily.   atorvastatin  (LIPITOR) 10 MG tablet Take 10 mg by mouth daily at 6 PM.   Calcium Carbonate-Vitamin D (CALTRATE 600+D) 600-400 MG-UNIT tablet Take 1 tablet by mouth 2 (two) times daily.   chlorhexidine (PERIDEX) 0.12 % solution Use as directed 15 mLs in the mouth or throat daily.   cloNIDine (CATAPRES) 0.1 MG tablet Take 0.1 mg by mouth 2 (two) times daily as needed (for SBP >160 or DBP >95).   clopidogrel (PLAVIX) 75 MG tablet Take 1 tablet (75 mg total) by mouth daily with breakfast.   folic acid (FOLVITE) 1 MG tablet Take 1 tablet (1 mg total) by mouth daily.   levETIRAcetam (KEPPRA) 500 MG tablet Take 1,500 mg by mouth 2 (two) times daily.   magnesium oxide (MAG-OX) 400 MG tablet Take 400 mg by mouth daily.   Multiple Vitamin (MULTIVITAMIN WITH MINERALS) TABS tablet Take 1 tablet by mouth daily.   Multiple Vitamins-Minerals (CERTAGEN PO) Take by mouth.   OVER THE COUNTER MEDICATION Take 1 Container by mouth 3 (three) times daily. Magic cup   pantoprazole (PROTONIX) 40 MG tablet Take 1 tablet (40 mg total) by mouth daily.   predniSONE (DELTASONE) 10 MG tablet Take 3 tabs daily for 2 days, then 2 tabs daily for 2 days, then 1 tab daily for 2 days, then stop.   senna (SENOKOT) 8.6 MG tablet Take 1 tablet by mouth daily.   tiotropium (SPIRIVA HANDIHALER) 18 MCG inhalation capsule Place 1 capsule (18 mcg total) into inhaler and inhale daily.   No facility-administered encounter medications on file as of 12/31/2022.    HOSPICE ELIGIBILITY/DIAGNOSIS: TBD  PAST MEDICAL HISTORY:  Past Medical History:  Diagnosis Date   Cerebral embolism with cerebral infarction (HCC)    residual left sided weakness/notes 12/12/2013   Chronic kidney disease (CKD), stage II (mild)    Hattie Perch 12/12/2013   Colon polyps 10/20/2013   TUBULAR ADENOMA (4).   COPD 05/18/2007   Elevated PSA 05/25/2007   High cholesterol    Hypertension 05/18/2007   Pneumonia    "couple times" (07/08/2016)   Prostate cancer (HCC)    Protein-calorie  malnutrition, severe (HCC)    Hattie Perch 12/12/2013   Salmonella 2017   Seizures (HCC) 12/05/2013; 12/08/2013; 12/11/2013   new onset; recurrent/notes 12/12/2013   Sepsis associated hypotension (HCC) 07/08/2016   Hattie Perch 07/08/2016   Stroke (HCC) 10/14/2010, 11/2012   Right centrum semiovale   Stroke (HCC)    "I've had about 5" (07/08/2016)      ALLERGIES:  Allergies  Allergen Reactions   Other Other (See Comments)    Epidural caused seizure   Aspirin Hives   Penicillins Hives      I spent 35 minutes providing this consultation; this includes time spent with patient/family, chart review and documentation. More than 50% of the time in this consultation was spent on counseling and coordinating communication   Thank you for the opportunity to participate in the care of BRETTLEY NIEVES Please call our office at [phone] if we can be of additional assistance.  Note: Portions of this note were generated with Scientist, clinical (histocompatibility and immunogenetics). Dictation errors may occur despite best attempts at proofreading.  Rosaura Carpenter, NP

## 2023-03-11 DEATH — deceased

## 2023-03-17 ENCOUNTER — Other Ambulatory Visit (HOSPITAL_COMMUNITY): Payer: Medicare Other

## 2023-03-17 ENCOUNTER — Encounter (HOSPITAL_COMMUNITY): Payer: Self-pay

## 2023-03-19 ENCOUNTER — Other Ambulatory Visit (HOSPITAL_COMMUNITY): Payer: Medicare Other
